# Patient Record
Sex: Female | Born: 1960 | Race: White | Hispanic: Yes | Marital: Married | Smoking: Never smoker
Health system: Southern US, Community
[De-identification: ages and names within clinical notes are randomized; demographics above are authoritative.]

## PROBLEM LIST (undated history)

## (undated) DIAGNOSIS — M51379 Other intervertebral disc degeneration, lumbosacral region without mention of lumbar back pain or lower extremity pain: Secondary | ICD-10-CM

## (undated) DIAGNOSIS — I10 Essential (primary) hypertension: Secondary | ICD-10-CM

## (undated) DIAGNOSIS — G43909 Migraine, unspecified, not intractable, without status migrainosus: Secondary | ICD-10-CM

## (undated) DIAGNOSIS — L570 Actinic keratosis: Secondary | ICD-10-CM

## (undated) DIAGNOSIS — N200 Calculus of kidney: Secondary | ICD-10-CM

## (undated) DIAGNOSIS — M810 Age-related osteoporosis without current pathological fracture: Secondary | ICD-10-CM

## (undated) DIAGNOSIS — R112 Nausea with vomiting, unspecified: Secondary | ICD-10-CM

## (undated) DIAGNOSIS — E559 Vitamin D deficiency, unspecified: Secondary | ICD-10-CM

## (undated) DIAGNOSIS — F419 Anxiety disorder, unspecified: Secondary | ICD-10-CM

## (undated) DIAGNOSIS — R519 Headache, unspecified: Secondary | ICD-10-CM

## (undated) DIAGNOSIS — E785 Hyperlipidemia, unspecified: Secondary | ICD-10-CM

## (undated) DIAGNOSIS — I499 Cardiac arrhythmia, unspecified: Secondary | ICD-10-CM

## (undated) DIAGNOSIS — N201 Calculus of ureter: Secondary | ICD-10-CM

## (undated) DIAGNOSIS — Z9889 Other specified postprocedural states: Secondary | ICD-10-CM

## (undated) DIAGNOSIS — I890 Lymphedema, not elsewhere classified: Secondary | ICD-10-CM

## (undated) DIAGNOSIS — C801 Malignant (primary) neoplasm, unspecified: Secondary | ICD-10-CM

## (undated) DIAGNOSIS — I872 Venous insufficiency (chronic) (peripheral): Secondary | ICD-10-CM

## (undated) DIAGNOSIS — T8859XA Other complications of anesthesia, initial encounter: Secondary | ICD-10-CM

## (undated) DIAGNOSIS — E538 Deficiency of other specified B group vitamins: Secondary | ICD-10-CM

## (undated) DIAGNOSIS — Z87442 Personal history of urinary calculi: Secondary | ICD-10-CM

## (undated) HISTORY — PX: AUGMENTATION MAMMAPLASTY: SUR837

## (undated) HISTORY — PX: EXTRACORPOREAL SHOCK WAVE LITHOTRIPSY: SHX1557

## (undated) HISTORY — PX: URETEROSCOPY WITH HOLMIUM LASER LITHOTRIPSY: SHX6645

## (undated) HISTORY — PX: FACIAL COSMETIC SURGERY: SHX629

## (undated) HISTORY — PX: LIPOSUCTION: SHX10

## (undated) HISTORY — PX: TONSILLECTOMY: SUR1361

## (undated) HISTORY — PX: PLACEMENT OF BREAST IMPLANTS: SHX6334

## (undated) HISTORY — DX: Calculus of kidney: N20.0

## (undated) HISTORY — PX: OTHER SURGICAL HISTORY: SHX169

## (undated) HISTORY — PX: CYSTOSCOPY/URETEROSCOPY/HOLMIUM LASER/STENT PLACEMENT: SHX6546

## (undated) HISTORY — DX: Actinic keratosis: L57.0

---

## 2005-02-08 DIAGNOSIS — M5137 Other intervertebral disc degeneration, lumbosacral region: Secondary | ICD-10-CM | POA: Insufficient documentation

## 2013-04-09 DIAGNOSIS — M810 Age-related osteoporosis without current pathological fracture: Secondary | ICD-10-CM | POA: Insufficient documentation

## 2013-04-09 DIAGNOSIS — E559 Vitamin D deficiency, unspecified: Secondary | ICD-10-CM | POA: Insufficient documentation

## 2019-02-25 DIAGNOSIS — G43901 Migraine, unspecified, not intractable, with status migrainosus: Secondary | ICD-10-CM | POA: Insufficient documentation

## 2020-07-09 ENCOUNTER — Other Ambulatory Visit: Payer: Self-pay

## 2020-07-09 ENCOUNTER — Ambulatory Visit
Admission: RE | Admit: 2020-07-09 | Discharge: 2020-07-09 | Disposition: A | Discharge status: Home / Self Care | Payer: 59 | Source: Ambulatory Visit | Attending: Urology | Admitting: Urology

## 2020-07-09 ENCOUNTER — Ambulatory Visit (INDEPENDENT_AMBULATORY_CARE_PROVIDER_SITE_OTHER): Payer: Managed Care, Other (non HMO) | Admitting: Urology

## 2020-07-09 ENCOUNTER — Encounter: Payer: Self-pay | Admitting: Urology

## 2020-07-09 VITALS — BP 170/90 | HR 89 | Ht 62.0 in | Wt 123.0 lb

## 2020-07-09 DIAGNOSIS — R1031 Right lower quadrant pain: Secondary | ICD-10-CM | POA: Diagnosis not present

## 2020-07-09 DIAGNOSIS — Z87442 Personal history of urinary calculi: Secondary | ICD-10-CM

## 2020-07-09 DIAGNOSIS — N132 Hydronephrosis with renal and ureteral calculous obstruction: Secondary | ICD-10-CM | POA: Diagnosis not present

## 2020-07-09 DIAGNOSIS — N200 Calculus of kidney: Secondary | ICD-10-CM | POA: Diagnosis not present

## 2020-07-09 NOTE — Progress Notes (Signed)
07/09/20 4:34 PM   Ashley Collins 10-08-1960 [id number]  CC: Nephrolithiasis  HPI: I saw Ashley Collins and her husband in urology clinic today for kidney stones.  She is a 60 year old female that has been followed extensively in [place] for recurrent stone disease.  She reportedly has collected numerous 24-hour urines that did not show any specific abnormalities.  She thinks her stone type has been calcium.  She has undergone both ureteroscopy and shockwave lithotripsy in the past, most recently right shockwave lithotripsy for distal ureteral stones in July 2021 in [place].  She has a CT with her today from August 2021 that shows persistent collection of right distal ureteral stones with upstream hydronephrosis.  She denies that she has had any treatment since that CT was performed, and has not sought care as she was between insurance coverage and recently moved to the area.  There are no labs available.  She reports moderate to severe pelvic pressure that has worsened over the last few weeks, and has become intolerable.  She denies any dysuria, flank pain, gross hematuria, or UTIs.  She denies any fevers or chills.  She continues to void.  Urinalysis today 6-10 WBCs, 11-30 RBCs, moderate bacteria, no yeast, nitrate negative, no leukocytes.Marland Kitchen   PMH: Past Medical History:  Diagnosis Date  . Kidney stones     Surgical History: Past Surgical History:  Procedure Laterality Date  . CESAREAN SECTION     x 4  . CYSTOSCOPY/URETEROSCOPY/HOLMIUM LASER/STENT PLACEMENT    . EXTRACORPOREAL SHOCK WAVE LITHOTRIPSY     x 10 plus  . Eye Lift    . FACIAL COSMETIC SURGERY    . LIPOSUCTION    . PLACEMENT OF BREAST IMPLANTS    . TONSILLECTOMY    . URETEROSCOPY WITH HOLMIUM LASER LITHOTRIPSY      Family History: Family History  Problem Relation Age of Onset  . Bladder Cancer Neg Hx   . Kidney cancer Neg Hx   . Prostate cancer Neg Hx     Social History:  reports that she has never smoked. She has  never used smokeless tobacco. No history on file for alcohol use and drug use.  Physical Exam: BP (!) 170/90   Pulse 89   Ht 5\' 2"  (1.575 m)   Wt 123 lb (55.8 kg)   BMI 22.50 kg/m    Constitutional:  Alert and oriented, No acute distress. Cardiovascular: No clubbing, cyanosis, or edema. Respiratory: Normal respiratory effort, no increased work of breathing. GI: Abdomen is soft, nontender, nondistended, no abdominal masses  Laboratory Data: See HPI  Pertinent Imaging: I have personally viewed and interpreted the CT from August 2021 from the outside hospital that shows right hydronephrosis and a collection of two 75mm stones in the right mid to distal ureter, no right renal stones, and punctate left renal stones with no hydronephrosis.  Stat CT today shows collection of right distal ureteral stones with upstream hydronephrosis, as well as a 5 mm left distal ureteral stone with no hydronephrosis, and a 6 mm left lower pole stone.  Assessment & Plan:   60 year old female with extensive history of nephrolithiasis, with most recent imaging in August 2021 showing two 24mm right mid to distal ureteral stones with upstream hydronephrosis.  She continues to be symptomatic with moderate to severe pelvic pressure.  I recommended repeating the CT today for evaluation of any change over the last 4 months, and she was in agreement.  CT today shows collection of right distal ureteral  stones with upstream hydronephrosis, as well as a 5 mm left distal ureteral stone with no hydronephrosis, and a 6 mm left lower pole stone.  I called her today after CT had resulted and discussed the findings of bilateral ureteral stones with right-sided hydronephrosis.  I recommended intervention tomorrow with bilateral ureteroscopy, laser lithotripsy, and stent placement.  We discussed possible need for temporary stent placement and delayed definitive treatment if there is any evidence of purulence or infection.  We  specifically discussed the risks ureteroscopy including bleeding, infection/sepsis, stent related symptoms including flank pain/urgency/frequency/incontinence/dysuria, ureteral injury, inability to access stone, or need for staged or additional procedures.  Add onto OR tomorrow for bilateral ureteroscopy, laser lithotripsy, stent placement  I spent 65 total minutes on the day of the encounter including pre-visit review of the medical record, face-to-face time with the patient, and post visit ordering of labs/imaging/tests.   Nickolas Madrid, MD 07/09/2020  [facility] [address] [place], [place] [address] [phone]

## 2020-07-09 NOTE — H&P (View-Only) (Signed)
 07/09/20 4:34 PM   Ashley Collins 03/24/1961 [id number]  CC: Nephrolithiasis  HPI: I saw Ashley Collins and her husband in urology clinic today for kidney stones.  She is a 59-year-old female that has been followed extensively in [place] for recurrent stone disease.  She reportedly has collected numerous 24-hour urines that did not show any specific abnormalities.  She thinks her stone type has been calcium.  She has undergone both ureteroscopy and shockwave lithotripsy in the past, most recently right shockwave lithotripsy for distal ureteral stones in July 2021 in [place].  She has a CT with her today from August 2021 that shows persistent collection of right distal ureteral stones with upstream hydronephrosis.  She denies that she has had any treatment since that CT was performed, and has not sought care as she was between insurance coverage and recently moved to the area.  There are no labs available.  She reports moderate to severe pelvic pressure that has worsened over the last few weeks, and has become intolerable.  She denies any dysuria, flank pain, gross hematuria, or UTIs.  She denies any fevers or chills.  She continues to void.  Urinalysis today 6-10 WBCs, 11-30 RBCs, moderate bacteria, no yeast, nitrate negative, no leukocytes..   PMH: Past Medical History:  Diagnosis Date  . Kidney stones     Surgical History: Past Surgical History:  Procedure Laterality Date  . CESAREAN SECTION     x 4  . CYSTOSCOPY/URETEROSCOPY/HOLMIUM LASER/STENT PLACEMENT    . EXTRACORPOREAL SHOCK WAVE LITHOTRIPSY     x 10 plus  . Eye Lift    . FACIAL COSMETIC SURGERY    . LIPOSUCTION    . PLACEMENT OF BREAST IMPLANTS    . TONSILLECTOMY    . URETEROSCOPY WITH HOLMIUM LASER LITHOTRIPSY      Family History: Family History  Problem Relation Age of Onset  . Bladder Cancer Neg Hx   . Kidney cancer Neg Hx   . Prostate cancer Neg Hx     Social History:  reports that she has never smoked. She has  never used smokeless tobacco. No history on file for alcohol use and drug use.  Physical Exam: BP (!) 170/90   Pulse 89   Ht 5' 2" (1.575 m)   Wt 123 lb (55.8 kg)   BMI 22.50 kg/m    Constitutional:  Alert and oriented, No acute distress. Cardiovascular: No clubbing, cyanosis, or edema. Respiratory: Normal respiratory effort, no increased work of breathing. GI: Abdomen is soft, nontender, nondistended, no abdominal masses  Laboratory Data: See HPI  Pertinent Imaging: I have personally viewed and interpreted the CT from August 2021 from the outside hospital that shows right hydronephrosis and a collection of two 6mm stones in the right mid to distal ureter, no right renal stones, and punctate left renal stones with no hydronephrosis.  Stat CT today shows collection of right distal ureteral stones with upstream hydronephrosis, as well as a 5 mm left distal ureteral stone with no hydronephrosis, and a 6 mm left lower pole stone.  Assessment & Plan:   59-year-old female with extensive history of nephrolithiasis, with most recent imaging in August 2021 showing two 6mm right mid to distal ureteral stones with upstream hydronephrosis.  She continues to be symptomatic with moderate to severe pelvic pressure.  I recommended repeating the CT today for evaluation of any change over the last 4 months, and she was in agreement.  CT today shows collection of right distal ureteral   stones with upstream hydronephrosis, as well as a 5 mm left distal ureteral stone with no hydronephrosis, and a 6 mm left lower pole stone.  I called her today after CT had resulted and discussed the findings of bilateral ureteral stones with right-sided hydronephrosis.  I recommended intervention tomorrow with bilateral ureteroscopy, laser lithotripsy, and stent placement.  We discussed possible need for temporary stent placement and delayed definitive treatment if there is any evidence of purulence or infection.  We  specifically discussed the risks ureteroscopy including bleeding, infection/sepsis, stent related symptoms including flank pain/urgency/frequency/incontinence/dysuria, ureteral injury, inability to access stone, or need for staged or additional procedures.  Add onto OR tomorrow for bilateral ureteroscopy, laser lithotripsy, stent placement  I spent 65 total minutes on the day of the encounter including pre-visit review of the medical record, face-to-face time with the patient, and post visit ordering of labs/imaging/tests.   Nickolas Madrid, MD 07/09/2020  [facility] [address] [place], [place] [address] [phone]

## 2020-07-10 ENCOUNTER — Ambulatory Visit: Payer: 59

## 2020-07-10 ENCOUNTER — Encounter
Admission: RE | Disposition: A | Discharge status: Home / Self Care | Payer: Self-pay | Source: Home / Self Care | Attending: Urology

## 2020-07-10 ENCOUNTER — Other Ambulatory Visit: Payer: Self-pay

## 2020-07-10 ENCOUNTER — Telehealth: Payer: Self-pay | Admitting: Urology

## 2020-07-10 ENCOUNTER — Ambulatory Visit
Admission: RE | Admit: 2020-07-10 | Discharge: 2020-07-10 | Disposition: A | Discharge status: Home / Self Care | Payer: 59 | Attending: Urology | Admitting: Urology

## 2020-07-10 ENCOUNTER — Ambulatory Visit: Payer: 59 | Admitting: Anesthesiology

## 2020-07-10 ENCOUNTER — Encounter: Payer: Self-pay | Admitting: Urology

## 2020-07-10 ENCOUNTER — Other Ambulatory Visit: Payer: Self-pay | Admitting: Urology

## 2020-07-10 ENCOUNTER — Other Ambulatory Visit
Admission: RE | Admit: 2020-07-10 | Discharge: 2020-07-10 | Disposition: A | Discharge status: Home / Self Care | Payer: 59 | Source: Ambulatory Visit | Attending: Urology | Admitting: Urology

## 2020-07-10 DIAGNOSIS — N132 Hydronephrosis with renal and ureteral calculous obstruction: Secondary | ICD-10-CM | POA: Diagnosis present

## 2020-07-10 DIAGNOSIS — Z20822 Contact with and (suspected) exposure to covid-19: Secondary | ICD-10-CM | POA: Insufficient documentation

## 2020-07-10 DIAGNOSIS — N200 Calculus of kidney: Secondary | ICD-10-CM | POA: Diagnosis not present

## 2020-07-10 DIAGNOSIS — Z87442 Personal history of urinary calculi: Secondary | ICD-10-CM | POA: Insufficient documentation

## 2020-07-10 DIAGNOSIS — Z01812 Encounter for preprocedural laboratory examination: Secondary | ICD-10-CM | POA: Insufficient documentation

## 2020-07-10 DIAGNOSIS — N201 Calculus of ureter: Secondary | ICD-10-CM | POA: Diagnosis not present

## 2020-07-10 HISTORY — PX: CYSTOSCOPY W/ RETROGRADES: SHX1426

## 2020-07-10 HISTORY — DX: Other complications of anesthesia, initial encounter: T88.59XA

## 2020-07-10 HISTORY — PX: CYSTOSCOPY/URETEROSCOPY/HOLMIUM LASER/STENT PLACEMENT: SHX6546

## 2020-07-10 LAB — URINALYSIS, COMPLETE
Bilirubin, UA: NEGATIVE
Glucose, UA: NEGATIVE
Ketones, UA: NEGATIVE
Leukocytes,UA: NEGATIVE
Nitrite, UA: NEGATIVE
Specific Gravity, UA: 1.025 (ref 1.005–1.030)
Urobilinogen, Ur: 0.2 mg/dL (ref 0.2–1.0)
pH, UA: 6.5 (ref 5.0–7.5)

## 2020-07-10 LAB — SARS CORONAVIRUS 2 BY RT PCR (HOSPITAL ORDER, PERFORMED IN ~~LOC~~ HOSPITAL LAB): SARS Coronavirus 2: NEGATIVE

## 2020-07-10 LAB — BASIC METABOLIC PANEL
Anion gap: 11 (ref 5–15)
BUN: 12 mg/dL (ref 8–23)
CO2: 27 mmol/L (ref 22–32)
Calcium: 9 mg/dL (ref 8.9–10.3)
Chloride: 103 mmol/L (ref 98–111)
Creatinine, Ser: 0.71 mg/dL (ref 0.44–1.00)
GFR, Estimated: >60 mL/min (ref >60)
Glucose, Bld: 111 mg/dL — ABNORMAL HIGH (ref 70–99)
Potassium: 3.6 mmol/L (ref 3.5–5.1)
Sodium: 141 mmol/L (ref 135–145)

## 2020-07-10 LAB — MICROSCOPIC EXAMINATION

## 2020-07-10 LAB — PARATHYROID HORMONE, INTRAOP (ARMC ONLY): Parathyroid Hormone: 83 pg/mL (ref 12–88)

## 2020-07-10 SURGERY — CYSTOSCOPY/URETEROSCOPY/HOLMIUM LASER/STENT PLACEMENT
Anesthesia: General | Site: Ureter | Laterality: Bilateral

## 2020-07-10 MED ORDER — MIDAZOLAM HCL 2 MG/2ML IJ SOLN
INTRAMUSCULAR | Status: AC
  Filled 2020-07-10: qty 2

## 2020-07-10 MED ORDER — CHLORHEXIDINE GLUCONATE 0.12 % MT SOLN
OROMUCOSAL | Status: AC
  Administered 2020-07-10: 15 mL via OROMUCOSAL
  Filled 2020-07-10: qty 15

## 2020-07-10 MED ORDER — PROPOFOL 500 MG/50ML IV EMUL
INTRAVENOUS | Status: DC | PRN
  Administered 2020-07-10: 175 ug/kg/min via INTRAVENOUS

## 2020-07-10 MED ORDER — PROPOFOL 500 MG/50ML IV EMUL
INTRAVENOUS | Status: AC
  Filled 2020-07-10: qty 50

## 2020-07-10 MED ORDER — DROPERIDOL 2.5 MG/ML IJ SOLN
0.6250 mg | Freq: Once | INTRAMUSCULAR | Status: DC | PRN
  Filled 2020-07-10: qty 2

## 2020-07-10 MED ORDER — FENTANYL CITRATE (PF) 100 MCG/2ML IJ SOLN
INTRAMUSCULAR | Status: DC | PRN
  Administered 2020-07-10: 50 ug via INTRAVENOUS

## 2020-07-10 MED ORDER — IOHEXOL 180 MG/ML  SOLN
INTRAMUSCULAR | Status: DC | PRN
  Administered 2020-07-10: 20 mL

## 2020-07-10 MED ORDER — LORAZEPAM 2 MG/ML IJ SOLN: 1.0000 mg | Freq: Once | INTRAMUSCULAR | Status: DC | PRN

## 2020-07-10 MED ORDER — MEPERIDINE HCL 50 MG/ML IJ SOLN: 6.2500 mg | INTRAMUSCULAR | Status: DC | PRN

## 2020-07-10 MED ORDER — PROPOFOL 10 MG/ML IV BOLUS
INTRAVENOUS | Status: DC | PRN
  Administered 2020-07-10: 160 mg via INTRAVENOUS
  Administered 2020-07-10: 20 mg via INTRAVENOUS

## 2020-07-10 MED ORDER — PROMETHAZINE HCL 25 MG/ML IJ SOLN: 6.2500 mg | INTRAMUSCULAR | Status: DC | PRN

## 2020-07-10 MED ORDER — HYDROMORPHONE HCL 1 MG/ML IJ SOLN: 0.2500 mg | INTRAMUSCULAR | Status: DC | PRN

## 2020-07-10 MED ORDER — LIDOCAINE HCL (CARDIAC) PF 100 MG/5ML IV SOSY
PREFILLED_SYRINGE | INTRAVENOUS | Status: DC | PRN
  Administered 2020-07-10: 100 mg via INTRAVENOUS

## 2020-07-10 MED ORDER — BELLADONNA ALKALOIDS-OPIUM 16.2-60 MG RE SUPP
RECTAL | Status: AC
  Filled 2020-07-10: qty 1

## 2020-07-10 MED ORDER — OXYBUTYNIN CHLORIDE ER 10 MG PO TB24: 10.0000 mg | ORAL_TABLET | Freq: Every day | ORAL | 0 refills | Status: AC | PRN

## 2020-07-10 MED ORDER — EPHEDRINE 5 MG/ML INJ
INTRAVENOUS | Status: AC
  Filled 2020-07-10: qty 10

## 2020-07-10 MED ORDER — CEFAZOLIN SODIUM-DEXTROSE 2-4 GM/100ML-% IV SOLN
INTRAVENOUS | Status: AC
  Filled 2020-07-10: qty 100

## 2020-07-10 MED ORDER — CHLORHEXIDINE GLUCONATE 0.12 % MT SOLN: 15.0000 mL | Freq: Once | OROMUCOSAL | Status: AC

## 2020-07-10 MED ORDER — DEXAMETHASONE SODIUM PHOSPHATE 10 MG/ML IJ SOLN
INTRAMUSCULAR | Status: DC | PRN
  Administered 2020-07-10: 10 mg via INTRAVENOUS

## 2020-07-10 MED ORDER — ONDANSETRON HCL 4 MG/2ML IJ SOLN
INTRAMUSCULAR | Status: DC | PRN
Start: 2020-07-10 — End: 2020-07-10
  Administered 2020-07-10: 4 mg via INTRAVENOUS

## 2020-07-10 MED ORDER — PHENYLEPHRINE HCL (PRESSORS) 10 MG/ML IV SOLN
INTRAVENOUS | Status: DC | PRN
  Administered 2020-07-10: 50 ug via INTRAVENOUS

## 2020-07-10 MED ORDER — ORAL CARE MOUTH RINSE: 15.0000 mL | Freq: Once | OROMUCOSAL | Status: AC

## 2020-07-10 MED ORDER — LIDOCAINE HCL URETHRAL/MUCOSAL 2 % EX GEL
CUTANEOUS | Status: AC
  Filled 2020-07-10: qty 10

## 2020-07-10 MED ORDER — ROCURONIUM BROMIDE 100 MG/10ML IV SOLN
INTRAVENOUS | Status: DC | PRN
  Administered 2020-07-10: 40 mg via INTRAVENOUS
  Administered 2020-07-10: 10 mg via INTRAVENOUS

## 2020-07-10 MED ORDER — HYDROCODONE-ACETAMINOPHEN 5-325 MG PO TABS: 1.0000 | ORAL_TABLET | ORAL | 0 refills | Status: AC | PRN

## 2020-07-10 MED ORDER — CEFAZOLIN SODIUM-DEXTROSE 2-4 GM/100ML-% IV SOLN
2.0000 g | INTRAVENOUS | Status: AC
  Administered 2020-07-10: 2 g via INTRAVENOUS

## 2020-07-10 MED ORDER — LACTATED RINGERS IV SOLN: INTRAVENOUS | Status: DC

## 2020-07-10 MED ORDER — SUCCINYLCHOLINE CHLORIDE 20 MG/ML IJ SOLN
INTRAMUSCULAR | Status: DC | PRN
  Administered 2020-07-10: 100 mg via INTRAVENOUS

## 2020-07-10 MED ORDER — FENTANYL CITRATE (PF) 100 MCG/2ML IJ SOLN
INTRAMUSCULAR | Status: AC
  Filled 2020-07-10: qty 2

## 2020-07-10 MED ORDER — SULFAMETHOXAZOLE-TRIMETHOPRIM 800-160 MG PO TABS: 1.0000 | ORAL_TABLET | Freq: Every day | ORAL | 0 refills | Status: DC

## 2020-07-10 MED ORDER — EPHEDRINE SULFATE 50 MG/ML IJ SOLN
INTRAMUSCULAR | Status: DC | PRN
  Administered 2020-07-10 (×2): 10 mg via INTRAVENOUS

## 2020-07-10 SURGICAL SUPPLY — 38 items
BAG DRAIN CYSTO-URO LG1000N (MISCELLANEOUS) ×2 IMPLANT
BASKET ZERO TIP 1.9FR (BASKET) ×2 IMPLANT
BRUSH SCRUB EZ 1% IODOPHOR (MISCELLANEOUS) ×2 IMPLANT
BSKT STON RTRVL ZERO TP 1.9FR (BASKET) ×1
CATH URETL 5X70 OPEN END (CATHETERS) IMPLANT
CNTNR SPEC 2.5X3XGRAD LEK (MISCELLANEOUS)
CONT SPEC 4OZ STER OR WHT (MISCELLANEOUS)
CONT SPEC 4OZ STRL OR WHT (MISCELLANEOUS)
CONTAINER SPEC 2.5X3XGRAD LEK (MISCELLANEOUS) IMPLANT
DRAPE UTILITY 15X26 TOWEL STRL (DRAPES) ×2 IMPLANT
DRSG TEGADERM 2-3/8X2-3/4 SM (GAUZE/BANDAGES/DRESSINGS) ×2 IMPLANT
GLIDEWIRE STR 0.035 150CM 3CM (WIRE) ×2 IMPLANT
GLOVE BIOGEL PI IND STRL 7.5 (GLOVE) ×1 IMPLANT
GLOVE BIOGEL PI INDICATOR 7.5 (GLOVE) ×1
GOWN STRL REUS W/ TWL LRG LVL3 (GOWN DISPOSABLE) ×1 IMPLANT
GOWN STRL REUS W/ TWL XL LVL3 (GOWN DISPOSABLE) ×1 IMPLANT
GOWN STRL REUS W/TWL LRG LVL3 (GOWN DISPOSABLE) ×2
GOWN STRL REUS W/TWL XL LVL3 (GOWN DISPOSABLE) ×2
GUIDEWIRE STR DUAL SENSOR (WIRE) ×2 IMPLANT
INFUSOR MANOMETER BAG 3000ML (MISCELLANEOUS) ×2 IMPLANT
INTRODUCER DILATOR DOUBLE (INTRODUCER) IMPLANT
KIT TURNOVER CYSTO (KITS) ×2 IMPLANT
PACK CYSTO AR (MISCELLANEOUS) ×2 IMPLANT
SCOPE LITHOVU DISP 9.5FR 7.7FR (UROLOGICAL SUPPLIES) ×1 IMPLANT
SCOPE LITHOVUE DISPOSABLE (UROLOGICAL SUPPLIES) ×2
SET CYSTO W/LG BORE CLAMP LF (SET/KITS/TRAYS/PACK) ×2 IMPLANT
SHEATH URETERAL 12FRX35CM (MISCELLANEOUS) IMPLANT
SOL .9 NS 3000ML IRR  AL (IV SOLUTION) ×1
SOL .9 NS 3000ML IRR AL (IV SOLUTION) ×1
SOL .9 NS 3000ML IRR UROMATIC (IV SOLUTION) ×1 IMPLANT
STENT URET 6FRX22 CONTOUR (STENTS) ×2 IMPLANT
STENT URET 6FRX24 CONTOUR (STENTS) ×2 IMPLANT
STENT URET 6FRX26 CONTOUR (STENTS) IMPLANT
SURGILUBE 2OZ TUBE FLIPTOP (MISCELLANEOUS) ×2 IMPLANT
SYR 10ML LL (SYRINGE) ×2 IMPLANT
TRACTIP FLEXIVA PULSE ID 200 (Laser) IMPLANT
VALVE UROSEAL ADJ ENDO (VALVE) IMPLANT
WATER STERILE IRR 1000ML POUR (IV SOLUTION) ×2 IMPLANT

## 2020-07-10 NOTE — Interval H&P Note (Signed)
UROLOGY H&P UPDATE  Agree with prior H&P dated 07/09/20.  Cardiac: RRR Lungs: CTA bilaterally  Laterality: Bilateral Procedure: Bilateral ureteroscopy, laser lithotripsy, stent placement  We specifically discussed the risks ureteroscopy including bleeding, infection/sepsis, stent related symptoms including flank pain/urgency/frequency/incontinence/dysuria, ureteral injury, inability to access stone, or need for staged or additional procedures.   Billey Co, MD 07/10/2020

## 2020-07-10 NOTE — Op Note (Signed)
Date of procedure: 07/10/20  Preoperative diagnosis:  1. Right distal ureteral stones 2. Right renal stones 3. Left distal ureteral stone  Postoperative diagnosis:  1. Same  Procedure: 1. Cystoscopy 2. Right ureteroscopy, laser lithotripsy and basket extraction of right distal ureteral stones 3. Right retrograde pyelogram with intraoperative interpretation, right ureteral stent placement 4. Left ureteroscopy, laser lithotripsy and basket extraction of left distal ureteral stones 5. Left ureteroscopy, laser lithotripsy of left renal stones 6. Left retrograde pyelogram with intraoperative interpretation, left ureteral stent placement with Dangler  Surgeon: Nickolas Madrid, MD  Anesthesia: General  Complications: None  Intraoperative findings:  1.  Multiple impacted right distal ureteral stones with significant ureteral edema, all fragmented and basket extracted, uncomplicated right ureteral stent placement 2.  Left distal ureteral stones fragmented and extracted, left renal stones dusted, left ureteral stent placed on Dangler 3.  Normal cystoscopy  EBL: Minimal  Specimens: Stone for analysis  Drains: Right 6 French by 22 cm ureteral stent, left 6 [place] by 24 cm ureteral stent on Dangler  Indication: Ashley Collins is a 60 y.o. patient with pelvic pressure who has had right distal ureteral stones on CT since at least August 2021, and CT last night confirmed right distal ureteral stones with hydronephrosis as well as a 5 mm left distal ureteral stone and left renal stone.  After reviewing the management options for treatment, they elected to proceed with the above surgical procedure(s). We have discussed the potential benefits and risks of the procedure, side effects of the proposed treatment, the likelihood of the patient achieving the goals of the procedure, and any potential problems that might occur during the procedure or recuperation. Informed consent has been  obtained.  Description of procedure:  The patient was taken to the operating room and general anesthesia was induced. SCDs were placed for DVT prophylaxis. The patient was placed in the dorsal lithotomy position, prepped and draped in the usual sterile fashion, and preoperative antibiotics(Ancef) were administered. A preoperative time-out was performed.   A 21 French rigid cystoscope was used to intubate the urethra and thorough cystoscopy was performed.  The bladder was grossly normal and the ureteral orifices were orthotopic bilaterally.  With the aid of an access catheter, I was ultimately able to navigate a sensor wire into the right ureteral orifice and alongside the multiple impacted right distal ureteral stones up into the kidney.  A semirigid ureteroscope was advanced alongside the wire and there was significant distal ureteral edema with three impacted yellow crystalline stones.  A 242 m laser fiber on settings of 0.8 J and 8 Hz was used to carefully fragment the stones, and all fragments were basket extracted.  This was sent for analysis.  Thorough inspection of the right ureter revealed no other fragments.  There was significant distal ureteral edema where the stone had been impacted.  A retrograde pyelogram showed no extravasation or filling defects.  The rigid cystoscope was backloaded over the wire and a 6 [place] by 22 cm ureteral stent was uneventfully placed with an excellent curl in the renal pelvis, as well as in the bladder.  I turned my attention to the left side and a sensor wire was able to be advanced alongside the left distal ureteral stones up into the left kidney.  A semirigid ureteroscope was advanced alongside the wire and identified 2 yellow stones in the left distal ureter.  These were fragmented into smaller pieces on previously mentioned the laser settings, and also basket extracted.  Thorough inspection of the left ureter showed no other stone fragments.  A second safety  wire was added through the semirigid scope.    A disposable ureteroscope was advanced over the wire up to the left kidney under fluoroscopic vision.  Thorough inspection of the kidney revealed a 79mm stone in the lower pole, and a 2 mm stone in the upper pole.  These were both fragmented to dust on settings of 0.5 J and 40 Hz.  Thorough pyeloscopy revealed no other fragments.  Contrast was injected from the proximal ureter and showed no extravasation or filling defects.  Careful pullback ureteroscopy demonstrated no residual fragments or ureteral injury.  The rigid cystoscope was again backloaded over the wire, and a 6 [place] by 24 cm ureteral stent was uneventfully placed with a Curl in the renal pelvis, as well as under direct vision the bladder.  The bladder was drained.  The left ureteral stent dangler was secured to the groin using Tegaderm.  Lido-jet was injected into the bladder, and a belladonna suppository was placed.  Disposition: Stable to PACU  Plan: Follow-up BMP and PTH, stone analysis Remove left ureteral stent on Dangler at home on 1/19 Right ureteral stent will be removed in clinic in 10 to 14 days secondary to significant ureteral edema from impacted stones  Nickolas Madrid, MD

## 2020-07-10 NOTE — Telephone Encounter (Signed)
-----   Message from Billey Co, MD sent at 07/10/2020  1:21 PM EST ----- Regarding: Stent removal Please schedule stent removal on 1/26 or 1/27, thanks  Nickolas Madrid, MD 07/10/2020

## 2020-07-10 NOTE — Anesthesia Preprocedure Evaluation (Signed)
Anesthesia Evaluation  Patient identified by MRN, date of birth, ID band Patient awake    Reviewed: Allergy & Precautions, H&P , NPO status , Patient's Chart, lab work & pertinent test results  Airway Mallampati: III       Dental no notable dental hx. (+) Teeth Intact   Pulmonary neg pulmonary ROS,    Pulmonary exam normal        Cardiovascular negative cardio ROS Normal cardiovascular exam     Neuro/Psych negative neurological ROS  negative psych ROS   GI/Hepatic negative GI ROS, Neg liver ROS,   Endo/Other  negative endocrine ROS  Renal/GU negative Renal ROS  negative genitourinary   Musculoskeletal negative musculoskeletal ROS (+)   Abdominal   Peds negative pediatric ROS (+)  Hematology negative hematology ROS (+)   Anesthesia Other Findings Past Medical History: No date: Kidney stones   Reproductive/Obstetrics negative OB ROS                             Anesthesia Physical Anesthesia Plan  ASA: II  Anesthesia Plan: General   Post-op Pain Management:    Induction: Intravenous  PONV Risk Score and Plan: 3 and Ondansetron and Dexamethasone  Airway Management Planned: Oral ETT  Additional Equipment:   Intra-op Plan:   Post-operative Plan:   Informed Consent: I have reviewed the patients History and Physical, chart, labs and discussed the procedure including the risks, benefits and alternatives for the proposed anesthesia with the patient or authorized representative who has indicated his/her understanding and acceptance.     Dental advisory given  Plan Discussed with: CRNA, Anesthesiologist and Surgeon  Anesthesia Plan Comments:         Anesthesia Quick Evaluation

## 2020-07-10 NOTE — Telephone Encounter (Signed)
App made Gave to [facility] in [place]

## 2020-07-10 NOTE — Anesthesia Postprocedure Evaluation (Signed)
Anesthesia Post Note  Patient: Ashley Collins  Procedure(s) Performed: CYSTOSCOPY/URETEROSCOPY/HOLMIUM LASER/STENT PLACEMENT (Bilateral Ureter) CYSTOSCOPY WITH RETROGRADE PYELOGRAM (Bilateral Ureter)  Patient location during evaluation: PACU Anesthesia Type: General Level of consciousness: awake Pain management: pain level controlled Vital Signs Assessment: post-procedure vital signs reviewed and stable Respiratory status: spontaneous breathing Cardiovascular status: stable Postop Assessment: no apparent nausea or vomiting Anesthetic complications: no   No complications documented.   Last Vitals:  Vitals:   07/10/20 1350 07/10/20 1405  BP: (!) 151/69 (!) 148/75  Pulse: 87 82  Resp: 11 12  Temp:    SpO2: 96% 98%    Last Pain:  Vitals:   07/10/20 1405  TempSrc:   PainSc: 0-No pain                 Neva Seat

## 2020-07-10 NOTE — Transfer of Care (Signed)
Immediate Anesthesia Transfer of Care Note  Patient: Ashley Collins  Procedure(s) Performed: CYSTOSCOPY/URETEROSCOPY/HOLMIUM LASER/STENT PLACEMENT (Bilateral Ureter) CYSTOSCOPY WITH RETROGRADE PYELOGRAM (Bilateral Ureter)  Patient Location: PACU  Anesthesia Type:General  Level of Consciousness: sedated  Airway & Oxygen Therapy: Patient Spontanous Breathing and Patient connected to face mask oxygen  Post-op Assessment: Report given to RN and Post -op Vital signs reviewed and stable  Post vital signs: Reviewed and stable  Last Vitals:  Vitals Value Taken Time  BP 165/82 07/10/20 1320  Temp 36.1 C 07/10/20 1320  Pulse 92 07/10/20 1327  Resp 14 07/10/20 1327  SpO2 99 % 07/10/20 1327  Vitals shown include unvalidated device data.  Last Pain:  Vitals:   07/10/20 1320  TempSrc:   PainSc: Asleep         Complications: No complications documented.

## 2020-07-10 NOTE — Anesthesia Procedure Notes (Signed)
Procedure Name: Intubation Date/Time: 07/10/2020 12:36 PM Performed by: Nelda Marseille, CRNA Pre-anesthesia Checklist: Patient identified, Patient being monitored, Timeout performed, Emergency Drugs available and Suction available Patient Re-evaluated:Patient Re-evaluated prior to induction Oxygen Delivery Method: Circle system utilized Preoxygenation: Pre-oxygenation with 100% oxygen Induction Type: IV induction Ventilation: Mask ventilation without difficulty Laryngoscope Size: Mac, 3 and McGraph Grade View: Grade I Tube type: Oral Tube size: 7.0 mm Number of attempts: 1 Airway Equipment and Method: Stylet and Video-laryngoscopy Placement Confirmation: ETT inserted through vocal cords under direct vision,  positive ETCO2 and breath sounds checked- equal and bilateral Secured at: 21 cm Tube secured with: Tape Dental Injury: Teeth and Oropharynx as per pre-operative assessment

## 2020-07-10 NOTE — Discharge Instructions (Signed)

## 2020-07-11 ENCOUNTER — Other Ambulatory Visit (INDEPENDENT_AMBULATORY_CARE_PROVIDER_SITE_OTHER): Payer: Self-pay

## 2020-07-14 ENCOUNTER — Other Ambulatory Visit: Payer: Self-pay | Admitting: Urology

## 2020-07-14 LAB — CULTURE, URINE COMPREHENSIVE

## 2020-07-14 MED ORDER — HYDROCODONE-ACETAMINOPHEN 5-325 MG PO TABS: 1.0000 | ORAL_TABLET | Freq: Four times a day (QID) | ORAL | 0 refills | Status: AC | PRN

## 2020-07-14 NOTE — Telephone Encounter (Signed)
Done. Please encourage her to use NSAIDs and tylenol and minimize narcotics for stent pain. She should continue to daily flomax, and use the oxybutynin as well  Nickolas Madrid, MD 07/14/2020

## 2020-07-15 LAB — CALCULI, WITH PHOTOGRAPH (CLINICAL LAB)
Calcium Oxalate Dihydrate: 80 %
Calcium Oxalate Monohydrate: 20 %
Weight Calculi: 4 mg

## 2020-07-22 ENCOUNTER — Encounter: Payer: Managed Care, Other (non HMO) | Admitting: Urology

## 2020-07-23 ENCOUNTER — Other Ambulatory Visit: Payer: Self-pay

## 2020-07-23 ENCOUNTER — Ambulatory Visit (INDEPENDENT_AMBULATORY_CARE_PROVIDER_SITE_OTHER): Payer: Managed Care, Other (non HMO) | Admitting: Urology

## 2020-07-23 ENCOUNTER — Encounter: Payer: Self-pay | Admitting: Urology

## 2020-07-23 VITALS — BP 131/78 | HR 75 | Ht 62.0 in | Wt 120.0 lb

## 2020-07-23 DIAGNOSIS — N200 Calculus of kidney: Secondary | ICD-10-CM

## 2020-07-23 DIAGNOSIS — Z87442 Personal history of urinary calculi: Secondary | ICD-10-CM

## 2020-07-23 DIAGNOSIS — Z466 Encounter for fitting and adjustment of urinary device: Secondary | ICD-10-CM | POA: Diagnosis not present

## 2020-07-23 MED ORDER — FLUCONAZOLE 100 MG PO TABS: 100.0000 mg | ORAL_TABLET | Freq: Every day | ORAL | 0 refills | Status: DC

## 2020-07-23 MED ORDER — CIPROFLOXACIN HCL 500 MG PO TABS: 500.0000 mg | ORAL_TABLET | Freq: Two times a day (BID) | ORAL | 0 refills | Status: DC

## 2020-07-23 MED ORDER — CIPROFLOXACIN HCL 500 MG PO TABS
500.0000 mg | ORAL_TABLET | Freq: Once | ORAL | Status: AC
  Administered 2020-07-23: 500 mg via ORAL

## 2020-07-23 NOTE — Progress Notes (Signed)
Cystoscopy Procedure Note:  Indication: Stent removal s/p 07/10/2020 bilateral ureteroscopy for bilateral distal ureteral and renal stones.  Right-sided distal ureteral stones had been present for at least 5 months.  She removed her left-sided stent on a Dangler last week.  She denies any dysuria, fevers, or UTI symptoms.  Has been on prophylactic Bactrim.  After informed consent and discussion of the procedure and its risks, Ashley Collins was positioned and prepped in the standard fashion. Cystoscopy was performed with a flexible cystoscope. The stent was grasped with flexible graspers and removed in its entirety. The patient tolerated the procedure well.  Findings: Uncomplicated stent removal  Assessment and Plan: Follow up in 4 weeks with renal ultrasound to evaluate for silent hydronephrosis Cipro 500 mg twice daily x3 days for equivocal urinalysis today, sent for culture  Prior 24 hr urine testing reportedly normal, consider K+ citrate for prevention at follow up   Billey Co, MD 07/23/2020

## 2020-07-23 NOTE — Patient Instructions (Signed)

## 2020-07-24 LAB — URINALYSIS, COMPLETE
Bilirubin, UA: NEGATIVE
Glucose, UA: NEGATIVE
Ketones, UA: NEGATIVE
Nitrite, UA: POSITIVE — AB
Specific Gravity, UA: 1.025 (ref 1.005–1.030)
Urobilinogen, Ur: 1 mg/dL (ref 0.2–1.0)
pH, UA: 6 (ref 5.0–7.5)

## 2020-07-24 LAB — MICROSCOPIC EXAMINATION: RBC, Urine: >30 /hpf — AB (ref 0–2)

## 2020-07-30 LAB — CULTURE, URINE COMPREHENSIVE

## 2020-08-03 DIAGNOSIS — M542 Cervicalgia: Secondary | ICD-10-CM | POA: Diagnosis not present

## 2020-08-03 DIAGNOSIS — M25512 Pain in left shoulder: Secondary | ICD-10-CM | POA: Diagnosis not present

## 2020-08-03 DIAGNOSIS — M7552 Bursitis of left shoulder: Secondary | ICD-10-CM | POA: Diagnosis not present

## 2020-08-03 DIAGNOSIS — M7542 Impingement syndrome of left shoulder: Secondary | ICD-10-CM | POA: Diagnosis not present

## 2020-08-04 ENCOUNTER — Telehealth: Payer: Self-pay

## 2020-08-04 NOTE — Telephone Encounter (Signed)
Called pt, no answer. LM for pt informing her of the information below. Advised pt to call back for questions or concerns.

## 2020-08-04 NOTE — Telephone Encounter (Signed)
-----   Message from Billey Co, MD sent at 08/03/2020 11:21 AM EST ----- No UTI on recent urine culture, keep follow up as scheduled  Nickolas Madrid, MD 08/03/2020

## 2020-08-06 DIAGNOSIS — R0600 Dyspnea, unspecified: Secondary | ICD-10-CM | POA: Diagnosis not present

## 2020-08-06 DIAGNOSIS — R030 Elevated blood-pressure reading, without diagnosis of hypertension: Secondary | ICD-10-CM | POA: Diagnosis not present

## 2020-08-06 DIAGNOSIS — I499 Cardiac arrhythmia, unspecified: Secondary | ICD-10-CM | POA: Diagnosis not present

## 2020-08-06 DIAGNOSIS — R9431 Abnormal electrocardiogram [ECG] [EKG]: Secondary | ICD-10-CM | POA: Diagnosis not present

## 2020-08-06 DIAGNOSIS — Z23 Encounter for immunization: Secondary | ICD-10-CM | POA: Diagnosis not present

## 2020-08-06 DIAGNOSIS — Z8679 Personal history of other diseases of the circulatory system: Secondary | ICD-10-CM | POA: Diagnosis not present

## 2020-08-13 DIAGNOSIS — M542 Cervicalgia: Secondary | ICD-10-CM | POA: Diagnosis not present

## 2020-08-20 DIAGNOSIS — M542 Cervicalgia: Secondary | ICD-10-CM | POA: Diagnosis not present

## 2020-08-24 ENCOUNTER — Other Ambulatory Visit: Payer: Self-pay

## 2020-08-24 ENCOUNTER — Ambulatory Visit
Admission: RE | Admit: 2020-08-24 | Discharge: 2020-08-24 | Disposition: A | Discharge status: Home / Self Care | Payer: 59 | Source: Ambulatory Visit | Attending: Urology | Admitting: Urology

## 2020-08-24 DIAGNOSIS — N1330 Unspecified hydronephrosis: Secondary | ICD-10-CM | POA: Diagnosis not present

## 2020-08-24 DIAGNOSIS — N200 Calculus of kidney: Secondary | ICD-10-CM | POA: Diagnosis not present

## 2020-08-24 DIAGNOSIS — Z87442 Personal history of urinary calculi: Secondary | ICD-10-CM | POA: Insufficient documentation

## 2020-08-25 ENCOUNTER — Other Ambulatory Visit: Payer: Self-pay | Admitting: Obstetrics and Gynecology

## 2020-08-25 DIAGNOSIS — Z01419 Encounter for gynecological examination (general) (routine) without abnormal findings: Secondary | ICD-10-CM | POA: Diagnosis not present

## 2020-08-25 DIAGNOSIS — Z1231 Encounter for screening mammogram for malignant neoplasm of breast: Secondary | ICD-10-CM | POA: Diagnosis not present

## 2020-08-25 DIAGNOSIS — M542 Cervicalgia: Secondary | ICD-10-CM | POA: Diagnosis not present

## 2020-08-27 ENCOUNTER — Ambulatory Visit: Payer: Managed Care, Other (non HMO) | Admitting: Urology

## 2020-08-27 ENCOUNTER — Other Ambulatory Visit: Payer: Self-pay

## 2020-08-27 ENCOUNTER — Encounter: Payer: Self-pay | Admitting: Urology

## 2020-08-27 VITALS — BP 158/105 | HR 87 | Ht 62.0 in | Wt 125.0 lb

## 2020-08-27 DIAGNOSIS — Z87442 Personal history of urinary calculi: Secondary | ICD-10-CM | POA: Diagnosis not present

## 2020-08-27 DIAGNOSIS — M542 Cervicalgia: Secondary | ICD-10-CM | POA: Diagnosis not present

## 2020-08-27 DIAGNOSIS — N200 Calculus of kidney: Secondary | ICD-10-CM | POA: Diagnosis not present

## 2020-08-27 NOTE — Progress Notes (Signed)
   08/27/2020 4:05 PM   [address] 1960/11/15 [id number]  Reason for visit: Follow up recurrent nephrolithiasis  HPI: I saw Ms. Sebring back in clinic for follow-up of recurrent calcium oxalate nephrolithiasis.  She is a 60 year old female who recently moved to the area and has a very long history of recurrent stone disease.  She underwent bilateral ureteroscopy on 07/10/2020 for multiple impacted right ureteral stones that have been present at least 4 to 5 months, as well as small left distal ureteral stones and left renal stones.  Her stents have since been removed and she is doing well with no flank pain or urinary complaints.  A follow-up renal ultrasound showed mild right hydronephrosis, but no other significant abnormalities, which is not surprising with her history of obstruction for 4 to 5 months prior to definitive treatment.  I recommended repeating a renal ultrasound to confirm resolution or at least stability of the mild right-sided hydronephrosis. We discussed general stone prevention strategies including adequate hydration with goal of producing 2.5 L of urine daily, increasing citric acid intake, increasing calcium intake during high oxalate meals, minimizing animal protein, and decreasing salt intake. Information about dietary recommendations given today.  I also recommended a 24-hour urine test, and she is amenable to completing this.  -Repeat renal ultrasound in 6 weeks, and 24-hour urine test, will call with those results -Consider potassium citrate pending above findings for stone prevention, likely KUB at least every 6 months for surveillance  Billey Co, MD  [place] [address], [place], [place] [address] [phone]

## 2020-08-27 NOTE — Patient Instructions (Signed)
[facility] Instructions [facility]   You will receive a box/kit in the mail that will have a urine jug and instructions in the kit.  When the box arrives you will need to call our office [phone] to schedule a LAB appointment.   You will need to do a 24hour urine and this should be done during the days that our office will be open.  For example any day from Sunday through Thursday.   If you take Vitamin C 100mg or greater please stop this 5 days prior to collection.   How to collect the urine sample: On the day you start the urine sample this 1st morning urine should NOT be collected.  For the rest of the day including all night urines should be collected.  On the next morning the 1st urine should be collected and then you will be finished with the urine collections.   You will need to bring the box with you on your LAB appointment day after urine has been collected and all instructions are complete in the box.  Your blood will be drawn and the box will be collected by our Lab employee to be sent off for analysis.   When urine and blood is complete you will need to schedule a follow up appointment for lab results.  Dietary Guidelines to Help Prevent Kidney Stones Kidney stones are deposits of minerals and salts that form inside your kidneys. Your risk of developing kidney stones may be greater depending on your diet, your lifestyle, the medicines you take, and whether you have certain medical conditions. Most people can lower their chances of developing kidney stones by following the instructions below. Your dietitian may give you more specific instructions depending on your overall health and the type of kidney stones you tend to develop. What are tips for following this plan? Reading food labels  Choose foods with "no salt added" or "low-salt" labels. Limit your salt (sodium) intake to less than 1,500 mg a day.  Choose foods with calcium for each meal and snack. Try  to eat about 300 mg of calcium at each meal. Foods that contain 200-500 mg of calcium a serving include: ? 8 oz (237 mL) of milk, calcium-fortifiednon-dairy milk, and calcium-fortifiedfruit juice. Calcium-fortified means that calcium has been added to these drinks. ? 8 oz (237 mL) of kefir, yogurt, and soy yogurt. ? 4 oz (114 g) of tofu. ? 1 oz (28 g) of cheese. ? 1 cup (150 g) of dried figs. ? 1 cup (91 g) of cooked broccoli. ? One 3 oz (85 g) can of sardines or mackerel. Most people need 1,000-1,500 mg of calcium a day. Talk to your dietitian about how much calcium is recommended for you.   Shopping  Buy plenty of fresh fruits and vegetables. Most people do not need to avoid fruits and vegetables, even if these foods contain nutrients that may contribute to kidney stones.  When shopping for convenience foods, choose: ? Whole pieces of fruit. ? Pre-made salads with dressing on the side. ? Low-fat fruit and yogurt smoothies.  Avoid buying frozen meals or prepared deli foods. These can be high in sodium.  Look for foods with live cultures, such as yogurt and kefir.  Choose high-fiber grains, such as whole-wheat breads, oat bran, and wheat cereals. Cooking  Do not add salt to food when cooking. Place a salt shaker on the table and allow each person to add his or her own salt to taste.    Use vegetable protein, such as beans, textured vegetable protein (TVP), or tofu, instead of meat in pasta, casseroles, and soups. Meal planning  Eat less salt, if told by your dietitian. To do this: ? Avoid eating processed or pre-made food. ? Avoid eating fast food.  Eat less animal protein, including cheese, meat, poultry, or fish, if told by your dietitian. To do this: ? Limit the number of times you have meat, poultry, fish, or cheese each week. Eat a diet free of meat at least 2 days a week. ? Eat only one serving each day of meat, poultry, fish, or seafood. ? When you prepare animal protein,  cut pieces into small portion sizes. For most meat and fish, one serving is about the size of the palm of your hand.  Eat at least five servings of fresh fruits and vegetables each day. To do this: ? Keep fruits and vegetables on hand for snacks. ? Eat one piece of fruit or a handful of berries with breakfast. ? Have a salad and fruit at lunch. ? Have two kinds of vegetables at dinner.  Limit foods that are high in a substance called oxalate. These include: ? Spinach (cooked), rhubarb, beets, sweet potatoes, and Swiss chard. ? Peanuts. ? Potato chips, french fries, and baked potatoes with skin on. ? Nuts and nut products. ? Chocolate.  If you regularly take a diuretic medicine, make sure to eat at least 1 or 2 servings of fruits or vegetables that are high in potassium each day. These include: ? Avocado. ? Banana. ? Orange, prune, carrot, or tomato juice. ? Baked potato. ? Cabbage. ? Beans and split peas. Lifestyle  Drink enough fluid to keep your urine pale yellow. This is the most important thing you can do. Spread your fluid intake throughout the day.  If you drink alcohol: ? Limit how much you use to:  0-1 drink a day for women who are not pregnant.  0-2 drinks a day for men. ? Be aware of how much alcohol is in your drink. In the [place] one drink equals one 12 oz bottle of beer (355 mL), one 5 oz glass of wine (148 mL), or one 1 oz glass of hard liquor (44 mL).  Lose weight if told by your health care provider. Work with your dietitian to find an eating plan and weight loss strategies that work best for you.   General information  Talk to your health care provider and dietitian about taking daily supplements. You may be told the following depending on your health and the cause of your kidney stones: ? Not to take supplements with vitamin C. ? To take a calcium supplement. ? To take a daily probiotic supplement. ? To take other supplements such as magnesium, fish oil, or  vitamin B6.  Take over-the-counter and prescription medicines only as told by your health care provider. These include supplements. What foods should I limit? Limit your intake of the following foods, or eat them as told by your dietitian. Vegetables Spinach. Rhubarb. Beets. Canned vegetables. Pickles. Olives. Baked potatoes with skin. Grains Wheat bran. Baked goods. Salted crackers. Cereals high in sugar. Meats and other proteins Nuts. Nut butters. Large portions of meat, poultry, or fish. Salted, precooked, or cured meats, such as sausages, meat loaves, and hot dogs. Dairy Cheese. Beverages Regular soft drinks. Regular vegetable juice. Seasonings and condiments Seasoning blends with salt. Salad dressings. Soy sauce. Ketchup. Barbecue sauce. Other foods Canned soups. Canned pasta sauce. Casseroles. Pizza.   Lasagna. Frozen meals. Potato chips. French fries. The items listed above may not be a complete list of foods and beverages you should limit. Contact a dietitian for more information. What foods should I avoid? Talk to your dietitian about specific foods you should avoid based on the type of kidney stones you have and your overall health. Fruits Grapefruit. The item listed above may not be a complete list of foods and beverages you should avoid. Contact a dietitian for more information. Summary  Kidney stones are deposits of minerals and salts that form inside your kidneys.  You can lower your risk of kidney stones by making changes to your diet.  The most important thing you can do is drink enough fluid. Drink enough fluid to keep your urine pale yellow.  Talk to your dietitian about how much calcium you should have each day, and eat less salt and animal protein as told by your dietitian. This information is not intended to replace advice given to you by your health care provider. Make sure you discuss any questions you have with your health care provider. Document Revised:  06/06/2019 Document Reviewed: 06/06/2019 Elsevier Patient Education  2021 [employer]vier Inc.  

## 2020-08-31 DIAGNOSIS — M542 Cervicalgia: Secondary | ICD-10-CM | POA: Diagnosis not present

## 2020-09-03 DIAGNOSIS — M542 Cervicalgia: Secondary | ICD-10-CM | POA: Diagnosis not present

## 2020-09-04 DIAGNOSIS — G8929 Other chronic pain: Secondary | ICD-10-CM | POA: Diagnosis not present

## 2020-09-04 DIAGNOSIS — M62838 Other muscle spasm: Secondary | ICD-10-CM | POA: Diagnosis not present

## 2020-09-04 DIAGNOSIS — M542 Cervicalgia: Secondary | ICD-10-CM | POA: Diagnosis not present

## 2020-09-04 DIAGNOSIS — M25512 Pain in left shoulder: Secondary | ICD-10-CM | POA: Diagnosis not present

## 2020-09-15 DIAGNOSIS — R0600 Dyspnea, unspecified: Secondary | ICD-10-CM | POA: Diagnosis not present

## 2020-09-15 DIAGNOSIS — Z8679 Personal history of other diseases of the circulatory system: Secondary | ICD-10-CM | POA: Diagnosis not present

## 2020-09-15 DIAGNOSIS — R0789 Other chest pain: Secondary | ICD-10-CM | POA: Diagnosis not present

## 2020-09-15 DIAGNOSIS — R030 Elevated blood-pressure reading, without diagnosis of hypertension: Secondary | ICD-10-CM | POA: Diagnosis not present

## 2020-09-16 DIAGNOSIS — R9431 Abnormal electrocardiogram [ECG] [EKG]: Secondary | ICD-10-CM | POA: Diagnosis not present

## 2020-09-17 DIAGNOSIS — Z8249 Family history of ischemic heart disease and other diseases of the circulatory system: Secondary | ICD-10-CM | POA: Diagnosis not present

## 2020-09-17 DIAGNOSIS — I10 Essential (primary) hypertension: Secondary | ICD-10-CM | POA: Diagnosis not present

## 2020-09-17 DIAGNOSIS — R0600 Dyspnea, unspecified: Secondary | ICD-10-CM | POA: Diagnosis not present

## 2020-09-17 DIAGNOSIS — Z23 Encounter for immunization: Secondary | ICD-10-CM | POA: Diagnosis not present

## 2020-09-23 ENCOUNTER — Other Ambulatory Visit (HOSPITAL_COMMUNITY): Payer: Self-pay | Admitting: Sports Medicine

## 2020-09-23 ENCOUNTER — Other Ambulatory Visit: Payer: Self-pay | Admitting: Sports Medicine

## 2020-09-23 DIAGNOSIS — G8929 Other chronic pain: Secondary | ICD-10-CM

## 2020-09-23 DIAGNOSIS — M542 Cervicalgia: Secondary | ICD-10-CM

## 2020-10-26 ENCOUNTER — Other Ambulatory Visit: Payer: Self-pay

## 2020-10-26 ENCOUNTER — Ambulatory Visit
Admission: RE | Admit: 2020-10-26 | Discharge: 2020-10-26 | Disposition: A | Discharge status: Home / Self Care | Payer: 59 | Source: Ambulatory Visit | Attending: Urology | Admitting: Urology

## 2020-10-26 DIAGNOSIS — R9431 Abnormal electrocardiogram [ECG] [EKG]: Secondary | ICD-10-CM | POA: Diagnosis not present

## 2020-10-26 DIAGNOSIS — I10 Essential (primary) hypertension: Secondary | ICD-10-CM | POA: Diagnosis not present

## 2020-10-26 DIAGNOSIS — Z87442 Personal history of urinary calculi: Secondary | ICD-10-CM | POA: Diagnosis not present

## 2020-10-26 DIAGNOSIS — N1330 Unspecified hydronephrosis: Secondary | ICD-10-CM | POA: Diagnosis not present

## 2020-10-26 DIAGNOSIS — G43109 Migraine with aura, not intractable, without status migrainosus: Secondary | ICD-10-CM | POA: Diagnosis not present

## 2020-10-26 DIAGNOSIS — Z780 Asymptomatic menopausal state: Secondary | ICD-10-CM | POA: Diagnosis not present

## 2020-10-26 DIAGNOSIS — N200 Calculus of kidney: Secondary | ICD-10-CM | POA: Diagnosis not present

## 2020-10-29 ENCOUNTER — Ambulatory Visit
Admission: RE | Admit: 2020-10-29 | Discharge: 2020-10-29 | Disposition: A | Discharge status: Home / Self Care | Payer: 59 | Source: Ambulatory Visit | Attending: Obstetrics and Gynecology | Admitting: Obstetrics and Gynecology

## 2020-10-29 ENCOUNTER — Other Ambulatory Visit: Payer: Self-pay

## 2020-10-29 DIAGNOSIS — Z1231 Encounter for screening mammogram for malignant neoplasm of breast: Secondary | ICD-10-CM | POA: Diagnosis not present

## 2020-11-03 ENCOUNTER — Ambulatory Visit
Admission: RE | Admit: 2020-11-03 | Discharge: 2020-11-03 | Disposition: A | Discharge status: Home / Self Care | Payer: 59 | Source: Ambulatory Visit | Attending: Sports Medicine | Admitting: Sports Medicine

## 2020-11-03 ENCOUNTER — Other Ambulatory Visit: Payer: Self-pay

## 2020-11-03 DIAGNOSIS — G43109 Migraine with aura, not intractable, without status migrainosus: Secondary | ICD-10-CM | POA: Insufficient documentation

## 2020-11-03 DIAGNOSIS — G8929 Other chronic pain: Secondary | ICD-10-CM | POA: Insufficient documentation

## 2020-11-03 DIAGNOSIS — M542 Cervicalgia: Secondary | ICD-10-CM | POA: Insufficient documentation

## 2020-11-03 DIAGNOSIS — M25512 Pain in left shoulder: Secondary | ICD-10-CM | POA: Insufficient documentation

## 2020-11-04 ENCOUNTER — Other Ambulatory Visit: Payer: Self-pay

## 2020-11-04 DIAGNOSIS — Z87442 Personal history of urinary calculi: Secondary | ICD-10-CM

## 2020-11-04 NOTE — Telephone Encounter (Signed)
Ultrasound overall looks good-did she complete the 24-hour urine test?  Would still recommend completing the 24-hour urine.  Ultrasound tends to overestimate stone size so we will continue to watch that smaller stone.    RTC 6 months with renal ultrasound and KUB prior  Nickolas Madrid, MD 11/04/2020

## 2020-11-09 ENCOUNTER — Other Ambulatory Visit: Payer: Self-pay

## 2020-11-09 ENCOUNTER — Other Ambulatory Visit: Payer: 59

## 2020-11-09 DIAGNOSIS — N200 Calculus of kidney: Secondary | ICD-10-CM | POA: Diagnosis not present

## 2020-11-16 ENCOUNTER — Other Ambulatory Visit: Payer: Self-pay | Admitting: Urology

## 2020-11-16 DIAGNOSIS — M5020 Other cervical disc displacement, unspecified cervical region: Secondary | ICD-10-CM | POA: Diagnosis not present

## 2020-11-16 DIAGNOSIS — M5412 Radiculopathy, cervical region: Secondary | ICD-10-CM | POA: Diagnosis not present

## 2020-12-14 DIAGNOSIS — M5412 Radiculopathy, cervical region: Secondary | ICD-10-CM | POA: Diagnosis not present

## 2020-12-14 DIAGNOSIS — M5020 Other cervical disc displacement, unspecified cervical region: Secondary | ICD-10-CM | POA: Diagnosis not present

## 2020-12-22 DIAGNOSIS — M5020 Other cervical disc displacement, unspecified cervical region: Secondary | ICD-10-CM | POA: Diagnosis not present

## 2020-12-22 DIAGNOSIS — M5412 Radiculopathy, cervical region: Secondary | ICD-10-CM | POA: Diagnosis not present

## 2021-01-06 DIAGNOSIS — R29818 Other symptoms and signs involving the nervous system: Secondary | ICD-10-CM | POA: Diagnosis not present

## 2021-01-21 DIAGNOSIS — M62830 Muscle spasm of back: Secondary | ICD-10-CM | POA: Diagnosis not present

## 2021-01-21 DIAGNOSIS — M5020 Other cervical disc displacement, unspecified cervical region: Secondary | ICD-10-CM | POA: Diagnosis not present

## 2021-01-21 DIAGNOSIS — M5412 Radiculopathy, cervical region: Secondary | ICD-10-CM | POA: Diagnosis not present

## 2021-03-17 ENCOUNTER — Ambulatory Visit: Payer: Self-pay | Admitting: Urology

## 2021-03-24 ENCOUNTER — Ambulatory Visit
Admission: RE | Admit: 2021-03-24 | Discharge: 2021-03-24 | Disposition: A | Discharge status: Home / Self Care | Payer: 59 | Source: Ambulatory Visit | Attending: Urology | Admitting: Urology

## 2021-03-24 ENCOUNTER — Other Ambulatory Visit: Payer: Self-pay

## 2021-03-24 DIAGNOSIS — Z87442 Personal history of urinary calculi: Secondary | ICD-10-CM

## 2021-03-24 DIAGNOSIS — N200 Calculus of kidney: Secondary | ICD-10-CM | POA: Diagnosis not present

## 2021-03-24 DIAGNOSIS — R109 Unspecified abdominal pain: Secondary | ICD-10-CM | POA: Diagnosis not present

## 2021-03-24 DIAGNOSIS — N1330 Unspecified hydronephrosis: Secondary | ICD-10-CM | POA: Diagnosis not present

## 2021-03-25 ENCOUNTER — Ambulatory Visit: Payer: Self-pay | Admitting: Urology

## 2021-03-25 ENCOUNTER — Ambulatory Visit (INDEPENDENT_AMBULATORY_CARE_PROVIDER_SITE_OTHER): Payer: 59 | Admitting: Urology

## 2021-03-25 ENCOUNTER — Encounter: Payer: Self-pay | Admitting: Urology

## 2021-03-25 VITALS — BP 107/76 | HR 76 | Ht 62.0 in | Wt 120.0 lb

## 2021-03-25 DIAGNOSIS — N200 Calculus of kidney: Secondary | ICD-10-CM

## 2021-03-25 MED ORDER — INDAPAMIDE 2.5 MG PO TABS: 2.5000 mg | ORAL_TABLET | Freq: Every day | ORAL | 11 refills | Status: DC

## 2021-03-25 NOTE — Progress Notes (Signed)
   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  She underwent bilateral ureteroscopy on 07/10/2020 for multiple impacted right ureteral stones that had been present at least 4 to 5 months, as well as small left distal ureteral stones and left renal stones.  A follow-up renal ultrasound in May 2022 showed mild right hydronephrosis, but no other significant abnormalities, which is not surprising with her history of obstruction for 4 to 5 months prior to definitive treatment.  Overall she has been doing well.  She denies any stone episodes since her last visit.  She has had a few twinges of left-sided discomfort, but she recently started a new job that requires some lifting and she is unsure if that is related.  She denies any gross hematuria.  She occasionally has some split urinary stream.  I personally reviewed and interpreted her KUB today that shows no obvious evidence of stone disease over the ureters, possible left renal stone.  Ultrasound with possible left-sided dilation.  Formal radiology read pending.  Stone type was calcium oxalate.  She completed a 24-hour urine that was notable for very low urine volume of 0.95 L, elevated urine calcium of 224, normal urine sodium, excellent urine citrate of 775, normal urine oxalate, pH 6.6.  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  Risks and benefits discussed.  Trial of indapamide 2.5 mg daily for hypercalciuria, consider repeat 24-hour urine in the next 6 to 12 months Will call with final renal ultrasound report, if normal RTC 1 year with [place], [place] [address] [place],  [address] [phone]

## 2021-03-25 NOTE — Patient Instructions (Signed)
Dietary Guidelines to Help Prevent Kidney Stones Kidney stones are deposits of minerals and salts that form inside your kidneys. Your risk of developing kidney stones may be greater depending on your diet, your lifestyle, the medicines you take, and whether you have certain medical conditions. Most people can lower their chances of developing kidney stones by following the instructions below. Your dietitian may give you more specific instructions depending on your overall health and the type of kidney stones you tend to develop. What are tips for following this plan? Reading food labels  Choose foods with "no salt added" or "low-salt" labels. Limit your salt (sodium) intake to less than 1,500 mg a day. Choose foods with calcium for each meal and snack. Try to eat about 300 mg of calcium at each meal. Foods that contain 200-500 mg of calcium a serving include: 8 oz (237 mL) of milk, calcium-fortifiednon-dairy milk, and calcium-fortifiedfruit juice. Calcium-fortified means that calcium has been added to these drinks. 8 oz (237 mL) of kefir, yogurt, and soy yogurt. 4 oz (114 g) of tofu. 1 oz (28 g) of cheese. 1 cup (150 g) of dried figs. 1 cup (91 g) of cooked broccoli. One 3 oz (85 g) can of sardines or mackerel. Most people need 1,000-1,500 mg of calcium a day. Talk to your dietitian about how much calcium is recommended for you. Shopping Buy plenty of fresh fruits and vegetables. Most people do not need to avoid fruits and vegetables, even if these foods contain nutrients that may contribute to kidney stones. When shopping for convenience foods, choose: Whole pieces of fruit. Pre-made salads with dressing on the side. Low-fat fruit and yogurt smoothies. Avoid buying frozen meals or prepared deli foods. These can be high in sodium. Look for foods with live cultures, such as yogurt and kefir. Choose high-fiber grains, such as whole-wheat breads, oat bran, and wheat cereals. Cooking Do not add  salt to food when cooking. Place a salt shaker on the table and allow each person to add his or her own salt to taste. Use vegetable protein, such as beans, textured vegetable protein (TVP), or tofu, instead of meat in pasta, casseroles, and soups. Meal planning Eat less salt, if told by your dietitian. To do this: Avoid eating processed or pre-made food. Avoid eating fast food. Eat less animal protein, including cheese, meat, poultry, or fish, if told by your dietitian. To do this: Limit the number of times you have meat, poultry, fish, or cheese each week. Eat a diet free of meat at least 2 days a week. Eat only one serving each day of meat, poultry, fish, or seafood. When you prepare animal protein, cut pieces into small portion sizes. For most meat and fish, one serving is about the size of the palm of your hand. Eat at least five servings of fresh fruits and vegetables each day. To do this: Keep fruits and vegetables on hand for snacks. Eat one piece of fruit or a handful of berries with breakfast. Have a salad and fruit at lunch. Have two kinds of vegetables at dinner. Limit foods that are high in a substance called oxalate. These include: Spinach (cooked), rhubarb, beets, sweet potatoes, and Swiss chard. Peanuts. Potato chips, french fries, and baked potatoes with skin on. Nuts and nut products. Chocolate. If you regularly take a diuretic medicine, make sure to eat at least 1 or 2 servings of fruits or vegetables that are high in potassium each day. These include: Avocado. Banana. Orange, prune,   carrot, or tomato juice. Baked potato. Cabbage. Beans and split peas. Lifestyle  Drink enough fluid to keep your urine pale yellow. This is the most important thing you can do. Spread your fluid intake throughout the day. If you drink alcohol: Limit how much you use to: 0-1 drink a day for women who are not pregnant. 0-2 drinks a day for men. Be aware of how much alcohol is in your  drink. In the [place] one drink equals one 12 oz bottle of beer (355 mL), one 5 oz glass of wine (148 mL), or one 1 oz glass of hard liquor (44 mL). Lose weight if told by your health care provider. Work with your dietitian to find an eating plan and weight loss strategies that work best for you. General information Talk to your health care provider and dietitian about taking daily supplements. You may be told the following depending on your health and the cause of your kidney stones: Not to take supplements with vitamin C. To take a calcium supplement. To take a daily probiotic supplement. To take other supplements such as magnesium, fish oil, or vitamin B6. Take over-the-counter and prescription medicines only as told by your health care provider. These include supplements. What foods should I limit? Limit your intake of the following foods, or eat them as told by your dietitian. Vegetables Spinach. Rhubarb. Beets. Canned vegetables. Pickles. Olives. Baked potatoes with skin. Grains Wheat bran. Baked goods. Salted crackers. Cereals high in sugar. Meats and other proteins Nuts. Nut butters. Large portions of meat, poultry, or fish. Salted, precooked, or cured meats, such as sausages, meat loaves, and hot dogs. Dairy Cheese. Beverages Regular soft drinks. Regular vegetable juice. Seasonings and condiments Seasoning blends with salt. Salad dressings. Soy sauce. Ketchup. Barbecue sauce. Other foods Canned soups. Canned pasta sauce. Casseroles. Pizza. Lasagna. Frozen meals. Potato chips. French fries. The items listed above may not be a complete list of foods and beverages you should limit. Contact a dietitian for more information. What foods should I avoid? Talk to your dietitian about specific foods you should avoid based on the type of kidney stones you have and your overall health. Fruits Grapefruit. The item listed above may not be a complete list of foods and beverages you should  avoid. Contact a dietitian for more information. Summary Kidney stones are deposits of minerals and salts that form inside your kidneys. You can lower your risk of kidney stones by making changes to your diet. The most important thing you can do is drink enough fluid. Drink enough fluid to keep your urine pale yellow. Talk to your dietitian about how much calcium you should have each day, and eat less salt and animal protein as told by your dietitian. This information is not intended to replace advice given to you by your health care provider. Make sure you discuss any questions you have with your health care provider. Document Revised: 06/06/2019 Document Reviewed: 06/06/2019 Elsevier Patient Education  2022 [employer] Inc.  

## 2021-03-30 ENCOUNTER — Telehealth: Payer: Self-pay

## 2021-03-30 DIAGNOSIS — N200 Calculus of kidney: Secondary | ICD-10-CM

## 2021-03-30 NOTE — Telephone Encounter (Signed)
Called pt no answer. Left detailed message per DPR. Also sent information via mychart. Follow up scheduled, [facility] ordered. KUB ordered.

## 2021-03-30 NOTE — Telephone Encounter (Signed)
-----   Message from Billey Co, MD sent at 03/30/2021  8:28 AM EDT ----- KUB and ultrasound look great.  No ureteral stones or blockage.  There is a very small 35mm non-blocking stone in the left kidney that we can continue to watch  RTC 6 months with KUB, and would recommend repeat 24-hour urine prior to that visit to monitor changes on the new medication(indapamide)  Nickolas Madrid, MD 03/30/2021

## 2021-03-31 DIAGNOSIS — J209 Acute bronchitis, unspecified: Secondary | ICD-10-CM | POA: Diagnosis not present

## 2021-03-31 DIAGNOSIS — Z03818 Encounter for observation for suspected exposure to other biological agents ruled out: Secondary | ICD-10-CM | POA: Diagnosis not present

## 2021-03-31 DIAGNOSIS — R350 Frequency of micturition: Secondary | ICD-10-CM | POA: Diagnosis not present

## 2021-03-31 DIAGNOSIS — J019 Acute sinusitis, unspecified: Secondary | ICD-10-CM | POA: Diagnosis not present

## 2021-03-31 DIAGNOSIS — R059 Cough, unspecified: Secondary | ICD-10-CM | POA: Diagnosis not present

## 2021-04-19 DIAGNOSIS — E785 Hyperlipidemia, unspecified: Secondary | ICD-10-CM | POA: Diagnosis not present

## 2021-04-19 DIAGNOSIS — E559 Vitamin D deficiency, unspecified: Secondary | ICD-10-CM | POA: Diagnosis not present

## 2021-04-19 DIAGNOSIS — Z0000 Encounter for general adult medical examination without abnormal findings: Secondary | ICD-10-CM | POA: Diagnosis not present

## 2021-04-30 DIAGNOSIS — J019 Acute sinusitis, unspecified: Secondary | ICD-10-CM | POA: Diagnosis not present

## 2021-04-30 DIAGNOSIS — Z03818 Encounter for observation for suspected exposure to other biological agents ruled out: Secondary | ICD-10-CM | POA: Diagnosis not present

## 2021-04-30 DIAGNOSIS — J069 Acute upper respiratory infection, unspecified: Secondary | ICD-10-CM | POA: Diagnosis not present

## 2021-04-30 DIAGNOSIS — R051 Acute cough: Secondary | ICD-10-CM | POA: Diagnosis not present

## 2021-05-24 DIAGNOSIS — M5432 Sciatica, left side: Secondary | ICD-10-CM | POA: Diagnosis not present

## 2021-05-24 DIAGNOSIS — M8588 Other specified disorders of bone density and structure, other site: Secondary | ICD-10-CM | POA: Diagnosis not present

## 2021-05-24 DIAGNOSIS — M5412 Radiculopathy, cervical region: Secondary | ICD-10-CM | POA: Diagnosis not present

## 2021-05-24 DIAGNOSIS — M25531 Pain in right wrist: Secondary | ICD-10-CM | POA: Diagnosis not present

## 2021-05-24 DIAGNOSIS — M62830 Muscle spasm of back: Secondary | ICD-10-CM | POA: Diagnosis not present

## 2021-05-24 DIAGNOSIS — M5442 Lumbago with sciatica, left side: Secondary | ICD-10-CM | POA: Diagnosis not present

## 2021-05-24 DIAGNOSIS — M5020 Other cervical disc displacement, unspecified cervical region: Secondary | ICD-10-CM | POA: Diagnosis not present

## 2021-06-07 ENCOUNTER — Emergency Department: Payer: 59

## 2021-06-07 ENCOUNTER — Other Ambulatory Visit: Payer: Self-pay

## 2021-06-07 ENCOUNTER — Emergency Department
Admission: EM | Admit: 2021-06-07 | Discharge: 2021-06-07 | Disposition: A | Discharge status: Home / Self Care | Payer: 59 | Attending: Emergency Medicine | Admitting: Emergency Medicine

## 2021-06-07 DIAGNOSIS — Z79899 Other long term (current) drug therapy: Secondary | ICD-10-CM | POA: Insufficient documentation

## 2021-06-07 DIAGNOSIS — Z20822 Contact with and (suspected) exposure to covid-19: Secondary | ICD-10-CM | POA: Insufficient documentation

## 2021-06-07 DIAGNOSIS — S40022A Contusion of left upper arm, initial encounter: Secondary | ICD-10-CM | POA: Diagnosis not present

## 2021-06-07 DIAGNOSIS — R9431 Abnormal electrocardiogram [ECG] [EKG]: Secondary | ICD-10-CM | POA: Diagnosis not present

## 2021-06-07 DIAGNOSIS — S0990XA Unspecified injury of head, initial encounter: Secondary | ICD-10-CM | POA: Diagnosis not present

## 2021-06-07 DIAGNOSIS — W01198A Fall on same level from slipping, tripping and stumbling with subsequent striking against other object, initial encounter: Secondary | ICD-10-CM | POA: Diagnosis not present

## 2021-06-07 DIAGNOSIS — S4992XA Unspecified injury of left shoulder and upper arm, initial encounter: Secondary | ICD-10-CM | POA: Diagnosis present

## 2021-06-07 LAB — CBC
HCT: 36.8 % (ref 36.0–46.0)
Hemoglobin: 11.4 g/dL — ABNORMAL LOW (ref 12.0–15.0)
MCH: 29.2 pg (ref 26.0–34.0)
MCHC: 31 g/dL (ref 30.0–36.0)
MCV: 94.4 fL (ref 80.0–100.0)
Platelets: 240 10*3/uL (ref 150–400)
RBC: 3.9 MIL/uL (ref 3.87–5.11)
RDW: 14.1 % (ref 11.5–15.5)
WBC: 14.4 10*3/uL — ABNORMAL HIGH (ref 4.0–10.5)
nRBC: 0 % (ref 0.0–0.2)

## 2021-06-07 LAB — COMPREHENSIVE METABOLIC PANEL
ALT: 22 U/L (ref 0–44)
AST: 19 U/L (ref 15–41)
Albumin: 3.5 g/dL (ref 3.5–5.0)
Alkaline Phosphatase: 77 U/L (ref 38–126)
Anion gap: 6 (ref 5–15)
BUN: 16 mg/dL (ref 8–23)
CO2: 29 mmol/L (ref 22–32)
Calcium: 8.9 mg/dL (ref 8.9–10.3)
Chloride: 105 mmol/L (ref 98–111)
Creatinine, Ser: 0.66 mg/dL (ref 0.44–1.00)
GFR, Estimated: >60 mL/min (ref >60)
Glucose, Bld: 100 mg/dL — ABNORMAL HIGH (ref 70–99)
Potassium: 4 mmol/L (ref 3.5–5.1)
Sodium: 140 mmol/L (ref 135–145)
Total Bilirubin: 1.1 mg/dL (ref 0.3–1.2)
Total Protein: 6.7 g/dL (ref 6.5–8.1)

## 2021-06-07 LAB — URINALYSIS, ROUTINE W REFLEX MICROSCOPIC
Bilirubin Urine: NEGATIVE
Glucose, UA: 50 mg/dL — AB
Hgb urine dipstick: NEGATIVE
Ketones, ur: NEGATIVE mg/dL
Leukocytes,Ua: NEGATIVE
Nitrite: NEGATIVE
Protein, ur: NEGATIVE mg/dL
Specific Gravity, Urine: 1.015 (ref 1.005–1.030)
pH: 7 (ref 5.0–8.0)

## 2021-06-07 LAB — URINALYSIS, COMPLETE (UACMP) WITH MICROSCOPIC
Bilirubin Urine: NEGATIVE
Glucose, UA: 50 mg/dL — AB
Hgb urine dipstick: NEGATIVE
Ketones, ur: NEGATIVE mg/dL
Leukocytes,Ua: NEGATIVE
Nitrite: NEGATIVE
Protein, ur: NEGATIVE mg/dL
Specific Gravity, Urine: 1.016 (ref 1.005–1.030)
pH: 7 (ref 5.0–8.0)

## 2021-06-07 LAB — URINE DRUG SCREEN, QUALITATIVE (ARMC ONLY)
Amphetamines, Ur Screen: NOT DETECTED
Barbiturates, Ur Screen: NOT DETECTED
Benzodiazepine, Ur Scrn: POSITIVE — AB
Cannabinoid 50 Ng, Ur ~~LOC~~: NOT DETECTED
Cocaine Metabolite,Ur ~~LOC~~: NOT DETECTED
MDMA (Ecstasy)Ur Screen: NOT DETECTED
Methadone Scn, Ur: NOT DETECTED
Opiate, Ur Screen: NOT DETECTED
Phencyclidine (PCP) Ur S: NOT DETECTED
Tricyclic, Ur Screen: POSITIVE — AB

## 2021-06-07 LAB — RESP PANEL BY RT-PCR (FLU A&B, COVID) ARPGX2
Influenza A by PCR: NEGATIVE
Influenza B by PCR: NEGATIVE
SARS Coronavirus 2 by RT PCR: NEGATIVE

## 2021-06-07 MED ORDER — MECLIZINE HCL 25 MG PO TABS
25.0000 mg | ORAL_TABLET | Freq: Once | ORAL | Status: AC
  Administered 2021-06-07: 25 mg via ORAL
  Filled 2021-06-07: qty 1

## 2021-06-07 MED ORDER — MECLIZINE HCL 25 MG PO TABS: 25.0000 mg | ORAL_TABLET | Freq: Three times a day (TID) | ORAL | 0 refills | Status: DC | PRN

## 2021-06-07 NOTE — ED Provider Notes (Signed)
[facility] Emergency Department Provider Note   ____________________________________________   I have reviewed the triage vital signs and the nursing notes.   HISTORY  Chief Complaint Off balance   History limited by: Not Limited   HPI Ashley Collins is a 60 y.o. female who presents to the emergency department today with concerns for feeling off balance and feeling foggy in her brain.  Husband also has concerns that the patient might be fine.  The patient states that this all seem to start 4 days ago when she had a fall.  She was getting out of her car and fell down embankment.  She did hit her head at that time and the left side of her body.  Additionally the husband states that the patient had a fall out of her bed although she does not remember this.  Husband states she did have similar behavior once before when she was time.  Patient denies any illicit drug use.  She states she has been taking her prescribed medications.    Records reviewed. Per medical record review patient has a history of MRI in 2017 with concern for hygromas.   Past Medical History:  Diagnosis Date   Complication of anesthesia    nausea and vomiting   Kidney stones     There are no problems to display for this patient.   Past Surgical History:  Procedure Laterality Date   AUGMENTATION MAMMAPLASTY     CESAREAN SECTION     x 4   CYSTOSCOPY W/ RETROGRADES Bilateral 07/10/2020   Procedure: CYSTOSCOPY WITH RETROGRADE PYELOGRAM;  Surgeon: Billey Co, MD;  Location: [facility] ORS;  Service: Urology;  Laterality: Bilateral;   CYSTOSCOPY/URETEROSCOPY/HOLMIUM LASER/STENT PLACEMENT     CYSTOSCOPY/URETEROSCOPY/HOLMIUM LASER/STENT PLACEMENT Bilateral 07/10/2020   Procedure: CYSTOSCOPY/URETEROSCOPY/HOLMIUM LASER/STENT PLACEMENT;  Surgeon: Billey Co, MD;  Location: [facility] ORS;  Service: Urology;  Laterality: Bilateral;   EXTRACORPOREAL SHOCK WAVE LITHOTRIPSY     x 10 plus   Eye Lift      FACIAL COSMETIC SURGERY     LIPOSUCTION     PLACEMENT OF BREAST IMPLANTS     TONSILLECTOMY     URETEROSCOPY WITH HOLMIUM LASER LITHOTRIPSY      Prior to Admission medications   Medication Sig Start Date End Date Taking? Authorizing Provider  ALPRAZolam Duanne Moron) 1 MG tablet Take 1 mg by mouth 2 (two) times daily as needed for anxiety.    [provider]  amitriptyline (ELAVIL) 10 MG tablet Take 10 mg by mouth at bedtime. 03/17/21   [provider]  amLODipine (NORVASC) 5 MG tablet Take 5 mg by mouth daily. 03/17/21   [provider]  Biotin 5 MG TABS Take 5 mg by mouth daily.    [provider]  cholecalciferol (VITAMIN D3) 25 MCG (1000 UNIT) tablet Take 1,000 Units by mouth daily.    [provider]  citalopram (CELEXA) 40 MG tablet Take 40 mg by mouth daily.    [provider]  gabapentin (NEURONTIN) 300 MG capsule Take by mouth. 03/17/21   [provider]  indapamide (LOZOL) 2.5 MG tablet Take 1 tablet (2.5 mg total) by mouth daily. 03/25/21   Billey Co, MD  Red Yeast Rice 600 MG CAPS Take 600 mg by mouth daily.    [provider]  zolpidem (AMBIEN) 10 MG tablet Take 10 mg by mouth at bedtime as needed for sleep.    [provider]    Allergies Percocet [oxycodone-acetaminophen]  Family History  Problem Relation Age of Onset   Bladder Cancer Neg Hx    Kidney cancer Neg Hx    Prostate cancer Neg Hx     Social History Social History   Tobacco Use   Smoking status: Never   Smokeless tobacco: Never  Substance Use Topics   Alcohol use: Never   Drug use: Never    Review of Systems Constitutional: No fever/chills Eyes: No visual changes. ENT: No sore throat. Cardiovascular: Denies chest pain. Respiratory: Denies shortness of breath. Gastrointestinal: No abdominal pain. Positive for nausea.  Genitourinary: Negative for dysuria. Musculoskeletal: Negative for back pain. Skin: Negative for  rash. Neurological: Positive for foggy feeling. Off balance.   ____________________________________________   PHYSICAL EXAM:  VITAL SIGNS: ED Triage Vitals [06/07/21 1420]  Enc Vitals Group     BP (!) 143/74     Pulse Rate 94     Resp 18     Temp 98.6 F (37 C)     Temp Source Oral     SpO2 95 %     Weight 118 lb (53.5 kg)     Height 5\' 2"  (1.575 m)     Head Circumference      Peak Flow      Pain Score 3   Constitutional: Alert and oriented.  Eyes: Conjunctivae are normal.  ENT      Head: Normocephalic and atraumatic.      Nose: No congestion/rhinnorhea.      Mouth/Throat: Mucous membranes are moist.      Neck: No stridor. Hematological/Lymphatic/Immunilogical: No cervical lymphadenopathy. Cardiovascular: Normal rate, regular rhythm.  No murmurs, rubs, or gallops.  Respiratory: Normal respiratory effort without tachypnea nor retractions. Breath sounds are clear and equal bilaterally. No wheezes/rales/rhonchi. Gastrointestinal: Soft and non tender. No rebound. No guarding.  Genitourinary: Deferred Musculoskeletal: Normal range of motion in all extremities. No lower extremity edema. Neurologic:  Normal speech and language. No gross focal neurologic deficits are appreciated.  Skin:  Bruising noted to left upper extremity.  Psychiatric: Mood and affect are normal. Speech and behavior are normal. Patient exhibits appropriate insight and judgment.  ____________________________________________    LABS (pertinent positives/negatives)  CMP wnl except glu 100 UDS positive tricyclic, benzodiazepine UA cloudy CBC wbc 14.4, hgb 11.4, plt 240 ____________________________________________   EKG  I, Nance Pear, attending physician, personally viewed and interpreted this EKG  EKG Time: 1421 Rate: 84 Rhythm: sinus rhythm with short pr Axis: normal Intervals: qtc 430 QRS: narrow ST changes: no st elevation Impression: short pr otherwise normal  ekg  ____________________________________________    RADIOLOGY  CT head IMPRESSION:  Crescentic low-density bilateral frontal subdural collections  measuring up to 1.0 cm are seen bilaterally, 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 No significant mass  effect. A similar finding was described in MRI brain report in care  everywhere dated 11/26/2015.    ____________________________________________   PROCEDURES  Procedures  ____________________________________________   INITIAL IMPRESSION / ASSESSMENT AND PLAN / ED COURSE  Pertinent labs & imaging results that were available during my care of the patient were reviewed by me and considered in my medical decision making (see chart for details).   Patient presented to the emergency department today because of feeling somewhat off balance and foggy in the head.  Patient states she did have a fall on Thursday.  CT scan does show some chronic either subdural hematomas or hygromas.  No evidence of any acute abnormality.  Patient's blood work without  any concerning abnormalities.  At this time somewhat unclear etiology of the patient's symptoms but I do wonder if she suffered a concussion.  This time I have low suspicion for meningitis or encephalitis.  Patient stated she did not feel and wanted to be discharged home.  At this point I think that is reasonable.  Will have patient follow-up with neurosurgery.  ____________________________________________   FINAL CLINICAL IMPRESSION(S) / ED DIAGNOSES  Final diagnoses:  Injury of head, initial encounter     Note: This dictation was prepared with Dragon dictation. Any transcriptional errors that result from this process are unintentional     Nance Pear, MD 06/07/21 2023

## 2021-06-07 NOTE — ED Provider Notes (Signed)
Emergency Medicine Provider Triage Evaluation Note  Ashley Collins, a 60 y.o. female  was evaluated in triage.  Pt complains of AMS, as presented by her husband. He notes a fall out of bed the other night, but denies syncope. She admits to daily meds for migraine prevention. She also reports a fall out of her daughter's car, when she felt nauseated. He husband voices concern for polypharmacy, notes she take "pills" that are both prescribed and traded.   Review of Systems  Positive: AMS, head injury Negative: syncope  Physical Exam  BP (!) 143/74 (BP Location: Right Arm)   Pulse 94   Temp 98.6 F (37 C) (Oral)   Resp 18   Ht 5\' 2"  (1.575 m)   Wt 53.5 kg   SpO2 95%   BMI 21.58 kg/m  Gen:   Awake, no distress Appears under the influence of medicines Resp:  Normal effort NAD MSK:   Moves extremities without difficulty Multiple bruises to the UEs Other:  CVS: RRR  Medical Decision Making  Medically screening exam initiated at 2:33 PM.  Appropriate orders placed.  Ashley Collins was informed that the remainder of the evaluation will be completed by another provider, this initial triage assessment does not replace that evaluation, and the importance of remaining in the ED until their evaluation is complete.  Patient presented to the ED with husband, for concern for altered mental status.  Patient reports several falls in the last few days.  Husband voices concern for polypharmacy or medication misuse.   Ashley Needles, PA-C 06/07/21 1441    Nena Polio, MD 06/07/21 [phone]

## 2021-06-07 NOTE — ED Notes (Addendum)
Pt states she feels confused.  Recently fell out of bed and has had lots of falls.  Pt repeating herself at times.  Pt has bruising to left arm and states I fall a lot.   Pt also has left ear pain.   Pt in hallway bed.  Family with pt.  Md at bedside.

## 2021-06-07 NOTE — ED Notes (Signed)
Dr. Archie Balboa at the bedside speaking with patient and her spouse

## 2021-06-07 NOTE — ED Provider Notes (Signed)
-----------------------------------------   5:08 PM on 06/07/2021 -----------------------------------------  I received a verbal report from radiology about this patient's CT.  She has bilateral frontal subdural collections which could either be chronic subdural hemorrhage versus hygromas.  Prior CT report lists similar findings but the radiologist is unable to see the images directly for comparison.  I informed charge RN Shawn Stall about this finding and requested that the patient be expedited for evaluation in a room.   Arta Silence, MD 06/07/21 [phone]

## 2021-06-07 NOTE — Discharge Instructions (Signed)
Please seek medical attention for any high fevers, chest pain, shortness of breath, change in behavior, persistent vomiting, bloody stool or any other new or concerning symptoms.  

## 2021-06-07 NOTE — ED Notes (Signed)
Report received from Amy, RN.

## 2021-06-07 NOTE — ED Triage Notes (Addendum)
Pt states she rolled out of bed on Thursday and hit her head and has not felt well since, states having N/V/D for the past 4-5 days, fell again on saturday when getting out of the car when she was getting sick, pt has multiple bruising to the arms. Pt states she feels fog brain Significant other is concerned the pt is mixing medications

## 2021-07-05 ENCOUNTER — Other Ambulatory Visit: Payer: Self-pay | Admitting: Diagnostic Radiology

## 2021-07-05 ENCOUNTER — Ambulatory Visit
Admission: RE | Admit: 2021-07-05 | Discharge: 2021-07-05 | Disposition: A | Discharge status: Home / Self Care | Payer: Self-pay | Source: Ambulatory Visit | Attending: Diagnostic Radiology | Admitting: Diagnostic Radiology

## 2021-07-05 DIAGNOSIS — S0990XS Unspecified injury of head, sequela: Secondary | ICD-10-CM

## 2021-08-04 DIAGNOSIS — E559 Vitamin D deficiency, unspecified: Secondary | ICD-10-CM | POA: Diagnosis not present

## 2021-08-04 DIAGNOSIS — I10 Essential (primary) hypertension: Secondary | ICD-10-CM | POA: Diagnosis not present

## 2021-08-04 DIAGNOSIS — R69 Illness, unspecified: Secondary | ICD-10-CM | POA: Diagnosis not present

## 2021-08-05 DIAGNOSIS — E559 Vitamin D deficiency, unspecified: Secondary | ICD-10-CM | POA: Diagnosis not present

## 2021-08-05 DIAGNOSIS — E785 Hyperlipidemia, unspecified: Secondary | ICD-10-CM | POA: Diagnosis not present

## 2021-08-05 DIAGNOSIS — Z131 Encounter for screening for diabetes mellitus: Secondary | ICD-10-CM | POA: Diagnosis not present

## 2021-08-05 DIAGNOSIS — I10 Essential (primary) hypertension: Secondary | ICD-10-CM | POA: Diagnosis not present

## 2021-08-24 DIAGNOSIS — J019 Acute sinusitis, unspecified: Secondary | ICD-10-CM | POA: Diagnosis not present

## 2021-08-24 DIAGNOSIS — Z03818 Encounter for observation for suspected exposure to other biological agents ruled out: Secondary | ICD-10-CM | POA: Diagnosis not present

## 2021-08-27 DIAGNOSIS — N898 Other specified noninflammatory disorders of vagina: Secondary | ICD-10-CM | POA: Diagnosis not present

## 2021-08-27 DIAGNOSIS — Z01419 Encounter for gynecological examination (general) (routine) without abnormal findings: Secondary | ICD-10-CM | POA: Diagnosis not present

## 2021-08-27 DIAGNOSIS — Z13820 Encounter for screening for osteoporosis: Secondary | ICD-10-CM | POA: Diagnosis not present

## 2021-08-27 DIAGNOSIS — Z1231 Encounter for screening mammogram for malignant neoplasm of breast: Secondary | ICD-10-CM | POA: Diagnosis not present

## 2021-08-30 DIAGNOSIS — M5136 Other intervertebral disc degeneration, lumbar region: Secondary | ICD-10-CM | POA: Diagnosis not present

## 2021-08-30 DIAGNOSIS — M5020 Other cervical disc displacement, unspecified cervical region: Secondary | ICD-10-CM | POA: Diagnosis not present

## 2021-08-30 DIAGNOSIS — M5412 Radiculopathy, cervical region: Secondary | ICD-10-CM | POA: Diagnosis not present

## 2021-08-30 DIAGNOSIS — M5442 Lumbago with sciatica, left side: Secondary | ICD-10-CM | POA: Diagnosis not present

## 2021-09-02 ENCOUNTER — Other Ambulatory Visit: Payer: Self-pay | Admitting: Family Medicine

## 2021-09-02 DIAGNOSIS — M5416 Radiculopathy, lumbar region: Secondary | ICD-10-CM

## 2021-09-03 ENCOUNTER — Ambulatory Visit
Admission: RE | Admit: 2021-09-03 | Discharge: 2021-09-03 | Disposition: A | Discharge status: Home / Self Care | Payer: 59 | Source: Ambulatory Visit | Attending: Family Medicine | Admitting: Family Medicine

## 2021-09-03 DIAGNOSIS — M5416 Radiculopathy, lumbar region: Secondary | ICD-10-CM | POA: Diagnosis not present

## 2021-09-07 DIAGNOSIS — M5126 Other intervertebral disc displacement, lumbar region: Secondary | ICD-10-CM | POA: Diagnosis not present

## 2021-09-07 DIAGNOSIS — M5136 Other intervertebral disc degeneration, lumbar region: Secondary | ICD-10-CM | POA: Diagnosis not present

## 2021-09-07 DIAGNOSIS — M5416 Radiculopathy, lumbar region: Secondary | ICD-10-CM | POA: Diagnosis not present

## 2021-09-15 DIAGNOSIS — M810 Age-related osteoporosis without current pathological fracture: Secondary | ICD-10-CM | POA: Diagnosis not present

## 2021-09-16 DIAGNOSIS — M47816 Spondylosis without myelopathy or radiculopathy, lumbar region: Secondary | ICD-10-CM | POA: Diagnosis not present

## 2021-09-17 DIAGNOSIS — N200 Calculus of kidney: Secondary | ICD-10-CM | POA: Diagnosis not present

## 2021-09-22 ENCOUNTER — Other Ambulatory Visit: Payer: Self-pay | Admitting: Urology

## 2021-09-30 ENCOUNTER — Ambulatory Visit: Payer: 59 | Admitting: Urology

## 2021-09-30 ENCOUNTER — Encounter: Payer: Self-pay | Admitting: Urology

## 2021-09-30 VITALS — BP 121/76 | HR 87 | Ht 62.0 in

## 2021-09-30 DIAGNOSIS — N200 Calculus of kidney: Secondary | ICD-10-CM | POA: Diagnosis not present

## 2021-09-30 MED ORDER — INDAPAMIDE 2.5 MG PO TABS: 2.5000 mg | ORAL_TABLET | Freq: Every day | ORAL | 11 refills | Status: DC

## 2021-09-30 NOTE — Patient Instructions (Signed)
Dietary Guidelines to Help Prevent Kidney Stones Kidney stones are deposits of minerals and salts that form inside your kidneys. Your risk of developing kidney stones may be greater depending on your diet, your lifestyle, the medicines you take, and whether you have certain medical conditions. Most people can lower their chances of developing kidney stones by following the instructions below. Your dietitian may give you more specific instructions depending on your overall health and the type of kidney stones you tend to develop. What are tips for following this plan? Reading food labels  Choose foods with "no salt added" or "low-salt" labels. Limit your salt (sodium) intake to less than 1,500 mg a day. Choose foods with calcium for each meal and snack. Try to eat about 300 mg of calcium at each meal. Foods that contain 200-500 mg of calcium a serving include: 8 oz (237 mL) of milk, calcium-fortifiednon-dairy milk, and calcium-fortifiedfruit juice. Calcium-fortified means that calcium has been added to these drinks. 8 oz (237 mL) of kefir, yogurt, and soy yogurt. 4 oz (114 g) of tofu. 1 oz (28 g) of cheese. 1 cup (150 g) of dried figs. 1 cup (91 g) of cooked broccoli. One 3 oz (85 g) can of sardines or mackerel. Most people need 1,000-1,500 mg of calcium a day. Talk to your dietitian about how much calcium is recommended for you. Shopping Buy plenty of fresh fruits and vegetables. Most people do not need to avoid fruits and vegetables, even if these foods contain nutrients that may contribute to kidney stones. When shopping for convenience foods, choose: Whole pieces of fruit. Pre-made salads with dressing on the side. Low-fat fruit and yogurt smoothies. Avoid buying frozen meals or prepared deli foods. These can be high in sodium. Look for foods with live cultures, such as yogurt and kefir. Choose high-fiber grains, such as whole-wheat breads, oat bran, and wheat cereals. Cooking Do not add  salt to food when cooking. Place a salt shaker on the table and allow each person to add his or her own salt to taste. Use vegetable protein, such as beans, textured vegetable protein (TVP), or tofu, instead of meat in pasta, casseroles, and soups. Meal planning Eat less salt, if told by your dietitian. To do this: Avoid eating processed or pre-made food. Avoid eating fast food. Eat less animal protein, including cheese, meat, poultry, or fish, if told by your dietitian. To do this: Limit the number of times you have meat, poultry, fish, or cheese each week. Eat a diet free of meat at least 2 days a week. Eat only one serving each day of meat, poultry, fish, or seafood. When you prepare animal protein, cut pieces into small portion sizes. For most meat and fish, one serving is about the size of the palm of your hand. Eat at least five servings of fresh fruits and vegetables each day. To do this: Keep fruits and vegetables on hand for snacks. Eat one piece of fruit or a handful of berries with breakfast. Have a salad and fruit at lunch. Have two kinds of vegetables at dinner. Limit foods that are high in a substance called oxalate. These include: Spinach (cooked), rhubarb, beets, sweet potatoes, and Swiss chard. Peanuts. Potato chips, french fries, and baked potatoes with skin on. Nuts and nut products. Chocolate. If you regularly take a diuretic medicine, make sure to eat at least 1 or 2 servings of fruits or vegetables that are high in potassium each day. These include: Avocado. Banana. Orange, prune,   carrot, or tomato juice. Baked potato. Cabbage. Beans and split peas. Lifestyle  Drink enough fluid to keep your urine pale yellow. This is the most important thing you can do. Spread your fluid intake throughout the day. If you drink alcohol: Limit how much you use to: 0-1 drink a day for women who are not pregnant. 0-2 drinks a day for men. Be aware of how much alcohol is in your  drink. In the [place] one drink equals one 12 oz bottle of beer (355 mL), one 5 oz glass of wine (148 mL), or one 1 oz glass of hard liquor (44 mL). Lose weight if told by your health care provider. Work with your dietitian to find an eating plan and weight loss strategies that work best for you. General information Talk to your health care provider and dietitian about taking daily supplements. You may be told the following depending on your health and the cause of your kidney stones: Not to take supplements with vitamin C. To take a calcium supplement. To take a daily probiotic supplement. To take other supplements such as magnesium, fish oil, or vitamin B6. Take over-the-counter and prescription medicines only as told by your health care provider. These include supplements. What foods should I limit? Limit your intake of the following foods, or eat them as told by your dietitian. Vegetables Spinach. Rhubarb. Beets. Canned vegetables. Pickles. Olives. Baked potatoes with skin. Grains Wheat bran. Baked goods. Salted crackers. Cereals high in sugar. Meats and other proteins Nuts. Nut butters. Large portions of meat, poultry, or fish. Salted, precooked, or cured meats, such as sausages, meat loaves, and hot dogs. Dairy Cheese. Beverages Regular soft drinks. Regular vegetable juice. Seasonings and condiments Seasoning blends with salt. Salad dressings. Soy sauce. Ketchup. Barbecue sauce. Other foods Canned soups. Canned pasta sauce. Casseroles. Pizza. Lasagna. Frozen meals. Potato chips. French fries. The items listed above may not be a complete list of foods and beverages you should limit. Contact a dietitian for more information. What foods should I avoid? Talk to your dietitian about specific foods you should avoid based on the type of kidney stones you have and your overall health. Fruits Grapefruit. The item listed above may not be a complete list of foods and beverages you should  avoid. Contact a dietitian for more information. Summary Kidney stones are deposits of minerals and salts that form inside your kidneys. You can lower your risk of kidney stones by making changes to your diet. The most important thing you can do is drink enough fluid. Drink enough fluid to keep your urine pale yellow. Talk to your dietitian about how much calcium you should have each day, and eat less salt and animal protein as told by your dietitian. This information is not intended to replace advice given to you by your health care provider. Make sure you discuss any questions you have with your health care provider. Document Revised: 06/06/2019 Document Reviewed: 06/06/2019 Elsevier Patient Education  2022 [employer] Inc.  

## 2021-09-30 NOTE — Progress Notes (Signed)
? ?  09/30/2021 ?3:37 PM  ? ?Ashley Collins ?February 12, 1961 ?[id number] ? ?Reason for visit: Follow up nephrolithiasis, review 24-hour urine results ? 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  She underwent bilateral ureteroscopy on 07/10/2020 for multiple impacted right ureteral stones that had been present at least 4 to 5 months, as well as small left distal ureteral stones and left renal stones.  A follow-up renal ultrasound in September 2022 showed borderline to minimal hydronephrosis on the right, but no other significant abnormalities, which is not surprising with her history of obstruction for 4 to 5 months prior to definitive treatment. ? ?Overall she has been doing well.  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  She denies any stone episodes since our last visit. ? ?Stone type was calcium oxalate.  Most recent 24-hour urine from 09/17/2021 showed persistent low urine volume of 1.12, significant improvement in urine calcium to 66, normal urine oxalate of 24, low urine citrate of 229, normal urine sodium of 63. ? ?We discussed prevention strategies including increasing fluid intake, continuing high citrate foods, and continuing the indapamide which has had a positive effect on her urine calcium.  Risks and benefits discussed. ? ?Continue indapamide 2.5 mg daily ?RTC 1 year KUB prior ? ? ?Billey Co, MD ? ?[place] ?[address], [address] ?[place], [place] [address] ?[phone] ? ?

## 2021-10-12 DIAGNOSIS — M47816 Spondylosis without myelopathy or radiculopathy, lumbar region: Secondary | ICD-10-CM | POA: Diagnosis not present

## 2021-10-22 DIAGNOSIS — M65331 Trigger finger, right middle finger: Secondary | ICD-10-CM | POA: Diagnosis not present

## 2021-10-22 DIAGNOSIS — M25531 Pain in right wrist: Secondary | ICD-10-CM | POA: Diagnosis not present

## 2021-10-22 DIAGNOSIS — M67431 Ganglion, right wrist: Secondary | ICD-10-CM | POA: Diagnosis not present

## 2021-10-22 DIAGNOSIS — M79644 Pain in right finger(s): Secondary | ICD-10-CM | POA: Diagnosis not present

## 2021-10-31 IMAGING — US US RENAL
1 series · 14 of 25 positions shown · non-contrast
Comparison: Prior CT from 07/09/2020.

CLINICAL DATA: Follow-up examination for nephrolithiasis.

EXAM:
RENAL / URINARY TRACT ULTRASOUND COMPLETE

[Series 1: us renal · 0.19mm/px · 14 of 66 slices shown]
[im 1/66]
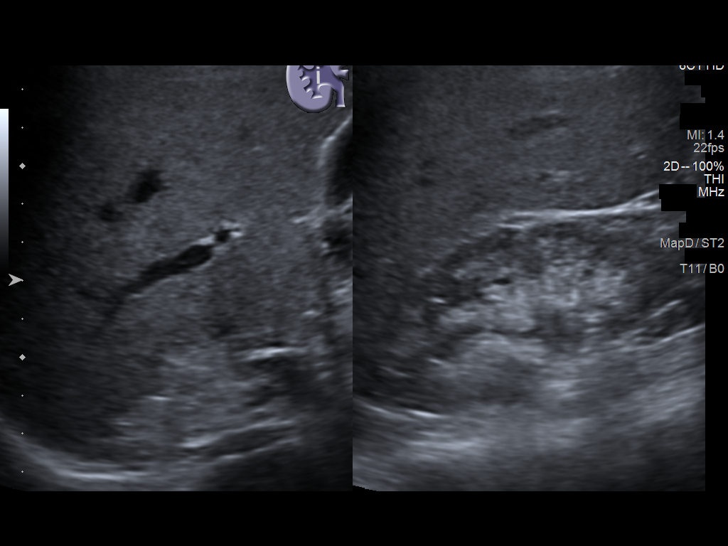
[im 6/66]
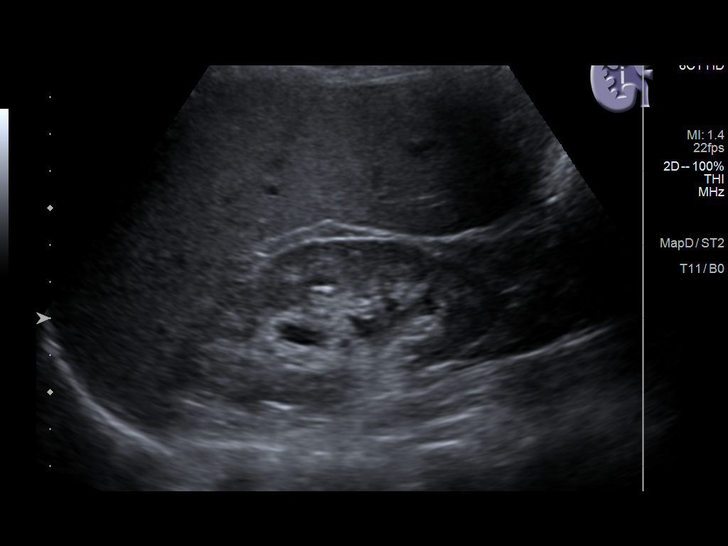
[im 11/66]
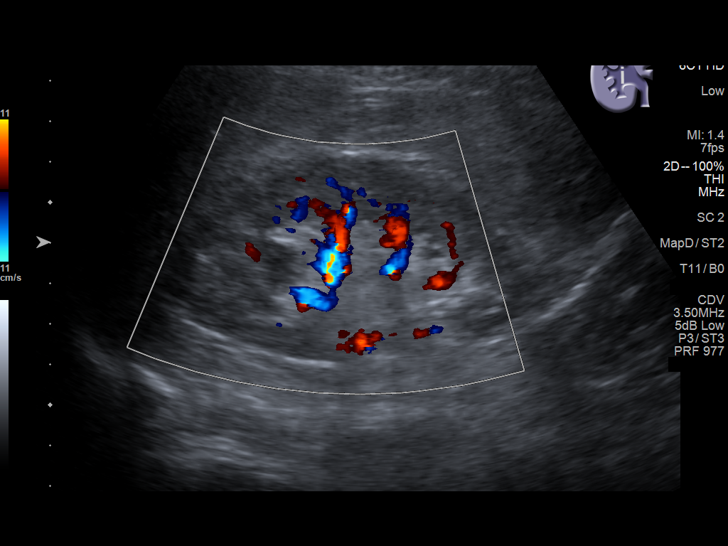
[im 17/66]
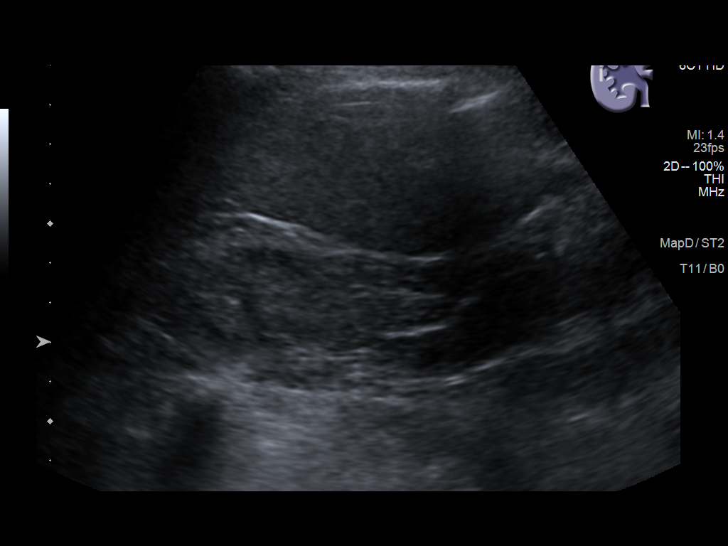
[im 22/66]
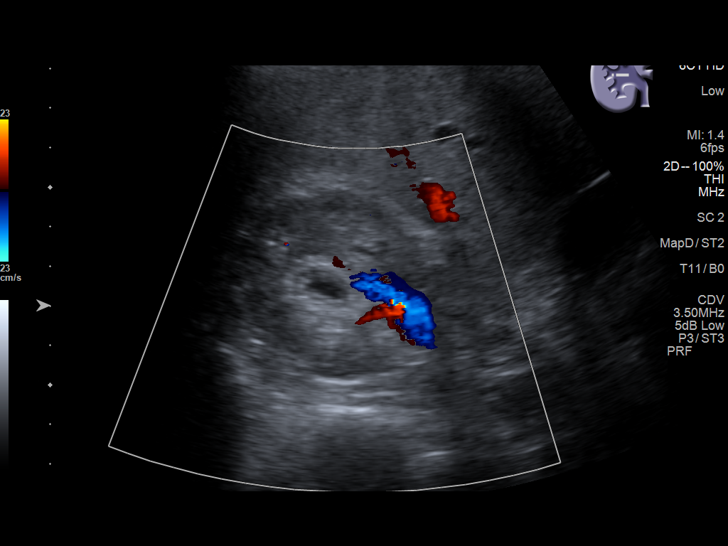
[im 25/66]
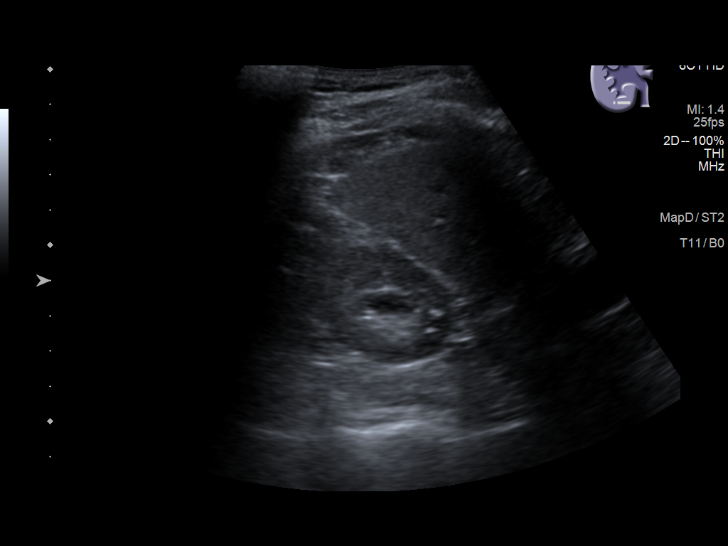
[im 30/66]
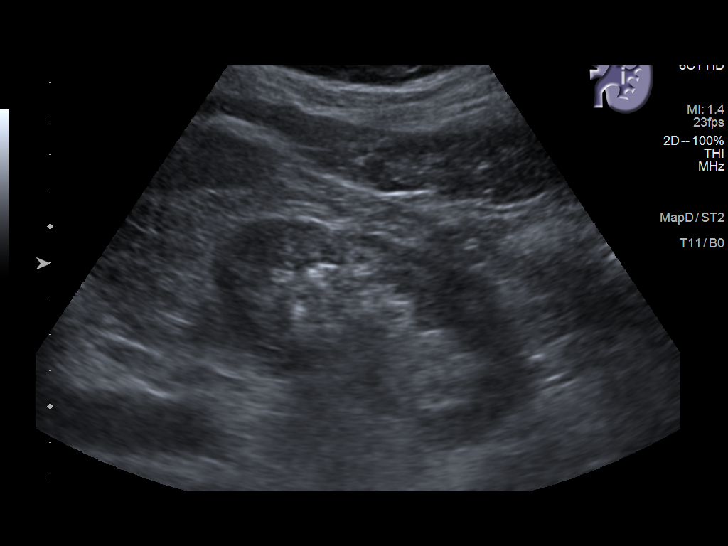
[im 36/66]
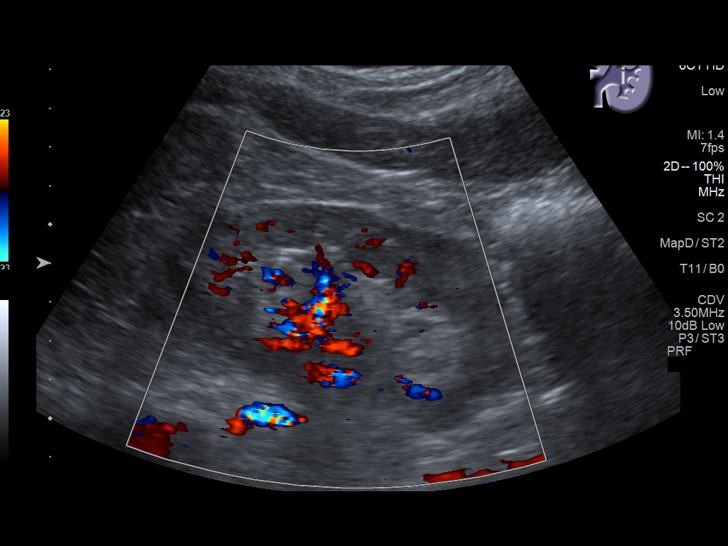
[im 41/66]
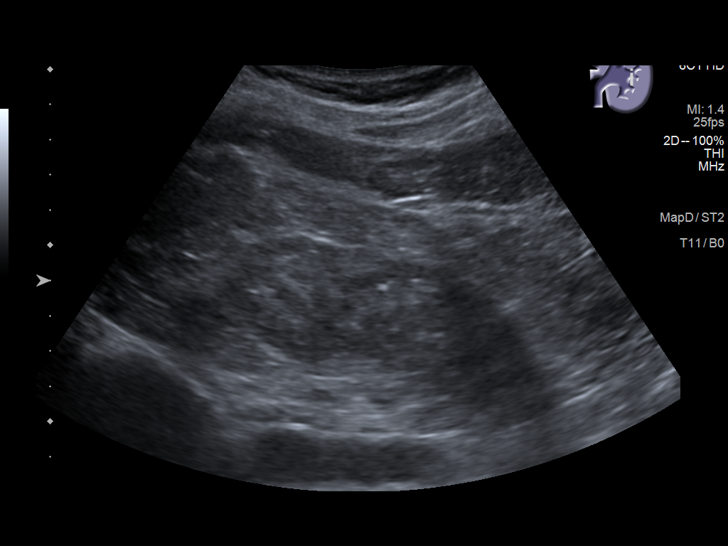
[im 44/66]
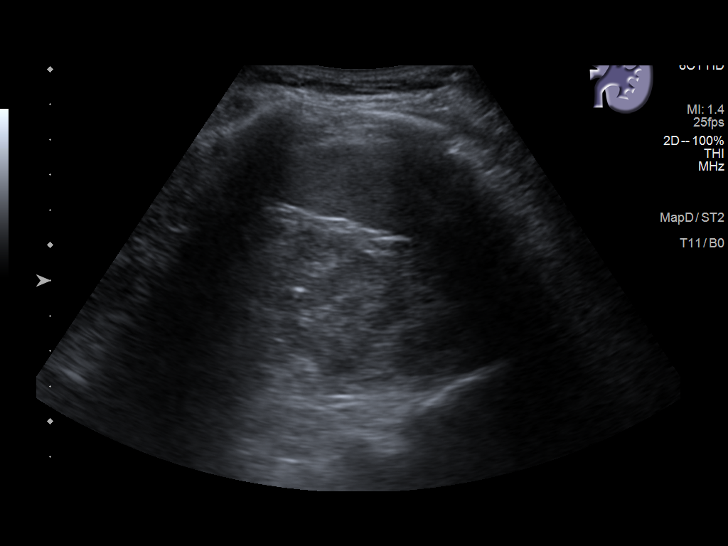
[im 49/66]
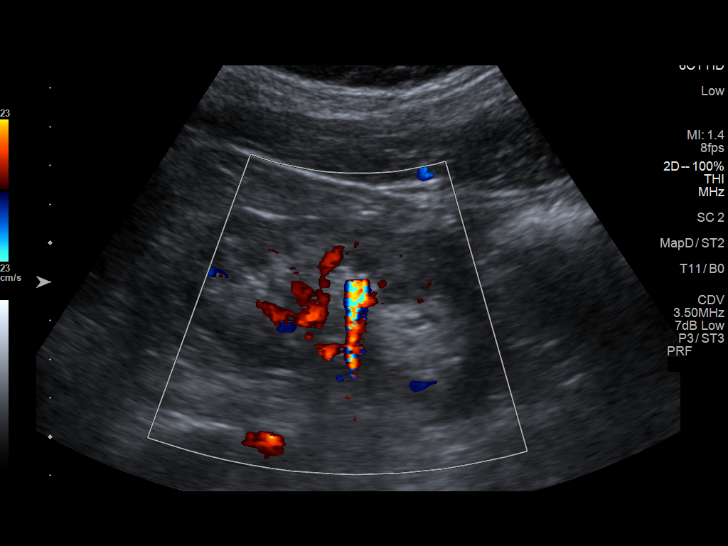
[im 55/66]
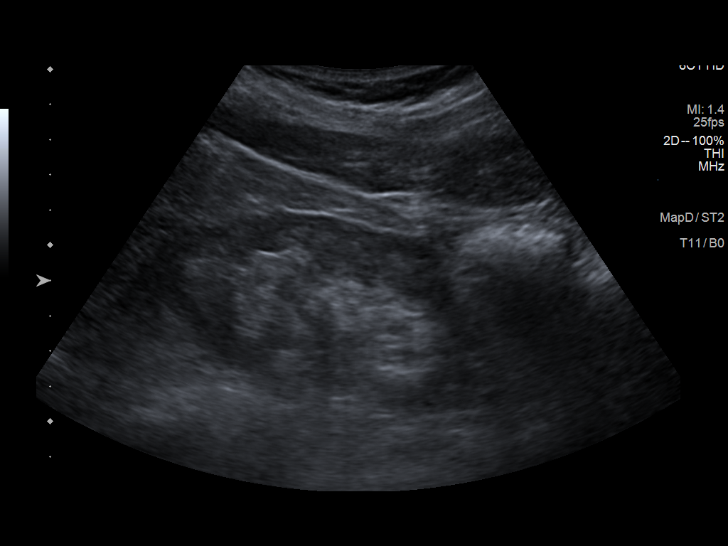
[im 60/66]
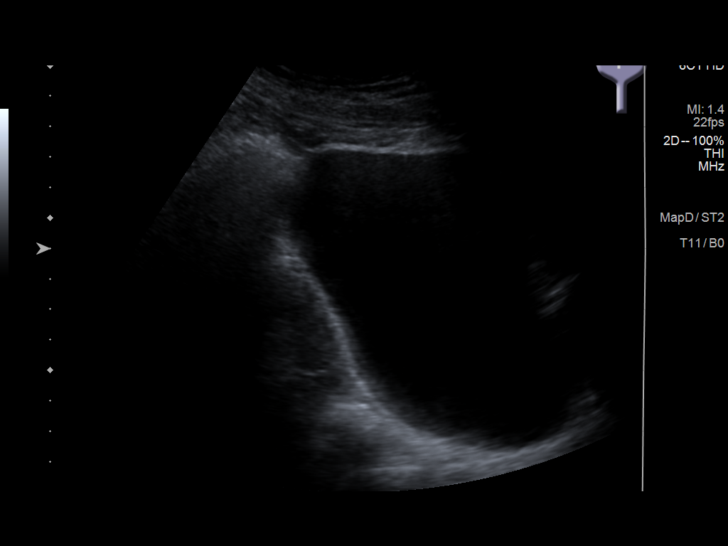
[im 66/66]
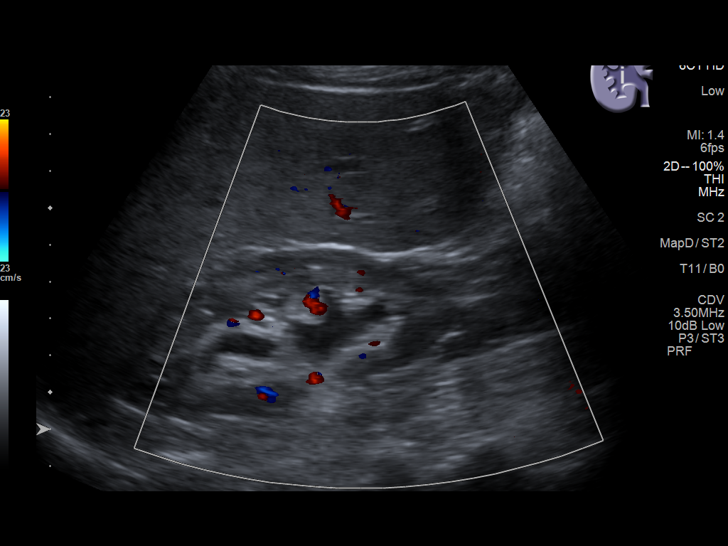

[14 of 25 positions shown; findings below may reference images not displayed]

FINDINGS: Right Kidney:

Renal measurements: 9.4 x 5.0 x 4.3 cm = volume: 105.4 mL. Renal
echogenicity within normal limits. No visible nephrolithiasis. Mild
right-sided hydronephrosis, which persists status post voiding. No
focal renal mass.

Left Kidney:

Renal measurements: 9.7 x 4.9 x 4.1 cm = volume: 101.4 mL. Renal
echogenicity within normal limits. 6 mm nonobstructive calculus
present at the interpolar region. No hydronephrosis. No focal renal
mass.

Bladder:

Appears normal for degree of bladder distention. Both ureteral jets
are visualized, although the right jet is attenuated.

Other:

None.
IMPRESSION: 1. Persistent mild right-sided hydronephrosis. An attenuated right
jet is seen at the bladder.
2. 6 mm nonobstructive left renal nephrolithiasis.

## 2021-11-01 DIAGNOSIS — Z1211 Encounter for screening for malignant neoplasm of colon: Secondary | ICD-10-CM | POA: Diagnosis not present

## 2021-11-01 DIAGNOSIS — Z0000 Encounter for general adult medical examination without abnormal findings: Secondary | ICD-10-CM | POA: Diagnosis not present

## 2021-11-01 DIAGNOSIS — R69 Illness, unspecified: Secondary | ICD-10-CM | POA: Diagnosis not present

## 2021-11-01 DIAGNOSIS — I10 Essential (primary) hypertension: Secondary | ICD-10-CM | POA: Diagnosis not present

## 2021-11-01 DIAGNOSIS — E785 Hyperlipidemia, unspecified: Secondary | ICD-10-CM | POA: Diagnosis not present

## 2021-11-04 DIAGNOSIS — Z1211 Encounter for screening for malignant neoplasm of colon: Secondary | ICD-10-CM | POA: Diagnosis not present

## 2021-11-04 DIAGNOSIS — M25531 Pain in right wrist: Secondary | ICD-10-CM | POA: Diagnosis not present

## 2021-11-04 DIAGNOSIS — M79644 Pain in right finger(s): Secondary | ICD-10-CM | POA: Diagnosis not present

## 2021-11-04 DIAGNOSIS — M25431 Effusion, right wrist: Secondary | ICD-10-CM | POA: Diagnosis not present

## 2021-11-04 DIAGNOSIS — M65331 Trigger finger, right middle finger: Secondary | ICD-10-CM | POA: Diagnosis not present

## 2021-11-04 DIAGNOSIS — M47816 Spondylosis without myelopathy or radiculopathy, lumbar region: Secondary | ICD-10-CM | POA: Diagnosis not present

## 2021-11-04 DIAGNOSIS — M67431 Ganglion, right wrist: Secondary | ICD-10-CM | POA: Diagnosis not present

## 2021-11-21 ENCOUNTER — Encounter: Payer: Self-pay | Admitting: Emergency Medicine

## 2021-11-21 ENCOUNTER — Other Ambulatory Visit: Payer: Self-pay

## 2021-11-21 ENCOUNTER — Emergency Department: Payer: 59

## 2021-11-21 ENCOUNTER — Emergency Department
Admission: EM | Admit: 2021-11-21 | Discharge: 2021-11-21 | Disposition: A | Discharge status: Home / Self Care | Payer: 59 | Attending: Emergency Medicine | Admitting: Emergency Medicine

## 2021-11-21 DIAGNOSIS — R0781 Pleurodynia: Secondary | ICD-10-CM | POA: Diagnosis not present

## 2021-11-21 DIAGNOSIS — W01198A Fall on same level from slipping, tripping and stumbling with subsequent striking against other object, initial encounter: Secondary | ICD-10-CM | POA: Insufficient documentation

## 2021-11-21 DIAGNOSIS — S8001XA Contusion of right knee, initial encounter: Secondary | ICD-10-CM | POA: Insufficient documentation

## 2021-11-21 DIAGNOSIS — S8002XA Contusion of left knee, initial encounter: Secondary | ICD-10-CM | POA: Insufficient documentation

## 2021-11-21 DIAGNOSIS — Y990 Civilian activity done for income or pay: Secondary | ICD-10-CM | POA: Diagnosis not present

## 2021-11-21 DIAGNOSIS — S8992XA Unspecified injury of left lower leg, initial encounter: Secondary | ICD-10-CM | POA: Diagnosis not present

## 2021-11-21 NOTE — ED Provider Notes (Signed)
[facility] Provider Note    Event Date/Time   First MD Initiated Contact with Patient 11/21/21 1018     (approximate)   History   Fall and Rib Injury   HPI  Ashley Collins is a 61 y.o. female with past medical history of kidney stones who presents with right rib pain after fall.  About 9 days ago patient had a mechanical fall while at work at [employer].  She tripped on a cord falling forward broke her fall with her bilateral hands not sure if she hit her chest on the floor did not hit her head.  Several days later she started having some pain under the right breast also has some mild right neck pain that radiates to the arm and this is made worse when she is using her right arm to slice deli meat.  Denies headache nausea vomiting no numbness tingling weakness.  Also with some right knee clicking.  But is ambulating fine and does not have any pain.  Is not taking anything for pain currently.    Past Medical History:  Diagnosis Date   Complication of anesthesia    nausea and vomiting   Kidney stones     There are no problems to display for this patient.    Physical Exam  Triage Vital Signs: ED Triage Vitals  Enc Vitals Group     BP 11/21/21 0923 127/77     Pulse Rate 11/21/21 0923 90     Resp 11/21/21 0923 16     Temp 11/21/21 0923 98.4 F (36.9 C)     Temp Source 11/21/21 0923 Oral     SpO2 11/21/21 0923 96 %     Weight 11/21/21 0923 119 lb 0.8 oz (54 kg)     Height 11/21/21 0923 '5\' 2"'$  (1.575 m)     Head Circumference --      Peak Flow --      Pain Score 11/21/21 0922 4     Pain Loc --      Pain Edu? --      Excl. in [place]? --     Most recent vital signs: Vitals:   11/21/21 0923 11/21/21 0928  BP: 127/77 127/77  Pulse: 90 90  Resp: 16 20  Temp: 98.4 F (36.9 C) 98.4 F (36.9 C)  SpO2: 96% 96%     General: Awake, no distress.  CV:  Good peripheral perfusion.  Resp:  Normal effort.  Abd:  No distention.  Neuro:             Awake,  Alert, Oriented x 3  Other:  Tenderness to palpation over the right anterior ribs with no palpable crepitus lung sounds are equal bilaterally  No midline C-spine tenderness mild right cervical paraspinal tenderness 5 out of 5 strength with grip elbow flexion and elbow extension  Ecchymosis in the infrapatellar region of the left knee without swelling no tenderness to palpation with normal range of motion Right knee with mild ecchymosis on the lateral part but no tenderness to palpation deformity swelling or effusion and has normal range of motion no laxity  ED Results / Procedures / Treatments  Labs (all labs ordered are listed, but only abnormal results are displayed) Labs Reviewed - No data to display   EKG     RADIOLOGY Rib series reviewed and interpreted by myself shows no pneumothorax or obvious rib fracture   PROCEDURES:  Critical Care performed: No  Procedures  MEDICATIONS ORDERED IN ED: Medications - No data to display   IMPRESSION / MDM / [place] / ED COURSE  I reviewed the triage vital signs and the nursing notes.                              Differential diagnosis includes, but is not limited to, rib contusion, rib fracture, muscle strain  The patient is a 61 year old female presents with right rib pain after a fall 9 days ago.  She fell forward after tripping and broke her fall with her hands did not hit her head several days later started having right anterior rib pain that is worse with inspiration but she is not short of breath.  On exam she has some mild tenderness to palpation of the right anterior ribs but no bruising no crepitus lung sounds are equal she also has some right paraspinal cervical tenderness but no midline C-spine tenderness and she is neurologically intact in her extremities.  The knees have some scattered ecchymosis but there is no deformity no swelling no tenderness normal range of motion she is ambulating fine and really  does not have much pain.  Rib series on the right was obtained which does not show any obvious rib fracture or pneumothorax.  Suspect muscle strain from the fall versus rib contusion.  Advised Tylenol and Motrin patient did not want anything for pain currently.  Does want a work note which was provided for her.      FINAL CLINICAL IMPRESSION(S) / ED DIAGNOSES   Final diagnoses:  Rib pain     Rx / DC Orders   ED Discharge Orders     None        Note:  This document was prepared using Dragon voice recognition software and may include unintentional dictation errors.   Rada Hay, MD 11/21/21 1043

## 2021-11-21 NOTE — Discharge Instructions (Signed)
You likely bruised her rib or strained her muscles during the fall.  There is no rib fracture seen on x-ray today.  You can take Tylenol and Motrin for pain.

## 2021-11-21 NOTE — ED Triage Notes (Signed)
Pt reports on May 19th she tripped over a cord at work and fell hurting her right ribs. Pt reports continued pain since then. Denies LOC. Per employer profile, no WC testing is required

## 2021-11-25 LAB — COLOGUARD: COLOGUARD: POSITIVE — AB

## 2021-11-29 ENCOUNTER — Other Ambulatory Visit: Payer: Self-pay | Admitting: Obstetrics and Gynecology

## 2021-11-29 DIAGNOSIS — Z1231 Encounter for screening mammogram for malignant neoplasm of breast: Secondary | ICD-10-CM

## 2021-12-03 DIAGNOSIS — M65331 Trigger finger, right middle finger: Secondary | ICD-10-CM | POA: Diagnosis not present

## 2021-12-03 DIAGNOSIS — M19041 Primary osteoarthritis, right hand: Secondary | ICD-10-CM | POA: Diagnosis not present

## 2021-12-03 DIAGNOSIS — M67431 Ganglion, right wrist: Secondary | ICD-10-CM | POA: Diagnosis not present

## 2021-12-13 ENCOUNTER — Other Ambulatory Visit: Payer: Self-pay | Admitting: Orthopedic Surgery

## 2021-12-14 ENCOUNTER — Other Ambulatory Visit: Payer: Self-pay

## 2021-12-14 ENCOUNTER — Encounter
Admission: RE | Admit: 2021-12-14 | Discharge: 2021-12-14 | Disposition: A | Discharge status: Home / Self Care | Payer: 59 | Source: Ambulatory Visit | Attending: Orthopedic Surgery | Admitting: Orthopedic Surgery

## 2021-12-14 DIAGNOSIS — Z01812 Encounter for preprocedural laboratory examination: Secondary | ICD-10-CM

## 2021-12-14 HISTORY — DX: Personal history of urinary calculi: Z87.442

## 2021-12-14 HISTORY — DX: Other specified postprocedural states: R11.2

## 2021-12-14 HISTORY — DX: Headache, unspecified: R51.9

## 2021-12-14 HISTORY — DX: Malignant (primary) neoplasm, unspecified: C80.1

## 2021-12-14 HISTORY — DX: Other specified postprocedural states: Z98.890

## 2021-12-14 HISTORY — DX: Essential (primary) hypertension: I10

## 2021-12-14 NOTE — Patient Instructions (Addendum)
Your procedure is scheduled on: 12/21/21 - Tuesday Report to the Registration Desk on the 1st floor of the [facility]. To find out your arrival time, please call [phone] between 1PM - 3PM on: 12/20/21 - Monday  If your arrival time is 6:00 am, do not arrive prior to that time as the [place] entrance doors do not open until 6:00 am.  REMEMBER: Instructions that are not followed completely may result in serious medical risk, up to and including death; or upon the discretion of your surgeon and anesthesiologist your surgery may need to be rescheduled.  Do not eat food after midnight the night before surgery.  No gum chewing, lozengers or hard candies.  You may however, drink CLEAR liquids up to 2 hours before you are scheduled to arrive for your surgery. Do not drink anything within 2 hours of your scheduled arrival time.  Clear liquids include: - water  - apple juice without pulp - gatorade (not RED colors) - black coffee or tea (Do NOT add milk or creamers to the coffee or tea) Do NOT drink anything that is not on this list.  In addition, your doctor has ordered for you to drink the provided  Ensure Pre-Surgery Clear Carbohydrate Drink  Drinking this carbohydrate drink up to two hours before surgery helps to reduce insulin resistance and improve patient outcomes. Please complete drinking 2 hours prior to scheduled arrival time.  TAKE THESE MEDICATIONS THE MORNING OF SURGERY WITH A SIP OF WATER:  - amLODipine (NORVASC) 5 MG tablet - citalopram (CELEXA) 40 MG tablet  One week prior to surgery: Stop Anti-inflammatories (NSAIDS) such as Advil, Aleve, Ibuprofen, Motrin, Naproxen, Naprosyn and Aspirin based products such as Excedrin, Goodys Powder, BC Powder.  Stop ANY OVER THE COUNTER supplements until after surgery.  You may take Tylenol if needed for pain up until the day of surgery.  No Alcohol for 24 hours before or after surgery.  No Smoking including e-cigarettes  for 24 hours prior to surgery.  No chewable tobacco products for at least 6 hours prior to surgery.  No nicotine patches on the day of surgery.  Do not use any "recreational" drugs for at least a week prior to your surgery.  Please be advised that the combination of cocaine and anesthesia may have negative outcomes, up to and including death. If you test positive for cocaine, your surgery will be cancelled.  On the morning of surgery brush your teeth with toothpaste and water, you may rinse your mouth with mouthwash if you wish. Do not swallow any toothpaste or mouthwash.  Use CHG Soap or wipes as directed on instruction sheet.  Do not wear jewelry, make-up, hairpins, clips or nail polish.  Do not wear lotions, powders, or perfumes.   Do not shave body from the neck down 48 hours prior to surgery just in case you cut yourself which could leave a site for infection.  Also, freshly shaved skin may become irritated if using the CHG soap.  Contact lenses, hearing aids and dentures may not be worn into surgery.  Do not bring valuables to the hospital. [facility] is not responsible for any missing/lost belongings or valuables.   Notify your doctor if there is any change in your medical condition (cold, fever, infection).  Wear comfortable clothing (specific to your surgery type) to the hospital.  After surgery, you can help prevent lung complications by doing breathing exercises.  Take deep breaths and cough every 1-2 hours. Your doctor  may order a device called an Incentive Spirometer to help you take deep breaths. When coughing or sneezing, hold a pillow firmly against your incision with both hands. This is called "splinting." Doing this helps protect your incision. It also decreases belly discomfort.  If you are being admitted to the hospital overnight, leave your suitcase in the car. After surgery it may be brought to your room.  If you are being discharged the day of surgery, you  will not be allowed to drive home. You will need a responsible adult (18 years or older) to drive you home and stay with you that night.   If you are taking public transportation, you will need to have a responsible adult (18 years or older) with you. Please confirm with your physician that it is acceptable to use public transportation.   Please call the [place] Dept. at [phone] if you have any questions about these instructions.  Surgery Visitation Policy:  Patients undergoing a surgery or procedure may have two family members or support persons with them as long as the person is not COVID-19 positive or experiencing its symptoms.   Inpatient Visitation:    Visiting hours are 7 a.m. to 8 p.m. Up to four visitors are allowed at one time in a patient room, including children. The visitors may rotate out with other people during the day. One designated support person (adult) may remain overnight.

## 2021-12-15 ENCOUNTER — Telehealth: Payer: Self-pay | Admitting: Urgent Care

## 2021-12-15 ENCOUNTER — Encounter: Payer: Self-pay | Admitting: Urgent Care

## 2021-12-15 ENCOUNTER — Encounter
Admission: RE | Admit: 2021-12-15 | Discharge: 2021-12-15 | Disposition: A | Discharge status: Home / Self Care | Payer: 59 | Source: Ambulatory Visit | Attending: Orthopedic Surgery | Admitting: Orthopedic Surgery

## 2021-12-15 DIAGNOSIS — E876 Hypokalemia: Secondary | ICD-10-CM | POA: Insufficient documentation

## 2021-12-15 DIAGNOSIS — Z01812 Encounter for preprocedural laboratory examination: Secondary | ICD-10-CM

## 2021-12-15 DIAGNOSIS — T502X5A Adverse effect of carbonic-anhydrase inhibitors, benzothiadiazides and other diuretics, initial encounter: Secondary | ICD-10-CM | POA: Diagnosis not present

## 2021-12-15 DIAGNOSIS — I10 Essential (primary) hypertension: Secondary | ICD-10-CM | POA: Diagnosis not present

## 2021-12-15 DIAGNOSIS — Z01818 Encounter for other preprocedural examination: Secondary | ICD-10-CM | POA: Diagnosis not present

## 2021-12-15 LAB — CBC
HCT: 43.7 % (ref 36.0–46.0)
Hemoglobin: 14.4 g/dL (ref 12.0–15.0)
MCH: 29 pg (ref 26.0–34.0)
MCHC: 33 g/dL (ref 30.0–36.0)
MCV: 88.1 fL (ref 80.0–100.0)
Platelets: 342 10*3/uL (ref 150–400)
RBC: 4.96 MIL/uL (ref 3.87–5.11)
RDW: 13.3 % (ref 11.5–15.5)
WBC: 10.5 10*3/uL (ref 4.0–10.5)
nRBC: 0 % (ref 0.0–0.2)

## 2021-12-15 LAB — BASIC METABOLIC PANEL
Anion gap: 8 (ref 5–15)
BUN: 19 mg/dL (ref 8–23)
CO2: 35 mmol/L — ABNORMAL HIGH (ref 22–32)
Calcium: 10.2 mg/dL (ref 8.9–10.3)
Chloride: 97 mmol/L — ABNORMAL LOW (ref 98–111)
Creatinine, Ser: 0.7 mg/dL (ref 0.44–1.00)
GFR, Estimated: >60 mL/min (ref >60)
Glucose, Bld: 120 mg/dL — ABNORMAL HIGH (ref 70–99)
Potassium: 2.5 mmol/L — CL (ref 3.5–5.1)
Sodium: 140 mmol/L (ref 135–145)

## 2021-12-15 MED ORDER — POTASSIUM CHLORIDE CRYS ER 20 MEQ PO TBCR: EXTENDED_RELEASE_TABLET | ORAL | 0 refills | Status: DC

## 2021-12-15 NOTE — Progress Notes (Addendum)
  [facility] Perioperative Services: Pre-Admission/Anesthesia Testing  Abnormal Lab Notification and Treatment Plan of Care   Date: 12/15/21  Name: Karysa Heft MRN:   [id number]  Re: Abnormal labs noted during PAT appointment   Notified:  Provider Name Provider Role Notification Mode  Hessie Knows, MD Orthopedics (Surgeon) Routed and/or faxed via Dudley Major, Prentiss Bells, PA-C Primary Care Provider Routed and/or faxed via [place] and Notes:  ABNORMAL LAB VALUE(S): Lab Results  Component Value Date   K 2.5 (LL) 12/15/2021   Edman Circle is scheduled for an elective RIGHT REMOVAL GANGLION OF WRIST; RELEASE TRIGGER FINGER/A-1 PULLEY on 12/21/2021. In review of her medication reconciliation, it is noted that the patient is taking prescribed diuretic medications (indapamide). Patient reported that she is taking the medication differently than what we have on her medication list; taking 2.5 mg in the am and 5 mg at bedtime (total of 7.5 mg daily). I have updated her medication list to reflect what the patient is actually taking.   Please note, in efforts to promote a safe and effective anesthetic course, per current guidelines/standards set by the [facility] anesthesia team, the minimal acceptable K+ level for the patient to proceed with general anesthesia is 3.0 mmol/L. With that being said, in efforts to avoid case being postponed, hypokalemia will be treated by PAT APP as follows:  Meds ordered this encounter  Medications   potassium chloride SA (KLOR-CON M) 20 MEQ tablet    Sig: Take 3 tablets (60 mEq) today, then 1 tablet (20 mEq) daily until supply exhausted. Be sure to take a dose on the morning of surgery.    Dispense:  9 tablet    Refill:  0   Order placed to have potassium rechecked on the day of surgery to ensure optimization of noted electrolyte derangement patient encouraged to follow-up with PCP postoperatively for repeat labs and  consideration of either a change and diuretic therapy or the addition of daily oral potassium supplementation. Abnormal result is being forwarded to primary attending surgeon and PCP to make him aware of the result and plan of care in place for this patient.  Encounter Diagnoses  Name Primary?   Pre-operative laboratory examination Yes   Diuretic-induced hypokalemia    ADDENDUM: 12/15/2021 at 1723 - received call from [place] to advise that after receiving the prescription that I had sent in (see above), they subsequently received a second prescription from Dr. Rudene Christians, MD with different administration instructions. I advised pharmacist was advised to fill with the prescription that was phoned in by MD, which was K-Dur 20 mEq BID x 6 days. Will send updated copy of note to surgeon and PCP to ensure that everyone is on the same page regarding what was prescribed to this patient.   Honor Loh, MSN, APRN, FNP-C, CEN [facility]  Peri-operative Services Nurse Practitioner Phone: [phone] Fax: [phone] 12/15/21 5:10 PM

## 2021-12-21 ENCOUNTER — Ambulatory Visit: Payer: 59 | Admitting: Anesthesiology

## 2021-12-21 ENCOUNTER — Ambulatory Visit
Admission: RE | Admit: 2021-12-21 | Discharge: 2021-12-21 | Disposition: A | Discharge status: Home / Self Care | Payer: 59 | Source: Ambulatory Visit | Attending: Orthopedic Surgery | Admitting: Orthopedic Surgery

## 2021-12-21 ENCOUNTER — Encounter
Admission: RE | Disposition: A | Discharge status: Home / Self Care | Payer: Self-pay | Source: Ambulatory Visit | Attending: Orthopedic Surgery

## 2021-12-21 ENCOUNTER — Other Ambulatory Visit: Payer: Self-pay

## 2021-12-21 ENCOUNTER — Encounter: Payer: Self-pay | Admitting: Orthopedic Surgery

## 2021-12-21 DIAGNOSIS — M65331 Trigger finger, right middle finger: Secondary | ICD-10-CM | POA: Insufficient documentation

## 2021-12-21 DIAGNOSIS — M67431 Ganglion, right wrist: Secondary | ICD-10-CM | POA: Diagnosis not present

## 2021-12-21 DIAGNOSIS — E876 Hypokalemia: Secondary | ICD-10-CM

## 2021-12-21 DIAGNOSIS — Z01812 Encounter for preprocedural laboratory examination: Secondary | ICD-10-CM

## 2021-12-21 DIAGNOSIS — M86641 Other chronic osteomyelitis, right hand: Secondary | ICD-10-CM | POA: Diagnosis not present

## 2021-12-21 DIAGNOSIS — M19041 Primary osteoarthritis, right hand: Secondary | ICD-10-CM | POA: Diagnosis not present

## 2021-12-21 DIAGNOSIS — I10 Essential (primary) hypertension: Secondary | ICD-10-CM

## 2021-12-21 HISTORY — PX: GANGLION CYST EXCISION: SHX1691

## 2021-12-21 HISTORY — PX: TRIGGER FINGER RELEASE: SHX641

## 2021-12-21 LAB — POCT I-STAT, CHEM 8
BUN: 21 mg/dL (ref 8–23)
Calcium, Ion: 1.18 mmol/L (ref 1.15–1.40)
Chloride: 101 mmol/L (ref 98–111)
Creatinine, Ser: 0.8 mg/dL (ref 0.44–1.00)
Glucose, Bld: 95 mg/dL (ref 70–99)
HCT: 39 % (ref 36.0–46.0)
Hemoglobin: 13.3 g/dL (ref 12.0–15.0)
Potassium: 3.3 mmol/L — ABNORMAL LOW (ref 3.5–5.1)
Sodium: 139 mmol/L (ref 135–145)
TCO2: 26 mmol/L (ref 22–32)

## 2021-12-21 SURGERY — EXCISION, GANGLION CYST, WRIST
Anesthesia: General | Site: Wrist | Laterality: Right

## 2021-12-21 MED ORDER — ONDANSETRON HCL 4 MG/2ML IJ SOLN: 4.0000 mg | Freq: Four times a day (QID) | INTRAMUSCULAR | Status: DC | PRN

## 2021-12-21 MED ORDER — MORPHINE SULFATE (PF) 2 MG/ML IV SOLN: 0.5000 mg | INTRAVENOUS | Status: DC | PRN

## 2021-12-21 MED ORDER — EPHEDRINE 5 MG/ML INJ
INTRAVENOUS | Status: AC
Start: 2021-12-21 — End: ?
  Filled 2021-12-21: qty 5

## 2021-12-21 MED ORDER — PROPOFOL 10 MG/ML IV BOLUS
INTRAVENOUS | Status: DC | PRN
  Administered 2021-12-21: 120 mg via INTRAVENOUS

## 2021-12-21 MED ORDER — FAMOTIDINE 20 MG PO TABS: 20.0000 mg | ORAL_TABLET | Freq: Once | ORAL | Status: AC

## 2021-12-21 MED ORDER — FENTANYL CITRATE (PF) 100 MCG/2ML IJ SOLN
INTRAMUSCULAR | Status: AC
  Filled 2021-12-21: qty 2

## 2021-12-21 MED ORDER — PROPOFOL 10 MG/ML IV BOLUS
INTRAVENOUS | Status: AC
Start: 2021-12-21 — End: ?
  Filled 2021-12-21: qty 20

## 2021-12-21 MED ORDER — ONDANSETRON HCL 4 MG PO TABS: 4.0000 mg | ORAL_TABLET | Freq: Four times a day (QID) | ORAL | Status: DC | PRN

## 2021-12-21 MED ORDER — SODIUM CHLORIDE 0.9 % IV SOLN: INTRAVENOUS | Status: DC

## 2021-12-21 MED ORDER — CHLORHEXIDINE GLUCONATE 0.12 % MT SOLN: 15.0000 mL | Freq: Once | OROMUCOSAL | Status: AC

## 2021-12-21 MED ORDER — FENTANYL CITRATE (PF) 100 MCG/2ML IJ SOLN
INTRAMUSCULAR | Status: DC | PRN
  Administered 2021-12-21: 50 ug via INTRAVENOUS

## 2021-12-21 MED ORDER — HYDROCODONE-ACETAMINOPHEN 5-325 MG PO TABS: 1.0000 | ORAL_TABLET | ORAL | Status: DC | PRN

## 2021-12-21 MED ORDER — LIDOCAINE HCL (PF) 1 % IJ SOLN
INTRAMUSCULAR | Status: AC
  Filled 2021-12-21: qty 30

## 2021-12-21 MED ORDER — MIDAZOLAM HCL 2 MG/2ML IJ SOLN
INTRAMUSCULAR | Status: AC
  Filled 2021-12-21: qty 2

## 2021-12-21 MED ORDER — PHENYLEPHRINE HCL (PRESSORS) 10 MG/ML IV SOLN
INTRAVENOUS | Status: DC | PRN
  Administered 2021-12-21: 80 ug via INTRAVENOUS

## 2021-12-21 MED ORDER — ONDANSETRON HCL 4 MG/2ML IJ SOLN
INTRAMUSCULAR | Status: AC
Start: 2021-12-21 — End: ?
  Filled 2021-12-21: qty 2

## 2021-12-21 MED ORDER — DEXAMETHASONE SODIUM PHOSPHATE 10 MG/ML IJ SOLN
INTRAMUSCULAR | Status: DC | PRN
  Administered 2021-12-21: 10 mg via INTRAVENOUS

## 2021-12-21 MED ORDER — HYDROCODONE-ACETAMINOPHEN 7.5-325 MG PO TABS: 1.0000 | ORAL_TABLET | ORAL | Status: DC | PRN

## 2021-12-21 MED ORDER — LIDOCAINE HCL (CARDIAC) PF 100 MG/5ML IV SOSY
PREFILLED_SYRINGE | INTRAVENOUS | Status: DC | PRN
  Administered 2021-12-21: 100 mg via INTRAVENOUS

## 2021-12-21 MED ORDER — MIDAZOLAM HCL 2 MG/2ML IJ SOLN
INTRAMUSCULAR | Status: DC | PRN
  Administered 2021-12-21: 2 mg via INTRAVENOUS

## 2021-12-21 MED ORDER — FENTANYL CITRATE (PF) 100 MCG/2ML IJ SOLN
25.0000 ug | INTRAMUSCULAR | Status: DC | PRN
  Administered 2021-12-21 (×3): 25 ug via INTRAVENOUS

## 2021-12-21 MED ORDER — LACTATED RINGERS IV SOLN: INTRAVENOUS | Status: DC

## 2021-12-21 MED ORDER — CEFAZOLIN SODIUM-DEXTROSE 2-4 GM/100ML-% IV SOLN
INTRAVENOUS | Status: AC
  Filled 2021-12-21: qty 100

## 2021-12-21 MED ORDER — CEFAZOLIN SODIUM-DEXTROSE 2-4 GM/100ML-% IV SOLN
2.0000 g | INTRAVENOUS | Status: AC
  Administered 2021-12-21: 2 g via INTRAVENOUS

## 2021-12-21 MED ORDER — HYDROCODONE-ACETAMINOPHEN 5-325 MG PO TABS
ORAL_TABLET | ORAL | Status: AC
  Administered 2021-12-21: 2 via ORAL
  Filled 2021-12-21: qty 2

## 2021-12-21 MED ORDER — ACETAMINOPHEN 325 MG PO TABS: 325.0000 mg | ORAL_TABLET | Freq: Four times a day (QID) | ORAL | Status: DC | PRN

## 2021-12-21 MED ORDER — ORAL CARE MOUTH RINSE
15.0000 mL | Freq: Once | OROMUCOSAL | Status: AC
Start: 2021-12-21 — End: 2021-12-21

## 2021-12-21 MED ORDER — 0.9 % SODIUM CHLORIDE (POUR BTL) OPTIME
TOPICAL | Status: DC | PRN
  Administered 2021-12-21: 500 mL

## 2021-12-21 MED ORDER — METOCLOPRAMIDE HCL 10 MG PO TABS: 5.0000 mg | ORAL_TABLET | Freq: Three times a day (TID) | ORAL | Status: DC | PRN

## 2021-12-21 MED ORDER — DEXAMETHASONE SODIUM PHOSPHATE 10 MG/ML IJ SOLN
INTRAMUSCULAR | Status: AC
Start: 2021-12-21 — End: ?
  Filled 2021-12-21: qty 1

## 2021-12-21 MED ORDER — ONDANSETRON HCL 4 MG/2ML IJ SOLN
INTRAMUSCULAR | Status: DC | PRN
  Administered 2021-12-21: 4 mg via INTRAVENOUS

## 2021-12-21 MED ORDER — FENTANYL CITRATE (PF) 100 MCG/2ML IJ SOLN
INTRAMUSCULAR | Status: AC
  Administered 2021-12-21: 25 ug via INTRAVENOUS
  Filled 2021-12-21: qty 2

## 2021-12-21 MED ORDER — BUPIVACAINE HCL (PF) 0.5 % IJ SOLN
INTRAMUSCULAR | Status: DC | PRN
  Administered 2021-12-21: 4 mL
  Administered 2021-12-21: 5 mL
  Administered 2021-12-21: 6 mL

## 2021-12-21 MED ORDER — METOCLOPRAMIDE HCL 5 MG/ML IJ SOLN: 5.0000 mg | Freq: Three times a day (TID) | INTRAMUSCULAR | Status: DC | PRN

## 2021-12-21 MED ORDER — FAMOTIDINE 20 MG PO TABS
ORAL_TABLET | ORAL | Status: AC
  Administered 2021-12-21: 20 mg via ORAL
  Filled 2021-12-21: qty 1

## 2021-12-21 MED ORDER — CHLORHEXIDINE GLUCONATE 0.12 % MT SOLN
OROMUCOSAL | Status: AC
  Administered 2021-12-21: 15 mL via OROMUCOSAL
  Filled 2021-12-21: qty 15

## 2021-12-21 MED ORDER — HYDROCODONE-ACETAMINOPHEN 5-325 MG PO TABS: 1.0000 | ORAL_TABLET | Freq: Four times a day (QID) | ORAL | 0 refills | Status: DC | PRN

## 2021-12-21 MED ORDER — BUPIVACAINE HCL (PF) 0.5 % IJ SOLN
INTRAMUSCULAR | Status: AC
Start: 2021-12-21 — End: ?
  Filled 2021-12-21: qty 30

## 2021-12-21 MED ORDER — EPHEDRINE SULFATE (PRESSORS) 50 MG/ML IJ SOLN
INTRAMUSCULAR | Status: DC | PRN
  Administered 2021-12-21: 10 mg via INTRAVENOUS

## 2021-12-21 SURGICAL SUPPLY — 41 items
APL PRP STRL LF DISP 70% ISPRP (MISCELLANEOUS) ×1
BNDG CMPR STD VLCR NS LF 5.8X2 (GAUZE/BANDAGES/DRESSINGS) ×1
BNDG CMPR STD VLCR NS LF 5.8X3 (GAUZE/BANDAGES/DRESSINGS) ×1
BNDG ELASTIC 2X5.8 VLCR NS LF (GAUZE/BANDAGES/DRESSINGS) ×2 IMPLANT
BNDG ELASTIC 2X5.8 VLCR STR LF (GAUZE/BANDAGES/DRESSINGS) ×2 IMPLANT
BNDG ELASTIC 3X5.8 VLCR NS LF (GAUZE/BANDAGES/DRESSINGS) ×2 IMPLANT
CAST PADDING 2X4YD ST 30245 (MISCELLANEOUS) ×1
CAST PADDING 3X4FT ST 30246 (SOFTGOODS) ×1
CHLORAPREP W/TINT 26 (MISCELLANEOUS) ×2 IMPLANT
CUFF TOURN SGL QUICK 18X4 (TOURNIQUET CUFF) IMPLANT
ELECT CAUTERY NDL 2.0 MIC (NEEDLE) ×1 IMPLANT
ELECT CAUTERY NEEDLE 2.0 MIC (NEEDLE) ×2 IMPLANT
GAUZE SPONGE 4X4 12PLY STRL (GAUZE/BANDAGES/DRESSINGS) ×2 IMPLANT
GAUZE XEROFORM 1X8 LF (GAUZE/BANDAGES/DRESSINGS) ×2 IMPLANT
GLOVE SURG SYN 9.0  PF PI (GLOVE) ×1
GLOVE SURG SYN 9.0 PF PI (GLOVE) ×1 IMPLANT
GOWN SRG 2XL LVL 4 RGLN SLV (GOWNS) ×1 IMPLANT
GOWN STRL NON-REIN 2XL LVL4 (GOWNS) ×2
GOWN STRL REUS W/ TWL LRG LVL3 (GOWN DISPOSABLE) ×1 IMPLANT
GOWN STRL REUS W/TWL LRG LVL3 (GOWN DISPOSABLE) ×2
KIT TURNOVER KIT A (KITS) ×2 IMPLANT
MANIFOLD NEPTUNE II (INSTRUMENTS) ×2 IMPLANT
NDL HYPO 25X1 1.5 SAFETY (NEEDLE) ×1 IMPLANT
NEEDLE HYPO 25X1 1.5 SAFETY (NEEDLE) ×2 IMPLANT
NS IRRIG 500ML POUR BTL (IV SOLUTION) ×2 IMPLANT
PACK EXTREMITY ARMC (MISCELLANEOUS) ×2 IMPLANT
PAD CAST CTTN 3X4 STRL (SOFTGOODS) ×1 IMPLANT
PADDING CAST COTTON 2X4 ST (MISCELLANEOUS) ×1 IMPLANT
PADDING CAST COTTON 3X4 STRL (SOFTGOODS) ×1
SCALPEL PROTECTED #15 DISP (BLADE) ×4 IMPLANT
SPLINT CAST 1 STEP 3X12 (MISCELLANEOUS) ×2 IMPLANT
SPONGE GAUZE 2X2 8PLY STRL LF (GAUZE/BANDAGES/DRESSINGS) ×2 IMPLANT
SUT ETHILON 4 0 P 3 18 (SUTURE) ×2 IMPLANT
SUT ETHILON 4-0 (SUTURE)
SUT ETHILON 4-0 FS2 18XMFL BLK (SUTURE)
SUT ETHILON 5-0 FS-2 18 BLK (SUTURE) ×2 IMPLANT
SUT MNCRL 4-0 (SUTURE)
SUT MNCRL 4-018XMFL (SUTURE)
SUTURE ETHLN 4-0 FS2 18XMF BLK (SUTURE) ×1 IMPLANT
SUTURE MNCRL 4-018XMF (SUTURE) ×1 IMPLANT
WATER STERILE IRR 500ML POUR (IV SOLUTION) ×2 IMPLANT

## 2021-12-21 NOTE — Transfer of Care (Signed)
Immediate Anesthesia Transfer of Care Note  Patient: Ashley Collins  Procedure(s) Performed: REMOVAL GANGLION OF WRIST (Right: Wrist) RELEASE TRIGGER FINGER/A-1 PULLEY (Right: Wrist)  Patient Location: PACU  Anesthesia Type:General  Level of Consciousness: drowsy  Airway & Oxygen Therapy: Patient Spontanous Breathing and Patient connected to face mask oxygen  Post-op Assessment: Report given to RN and Post -op Vital signs reviewed and stable  Post vital signs: Reviewed and stable  Last Vitals:  Vitals Value Taken Time  BP 117/64 12/21/21 1630  Temp 36.1 C 12/21/21 1616  Pulse 72 12/21/21 1635  Resp 17 12/21/21 1635  SpO2 95 % 12/21/21 1635  Vitals shown include unvalidated device data.  Last Pain:  Vitals:   12/21/21 1629  TempSrc:   PainSc: 7       Patients Stated Pain Goal: 2 (12/21/21 1629)  Complications: No notable events documented.

## 2021-12-22 ENCOUNTER — Encounter: Payer: Self-pay | Admitting: Orthopedic Surgery

## 2021-12-22 NOTE — Anesthesia Postprocedure Evaluation (Signed)
Anesthesia Post Note  Patient: Ashley Collins  Procedure(s) Performed: REMOVAL GANGLION OF WRIST (Right: Wrist) RELEASE TRIGGER FINGER/A-1 PULLEY (Right: Wrist)  Patient location during evaluation: PACU Anesthesia Type: General Level of consciousness: awake and alert Pain management: pain level controlled Vital Signs Assessment: post-procedure vital signs reviewed and stable Respiratory status: spontaneous breathing, nonlabored ventilation and respiratory function stable Cardiovascular status: blood pressure returned to baseline and stable Postop Assessment: no apparent nausea or vomiting Anesthetic complications: no   No notable events documented.   Last Vitals:  Vitals:   12/21/21 1711 12/21/21 1721  BP: 123/67 (!) 149/78  Pulse: 75 91  Resp: 10 20  Temp: (!) 36.1 C (!) 36.2 C  SpO2: 94% 94%    Last Pain:  Vitals:   12/21/21 1721  TempSrc: Temporal  PainSc: [place]

## 2021-12-24 ENCOUNTER — Ambulatory Visit
Admission: RE | Admit: 2021-12-24 | Discharge: 2021-12-24 | Disposition: A | Discharge status: Home / Self Care | Payer: 59 | Source: Ambulatory Visit | Attending: Obstetrics and Gynecology | Admitting: Obstetrics and Gynecology

## 2021-12-24 DIAGNOSIS — Z1231 Encounter for screening mammogram for malignant neoplasm of breast: Secondary | ICD-10-CM | POA: Insufficient documentation

## 2021-12-24 DIAGNOSIS — M67431 Ganglion, right wrist: Secondary | ICD-10-CM | POA: Diagnosis not present

## 2022-02-02 ENCOUNTER — Encounter: Payer: Self-pay | Admitting: Psychiatry

## 2022-02-02 ENCOUNTER — Ambulatory Visit: Payer: 59 | Admitting: Psychiatry

## 2022-02-02 VITALS — BP 110/72 | HR 103 | Temp 98.1°F | Wt 127.4 lb

## 2022-02-02 DIAGNOSIS — F411 Generalized anxiety disorder: Secondary | ICD-10-CM | POA: Diagnosis not present

## 2022-02-02 DIAGNOSIS — F32A Depression, unspecified: Secondary | ICD-10-CM | POA: Diagnosis not present

## 2022-02-02 DIAGNOSIS — Z79899 Other long term (current) drug therapy: Secondary | ICD-10-CM | POA: Insufficient documentation

## 2022-02-02 DIAGNOSIS — F3289 Other specified depressive episodes: Secondary | ICD-10-CM | POA: Insufficient documentation

## 2022-02-02 MED ORDER — CITALOPRAM HYDROBROMIDE 40 MG PO TABS: 20.0000 mg | ORAL_TABLET | Freq: Every day | ORAL | 0 refills | Status: DC

## 2022-02-02 MED ORDER — HYDROXYZINE HCL 10 MG PO TABS: 10.0000 mg | ORAL_TABLET | Freq: Two times a day (BID) | ORAL | 1 refills | Status: DC | PRN

## 2022-02-02 MED ORDER — SERTRALINE HCL 25 MG PO TABS: 25.0000 mg | ORAL_TABLET | Freq: Every day | ORAL | 1 refills | Status: DC

## 2022-02-02 NOTE — Progress Notes (Unsigned)
Psychiatric Initial Adult Assessment   Patient Identification: Ashley Collins MRN:  [id number] Date of Evaluation:  02/02/2022 Referral Source: Paulita Cradle MD Chief Complaint:   Chief Complaint  Patient presents with   Establish Care: 61 year old Caucasian female with history of anxiety, depression, presented to establish care.   Visit Diagnosis:    ICD-10-CM   1. GAD (generalized anxiety disorder)  F41.1 citalopram (CELEXA) 40 MG tablet    sertraline (ZOLOFT) 25 MG tablet    hydrOXYzine (ATARAX) 10 MG tablet    Urine drugs of abuse scrn w alc, routine (Ref Lab)    2. Depression, unspecified depression type  F32.A citalopram (CELEXA) 40 MG tablet    sertraline (ZOLOFT) 25 MG tablet    Urine drugs of abuse scrn w alc, routine (Ref Lab)    3. High risk medication use  Z79.899 Urine drugs of abuse scrn w alc, routine (Ref Lab)    4. Long-term current use of benzodiazepine  Z79.899 Urine drugs of abuse scrn w alc, routine (Ref Lab)      History of Present Illness:  Ashley Collins is a 61 year old Caucasian female, currently on SSI, married, lives in [place], has a history of anxiety, depression, migraine headaches, kidney stones, presented to establish care.  Patient reports she moved to [place] from [place] 1-1/2 years ago.  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  Patient reports with the pandemic she had to shut down her business and she lost everything including her home.  Patient reports she hence moved here to [place] since her daughter was working here.  Patient reports that has been very stressful for her since she had to work different jobs however was able to finally start receiving SSI.  She also reports she went through the loss of her mother 7 years ago from cancer, her mother was her" rock".  Patient reports her husband who is 22 years older was diagnosed with early Alzheimer's dementia.  His  sister who passed away was diagnosed with Alzheimer's dementia at the age of 63.  She reports this is a big adjustment for her.  Patient reports she is also worried about her younger children who are 11 and 68 years old, a son and a daughter.  They are not doing well, daughter is struggling with mental health and substance abuse and her son likely is depressed and lives with her and her husband.  That has been stressful for her since she wants everything to be perfect and she tries to help everyone and this has been affecting her emotionally.  Patient reports she worries about things in general, especially her children and her husband, financial situation is often nervous, anxious, has trouble relaxing, and is often worried about something bad happening.  This has been going on since the past several months.  Currently on Celexa, unknown if it is helpful at this time.  Has been on it since the past 15 years or so.  Reports anxiety attacks when she has stressful situational stressors when she has chest pain and feels extremely anxious for several minutes to hours.  Patient also reports she takes Xanax prescribed as 1 mg twice a day as needed however is currently weaning herself off of it and takes 1 mg once a day and skips it several times a week when she can.  Patient also reports depressive symptoms like sadness, low motivation, concentration problems, sleep problems.  Getting worse since the  past several months.  Reports she goes to bed at around 10 PM and wakes at 3 AM if she does not have her Ambien.  She is currently taking Ambien 10 mg although it was prescribed as 5 mg by her provider recently.  Since being on the 10 mg she has been sleeping 7 to 8 hours and she would like to be on this dosage.  Patient denies any history of emotional or physical or sexual trauma.  Patient denies any hallucinations, suicidality or homicidality.  Patient denies any other concerns today.   Associated  Signs/Symptoms: Depression Symptoms:  depressed mood, anhedonia, insomnia, difficulty concentrating, anxiety, (Hypo) Manic Symptoms:   Denies Anxiety Symptoms:  Excessive Worry, Psychotic Symptoms:   Denies PTSD Symptoms: Negative  Past Psychiatric History: Patient denies inpatient behavioral health admissions.  Denies suicide attempts.  Was under the care of psychiatrist in [place] previously.  Most recently medications were prescribed by her primary care provider.  Reports a previous diagnosis of anxiety and depression.  Previous Psychotropic Medications: Yes Celexa, Xanax, Ambien  Substance Abuse History in the last 12 months:  No.  However per review of notes per cardiology-dated 01/26/2022-Dr.Paraschos -history of previous cocaine abuse-this needs to be explored in future sessions.  Patient denied any history of substance abuse today.  Consequences of Substance Abuse: Negative  Past Medical History:  Past Medical History:  Diagnosis Date   Cancer ([place])    basal cell   Complication of anesthesia    nausea and vomiting   Headache    History of kidney stones    Hypertension    Kidney stones    PONV (postoperative nausea and vomiting)     Past Surgical History:  Procedure Laterality Date   AUGMENTATION MAMMAPLASTY     CESAREAN SECTION     x 4   CYSTOSCOPY W/ RETROGRADES Bilateral 07/10/2020   Procedure: CYSTOSCOPY WITH RETROGRADE PYELOGRAM;  Surgeon: Billey Co, MD;  Location: [facility] ORS;  Service: Urology;  Laterality: Bilateral;   CYSTOSCOPY/URETEROSCOPY/HOLMIUM LASER/STENT PLACEMENT     CYSTOSCOPY/URETEROSCOPY/HOLMIUM LASER/STENT PLACEMENT Bilateral 07/10/2020   Procedure: CYSTOSCOPY/URETEROSCOPY/HOLMIUM LASER/STENT PLACEMENT;  Surgeon: Billey Co, MD;  Location: [facility] OR[facility];  Service: Urology;  Laterality: Bilateral;   EXTRACORPOREAL SHOCK WAVE LITHOTRIPSY     x 10 plus   Eye Lift     FACIAL COSMETIC SURGERY     GANGLION CYST EXCISION Right 12/21/2021    Procedure: REMOVAL GANGLION OF WRIST;  Surgeon: Hessie Knows, MD;  Location: [facility] ORS;  Service: Orthopedics;  Laterality: Right;   LIPOSUCTION     PLACEMENT OF BREAST IMPLANTS     TONSILLECTOMY     TRIGGER FINGER RELEASE Right 12/21/2021   Procedure: RELEASE TRIGGER FINGER/A-1 PULLEY;  Surgeon: Hessie Knows, MD;  Location: [facility] ORS;  Service: Orthopedics;  Laterality: Right;   URETEROSCOPY WITH HOLMIUM LASER LITHOTRIPSY      Family Psychiatric History: As noted below.  Family History:  Family History  Problem Relation Age of Onset   Bladder Cancer Neg Hx    Kidney cancer Neg Hx    Prostate cancer Neg Hx     Social History:   Social History   Socioeconomic History   Marital status: Married    Spouse name: james   Number of children: 4   Years of education: Not on file   Highest education level: High school graduate  Occupational History   Not on file  Tobacco Use   Smoking status: Never    Passive exposure:  Never   Smokeless tobacco: Never  Vaping Use   Vaping Use: Never used  Substance and Sexual Activity   Alcohol use: Never   Drug use: Never   Sexual activity: Yes  Other Topics Concern   Not on file  Social History Narrative   Not on file   Social Determinants of Health   Financial Resource Strain: Not on file  Food Insecurity: Not on file  Transportation Needs: Not on file  Physical Activity: Not on file  Stress: Not on file  Social Connections: Not on file    Additional Social History: Patient was born and raised in [place].  Patient reports she graduated high school.  She got married early, started living with her husband, lived together for 8 years and later on got married and they had been married for the past 62 years.  Patient has 4 children, 54 year old daughter, 23 year old daughter, 68 year old daughter and 7 year old son.  Patient has 5 step siblings and has a good relationship with them.  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  Currently on SSI, lives in [place] with her husband.  Denies any legal problems.  Allergies:   Allergies  Allergen Reactions   Percocet [Oxycodone-Acetaminophen] Itching    Metabolic Disorder Labs: No results found for: "HGBA1C", "MPG" No results found for: "PROLACTIN" No results found for: "CHOL", "TRIG", "HDL", "CHOLHDL", "VLDL", "LDLCALC" No results found for: "TSH"  Therapeutic Level Labs: No results found for: "LITHIUM" No results found for: "CBMZ" No results found for: "VALPROATE"  Current Medications: Current Outpatient Medications  Medication Sig Dispense Refill   ALPRAZolam (XANAX) 1 MG tablet Take 1 mg by mouth 2 (two) times daily as needed for anxiety.     amitriptyline (ELAVIL) 10 MG tablet Take 10 mg by mouth at bedtime.     amLODipine (NORVASC) 5 MG tablet Take 2.5-5 mg by mouth as directed. Takes 7.5 mg daily     hydrOXYzine (ATARAX) 10 MG tablet Take 1-2 tablets (10-20 mg total) by mouth 2 (two) times daily as needed. For severe anxiety attacks only 60 tablet 1   indapamide (LOZOL) 2.5 MG tablet Take 2.5 mg by mouth daily.     sertraline (ZOLOFT) 25 MG tablet Take 1 tablet (25 mg total) by mouth daily with breakfast. 30 tablet 1   zolpidem (AMBIEN) 10 MG tablet Take 5 mg by mouth at bedtime as needed for sleep.     citalopram (CELEXA) 40 MG tablet Take 0.5 tablets (20 mg total) by mouth daily. 90 tablet 0   No current facility-administered medications for this visit.    Musculoskeletal: Strength & Muscle Tone: within normal limits Gait & Station: normal Patient leans: N/A  Psychiatric Specialty Exam: Review of Systems  Psychiatric/Behavioral:  Positive for decreased concentration and dysphoric mood. The patient is nervous/anxious.   All other systems reviewed and are negative.   Blood pressure 110/72, pulse (!) 103, temperature 98.1 F (36.7 C), temperature source Temporal, weight 127 lb 6.4 oz (57.8 kg).Body mass index is 23.3 kg/m.   General Appearance: Casual  Eye Contact:  Good  Speech:  Clear and Coherent  Volume:  Normal  Mood:  Anxious and Depressed  Affect:  Congruent  Thought Process:  Goal Directed and Descriptions of Associations: Intact  Orientation:  Full (Time, Place, and Person)  Thought Content:  Logical  Suicidal Thoughts:  No  Homicidal Thoughts:  No  Memory:  Immediate;   Fair Recent;   Fair Remote;  Fair  Judgement:  Fair  Insight:  Fair  Psychomotor Activity:  Normal  Concentration:  Concentration: Fair and Attention Span: Fair  Recall:  [employer] of Knowledge:Fair  Language: Fair  Akathisia:  No  Handed:  Right  AIMS (if indicated):  done  Assets:  Communication Skills Desire for Improvement Housing Intimacy Social Support  ADL's:  Intact  Cognition: WNL  Sleep:   fair on ambien   Screenings: Flowsheet Row Admission (Discharged) from 12/21/2021 in [facility] Testing 45 from 12/14/2021 in [facility] from 11/21/2021 in [facility] No Risk No Risk No Risk       Assessment and Plan: Shanquita Ronning is a 61 year old Caucasian female, on SSI, married, lives in [place], has a history of anxiety, depression, migraine headaches, was evaluated in office today.  Patient is currently struggling with multiple psychosocial stressors including her husband being diagnosed with early dementia, son and daughter going through mental health problems substance abuse.  Patient currently with worsening mood symptoms.  Patient will benefit from medication management and psychotherapy sessions. The patient demonstrates the following risk factors for suicide: Chronic risk factors for suicide include: psychiatric disorder of anxiety, depression . Acute risk factors for suicide include: loss (financial, interpersonal, professional). Protective  factors for this patient include: positive social support, positive therapeutic relationship, and hope for the future. Considering these factors, the overall suicide risk at this point appears to be low. Patient is appropriate for outpatient follow up.  Plan GAD-unstable Taper of Celexa, reduce Celexa to 20 mg p.o. daily. Start sertraline 25 mg p.o. daily with breakfast.  Will increase the dosage gradually. Provided medication education, discussed serotonin syndrome. Continue Xanax currently prescribed as 1 mg twice a day as needed although she takes 1 mg daily as needed and skips several times a week.  Reviewed [place] PMP aware Start hydroxyzine 10 to 20 mg p.o. twice daily as needed for severe anxiety symptoms Provided medication education.  Depression unspecified-rule out MDD-unstable Start sertraline 25 mg p.o. daily with breakfast Continue zolpidem 10 mg p.o. nightly as needed-however will get urine drug screen prior to giving this prescription. Reviewed [place] PMP aware  High risk medication use-will order urine drug screen.  Patient with history of long-term use of benzodiazepine, will benefit from urine drug screen and education was provided.  Patient is also on amitriptyline 10 mg at bedtime which she reports she has been taking for the past several years and has been helpful with her migraine headaches.  Could continue the same.  Reviewed notes per Dr. Peyton Najjar 01/26/2022-cardiology-patient was continued on amlodipine.  Follow-up in clinic in 3 to 4 weeks or sooner if needed.  Reviewed notes per Dr. Avon Gully 01/06/2022-patient was continued on Celexa 40 mg.  This note was generated in part or whole with voice recognition software. Voice recognition is usually quite accurate but there are transcription errors that can and very often do occur. I apologize for any typographical errors that were not detected and corrected.         Ursula Alert, MD 8/9/20234:28 PM

## 2022-02-02 NOTE — Patient Instructions (Signed)
[email or web address]  [email or web address]   [facility] [place] [phone]  [facility] [phone]    Dr. Kerin Salen [phone]   Hydroxyzine Capsules or Tablets What is this medication? HYDROXYZINE (hye Hartman i zeen) treats the symptoms of allergies and allergic reactions. It may also be used to treat anxiety or cause drowsiness before a procedure. It works by blocking histamine, a substance released by the body during an allergic reaction. It belongs to a group of medications called antihistamines. This medicine may be used for other purposes; ask your health care provider or pharmacist if you have questions. COMMON BRAND NAME(S): ANX, Atarax, Rezine, Vistaril What should I tell my care team before I take this medication? They need to know if you have any of these conditions: Glaucoma Heart disease History of irregular heartbeat Kidney disease Liver disease Lung or breathing disease, like asthma Stomach or intestine problems Thyroid disease Trouble passing urine An unusual or allergic reaction to hydroxyzine, cetirizine, other medications, foods, dyes or preservatives Pregnant or trying to get pregnant Breast-feeding How should I use this medication? Take this medication by mouth with a full glass of water. Follow the directions on the prescription label. You may take this medication with food or on an empty stomach. Take your medication at regular intervals. Do not take your medication more often than directed. Talk to your care team regarding the use of this medication in children. Special care may be needed. While this medication may be prescribed for children as young as 66 years of age for selected conditions, precautions do apply. Patients over 87 years old may have a stronger reaction and need a smaller dose. Overdosage: If you think you have taken too much of this medicine contact a poison control  center or emergency room at once. NOTE: This medicine is only for you. Do not share this medicine with others. What if I miss a dose? If you miss a dose, take it as soon as you can. If it is almost time for your next dose, take only that dose. Do not take double or extra doses. What may interact with this medication? Do not take this medication with any of the following: Cisapride Dronedarone Pimozide Thioridazine This medication may also interact with the following: Alcohol Antihistamines for allergy, cough, and cold Atropine Barbiturate medications for sleep or seizures, like phenobarbital Certain antibiotics like erythromycin or clarithromycin Certain medications for anxiety or sleep Certain medications for bladder problems like oxybutynin, tolterodine Certain medications for depression or psychotic disturbances Certain medications for irregular heart beat Certain medications for Parkinson's disease like benztropine, trihexyphenidyl Certain medications for seizures like phenobarbital, primidone Certain medications for stomach problems like dicyclomine, hyoscyamine Certain medications for travel sickness like scopolamine Ipratropium Narcotic medications for pain Other medications that prolong the QT interval (which can cause an abnormal heart rhythm) like dofetilide This list may not describe all possible interactions. Give your health care provider a list of all the medicines, herbs, non-prescription drugs, or dietary supplements you use. Also tell them if you smoke, drink alcohol, or use illegal drugs. Some items may interact with your medicine. What should I watch for while using this medication? Tell your care team if your symptoms do not improve. You may get drowsy or dizzy. Do not drive, use machinery, or do anything that needs mental alertness until you know how this medication affects you. Do not stand or sit  up quickly, especially if you are an older patient. This reduces the  risk of dizzy or fainting spells. Alcohol may interfere with the effect of this medication. Avoid alcoholic drinks. Your mouth may get dry. Chewing sugarless gum or sucking hard candy, and drinking plenty of water may help. Contact your care team if the problem does not go away or is severe. This medication may cause dry eyes and blurred vision. If you wear contact lenses you may feel some discomfort. Lubricating drops may help. See your eye care specialist if the problem does not go away or is severe. If you are receiving skin tests for allergies, tell your care team you are using this medication. What side effects may I notice from receiving this medication? Side effects that you should report to your care team as soon as possible: Allergic reactions--skin rash, itching, hives, swelling of the face, lips, tongue, or throat Heart rhythm changes--fast or irregular heartbeat, dizziness, feeling faint or lightheaded, chest pain, trouble breathing Side effects that usually do not require medical attention (report to your care team if they continue or are bothersome): Confusion Drowsiness Dry mouth Hallucinations Headache This list may not describe all possible side effects. Call your doctor for medical advice about side effects. You may report side effects to FDA at 1-800-FDA-1088. Where should I keep my medication? Keep out of the reach of children and pets. Store at room temperature between 15 and 30 degrees C (59 and 86 degrees F). Keep container tightly closed. Throw away any unused medication after the expiration date. NOTE: This sheet is a summary. It may not cover all possible information. If you have questions about this medicine, talk to your doctor, pharmacist, or health care provider.  2023 Elsevier/Gold Standard (2020-07-16 00:00:00) Sertraline Tablets What is this medication? SERTRALINE (SER tra leen) treats depression, anxiety, obsessive-compulsive disorder (OCD), post-traumatic  stress disorder (PTSD), and premenstrual dysphoric disorder (PMDD). It increases the amount of serotonin in the brain, a hormone that helps regulate mood. It belongs to a group of medications called SSRIs. This medicine may be used for other purposes; ask your health care provider or pharmacist if you have questions. COMMON BRAND NAME(S): Zoloft What should I tell my care team before I take this medication? They need to know if you have any of these conditions: Bleeding disorders Bipolar disorder or a family history of bipolar disorder Frequently drink alcohol Glaucoma Heart disease High blood pressure History of irregular heartbeat History of low levels of calcium, magnesium, or potassium in the blood Liver disease Receiving electroconvulsive therapy Seizures Suicidal thoughts, plans, or attempt; a previous suicide attempt by you or a family member Take medications that prevent or treat blood clots Thyroid disease An unusual or allergic reaction to sertraline, other medications, foods, dyes, or preservatives Pregnant or trying to get pregnant Breast-feeding How should I use this medication? Take this medication by mouth with a glass of water. Follow the directions on the prescription label. You can take it with or without food. Take your medication at regular intervals. Do not take your medication more often than directed. Do not stop taking this medication suddenly except upon the advice of your care team. Stopping this medication too quickly may cause serious side effects or your condition may worsen. A special MedGuide will be given to you by the pharmacist with each prescription and refill. Be sure to read this information carefully each time. Talk to your care team about the use of this medication in children. While  this medication may be prescribed for children as young as 7 years for selected conditions, precautions do apply. Overdosage: If you think you have taken too much of this  medicine contact a poison control center or emergency room at once. NOTE: This medicine is only for you. Do not share this medicine with others. What if I miss a dose? If you miss a dose, take it as soon as you can. If it is almost time for your next dose, take only that dose. Do not take double or extra doses. What may interact with this medication? Do not take this medication with any of the following: Cisapride Dronedarone Linezolid MAOIs like Carbex, Eldepryl, Marplan, Nardil, and Parnate Methylene blue (injected into a vein) Pimozide Thioridazine This medication may also interact with the following: Alcohol Amphetamines Aspirin and aspirin-like medications Certain medications for depression, anxiety, or other mental health conditions Certain medications for fungal infections like ketoconazole, fluconazole, posaconazole, and itraconazole Certain medications for irregular heart beat like flecainide, quinidine, propafenone Certain medications for migraine headaches like almotriptan, eletriptan, frovatriptan, naratriptan, rizatriptan, sumatriptan, zolmitriptan Certain medications for sleep Certain medications for seizures like carbamazepine, valproic acid, phenytoin Certain medications that treat or prevent blood clots like warfarin, enoxaparin, dalteparin Cimetidine Digoxin Diuretics Fentanyl Isoniazid Lithium NSAIDs, medications for pain and inflammation, like ibuprofen or naproxen Other medications that prolong the QT interval (cause an abnormal heart rhythm) like dofetilide Rasagiline Safinamide Supplements like St. John's wort, kava kava, valerian Tolbutamide Tramadol Tryptophan This list may not describe all possible interactions. Give your health care provider a list of all the medicines, herbs, non-prescription drugs, or dietary supplements you use. Also tell them if you smoke, drink alcohol, or use illegal drugs. Some items may interact with your medicine. What should  I watch for while using this medication? Tell your care team if your symptoms do not get better or if they get worse. Visit your care team for regular checks on your progress. Because it may take several weeks to see the full effects of this medication, it is important to continue your treatment as prescribed by your care team. Patients and their families should watch out for new or worsening thoughts of suicide or depression. Also watch out for sudden changes in feelings such as feeling anxious, agitated, panicky, irritable, hostile, aggressive, impulsive, severely restless, overly excited and hyperactive, or not being able to sleep. If this happens, especially at the beginning of treatment or after a change in dose, call your care team. This medication may affect your coordination, reaction time, or judgment. Do not drive or operate machinery until you know how this medication affects you. Sit or stand up slowly to reduce the risk of dizzy or fainting spells. Drinking alcohol with this medication can increase the risk of these side effects. Your mouth may get dry. Chewing sugarless gum or sucking hard candy, and drinking plenty of water may help. Contact your care team if the problem does not go away or is severe. What side effects may I notice from receiving this medication? Side effects that you should report to your care team as soon as possible: Allergic reactions--skin rash, itching, hives, swelling of the face, lips, tongue, or throat Bleeding--bloody or black, tar-like stools, red or dark brown urine, vomiting blood or brown material that looks like coffee grounds, small red or purple spots on skin, unusual bleeding or bruising Heart rhythm changes--fast or irregular heartbeat, dizziness, feeling faint or lightheaded, chest pain, trouble breathing Low sodium level--muscle weakness, fatigue,  dizziness, headache, confusion Serotonin syndrome--irritability, confusion, fast or irregular heartbeat,  muscle stiffness, twitching muscles, sweating, high fever, seizure, chills, vomiting, diarrhea Sudden eye pain or change in vision such as blurred vision, seeing halos around lights, vision loss Thoughts of suicide or self-harm, worsening mood Side effects that usually do not require medical attention (report these to your care team if they continue or are bothersome): Change in sex drive or performance Diarrhea Excessive sweating Nausea Tremors or shaking Upset stomach This list may not describe all possible side effects. Call your doctor for medical advice about side effects. You may report side effects to FDA at 1-800-FDA-1088. Where should I keep my medication? Keep out of the reach of children and pets. Store at room temperature between 15 and 30 degrees C (59 and 86 degrees F). Get rid of any unused medication after the expiration date. To get rid of medications that are no longer needed or expired: Take the medication to a medication take-back program. Check with your pharmacy or law enforcement to find a location. If you cannot return the medication, check the label or package insert to see if the medication should be thrown out in the garbage or flushed down the toilet. If you are not sure, ask your care team. If it is safe to put in the trash, empty the medication out of the container. Mix the medication with cat litter, dirt, coffee grounds, or other unwanted substance. Seal the mixture in a bag or container. Put it in the trash. NOTE: This sheet is a summary. It may not cover all possible information. If you have questions about this medicine, talk to your doctor, pharmacist, or health care provider.  2023 Elsevier/Gold Standard (2007-08-04 00:00:00)

## 2022-02-03 ENCOUNTER — Encounter: Payer: Self-pay | Admitting: Psychiatry

## 2022-02-08 ENCOUNTER — Other Ambulatory Visit
Admission: RE | Admit: 2022-02-08 | Discharge: 2022-02-08 | Disposition: A | Discharge status: Home / Self Care | Payer: 59 | Attending: Psychiatry | Admitting: Psychiatry

## 2022-02-08 DIAGNOSIS — Z79899 Other long term (current) drug therapy: Secondary | ICD-10-CM | POA: Diagnosis not present

## 2022-02-08 DIAGNOSIS — F411 Generalized anxiety disorder: Secondary | ICD-10-CM | POA: Insufficient documentation

## 2022-02-08 DIAGNOSIS — F32A Depression, unspecified: Secondary | ICD-10-CM | POA: Diagnosis not present

## 2022-02-11 ENCOUNTER — Telehealth: Payer: Self-pay

## 2022-02-11 DIAGNOSIS — F411 Generalized anxiety disorder: Secondary | ICD-10-CM

## 2022-02-11 LAB — DRUG PROFILE 799031: BENZODIAZEPINES: NEGATIVE

## 2022-02-11 LAB — URINE DRUGS OF ABUSE SCREEN W ALC, ROUTINE (REF LAB)
Amphetamines, Urine: NEGATIVE ng/mL
Barbiturate, Ur: NEGATIVE ng/mL
Cannabinoid Quant, Ur: NEGATIVE ng/mL
Cocaine (Metab.): NEGATIVE ng/mL
Ethanol U, Quan: NEGATIVE %
Methadone Screen, Urine: NEGATIVE ng/mL
Opiate Quant, Ur: NEGATIVE ng/mL
Phencyclidine, Ur: NEGATIVE ng/mL
Propoxyphene, Urine: NEGATIVE ng/mL

## 2022-02-11 NOTE — Telephone Encounter (Signed)
pt states she needs the ambien '10mg'$  sent into the pharmacy she states that she had her labwork done and she is out of medication.

## 2022-02-12 ENCOUNTER — Other Ambulatory Visit: Payer: Self-pay | Admitting: Psychiatry

## 2022-02-12 NOTE — Telephone Encounter (Signed)
According to the database, she filled Ambien 5 mg on 8/7 for 30 tabs. It should last until 8/21 even if she were to take 10 mg at night. Will defer the refill to Dr. Shea Evans.

## 2022-02-14 ENCOUNTER — Telehealth: Payer: Self-pay | Admitting: Psychiatry

## 2022-02-14 DIAGNOSIS — G4700 Insomnia, unspecified: Secondary | ICD-10-CM

## 2022-02-14 MED ORDER — ZOLPIDEM TARTRATE 10 MG PO TABS: 10.0000 mg | ORAL_TABLET | Freq: Every evening | ORAL | 2 refills | Status: DC | PRN

## 2022-02-14 MED ORDER — ALPRAZOLAM 1 MG PO TABS: 1.0000 mg | ORAL_TABLET | ORAL | 1 refills | Status: DC

## 2022-02-14 NOTE — Telephone Encounter (Signed)
done

## 2022-02-14 NOTE — Telephone Encounter (Signed)
I have sent Xanax to pharmacy with date specified, she will only be due next month.  Patient to limit use.

## 2022-02-14 NOTE — Telephone Encounter (Signed)
I have reviewed urine drug screen.  I have sent zolpidem 10 mg to pharmacy-30-day supply with 2 refills to [facility].

## 2022-02-14 NOTE — Telephone Encounter (Signed)
Pt was called and given information.  

## 2022-02-14 NOTE — Telephone Encounter (Signed)
pt called left message that she had her urine test done and wanted to know if medication was going to be sent in.

## 2022-02-14 NOTE — Telephone Encounter (Signed)
spoke with patient gave the information that rx was sent into pharmacy

## 2022-02-15 ENCOUNTER — Ambulatory Visit: Payer: 59 | Admitting: Psychiatry

## 2022-02-24 ENCOUNTER — Ambulatory Visit: Payer: 59 | Admitting: Family

## 2022-03-08 ENCOUNTER — Emergency Department: Payer: 59

## 2022-03-08 ENCOUNTER — Emergency Department
Admission: EM | Admit: 2022-03-08 | Discharge: 2022-03-08 | Disposition: A | Discharge status: Home / Self Care | Payer: 59 | Attending: Emergency Medicine | Admitting: Emergency Medicine

## 2022-03-08 DIAGNOSIS — R519 Headache, unspecified: Secondary | ICD-10-CM | POA: Diagnosis present

## 2022-03-08 DIAGNOSIS — Z20822 Contact with and (suspected) exposure to covid-19: Secondary | ICD-10-CM | POA: Insufficient documentation

## 2022-03-08 DIAGNOSIS — Z85828 Personal history of other malignant neoplasm of skin: Secondary | ICD-10-CM | POA: Diagnosis not present

## 2022-03-08 DIAGNOSIS — G43811 Other migraine, intractable, with status migrainosus: Secondary | ICD-10-CM | POA: Diagnosis not present

## 2022-03-08 DIAGNOSIS — I10 Essential (primary) hypertension: Secondary | ICD-10-CM | POA: Diagnosis not present

## 2022-03-08 DIAGNOSIS — R531 Weakness: Secondary | ICD-10-CM | POA: Diagnosis not present

## 2022-03-08 LAB — CBC
HCT: 41.5 % (ref 36.0–46.0)
Hemoglobin: 13.6 g/dL (ref 12.0–15.0)
MCH: 29 pg (ref 26.0–34.0)
MCHC: 32.8 g/dL (ref 30.0–36.0)
MCV: 88.5 fL (ref 80.0–100.0)
Platelets: 339 10*3/uL (ref 150–400)
RBC: 4.69 MIL/uL (ref 3.87–5.11)
RDW: 13.2 % (ref 11.5–15.5)
WBC: 13.7 10*3/uL — ABNORMAL HIGH (ref 4.0–10.5)
nRBC: 0 % (ref 0.0–0.2)

## 2022-03-08 LAB — BASIC METABOLIC PANEL
Anion gap: 12 (ref 5–15)
BUN: 19 mg/dL (ref 8–23)
CO2: 26 mmol/L (ref 22–32)
Calcium: 10.4 mg/dL — ABNORMAL HIGH (ref 8.9–10.3)
Chloride: 104 mmol/L (ref 98–111)
Creatinine, Ser: 0.89 mg/dL (ref 0.44–1.00)
GFR, Estimated: >60 mL/min (ref >60)
Glucose, Bld: 116 mg/dL — ABNORMAL HIGH (ref 70–99)
Potassium: 3.7 mmol/L (ref 3.5–5.1)
Sodium: 142 mmol/L (ref 135–145)

## 2022-03-08 LAB — HEPATIC FUNCTION PANEL
ALT: 15 U/L (ref 0–44)
AST: 23 U/L (ref 15–41)
Albumin: 4.8 g/dL (ref 3.5–5.0)
Alkaline Phosphatase: 71 U/L (ref 38–126)
Bilirubin, Direct: 0.1 mg/dL (ref 0.0–0.2)
Indirect Bilirubin: 1.3 mg/dL — ABNORMAL HIGH (ref 0.3–0.9)
Total Bilirubin: 1.4 mg/dL — ABNORMAL HIGH (ref 0.3–1.2)
Total Protein: 7.3 g/dL (ref 6.5–8.1)

## 2022-03-08 LAB — TSH: TSH: 2.743 u[IU]/mL (ref 0.350–4.500)

## 2022-03-08 LAB — SARS CORONAVIRUS 2 BY RT PCR: SARS Coronavirus 2 by RT PCR: NEGATIVE

## 2022-03-08 MED ORDER — LABETALOL HCL 5 MG/ML IV SOLN
10.0000 mg | Freq: Once | INTRAVENOUS | Status: AC
  Administered 2022-03-08: 10 mg via INTRAVENOUS
  Filled 2022-03-08: qty 4

## 2022-03-08 MED ORDER — SUMATRIPTAN SUCCINATE 6 MG/0.5ML ~~LOC~~ SOLN
6.0000 mg | Freq: Once | SUBCUTANEOUS | Status: AC
  Administered 2022-03-08: 6 mg via SUBCUTANEOUS
  Filled 2022-03-08: qty 0.5

## 2022-03-08 MED ORDER — MAGNESIUM SULFATE IN D5W 1-5 GM/100ML-% IV SOLN
1.0000 g | Freq: Once | INTRAVENOUS | Status: AC
  Administered 2022-03-08: 1 g via INTRAVENOUS
  Filled 2022-03-08: qty 100

## 2022-03-08 MED ORDER — KETOROLAC TROMETHAMINE 15 MG/ML IJ SOLN
15.0000 mg | Freq: Once | INTRAMUSCULAR | Status: AC
Start: 2022-03-08 — End: 2022-03-08
  Administered 2022-03-08: 15 mg via INTRAVENOUS
  Filled 2022-03-08: qty 1

## 2022-03-08 MED ORDER — HYDROMORPHONE HCL 1 MG/ML IJ SOLN
1.0000 mg | Freq: Once | INTRAMUSCULAR | Status: AC
  Administered 2022-03-08: 1 mg via INTRAVENOUS
  Filled 2022-03-08: qty 1

## 2022-03-08 MED ORDER — LACTATED RINGERS IV BOLUS
1000.0000 mL | Freq: Once | INTRAVENOUS | Status: AC
  Administered 2022-03-08: 1000 mL via INTRAVENOUS

## 2022-03-08 MED ORDER — METHYLPREDNISOLONE SODIUM SUCC 125 MG IJ SOLR
60.0000 mg | Freq: Once | INTRAMUSCULAR | Status: AC
  Administered 2022-03-08: 60 mg via INTRAVENOUS
  Filled 2022-03-08: qty 2

## 2022-03-08 MED ORDER — METOCLOPRAMIDE HCL 5 MG/ML IJ SOLN
10.0000 mg | Freq: Once | INTRAMUSCULAR | Status: AC
  Administered 2022-03-08: 10 mg via INTRAVENOUS
  Filled 2022-03-08: qty 2

## 2022-03-08 MED ORDER — ALPRAZOLAM 0.5 MG PO TABS
1.0000 mg | ORAL_TABLET | Freq: Once | ORAL | Status: AC
  Administered 2022-03-08: 1 mg via ORAL
  Filled 2022-03-08: qty 2

## 2022-03-08 NOTE — ED Provider Notes (Signed)
[facility] Provider Note    Event Date/Time   First MD Initiated Contact with Patient 03/08/22 1624     (approximate)   History   Migraine   HPI  Cacey Willow is a 61 y.o. female with pmh HTN, migraine HA who presents with HA.  Patient says headache started several days ago.  Was gradual in onset not maximal in onset.  Bitemporal and throbbing in quality.  She endorses photophobia and phonophobia.  Denies diplopia numbness tingling weakness.  Does have spots in her vision.  Patient takes Ambien and amitriptyline.  Has not had a migraine headache this severe in over 2 years.  Denies fevers chills recent illnesses.  Patient has had increasing weakness over the last month with 7 pound unintentional weight loss.  She had a positive Cologuard test today and is going to have a colonoscopy soon.  Has primary care appointment this week.     Past Medical History:  Diagnosis Date   Cancer ([place])    basal cell   Complication of anesthesia    nausea and vomiting   Headache    History of kidney stones    Hypertension    Kidney stones    PONV (postoperative nausea and vomiting)     Patient Active Problem List   Diagnosis Date Noted   GAD (generalized anxiety disorder) 02/02/2022   Depression 02/02/2022   Long-term current use of benzodiazepine 02/02/2022   Migraine with aura and without status migrainosus, not intractable 11/03/2020   Arrhythmia 08/06/2020   Status migrainosus 02/25/2019   Osteoporosis 04/09/2013   Vitamin D deficiency 04/09/2013   DDD (degenerative disc disease), lumbosacral 02/08/2005     Physical Exam  Triage Vital Signs: ED Triage Vitals [03/08/22 1611]  Enc Vitals Group     BP (!) 183/100     Pulse Rate 96     Resp 20     Temp 98.1 F (36.7 C)     Temp Source Oral     SpO2 98 %     Weight      Height      Head Circumference      Peak Flow      Pain Score      Pain Loc      Pain Edu?      Excl. in [place]?     Most recent  vital signs: Vitals:   03/08/22 2230 03/08/22 2231  BP: (!) 150/79 (!) 150/79  Pulse:  79  Resp:  18  Temp:    SpO2:  100%     General: Awake, no distress.  CV:  Good peripheral perfusion.  Resp:  Normal effort Abd:  No distention.  Neuro:             Awake, Alert, Oriented x 3  Other:  Patient sitting in dark room with glasses on Aox3, nml speech  PERRL, EOMI, face symmetric, nml tongue movement  5/5 strength in the BL upper and lower extremities  Sensation grossly intact in the BL upper and lower extremities  Finger-nose-finger intact BL    ED Results / Procedures / Treatments  Labs (all labs ordered are listed, but only abnormal results are displayed) Labs Reviewed  BASIC METABOLIC PANEL - Abnormal; Notable for the following components:      Result Value   Glucose, Bld 116 (*)    Calcium 10.4 (*)    All other components within normal limits  CBC - Abnormal; Notable for the  following components:   WBC 13.7 (*)    All other components within normal limits  HEPATIC FUNCTION PANEL - Abnormal; Notable for the following components:   Total Bilirubin 1.4 (*)    Indirect Bilirubin 1.3 (*)    All other components within normal limits  SARS CORONAVIRUS 2 BY RT PCR  TSH  URINALYSIS, ROUTINE W REFLEX MICROSCOPIC     EKG   RADIOLOGY I reviewed and interpreted the CT scan of the brain which does not show any acute intracranial process    PROCEDURES:  Critical Care performed: No  Procedures  The patient is on the cardiac monitor to evaluate for evidence of arrhythmia and/or significant heart rate changes.   MEDICATIONS ORDERED IN ED: Medications  ketorolac (TORADOL) 15 MG/ML injection 15 mg (15 mg Intravenous Given 03/08/22 1726)  lactated ringers bolus 1,000 mL (0 mLs Intravenous Stopped 03/08/22 2049)  metoCLOPramide (REGLAN) injection 10 mg (10 mg Intravenous Given 03/08/22 1726)  SUMAtriptan (IMITREX) injection 6 mg (6 mg Subcutaneous Given 03/08/22 1920)   magnesium sulfate IVPB 1 g 100 mL (0 g Intravenous Stopped 03/08/22 2021)  methylPREDNISolone sodium succinate (SOLU-MEDROL) 125 mg/2 mL injection 60 mg (60 mg Intravenous Given 03/08/22 1848)  HYDROmorphone (DILAUDID) injection 1 mg (1 mg Intravenous Given 03/08/22 2106)  labetalol (NORMODYNE) injection 10 mg (10 mg Intravenous Given 03/08/22 2106)  ALPRAZolam (XANAX) tablet 1 mg (1 mg Oral Given 03/08/22 2231)     IMPRESSION / MDM / ASSESSMENT AND PLAN / ED COURSE  I reviewed the triage vital signs and the nursing notes.                              Patient's presentation is most consistent with acute presentation with potential threat to life or bodily function.  Differential diagnosis includes, but is not limited to, migraine headache, tension headache, subarachnoid, cerebral venous sinus thrombosis  Patient is a 61 year old female with history of migraine headaches presents with several days of intractable migraine.  Headache is typical for prior migraine headaches was gradual in onset not maximal in onset.  It is associated with spots in the visual field without any neurologic symptoms otherwise.  Patient is hypertensive she says is typical when she is in pain.  Her neurologic exam is nonfocal.  She is sitting in a dark room with sunglasses on.  Does endorse a month of weakness and weight loss which she is following with her primary care provider about.  CBC and BMP are reassuring here.  No anemia, creatinine is normal.  Plan to treat as migraine headache with Toradol Reglan and fluid bolus.  CT head was obtained for triage is negative for acute abnormality.  Low suspicion for other life-threatening etiology such as subarachnoid or cervical venous sinus thrombosis given  After Toradol Reglan patient headache is improved but still uncomfortable.  We will give Imitrex Solu-Medrol magnesium.  On reassessment is sitting up in bed rocking back and forth.  Appears anxious.  Tells me that she is not  feeling any better.  Says she is just hit rock bottom.  When asked about given the nature of the headache she says it is actually been going on for several weeks and slowly increasing but today just hits peak.  She feels increasingly weak and shaky.  Has been having difficulty doing her normal activities because of how weak she feels.  At this point we will give a dose of IV  Dilaudid and obtain MRI of the brain.  Anticipate patient will likely need admission for intractable migraine.  Would add on some additional labs given her generalized weakness including TSH hepatic function panel urinalysis.  Patient's MRI brain is normal.  After Dilaudid pain is improved.  She is complaining of anxiety thinks this could be contributing.  Given 1 mg p.o. Xanax which is her home dose and she feels much improved and would like to go home.  Advised that she follow-up with primary care for her ongoing issues.  Clinical Course as of 03/08/22 2253  Tue Mar 08, 2022  1705 Hemoglobin: 13.6 [KM]  2047 GFR, Estimated: >60 [KM]    Clinical Course User Index [KM] Rada Hay, MD     FINAL CLINICAL IMPRESSION(S) / ED DIAGNOSES   Final diagnoses:  Other migraine with status migrainosus, intractable     Rx / DC Orders   ED Discharge Orders     None        Note:  This document was prepared using Dragon voice recognition software and may include unintentional dictation errors.   Rada Hay, MD 03/08/22 2253

## 2022-03-08 NOTE — ED Notes (Signed)
Assumed care, pt in NAD breathing e/u. Still c/o 10/10 headache, pressure and some blurred vision. States hx of migraines since childhood.

## 2022-03-08 NOTE — ED Triage Notes (Signed)
Pt arrives with c/o migraine that has been going on for a few days. Pt endorses nausea and blurry vision.

## 2022-03-10 ENCOUNTER — Ambulatory Visit: Payer: 59 | Admitting: Family

## 2022-03-14 ENCOUNTER — Ambulatory Visit: Payer: 59 | Admitting: Psychiatry

## 2022-03-14 ENCOUNTER — Encounter: Payer: Self-pay | Admitting: Psychiatry

## 2022-03-14 VITALS — BP 136/83 | HR 102 | Temp 98.5°F | Wt 123.6 lb

## 2022-03-14 DIAGNOSIS — F411 Generalized anxiety disorder: Secondary | ICD-10-CM

## 2022-03-14 DIAGNOSIS — Z79899 Other long term (current) drug therapy: Secondary | ICD-10-CM | POA: Diagnosis not present

## 2022-03-14 DIAGNOSIS — F32A Depression, unspecified: Secondary | ICD-10-CM | POA: Diagnosis not present

## 2022-03-14 MED ORDER — CLONAZEPAM 0.5 MG PO TABS: 0.5000 mg | ORAL_TABLET | Freq: Every day | ORAL | 1 refills | Status: DC | PRN

## 2022-03-14 MED ORDER — AMITRIPTYLINE HCL 50 MG PO TABS: 50.0000 mg | ORAL_TABLET | Freq: Every day | ORAL | 1 refills | Status: DC

## 2022-03-14 NOTE — Progress Notes (Unsigned)
[facility] OP Progress Note  03/14/2022 2:21 PM Ashley Collins  MRN:  [id number]  Chief Complaint:  Chief Complaint  Patient presents with   Follow-up: 61 year old Caucasian female with history of GAD, depression, presented for medication management with worsening headaches and worsening anxiety and sleep problems.   HPI: Ashley Collins is a 61 year old Caucasian female, currently on SSI, married, lives in [place], has a history of GAD, depression, migraine headaches, kidney stones, presented for medication management.  Patient with recent worsening of migraine headaches, was evaluated in the emergency department-03/08/2022-reviewed notes per emergency department physician-Dr.McHugh -patient was treated with Toradol, Reglan, Imitrex Solu-Medrol magnesium, IV Dilaudid.  MRI of brain was completed which was normal.  She was given 1 mg Xanax-home dose.'  Patient today reports she continues to struggle with her migraine headaches and continues to have symptoms of blurry vision and other concerns.  Patient reports her primary care provider recently readjusted her blood pressure medication.  Blood pressure was elevated initially once 68/98 in session.  On repeat it came down to 136/83.  Patient also had neurology evaluation recently-Dr. Karlyn Agee started on amitriptyline 25 mg for her headaches.  I have reviewed notes per Dr. Manuella Ghazi 03/11/2022-patient was given Toradol injection at her visit, amitriptyline was increased to 25 mg, started on sumatriptan 100 mg as needed, recommended magnesium, and discussed Botox injection.'  Patient reports she was able to taper herself off of the Celexa.  She did not tolerate the sertraline.  Reports she became very sick especially with worsening migraine headaches and hence had to stop taking the sertraline.  Patient reports she tried taking the hydroxyzine as needed for anxiety which does not seem to help.  Would like to go back on a benzodiazepine.  Patient currently denies  any suicidality, homicidality or perceptual disturbances.  Patient is not happy with her therapist, is planning to find a new therapist.  Motivated to stay in therapy.  Reports she is currently trying to get bioidentical hormone replacement-currently under a provider Ms.Coralee North.  She tried this previously and that helped her tremendously.  Hence interested in staying in this therapy.  Reports she would like to clarify something that she discussed with cardiologist previously about her history of cocaine use.  Reports she may have used cocaine just once maybe at the beginning of COVID-19 pandemic.  Patient reports it was just a one-time use, she passed out and does not remember much of what happened after that although she remembers that her family contacted 60.  Patient reports she currently does not use any illicit set substances and continues to stay clean.  Patient denies any other concerns today.  Visit Diagnosis:    ICD-10-CM   1. GAD (generalized anxiety disorder)  F41.1 amitriptyline (ELAVIL) 50 MG tablet    clonazePAM (KLONOPIN) 0.5 MG tablet    2. Depression, unspecified depression type  F32.A amitriptyline (ELAVIL) 50 MG tablet    3. High risk medication use  Z79.899       Past Psychiatric History: I have reviewed past psychiatric history from progress note on 02/02/2022.  Past trials of medications like Celexa, Xanax, Ambien  Past Medical History:  Past Medical History:  Diagnosis Date   Cancer ([place])    basal cell   Complication of anesthesia    nausea and vomiting   Headache    History of kidney stones    Hypertension    Kidney stones    PONV (postoperative nausea and vomiting)     Past  Surgical History:  Procedure Laterality Date   AUGMENTATION MAMMAPLASTY     CESAREAN SECTION     x 4   CYSTOSCOPY W/ RETROGRADES Bilateral 07/10/2020   Procedure: CYSTOSCOPY WITH RETROGRADE PYELOGRAM;  Surgeon: Billey Co, Collins;  Location: [facility];  Service: Urology;   Laterality: Bilateral;   CYSTOSCOPY/URETEROSCOPY/HOLMIUM LASER/STENT PLACEMENT     CYSTOSCOPY/URETEROSCOPY/HOLMIUM LASER/STENT PLACEMENT Bilateral 07/10/2020   Procedure: CYSTOSCOPY/URETEROSCOPY/HOLMIUM LASER/STENT PLACEMENT;  Surgeon: Billey Co, Collins;  Location: [facility];  Service: Urology;  Laterality: Bilateral;   EXTRACORPOREAL SHOCK WAVE LITHOTRIPSY     x 10 plus   Eye Lift     FACIAL COSMETIC SURGERY     GANGLION CYST EXCISION Right 12/21/2021   Procedure: REMOVAL GANGLION OF WRIST;  Surgeon: Hessie Knows, Collins;  Location: [facility];  Service: Orthopedics;  Laterality: Right;   LIPOSUCTION     PLACEMENT OF BREAST IMPLANTS     TONSILLECTOMY     TRIGGER FINGER RELEASE Right 12/21/2021   Procedure: RELEASE TRIGGER FINGER/A-1 PULLEY;  Surgeon: Hessie Knows, Collins;  Location: [facility];  Service: Orthopedics;  Laterality: Right;   URETEROSCOPY WITH HOLMIUM LASER LITHOTRIPSY      Family Psychiatric History: I have reviewed family psychiatric history from progress note on 02/02/2022.  Family History:  Family History  Problem Relation Age of Onset   Drug abuse Daughter    Depression Daughter    Depression Son    Bladder Cancer Neg Hx    Kidney cancer Neg Hx    Prostate cancer Neg Hx     Social History: I have reviewed social history from progress note on 02/02/2022. Social History   Socioeconomic History   Marital status: Married    Spouse name: james   Number of children: 4   Years of education: Not on file   Highest education level: High school graduate  Occupational History   Not on file  Tobacco Use   Smoking status: Never    Passive exposure: Never   Smokeless tobacco: Never  Vaping Use   Vaping Use: Never used  Substance and Sexual Activity   Alcohol use: Never   Drug use: Never   Sexual activity: Yes  Other Topics Concern   Not on file  Social History Narrative   Not on file   Social Determinants of Health   Financial Resource Strain: Not on file  Food  Insecurity: Not on file  Transportation Needs: Not on file  Physical Activity: Not on file  Stress: Not on file  Social Connections: Not on file    Allergies:  Allergies  Allergen Reactions   Percocet [Oxycodone-Acetaminophen] Itching    Metabolic Disorder Labs: No results found for: "HGBA1C", "MPG" No results found for: "PROLACTIN" No results found for: "CHOL", "TRIG", "HDL", "CHOLHDL", "VLDL", "LDLCALC" Lab Results  Component Value Date   TSH 2.743 03/08/2022    Therapeutic Level Labs: No results found for: "LITHIUM" No results found for: "VALPROATE" No results found for: "CBMZ"  Current Medications: Current Outpatient Medications  Medication Sig Dispense Refill   amitriptyline (ELAVIL) 50 MG tablet Take 1 tablet (50 mg total) by mouth at bedtime. 30 tablet 1   amLODipine (NORVASC) 5 MG tablet Take 2.5-5 mg by mouth as directed. Takes 7.5 mg daily     clonazePAM (KLONOPIN) 0.5 MG tablet Take 1 tablet (0.5 mg total) by mouth daily as needed for anxiety. 28 tablet 1   hydrOXYzine (ATARAX) 10 MG tablet Take 1-2 tablets (10-20 mg total) by mouth  2 (two) times daily as needed. For severe anxiety attacks only 60 tablet 1   indapamide (LOZOL) 2.5 MG tablet Take 2.5 mg by mouth daily.     losartan (COZAAR) 25 MG tablet Take 25 mg by mouth daily.     progesterone (PROMETRIUM) 200 MG capsule Take 200 mg by mouth at bedtime.     SUMAtriptan (IMITREX) 100 MG tablet Take by mouth.     zolpidem (AMBIEN) 10 MG tablet Take 1 tablet (10 mg total) by mouth at bedtime as needed for sleep. 30 tablet 2   No current facility-administered medications for this visit.     Musculoskeletal: Strength & Muscle Tone: within normal limits Gait & Station: normal Patient leans: N/A  Psychiatric Specialty Exam: Review of Systems  Neurological:  Positive for headaches.  Psychiatric/Behavioral:  Positive for decreased concentration, dysphoric mood and sleep disturbance. The patient is  nervous/anxious.   All other systems reviewed and are negative.   Blood pressure 136/83, pulse (!) 102, temperature 98.5 F (36.9 C), temperature source Temporal, weight 123 lb 9.6 oz (56.1 kg).Body mass index is 22.61 kg/m.  General Appearance: Casual  Eye Contact:  Fair  Speech:  Normal Rate  Volume:  Normal  Mood:  Anxious and Depressed  Affect:  Congruent  Thought Process:  Goal Directed and Descriptions of Associations: Intact  Orientation:  Full (Time, Place, and Person)  Thought Content: Logical   Suicidal Thoughts:  No  Homicidal Thoughts:  No  Memory:  Immediate;   Fair Recent;   Fair Remote;   Fair  Judgement:  Fair  Insight:  Fair  Psychomotor Activity:  Normal  Concentration:  Concentration: Fair and Attention Span: Fair  Recall:  [employer] of Knowledge: Fair  Language: Fair  Akathisia:  No  Handed:  Right  AIMS (if indicated): not done  Assets:  Communication Skills Desire for Improvement Housing Resilience Talents/Skills  ADL's:  Intact  Cognition: WNL  Sleep:  Poor due to migraine headaches   Screenings: GAD-7    Flowsheet Row Office Visit from 03/14/2022 in [place] Visit from 02/02/2022 in [place]  Total GAD-7 Score 20 18      PHQ2-9    [place] Visit from 02/02/2022 in [place]  PHQ-2 Total Score 3  PHQ-9 Total Score 13      [place] ED from 03/08/2022 in [place] Office Visit from 02/02/2022 in [place] Admission (Discharged) from 12/21/2021 in [place] No Risk No Risk No Risk        Assessment and Plan: Astria Jordahl is a 61 year old Caucasian female on SSI, married, lives in [place], has a history of anxiety, depression, migraine headache was evaluated in office today.  Patient  with worsening migraine headaches with recent emergency department visit, did not tolerate sertraline, continues to struggle with anxiety, depression, sleep problems complicated by her headaches, will benefit from the following plan.  Plan GAD-unstable Discontinue sertraline for side effects. Will not restart Celexa at this time. Increase amitriptyline to 50 mg p.o. nightly.  This was recently readjusted by neurologist. Discussed with patient amitriptyline also helps with her mood as well as sleep although it is also helpful with her headaches. Will start clonazepam 0.5 mg as needed for severe anxiety attacks only.  Patient advised to limit use.  Reviewed [place] PMP aware. Continue hydroxyzine 10 to 20  mg p.o. twice daily as needed for severe anxiety attacks Patient to continue CBT  Depression unspecified-rule out MDD-unstable Increase amitriptyline to 50 mg p.o. nightly Continue CBT  High risk medication use-reviewed urine drug screen with patient-02/08/2022-negative.   Long-term use of benzodiazepine-patient provided education about limiting the use of benzodiazepine as well as the risk of being on long-term benzodiazepine.  I have reviewed notes per emergency department visit-03/08/2022 as noted above, reviewed notes per Dr. Manuella Ghazi dated 03/11/2022 as noted above, will coordinate care, patient advised to sign an ROI.  Follow-up in clinic in 6 weeks or sooner if needed.   This note was generated in part or whole with voice recognition software. Voice recognition is usually quite accurate but there are transcription errors that can and very often do occur. I apologize for any typographical errors that were not detected and corrected.      Ursula Alert, Collins 03/14/2022, 2:21 PM

## 2022-03-14 NOTE — Patient Instructions (Signed)
Therapists [facility] - [phone]  Clonazepam Tablets What is this medication? CLONAZEPAM (kloe NA ze pam) treats seizures. It may also be used to treat panic disorder. It works by Child psychotherapist system calm down. It belongs to a group of medications called benzodiazepines. This medicine may be used for other purposes; ask your health care provider or pharmacist if you have questions. COMMON BRAND NAME(S): Ceberclon, Klonopin What should I tell my care team before I take this medication? They need to know if you have any of these conditions: An alcohol or drug abuse problem Bipolar disorder, depression, psychosis or other mental health condition Glaucoma Kidney or liver disease Lung or breathing disease Myasthenia gravis Parkinson disease Porphyria Seizures or a history of seizures Suicidal thoughts An unusual or allergic reaction to clonazepam, other benzodiazepines, foods, dyes, or preservatives Pregnant or trying to get pregnant Breast-feeding How should I use this medication? Take this medication by mouth with a glass of water. Follow the directions on the prescription label. If it upsets your stomach, take it with food or milk. Take your medication at regular intervals. Do not take it more often than directed. Do not stop taking or change the dose except on the advice of your care team. A special MedGuide will be given to you by the pharmacist with each prescription and refill. Be sure to read this information carefully each time. Talk to your care team regarding the use of this medication in children. Special care may be needed. Overdosage: If you think you have taken too much of this medicine contact a poison control center or emergency room at once. NOTE: This medicine is only for you. Do not share this medicine with others. What if I miss a dose? If you miss a dose, take it as soon as you can. If it is almost time for your next dose, take only that dose. Do not  take double or extra doses. What may interact with this medication? Do not take this medication with any of the following: Narcotic medications for cough Sodium oxybate This medication may also interact with the following: Alcohol Antihistamines for allergy, cough and cold Antiviral medications for HIV or AIDS Certain medications for anxiety or sleep Certain medications for depression, like amitriptyline, fluoxetine, sertraline Certain medications for fungal infections like ketoconazole and itraconazole Certain medications for seizures like carbamazepine, phenobarbital, phenytoin, primidone General anesthetics like halothane, isoflurane, methoxyflurane, propofol Local anesthetics like lidocaine, pramoxine, tetracaine Medications that relax muscles for surgery Narcotic medications for pain Phenothiazines like chlorpromazine, mesoridazine, prochlorperazine, thioridazine This list may not describe all possible interactions. Give your health care provider a list of all the medicines, herbs, non-prescription drugs, or dietary supplements you use. Also tell them if you smoke, drink alcohol, or use illegal drugs. Some items may interact with your medicine. What should I watch for while using this medication? Tell your care team if your symptoms do not start to get better or if they get worse. Do not stop taking except on your care team's advice. You may develop a severe reaction. Your care team will tell you how much medication to take. You may get drowsy or dizzy. Do not drive, use machinery, or do anything that needs mental alertness until you know how this medication affects you. To reduce the risk of dizzy and fainting spells, do not stand or sit up quickly, especially if you are an older patient. Alcohol may increase dizziness and drowsiness. Avoid alcoholic drinks. If you are taking another medication  that also causes drowsiness, you may have more side effects. Give your care team a list of all  medications you use. Your care team will tell you how much medication to take. Do not take more medication than directed. Call emergency services if you have problems breathing or unusual sleepiness. The use of this medication may increase the chance of suicidal thoughts or actions. Pay special attention to how you are responding while on this medication. Any worsening of mood, or thoughts of suicide or dying should be reported to your care team right away. What side effects may I notice from receiving this medication? Side effects that you should report to your care team as soon as possible: Allergic reactions--skin rash, itching, hives, swelling of the face, lips, tongue, or throat CNS depression--slow or shallow breathing, shortness of breath, feeling faint, dizziness, confusion, trouble staying awake Thoughts of suicide or self-harm, worsening mood, feelings of depression Side effects that usually do not require medical attention (report to your care team if they continue or are bothersome): Dizziness Drowsiness Headache This list may not describe all possible side effects. Call your doctor for medical advice about side effects. You may report side effects to FDA at 1-800-FDA-1088. Where should I keep my medication? Keep out of the reach of children and pets. This medication can be abused. Keep your medication in a safe place to protect it from theft. Do not share this medication with anyone. Selling or giving away this medication is dangerous and against the law. Store at room temperature between 15 and 30 degrees C (59 and 86 degrees F). Protect from light. Keep container tightly closed. This medication may cause accidental overdose and death if taken by other adults, children, or pets. Mix any unused medication with a substance like cat litter or coffee grounds. Then throw the medication away in a sealed container like a sealed bag or a coffee can with a lid. Do not use the medication after the  expiration date. NOTE: This sheet is a summary. It may not cover all possible information. If you have questions about this medicine, talk to your doctor, pharmacist, or health care provider.  2023 Elsevier/Gold Standard (2007-08-04 00:00:00)

## 2022-03-28 ENCOUNTER — Ambulatory Visit
Admission: RE | Admit: 2022-03-28 | Discharge: 2022-03-28 | Disposition: A | Discharge status: Home / Self Care | Payer: 59 | Source: Ambulatory Visit | Attending: Urology | Admitting: Urology

## 2022-03-28 DIAGNOSIS — N200 Calculus of kidney: Secondary | ICD-10-CM | POA: Diagnosis not present

## 2022-03-29 ENCOUNTER — Ambulatory Visit: Payer: 59 | Admitting: Urology

## 2022-03-29 ENCOUNTER — Other Ambulatory Visit: Payer: Self-pay

## 2022-03-29 ENCOUNTER — Encounter: Payer: Self-pay | Admitting: Urology

## 2022-03-29 ENCOUNTER — Other Ambulatory Visit: Payer: Self-pay | Admitting: *Deleted

## 2022-03-29 ENCOUNTER — Other Ambulatory Visit
Admission: RE | Admit: 2022-03-29 | Discharge: 2022-03-29 | Disposition: A | Discharge status: Home / Self Care | Payer: 59 | Attending: Urology | Admitting: Urology

## 2022-03-29 VITALS — BP 114/66 | HR 83 | Ht 62.0 in | Wt 124.0 lb

## 2022-03-29 DIAGNOSIS — N200 Calculus of kidney: Secondary | ICD-10-CM | POA: Diagnosis present

## 2022-03-29 DIAGNOSIS — Z87442 Personal history of urinary calculi: Secondary | ICD-10-CM

## 2022-03-29 DIAGNOSIS — R1030 Lower abdominal pain, unspecified: Secondary | ICD-10-CM

## 2022-03-29 DIAGNOSIS — R8289 Other abnormal findings on cytological and histological examination of urine: Secondary | ICD-10-CM

## 2022-03-29 LAB — URINALYSIS, COMPLETE (UACMP) WITH MICROSCOPIC
Glucose, UA: NEGATIVE mg/dL
Hgb urine dipstick: NEGATIVE
Ketones, ur: NEGATIVE mg/dL
Leukocytes,Ua: NEGATIVE
Nitrite: NEGATIVE
Specific Gravity, Urine: 1.015 (ref 1.005–1.030)
pH: 8.5 — ABNORMAL HIGH (ref 5.0–8.0)

## 2022-03-29 MED ORDER — TAMSULOSIN HCL 0.4 MG PO CAPS: 0.4000 mg | ORAL_CAPSULE | Freq: Every day | ORAL | 0 refills | Status: AC

## 2022-03-29 NOTE — Progress Notes (Signed)
   03/29/2022 12:51 PM   [address]man Circle 03/11/1961 [id number]  Reason for visit: Possible kidney stone  HPI: 61 year old female with recurrent calcium oxalate nephrolithiasis who has a long history of recurrent stone disease. She underwent bilateral ureteroscopy on 07/10/2020 for multiple impacted right ureteral stones that had been present at least 4 to 5 months, as well as small left distal ureteral stones and left renal stones.  A follow-up renal ultrasound in September 2022 showed borderline to minimal hydronephrosis on the right, but no other significant abnormalities, which is not surprising with her history of obstruction for 4 to 5 months prior to definitive treatment.  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  She presents today for 1 week of intermittent right-sided groin pain.  She is unsure if this could be related to a stone and wanted to get checked out.  She denies any fevers, chills, or UTI symptoms.  Urinalysis today contaminated with 11-20 squamous cells, 6-10 WBC, 11-20 RBC, many bacteria, nitrite negative, no leukocytes.  I personally viewed and interpreted the KUB today that shows no obvious evidence of stone disease.  We reviewed the images today.  I do not see any definitive kidney stones on the right side.  Recommend a trial of Flomax 0.4 mg daily with her microscopic hematuria for a possible small stone, and return precautions were discussed extensively.  With her history of chronic right-sided stones and mild hydronephrosis, would consider a CT in the future if persistent or worsening symptoms.  Flomax 0.4 mg nightly x2 weeks for possible small kidney stone RTC 4 weeks symptom check-consider CT if worsening symptoms     Billey Co, [facility] [address], [place], [place] [address] [phone]

## 2022-03-30 NOTE — H&P (Signed)
Pre-Procedure H&P   Patient ID: Ashley Collins is a 61 y.o. female.  Gastroenterology Provider: Annamaria Helling, DO  Referring Provider: Dawson Bills, NP PCP: Peggye Form, NP  Date: 03/31/2022  HPI Ms. Ashley Collins is a 61 y.o. female who presents today for Colonoscopy for positive Cologuard.  Patient had positive Cologuard testing in May 2023.  Has noted some change in form- more "pasty."  Bowel movements are moving regularly without melena hematochezia diarrhea or constipation.  She has no family history of colon cancer or colon polyps.  Weight loss of approximately 15lbs in the last month. Had night sweats with hormone treatment.  B12 273 hemoglobin 13.6 MCV 88.5 platelets 339,000 total bili 1.4 indirect predominant at 1.3.  AST 23 ALT 15 creatinine 0.9 alk phos 71  Last colonoscopy at age 6 in [place] which was normal per the patient History of cocaine use   Past Medical History:  Diagnosis Date   Cancer ([place])    basal cell   Complication of anesthesia    nausea and vomiting   Headache    History of kidney stones    Hypertension    Kidney stones    PONV (postoperative nausea and vomiting)     Past Surgical History:  Procedure Laterality Date   AUGMENTATION MAMMAPLASTY     CESAREAN SECTION     x 4   CYSTOSCOPY W/ RETROGRADES Bilateral 07/10/2020   Procedure: CYSTOSCOPY WITH RETROGRADE PYELOGRAM;  Surgeon: Billey Co, MD;  Location: [facility] ORS;  Service: Urology;  Laterality: Bilateral;   CYSTOSCOPY/URETEROSCOPY/HOLMIUM LASER/STENT PLACEMENT     CYSTOSCOPY/URETEROSCOPY/HOLMIUM LASER/STENT PLACEMENT Bilateral 07/10/2020   Procedure: CYSTOSCOPY/URETEROSCOPY/HOLMIUM LASER/STENT PLACEMENT;  Surgeon: Billey Co, MD;  Location: [facility] OR[facility];  Service: Urology;  Laterality: Bilateral;   EXTRACORPOREAL SHOCK WAVE LITHOTRIPSY     x 10 plus   Eye Lift     FACIAL COSMETIC SURGERY     GANGLION CYST EXCISION Right 12/21/2021   Procedure: REMOVAL GANGLION OF WRIST;   Surgeon: Hessie Knows, MD;  Location: [facility] ORS;  Service: Orthopedics;  Laterality: Right;   LIPOSUCTION     PLACEMENT OF BREAST IMPLANTS     TONSILLECTOMY     TRIGGER FINGER RELEASE Right 12/21/2021   Procedure: RELEASE TRIGGER FINGER/A-1 PULLEY;  Surgeon: Hessie Knows, MD;  Location: [facility] OR[facility];  Service: Orthopedics;  Laterality: Right;   URETEROSCOPY WITH HOLMIUM LASER LITHOTRIPSY      Family History No h/o GI disease or malignancy  Review of Systems  Constitutional:  Negative for activity change, appetite change, chills, diaphoresis, fatigue, fever and unexpected weight change.  HENT:  Negative for trouble swallowing and voice change.   Respiratory:  Negative for shortness of breath and wheezing.   Cardiovascular:  Negative for chest pain, palpitations and leg swelling.  Gastrointestinal:  Positive for abdominal pain. Negative for abdominal distention, anal bleeding, blood in stool, constipation, diarrhea, nausea, rectal pain and vomiting.  Musculoskeletal:  Negative for arthralgias and myalgias.  Skin:  Negative for color change and pallor.  Neurological:  Negative for dizziness, syncope and weakness.  Psychiatric/Behavioral:  Negative for confusion.   All other systems reviewed and are negative.    Medications No current facility-administered medications on file prior to encounter.   Current Outpatient Medications on File Prior to Encounter  Medication Sig Dispense Refill   amLODipine (NORVASC) 5 MG tablet Take 2.5-5 mg by mouth as directed. Takes 7.5 mg daily     hydrOXYzine (ATARAX) 10 MG tablet Take  1-2 tablets (10-20 mg total) by mouth 2 (two) times daily as needed. For severe anxiety attacks only 60 tablet 1   zolpidem (AMBIEN) 10 MG tablet Take 1 tablet (10 mg total) by mouth at bedtime as needed for sleep. 30 tablet 2   indapamide (LOZOL) 2.5 MG tablet Take 2.5 mg by mouth daily.      Pertinent medications related to GI and procedure were reviewed by me with the  patient prior to the procedure   Current Facility-Administered Medications:    0.9 %  sodium chloride infusion, , Intravenous, Continuous, Annamaria Helling, DO, Last Rate: 20 mL/hr at 03/31/22 1003, Continued from Pre-op at 03/31/22 1003      Allergies  Allergen Reactions   Percocet [Oxycodone-Acetaminophen] Itching   Allergies were reviewed by me prior to the procedure  Objective   Body mass index is 21.95 kg/m. Vitals:   03/31/22 0931  BP: 139/86  Pulse: 89  Resp: 16  Temp: (!) 96 F (35.6 C)  TempSrc: Temporal  Weight: 54.4 kg  Height: $Remove'5\' 2"'[id number]$  (1.575 m)     Physical Exam Vitals and nursing note reviewed.  Constitutional:      General: She is not in acute distress.    Appearance: Normal appearance. She is not ill-appearing, toxic-appearing or diaphoretic.  HENT:     Head: Normocephalic and atraumatic.     Nose: Nose normal.     Mouth/Throat:     Mouth: Mucous membranes are moist.     Pharynx: Oropharynx is clear.  Eyes:     General: No scleral icterus.    Extraocular Movements: Extraocular movements intact.  Cardiovascular:     Rate and Rhythm: Normal rate and regular rhythm.     Heart sounds: Normal heart sounds. No murmur heard.    No friction rub. No gallop.  Pulmonary:     Effort: Pulmonary effort is normal. No respiratory distress.     Breath sounds: Normal breath sounds. No wheezing, rhonchi or rales.  Abdominal:     General: Bowel sounds are normal. There is no distension.     Palpations: Abdomen is soft.     Tenderness: There is no abdominal tenderness. There is no guarding or rebound.  Musculoskeletal:     Cervical back: Neck supple.     Right lower leg: No edema.     Left lower leg: No edema.  Skin:    General: Skin is warm and dry.     Coloration: Skin is not jaundiced or pale.  Neurological:     General: No focal deficit present.     Mental Status: She is alert and oriented to person, place, and time. Mental status is at baseline.   Psychiatric:        Mood and Affect: Mood normal.        Behavior: Behavior normal.        Thought Content: Thought content normal.        Judgment: Judgment normal.      Assessment:  Ms. Ashley Collins is a 61 y.o. female  who presents today for Colonoscopy for Positive Cologuard.  Plan:  Colonoscopy with possible intervention today  Colonoscopy with possible biopsy, control of bleeding, polypectomy, and interventions as necessary has been discussed with the patient/patient representative. Informed consent was obtained from the patient/patient representative after explaining the indication, nature, and risks of the procedure including but not limited to death, bleeding, perforation, missed neoplasm/lesions, cardiorespiratory compromise, and reaction to medications. Opportunity for questions was  given and appropriate answers were provided. Patient/patient representative has verbalized understanding is amenable to undergoing the procedure.   Annamaria Helling, DO  [facility]  Portions of the record may have been created with voice recognition software. Occasional wrong-word or 'sound-a-like' substitutions may have occurred due to the inherent limitations of voice recognition software.  Read the chart carefully and recognize, using context, where substitutions may have occurred.

## 2022-03-31 ENCOUNTER — Ambulatory Visit: Payer: 59 | Admitting: Certified Registered Nurse Anesthetist

## 2022-03-31 ENCOUNTER — Other Ambulatory Visit: Payer: Self-pay | Admitting: Psychiatry

## 2022-03-31 ENCOUNTER — Encounter
Admission: RE | Disposition: A | Discharge status: Home / Self Care | Payer: Self-pay | Source: Home / Self Care | Attending: Gastroenterology

## 2022-03-31 ENCOUNTER — Ambulatory Visit
Admission: RE | Admit: 2022-03-31 | Discharge: 2022-03-31 | Disposition: A | Discharge status: Home / Self Care | Payer: 59 | Attending: Gastroenterology | Admitting: Gastroenterology

## 2022-03-31 ENCOUNTER — Encounter: Payer: Self-pay | Admitting: Gastroenterology

## 2022-03-31 DIAGNOSIS — K5730 Diverticulosis of large intestine without perforation or abscess without bleeding: Secondary | ICD-10-CM | POA: Diagnosis not present

## 2022-03-31 DIAGNOSIS — K640 First degree hemorrhoids: Secondary | ICD-10-CM | POA: Diagnosis not present

## 2022-03-31 DIAGNOSIS — Z1211 Encounter for screening for malignant neoplasm of colon: Secondary | ICD-10-CM | POA: Insufficient documentation

## 2022-03-31 DIAGNOSIS — D120 Benign neoplasm of cecum: Secondary | ICD-10-CM | POA: Diagnosis not present

## 2022-03-31 DIAGNOSIS — K621 Rectal polyp: Secondary | ICD-10-CM | POA: Diagnosis not present

## 2022-03-31 DIAGNOSIS — R195 Other fecal abnormalities: Secondary | ICD-10-CM | POA: Diagnosis not present

## 2022-03-31 DIAGNOSIS — F411 Generalized anxiety disorder: Secondary | ICD-10-CM

## 2022-03-31 DIAGNOSIS — K635 Polyp of colon: Secondary | ICD-10-CM | POA: Diagnosis not present

## 2022-03-31 DIAGNOSIS — F418 Other specified anxiety disorders: Secondary | ICD-10-CM | POA: Insufficient documentation

## 2022-03-31 DIAGNOSIS — F32A Depression, unspecified: Secondary | ICD-10-CM

## 2022-03-31 HISTORY — PX: COLONOSCOPY WITH PROPOFOL: SHX5780

## 2022-03-31 SURGERY — COLONOSCOPY WITH PROPOFOL
Anesthesia: General

## 2022-03-31 MED ORDER — LIDOCAINE HCL (CARDIAC) PF 100 MG/5ML IV SOSY
PREFILLED_SYRINGE | INTRAVENOUS | Status: DC | PRN
  Administered 2022-03-31: 50 mg via INTRAVENOUS

## 2022-03-31 MED ORDER — SODIUM CHLORIDE 0.9 % IV SOLN: INTRAVENOUS | Status: DC

## 2022-03-31 MED ORDER — PROPOFOL 500 MG/50ML IV EMUL
INTRAVENOUS | Status: DC | PRN
  Administered 2022-03-31: 140 ug/kg/min via INTRAVENOUS

## 2022-03-31 MED ORDER — PROPOFOL 10 MG/ML IV BOLUS
INTRAVENOUS | Status: DC | PRN
  Administered 2022-03-31: 60 mg via INTRAVENOUS
  Administered 2022-03-31 (×2): 20 mg via INTRAVENOUS

## 2022-03-31 MED ORDER — PROPOFOL 10 MG/ML IV BOLUS
INTRAVENOUS | Status: AC
  Filled 2022-03-31: qty 20

## 2022-03-31 NOTE — Interval H&P Note (Signed)
History and Physical Interval Note: Preprocedure H&P from 03/31/22  was reviewed and there was no interval change after seeing and examining the patient.  Written consent was obtained from the patient after discussion of risks, benefits, and alternatives. Patient has consented to proceed with Colonoscopy with possible intervention   03/31/2022 10:15 AM  Ashley Collins  has presented today for surgery, with the diagnosis of R19.5  - Positive colorectal cancer screening using Cologuard test.  The various methods of treatment have been discussed with the patient and family. After consideration of risks, benefits and other options for treatment, the patient has consented to  Procedure(s): COLONOSCOPY WITH PROPOFOL (N/A) as a surgical intervention.  The patient's history has been reviewed, patient examined, no change in status, stable for surgery.  I have reviewed the patient's chart and labs.  Questions were answered to the patient's satisfaction.     Annamaria Helling

## 2022-03-31 NOTE — Anesthesia Postprocedure Evaluation (Signed)
Anesthesia Post Note  Patient: Ashley Collins  Procedure(s) Performed: COLONOSCOPY WITH PROPOFOL  Patient location during evaluation: PACU Anesthesia Type: General Level of consciousness: awake and oriented Pain management: satisfactory to patient Vital Signs Assessment: post-procedure vital signs reviewed and stable Respiratory status: nonlabored ventilation and respiratory function stable Cardiovascular status: stable Anesthetic complications: no   No notable events documented.   Last Vitals:  Vitals:   03/31/22 1108 03/31/22 1114  BP: 112/66 135/82  Pulse: 84 87  Resp: 14 13  Temp:    SpO2: 94% 97%    Last Pain:  Vitals:   03/31/22 1114  TempSrc:   PainSc: 0-No pain                 VAN STAVEREN,[name]

## 2022-03-31 NOTE — Transfer of Care (Signed)
Immediate Anesthesia Transfer of Care Note  Patient: Pearla Mckinny  Procedure(s) Performed: COLONOSCOPY WITH PROPOFOL  Patient Location: Endoscopy Unit  Anesthesia Type:General  Level of Consciousness: drowsy  Airway & Oxygen Therapy: Patient Spontanous Breathing  Post-op Assessment: Report given to RN and Post -op Vital signs reviewed and stable  Post vital signs: Reviewed and stable  Last Vitals:  Vitals Value Taken Time  BP 93/48 03/31/22 1055  Temp    Pulse 76 03/31/22 1055  Resp 13 03/31/22 1055  SpO2 100 % 03/31/22 1055  Vitals shown include unvalidated device data.  Last Pain:  Vitals:   03/31/22 0931  TempSrc: Temporal         Complications: No notable events documented.

## 2022-03-31 NOTE — Op Note (Addendum)
[facility]astroenterology Patient Name: Ashley Collins Procedure Date: 03/31/2022 9:33 AM MRN: [id number] Account #: [id number] Date of Birth: 06/24/1961 Admit Type: Outpatient Age: 61 Room: [facility] ENDO ROOM 1 Gender: Female Note Status: Finalized Instrument Name: Colonoscope [id number] Procedure:             Colonoscopy Indications:           Positive Cologuard test Providers:             Annamaria Helling DO, DO Referring MD:          Colan Neptune. Fields (Referring MD) Medicines:             Monitored Anesthesia Care Complications:         No immediate complications. Estimated blood loss:                         Minimal. Procedure:             Pre-Anesthesia Assessment:                        - Prior to the procedure, a History and Physical was                         performed, and patient medications and allergies were                         reviewed. The patient is competent. The risks and                         benefits of the procedure and the sedation options and                         risks were discussed with the patient. All questions                         were answered and informed consent was obtained.                         Patient identification and proposed procedure were                         verified by the physician, the nurse, the anesthetist                         and the technician in the endoscopy suite. Mental                         Status Examination: alert and oriented. Airway                         Examination: normal oropharyngeal airway and neck                         mobility. Respiratory Examination: clear to                         auscultation. CV Examination: RRR, no murmurs, no S3  or S4. Prophylactic Antibiotics: The patient does not                         require prophylactic antibiotics. Prior                         Anticoagulants: The patient has taken no previous                          anticoagulant or antiplatelet agents. ASA Grade                         Assessment: II - A patient with mild systemic disease.                         After reviewing the risks and benefits, the patient                         was deemed in satisfactory condition to undergo the                         procedure. The anesthesia plan was to use monitored                         anesthesia care (MAC). Immediately prior to                         administration of medications, the patient was                         re-assessed for adequacy to receive sedatives. The                         heart rate, respiratory rate, oxygen saturations,                         blood pressure, adequacy of pulmonary ventilation, and                         response to care were monitored throughout the                         procedure. The physical status of the patient was                         re-assessed after the procedure.                        After obtaining informed consent, the colonoscope was                         passed under direct vision. Throughout the procedure,                         the patient's blood pressure, pulse, and oxygen                         saturations were monitored continuously. The  Colonoscope was introduced through the anus and                         advanced to the the terminal ileum, with                         identification of the appendiceal orifice and IC                         valve. The colonoscopy was performed without                         difficulty. The patient tolerated the procedure well.                         The quality of the bowel preparation was evaluated                         using the BBPS [facility] Bowel Preparation Scale) with                         scores of: Right Colon = 3, Transverse Colon = 3 and                         Left Colon = 3 (entire mucosa seen well with no                         residual staining,  small fragments of stool or opaque                         liquid). The total BBPS score equals 9. The ileocecal                         valve, appendiceal orifice, and rectum were                         photographed. Findings:      The perianal and digital rectal examinations were normal. Pertinent       negatives include normal sphincter tone.      The terminal ileum appeared normal.      A few small-mouthed diverticula were found in the sigmoid colon.       Estimated blood loss: none.      Non-bleeding internal hemorrhoids were found during retroflexion. The       hemorrhoids were Grade I (internal hemorrhoids that do not prolapse).       Estimated blood loss: none.      Two sessile polyps were found in the rectum and cecum. The polyps were 3       to 4 mm in size. These polyps were removed with a cold snare. Resection       and retrieval were complete. Estimated blood loss was minimal.      Three sessile polyps were found in the rectum, descending colon and       ascending colon. The polyps were 1 to 2 mm in size. These polyps were       removed with a jumbo cold forceps. Resection and retrieval were       complete. Estimated blood loss  was minimal.      Retroflexion in the right colon was performed.      The exam was otherwise without abnormality on direct and retroflexion       views. Impression:            - The examined portion of the ileum was normal.                        - Diverticulosis in the sigmoid colon.                        - Non-bleeding internal hemorrhoids.                        - Two 3 to 4 mm polyps in the rectum and in the cecum,                         removed with a cold snare. Resected and retrieved.                        - Three 1 to 2 mm polyps in the rectum, in the                         descending colon and in the ascending colon, removed                         with a jumbo cold forceps. Resected and retrieved.                        - The  examination was otherwise normal on direct and                         retroflexion views. Recommendation:        - Patient has a contact number available for                         emergencies. The signs and symptoms of potential                         delayed complications were discussed with the patient.                         Return to normal activities tomorrow. Written                         discharge instructions were provided to the patient.                        - Discharge patient to home.                        - Resume previous diet.                        - Continue present medications.                        - No aspirin, ibuprofen, naproxen, or other  non-steroidal anti-inflammatory drugs for 5 days after                         polyp removal.                        - Await pathology results.                        - Repeat colonoscopy for surveillance based on                         pathology results.                        - Return to referring physician as previously                         scheduled.                        - The findings and recommendations were discussed with                         the patient. Procedure Code(s):     --- Professional ---                        [phone], Colonoscopy, flexible; with removal of                         tumor(s), polyp(s), or other lesion(s) by snare                         technique                        45380, 68, Colonoscopy, flexible; with biopsy, single                         or multiple Diagnosis Code(s):     --- Professional ---                        K64.0, First degree hemorrhoids                        K62.1, Rectal polyp                        K63.5, Polyp of colon                        R19.5, Other fecal abnormalities                        K57.30, Diverticulosis of large intestine without                         perforation or abscess without bleeding CPT copyright 2019  American Medical Association. All rights reserved. The codes documented in this report are preliminary and upon coder review may  be revised to meet current compliance requirements. Attending Participation:      I personally performed the entire procedure. Volney American, DO Remo Lipps  Bernita Raisin DO, DO 03/31/2022 11:00:57 AM This report has been signed electronically. Number of Addenda: 0 Note Initiated On: 03/31/2022 9:33 AM Estimated Blood Loss:  Estimated blood loss was minimal.      [facility]

## 2022-03-31 NOTE — Anesthesia Preprocedure Evaluation (Signed)
Anesthesia Evaluation  Patient identified by MRN, date of birth, ID band Patient awake    Reviewed: Allergy & Precautions, NPO status , Patient's Chart, lab work & pertinent test results  Airway Mallampati: II  TM Distance: >3 FB Neck ROM: Full    Dental  (+) Teeth Intact   Pulmonary neg pulmonary ROS,    Pulmonary exam normal breath sounds clear to auscultation       Cardiovascular Exercise Tolerance: Good hypertension, Pt. on medications negative cardio ROS Normal cardiovascular exam Rhythm:Regular Rate:Normal     Neuro/Psych Anxiety Depression negative neurological ROS  negative psych ROS   GI/Hepatic negative GI ROS, Neg liver ROS,   Endo/Other  negative endocrine ROS  Renal/GU negative Renal ROS  negative genitourinary   Musculoskeletal  (+) Arthritis ,   Abdominal Normal abdominal exam  (+)   Peds negative pediatric ROS (+)  Hematology negative hematology ROS (+)   Anesthesia Other Findings Past Medical History: No date: Cancer ([place])     Comment:  basal cell No date: Complication of anesthesia     Comment:  nausea and vomiting No date: Headache No date: History of kidney stones No date: Hypertension No date: Kidney stones No date: PONV (postoperative nausea and vomiting)  Past Surgical History: No date: AUGMENTATION MAMMAPLASTY No date: CESAREAN SECTION     Comment:  x 4 07/10/2020: CYSTOSCOPY W/ RETROGRADES; Bilateral     Comment:  Procedure: CYSTOSCOPY WITH RETROGRADE PYELOGRAM;                Surgeon: Billey Co, MD;  Location: [facility] ORS;                Service: Urology;  Laterality: Bilateral; No date: CYSTOSCOPY/URETEROSCOPY/HOLMIUM LASER/STENT PLACEMENT 07/10/2020: CYSTOSCOPY/URETEROSCOPY/HOLMIUM LASER/STENT PLACEMENT;  Bilateral     Comment:  Procedure: CYSTOSCOPY/URETEROSCOPY/HOLMIUM LASER/STENT               PLACEMENT;  Surgeon: Billey Co, MD;  Location:               [facility]  ORS;  Service: Urology;  Laterality: Bilateral; No date: EXTRACORPOREAL SHOCK WAVE LITHOTRIPSY     Comment:  x 10 plus No date: Eye Lift No date: FACIAL COSMETIC SURGERY 12/21/2021: GANGLION CYST EXCISION; Right     Comment:  Procedure: REMOVAL GANGLION OF WRIST;  Surgeon: Hessie Knows, MD;  Location: [facility] ORS;  Service: Orthopedics;               Laterality: Right; No date: LIPOSUCTION No date: PLACEMENT OF BREAST IMPLANTS No date: TONSILLECTOMY 12/21/2021: TRIGGER FINGER RELEASE; Right     Comment:  Procedure: RELEASE TRIGGER FINGER/A-1 PULLEY;  Surgeon:               Hessie Knows, MD;  Location: [facility] ORS;  Service:               Orthopedics;  Laterality: Right; No date: URETEROSCOPY WITH HOLMIUM LASER LITHOTRIPSY  BMI    Body Mass Index: 21.95 kg/m      Reproductive/Obstetrics negative OB ROS                             Anesthesia Physical Anesthesia Plan  ASA: 2  Anesthesia Plan: General   Post-op Pain Management:    Induction: Intravenous  PONV Risk Score and Plan: Propofol infusion and TIVA  Airway  Management Planned: Natural Airway  Additional Equipment:   Intra-op Plan:   Post-operative Plan: Extubation in OR  Informed Consent: I have reviewed the patients History and Physical, chart, labs and discussed the procedure including the risks, benefits and alternatives for the proposed anesthesia with the patient or authorized representative who has indicated his/her understanding and acceptance.     Dental Advisory Given  Plan Discussed with: CRNA and Surgeon  Anesthesia Plan Comments:         Anesthesia Quick Evaluation

## 2022-03-31 NOTE — Anesthesia Procedure Notes (Signed)
Date/Time: 03/31/2022 10:19 AM  Performed by: Lily Peer, Aaminah Forrester, CRNAPre-anesthesia Checklist: Patient identified, Emergency Drugs available, Suction available, Patient being monitored and Timeout performed Patient Re-evaluated:Patient Re-evaluated prior to induction Oxygen Delivery Method: Nasal cannula Induction Type: IV induction

## 2022-04-01 ENCOUNTER — Encounter: Payer: Self-pay | Admitting: Gastroenterology

## 2022-04-01 LAB — SURGICAL PATHOLOGY

## 2022-04-09 ENCOUNTER — Other Ambulatory Visit: Payer: Self-pay | Admitting: Urology

## 2022-04-09 DIAGNOSIS — N200 Calculus of kidney: Secondary | ICD-10-CM

## 2022-04-11 ENCOUNTER — Telehealth: Payer: Self-pay | Admitting: *Deleted

## 2022-04-11 NOTE — Telephone Encounter (Signed)
Called pt to schedule 4 week symptom check and pt declined, pt states she is feeling better.

## 2022-04-25 ENCOUNTER — Encounter: Payer: Self-pay | Admitting: Psychiatry

## 2022-04-25 ENCOUNTER — Ambulatory Visit: Payer: 59 | Admitting: Psychiatry

## 2022-04-25 VITALS — BP 129/79 | HR 90 | Temp 99.1°F | Ht 62.0 in | Wt 127.8 lb

## 2022-04-25 DIAGNOSIS — G4701 Insomnia due to medical condition: Secondary | ICD-10-CM | POA: Diagnosis not present

## 2022-04-25 DIAGNOSIS — F3289 Other specified depressive episodes: Secondary | ICD-10-CM

## 2022-04-25 DIAGNOSIS — Z79899 Other long term (current) drug therapy: Secondary | ICD-10-CM

## 2022-04-25 DIAGNOSIS — F411 Generalized anxiety disorder: Secondary | ICD-10-CM

## 2022-04-25 MED ORDER — AMITRIPTYLINE HCL 50 MG PO TABS: 50.0000 mg | ORAL_TABLET | Freq: Every day | ORAL | 2 refills | Status: DC

## 2022-04-25 MED ORDER — ZOLPIDEM TARTRATE 10 MG PO TABS: 10.0000 mg | ORAL_TABLET | Freq: Every evening | ORAL | 2 refills | Status: DC | PRN

## 2022-04-25 MED ORDER — CLONAZEPAM 0.5 MG PO TABS: 0.5000 mg | ORAL_TABLET | Freq: Every day | ORAL | 2 refills | Status: DC | PRN

## 2022-04-25 NOTE — Progress Notes (Unsigned)
[facility] MD OP Progress Note  04/25/2022 3:00 PM Ashley Collins  MRN:  [id number]  Chief Complaint:  Chief Complaint  Patient presents with   Follow-up   Depression   Anxiety   HPI: Ashley Collins is a 61 year old Caucasian female, currently on SSI, married, lives in [place], has a history of GAD, depression, migraine headaches, kidney stones, presented for medication management.  Patient today reports she is currently planning to start a job.  She reports she interviewed several places and was offered 2 jobs, one at [employer] and the other one at [place].  Patient reports this will be a good change for her since she will be able to get out of the house and also she will be able to help herself financially.  Patient reports overall since being on the higher dosage of amitriptyline she has been doing well with regards to her mood.  Anxiety symptoms have improved although she continues to have anxiety about being the caregiver for her spouse who has dementia.  She reports she however is in a dementia support group and that has been beneficial.  Continues to make sure he has follow-ups with his neurologist.  Patient also reports she has been able to follow-up with her neurologist and her migraine headaches are currently better.  She is looking forward to getting Botox injections soon.  Patient denies any suicidality, homicidality or perceptual disturbances.  Patient reports she has been trying to limit the amount of clonazepam, currently uses it a couple of times a week only.  Patient denies any other concerns today.  Visit Diagnosis:    ICD-10-CM   1. GAD (generalized anxiety disorder)  F41.1 amitriptyline (ELAVIL) 50 MG tablet    clonazePAM (KLONOPIN) 0.5 MG tablet    2. Other specified depressive episodes  F32.89 amitriptyline (ELAVIL) 50 MG tablet   Depressive episodes with insufficinet symptoms    3. Insomnia due to medical condition  G47.01 zolpidem (AMBIEN) 10 MG tablet   mood,  headaches    4. Long-term current use of benzodiazepine  Z79.899       Past Psychiatric History: I have reviewed past psychiatric history from progress note on 02/02/2022.  Past trials of medications like Celexa, Xanax, Ambien.  Past Medical History:  Past Medical History:  Diagnosis Date   Cancer ([place])    basal cell   Complication of anesthesia    nausea and vomiting   Headache    History of kidney stones    Hypertension    Kidney stones    PONV (postoperative nausea and vomiting)     Past Surgical History:  Procedure Laterality Date   AUGMENTATION MAMMAPLASTY     CESAREAN SECTION     x 4   COLONOSCOPY WITH PROPOFOL N/A 03/31/2022   Procedure: COLONOSCOPY WITH PROPOFOL;  Surgeon: Annamaria Helling, DO;  Location: [facility] ENDOSCOPY;  Service: Gastroenterology;  Laterality: N/A;   CYSTOSCOPY W/ RETROGRADES Bilateral 07/10/2020   Procedure: CYSTOSCOPY WITH RETROGRADE PYELOGRAM;  Surgeon: Billey Co, MD;  Location: [facility] ORS;  Service: Urology;  Laterality: Bilateral;   CYSTOSCOPY/URETEROSCOPY/HOLMIUM LASER/STENT PLACEMENT     CYSTOSCOPY/URETEROSCOPY/HOLMIUM LASER/STENT PLACEMENT Bilateral 07/10/2020   Procedure: CYSTOSCOPY/URETEROSCOPY/HOLMIUM LASER/STENT PLACEMENT;  Surgeon: Billey Co, MD;  Location: [facility] ORS;  Service: Urology;  Laterality: Bilateral;   EXTRACORPOREAL SHOCK WAVE LITHOTRIPSY     x 10 plus   Eye Lift     FACIAL COSMETIC SURGERY     GANGLION CYST EXCISION Right 12/21/2021   Procedure: REMOVAL GANGLION  OF WRIST;  Surgeon: Hessie Knows, MD;  Location: [facility] ORS;  Service: Orthopedics;  Laterality: Right;   LIPOSUCTION     PLACEMENT OF BREAST IMPLANTS     TONSILLECTOMY     TRIGGER FINGER RELEASE Right 12/21/2021   Procedure: RELEASE TRIGGER FINGER/A-1 PULLEY;  Surgeon: Hessie Knows, MD;  Location: [facility] ORS;  Service: Orthopedics;  Laterality: Right;   URETEROSCOPY WITH HOLMIUM LASER LITHOTRIPSY      Family Psychiatric History: Reviewed family  psychiatric history from progress note on 02/02/2022.  Family History:  Family History  Problem Relation Age of Onset   Drug abuse Daughter    Depression Daughter    Depression Son    Bladder Cancer Neg Hx    Kidney cancer Neg Hx    Prostate cancer Neg Hx     Social History: Reviewed social history from progress note on 02/02/2022. Social History   Socioeconomic History   Marital status: Married    Spouse name: james   Number of children: 4   Years of education: Not on file   Highest education level: High school graduate  Occupational History   Not on file  Tobacco Use   Smoking status: Never    Passive exposure: Never   Smokeless tobacco: Never  Vaping Use   Vaping Use: Never used  Substance and Sexual Activity   Alcohol use: Never   Drug use: Never   Sexual activity: Yes  Other Topics Concern   Not on file  Social History Narrative   Not on file   Social Determinants of Health   Financial Resource Strain: Not on file  Food Insecurity: Not on file  Transportation Needs: Not on file  Physical Activity: Not on file  Stress: Not on file  Social Connections: Not on file    Allergies:  Allergies  Allergen Reactions   Percocet [Oxycodone-Acetaminophen] Itching    Metabolic Disorder Labs: No results found for: "HGBA1C", "MPG" No results found for: "PROLACTIN" No results found for: "CHOL", "TRIG", "HDL", "CHOLHDL", "VLDL", "LDLCALC" Lab Results  Component Value Date   TSH 2.743 03/08/2022    Therapeutic Level Labs: No results found for: "LITHIUM" No results found for: "VALPROATE" No results found for: "CBMZ"  Current Medications: Current Outpatient Medications  Medication Sig Dispense Refill   amLODipine (NORVASC) 5 MG tablet Take 2.5-5 mg by mouth as directed. Takes 7.5 mg daily     cyanocobalamin (VITAMIN B12) 1000 MCG/ML injection Inject into the muscle.     amitriptyline (ELAVIL) 50 MG tablet Take 1 tablet (50 mg total) by mouth at bedtime. 30 tablet  2   [START ON 05/09/2022] clonazePAM (KLONOPIN) 0.5 MG tablet Take 1 tablet (0.5 mg total) by mouth daily as needed for anxiety. 26 tablet 2   hydrOXYzine (ATARAX) 10 MG tablet Take 1-2 tablets (10-20 mg total) by mouth 2 (two) times daily as needed. For severe anxiety attacks only (Patient not taking: Reported on 04/25/2022) 60 tablet 1   indapamide (LOZOL) 2.5 MG tablet Take 2.5 mg by mouth daily. (Patient not taking: Reported on 04/25/2022)     losartan (COZAAR) 25 MG tablet Take 25 mg by mouth daily. (Patient not taking: Reported on 04/25/2022)     Na Sulfate-K Sulfate-Mg Sulf 17.5-3.13-1.6 GM/177ML SOLN Take by mouth. (Patient not taking: Reported on 04/25/2022)     progesterone (PROMETRIUM) 200 MG capsule Take 200 mg by mouth at bedtime. (Patient not taking: Reported on 04/25/2022)     SUMAtriptan (IMITREX) 100 MG tablet Take by  mouth. (Patient not taking: Reported on 04/25/2022)     zolpidem (AMBIEN) 10 MG tablet Take 1 tablet (10 mg total) by mouth at bedtime as needed for sleep. 30 tablet 2   No current facility-administered medications for this visit.     Musculoskeletal: Strength & Muscle Tone: within normal limits Gait & Station: normal Patient leans: N/A  Psychiatric Specialty Exam: Review of Systems  Neurological:  Positive for headaches (improved).  Psychiatric/Behavioral:  The patient is nervous/anxious.   All other systems reviewed and are negative.   Blood pressure 129/79, pulse 90, temperature 99.1 F (37.3 C), temperature source Oral, height '5\' 2"'$  (1.575 m), weight 127 lb 12.8 oz (58 kg).Body mass index is 23.37 kg/m.  General Appearance: Casual  Eye Contact:  Fair  Speech:  Clear and Coherent  Volume:  Normal  Mood:  Anxious coping better  Affect:  Congruent  Thought Process:  Goal Directed and Descriptions of Associations: Intact  Orientation:  Full (Time, Place, and Person)  Thought Content: Logical   Suicidal Thoughts:  No  Homicidal Thoughts:  No   Memory:  Immediate;   Fair Recent;   Fair Remote;   Fair  Judgement:  Fair  Insight:  Fair  Psychomotor Activity:  Normal  Concentration:  Concentration: Fair and Attention Span: Fair  Recall:  [employer] of Knowledge: Fair  Language: Fair  Akathisia:  No  Handed:  Right  AIMS (if indicated): not done  Assets:  Communication Skills Desire for Improvement Housing Intimacy Social Support  ADL's:  Intact  Cognition: WNL  Sleep:  Fair   Screenings: GAD-7    Flowsheet Row Office Visit from 03/14/2022 in [facility] Office Visit from 02/02/2022 in [facility]  Total GAD-7 Score 20 18      PHQ2-9    [place] Visit from 02/02/2022 in [place]  PHQ-2 Total Score 3  PHQ-9 Total Score 13      [facility] ED from 03/08/2022 in [place] Office Visit from 02/02/2022 in [place] Admission (Discharged) from 12/21/2021 in [facility] No Risk No Risk No Risk        Assessment and Plan: Ashley Collins is a 61 year old Caucasian female on SSI, married, lives in [place], has a history of anxiety, depression, migraine headache was evaluated in office today.  Patient is currently improving.  Plan as noted below.  Plan GI manage-improving Amitriptyline 50 mg p.o. nightly Clonazepam 0.5 mg as needed for severe anxiety attacks only Hydroxyzine 10 to 20 mg p.o. twice daily as needed for severe anxiety attacks Continue CBT Reviewed [place] PMP AWARxE  Other specified depression-improving Amitriptyline 50 mg p.o. nightly Continue CBT  Insomnia-improving Will need sufficient headache management. Amitriptyline 50 mg p.o. nightly  Long-term current use of benzodiazepine-Will monitor closely  Follow-up in clinic in 2 to 3 months or sooner if  needed.   This note was generated in part or whole with voice recognition software. Voice recognition is usually quite accurate but there are transcription errors that can and very often do occur. I apologize for any typographical errors that were not detected and corrected.    This note was generated in part or whole with voice recognition software. Voice recognition is usually quite accurate but there are transcription errors that can and very often do occur. I apologize for any typographical errors that were not detected  and corrected.       Ursula Alert, MD 04/25/2022, 3:00 PM

## 2022-06-28 ENCOUNTER — Ambulatory Visit: Admit: 2022-06-28 | Payer: 59

## 2022-07-25 ENCOUNTER — Ambulatory Visit: Payer: 59 | Admitting: Psychiatry

## 2022-07-25 ENCOUNTER — Encounter: Payer: Self-pay | Admitting: Psychiatry

## 2022-07-25 VITALS — BP 101/68 | HR 87 | Temp 97.9°F | Ht 62.0 in | Wt 120.8 lb

## 2022-07-25 DIAGNOSIS — F411 Generalized anxiety disorder: Secondary | ICD-10-CM

## 2022-07-25 DIAGNOSIS — G4701 Insomnia due to medical condition: Secondary | ICD-10-CM | POA: Diagnosis not present

## 2022-07-25 DIAGNOSIS — Z79899 Other long term (current) drug therapy: Secondary | ICD-10-CM

## 2022-07-25 DIAGNOSIS — F3289 Other specified depressive episodes: Secondary | ICD-10-CM

## 2022-07-25 MED ORDER — ZOLPIDEM TARTRATE 10 MG PO TABS: 10.0000 mg | ORAL_TABLET | Freq: Every evening | ORAL | 2 refills | Status: DC | PRN

## 2022-07-25 MED ORDER — CLONAZEPAM 0.5 MG PO TABS: 0.5000 mg | ORAL_TABLET | Freq: Every day | ORAL | 2 refills | Status: DC | PRN

## 2022-07-25 MED ORDER — HYDROXYZINE HCL 10 MG PO TABS: 10.0000 mg | ORAL_TABLET | Freq: Two times a day (BID) | ORAL | 1 refills | Status: DC | PRN

## 2022-07-25 MED ORDER — AMITRIPTYLINE HCL 50 MG PO TABS: 50.0000 mg | ORAL_TABLET | Freq: Every day | ORAL | 2 refills | Status: DC

## 2022-07-25 NOTE — Progress Notes (Signed)
[facility] MD OP Progress Note  07/25/2022 1:08 PM Ashley Collins  MRN:  [id number]  Chief Complaint:  Chief Complaint  Patient presents with   Follow-up   Anxiety   Depression   Medication Refill   HPI: Ashley Collins is a 62 year old Caucasian female, currently on SSI, married, lives in [place], has a history of GAD, depression, migraine headaches, kidney stones, presented for medication management.  Patient today reports she continues to work at [employer], enjoying her work.  It keeps her distracted and occupied.  It is a good outlet for her.  She reports she enjoys the team that she works with as well.  She reports holidays were stressful.  She however made it through.  Continues to struggle with the fact that her husband has dementia and is currently going through multiple changes, behavioral problems.  That does worry her.  It is also a big adjustment for her.  She however reports she has good support system from her daughters.  Patient reports overall anxiety symptoms are manageable.  Does not have any significant depression symptoms.  Reports sleep is overall fair.  Reports appetite is fair.  Patient denies any side effects to her medications.  She does have headaches and is planning to talk to neurology about Botox injection.  If she is unable to get the Botox she is interested in increasing the dosage of amitriptyline.  She however will have a discussion with neurology first.  Patient denies any suicidality, homicidality or perceptual disturbances.  Patient appeared to be alert, oriented to person place time and situation.  Patient denies any other concerns today.  Visit Diagnosis:    ICD-10-CM   1. GAD (generalized anxiety disorder)  F41.1 clonazePAM (KLONOPIN) 0.5 MG tablet    hydrOXYzine (ATARAX) 10 MG tablet    amitriptyline (ELAVIL) 50 MG tablet    2. Other specified depressive episodes  F32.89 amitriptyline (ELAVIL) 50 MG tablet   Depressive episodes with insufficinet  symptoms    3. Insomnia due to medical condition  G47.01 zolpidem (AMBIEN) 10 MG tablet   mood, headaches    4. Long-term current use of benzodiazepine  Z79.899       Past Psychiatric History: I have  reviewed past psychiatric history from progress note on 02/02/2022.  Past trials of medications like Celexa, Xanax, Ambien.  Past Medical History:  Past Medical History:  Diagnosis Date   Cancer ([place])    basal cell   Complication of anesthesia    nausea and vomiting   Headache    History of kidney stones    Hypertension    Kidney stones    PONV (postoperative nausea and vomiting)     Past Surgical History:  Procedure Laterality Date   AUGMENTATION MAMMAPLASTY     CESAREAN SECTION     x 4   COLONOSCOPY WITH PROPOFOL N/A 03/31/2022   Procedure: COLONOSCOPY WITH PROPOFOL;  Surgeon: Annamaria Helling, DO;  Location: [facility] ENDOSCOPY;  Service: Gastroenterology;  Laterality: N/A;   CYSTOSCOPY W/ RETROGRADES Bilateral 07/10/2020   Procedure: CYSTOSCOPY WITH RETROGRADE PYELOGRAM;  Surgeon: Billey Co, MD;  Location: [facility] ORS;  Service: Urology;  Laterality: Bilateral;   CYSTOSCOPY/URETEROSCOPY/HOLMIUM LASER/STENT PLACEMENT     CYSTOSCOPY/URETEROSCOPY/HOLMIUM LASER/STENT PLACEMENT Bilateral 07/10/2020   Procedure: CYSTOSCOPY/URETEROSCOPY/HOLMIUM LASER/STENT PLACEMENT;  Surgeon: Billey Co, MD;  Location: [facility] ORS;  Service: Urology;  Laterality: Bilateral;   EXTRACORPOREAL SHOCK WAVE LITHOTRIPSY     x 10 plus   Eye Lift  FACIAL COSMETIC SURGERY     GANGLION CYST EXCISION Right 12/21/2021   Procedure: REMOVAL GANGLION OF WRIST;  Surgeon: Hessie Knows, MD;  Location: [facility] ORS;  Service: Orthopedics;  Laterality: Right;   LIPOSUCTION     PLACEMENT OF BREAST IMPLANTS     TONSILLECTOMY     TRIGGER FINGER RELEASE Right 12/21/2021   Procedure: RELEASE TRIGGER FINGER/A-1 PULLEY;  Surgeon: Hessie Knows, MD;  Location: [facility] ORS;  Service: Orthopedics;  Laterality: Right;    URETEROSCOPY WITH HOLMIUM LASER LITHOTRIPSY      Family Psychiatric History: Reviewed family psychiatric history from progress note on 02/02/2022.  Family History:  Family History  Problem Relation Age of Onset   Drug abuse Daughter    Depression Daughter    Depression Son    Bladder Cancer Neg Hx    Kidney cancer Neg Hx    Prostate cancer Neg Hx     Social History: Reviewed social history from progress note on 02/02/2022. Social History   Socioeconomic History   Marital status: Married    Spouse name: Ashley Collins   Number of children: 4   Years of education: Not on file   Highest education level: High school graduate  Occupational History   Not on file  Tobacco Use   Smoking status: Never    Passive exposure: Never   Smokeless tobacco: Never  Vaping Use   Vaping Use: Never used  Substance and Sexual Activity   Alcohol use: Never   Drug use: Never   Sexual activity: Yes  Other Topics Concern   Not on file  Social History Narrative   Not on file   Social Determinants of Health   Financial Resource Strain: Not on file  Food Insecurity: Not on file  Transportation Needs: Not on file  Physical Activity: Not on file  Stress: Not on file  Social Connections: Not on file    Allergies:  Allergies  Allergen Reactions   Percocet [Oxycodone-Acetaminophen] Itching    Metabolic Disorder Labs: No results found for: "HGBA1C", "MPG" No results found for: "PROLACTIN" No results found for: "CHOL", "TRIG", "HDL", "CHOLHDL", "VLDL", "LDLCALC" Lab Results  Component Value Date   TSH 2.743 03/08/2022    Therapeutic Level Labs: No results found for: "LITHIUM" No results found for: "VALPROATE" No results found for: "CBMZ"  Current Medications: Current Outpatient Medications  Medication Sig Dispense Refill   amLODipine (NORVASC) 5 MG tablet Take 2.5-5 mg by mouth as directed. Takes 7.5 mg daily     cyanocobalamin (VITAMIN B12) 1000 MCG/ML injection Inject into the muscle.      Na Sulfate-K Sulfate-Mg Sulf 17.5-3.13-1.6 GM/177ML SOLN Take by mouth.     SUMAtriptan (IMITREX) 100 MG tablet Take by mouth.     amitriptyline (ELAVIL) 50 MG tablet Take 1 tablet (50 mg total) by mouth at bedtime. 30 tablet 2   [START ON 08/17/2022] clonazePAM (KLONOPIN) 0.5 MG tablet Take 1 tablet (0.5 mg total) by mouth daily as needed for anxiety. 26 tablet 2   hydrOXYzine (ATARAX) 10 MG tablet Take 1-2 tablets (10-20 mg total) by mouth 2 (two) times daily as needed. For severe anxiety attacks only 60 tablet 1   [START ON 08/15/2022] zolpidem (AMBIEN) 10 MG tablet Take 1 tablet (10 mg total) by mouth at bedtime as needed for sleep. 30 tablet 2   No current facility-administered medications for this visit.     Musculoskeletal: Strength & Muscle Tone: within normal limits Gait & Station: normal Patient leans: N/A  Psychiatric Specialty Exam: Review of Systems  Musculoskeletal:  Positive for back pain (Chronic, manageable.).  Psychiatric/Behavioral: Negative.    All other systems reviewed and are negative.   Blood pressure 101/68, pulse 87, temperature 97.9 F (36.6 C), temperature source Temporal, height '5\' 2"'$  (1.575 m), weight 120 lb 12.8 oz (54.8 kg), SpO2 96 %.Body mass index is 22.09 kg/m.  General Appearance: Casual  Eye Contact:  Fair  Speech:  Clear and Coherent  Volume:  Normal  Mood:  Euthymic  Affect:  Congruent  Thought Process:  Goal Directed and Descriptions of Associations: Intact  Orientation:  Full (Time, Place, and Person)  Thought Content: Logical   Suicidal Thoughts:  No  Homicidal Thoughts:  No  Memory:  Immediate;   Fair Recent;   Fair Remote;   Fair  Judgement:  Fair  Insight:  Fair  Psychomotor Activity:  Normal  Concentration:  Concentration: Fair and Attention Span: Fair  Recall:  [employer] of Knowledge: Fair  Language: Fair  Akathisia:  No  Handed:  Right  AIMS (if indicated): not done  Assets:  Communication Skills Desire for  Improvement Housing Social Support  ADL's:  Intact  Cognition: WNL  Sleep:  Fair   Screenings: GAD-7    Personnel officer Visit 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/25/2022 in [facility] Office Visit from 04/25/2022 in [place] [facility] Office Visit from 03/14/2022 in [facility] Office Visit from 02/02/2022 in [place]  Total GAD-7 Score 0 '5 20 18      '$ PHQ2-9    [place] Office Visit from 07/25/2022 in [place] Office Visit from 04/25/2022 in [facility] Office Visit from 02/02/2022 in [facility]  PHQ-2 Total Score 0 0 3  PHQ-9 Total Score '1 2 13      '$ [facility] Office Visit from 07/25/2022 in [facility] Office Visit from 04/25/2022 in [facility] ED from 03/08/2022 in [facility] Emergency Department at [place] No Risk No Risk No Risk        Assessment and Plan: Caleen Taaffe is a 62 year old Caucasian female on SSI, married, lives in [place], has a history of GAD, depression, migraine headaches was evaluated in office today.  Patient is currently stable.  Plan GAD-stable Amitriptyline 50 mg p.o. nightly Clonazepam 0.5 mg as needed for severe anxiety attacks only Hydroxyzine 10 to 20 mg p.o. twice daily as needed for severe anxiety attacks Reviewed [place] PMP AWARxE  Other specified depression-stable Amitriptyline 50 mg p.o. nightly Continue CBT  Insomnia-stable Amitriptyline 50 mg p.o. nightly Patient will continue to need sufficient headache management.  Has upcoming appointment with neurology.  Long-term current use of benzodiazepines-patient provided education.  Aware of limiting  use.  Follow-up in clinic in 3 to 4 months or sooner if needed.  This note was generated in part or whole with voice recognition software. Voice recognition is usually quite accurate but there are transcription errors that can and very often do occur. I apologize for any typographical errors that were not detected and corrected.       Ursula Alert, MD 07/25/2022, 1:08 PM

## 2022-10-04 ENCOUNTER — Ambulatory Visit: Payer: 59 | Admitting: Urology

## 2022-10-20 ENCOUNTER — Ambulatory Visit: Payer: 59 | Admitting: Urology

## 2022-10-26 ENCOUNTER — Ambulatory Visit
Admission: RE | Admit: 2022-10-26 | Discharge: 2022-10-26 | Disposition: A | Discharge status: Home / Self Care | Payer: 59 | Source: Ambulatory Visit | Attending: Urology | Admitting: Urology

## 2022-10-26 ENCOUNTER — Ambulatory Visit: Payer: 59 | Admitting: Urology

## 2022-10-26 ENCOUNTER — Ambulatory Visit
Admission: RE | Admit: 2022-10-26 | Discharge: 2022-10-26 | Disposition: A | Discharge status: Home / Self Care | Payer: 59 | Attending: Urology | Admitting: Urology

## 2022-10-26 ENCOUNTER — Other Ambulatory Visit: Payer: Self-pay

## 2022-10-26 ENCOUNTER — Encounter: Payer: Self-pay | Admitting: Urology

## 2022-10-26 VITALS — BP 123/74 | HR 94 | Ht 62.0 in | Wt 124.0 lb

## 2022-10-26 DIAGNOSIS — N200 Calculus of kidney: Secondary | ICD-10-CM | POA: Insufficient documentation

## 2022-10-26 NOTE — Addendum Note (Signed)
Addended by: Sueanne Margarita on: 10/26/2022 02:53 PM   Modules accepted: Orders

## 2022-10-26 NOTE — Progress Notes (Signed)
   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  She underwent bilateral ureteroscopy on 07/10/2020 for multiple impacted right ureteral stones that had been present at least 4 to 5 months, as well as small left distal ureteral stones and left renal stones.  A follow-up renal ultrasound in September 2022 showed borderline to minimal hydronephrosis on the right, but no other significant abnormalities, which is not surprising with her history of obstruction for 4 to 5 months prior to definitive treatment.  Overall she has been doing well.  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    Stone type was calcium oxalate.  Most recent 24-hour urine from 09/17/2021 showed persistent low urine volume of 1.12, significant improvement in urine calcium to 66, normal urine oxalate of 24, low urine citrate of 229, normal urine sodium of 63.  I last saw her in October 2023 when she reported some intermittent right-sided groin pain, urinalysis was contaminated and showed some microscopic hematuria, but KUB showed no evidence of stones.  I recommended 4-week follow-up to consider CT but her symptoms resolved and she canceled follow-up.  She denies any problems since our last visit.  No gross hematuria or urinary symptoms, no flank pain.  I personally viewed and interpreted the KUB today that shows a 2 mm right upper pole renal stone but no other definitive evidence of stone disease.  She sees PMNR for chronic back pain and gets injections that are helpful.  Previously was on indapamide for stone prevention, she opted to discontinue that medication and is not interested in resuming.  We discussed general stone prevention strategies including adequate hydration with goal of producing 2.5 L of urine daily,  increasing citric acid intake, increasing calcium intake during high oxalate meals, minimizing animal protein, and decreasing salt intake. Information about dietary recommendations given today.   RTC 1 year KUB prior   Sondra Come, MD  [facility] [address], [address] [place], [place] [address] [phone]

## 2022-10-29 ENCOUNTER — Other Ambulatory Visit: Payer: Self-pay | Admitting: Urology

## 2022-11-10 ENCOUNTER — Other Ambulatory Visit: Payer: Self-pay | Admitting: Psychiatry

## 2022-11-10 DIAGNOSIS — F411 Generalized anxiety disorder: Secondary | ICD-10-CM

## 2022-11-10 DIAGNOSIS — G4701 Insomnia due to medical condition: Secondary | ICD-10-CM

## 2022-11-16 ENCOUNTER — Ambulatory Visit: Payer: 59 | Admitting: Dermatology

## 2022-11-16 ENCOUNTER — Encounter: Payer: Self-pay | Admitting: Dermatology

## 2022-11-16 VITALS — BP 100/64

## 2022-11-16 DIAGNOSIS — L988 Other specified disorders of the skin and subcutaneous tissue: Secondary | ICD-10-CM

## 2022-11-16 DIAGNOSIS — X32XXXA Exposure to sunlight, initial encounter: Secondary | ICD-10-CM

## 2022-11-16 DIAGNOSIS — Z79899 Other long term (current) drug therapy: Secondary | ICD-10-CM

## 2022-11-16 DIAGNOSIS — L700 Acne vulgaris: Secondary | ICD-10-CM

## 2022-11-16 DIAGNOSIS — L738 Other specified follicular disorders: Secondary | ICD-10-CM | POA: Diagnosis not present

## 2022-11-16 DIAGNOSIS — L820 Inflamed seborrheic keratosis: Secondary | ICD-10-CM

## 2022-11-16 DIAGNOSIS — L578 Other skin changes due to chronic exposure to nonionizing radiation: Secondary | ICD-10-CM | POA: Diagnosis not present

## 2022-11-16 DIAGNOSIS — W908XXA Exposure to other nonionizing radiation, initial encounter: Secondary | ICD-10-CM

## 2022-11-16 DIAGNOSIS — L814 Other melanin hyperpigmentation: Secondary | ICD-10-CM

## 2022-11-16 DIAGNOSIS — Z872 Personal history of diseases of the skin and subcutaneous tissue: Secondary | ICD-10-CM

## 2022-11-16 MED ORDER — TRETINOIN 0.025 % EX CREA: TOPICAL_CREAM | Freq: Every day | CUTANEOUS | 6 refills | Status: DC

## 2022-11-16 NOTE — Patient Instructions (Addendum)
Cryotherapy Aftercare  Wash gently with soap and water everyday.   Apply Vaseline and Band-Aid daily until healed.     Due to recent changes in healthcare laws, you may see results of your pathology and/or laboratory studies on MyChart before the doctors have had a chance to review them. We understand that in some cases there may be results that are confusing or concerning to you. Please understand that not all results are received at the same time and often the doctors may need to interpret multiple results in order to provide you with the best plan of care or course of treatment. Therefore, we ask that you please give us 2 business days to thoroughly review all your results before contacting the office for clarification. Should we see a critical lab result, you will be contacted sooner.   If You Need Anything After Your Visit  If you have any questions or concerns for your doctor, please call our main line at [phone] and press option 4 to reach your doctor's medical assistant. If no one answers, please leave a voicemail as directed and we will return your call as soon as possible. Messages left after 4 pm will be answered the following business day.   You may also send us a message via MyChart. We typically respond to MyChart messages within 1-2 business days.  For prescription refills, please ask your pharmacy to contact our office. Our fax number is [phone].  If you have an urgent issue when the clinic is closed that cannot wait until the next business day, you can page your doctor at the number below.    Please note that while we do our best to be available for urgent issues outside of office hours, we are not available 24/7.   If you have an urgent issue and are unable to reach us, you may choose to seek medical care at your doctor's office, retail clinic, urgent care center, or emergency room.  If you have a medical emergency, please immediately call 911 or go to the  emergency department.  Pager Numbers  - Dr. Kowalski: [phone]  - Dr. Moye: [phone]  - Dr. Stewart: [phone]  In the event of inclement weather, please call our main line at [phone] for an update on the status of any delays or closures.  Dermatology Medication Tips: Please keep the boxes that topical medications come in in order to help keep track of the instructions about where and how to use these. Pharmacies typically print the medication instructions only on the boxes and not directly on the medication tubes.   If your medication is too expensive, please contact our office at [phone] option 4 or send us a message through MyChart.   We are unable to tell what your co-pay for medications will be in advance as this is different depending on your insurance coverage. However, we may be able to find a substitute medication at lower cost or fill out paperwork to get insurance to cover a needed medication.   If a prior authorization is required to get your medication covered by your insurance company, please allow us 1-2 business days to complete this process.  Drug prices often vary depending on where the prescription is filled and some pharmacies may offer cheaper prices.  The website www.goodrx.com contains coupons for medications through different pharmacies. The prices here do not account for what the cost may be with help from insurance (it may be cheaper with your insurance), but the website can   give you the price if you did not use any insurance.  - You can print the associated coupon and take it with your prescription to the pharmacy.  - You may also stop by our office during regular business hours and pick up a GoodRx coupon card.  - If you need your prescription sent electronically to a different pharmacy, notify our office through [place] MyChart or by phone at [phone] option 4.     Si Usted Necesita Algo Despus de Su Visita  Tambin puede  enviarnos un mensaje a travs de MyChart. Por lo general respondemos a los mensajes de MyChart en el transcurso de 1 a 2 das hbiles.  Para renovar recetas, por favor pida a su farmacia que se ponga en contacto con nuestra oficina. Nuestro nmero de fax es el [phone].  Si tiene un asunto urgente cuando la clnica est cerrada y que no puede esperar hasta el siguiente da hbil, puede llamar/localizar a su doctor(a) al nmero que aparece a continuacin.   Por favor, tenga en cuenta que aunque hacemos todo lo posible para estar disponibles para asuntos urgentes fuera del horario de oficina, no estamos disponibles las 24 horas del da, los 7 das de la semana.   Si tiene un problema urgente y no puede comunicarse con nosotros, puede optar por buscar atencin mdica  en el consultorio de su doctor(a), en una clnica privada, en un centro de atencin urgente o en una sala de emergencias.  Si tiene una emergencia mdica, por favor llame inmediatamente al 911 o vaya a la sala de emergencias.  Nmeros de bper  - Dr. Kowalski: [phone]  - Dra. Moye: [phone]  - Dra. Stewart: [phone]  En caso de inclemencias del tiempo, por favor llame a nuestra lnea principal al [phone] para una actualizacin sobre el estado de cualquier retraso o cierre.  Consejos para la medicacin en dermatologa: Por favor, guarde las cajas en las que vienen los medicamentos de uso tpico para ayudarle a seguir las instrucciones sobre dnde y cmo usarlos. Las farmacias generalmente imprimen las instrucciones del medicamento slo en las cajas y no directamente en los tubos del medicamento.   Si su medicamento es muy caro, por favor, pngase en contacto con nuestra oficina llamando al [phone] y presione la opcin 4 o envenos un mensaje a travs de MyChart.   No podemos decirle cul ser su copago por los medicamentos por adelantado ya que esto es diferente dependiendo de la cobertura de su seguro.  Sin embargo, es posible que podamos encontrar un medicamento sustituto a menor costo o llenar un formulario para que el seguro cubra el medicamento que se considera necesario.   Si se requiere una autorizacin previa para que su compaa de seguros cubra su medicamento, por favor permtanos de 1 a 2 das hbiles para completar este proceso.  Los precios de los medicamentos varan con frecuencia dependiendo del lugar de dnde se surte la receta y alguna farmacias pueden ofrecer precios ms baratos.  El sitio web www.goodrx.com tiene cupones para medicamentos de diferentes farmacias. Los precios aqu no tienen en cuenta lo que podra costar con la ayuda del seguro (puede ser ms barato con su seguro), pero el sitio web puede darle el precio si no utiliz ningn seguro.  - Puede imprimir el cupn correspondiente y llevarlo con su receta a la farmacia.  - Tambin puede pasar por nuestra oficina durante el horario de atencin regular y recoger una tarjeta de cupones de GoodRx.  -   Si necesita que su receta se enve electrnicamente a una farmacia diferente, informe a nuestra oficina a travs de MyChart de [place] o por telfono llamando al [phone] y presione la opcin 4.  

## 2022-11-16 NOTE — Progress Notes (Signed)
New Patient Visit   Subjective  Ashley Collins is a 62 y.o. female who presents for the following: check dry patch L leg, >77yr, growing, check bumps face, 53m, no treatment, hx of Aks, pt interested in Retin A  The patient has spots, moles and lesions to be evaluated, some may be new or changing and the patient may have concern these could be cancer.  New Patient referral from Manson Allan, NP.  The following portions of the chart were reviewed this encounter and updated as appropriate: medications, allergies, medical history  Review of Systems:  No other skin or systemic complaints except as noted in HPI or Assessment and Plan.  Objective  Well appearing patient in no apparent distress; mood and affect are within normal limits.   A focused examination was performed of the following areas: Left leg, face, arms  Relevant exam findings are noted in the Assessment and Plan.  R hand x 1, L forearm x 1, L proximal lat thigh x 2, R arm x 1 (5) Stuck on waxy paps with erythema   Assessment & Plan   Sebaceous Hyperplasia - Small yellow papules with a central dell - Benign-appearing - Observe. Call for changes.  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 Condition is symptomatic/ bothersome to patient. Not currently at goal.  Treatment Plan: Start Tretinoin 0.025% cr qhs to face  Topical retinoid medications like tretinoin/Retin-A, adapalene/Differin, tazarotene/Fabior, and Epiduo/Epiduo Forte can cause dryness and irritation when first started. Only apply a pea-sized amount to the entire affected area. Avoid applying it around the eyes, edges of mouth and creases at the nose. If you experience irritation, use a good moisturizer first and/or apply the medicine less often. If you are doing well with the medicine, you can increase how often you use  it until you are applying every night. Be careful with sun protection while using this medication as it can make you sensitive to the sun. This medicine should not be used by pregnant women.    Inflamed seborrheic keratosis (5) R hand x 1, L forearm x 1, L proximal lat thigh x 2, R arm x 1  Symptomatic, irritating, patient would like treated.   Destruction of lesion - R hand x 1, L forearm x 1, L proximal lat thigh x 2, R arm x 1 Complexity: simple   Destruction method: cryotherapy   Informed consent: discussed and consent obtained   Timeout:  patient name, date of birth, surgical site, and procedure verified Lesion destroyed using liquid nitrogen: Yes   Region frozen until ice ball extended beyond lesion: Yes   Outcome: patient tolerated procedure well with no complications   Post-procedure details: wound care instructions given     ACTINIC DAMAGE - chronic, secondary to cumulative UV radiation exposure/sun exposure over time - diffuse scaly erythematous macules with underlying dyspigmentation - Recommend daily broad spectrum sunscreen SPF 30+ to sun-exposed areas, reapply every 2 hours as needed.  - Recommend staying in the shade or wearing long sleeves, sun glasses (UVA+UVB protection) and wide brim hats (4-inch brim around the entire circumference of the hat). - Call for new or changing lesions.   LENTIGINES Exam: scattered tan macules Due to sun exposure Treatment Plan: Benign-appearing, observe. Recommend daily broad spectrum sunscreen SPF 30+ to sun-exposed areas, reapply every 2 hours as needed.  Call for any changes  FACIAL ELASTOSIS Exam: Rhytides and volume loss.  Treatment Plan: Discussed Alastin Restorative Eye cream, Eyelight to under eyes, Discussed filler to lips and corners of mouth 0.5cc Volbella or Restylane Refyne Discussed Botox 20 units to frown complex  Recommend daily broad spectrum sunscreen SPF 30+ to sun-exposed areas, reapply every 2 hours as  needed. Call for new or changing lesions.  Staying in the shade or wearing long sleeves, sun glasses (UVA+UVB protection) and wide brim hats (4-inch brim around the entire circumference of the hat) are also recommended for sun protection.    Return for 40m for filler and Botox.  I, Ardis Rowan, RMA, am acting as scribe for Armida Sans, MD .  Documentation: I have reviewed the above documentation for accuracy and completeness, and I agree with the above.  Armida Sans, MD

## 2022-11-22 ENCOUNTER — Telehealth (INDEPENDENT_AMBULATORY_CARE_PROVIDER_SITE_OTHER): Payer: 59 | Admitting: Psychiatry

## 2022-11-22 ENCOUNTER — Encounter: Payer: Self-pay | Admitting: Psychiatry

## 2022-11-22 DIAGNOSIS — F411 Generalized anxiety disorder: Secondary | ICD-10-CM | POA: Diagnosis not present

## 2022-11-22 DIAGNOSIS — Z79899 Other long term (current) drug therapy: Secondary | ICD-10-CM | POA: Diagnosis not present

## 2022-11-22 DIAGNOSIS — G4701 Insomnia due to medical condition: Secondary | ICD-10-CM | POA: Diagnosis not present

## 2022-11-22 DIAGNOSIS — F3289 Other specified depressive episodes: Secondary | ICD-10-CM

## 2022-11-22 MED ORDER — BUSPIRONE HCL 10 MG PO TABS
10.0000 mg | ORAL_TABLET | Freq: Two times a day (BID) | ORAL | 1 refills | Status: DC
Start: 2022-11-22 — End: 2023-01-17

## 2022-11-22 MED ORDER — HYDROXYZINE HCL 10 MG PO TABS
10.0000 mg | ORAL_TABLET | Freq: Two times a day (BID) | ORAL | 1 refills | Status: DC | PRN
Start: 2022-11-22 — End: 2023-01-19

## 2022-11-22 NOTE — Progress Notes (Unsigned)
Virtual Visit via Video Note  I connected with Ashley Collins on 11/22/22 at  3:30 PM EDT by a video enabled telemedicine application and verified that I am speaking with the correct person using two identifiers.  Location Provider Location : Remote office Patient Location : Home  Participants: Patient , Provider    I discussed the limitations of evaluation and management by telemedicine and the availability of in person appointments. The patient expressed understanding and agreed to proceed.    I discussed the assessment and treatment plan with the patient. The patient was provided an opportunity to ask questions and all were answered. The patient agreed with the plan and demonstrated an understanding of the instructions.   The patient was advised to call back or seek an in-person evaluation if the symptoms worsen or if the condition fails to improve as anticipated.    BH MD OP Progress Note  11/23/2022 4:38 PM Ashley Collins  MRN:  [id number]  Chief Complaint:  Chief Complaint  Patient presents with   Follow-up   Anxiety   Depression   Medication Refill   HPI: Ashley Collins is a 62 year old Caucasian female, currently on SSI, married, lives in [place], has a history of GAD, depression, migraine headaches, kidney stones, presented for medication management.  Patient today reports she continues to struggle with situational stressors.  Her husband who has dementia is currently declining cognitively.  That has been extremely stressful.  She reports she has been attending dementia support groups for caretakers.  That has helped her.  Patient also reports support system from her daughter.  She reports work is a good distraction for her and helps her emotionally.  Patient however reports she does have significant anxiety when it comes to dealing with her day-to-day life, taking care of her spouse especially.  She does not believe the current dosage of clonazepam is beneficial and  wonders whether she should go up on the dosage.  She has been using it almost every day.  Patient currently compliant on amitriptyline 75 mg daily.  Prescribed by her primary care provider for headaches.  It is also an antianxiety/antidepressant and has been beneficial for her mood.  Agreeable to reach out to her primary care provider, could increase the dosage to benefit her mood symptoms.  Patient denies any suicidality, homicidality or perceptual disturbances.  Reports sleep is overall good.  Patient denies any other concerns today.  Visit Diagnosis:    ICD-10-CM   1. GAD (generalized anxiety disorder)  F41.1 busPIRone (BUSPAR) 10 MG tablet    hydrOXYzine (ATARAX) 10 MG tablet    2. 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 Insomnia due to medical condition  G47.01    Mood    4. Long-term current use of benzodiazepine  Z79.899       Past Psychiatric History: I have reviewed past psychiatric history from progress note on 02/02/2022.  Past trials of medications like Celexa, Xanax, Ambien  Past Medical History:  Past Medical History:  Diagnosis Date   Actinic keratosis    Cancer (HCC)    basal cell   Complication of anesthesia    nausea and vomiting   Headache    History of kidney stones    Hypertension    Kidney stones    PONV (postoperative nausea and vomiting)     Past Surgical History:  Procedure Laterality Date   AUGMENTATION MAMMAPLASTY  CESAREAN SECTION     x 4   COLONOSCOPY WITH PROPOFOL N/A 03/31/2022   Procedure: COLONOSCOPY WITH PROPOFOL;  Surgeon: Jaynie Collins, DO;  Location: [facility] ENDOSCOPY;  Service: Gastroenterology;  Laterality: N/A;   CYSTOSCOPY W/ RETROGRADES Bilateral 07/10/2020   Procedure: CYSTOSCOPY WITH RETROGRADE PYELOGRAM;  Surgeon: Sondra Come, MD;  Location: [facility] ORS;  Service: Urology;  Laterality: Bilateral;    CYSTOSCOPY/URETEROSCOPY/HOLMIUM LASER/STENT PLACEMENT     CYSTOSCOPY/URETEROSCOPY/HOLMIUM LASER/STENT PLACEMENT Bilateral 07/10/2020   Procedure: CYSTOSCOPY/URETEROSCOPY/HOLMIUM LASER/STENT PLACEMENT;  Surgeon: Sondra Come, MD;  Location: [facility] ORS;  Service: Urology;  Laterality: Bilateral;   EXTRACORPOREAL SHOCK WAVE LITHOTRIPSY     x 10 plus   Eye Lift     FACIAL COSMETIC SURGERY     GANGLION CYST EXCISION Right 12/21/2021   Procedure: REMOVAL GANGLION OF WRIST;  Surgeon: Kennedy Bucker, MD;  Location: [facility] ORS;  Service: Orthopedics;  Laterality: Right;   LIPOSUCTION     PLACEMENT OF BREAST IMPLANTS     TONSILLECTOMY     TRIGGER FINGER RELEASE Right 12/21/2021   Procedure: RELEASE TRIGGER FINGER/A-1 PULLEY;  Surgeon: Kennedy Bucker, MD;  Location: [facility] ORS;  Service: Orthopedics;  Laterality: Right;   URETEROSCOPY WITH HOLMIUM LASER LITHOTRIPSY      Family Psychiatric History: I have reviewed family psychiatric history from progress note on 02/02/2022.  Family History:  Family History  Problem Relation Age of Onset   Drug abuse Daughter    Depression Daughter    Depression Son    Bladder Cancer Neg Hx    Kidney cancer Neg Hx    Prostate cancer Neg Hx     Social History: I have reviewed social history from progress note on 02/02/2022. Social History   Socioeconomic History   Marital status: Married    Spouse name: james   Number of children: 4   Years of education: Not on file   Highest education level: High school graduate  Occupational History   Not on file  Tobacco Use   Smoking status: Never    Passive exposure: Never   Smokeless tobacco: Never  Vaping Use   Vaping Use: Never used  Substance and Sexual Activity   Alcohol use: Never   Drug use: Never   Sexual activity: Yes  Other Topics Concern   Not on file  Social History Narrative   Not on file   Social Determinants of Health   Financial Resource Strain: Not on file  Food Insecurity: Not on file   Transportation Needs: Not on file  Physical Activity: Not on file  Stress: Not on file  Social Connections: Not on file    Allergies:  Allergies  Allergen Reactions   Percocet [Oxycodone-Acetaminophen] Itching    Metabolic Disorder Labs: No results found for: "HGBA1C", "MPG" No results found for: "PROLACTIN" No results found for: "CHOL", "TRIG", "HDL", "CHOLHDL", "VLDL", "LDLCALC" Lab Results  Component Value Date   TSH 2.743 03/08/2022    Therapeutic Level Labs: No results found for: "LITHIUM" No results found for: "VALPROATE" No results found for: "CBMZ"  Current Medications: Current Outpatient Medications  Medication Sig Dispense Refill   busPIRone (BUSPAR) 10 MG tablet Take 1 tablet (10 mg total) by mouth 2 (two) times daily. 60 tablet 1   losartan (COZAAR) 25 MG tablet Take 25 mg by mouth daily.     methocarbamol (ROBAXIN) 500 MG tablet Take 500 mg by mouth daily.     ondansetron (ZOFRAN) 4 MG tablet Take 4 mg by  mouth every 8 (eight) hours as needed.     amitriptyline (ELAVIL) 75 MG tablet Take 75 mg by mouth at bedtime.     amLODipine (NORVASC) 5 MG tablet Take 2.5-5 mg by mouth as directed. Takes 7.5 mg daily     clonazePAM (KLONOPIN) 0.5 MG tablet TAKE 1 TABLET BY MOUTH DAILY AS NEEDED FOR ANXIETY DNFB 08/17/2022 26 tablet 2   cyanocobalamin (VITAMIN B12) 1000 MCG/ML injection Inject into the muscle.     hydrOXYzine (ATARAX) 10 MG tablet Take 1-2 tablets (10-20 mg total) by mouth 2 (two) times daily as needed. For severe anxiety attacks only 60 tablet 1   KLOR-CON M20 20 MEQ tablet Take 20 mEq by mouth daily.     Na Sulfate-K Sulfate-Mg Sulf 17.5-3.13-1.6 GM/177ML SOLN Take by mouth.     SUMAtriptan (IMITREX) 100 MG tablet Take by mouth. (Patient not taking: Reported on 11/16/2022)     tretinoin (RETIN-A) 0.025 % cream Apply topically at bedtime. Pea size amount to face nightly as tolerated 45 g 6   zolpidem (AMBIEN) 10 MG tablet TAKE ONE TABLET BY MOUTH EVERY NIGHT  AT BEDTIME AS NEEDED FOR SLEEP 30 tablet 2   No current facility-administered medications for this visit.     Musculoskeletal: Strength & Muscle Tone:  UTA Gait & Station:  Seated Patient leans: N/A  Psychiatric Specialty Exam: Review of Systems  Psychiatric/Behavioral:  The patient is nervous/anxious.     There were no vitals taken for this visit.There is no height or weight on file to calculate BMI.  General Appearance: Casual  Eye Contact:  Fair  Speech:  Clear and Coherent  Volume:  Normal  Mood:  Anxious  Affect:  Congruent  Thought Process:  Goal Directed and Descriptions of Associations: Intact  Orientation:  Full (Time, Place, and Person)  Thought Content: Logical   Suicidal Thoughts:  No  Homicidal Thoughts:  No  Memory:  Immediate;   Fair Recent;   Fair Remote;   Fair  Judgement:  Fair  Insight:  Fair  Psychomotor Activity:  Normal  Concentration:  Concentration: Fair and Attention Span: Fair  Recall:  [employer] of Knowledge: Fair  Language: Fair  Akathisia:  No  Handed:  Right  AIMS (if indicated): not done  Assets:  Communication Skills Desire for Improvement Housing Talents/Skills Transportation  ADL's:  Intact  Cognition: WNL  Sleep:  Fair   Screenings: GAD-7    Garment/textile technologist Visit from 07/25/2022 in [facility] Office Visit from 04/25/2022 in [facility] Office Visit from 03/14/2022 in [facility] Office Visit from 02/02/2022 in [facility]  Total GAD-7 Score 0 5 20 18       PHQ2-9    Flowsheet Row Office Visit from 07/25/2022 in [facility] Office Visit from 04/25/2022 in [facility] Office Visit from 02/02/2022 in [facility]  PHQ-2 Total Score 0 0 3  PHQ-9  Total Score 1 2 13       Flowsheet Row Video Visit from 11/22/2022 in [facility] Office Visit from 07/25/2022 in [facility] Office Visit from 04/25/2022 in [facility]  C-SSRS RISK CATEGORY No Risk No Risk No Risk        Assessment and Plan: Ashley Collins is a 62 year old Caucasian female  on SSI, married, lives in [place], has a history of GAD, depression, migraine headaches was evaluated by telemedicine today.  Patient with situational stressors, with worsening anxiety, will benefit from the following plan.  Plan GAD-unstable Continue amitriptyline 75 mg p.o. nightly.  However will consider increasing the dosage to 100 mg as needed in the future.  Will coordinate care with primary care provider. Continue clonazepam 0.5 mg as needed for severe anxiety attacks only, patient advised to limit use. Start BuSpar 10 mg p.o. twice daily. Hydroxyzine 10 to 20 mg p.o. twice daily as needed for severe anxiety attacks Reviewed [place] PMP AWARxE  Other specified depression-stable Continue amitriptyline as prescribed.  Insomnia-stable Amitriptyline 75 mg p.o. nightly  Long-term current use of benzodiazepine-patient provided education.  Advised to limit use.  Follow-up in clinic in 2 months or sooner if needed.   Collaboration of Care: Collaboration of Care: Other I have attempted to coordinate care with primary care provider-I have sent communication to consider readjusting the dosage of amitriptyline.  Patient/Guardian was advised Release of Information must be obtained prior to any record release in order to collaborate their care with an outside provider. Patient/Guardian was advised if they have not already done so to contact the registration department to sign all necessary forms in order for [place] to release information regarding their care.   Consent: Patient/Guardian  gives verbal consent for treatment and assignment of benefits for services provided during this visit. Patient/Guardian expressed understanding and agreed to proceed.  This note was generated in part or whole with voice recognition software. Voice recognition is usually quite accurate but there are transcription errors that can and very often do occur. I apologize for any typographical errors that were not detected and corrected.     Jomarie Longs, MD 11/23/2022, 4:38 PM

## 2022-11-22 NOTE — Patient Instructions (Signed)
Buspirone Tablets What is this medication? BUSPIRONE (byoo SPYE rone) treats anxiety. It works by balancing the levels of dopamine and serotonin in your brain, substances that help regulate mood. This medicine may be used for other purposes; ask your health care provider or pharmacist if you have questions. COMMON BRAND NAME(S): BuSpar, Buspar Dividose What should I tell my care team before I take this medication? They need to know if you have any of these conditions: Kidney or liver disease An unusual or allergic reaction to buspirone, other medications, foods, dyes, or preservatives Pregnant or trying to get pregnant Breast-feeding How should I use this medication? Take this medication by mouth with a glass of water. Follow the directions on the prescription label. You may take this medication with or without food. To ensure that this medication always works the same way for you, you should take it either always with or always without food. Take your doses at regular intervals. Do not take your medication more often than directed. Do not stop taking except on the advice of your care team. Talk to your care team about the use of this medication in children. Special care may be needed. Overdosage: If you think you have taken too much of this medicine contact a poison control center or emergency room at once. NOTE: This medicine is only for you. Do not share this medicine with others. What if I miss a dose? If you miss a dose, take it as soon as you can. If it is almost time for your next dose, take only that dose. Do not take double or extra doses. What may interact with this medication? Do not take this medication with any of the following: Linezolid MAOIs like Carbex, Eldepryl, Marplan, Nardil, and Parnate Methylene blue Procarbazine This medication may also interact with the following: Diazepam Digoxin Diltiazem Erythromycin Grapefruit juice Haloperidol Medications for mental  depression or mood problems Medications for seizures like carbamazepine, phenobarbital and phenytoin Nefazodone Other medications for anxiety Rifampin Ritonavir Some antifungal medications like itraconazole, ketoconazole, and voriconazole Verapamil Warfarin This list may not describe all possible interactions. Give your health care provider a list of all the medicines, herbs, non-prescription drugs, or dietary supplements you use. Also tell them if you smoke, drink alcohol, or use illegal drugs. Some items may interact with your medicine. What should I watch for while using this medication? Visit your care team for regular checks on your progress. It may take 1 to 2 weeks before your anxiety gets better. This medication may affect your coordination, reaction time, or judgment. Do not drive or operate machinery until you know how this medication affects you. Sit up or stand slowly to reduce the risk of dizzy or fainting spells. Drinking alcohol with this medication can increase the risk of these side effects. What side effects may I notice from receiving this medication? Side effects that you should report to your care team as soon as possible: Allergic reactions--skin rash, itching, hives, swelling of the face, lips, tongue, or throat Irritability, confusion, fast or irregular heartbeat, muscle stiffness, twitching muscles, sweating, high fever, seizure, chills, vomiting, diarrhea, which may be signs of serotonin syndrome Side effects that usually do not require medical attention (report to your care team if they continue or are bothersome): Anxiety, nervousness Dizziness Drowsiness Headache Nausea Trouble sleeping This list may not describe all possible side effects. Call your doctor for medical advice about side effects. You may report side effects to FDA at 1-800-FDA-1088. Where should I keep  my medication? Keep out of the reach of children. Store at room temperature below 30 degrees C  (86 degrees F). Protect from light. Keep container tightly closed. Throw away any unused medication after the expiration date. NOTE: This sheet is a summary. It may not cover all possible information. If you have questions about this medicine, talk to your doctor, pharmacist, or health care provider.  2024 Elsevier/Gold Standard (2022-01-03 00:00:00)

## 2022-11-23 ENCOUNTER — Ambulatory Visit: Payer: 59 | Admitting: Psychiatry

## 2022-11-29 ENCOUNTER — Encounter: Payer: Self-pay | Admitting: Dermatology

## 2022-12-26 ENCOUNTER — Telehealth: Payer: 59 | Admitting: Psychiatry

## 2023-01-03 ENCOUNTER — Ambulatory Visit: Payer: 59 | Admitting: Dermatology

## 2023-01-11 ENCOUNTER — Telehealth: Payer: 59 | Admitting: Psychiatry

## 2023-01-17 ENCOUNTER — Other Ambulatory Visit: Payer: Self-pay | Admitting: Psychiatry

## 2023-01-17 DIAGNOSIS — F3289 Other specified depressive episodes: Secondary | ICD-10-CM

## 2023-01-17 DIAGNOSIS — F411 Generalized anxiety disorder: Secondary | ICD-10-CM

## 2023-01-19 ENCOUNTER — Telehealth (INDEPENDENT_AMBULATORY_CARE_PROVIDER_SITE_OTHER): Payer: 59 | Admitting: Psychiatry

## 2023-01-19 ENCOUNTER — Encounter: Payer: Self-pay | Admitting: Psychiatry

## 2023-01-19 DIAGNOSIS — G4701 Insomnia due to medical condition: Secondary | ICD-10-CM

## 2023-01-19 DIAGNOSIS — F411 Generalized anxiety disorder: Secondary | ICD-10-CM

## 2023-01-19 DIAGNOSIS — F3289 Other specified depressive episodes: Secondary | ICD-10-CM

## 2023-01-19 DIAGNOSIS — Z79899 Other long term (current) drug therapy: Secondary | ICD-10-CM | POA: Diagnosis not present

## 2023-01-19 MED ORDER — ZOLPIDEM TARTRATE 10 MG PO TABS
10.0000 mg | ORAL_TABLET | Freq: Every evening | ORAL | 2 refills | Status: DC | PRN
Start: 2023-02-07 — End: 2023-05-02

## 2023-01-19 MED ORDER — HYDROXYZINE HCL 10 MG PO TABS
10.0000 mg | ORAL_TABLET | Freq: Two times a day (BID) | ORAL | 2 refills | Status: DC | PRN
Start: 2023-01-19 — End: 2023-04-03

## 2023-01-19 MED ORDER — AMITRIPTYLINE HCL 100 MG PO TABS
100.0000 mg | ORAL_TABLET | Freq: Every day | ORAL | 0 refills | Status: DC
Start: 2023-01-19 — End: 2023-04-03

## 2023-01-19 MED ORDER — CLONAZEPAM 0.5 MG PO TABS
0.5000 mg | ORAL_TABLET | Freq: Two times a day (BID) | ORAL | 2 refills | Status: DC | PRN
Start: 2023-02-07 — End: 2023-04-03

## 2023-01-19 NOTE — Progress Notes (Signed)
Virtual Visit via Video Note  I connected with Ashley Collins on 01/19/23 at 11:00 AM EDT by a video enabled telemedicine application and verified that I am speaking with the correct person using two identifiers.  Location Provider Location : [facility] Patient Location : Home  Participants: Patient , Provider    I discussed the limitations of evaluation and management by telemedicine and the availability of in person appointments. The patient expressed understanding and agreed to proceed.    I discussed the assessment and treatment plan with the patient. The patient was provided an opportunity to ask questions and all were answered. The patient agreed with the plan and demonstrated an understanding of the instructions.   The patient was advised to call back or seek an in-person evaluation if the symptoms worsen or if the condition fails to improve as anticipated.  BH MD OP Progress Note  01/20/2023 12:41 PM Ashley Collins  MRN:  [id number]  Chief Complaint:  Chief Complaint  Patient presents with   Follow-up   Anxiety   Depression   Medication Refill   HPI: Ashley Collins is a 62 year old Caucasian female, currently on SSI, married, lives in [place], has a history of GAD, depression, migraine headaches, insomnia, kidney stones, presented for medication management was evaluated by telemedicine today.  Patient today reports she is currently not depressed.  She does have anxiety usually situational.  Her husband has dementia and that has been stressful.  She however reports she is currently getting help from her son who comes in to help out few times a week and her daughter who helps out by taking her husband to her home on weekends when she has to work.  Patient reports that does help her with her anxiety level.  Patient however reports currently she is worried about her headaches.  She has a history of migraine headaches and none of the medications seems to help.  Recently she had an  appointment with a headache specialist and is currently on new medications which she has started taking in the past few days.  She does report anxiety symptoms like nervousness, worrying about health/headaches in the past several weeks.  Patient wonders if going up on the amitriptyline could help with her anxiety as well as headaches.  Patient reports she had to stop using the BuSpar since the BuSpar made her dizzy.  Patient reports sleep as good as long as she takes the Ambien.  Patient denies any suicidality, homicidality or perceptual disturbances.  Patient denies any other concerns today.  Visit Diagnosis:    ICD-10-CM   1. GAD (generalized anxiety disorder)  F41.1 amitriptyline (ELAVIL) 100 MG tablet    clonazePAM (KLONOPIN) 0.5 MG tablet    hydrOXYzine (ATARAX) 10 MG tablet    2. Other specified depressive episodes  F32.89 amitriptyline (ELAVIL) 100 MG tablet   Depressive episodes with insufficient symptoms.    3. Insomnia due to medical condition  G47.01 zolpidem (AMBIEN) 10 MG tablet   mood, headaches    4. Long-term current use of benzodiazepine  Z79.899       Past Psychiatric History: I have reviewed past psychiatric history from progress note on 02/02/2022.  Past trials of medications like Celexa, Xanax, Ambien.  Past Medical History:  Past Medical History:  Diagnosis Date   Actinic keratosis    Cancer (HCC)    basal cell   Complication of anesthesia    nausea and vomiting   Headache    History of kidney stones  Hypertension    Kidney stones    PONV (postoperative nausea and vomiting)     Past Surgical History:  Procedure Laterality Date   AUGMENTATION MAMMAPLASTY     CESAREAN SECTION     x 4   COLONOSCOPY WITH PROPOFOL N/A 03/31/2022   Procedure: COLONOSCOPY WITH PROPOFOL;  Surgeon: Jaynie Collins, DO;  Location: [facility];  Service: Gastroenterology;  Laterality: N/A;   CYSTOSCOPY W/ RETROGRADES Bilateral 07/10/2020   Procedure: CYSTOSCOPY WITH  RETROGRADE PYELOGRAM;  Surgeon: Sondra Come, MD;  Location: [facility];  Service: Urology;  Laterality: Bilateral;   CYSTOSCOPY/URETEROSCOPY/HOLMIUM LASER/STENT PLACEMENT     CYSTOSCOPY/URETEROSCOPY/HOLMIUM LASER/STENT PLACEMENT Bilateral 07/10/2020   Procedure: CYSTOSCOPY/URETEROSCOPY/HOLMIUM LASER/STENT PLACEMENT;  Surgeon: Sondra Come, MD;  Location: [facility];  Service: Urology;  Laterality: Bilateral;   EXTRACORPOREAL SHOCK WAVE LITHOTRIPSY     x 10 plus   Eye Lift     FACIAL COSMETIC SURGERY     GANGLION CYST EXCISION Right 12/21/2021   Procedure: REMOVAL GANGLION OF WRIST;  Surgeon: Kennedy Bucker, MD;  Location: [facility] OR[facility];  Service: Orthopedics;  Laterality: Right;   LIPOSUCTION     PLACEMENT OF BREAST IMPLANTS     TONSILLECTOMY     TRIGGER FINGER RELEASE Right 12/21/2021   Procedure: RELEASE TRIGGER FINGER/A-1 PULLEY;  Surgeon: Kennedy Bucker, MD;  Location: [facility] OR[facility];  Service: Orthopedics;  Laterality: Right;   URETEROSCOPY WITH HOLMIUM LASER LITHOTRIPSY      Family Psychiatric History: I have reviewed family psychiatric history from progress note on 02/02/2022.  Family History:  Family History  Problem Relation Age of Onset   Drug abuse Daughter    Depression Daughter    Depression Son    Bladder Cancer Neg Hx    Kidney cancer Neg Hx    Prostate cancer Neg Hx     Social History: I have reviewed social history from progress note on 02/02/2022. Social History   Socioeconomic History   Marital status: Married    Spouse name: Ashley Collins   Number of children: 4   Years of education: Not on file   Highest education level: High school graduate  Occupational History   Not on file  Tobacco Use   Smoking status: Never    Passive exposure: Never   Smokeless tobacco: Never  Vaping Use   Vaping status: Never Used  Substance and Sexual Activity   Alcohol use: Never   Drug use: Never   Sexual activity: Yes  Other Topics Concern   Not on file  Social History Narrative   Not  on file   Social Determinants of Health   Financial Resource Strain: Not on file  Food Insecurity: Not on file  Transportation Needs: Not on file  Physical Activity: Not on file  Stress: Not on file  Social Connections: Not on file    Allergies:  Allergies  Allergen Reactions   Percocet [Oxycodone-Acetaminophen] Itching    Metabolic Disorder Labs: No results found for: "HGBA1C", "MPG" No results found for: "PROLACTIN" No results found for: "CHOL", "TRIG", "HDL", "CHOLHDL", "VLDL", "LDLCALC" Lab Results  Component Value Date   TSH 2.743 03/08/2022    Therapeutic Level Labs: No results found for: "LITHIUM" No results found for: "VALPROATE" No results found for: "CBMZ"  Current Medications: Current Outpatient Medications  Medication Sig Dispense Refill   amitriptyline (ELAVIL) 100 MG tablet Take 1 tablet (100 mg total) by mouth at bedtime. 90 tablet 0   amLODipine (NORVASC) 5 MG tablet Take 2.5-5 mg  by mouth as directed. Takes 7.5 mg daily     cyanocobalamin (VITAMIN B12) 1000 MCG/ML injection Inject into the muscle.     ketorolac (TORADOL) 10 MG tablet Take 10 mg by mouth.     KLOR-CON M20 20 MEQ tablet Take 20 mEq by mouth daily.     losartan (COZAAR) 25 MG tablet Take 25 mg by mouth daily.     methocarbamol (ROBAXIN) 500 MG tablet Take 500 mg by mouth daily.     naratriptan (AMERGE) 2.5 MG tablet Take by mouth.     promethazine (PHENERGAN) 25 MG tablet Take 25 mg by mouth.     tretinoin (RETIN-A) 0.025 % cream Apply topically at bedtime. Pea size amount to face nightly as tolerated 45 g 6   [START ON 02/07/2023] clonazePAM (KLONOPIN) 0.5 MG tablet Take 1 tablet (0.5 mg total) by mouth 2 (two) times daily as needed for anxiety. 26 tablet 2   hydrOXYzine (ATARAX) 10 MG tablet Take 1-2 tablets (10-20 mg total) by mouth 2 (two) times daily as needed. For severe anxiety attacks only 60 tablet 2   Na Sulfate-K Sulfate-Mg Sulf 17.5-3.13-1.6 GM/177ML SOLN Take by mouth. (Patient  not taking: Reported on 01/19/2023)     ondansetron (ZOFRAN) 4 MG tablet Take 4 mg by mouth every 8 (eight) hours as needed. (Patient not taking: Reported on 01/19/2023)     [START ON 02/07/2023] zolpidem (AMBIEN) 10 MG tablet Take 1 tablet (10 mg total) by mouth at bedtime as needed for sleep. 30 tablet 2   No current facility-administered medications for this visit.     Musculoskeletal: Strength & Muscle Tone:  UTA Gait & Station:  Seated Patient leans: N/A  Psychiatric Specialty Exam: Review of Systems  Neurological:  Positive for headaches (migraine).  Psychiatric/Behavioral:  The patient is nervous/anxious.     There were no vitals taken for this visit.There is no height or weight on file to calculate BMI.  General Appearance: Fairly Groomed  Eye Contact:  Fair  Speech:  Clear and Coherent  Volume:  Normal  Mood:  Anxious  Affect:  Congruent  Thought Process:  Goal Directed and Descriptions of Associations: Intact  Orientation:  Full (Time, Place, and Person)  Thought Content: Logical   Suicidal Thoughts:  No  Homicidal Thoughts:  No  Memory:  Immediate;   Fair Recent;   Fair Remote;   Fair  Judgement:  Fair  Insight:  Fair  Psychomotor Activity:  Normal  Concentration:  Concentration: Fair and Attention Span: Fair  Recall:  [employer] of Knowledge: Fair  Language: Fair  Akathisia:  No  Handed:  Right  AIMS (if indicated): not done  Assets:  Communication Skills Desire for Improvement Housing Social Support  ADL's:  Intact  Cognition: WNL  Sleep:   fair on medications   Screenings: GAD-7    Flowsheet Row Office Visit from 07/25/2022 in [facility] Office Visit from 04/25/2022 in [facility] Office Visit from 03/14/2022 in [facility] Office Visit from 02/02/2022 in [facility]  Total GAD-7 Score 0 5 20  18       PHQ2-9    Flowsheet Row Office Visit from 07/25/2022 in [facility] Office Visit from 04/25/2022 in [facility] Office Visit from 02/02/2022 in [facility]  PHQ-2 Total Score 0 0 3  PHQ-9 Total  Score 1 2 13       Flowsheet Row Video Visit from 01/19/2023 in [facility] Video Visit from 11/22/2022 in [facility] Office Visit from 07/25/2022 in [facility]  C-SSRS RISK CATEGORY No Risk No Risk No Risk        Assessment and Plan: Illene Sweeting is a 62 year old Caucasian female on SSI, married, lives in [place], has a history of GAD, depression, migraine headaches was evaluated by telemedicine today.  Patient is currently having significant headaches, constant, currently under the care of headache specialist although continues to have anxiety regarding the same, will benefit from following medication changes.  Plan as noted below.  Plan GAD-some improvement Increase amitriptyline to 100 mg p.o. nightly Continue clonazepam 0.5 mg daily as needed for severe anxiety attacks-patient to limit use Discontinue BuSpar due to side effects Hydroxyzine 10 to 20 mg p.o. twice daily as needed for severe anxiety attacks Reviewed [place] PMP AWARxE  Other specified depression-stable Continue amitriptyline as prescribed  Insomnia-stable Continue Ambien 10 mg p.o. nightly as needed.  Long-term current use of benzodiazepine-patient to continue to limit use.  Follow-up in clinic in 3 months or sooner in person.   Collaboration of Care: Collaboration of Care: Other patient encouraged to continue follow-up with headache specialist.  Patient/Guardian was advised Release of Information must be obtained prior to any record release in order to collaborate their  care with an outside provider. Patient/Guardian was advised if they have not already done so to contact the registration department to sign all necessary forms in order for [place] to release information regarding their care.   Consent: Patient/Guardian gives verbal consent for treatment and assignment of benefits for services provided during this visit. Patient/Guardian expressed understanding and agreed to proceed.   This note was generated in part or whole with voice recognition software. Voice recognition is usually quite accurate but there are transcription errors that can and very often do occur. I apologize for any typographical errors that were not detected and corrected.    Jomarie Longs, MD 01/20/2023, 12:41 PM

## 2023-03-15 ENCOUNTER — Other Ambulatory Visit: Payer: Self-pay | Admitting: Psychiatry

## 2023-03-15 DIAGNOSIS — F411 Generalized anxiety disorder: Secondary | ICD-10-CM

## 2023-03-15 DIAGNOSIS — F3289 Other specified depressive episodes: Secondary | ICD-10-CM

## 2023-03-16 ENCOUNTER — Other Ambulatory Visit: Payer: Self-pay | Admitting: Psychiatry

## 2023-03-16 DIAGNOSIS — F411 Generalized anxiety disorder: Secondary | ICD-10-CM

## 2023-03-16 DIAGNOSIS — F3289 Other specified depressive episodes: Secondary | ICD-10-CM

## 2023-04-03 ENCOUNTER — Encounter: Payer: Self-pay | Admitting: Psychiatry

## 2023-04-03 ENCOUNTER — Ambulatory Visit (INDEPENDENT_AMBULATORY_CARE_PROVIDER_SITE_OTHER): Payer: 59 | Admitting: Psychiatry

## 2023-04-03 VITALS — BP 125/73 | HR 84 | Temp 97.8°F | Ht 62.0 in | Wt 118.4 lb

## 2023-04-03 DIAGNOSIS — F3289 Other specified depressive episodes: Secondary | ICD-10-CM

## 2023-04-03 DIAGNOSIS — Z79899 Other long term (current) drug therapy: Secondary | ICD-10-CM

## 2023-04-03 DIAGNOSIS — G4701 Insomnia due to medical condition: Secondary | ICD-10-CM | POA: Diagnosis not present

## 2023-04-03 DIAGNOSIS — F411 Generalized anxiety disorder: Secondary | ICD-10-CM | POA: Diagnosis not present

## 2023-04-03 MED ORDER — LORAZEPAM 0.5 MG PO TABS: 0.5000 mg | ORAL_TABLET | ORAL | 0 refills | Status: DC

## 2023-04-03 MED ORDER — AMITRIPTYLINE HCL 75 MG PO TABS: 75.0000 mg | ORAL_TABLET | Freq: Every day | ORAL | Status: DC

## 2023-04-03 NOTE — Patient Instructions (Signed)
Lorazepam Tablets What is this medication? LORAZEPAM (lor A ze pam) treats anxiety. It works by Education administrator system slow down. It belongs to a group of medications called benzodiazepines. This medicine may be used for other purposes; ask your health care provider or pharmacist if you have questions. COMMON BRAND NAME(S): Ativan What should I tell my care team before I take this medication? They need to know if you have any of these conditions: Glaucoma Kidney disease Liver disease Lung or breathing disease, such as asthma or COPD Mental health condition Myasthenia gravis Sleep apnea Substance use disorder Suicidal thoughts, plans, or attempt by you or a family member An unusual or allergic reaction to lorazepam, other medications, foods, dyes, or preservatives Pregnant or trying to get pregnant Breast-feeding How should I use this medication? Take this medication by mouth with water. Take it as directed on the prescription label at the same time every day. Keep taking it unless your care team tells you to stop. A special MedGuide will be given to you by the pharmacist with each prescription and refill. Be sure to read this information carefully each time. Talk to your care team about the use of this medication in children. While this medication may be used in children as young as 12 years for selected conditions, precautions do apply. Overdosage: If you think you have taken too much of this medicine contact a poison control center or emergency room at once. NOTE: This medicine is only for you. Do not share this medicine with others. What if I miss a dose? If you miss a dose, take it as soon as you can. If it is almost time for your next dose, take only that dose. Do not take double or extra doses. What may interact with this medication? Do not take this medication with any of the following: Calcium, Magnesium, Potassium, Sodium oxybates Sodium oxybate This medication may also  interact with the following: Alcohol Certain antihistamines Certain medications for depression, such as amitriptyline or trazodone Certain medications for seizures, such as phenobarbital or primidone Divalproex sodium Medications that cause drowsiness before a procedure, such as propofol Medications that help you fall asleep Medications that relax muscles MAOIs, such as Marplan, Nardil, and Parnate Opioids for pain or cough Phenothiazines, such as chlorpromazine, prochlorperazine, thioridazine Probenecid Valproate Valproic acid This list may not describe all possible interactions. Give your health care provider a list of all the medicines, herbs, non-prescription drugs, or dietary supplements you use. Also tell them if you smoke, drink alcohol, or use illegal drugs. Some items may interact with your medicine. What should I watch for while using this medication? Visit your care team for regular checks on your progress. Tell your care team if your symptoms do not start to get better or if they get worse. This medication may affect your coordination, reaction time, or judgment. Do not drive or operate machinery until you know how this medication affects you. Sit up or stand slowly to reduce the risk of dizzy or fainting spells. Drinking alcohol with this medication can increase the risk of these side effects. If you take other medications that also cause drowsiness such as other opioid pain medications, benzodiazepines, or other medications for sleep, you may have more side effects. Give your care team a list of all medications you use. They will tell you how much medication to take. Do not take more medication than directed. Call emergency services if you have problems breathing or are unusually tired or sleepy.  If you or your family notice any changes in your behavior, such as new or worsening depression, thoughts of harming yourself, anxiety, other unusual or disturbing thoughts, or memory loss,  call your care team right away. This medication has a risk of abuse and dependence. Your care team will check you for this while you take this medication. Long term use of this medication may cause your brain and body to depend on it. This can happen even when used as directed by your care team. You and your care team will work together to determine how long you will need to take this medication. If your care team wants you to stop this medication, the dose will be slowly lowered over time to reduce the risk of side effects. Talk to your care team if you wish to become pregnant or think you might be pregnant. Talk to your care team before breastfeeding. Changes to your treatment plan may be needed. What side effects may I notice from receiving this medication? Side effects that you should report to your care team as soon as possible: Allergic reactions--skin rash, itching, hives, swelling of the face, lips, tongue, or throat CNS depression--slow or shallow breathing, shortness of breath, feeling faint, dizziness, confusion, difficulty staying awake Thoughts of suicide or self-harm, worsening mood, feelings of depression Side effects that usually do not require medical attention (report to your care team if they continue or are bothersome): Dizziness Drowsiness Headache Nausea Vomiting This list may not describe all possible side effects. Call your doctor for medical advice about side effects. You may report side effects to FDA at 1-800-FDA-1088. Where should I keep my medication? Keep out of the reach of children. This medication can be abused. Keep your medication in a safe place to protect it from theft. Do not share this medication with anyone. Selling or giving away this medication is dangerous and against the law. Store at room temperature between 20 and 25 degrees C (68 and 77 degrees F). Protect from light. Keep container tightly closed. This medication may cause accidental overdose and  death if taken by other adults, children, or pets. Mix any unused medication with a substance like cat litter or coffee grounds. Then throw the medication away in a sealed container like a sealed bag or a coffee can with a lid. Do not use the medication after the expiration date. NOTE: This sheet is a summary. It may not cover all possible information. If you have questions about this medicine, talk to your doctor, pharmacist, or health care provider.  2024 Elsevier/Gold Standard (2021-11-18 00:00:00)

## 2023-04-03 NOTE — Progress Notes (Unsigned)
BH MD OP Progress Note  04/03/2023 5:07 PM Ashley Collins  MRN:  [id number]  Chief Complaint:  Chief Complaint  Patient presents with   Follow-up   Anxiety   Depression   Medication Refill   HPI: Ashley Collins is a 62 Caucasian female currently on SSI, married, lives in [place], employed, has a history of GAD, other specified depression, insomnia, long-term current use of benzodiazepine, migraine headaches, kidney stones, presented for medication management and was evaluated in the office.  Patient today reports she continues to have anxiety symptoms mostly situational.  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  Patient reports that is a huge accomplishment for her when she believes work is a good distraction for her since it keeps her busy and out of her home.  Patient however agrees that with the promotion that has been more responsibilities which likely could be making her anxiety worse.  She reports she continues to struggle with her husband who has dementia.  There has been some paranoid accusations against her by her husband as well as some wandering.  She reports she and her children are keeping a close watch on her husband to make sure he does not get lost.  Patient reports her worsening anxiety has triggered her migraine headaches.  She does not believe the clonazepam 0.5 mg as beneficial anymore.  She recently was provided a dose of lorazepam by her daughter and that kind of helped her to feel better.  She wonders whether her clonazepam can be changed to lorazepam.  She did not tolerate the higher dosage of amitriptyline and hence is currently back on the 75 mg.  Patient reports she is currently on a lot of medications and does not want to keep changing her medications or change her amitriptyline or add another antidepressant at this time.  She is planning to follow up with her provider for her headache management to get some control.  Patient reports sleep is overall good.   She takes the Ambien and that helps.  Patient denies any substance use including cannabis or alcohol use.  Patient denies any suicidality, homicidality or perceptual disturbances.  Patient denies any other concerns today.  Visit Diagnosis:    ICD-10-CM   1. GAD (generalized anxiety disorder)  F41.1 LORazepam (ATIVAN) 0.5 MG tablet    Urine drugs of abuse scrn w alc, routine (Ref Lab)    2. Other specified depressive episodes  F32.89 LORazepam (ATIVAN) 0.5 MG tablet   depressive episodes with insufficient symptoms    3. Insomnia due to medical condition  G47.01    mood, headaches    4. Long-term current use of benzodiazepine  Z79.899     5. High risk medication use  Z79.899 Urine drugs of abuse scrn w alc, routine (Ref Lab)      Past Psychiatric History: I have reviewed past psychiatric history from progress note on 02/02/2022.  Past trials of medications Celexa, Xanax, Ambien.  Past Medical History:  Past Medical History:  Diagnosis Date   Actinic keratosis    Cancer (HCC)    basal cell   Complication of anesthesia    nausea and vomiting   Headache    History of kidney stones    Hypertension    Kidney stones    PONV (postoperative nausea and vomiting)     Past Surgical History:  Procedure Laterality Date   AUGMENTATION MAMMAPLASTY     CESAREAN SECTION     x 4  COLONOSCOPY WITH PROPOFOL N/A 03/31/2022   Procedure: COLONOSCOPY WITH PROPOFOL;  Surgeon: Jaynie Collins, DO;  [facility] ENDOSCOPY;  Service: Gastroenterology;  Laterality: N/A;   CYSTOSCOPY W/ RETROGRADES Bilateral 07/10/2020   Procedure: CYSTOSCOPY WITH RETROGRADE PYELOGRAM;  Surgeon: Sondra Come, MD;  Location: [facility] ORS;  Service: Urology;  Laterality: Bilateral;   CYSTOSCOPY/URETEROSCOPY/HOLMIUM LASER/STENT PLACEMENT     CYSTOSCOPY/URETEROSCOPY/HOLMIUM LASER/STENT PLACEMENT Bilateral 07/10/2020   Procedure: CYSTOSCOPY/URETEROSCOPY/HOLMIUM LASER/STENT PLACEMENT;  Surgeon: Sondra Come,  MD;  Location: [facility] ORS;  Service: Urology;  Laterality: Bilateral;   EXTRACORPOREAL SHOCK WAVE LITHOTRIPSY     x 10 plus   Eye Lift     FACIAL COSMETIC SURGERY     GANGLION CYST EXCISION Right 12/21/2021   Procedure: REMOVAL GANGLION OF WRIST;  Surgeon: Kennedy Bucker, MD;  Location: [facility] ORS;  Service: Orthopedics;  Laterality: Right;   LIPOSUCTION     PLACEMENT OF BREAST IMPLANTS     TONSILLECTOMY     TRIGGER FINGER RELEASE Right 12/21/2021   Procedure: RELEASE TRIGGER FINGER/A-1 PULLEY;  Surgeon: Kennedy Bucker, MD;  Location: [facility] ORS;  Service: Orthopedics;  Laterality: Right;   URETEROSCOPY WITH HOLMIUM LASER LITHOTRIPSY      Family Psychiatric History: I have reviewed family psychiatric history from progress note on 02/02/2022.  Family History:  Family History  Problem Relation Age of Onset   Drug abuse Daughter    Depression Daughter    Depression Son    Bladder Cancer Neg Hx    Kidney cancer Neg Hx    Prostate cancer Neg Hx     Social History: I have reviewed social history from progress note on 02/02/2022. Social History   Socioeconomic History   Marital status: Married    Spouse name: Ashley Collins   Number of children: 4   Years of education: Not on file   Highest education level: High school graduate  Occupational History   Not on file  Tobacco Use   Smoking status: Never    Passive exposure: Never   Smokeless tobacco: Never  Vaping Use   Vaping status: Never Used  Substance and Sexual Activity   Alcohol use: Never   Drug use: Never   Sexual activity: Yes  Other Topics Concern   Not on file  Social History Narrative   Not on file   Social Determinants of Health   Financial Resource Strain: Not on file  Food Insecurity: Not on file  Transportation Needs: Not on file  Physical Activity: Not on file  Stress: Not on file  Social Connections: Not on file    Allergies:  Allergies  Allergen Reactions   Percocet [Oxycodone-Acetaminophen] Itching     Metabolic Disorder Labs: No results found for: "HGBA1C", "MPG" No results found for: "PROLACTIN" No results found for: "CHOL", "TRIG", "HDL", "CHOLHDL", "VLDL", "LDLCALC" Lab Results  Component Value Date   TSH 2.743 03/08/2022    Therapeutic Level Labs: No results found for: "LITHIUM" No results found for: "VALPROATE" No results found for: "CBMZ"  Current Medications: Current Outpatient Medications  Medication Sig Dispense Refill   amitriptyline (ELAVIL) 75 MG tablet Take 1 tablet (75 mg total) by mouth at bedtime. Got it from the primary care     amLODipine (NORVASC) 5 MG tablet Take 2.5-5 mg by mouth as directed. Takes 7.5 mg daily     cyanocobalamin (VITAMIN B12) 1000 MCG/ML injection Inject into the muscle.     ketorolac (TORADOL) 10 MG tablet Take 10 mg by mouth.  KLOR-CON M20 20 MEQ tablet Take 20 mEq by mouth daily.     LORazepam (ATIVAN) 0.5 MG tablet Take 1 tablet (0.5 mg total) by mouth as directed. Take 1 tablet up to 3 to 4 times a week only for severe anxiety attacks, please limit use 21 tablet 0   losartan (COZAAR) 25 MG tablet Take 25 mg by mouth daily.     methocarbamol (ROBAXIN) 500 MG tablet Take 500 mg by mouth daily.     Na Sulfate-K Sulfate-Mg Sulf 17.5-3.13-1.6 GM/177ML SOLN Take by mouth.     naratriptan (AMERGE) 2.5 MG tablet Take by mouth.     ondansetron (ZOFRAN) 4 MG tablet Take 4 mg by mouth every 8 (eight) hours as needed.     promethazine (PHENERGAN) 25 MG tablet Take 25 mg by mouth.     tretinoin (RETIN-A) 0.025 % cream Apply topically at bedtime. Pea size amount to face nightly as tolerated 45 g 6   zolpidem (AMBIEN) 10 MG tablet Take 1 tablet (10 mg total) by mouth at bedtime as needed for sleep. 30 tablet 2   No current facility-administered medications for this visit.     Musculoskeletal: Strength & Muscle Tone: within normal limits Gait & Station: normal Patient leans: N/A  Psychiatric Specialty Exam: Review of Systems   Neurological:  Positive for headaches.  Psychiatric/Behavioral:  The patient is nervous/anxious.     Blood pressure 125/73, pulse 84, temperature 97.8 F (36.6 C), temperature source Skin, height 5\' 2"  (1.575 m), weight 118 lb 6.4 oz (53.7 kg).Body mass index is 21.66 kg/m.  General Appearance: Fairly Groomed  Eye Contact:  Fair  Speech:  Clear and Coherent  Volume:  Normal  Mood:  Anxious  Affect:  Appropriate  Thought Process:  Goal Directed and Descriptions of Associations: Intact  Orientation:  Full (Time, Place, and Person)  Thought Content: Logical   Suicidal Thoughts:  No  Homicidal Thoughts:  No  Memory:  Immediate;   Fair Recent;   Fair Remote;   Fair  Judgement:  Fair  Insight:  Good  Psychomotor Activity:  Normal  Concentration:  Concentration: Fair and Attention Span: Fair  Recall:  [employer] of Knowledge: Fair  Language: Fair  Akathisia:  No  Handed:  Right  AIMS (if indicated): not done  Assets:  Communication Skills Desire for Improvement Housing Social Support  ADL's:  Intact  Cognition: WNL  Sleep:  Fair   Screenings: GAD-7    Garment/textile technologist Visit from 07/25/2022 in [facility] Office Visit from 04/25/2022 in [facility] Office Visit from 03/14/2022 in [facility] Office Visit from 02/02/2022 in [facility]  Total GAD-7 Score 0 5 20 18       PHQ2-9    Flowsheet Row Office Visit from 07/25/2022 in [facility] Office Visit from 04/25/2022 in [facility] Office Visit from 02/02/2022 in [facility]  PHQ-2 Total Score 0 0 3  PHQ-9 Total Score 1 2 13       Flowsheet Row Video Visit from 01/19/2023 in [facility] Video Visit from  11/22/2022 in [facility] Office Visit from 07/25/2022 in [facility]  C-SSRS RISK CATEGORY No Risk No Risk No Risk        Assessment and Plan: Ashley Collins  is a 62 year old Caucasian female on SSI, married, lives in [place], has a history of GAD, depression, migraine headaches was evaluated in office today.  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  Plan GAD-unstable Continue amitriptyline 75 mg p.o. daily at night.  Patient reports she reduced the dosage since the 100 mg gave her side effects. Discontinue clonazepam for lack of benefit at 0.5 mg.  Patient would like to try lorazepam.  Provided education about the long-term risk of being on benzodiazepine therapy, patient to limit use. Start lorazepam 0.5 mg as needed 3-4 times a week only for severe anxiety. Reviewed [place] PMP AWARxE  Other specified depression-stable Continue amitriptyline as prescribed  Insomnia-stable Ambien 10 mg p.o. nightly as needed  Long-term current use of benzodiazepine-patient to continue to limit use.  Provided education.     Collaboration of Care: Collaboration of Care: Patient refused AEB patient declined referral for CBT.  Patient encouraged to follow up with her provider for her headaches.  Patient/Guardian was advised Release of Information must be obtained prior to any record release in order to collaborate their care with an outside provider. Patient/Guardian was advised if they have not already done so to contact the registration department to sign all necessary forms in order for [place] to release information regarding their care.   Consent: Patient/Guardian gives verbal consent for  treatment and assignment of benefits for services provided during this visit. Patient/Guardian expressed understanding and agreed to proceed.   Follow-up in clinic in 6 to 8 weeks or sooner if needed.  This note was generated in part or whole with voice recognition software. Voice recognition is usually quite accurate but there are transcription errors that can and very often do occur. I apologize for any typographical errors that were not detected and corrected.    Jomarie Longs, MD 04/03/2023, 5:07 PM

## 2023-04-05 ENCOUNTER — Telehealth: Payer: Self-pay | Admitting: Psychiatry

## 2023-04-05 ENCOUNTER — Other Ambulatory Visit
Admission: RE | Admit: 2023-04-05 | Discharge: 2023-04-05 | Disposition: A | Discharge status: Home / Self Care | Payer: 59 | Source: Ambulatory Visit | Attending: Psychiatry | Admitting: Psychiatry

## 2023-04-05 DIAGNOSIS — F411 Generalized anxiety disorder: Secondary | ICD-10-CM | POA: Insufficient documentation

## 2023-04-05 DIAGNOSIS — Z79899 Other long term (current) drug therapy: Secondary | ICD-10-CM | POA: Insufficient documentation

## 2023-04-05 NOTE — Telephone Encounter (Signed)
Noted  

## 2023-04-05 NOTE — Telephone Encounter (Signed)
Patient called stating she went to hospital to get urine test done. Results should be in chart

## 2023-04-06 ENCOUNTER — Ambulatory Visit: Payer: 59 | Admitting: Dermatology

## 2023-04-06 LAB — URINE DRUGS OF ABUSE SCREEN W ALC, ROUTINE (REF LAB)
Amphetamines, Urine: NEGATIVE ng/mL
Barbiturate, Ur: NEGATIVE ng/mL
Benzodiazepine Quant, Ur: NEGATIVE ng/mL
Cannabinoid Quant, Ur: NEGATIVE ng/mL
Cocaine (Metab.): NEGATIVE ng/mL
Ethanol U, Quan: NEGATIVE %
Methadone Screen, Urine: NEGATIVE ng/mL
Opiate Quant, Ur: NEGATIVE ng/mL
Phencyclidine, Ur: NEGATIVE ng/mL
Propoxyphene, Urine: NEGATIVE ng/mL

## 2023-04-10 DIAGNOSIS — N951 Menopausal and female climacteric states: Secondary | ICD-10-CM | POA: Insufficient documentation

## 2023-04-17 ENCOUNTER — Encounter (INDEPENDENT_AMBULATORY_CARE_PROVIDER_SITE_OTHER): Payer: Self-pay | Admitting: Nurse Practitioner

## 2023-04-17 ENCOUNTER — Ambulatory Visit (INDEPENDENT_AMBULATORY_CARE_PROVIDER_SITE_OTHER): Payer: 59 | Admitting: Nurse Practitioner

## 2023-04-17 VITALS — BP 140/83 | HR 74 | Resp 16 | Ht 62.0 in | Wt 116.4 lb

## 2023-04-17 DIAGNOSIS — M7989 Other specified soft tissue disorders: Secondary | ICD-10-CM

## 2023-04-18 ENCOUNTER — Encounter (INDEPENDENT_AMBULATORY_CARE_PROVIDER_SITE_OTHER): Payer: Self-pay | Admitting: Nurse Practitioner

## 2023-04-18 NOTE — Progress Notes (Signed)
Subjective:    Patient ID: Ashley Collins, female    DOB: 06/22/1961, 62 y.o.   MRN: [id number] Chief Complaint  Patient presents with   New Patient (Initial Visit)    Ref [facility] consult chronic venous insufficiency     The patient is a 62 year old female who presents today as a referral regarding lower extremity pain and swelling.  This has been ongoing for the last few months.  The patient has been wearing compression socks for the last couple of months.  She is not sure of the exact tightness but notes that they are very firm.  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  She notes that when she elevates her rest through the evening the swelling is gone in the morning but by the end of her shift in the evening both legs are very swollen and puffy and approaching towards her knee area.  I have viewed pictures on her phone and there is quite a drastic difference from her current presentation to what she looks like near the end of the day.  She also has notable varicosities bilaterally.  She notes that she does have a family history of some varicosities as well.  She also notes that her legs feel heavy and tired and aching over the end of the day.    Review of Systems  Cardiovascular:  Positive for leg swelling.  All other systems reviewed and are negative.      Objective:   Physical Exam Vitals reviewed.  HENT:     Head: Normocephalic.  Cardiovascular:     Rate and Rhythm: Normal rate.     Pulses: Normal pulses.  Pulmonary:     Effort: Pulmonary effort is normal.  Musculoskeletal:        General: Tenderness present.  Skin:    General: Skin is warm and dry.  Neurological:     Mental Status: She is alert and oriented to person, place, and time.  Psychiatric:        Mood and Affect: Mood normal.        Behavior: Behavior normal.        Thought Content: Thought content normal.        Judgment: Judgment normal.     BP (!) 140/83 (BP Location: Right Arm)   Pulse 74   Resp  16   Ht 5\' 2"  (1.575 m)   Wt 116 lb 6.4 oz (52.8 kg)   BMI 21.29 kg/m   Past Medical History:  Diagnosis Date   Actinic keratosis    Cancer (HCC)    basal cell   Complication of anesthesia    nausea and vomiting   Headache    History of kidney stones    Hypertension    Kidney stones    PONV (postoperative nausea and vomiting)     Social History   Socioeconomic History   Marital status: Married    Spouse name: james   Number of children: 4   Years of education: Not on file   Highest education level: High school graduate  Occupational History   Not on file  Tobacco Use   Smoking status: Never    Passive exposure: Never   Smokeless tobacco: Never  Vaping Use   Vaping status: Never Used  Substance and Sexual Activity   Alcohol use: Never   Drug use: Never   Sexual activity: Yes  Other Topics Concern   Not on file  Social History Narrative   Not  on file   Social Determinants of Health   Financial Resource Strain: Not on file  Food Insecurity: Not on file  Transportation Needs: Not on file  Physical Activity: Not on file  Stress: Not on file  Social Connections: Not on file  Intimate Partner Violence: Not on file    Past Surgical History:  Procedure Laterality Date   AUGMENTATION MAMMAPLASTY     CESAREAN SECTION     x 4   COLONOSCOPY WITH PROPOFOL N/A 03/31/2022   Procedure: COLONOSCOPY WITH PROPOFOL;  Surgeon: Jaynie Collins, DO;  Location: [facility] ENDOSCOPY;  Service: Gastroenterology;  Laterality: N/A;   CYSTOSCOPY W/ RETROGRADES Bilateral 07/10/2020   Procedure: CYSTOSCOPY WITH RETROGRADE PYELOGRAM;  Surgeon: Sondra Come, MD;  Location: [facility] ORS;  Service: Urology;  Laterality: Bilateral;   CYSTOSCOPY/URETEROSCOPY/HOLMIUM LASER/STENT PLACEMENT     CYSTOSCOPY/URETEROSCOPY/HOLMIUM LASER/STENT PLACEMENT Bilateral 07/10/2020   Procedure: CYSTOSCOPY/URETEROSCOPY/HOLMIUM LASER/STENT PLACEMENT;  Surgeon: Sondra Come, MD;  Location: [facility] ORS;   Service: Urology;  Laterality: Bilateral;   EXTRACORPOREAL SHOCK WAVE LITHOTRIPSY     x 10 plus   Eye Lift     FACIAL COSMETIC SURGERY     GANGLION CYST EXCISION Right 12/21/2021   Procedure: REMOVAL GANGLION OF WRIST;  Surgeon: Kennedy Bucker, MD;  Location: [facility] ORS;  Service: Orthopedics;  Laterality: Right;   LIPOSUCTION     PLACEMENT OF BREAST IMPLANTS     TONSILLECTOMY     TRIGGER FINGER RELEASE Right 12/21/2021   Procedure: RELEASE TRIGGER FINGER/A-1 PULLEY;  Surgeon: Kennedy Bucker, MD;  Location: [facility] ORS;  Service: Orthopedics;  Laterality: Right;   URETEROSCOPY WITH HOLMIUM LASER LITHOTRIPSY      Family History  Problem Relation Age of Onset   Drug abuse Daughter    Depression Daughter    Depression Son    Bladder Cancer Neg Hx    Kidney cancer Neg Hx    Prostate cancer Neg Hx     Allergies  Allergen Reactions   Percocet [Oxycodone-Acetaminophen] Itching       Latest Ref Rng & Units 03/08/2022    4:12 PM 12/21/2021    2:00 PM 12/15/2021    3:55 PM  CBC  WBC 4.0 - 10.5 K/uL 13.7   10.5   Hemoglobin 12.0 - 15.0 g/dL 16.1  09.6  04.5   Hematocrit 36.0 - 46.0 % 41.5  39.0  43.7   Platelets 150 - 400 K/uL 339   342       CMP     Component Value Date/Time   NA 142 03/08/2022 1612   K 3.7 03/08/2022 1612   CL 104 03/08/2022 1612   CO2 26 03/08/2022 1612   GLUCOSE 116 (H) 03/08/2022 1612   BUN 19 03/08/2022 1612   CREATININE 0.89 03/08/2022 1612   CALCIUM 10.4 (H) 03/08/2022 1612   PROT 7.3 03/08/2022 1612   ALBUMIN 4.8 03/08/2022 1612   AST 23 03/08/2022 1612   ALT 15 03/08/2022 1612   ALKPHOS 71 03/08/2022 1612   BILITOT 1.4 (H) 03/08/2022 1612   GFRNONAA >60 03/08/2022 1612     No results found.     Assessment & Plan:   1. Leg swelling The patient will also be undergoing evaluation for possible cardiac issues.  In the absence of that I do suspect she has a component of venous insufficiency based on her symptoms and her description.  In order to  confirm the patient will return with a bilateral venous reflux studies.  She is  advised to continue with conservative therapy including use of medical grade compression, elevation and activity.   Current Outpatient Medications on File Prior to Visit  Medication Sig Dispense Refill   amitriptyline (ELAVIL) 75 MG tablet Take 1 tablet (75 mg total) by mouth at bedtime. Got it from the primary care     amLODipine (NORVASC) 5 MG tablet Take 2.5-5 mg by mouth as directed. Takes 7.5 mg daily     cyanocobalamin (VITAMIN B12) 1000 MCG/ML injection Inject into the muscle.     ketorolac (TORADOL) 10 MG tablet Take 10 mg by mouth.     KLOR-CON M20 20 MEQ tablet Take 20 mEq by mouth daily.     LORazepam (ATIVAN) 0.5 MG tablet Take 1 tablet (0.5 mg total) by mouth as directed. Take 1 tablet up to 3 to 4 times a week only for severe anxiety attacks, please limit use 21 tablet 0   losartan (COZAAR) 25 MG tablet Take 25 mg by mouth daily.     methocarbamol (ROBAXIN) 500 MG tablet Take 500 mg by mouth daily.     Na Sulfate-K Sulfate-Mg Sulf 17.5-3.13-1.6 GM/177ML SOLN Take by mouth.     naratriptan (AMERGE) 2.5 MG tablet Take by mouth.     ondansetron (ZOFRAN) 4 MG tablet Take 4 mg by mouth every 8 (eight) hours as needed.     promethazine (PHENERGAN) 25 MG tablet Take 25 mg by mouth.     tretinoin (RETIN-A) 0.025 % cream Apply topically at bedtime. Pea size amount to face nightly as tolerated 45 g 6   zolpidem (AMBIEN) 10 MG tablet Take 1 tablet (10 mg total) by mouth at bedtime as needed for sleep. 30 tablet 2   No current facility-administered medications on file prior to visit.    There are no Patient Instructions on file for this visit. No follow-ups on file.   Georgiana Spinner, NP

## 2023-04-24 ENCOUNTER — Ambulatory Visit: Payer: 59 | Admitting: Psychiatry

## 2023-04-30 ENCOUNTER — Other Ambulatory Visit: Payer: Self-pay | Admitting: Psychiatry

## 2023-04-30 DIAGNOSIS — F3289 Other specified depressive episodes: Secondary | ICD-10-CM

## 2023-04-30 DIAGNOSIS — F411 Generalized anxiety disorder: Secondary | ICD-10-CM

## 2023-04-30 DIAGNOSIS — G4701 Insomnia due to medical condition: Secondary | ICD-10-CM

## 2023-05-02 ENCOUNTER — Telehealth: Payer: Self-pay

## 2023-05-02 NOTE — Telephone Encounter (Signed)
pt called states she needs refills on 2 medications the ambien and the lorazepam. she states that she only got 21 pills last time and she needs 26 pills. pt was last seen on 10-7 next appt 12-6

## 2023-05-02 NOTE — Telephone Encounter (Signed)
I have sent prescription refills for lorazepam to pharmacy along with Ambien.

## 2023-05-03 NOTE — Telephone Encounter (Signed)
Pt.notified

## 2023-05-12 ENCOUNTER — Other Ambulatory Visit (INDEPENDENT_AMBULATORY_CARE_PROVIDER_SITE_OTHER): Payer: Self-pay | Admitting: Nurse Practitioner

## 2023-05-12 DIAGNOSIS — M7989 Other specified soft tissue disorders: Secondary | ICD-10-CM

## 2023-05-22 ENCOUNTER — Ambulatory Visit (INDEPENDENT_AMBULATORY_CARE_PROVIDER_SITE_OTHER): Payer: 59

## 2023-05-22 ENCOUNTER — Ambulatory Visit (INDEPENDENT_AMBULATORY_CARE_PROVIDER_SITE_OTHER): Payer: 59 | Admitting: Vascular Surgery

## 2023-05-22 ENCOUNTER — Encounter (INDEPENDENT_AMBULATORY_CARE_PROVIDER_SITE_OTHER): Payer: Self-pay | Admitting: Vascular Surgery

## 2023-05-22 VITALS — BP 122/85 | HR 79 | Resp 18 | Ht 62.0 in | Wt 115.2 lb

## 2023-05-22 DIAGNOSIS — M7989 Other specified soft tissue disorders: Secondary | ICD-10-CM | POA: Diagnosis not present

## 2023-05-22 DIAGNOSIS — I499 Cardiac arrhythmia, unspecified: Secondary | ICD-10-CM | POA: Diagnosis not present

## 2023-05-22 DIAGNOSIS — I890 Lymphedema, not elsewhere classified: Secondary | ICD-10-CM | POA: Diagnosis not present

## 2023-05-22 DIAGNOSIS — M51379 Other intervertebral disc degeneration, lumbosacral region without mention of lumbar back pain or lower extremity pain: Secondary | ICD-10-CM | POA: Diagnosis not present

## 2023-05-22 DIAGNOSIS — I872 Venous insufficiency (chronic) (peripheral): Secondary | ICD-10-CM | POA: Diagnosis not present

## 2023-05-22 NOTE — Progress Notes (Signed)
MRN : [id number]  Ashley Collins is a 62 y.o. ([birth year]) female who presents with chief complaint of legs swell.  History of Present Illness:   The patient returns to the office for followup evaluation regarding leg swelling.  She is primarily concerned regarding the spider veins and reticular veins of the ankle there have not been any interval development of a ulcerations or wounds.  Since the previous visit the patient has been wearing graduated compression stockings and has noted improvement in the leg symptoms. The patient has been using compression routinely morning until night.  The patient also states elevation during the day and exercise is being done too.  Duplex ultrasound of the venous system demonstrates normal deep venous system.  There is no evidence of superficial reflux of either lower extremity.    Past Medical History:  Diagnosis Date   Actinic keratosis    Cancer (HCC)    basal cell   Complication of anesthesia    nausea and vomiting   Headache    History of kidney stones    Hypertension    Kidney stones    PONV (postoperative nausea and vomiting)     Past Surgical History:  Procedure Laterality Date   AUGMENTATION MAMMAPLASTY     CESAREAN SECTION     x 4   COLONOSCOPY WITH PROPOFOL N/A 03/31/2022   Procedure: COLONOSCOPY WITH PROPOFOL;  Surgeon: Jaynie Collins, DO;  Location: [facility] ENDOSCOPY;  Service: Gastroenterology;  Laterality: N/A;   CYSTOSCOPY W/ RETROGRADES Bilateral 07/10/2020   Procedure: CYSTOSCOPY WITH RETROGRADE PYELOGRAM;  Surgeon: Sondra Come, MD;  Location: [facility] ORS;  Service: Urology;  Laterality: Bilateral;   CYSTOSCOPY/URETEROSCOPY/HOLMIUM LASER/STENT PLACEMENT     CYSTOSCOPY/URETEROSCOPY/HOLMIUM LASER/STENT PLACEMENT Bilateral 07/10/2020   Procedure: CYSTOSCOPY/URETEROSCOPY/HOLMIUM LASER/STENT PLACEMENT;  Surgeon: Sondra Come, MD;  Location: [facility] ORS;  Service: Urology;  Laterality: Bilateral;    EXTRACORPOREAL SHOCK WAVE LITHOTRIPSY     x 10 plus   Eye Lift     FACIAL COSMETIC SURGERY     GANGLION CYST EXCISION Right 12/21/2021   Procedure: REMOVAL GANGLION OF WRIST;  Surgeon: Kennedy Bucker, MD;  Location: [facility] ORS;  Service: Orthopedics;  Laterality: Right;   LIPOSUCTION     PLACEMENT OF BREAST IMPLANTS     TONSILLECTOMY     TRIGGER FINGER RELEASE Right 12/21/2021   Procedure: RELEASE TRIGGER FINGER/A-1 PULLEY;  Surgeon: Kennedy Bucker, MD;  Location: [facility] ORS;  Service: Orthopedics;  Laterality: Right;   URETEROSCOPY WITH HOLMIUM LASER LITHOTRIPSY      Social History Social History   Tobacco Use   Smoking status: Never    Passive exposure: Never   Smokeless tobacco: Never  Vaping Use   Vaping status: Never Used  Substance Use Topics   Alcohol use: Never   Drug use: Never    Family History Family History  Problem Relation Age of Onset   Drug abuse Daughter    Depression Daughter    Depression Son    Bladder Cancer Neg Hx    Kidney cancer Neg Hx    Prostate cancer Neg Hx     Allergies  Allergen Reactions   Percocet [Oxycodone-Acetaminophen] Itching     REVIEW OF SYSTEMS (Negative unless checked)  Constitutional: [] Weight loss  [] Fever  [] Chills Cardiac: [] Chest pain   [] Chest pressure   [] Palpitations   [] Shortness of breath when laying flat   [] Shortness of breath with exertion. Vascular:  [] Pain in legs with walking   [x] Pain in legs  with standing  [] History of DVT   [] Phlebitis   [x] Swelling in legs   [] Varicose veins   [] Non-healing ulcers Pulmonary:   [] Uses home oxygen   [] Productive cough   [] Hemoptysis   [] Wheeze  [] COPD   [] Asthma Neurologic:  [] Dizziness   [] Seizures   [] History of stroke   [] History of TIA  [] Aphasia   [] Vissual changes   [] Weakness or numbness in arm   [] Weakness or numbness in leg Musculoskeletal:   [] Joint swelling   [] Joint pain   [x] Low back pain Hematologic:  [] Easy bruising  [] Easy bleeding   [] Hypercoagulable state    [] Anemic Gastrointestinal:  [] Diarrhea   [] Vomiting  [] Gastroesophageal reflux/heartburn   [] Difficulty swallowing. Genitourinary:  [] Chronic kidney disease   [] Difficult urination  [] Frequent urination   [] Blood in urine Skin:  [] Rashes   [] Ulcers  Psychological:  [x] History of anxiety   []  History of major depression.  Physical Examination  There were no vitals filed for this visit. There is no height or weight on file to calculate BMI. Gen: WD/WN, NAD Head: [place]/AT, No temporalis wasting.  Ear/Nose/Throat: Hearing grossly intact, nares w/o erythema or drainage, pinna without lesions Eyes: PER, EOMI, sclera nonicteric.  Neck: Supple, no gross masses.  No JVD.  Pulmonary:  Good air movement, no audible wheezing, no use of accessory muscles.  Cardiac: RRR, precordium not hyperdynamic. Vascular:  scattered varicosities present bilaterally.  Mild venous stasis changes to the legs bilaterally.  Trace soft pitting edema, CEAP [id number]  Vessel Right Left  Radial Palpable Palpable  Gastrointestinal: soft, non-distended. No guarding/no peritoneal signs.  Musculoskeletal: M/S 5/5 throughout.  No deformity.  Neurologic: CN 2-12 intact. Pain and light touch intact in extremities.  Symmetrical.  Speech is fluent. Motor exam as listed above. Psychiatric: Judgment intact, Mood & affect appropriate for pt's clinical situation. Dermatologic: Venous rashes no ulcers noted.  No changes consistent with cellulitis. Lymph : No lichenification or skin changes of chronic lymphedema.  CBC Lab Results  Component Value Date   WBC 13.7 (H) 03/08/2022   HGB 13.6 03/08/2022   HCT 41.5 03/08/2022   MCV 88.5 03/08/2022   PLT 339 03/08/2022    BMET    Component Value Date/Time   NA 142 03/08/2022 1612   K 3.7 03/08/2022 1612   CL 104 03/08/2022 1612   CO2 26 03/08/2022 1612   GLUCOSE 116 (H) 03/08/2022 1612   BUN 19 03/08/2022 1612   CREATININE 0.89 03/08/2022 1612   CALCIUM 10.4 (H) 03/08/2022 1612    GFRNONAA >60 03/08/2022 1612   CrCl cannot be calculated (Patient's most recent lab result is older than the maximum 21 days allowed.).  COAG No results found for: "INR", "PROTIME"  Radiology No results found.   Assessment/Plan 1. Lymphedema Recommend:  The patient has diffuse reticular and spider veins bilaterally.  Patient should undergo injection sclerotherapy to treat the spider veins.  The risks, benefits and alternative therapies were reviewed in detail with the patient.  All questions were answered.  The patient agrees to proceed with sclerotherapy at their convenience.  The patient will continue wearing the graduated compression stockings and using the over-the-counter pain medications to treat her symptoms.   2. Chronic venous insufficiency Recommend:  The patient has diffuse reticular and spider veins bilaterally.  Patient should undergo injection sclerotherapy to treat the spider veins.  The risks, benefits and alternative therapies were reviewed in detail with the patient.  All questions were answered.  The patient agrees to proceed  with sclerotherapy at their convenience.  The patient will continue wearing the graduated compression stockings and using the over-the-counter pain medications to treat her symptoms.    3. Cardiac arrhythmia, unspecified cardiac arrhythmia type Continue antiarrhythmia medications as already ordered, these medications have been reviewed and there are no changes at this time.  4. Degeneration of intervertebral disc of lumbosacral region, unspecified whether pain present Continue medications to treat the patient's degenerative disease as already ordered, these medications have been reviewed and there are no changes at this time.  Continued activity and therapy was stressed.    Levora Dredge, MD  05/22/2023 8:38 AM

## 2023-06-02 ENCOUNTER — Encounter: Payer: Self-pay | Admitting: Psychiatry

## 2023-06-02 ENCOUNTER — Telehealth (INDEPENDENT_AMBULATORY_CARE_PROVIDER_SITE_OTHER): Payer: 59 | Admitting: Psychiatry

## 2023-06-02 DIAGNOSIS — F411 Generalized anxiety disorder: Secondary | ICD-10-CM | POA: Diagnosis not present

## 2023-06-02 DIAGNOSIS — G4701 Insomnia due to medical condition: Secondary | ICD-10-CM | POA: Diagnosis not present

## 2023-06-02 DIAGNOSIS — F3289 Other specified depressive episodes: Secondary | ICD-10-CM | POA: Diagnosis not present

## 2023-06-02 DIAGNOSIS — Z79899 Other long term (current) drug therapy: Secondary | ICD-10-CM | POA: Diagnosis not present

## 2023-06-02 NOTE — Progress Notes (Signed)
Virtual Visit via Video Note  I connected with Ashley Collins on 06/02/23 at 11:30 AM EST by a video enabled telemedicine application and verified that I am speaking with the correct person using two identifiers.  Location Provider Location : [facility] Patient Location : Home  Participants: Patient , Provider   I discussed the limitations of evaluation and management by telemedicine and the availability of in person appointments. The patient expressed understanding and agreed to proceed.   I discussed the assessment and treatment plan with the patient. The patient was provided an opportunity to ask questions and all were answered. The patient agreed with the plan and demonstrated an understanding of the instructions.   The patient was advised to call back or seek an in-person evaluation if the symptoms worsen or if the condition fails to improve as anticipated.   BH MD OP Progress Note  06/02/2023 12:49 PM Ashley Collins  MRN:  [id number]  Chief Complaint:  Chief Complaint  Patient presents with   Follow-up   Anxiety   Depression   Medication Refill   HPI: Ashley Collins is a 62 year old Caucasian female, currently on SSD, married, lives in [place], employed, has a history of GAD, other specified depression, insomnia, long-term current use of benzodiazepines, migraine headaches, kidney stones, presents for medication management by telemedicine today.  The patient with a history of anxiety, depression ,insomnia, and migraines, reports a stable course on her current regimen of amitriptyline 75mg  at night, lorazepam as needed, and Ambien for insomnia. She has discontinued the use of Buspar. She reports no new changes to her medications or health.  The patient's home environment has recently become more stressful due to an adult child moving back in due to financial difficulties and a broken ankle. This has added two large dogs to a household that already includes several small dogs and cats.  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  The patient's migraines have been well-managed with Botox injections every three months at a migraine clinic in [place], in addition to the amitriptyline.   The patient's husband has dementia, but the patient reports learning to cope with the changes and not arguing with him as much. The patient's adult children are supportive and help out, including a daughter who is a Engineer, civil (consulting). The patient also attends a group for Alzheimer's, which she finds very helpful.  Currently denies any suicidality, homicidality or perceptual disturbances.  Patient denies any other concerns today.  Visit Diagnosis:    ICD-10-CM   1. GAD (generalized anxiety disorder)  F41.1     2. 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 Insomnia due to medical condition  G47.01    mood, headaches    4. Long-term current use of benzodiazepine  Z79.899       Past Psychiatric History: I have reviewed past psychiatric history from progress note on 02/02/2022.  Past trials of medications Celexa, Xanax, Ambien.  Past Medical History:  Past Medical History:  Diagnosis Date   Actinic keratosis    Cancer (HCC)    basal cell   Complication of anesthesia    nausea and vomiting   Headache    History of kidney stones    Hypertension    Kidney stones    PONV (postoperative nausea and vomiting)     Past Surgical History:  Procedure Laterality Date  AUGMENTATION MAMMAPLASTY     CESAREAN SECTION     x 4   COLONOSCOPY WITH PROPOFOL N/A 03/31/2022   Procedure: COLONOSCOPY WITH PROPOFOL;  Surgeon: Jaynie Collins, DO;  Location: [facility] ENDOSCOPY;  Service: Gastroenterology;  Laterality: N/A;   CYSTOSCOPY W/ RETROGRADES Bilateral 07/10/2020   Procedure: CYSTOSCOPY WITH RETROGRADE PYELOGRAM;  Surgeon: Sondra Come, MD;  Location: [facility]  ORS;  Service: Urology;  Laterality: Bilateral;   CYSTOSCOPY/URETEROSCOPY/HOLMIUM LASER/STENT PLACEMENT     CYSTOSCOPY/URETEROSCOPY/HOLMIUM LASER/STENT PLACEMENT Bilateral 07/10/2020   Procedure: CYSTOSCOPY/URETEROSCOPY/HOLMIUM LASER/STENT PLACEMENT;  Surgeon: Sondra Come, MD;  Location: [facility] ORS;  Service: Urology;  Laterality: Bilateral;   EXTRACORPOREAL SHOCK WAVE LITHOTRIPSY     x 10 plus   Eye Lift     FACIAL COSMETIC SURGERY     GANGLION CYST EXCISION Right 12/21/2021   Procedure: REMOVAL GANGLION OF WRIST;  Surgeon: Kennedy Bucker, MD;  Location: [facility] ORS;  Service: Orthopedics;  Laterality: Right;   LIPOSUCTION     PLACEMENT OF BREAST IMPLANTS     TONSILLECTOMY     TRIGGER FINGER RELEASE Right 12/21/2021   Procedure: RELEASE TRIGGER FINGER/A-1 PULLEY;  Surgeon: Kennedy Bucker, MD;  Location: [facility] ORS;  Service: Orthopedics;  Laterality: Right;   URETEROSCOPY WITH HOLMIUM LASER LITHOTRIPSY      Family Psychiatric History: I have reviewed family psychiatric history from progress note on 02/02/2022.  Family History:  Family History  Problem Relation Age of Onset   Drug abuse Daughter    Depression Daughter    Depression Son    Bladder Cancer Neg Hx    Kidney cancer Neg Hx    Prostate cancer Neg Hx     Social History: I have reviewed social history from progress note on 02/02/2022. Social History   Socioeconomic History   Marital status: Married    Spouse name: james   Number of children: 4   Years of education: Not on file   Highest education level: High school graduate  Occupational History   Not on file  Tobacco Use   Smoking status: Never    Passive exposure: Never   Smokeless tobacco: Never  Vaping Use   Vaping status: Never Used  Substance and Sexual Activity   Alcohol use: Never   Drug use: Never   Sexual activity: Yes  Other Topics Concern   Not on file  Social History Narrative   Not on file   Social Determinants of Health   Financial Resource  Strain: Low Risk  (05/11/2023)   Received from [facility]   Overall Financial Resource Strain (CARDIA)    Difficulty of Paying Living Expenses: Not hard at all  Food Insecurity: No Food Insecurity (05/11/2023)   [facility]   Hunger Vital Sign    Worried About Running Out of Food in the Last Year: Never true    Ran Out of Food in the Last Year: Never true  Transportation Needs: No Transportation Needs (05/11/2023)   Received from [facility]    In the past 12 months, has lack of transportation kept you from medical appointments or from getting medications?: No    Lack of Transportation (Non-Medical): No  Physical Activity: Not on file  Stress: Not on file  Social Connections: Not on file    Allergies:  Allergies  Allergen Reactions   Percocet [Oxycodone-Acetaminophen] Itching    Metabolic Disorder Labs: No results found for: "HGBA1C", "MPG" No  results found for: "PROLACTIN" No results found for: "CHOL", "TRIG", "HDL", "CHOLHDL", "VLDL", "LDLCALC" Lab Results  Component Value Date   TSH 2.743 03/08/2022    Therapeutic Level Labs: No results found for: "LITHIUM" No results found for: "VALPROATE" No results found for: "CBMZ"  Current Medications: Current Outpatient Medications  Medication Sig Dispense Refill   amitriptyline (ELAVIL) 75 MG tablet Take 1 tablet (75 mg total) by mouth at bedtime. Got it from the primary care     amLODipine (NORVASC) 5 MG tablet Take 2.5-5 mg by mouth as directed. Takes 7.5 mg daily     cyanocobalamin (VITAMIN B12) 1000 MCG/ML injection Inject into the muscle.     ketorolac (TORADOL) 10 MG tablet Take 10 mg by mouth.     LORazepam (ATIVAN) 0.5 MG tablet TAKE 1 TABLET BY MOUTH UP TO 3-4 TIMES A WEEK ONLY FOR SEVERE ANXIETY ATTACKS. PLEASE LIMIT USE. STOP TAKING CLONAZEPAM 26 tablet 2   losartan (COZAAR) 25 MG tablet Take 25 mg by mouth daily.      methocarbamol (ROBAXIN) 500 MG tablet Take 500 mg by mouth daily.     naratriptan (AMERGE) 2.5 MG tablet Take by mouth.     OnabotulinumtoxinA (BOTOX IM) Inject into the muscle.     ondansetron (ZOFRAN) 4 MG tablet Take 4 mg by mouth every 8 (eight) hours as needed.     promethazine (PHENERGAN) 25 MG tablet Take 25 mg by mouth.     zolpidem (AMBIEN) 10 MG tablet Take 1 tablet (10 mg total) by mouth at bedtime. 30 tablet 2   Na Sulfate-K Sulfate-Mg Sulf 17.5-3.13-1.6 GM/177ML SOLN Take by mouth. (Patient not taking: Reported on 06/02/2023)     No current facility-administered medications for this visit.     Musculoskeletal: Strength & Muscle Tone:  UTA Gait & Station:  Seated Patient leans: N/A  Psychiatric Specialty Exam: Review of Systems  Psychiatric/Behavioral: Negative.      There were no vitals taken for this visit.There is no height or weight on file to calculate BMI.  General Appearance: Fairly Groomed  Eye Contact:  Fair  Speech:  Clear and Coherent  Volume:  Normal  Mood:  Euthymic  Affect:  Full Range  Thought Process:  Goal Directed and Descriptions of Associations: Intact  Orientation:  Full (Time, Place, and Person)  Thought Content: Logical   Suicidal Thoughts:  No  Homicidal Thoughts:  No  Memory:  Immediate;   Fair Recent;   Fair Remote;   Fair  Judgement:  Fair  Insight:  Fair  Psychomotor Activity:  Normal  Concentration:  Concentration: Fair and Attention Span: Fair  Recall:  [employer] of Knowledge: Fair  Language: Fair  Akathisia:  No  Handed:  Right  AIMS (if indicated): not done  Assets:  Communication Skills Desire for Improvement Housing Social Support  ADL's:  Intact  Cognition: WNL  Sleep:  Fair   Screenings: GAD-7    Garment/textile technologist Visit from 04/03/2023 in [facility] Office Visit from 07/25/2022 in [facility] Office Visit from 04/25/2022 in [facility] Office Visit from 03/14/2022 in [facility] Office Visit from 02/02/2022 in [facility]  Total GAD-7 Score 18 0 5 20 18       PHQ2-9    Flowsheet Row Office Visit from 04/03/2023 in [facility] Office Visit from 07/25/2022 in  [facility] Office Visit from 04/25/2022 in [facility] Office Visit from 02/02/2022 in [facility]  PHQ-2 Total Score 0 0 0 3  PHQ-9 Total Score -- 1 2 13       Flowsheet Row Video Visit from 06/02/2023 in [facility] Office Visit from 04/03/2023 in [facility] Video Visit from 01/19/2023 in [facility]  C-SSRS RISK CATEGORY No Risk No Risk No Risk        Assessment and Plan: Jazline Gantt is a 62 year old Caucasian female on SSI, married, lives in [place], has a history of GAD, depression, migraine headaches was evaluated by telemedicine today.  Patient with improvement on the current medication regimen, discussed plan and assessment as noted below.  Generalized Anxiety Disorder/other depression- stable Manages anxiety with lorazepam as needed. No new side effects or concerns. Prefers lorazepam for acute episodes, reports effective management without overuse.   - Continue lorazepam 0.5 mg as needed 3-4 times a week only as needed  (UDS- 04/05/23-Negative) - Continue amitriptyline 75 mg p.o. nightly prescribed for headaches although it helps with anxiety and depression. - Follow-up in 3-4 months    Insomnia-stable Uses Ambien 10 mg at bedtime. No new side effects. Finds Ambien effective for sleep maintenance.   - Refill Ambien 10 mg as needed   - Reviewed [place] PMP  AWARxE   Migraine   Receives Botox injections every three months, effective in reducing frequency and severity. Uses ketorolac, naratriptan, and Zofran as needed for symptoms, reports significant relief.   - Continue Botox injections every three months   - Continue ketorolac, naratriptan, and Zofran as needed    Muscle Spasms   Uses methocarbamol 500 mg as needed. No new concerns. Finds methocarbamol effective for muscle relaxation.   - Continue methocarbamol 500 mg as needed    Hypertension   On amlodipine 5 mg and losartan 25 mg daily. Blood pressure well-controlled.   - Continue amlodipine 5 mg daily   - Continue losartan 25 mg daily    Vitamin B12 Deficiency   Receives Vitamin B12 injections. No new concerns. Adheres to injection schedule.   - Continue Vitamin B12 injections    Follow-up   - Schedule follow-up appointment for April 3rd at 1 PM via video.  Collaboration of Care: Collaboration of Care: Other patient encouraged to continue support groups.  Patient declined referral for CBT.  Patient/Guardian was advised Release of Information must be obtained prior to any record release in order to collaborate their care with an outside provider. Patient/Guardian was advised if they have not already done so to contact the registration department to sign all necessary forms in order for [place] to release information regarding their care.   Consent: Patient/Guardian gives verbal consent for treatment and assignment of benefits for services provided during this visit. Patient/Guardian expressed understanding and agreed to proceed.   This note was generated in part or whole with voice recognition software. Voice recognition is usually quite accurate but there are transcription errors that can and very often do occur. I apologize for any typographical errors that were not detected and corrected.    Jomarie Longs, MD 06/02/2023, 12:49 PM

## 2023-07-29 ENCOUNTER — Other Ambulatory Visit: Payer: Self-pay | Admitting: Psychiatry

## 2023-07-29 DIAGNOSIS — G4701 Insomnia due to medical condition: Secondary | ICD-10-CM

## 2023-07-30 ENCOUNTER — Other Ambulatory Visit: Payer: Self-pay | Admitting: Psychiatry

## 2023-07-30 DIAGNOSIS — F3289 Other specified depressive episodes: Secondary | ICD-10-CM

## 2023-07-30 DIAGNOSIS — F411 Generalized anxiety disorder: Secondary | ICD-10-CM

## 2023-08-02 ENCOUNTER — Other Ambulatory Visit: Payer: Self-pay

## 2023-08-02 ENCOUNTER — Emergency Department: Payer: 59

## 2023-08-02 DIAGNOSIS — I10 Essential (primary) hypertension: Secondary | ICD-10-CM | POA: Diagnosis not present

## 2023-08-02 DIAGNOSIS — Z79899 Other long term (current) drug therapy: Secondary | ICD-10-CM | POA: Insufficient documentation

## 2023-08-02 DIAGNOSIS — D72829 Elevated white blood cell count, unspecified: Secondary | ICD-10-CM | POA: Diagnosis not present

## 2023-08-02 DIAGNOSIS — N132 Hydronephrosis with renal and ureteral calculous obstruction: Secondary | ICD-10-CM | POA: Insufficient documentation

## 2023-08-02 DIAGNOSIS — R1031 Right lower quadrant pain: Secondary | ICD-10-CM | POA: Diagnosis present

## 2023-08-02 DIAGNOSIS — Z85828 Personal history of other malignant neoplasm of skin: Secondary | ICD-10-CM | POA: Insufficient documentation

## 2023-08-02 LAB — COMPREHENSIVE METABOLIC PANEL
ALT: 11 U/L (ref 0–44)
AST: 18 U/L (ref 15–41)
Albumin: 4.4 g/dL (ref 3.5–5.0)
Alkaline Phosphatase: 77 U/L (ref 38–126)
Anion gap: 9 (ref 5–15)
BUN: 19 mg/dL (ref 8–23)
CO2: 24 mmol/L (ref 22–32)
Calcium: 9.1 mg/dL (ref 8.9–10.3)
Chloride: 106 mmol/L (ref 98–111)
Creatinine, Ser: 0.78 mg/dL (ref 0.44–1.00)
GFR, Estimated: >60 mL/min (ref >60)
Glucose, Bld: 133 mg/dL — ABNORMAL HIGH (ref 70–99)
Potassium: 3.5 mmol/L (ref 3.5–5.1)
Sodium: 139 mmol/L (ref 135–145)
Total Bilirubin: 0.5 mg/dL (ref 0.0–1.2)
Total Protein: 7.1 g/dL (ref 6.5–8.1)

## 2023-08-02 LAB — CBC
HCT: 38.9 % (ref 36.0–46.0)
Hemoglobin: 12.8 g/dL (ref 12.0–15.0)
MCH: 30.4 pg (ref 26.0–34.0)
MCHC: 32.9 g/dL (ref 30.0–36.0)
MCV: 92.4 fL (ref 80.0–100.0)
Platelets: 346 10*3/uL (ref 150–400)
RBC: 4.21 MIL/uL (ref 3.87–5.11)
RDW: 12.3 % (ref 11.5–15.5)
WBC: 12.7 10*3/uL — ABNORMAL HIGH (ref 4.0–10.5)
nRBC: 0 % (ref 0.0–0.2)

## 2023-08-02 LAB — URINALYSIS, ROUTINE W REFLEX MICROSCOPIC
Bilirubin Urine: NEGATIVE
Glucose, UA: NEGATIVE mg/dL
Ketones, ur: NEGATIVE mg/dL
Leukocytes,Ua: NEGATIVE
Nitrite: NEGATIVE
Protein, ur: NEGATIVE mg/dL
RBC / HPF: >50 RBC/hpf (ref 0–5)
Specific Gravity, Urine: 1.009 (ref 1.005–1.030)
pH: 7 (ref 5.0–8.0)

## 2023-08-02 LAB — LIPASE, BLOOD: Lipase: 41 U/L (ref 11–51)

## 2023-08-02 MED ORDER — MORPHINE SULFATE (PF) 4 MG/ML IV SOLN
4.0000 mg | Freq: Once | INTRAVENOUS | Status: AC
  Administered 2023-08-02: 4 mg via INTRAVENOUS
  Filled 2023-08-02: qty 1

## 2023-08-02 NOTE — ED Provider Triage Note (Signed)
 Emergency Medicine Provider Triage Evaluation Note  Ashley Collins , a 63 y.o. female  was evaluated in triage.  Pt complains of right lower back pain that radiates to the right lower quadrant described as a band pain, pain is not related with movement,.  Patient has history of kidney stones  Review of Systems  Positive:  Negative:   Physical Exam  BP (!) 162/91   Pulse 100   Temp 98.5 F (36.9 C) (Oral)   Resp 18   SpO2 99%  Gen:   Awake, no distress   Resp:  Normal effort  MSK:   Moves extremities without difficulty  Other:    Medical Decision Making  Medically screening exam initiated at 9:03 PM.  Appropriate orders placed.  Ashley Collins was informed that the remainder of the evaluation will be completed by another provider, this initial triage assessment does not replace that evaluation, and the importance of remaining in the ED until their evaluation is complete.  Patient with right left lower pain that radiates to the back possible cholelithiasis, moderate UA, renal ultrasound.  Percocet   Nimesh Riolo, PA-C 08/02/23 2104

## 2023-08-02 NOTE — ED Triage Notes (Signed)
 Pt to ED via POV c/o right flank pain that radiates to right side of abdomen. Started about 2 hrs ago. Hx of kidney stones. Denies CP, SHOB, dizziness, fevers

## 2023-08-03 ENCOUNTER — Emergency Department
Admission: EM | Admit: 2023-08-03 | Discharge: 2023-08-03 | Disposition: A | Discharge status: Home / Self Care | Payer: 59 | Attending: Emergency Medicine | Admitting: Emergency Medicine

## 2023-08-03 DIAGNOSIS — N23 Unspecified renal colic: Secondary | ICD-10-CM

## 2023-08-03 DIAGNOSIS — R109 Unspecified abdominal pain: Secondary | ICD-10-CM

## 2023-08-03 MED ORDER — ONDANSETRON HCL 4 MG/2ML IJ SOLN
4.0000 mg | Freq: Once | INTRAMUSCULAR | Status: AC
  Administered 2023-08-03: 4 mg via INTRAVENOUS
  Filled 2023-08-03: qty 2

## 2023-08-03 MED ORDER — SODIUM CHLORIDE 0.9 % IV BOLUS
1000.0000 mL | Freq: Once | INTRAVENOUS | Status: AC
  Administered 2023-08-03: 1000 mL via INTRAVENOUS

## 2023-08-03 MED ORDER — KETOROLAC TROMETHAMINE 30 MG/ML IJ SOLN
15.0000 mg | Freq: Once | INTRAMUSCULAR | Status: AC
  Administered 2023-08-03: 15 mg via INTRAVENOUS
  Filled 2023-08-03: qty 1

## 2023-08-03 MED ORDER — TAMSULOSIN HCL 0.4 MG PO CAPS: 0.4000 mg | ORAL_CAPSULE | Freq: Every day | ORAL | 0 refills | Status: DC

## 2023-08-03 MED ORDER — OXYCODONE HCL 5 MG PO TABS
10.0000 mg | ORAL_TABLET | Freq: Once | ORAL | Status: AC
  Administered 2023-08-03: 10 mg via ORAL
  Filled 2023-08-03: qty 2

## 2023-08-03 MED ORDER — TAMSULOSIN HCL 0.4 MG PO CAPS
0.4000 mg | ORAL_CAPSULE | Freq: Once | ORAL | Status: AC
  Administered 2023-08-03: 0.4 mg via ORAL
  Filled 2023-08-03: qty 1

## 2023-08-03 MED ORDER — ONDANSETRON 4 MG PO TBDP: 4.0000 mg | ORAL_TABLET | Freq: Three times a day (TID) | ORAL | 0 refills | Status: DC | PRN

## 2023-08-03 MED ORDER — OXYCODONE HCL 5 MG PO TABS
5.0000 mg | ORAL_TABLET | Freq: Four times a day (QID) | ORAL | 0 refills | Status: DC | PRN
Start: 2023-08-03 — End: 2023-11-14

## 2023-08-03 NOTE — ED Provider Notes (Signed)
 [facility] Provider Note    Event Date/Time   First MD Initiated Contact with Patient 08/03/23 0153     (approximate)   History   Abdominal Pain   HPI  Ashley Collins is a 63 y.o. female who presents to the ED from home with a chief complaint of right flank/RLQ pain which began approximately 2 hours prior to arrival.  Longstanding history of kidney stones, history of stents and lithotripsy.  Denies associated fever/chills, chest pain, shortness of breath, nausea, vomiting or hematuria.     Past Medical History   Past Medical History:  Diagnosis Date   Actinic keratosis    Cancer (HCC)    basal cell   Complication of anesthesia    nausea and vomiting   Headache    History of kidney stones    Hypertension    Kidney stones    PONV (postoperative nausea and vomiting)      Active Problem List   Patient Active Problem List   Diagnosis Date Noted   Lymphedema 05/22/2023   Chronic venous insufficiency 05/22/2023   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 replacement therapy 04/10/2023   Insomnia due to medical condition 04/25/2022   GAD (generalized anxiety disorder) 02/02/2022   Other specified depressive episodes 02/02/2022   High risk medication use 02/02/2022   Migraine with aura and without status migrainosus, not intractable 11/03/2020   Arrhythmia 08/06/2020   Status migrainosus 02/25/2019   Osteoporosis 04/09/2013   Vitamin D deficiency 04/09/2013   DDD (degenerative disc disease), lumbosacral 02/08/2005     Past Surgical History   Past Surgical History:  Procedure Laterality Date   AUGMENTATION MAMMAPLASTY     CESAREAN SECTION     x 4   COLONOSCOPY WITH PROPOFOL  N/A 03/31/2022   Procedure: COLONOSCOPY WITH PROPOFOL ;  Surgeon: Onita Elspeth Sharper, DO;  Location: [facility] ENDOSCOPY;  Service: Gastroenterology;  Laterality: N/A;   CYSTOSCOPY W/ RETROGRADES Bilateral 07/10/2020   Procedure: CYSTOSCOPY WITH RETROGRADE PYELOGRAM;  Surgeon:  Francisca Redell BROCKS, MD;  Location: [facility] ORS;  Service: Urology;  Laterality: Bilateral;   CYSTOSCOPY/URETEROSCOPY/HOLMIUM LASER/STENT PLACEMENT     CYSTOSCOPY/URETEROSCOPY/HOLMIUM LASER/STENT PLACEMENT Bilateral 07/10/2020   Procedure: CYSTOSCOPY/URETEROSCOPY/HOLMIUM LASER/STENT PLACEMENT;  Surgeon: Francisca Redell BROCKS, MD;  Location: [facility] ORS;  Service: Urology;  Laterality: Bilateral;   EXTRACORPOREAL SHOCK WAVE LITHOTRIPSY     x 10 plus   Eye Lift     FACIAL COSMETIC SURGERY     GANGLION CYST EXCISION Right 12/21/2021   Procedure: REMOVAL GANGLION OF WRIST;  Surgeon: Kathlynn Sharper, MD;  Location: [facility] ORS;  Service: Orthopedics;  Laterality: Right;   LIPOSUCTION     PLACEMENT OF BREAST IMPLANTS     TONSILLECTOMY     TRIGGER FINGER RELEASE Right 12/21/2021   Procedure: RELEASE TRIGGER FINGER/A-1 PULLEY;  Surgeon: Kathlynn Sharper, MD;  Location: [facility] ORS;  Service: Orthopedics;  Laterality: Right;   URETEROSCOPY WITH HOLMIUM LASER LITHOTRIPSY       Home Medications   Prior to Admission medications   Medication Sig Start Date End Date Taking? Authorizing Provider  amitriptyline  (ELAVIL ) 75 MG tablet Take 1 tablet (75 mg total) by mouth at bedtime. Got it from the primary care 04/03/23   Eappen, Saramma, MD  amLODipine (NORVASC) 5 MG tablet Take 2.5-5 mg by mouth as directed. Takes 7.5 mg daily 03/17/21   [provider]  cyanocobalamin (VITAMIN B12) 1000 MCG/ML injection Inject into the muscle. 03/24/22   [provider]  ketorolac  (TORADOL ) 10 MG  tablet Take 10 mg by mouth. 01/16/23   [provider]  LORazepam  (ATIVAN ) 0.5 MG tablet Take 1 tablet (0.5 mg total) by mouth as directed. Take 1 tablet up to 3-4 times a week only for severe anxiety attacks,pls limit use 07/31/23 10/29/23  Eappen, Saramma, MD  losartan (COZAAR) 25 MG tablet Take 25 mg by mouth daily. 11/02/22   [provider]  methocarbamol (ROBAXIN) 500 MG tablet Take 500 mg by mouth daily. 10/26/22    [provider]  Na Sulfate-K Sulfate-Mg Sulf 17.5-3.13-1.6 GM/177ML SOLN Take by mouth. Patient not taking: Reported on 06/02/2023 03/28/22   [provider]  naratriptan (AMERGE) 2.5 MG tablet Take by mouth. 01/16/23 01/16/24  [provider]  OnabotulinumtoxinA (BOTOX IM) Inject into the muscle.    [provider]  ondansetron  (ZOFRAN ) 4 MG tablet Take 4 mg by mouth every 8 (eight) hours as needed. 11/09/22   [provider]  promethazine  (PHENERGAN ) 25 MG tablet Take 25 mg by mouth. 01/16/23   [provider]  zolpidem  (AMBIEN ) 10 MG tablet Take 1 tablet (10 mg total) by mouth at bedtime. 07/31/23 11/28/23  Eappen, Saramma, MD     Allergies  Percocet [oxycodone -acetaminophen ]   Family History   Family History  Problem Relation Age of Onset   Drug abuse Daughter    Depression Daughter    Depression Son    Bladder Cancer Neg Hx    Kidney cancer Neg Hx    Prostate cancer Neg Hx      Physical Exam  Triage Vital Signs: ED Triage Vitals  Encounter Vitals Group     BP 08/02/23 2059 (!) 162/91     Systolic BP Percentile --      Diastolic BP Percentile --      Pulse Rate 08/02/23 2059 100     Resp 08/02/23 2059 18     Temp 08/02/23 2059 98.5 F (36.9 C)     Temp Source 08/02/23 2059 Oral     SpO2 08/02/23 2059 99 %     Weight --      Height --      Head Circumference --      Peak Flow --      Pain Score 08/02/23 2100 10     Pain Loc --      Pain Education --      Exclude from Growth Chart --     Updated Vital Signs: BP (!) 157/85 (BP Location: Left Arm)   Pulse 85   Temp 98.4 F (36.9 C) (Oral)   Resp 18   SpO2 99%    General: Awake, mild distress.  CV:  RRR.  Good peripheral perfusion.  Resp:  Normal effort.  CTAB. Abd:  Mild tenderness to palpation right lower quadrant without rebound or guarding.  Mild right CVAT.  No distention.  Other:  No truncal vesicles.   ED Results / Procedures / Treatments   Labs (all labs ordered are listed, but only abnormal results are displayed) Labs Reviewed  COMPREHENSIVE METABOLIC PANEL - Abnormal; Notable for the following components:      Result Value   Glucose, Bld 133 (*)    All other components within normal limits  CBC - Abnormal; Notable for the following components:   WBC 12.7 (*)    All other components within normal limits  URINALYSIS, ROUTINE W REFLEX MICROSCOPIC - Abnormal; Notable for the following components:   Color, Urine STRAW (*)    APPearance HAZY (*)  Hgb urine dipstick LARGE (*)    Bacteria, UA MANY (*)    All other components within normal limits  LIPASE, BLOOD     EKG  None   RADIOLOGY I have independently visualized and interpreted patient's imaging study as well as noted the radiology interpretation:  CT renal stone study: 4 mm stone distal right ureter with moderate right hydronephrosis and hydroureter  Official radiology report(s): CT Renal Stone Study Result Date: 08/02/2023 CLINICAL DATA:  Flank pain EXAM: CT ABDOMEN AND PELVIS WITHOUT CONTRAST TECHNIQUE: Multidetector CT imaging of the abdomen and pelvis was performed following the standard protocol without IV contrast. RADIATION DOSE REDUCTION: This exam was performed according to the departmental dose-optimization program which includes automated exposure control, adjustment of the mA and/or kV according to patient size and/or use of iterative reconstruction technique. COMPARISON:  CT 07/09/2020 FINDINGS: Lower chest: Lung bases demonstrate breast prostheses. Nodular atelectasis or scarring at the right middle lobe. Hepatobiliary: No focal liver abnormality is seen. No gallstones, gallbladder wall thickening, or biliary dilatation. Pancreas: Unremarkable. No pancreatic ductal dilatation or surrounding inflammatory changes. Spleen: Normal in size without focal abnormality. Adrenals/Urinary Tract: Adrenal glands are normal. Small bilateral kidney stones. Moderate  right hydronephrosis and hydroureter, secondary to a 4 mm stone in the distal right ureter about 2 cm proximal to the right UVJ Stomach/Bowel: Stomach is within normal limits. Appendix appears normal. No evidence of bowel wall thickening, distention, or inflammatory changes. Vascular/Lymphatic: Moderate aortic atherosclerosis. No aneurysm. No suspicious lymph nodes Reproductive: Uterus and bilateral adnexa are unremarkable. Other: Negative for pelvic effusion or free air Musculoskeletal: No acute or suspicious osseous abnormality IMPRESSION: 1. Moderate right hydronephrosis and hydroureter, secondary to a 4 mm stone in the distal right ureter about 2 cm proximal to the right UVJ. 2. Small bilateral kidney stones. 3. Aortic atherosclerosis. Aortic Atherosclerosis (ICD10-I70.0). Electronically Signed   By: Luke Bun M.D.   On: 08/02/2023 23:38     PROCEDURES:  Critical Care performed: No  Procedures   MEDICATIONS ORDERED IN ED: Medications  tamsulosin  (FLOMAX ) capsule 0.4 mg (has no administration in time range)  morphine  (PF) 4 MG/ML injection 4 mg (4 mg Intravenous Given 08/02/23 2112)  sodium chloride  0.9 % bolus 1,000 mL (1,000 mLs Intravenous Bolus 08/03/23 0222)  ondansetron  (ZOFRAN ) injection 4 mg (4 mg Intravenous Given 08/03/23 0219)  ketorolac  (TORADOL ) 30 MG/ML injection 15 mg (15 mg Intravenous Given 08/03/23 0219)  oxyCODONE  (Oxy IR/ROXICODONE ) immediate release tablet 10 mg (10 mg Oral Given 08/03/23 0219)     IMPRESSION / MDM / ASSESSMENT AND PLAN / ED COURSE  I reviewed the triage vital signs and the nursing notes.                             63 year old female presenting with right flank/lower abdominal pain. Differential diagnosis includes, but is not limited to, ovarian cyst, ovarian torsion, acute appendicitis, diverticulitis, urinary tract infection/pyelonephritis, endometriosis, bowel obstruction, colitis, renal colic, gastroenteritis, hernia, etc. I personally reviewed  patient's records and note a neurology office visit on 07/31/2023 for chronic migraines.  Patient's presentation is most consistent with acute complicated illness / injury requiring diagnostic workup.  Laboratory results demonstrate mild leukocytosis with WBC 12.7, unremarkable electrolytes/renal function, negative UA for infection.  CT demonstrates ureteral stone.  Will administer IV fluids, ketorolac , Zofran , oral oxycodone  and Flomax .  Will reassess.  Clinical Course as of 08/03/23 0257  Thu Aug 03, 2023  0254 Pain improved.  Will discharge home on 2-week course of Flomax , as needed oxycodone /Zofran  and patient will follow-up with her urologist.  Strict return precautions given.  Patient and spouse verbalized understanding and agree with plan of care. [JS]    Clinical Course User Index [JS] Robinette Vermell PARAS, MD     FINAL CLINICAL IMPRESSION(S) / ED DIAGNOSES   Final diagnoses:  Ureteral colic  Right flank pain     Rx / DC Orders   ED Discharge Orders     None        Note:  This document was prepared using Dragon voice recognition software and may include unintentional dictation errors.   Cathlin Buchan J, MD 08/03/23 [phone]

## 2023-08-03 NOTE — Discharge Instructions (Signed)
 1. Take pain & nausea medicines as needed (Oxycodone /Zofran  #30). Make sure to take a stool softener while taking narcotic pain medicines. 2. Take Flomax  0.4mg  daily x 14 days. 3. Drink plenty of bottled or filtered water  daily. 4. Return to the ER for worsening symptoms, persistent vomiting, fever, difficulty breathing or other concerns.

## 2023-08-07 NOTE — Progress Notes (Signed)
08/08/2023 4:24 PM   Ashley Collins [birth year] [id number]  Referring provider: Alm Bustard, NP [address] [place],  [place] [address]  Urological history: 1.  Nephrolithiasis -Stone composition of calcium oxalate -Bilateral ureteroscopy (2022)  -24-hour urine persistent low urine volume and low urine citrate (2023)  -Had been on indapamide, but she discontinued it  Chief Complaint  Patient presents with   Nephrolithiasis   HPI: Ashley Collins is a 63 y.o. female who presents today for kidney stone.   Previous records reviewed.   She presented to the ED on August 03, 2023 with a complaint of right flank pain radiating to the right lower quadrant which had began 2 hours prior to her arrival.   CT renal stone study noted moderate right hydronephrosis and hydroureter secondary to a 4 mm stone in the distal right ureter about 2 cm proximal to the right UVJ.  She also had small bilateral kidney stones.   UA was hazy, specific gravity 1.009, pH 7.0, large heme, greater than 50 RBCs, 0-5 WBCs, many bacteria, 6-10 squames and mucus present.  CBC with a mild leukocytosis at 12.7.  Her serum creatinine was 0.78.  She was given Flomax, oxycodone and Zofran and instructed to follow-up with [place].  Today, she continues to have right-sided flank pain.  She does not believe she has passed a fragment.  Patient denies any modifying or aggravating factors.  Patient denies any recent UTI's, gross hematuria, dysuria or suprapubic.  Patient denies any fevers, chills, nausea or vomiting.    UA yellow slightly cloudy, specific gravity 1.020, trace heme, pH 6.0, 0-5 WBCs, 3-10 RBCs, greater than 10 epithelial cells and many bacteria  KUB Right ureteral stone not visible.   PMH: Past Medical History:  Diagnosis Date   Actinic keratosis    Cancer (HCC)    basal cell   Complication of anesthesia    nausea and vomiting   Headache    History of kidney stones    Hypertension    Kidney stones     PONV (postoperative nausea and vomiting)     Surgical History: Past Surgical History:  Procedure Laterality Date   AUGMENTATION MAMMAPLASTY     CESAREAN SECTION     x 4   COLONOSCOPY WITH PROPOFOL N/A 03/31/2022   Procedure: COLONOSCOPY WITH PROPOFOL;  Surgeon: Jaynie Collins, DO;  Location: [facility] ENDOSCOPY;  Service: Gastroenterology;  Laterality: N/A;   CYSTOSCOPY W/ RETROGRADES Bilateral 07/10/2020   Procedure: CYSTOSCOPY WITH RETROGRADE PYELOGRAM;  Surgeon: Sondra Come, MD;  Location: [facility] ORS;  Service: Urology;  Laterality: Bilateral;   CYSTOSCOPY/URETEROSCOPY/HOLMIUM LASER/STENT PLACEMENT     CYSTOSCOPY/URETEROSCOPY/HOLMIUM LASER/STENT PLACEMENT Bilateral 07/10/2020   Procedure: CYSTOSCOPY/URETEROSCOPY/HOLMIUM LASER/STENT PLACEMENT;  Surgeon: Sondra Come, MD;  Location: [facility] ORS;  Service: Urology;  Laterality: Bilateral;   EXTRACORPOREAL SHOCK WAVE LITHOTRIPSY     x 10 plus   Eye Lift     FACIAL COSMETIC SURGERY     GANGLION CYST EXCISION Right 12/21/2021   Procedure: REMOVAL GANGLION OF WRIST;  Surgeon: Kennedy Bucker, MD;  Location: [facility] ORS;  Service: Orthopedics;  Laterality: Right;   LIPOSUCTION     PLACEMENT OF BREAST IMPLANTS     TONSILLECTOMY     TRIGGER FINGER RELEASE Right 12/21/2021   Procedure: RELEASE TRIGGER FINGER/A-1 PULLEY;  Surgeon: Kennedy Bucker, MD;  Location: [facility] ORS;  Service: Orthopedics;  Laterality: Right;   URETEROSCOPY WITH HOLMIUM LASER LITHOTRIPSY      Home Medications:  Allergies as of 08/08/2023       Reactions   Percocet [oxycodone-acetaminophen] Itching        Medication List        Accurate as of August 08, 2023  4:24 PM. If you have any questions, ask your nurse or doctor.          amitriptyline 75 MG tablet Commonly known as: ELAVIL Take 1 tablet (75 mg total) by mouth at bedtime. Got it from the primary care   amLODipine 5 MG tablet Commonly known as: NORVASC Take 2.5-5 mg by mouth as directed. Takes  7.5 mg daily   BOTOX IM Inject into the muscle.   cyanocobalamin 1000 MCG/ML injection Commonly known as: VITAMIN B12 Inject into the muscle.   estradiol 1 MG tablet Commonly known as: ESTRACE Take 1 mg by mouth daily.   ketorolac 10 MG tablet Commonly known as: TORADOL Take 1 tablet (10 mg total) by mouth every 6 (six) hours. What changed: when to take this Changed by: Foy Mungia   LORazepam 0.5 MG tablet Commonly known as: ATIVAN Take 1 tablet (0.5 mg total) by mouth as directed. Take 1 tablet up to 3-4 times a week only for severe anxiety attacks,pls limit use   losartan 25 MG tablet Commonly known as: COZAAR Take 25 mg by mouth daily.   methocarbamol 500 MG tablet Commonly known as: ROBAXIN Take 500 mg by mouth daily.   Na Sulfate-K Sulfate-Mg Sulfate concentrate 17.5-3.13-1.6 GM/177ML Soln Take by mouth.   naratriptan 2.5 MG tablet Commonly known as: AMERGE Take by mouth.   ondansetron 4 MG disintegrating tablet Commonly known as: ZOFRAN-ODT Take 1 tablet (4 mg total) by mouth every 8 (eight) hours as needed for nausea or vomiting.   ondansetron 4 MG tablet Commonly known as: ZOFRAN Take 4 mg by mouth every 8 (eight) hours as needed.   oxyCODONE 5 MG immediate release tablet Commonly known as: Roxicodone Take 1 tablet (5 mg total) by mouth every 6 (six) hours as needed for severe pain (pain score 7-10).   progesterone 100 MG capsule Commonly known as: PROMETRIUM Take 100 mg by mouth at bedtime.   promethazine 25 MG tablet Commonly known as: PHENERGAN Take 25 mg by mouth.   tamsulosin 0.4 MG Caps capsule Commonly known as: Flomax Take 1 capsule (0.4 mg total) by mouth daily.   Vitamin D (Ergocalciferol) 1.25 MG (50000 UNIT) Caps capsule Commonly known as: DRISDOL Take 50,000 Units by mouth once a week.   zolpidem 10 MG tablet Commonly known as: AMBIEN Take 1 tablet (10 mg total) by mouth at bedtime.        Allergies:  Allergies   Allergen Reactions   Percocet [Oxycodone-Acetaminophen] Itching    Family History: Family History  Problem Relation Age of Onset   Drug abuse Daughter    Depression Daughter    Depression Son    Bladder Cancer Neg Hx    Kidney cancer Neg Hx    Prostate cancer Neg Hx     Social History:  reports that she has never smoked. She has never been exposed to tobacco smoke. She has never used smokeless tobacco. She reports that she does not drink alcohol and does not use drugs.  ROS: Pertinent ROS in HPI  Physical Exam: BP (!) 153/87   Pulse 90   Ht 5\' 2"  (1.575 m)   Wt 118 lb (53.5 kg)   BMI 21.58 kg/m   Constitutional:  Well nourished. Alert and oriented,  No acute distress. HEENT: [employer] AT, moist mucus membranes.  Trachea midline, no masses. Cardiovascular: No clubbing, cyanosis, or edema. Respiratory: Normal respiratory effort, no increased work of breathing. Neurologic: Grossly intact, no focal deficits, moving all 4 extremities. Psychiatric: Normal mood and affect.    Laboratory Data: Lab Results  Component Value Date   WBC 12.7 (H) 08/02/2023   HGB 12.8 08/02/2023   HCT 38.9 08/02/2023   MCV 92.4 08/02/2023   PLT 346 08/02/2023   Lab Results  Component Value Date   CREATININE 0.78 08/02/2023   Lab Results  Component Value Date   AST 18 08/02/2023   Lab Results  Component Value Date   ALT 11 08/02/2023  Urinalysis See EPIC and HPI  I have reviewed the labs.   Pertinent Imaging: Narrative & Impression  CLINICAL DATA:  Flank pain   EXAM: CT ABDOMEN AND PELVIS WITHOUT CONTRAST   TECHNIQUE: Multidetector CT imaging of the abdomen and pelvis was performed following the standard protocol without IV contrast.   RADIATION DOSE REDUCTION: This exam was performed according to the departmental dose-optimization program which includes automated exposure control, adjustment of the mA and/or kV according to patient size and/or use of iterative reconstruction  technique.   COMPARISON:  CT 07/09/2020   FINDINGS: Lower chest: Lung bases demonstrate breast prostheses. Nodular atelectasis or scarring at the right middle lobe.   Hepatobiliary: No focal liver abnormality is seen. No gallstones, gallbladder wall thickening, or biliary dilatation.   Pancreas: Unremarkable. No pancreatic ductal dilatation or surrounding inflammatory changes.   Spleen: Normal in size without focal abnormality.   Adrenals/Urinary Tract: Adrenal glands are normal. Small bilateral kidney stones. Moderate right hydronephrosis and hydroureter, secondary to a 4 mm stone in the distal right ureter about 2 cm proximal to the right UVJ   Stomach/Bowel: Stomach is within normal limits. Appendix appears normal. No evidence of bowel wall thickening, distention, or inflammatory changes.   Vascular/Lymphatic: Moderate aortic atherosclerosis. No aneurysm. No suspicious lymph nodes   Reproductive: Uterus and bilateral adnexa are unremarkable.   Other: Negative for pelvic effusion or free air   Musculoskeletal: No acute or suspicious osseous abnormality   IMPRESSION: 1. Moderate right hydronephrosis and hydroureter, secondary to a 4 mm stone in the distal right ureter about 2 cm proximal to the right UVJ. 2. Small bilateral kidney stones. 3. Aortic atherosclerosis.   Aortic Atherosclerosis (ICD10-I70.0).     Electronically Signed   By: Jasmine Pang M.D.   On: 08/02/2023 23:38    SD < 1500 HU,  actually closer to 500 HU  Stone not seen on today's KUB  I have independently reviewed the films.  See HPI.   Assessment & Plan:    1. Right ureteral stone -We discussed various treatment options for urolithiasis including observation with or without medical expulsive therapy, shockwave lithotripsy (SWL), ureteroscopy and laser lithotripsy with stent placement.  -We discussed that management is based on stone size, location, density, patient co-morbidities, and  patient preference.   -Stones <62mm in size have a >80% spontaneous passage rate. Data surrounding the use of tamsulosin for medical expulsive therapy is controversial, but meta analyses suggests it is most efficacious for distal stones between 5-41mm in size. Possible side effects include dizziness/lightheadedness -ESWL is not an option because stone is not readily visible on KUB -She has had ureteroscopy in the past and tolerated it well - schedule right ureteroscopy with laser lithotripsy and ureteral stent placement - explained to the patient how  the procedure is performed and the risks involved -informed the patient that they will have an ureteral stent, which will remain in place for approximately 3-10 days and can be associated with flank pain, bladder pain, dysuria, urgency, frequency, urinary leakage, and gross hematuria. - stent may be removed in the office with a cystoscope or patient may be instructed to remove the stent themselves by the string and that is decided on the day of the procedure - residual stones within the kidney or ureter may be present after the procedure and may need to have these addressed at a different encounter - injury to the ureter is the most common intra-operative risk, it may result in an open procedure to correct the defect - infection and bleeding are also risks - explained the risks of general anesthesia, such as: MI, CVA, paralysis, coma and/or death. - advised to contact our office or seek treatment in the ED if becomes febrile or pain/ vomiting are difficult control in order to arrange for emergent/urgent intervention -I refilled her Toradol and her tamsulosin   2.  Microscopic hematuria -UA w/ micro heme  -urine culture pending -Will reassess when she undergoes definitive treatment for her ureteral stone    Return for Right URS/LL/stent placement .  These notes generated with voice recognition software. I apologize for typographical  errors.  Cloretta Ned  [facility] [address]  [address] [place], [place] [address] [phone]

## 2023-08-07 NOTE — H&P (View-Only) (Signed)
 08/08/2023 4:24 PM   Ashley Collins [birth year] [id number]  Referring provider: Alm Bustard, NP [address] [place],  [place] [address]  Urological history: 1.  Nephrolithiasis -Stone composition of calcium oxalate -Bilateral ureteroscopy (2022)  -24-hour urine persistent low urine volume and low urine citrate (2023)  -Had been on indapamide, but she discontinued it  Chief Complaint  Patient presents with   Nephrolithiasis   HPI: Ashley Collins is a 63 y.o. female who presents today for kidney stone.   Previous records reviewed.   She presented to the ED on August 03, 2023 with a complaint of right flank pain radiating to the right lower quadrant which had began 2 hours prior to her arrival.   CT renal stone study noted moderate right hydronephrosis and hydroureter secondary to a 4 mm stone in the distal right ureter about 2 cm proximal to the right UVJ.  She also had small bilateral kidney stones.   UA was hazy, specific gravity 1.009, pH 7.0, large heme, greater than 50 RBCs, 0-5 WBCs, many bacteria, 6-10 squames and mucus present.  CBC with a mild leukocytosis at 12.7.  Her serum creatinine was 0.78.  She was given Flomax, oxycodone and Zofran and instructed to follow-up with [place].  Today, she continues to have right-sided flank pain.  She does not believe she has passed a fragment.  Patient denies any modifying or aggravating factors.  Patient denies any recent UTI's, gross hematuria, dysuria or suprapubic.  Patient denies any fevers, chills, nausea or vomiting.    UA yellow slightly cloudy, specific gravity 1.020, trace heme, pH 6.0, 0-5 WBCs, 3-10 RBCs, greater than 10 epithelial cells and many bacteria  KUB Right ureteral stone not visible.   PMH: Past Medical History:  Diagnosis Date   Actinic keratosis    Cancer (HCC)    basal cell   Complication of anesthesia    nausea and vomiting   Headache    History of kidney stones    Hypertension    Kidney stones     PONV (postoperative nausea and vomiting)     Surgical History: Past Surgical History:  Procedure Laterality Date   AUGMENTATION MAMMAPLASTY     CESAREAN SECTION     x 4   COLONOSCOPY WITH PROPOFOL N/A 03/31/2022   Procedure: COLONOSCOPY WITH PROPOFOL;  Surgeon: Jaynie Collins, DO;  Location: [facility] ENDOSCOPY;  Service: Gastroenterology;  Laterality: N/A;   CYSTOSCOPY W/ RETROGRADES Bilateral 07/10/2020   Procedure: CYSTOSCOPY WITH RETROGRADE PYELOGRAM;  Surgeon: Sondra Come, MD;  Location: [facility] ORS;  Service: Urology;  Laterality: Bilateral;   CYSTOSCOPY/URETEROSCOPY/HOLMIUM LASER/STENT PLACEMENT     CYSTOSCOPY/URETEROSCOPY/HOLMIUM LASER/STENT PLACEMENT Bilateral 07/10/2020   Procedure: CYSTOSCOPY/URETEROSCOPY/HOLMIUM LASER/STENT PLACEMENT;  Surgeon: Sondra Come, MD;  Location: [facility] ORS;  Service: Urology;  Laterality: Bilateral;   EXTRACORPOREAL SHOCK WAVE LITHOTRIPSY     x 10 plus   Eye Lift     FACIAL COSMETIC SURGERY     GANGLION CYST EXCISION Right 12/21/2021   Procedure: REMOVAL GANGLION OF WRIST;  Surgeon: Kennedy Bucker, MD;  Location: [facility] ORS;  Service: Orthopedics;  Laterality: Right;   LIPOSUCTION     PLACEMENT OF BREAST IMPLANTS     TONSILLECTOMY     TRIGGER FINGER RELEASE Right 12/21/2021   Procedure: RELEASE TRIGGER FINGER/A-1 PULLEY;  Surgeon: Kennedy Bucker, MD;  Location: [facility] ORS;  Service: Orthopedics;  Laterality: Right;   URETEROSCOPY WITH HOLMIUM LASER LITHOTRIPSY      Home Medications:  Allergies as of 08/08/2023       Reactions   Percocet [oxycodone-acetaminophen] Itching        Medication List        Accurate as of August 08, 2023  4:24 PM. If you have any questions, ask your nurse or doctor.          amitriptyline 75 MG tablet Commonly known as: ELAVIL Take 1 tablet (75 mg total) by mouth at bedtime. Got it from the primary care   amLODipine 5 MG tablet Commonly known as: NORVASC Take 2.5-5 mg by mouth as directed. Takes  7.5 mg daily   BOTOX IM Inject into the muscle.   cyanocobalamin 1000 MCG/ML injection Commonly known as: VITAMIN B12 Inject into the muscle.   estradiol 1 MG tablet Commonly known as: ESTRACE Take 1 mg by mouth daily.   ketorolac 10 MG tablet Commonly known as: TORADOL Take 1 tablet (10 mg total) by mouth every 6 (six) hours. What changed: when to take this Changed by: Foy Mungia   LORazepam 0.5 MG tablet Commonly known as: ATIVAN Take 1 tablet (0.5 mg total) by mouth as directed. Take 1 tablet up to 3-4 times a week only for severe anxiety attacks,pls limit use   losartan 25 MG tablet Commonly known as: COZAAR Take 25 mg by mouth daily.   methocarbamol 500 MG tablet Commonly known as: ROBAXIN Take 500 mg by mouth daily.   Na Sulfate-K Sulfate-Mg Sulfate concentrate 17.5-3.13-1.6 GM/177ML Soln Take by mouth.   naratriptan 2.5 MG tablet Commonly known as: AMERGE Take by mouth.   ondansetron 4 MG disintegrating tablet Commonly known as: ZOFRAN-ODT Take 1 tablet (4 mg total) by mouth every 8 (eight) hours as needed for nausea or vomiting.   ondansetron 4 MG tablet Commonly known as: ZOFRAN Take 4 mg by mouth every 8 (eight) hours as needed.   oxyCODONE 5 MG immediate release tablet Commonly known as: Roxicodone Take 1 tablet (5 mg total) by mouth every 6 (six) hours as needed for severe pain (pain score 7-10).   progesterone 100 MG capsule Commonly known as: PROMETRIUM Take 100 mg by mouth at bedtime.   promethazine 25 MG tablet Commonly known as: PHENERGAN Take 25 mg by mouth.   tamsulosin 0.4 MG Caps capsule Commonly known as: Flomax Take 1 capsule (0.4 mg total) by mouth daily.   Vitamin D (Ergocalciferol) 1.25 MG (50000 UNIT) Caps capsule Commonly known as: DRISDOL Take 50,000 Units by mouth once a week.   zolpidem 10 MG tablet Commonly known as: AMBIEN Take 1 tablet (10 mg total) by mouth at bedtime.        Allergies:  Allergies   Allergen Reactions   Percocet [Oxycodone-Acetaminophen] Itching    Family History: Family History  Problem Relation Age of Onset   Drug abuse Daughter    Depression Daughter    Depression Son    Bladder Cancer Neg Hx    Kidney cancer Neg Hx    Prostate cancer Neg Hx     Social History:  reports that she has never smoked. She has never been exposed to tobacco smoke. She has never used smokeless tobacco. She reports that she does not drink alcohol and does not use drugs.  ROS: Pertinent ROS in HPI  Physical Exam: BP (!) 153/87   Pulse 90   Ht 5\' 2"  (1.575 m)   Wt 118 lb (53.5 kg)   BMI 21.58 kg/m   Constitutional:  Well nourished. Alert and oriented,  No acute distress. HEENT: [employer] AT, moist mucus membranes.  Trachea midline, no masses. Cardiovascular: No clubbing, cyanosis, or edema. Respiratory: Normal respiratory effort, no increased work of breathing. Neurologic: Grossly intact, no focal deficits, moving all 4 extremities. Psychiatric: Normal mood and affect.    Laboratory Data: Lab Results  Component Value Date   WBC 12.7 (H) 08/02/2023   HGB 12.8 08/02/2023   HCT 38.9 08/02/2023   MCV 92.4 08/02/2023   PLT 346 08/02/2023   Lab Results  Component Value Date   CREATININE 0.78 08/02/2023   Lab Results  Component Value Date   AST 18 08/02/2023   Lab Results  Component Value Date   ALT 11 08/02/2023  Urinalysis See EPIC and HPI  I have reviewed the labs.   Pertinent Imaging: Narrative & Impression  CLINICAL DATA:  Flank pain   EXAM: CT ABDOMEN AND PELVIS WITHOUT CONTRAST   TECHNIQUE: Multidetector CT imaging of the abdomen and pelvis was performed following the standard protocol without IV contrast.   RADIATION DOSE REDUCTION: This exam was performed according to the departmental dose-optimization program which includes automated exposure control, adjustment of the mA and/or kV according to patient size and/or use of iterative reconstruction  technique.   COMPARISON:  CT 07/09/2020   FINDINGS: Lower chest: Lung bases demonstrate breast prostheses. Nodular atelectasis or scarring at the right middle lobe.   Hepatobiliary: No focal liver abnormality is seen. No gallstones, gallbladder wall thickening, or biliary dilatation.   Pancreas: Unremarkable. No pancreatic ductal dilatation or surrounding inflammatory changes.   Spleen: Normal in size without focal abnormality.   Adrenals/Urinary Tract: Adrenal glands are normal. Small bilateral kidney stones. Moderate right hydronephrosis and hydroureter, secondary to a 4 mm stone in the distal right ureter about 2 cm proximal to the right UVJ   Stomach/Bowel: Stomach is within normal limits. Appendix appears normal. No evidence of bowel wall thickening, distention, or inflammatory changes.   Vascular/Lymphatic: Moderate aortic atherosclerosis. No aneurysm. No suspicious lymph nodes   Reproductive: Uterus and bilateral adnexa are unremarkable.   Other: Negative for pelvic effusion or free air   Musculoskeletal: No acute or suspicious osseous abnormality   IMPRESSION: 1. Moderate right hydronephrosis and hydroureter, secondary to a 4 mm stone in the distal right ureter about 2 cm proximal to the right UVJ. 2. Small bilateral kidney stones. 3. Aortic atherosclerosis.   Aortic Atherosclerosis (ICD10-I70.0).     Electronically Signed   By: Jasmine Pang M.D.   On: 08/02/2023 23:38    SD < 1500 HU,  actually closer to 500 HU  Stone not seen on today's KUB  I have independently reviewed the films.  See HPI.   Assessment & Plan:    1. Right ureteral stone -We discussed various treatment options for urolithiasis including observation with or without medical expulsive therapy, shockwave lithotripsy (SWL), ureteroscopy and laser lithotripsy with stent placement.  -We discussed that management is based on stone size, location, density, patient co-morbidities, and  patient preference.   -Stones <62mm in size have a >80% spontaneous passage rate. Data surrounding the use of tamsulosin for medical expulsive therapy is controversial, but meta analyses suggests it is most efficacious for distal stones between 5-41mm in size. Possible side effects include dizziness/lightheadedness -ESWL is not an option because stone is not readily visible on KUB -She has had ureteroscopy in the past and tolerated it well - schedule right ureteroscopy with laser lithotripsy and ureteral stent placement - explained to the patient how  the procedure is performed and the risks involved -informed the patient that they will have an ureteral stent, which will remain in place for approximately 3-10 days and can be associated with flank pain, bladder pain, dysuria, urgency, frequency, urinary leakage, and gross hematuria. - stent may be removed in the office with a cystoscope or patient may be instructed to remove the stent themselves by the string and that is decided on the day of the procedure - residual stones within the kidney or ureter may be present after the procedure and may need to have these addressed at a different encounter - injury to the ureter is the most common intra-operative risk, it may result in an open procedure to correct the defect - infection and bleeding are also risks - explained the risks of general anesthesia, such as: MI, CVA, paralysis, coma and/or death. - advised to contact our office or seek treatment in the ED if becomes febrile or pain/ vomiting are difficult control in order to arrange for emergent/urgent intervention -I refilled her Toradol and her tamsulosin   2.  Microscopic hematuria -UA w/ micro heme  -urine culture pending -Will reassess when she undergoes definitive treatment for her ureteral stone    Return for Right URS/LL/stent placement .  These notes generated with voice recognition software. I apologize for typographical  errors.  Cloretta Ned  [facility] [address]  [address] [place], [place] [address] [phone]

## 2023-08-08 ENCOUNTER — Encounter: Payer: Self-pay | Admitting: Urology

## 2023-08-08 ENCOUNTER — Ambulatory Visit: Payer: 59 | Admitting: Urology

## 2023-08-08 ENCOUNTER — Other Ambulatory Visit: Payer: Self-pay

## 2023-08-08 ENCOUNTER — Ambulatory Visit
Admission: RE | Admit: 2023-08-08 | Discharge: 2023-08-08 | Disposition: A | Discharge status: Home / Self Care | Payer: 59 | Attending: Urology | Admitting: Urology

## 2023-08-08 ENCOUNTER — Ambulatory Visit
Admission: RE | Admit: 2023-08-08 | Discharge: 2023-08-08 | Disposition: A | Discharge status: Home / Self Care | Payer: 59 | Source: Ambulatory Visit | Attending: Urology | Admitting: Urology

## 2023-08-08 ENCOUNTER — Telehealth: Payer: Self-pay | Admitting: Urology

## 2023-08-08 ENCOUNTER — Telehealth: Payer: Self-pay

## 2023-08-08 VITALS — BP 153/87 | HR 90 | Ht 62.0 in | Wt 118.0 lb

## 2023-08-08 DIAGNOSIS — N201 Calculus of ureter: Secondary | ICD-10-CM

## 2023-08-08 DIAGNOSIS — R3129 Other microscopic hematuria: Secondary | ICD-10-CM | POA: Diagnosis not present

## 2023-08-08 DIAGNOSIS — N200 Calculus of kidney: Secondary | ICD-10-CM | POA: Diagnosis present

## 2023-08-08 DIAGNOSIS — F3289 Other specified depressive episodes: Secondary | ICD-10-CM

## 2023-08-08 DIAGNOSIS — F411 Generalized anxiety disorder: Secondary | ICD-10-CM

## 2023-08-08 LAB — URINALYSIS, COMPLETE
Bilirubin, UA: NEGATIVE
Glucose, UA: NEGATIVE
Ketones, UA: NEGATIVE
Leukocytes,UA: NEGATIVE
Nitrite, UA: NEGATIVE
Protein,UA: NEGATIVE
Specific Gravity, UA: 1.02 (ref 1.005–1.030)
Urobilinogen, Ur: 0.2 mg/dL (ref 0.2–1.0)
pH, UA: 6 (ref 5.0–7.5)

## 2023-08-08 LAB — MICROSCOPIC EXAMINATION: Epithelial Cells (non renal): >10 /[HPF] — AB (ref 0–10)

## 2023-08-08 MED ORDER — TAMSULOSIN HCL 0.4 MG PO CAPS: 0.4000 mg | ORAL_CAPSULE | Freq: Every day | ORAL | 1 refills | Status: DC

## 2023-08-08 MED ORDER — LORAZEPAM 0.5 MG PO TABS: 0.5000 mg | ORAL_TABLET | Freq: Every day | ORAL | 2 refills | Status: AC | PRN

## 2023-08-08 MED ORDER — KETOROLAC TROMETHAMINE 10 MG PO TABS: 10.0000 mg | ORAL_TABLET | Freq: Four times a day (QID) | ORAL | 0 refills | Status: DC

## 2023-08-08 NOTE — Telephone Encounter (Signed)
call the pharmacy states pt pick up rx on 2-3 #17

## 2023-08-08 NOTE — Telephone Encounter (Signed)
Spoke to pharmacist Misty Stanley , I have sent a new prescription for lorazepam with new instructions.  However they will fill it only when she is due for it since she picked up a partial prescription recently.  Will have staff advise patient to contact pharmacy.

## 2023-08-08 NOTE — Telephone Encounter (Signed)
Surgical Physician Order Form [facility] [place]  * Scheduling expectation : Next Available with Dr. Richardo Hanks   *Length of Case:   *Clearance needed: no  *Anticoagulation Instructions: N/A  *Aspirin Instructions: N/A  *Post-op visit Date/Instructions:   TBD  *Diagnosis: Right Ureteral Stone  *Procedure: right Ureteroscopy w/laser lithotripsy & stent placement ([address])   Additional orders: N/A  -Admit type: OUTpatient  -Anesthesia: General  -VTE Prophylaxis Standing Order SCD's       Other:   -Standing Lab Orders Per Anesthesia    Lab other: None  -Standing Test orders EKG/Chest x-ray per Anesthesia       Test other:   - Medications:  Ancef 2gm IV  -Other orders:  N/A

## 2023-08-08 NOTE — Telephone Encounter (Signed)
pt left message that she needed a refill on the lorazepam. that the pharmacy did not give he the full dosage and that she needed a whole month supply.

## 2023-08-08 NOTE — Telephone Encounter (Signed)
I had sent in a whole month supply , not sure about why pharmacy only gave 17.Please clarify .

## 2023-08-08 NOTE — Progress Notes (Unsigned)
Surgical Physician Order Form [facility] [place]  * Scheduling expectation : Next Available with Dr. Richardo Hanks   *Length of Case:   *Clearance needed: no  *Anticoagulation Instructions: N/A  *Aspirin Instructions: N/A  *Post-op visit Date/Instructions:   TBD  *Diagnosis: Right Ureteral Stone  *Procedure: right Ureteroscopy w/laser lithotripsy & stent placement ([address])   Additional orders: N/A  -Admit type: OUTpatient  -Anesthesia: General  -VTE Prophylaxis Standing Order SCD's       Other:   -Standing Lab Orders Per Anesthesia    Lab other: None  -Standing Test orders EKG/Chest x-ray per Anesthesia       Test other:   - Medications:  Ancef 2gm IV  -Other orders:  N/A

## 2023-08-08 NOTE — Telephone Encounter (Signed)
confirmed what the pt stated per the pharmacy the insurance states that how he rx was written pt should only get 17 pills. if the rx had take 1 a day then #30 pill would have been given but since the directections were written how they were the insurance will only cover 17 pills

## 2023-08-08 NOTE — Telephone Encounter (Signed)
pt states that she needs more pill states that the pharmacy only gave her 17 pills because of the way the rx was written they would n't give her anymore

## 2023-08-10 ENCOUNTER — Ambulatory Visit: Payer: 59 | Admitting: Urology

## 2023-08-10 ENCOUNTER — Telehealth: Payer: Self-pay

## 2023-08-10 NOTE — Telephone Encounter (Signed)
  Per Dr. Richardo Hanks, Patient is to be scheduled for Right Ureteroscopy with Laser Lithotripsy and Stent Placement   Mrs. Landry was contacted and possible surgical dates were discussed, Friday February 28th, 2025 was agreed upon for surgery.   Patient was directed to call [phone] between 1-3pm the day before surgery to find out surgical arrival time.  Instructions were given not to eat or drink from midnight on the night before surgery and have a driver for the day of surgery. On the surgery day patient was instructed to enter through the Medical Mall entrance of [facility] report the Same Day Surgery desk.   Pre-Admit Testing will be in contact via phone to set up an interview with the anesthesia team to review your history and medications prior to surgery.   Reminder of this information was sent via MyChart to the patient.

## 2023-08-10 NOTE — Progress Notes (Signed)
   [facility] Surgical Posting Form  Surgery Date: Date: 08/25/2023  Surgeon: Dr. Legrand Rams, MD  Inpt ( No  )   Outpt (Yes)   Obs ( No  )   Diagnosis: N20.1 Right Ureteral Stone  -CPT: [phone]  Surgery: Right Ureteroscopy with Laser Lithotripsy and Stent Placement   Stop Anticoagulations: No  Cardiac/Medical/Pulmonary Clearance needed: No  *Orders entered into EPIC  Date: 08/10/23   *Case booked in [place]  Date: 08/10/23  *Notified pt of Surgery: Date: 08/10/23  PRE-OP UA & CX: no  *Placed into Prior Authorization Work [place] Date: 08/10/23  Assistant/laser/rep:No

## 2023-08-11 LAB — CULTURE, URINE COMPREHENSIVE

## 2023-08-17 ENCOUNTER — Other Ambulatory Visit: Payer: Self-pay

## 2023-08-17 ENCOUNTER — Encounter
Admission: RE | Admit: 2023-08-17 | Discharge: 2023-08-17 | Disposition: A | Discharge status: Home / Self Care | Payer: 59 | Source: Ambulatory Visit | Attending: Urology | Admitting: Urology

## 2023-08-17 VITALS — Ht 62.0 in | Wt 118.0 lb

## 2023-08-17 DIAGNOSIS — Z01812 Encounter for preprocedural laboratory examination: Secondary | ICD-10-CM

## 2023-08-17 DIAGNOSIS — I10 Essential (primary) hypertension: Secondary | ICD-10-CM

## 2023-08-17 DIAGNOSIS — Z01810 Encounter for preprocedural cardiovascular examination: Secondary | ICD-10-CM

## 2023-08-17 HISTORY — DX: Hyperlipidemia, unspecified: E78.5

## 2023-08-17 HISTORY — DX: Other intervertebral disc degeneration, lumbosacral region without mention of lumbar back pain or lower extremity pain: M51.379

## 2023-08-17 HISTORY — DX: Migraine, unspecified, not intractable, without status migrainosus: G43.909

## 2023-08-17 HISTORY — DX: Age-related osteoporosis without current pathological fracture: M81.0

## 2023-08-17 HISTORY — DX: Vitamin D deficiency, unspecified: E55.9

## 2023-08-17 HISTORY — DX: Venous insufficiency (chronic) (peripheral): I87.2

## 2023-08-17 HISTORY — DX: Essential (primary) hypertension: I10

## 2023-08-17 HISTORY — DX: Calculus of ureter: N20.1

## 2023-08-17 HISTORY — DX: Cardiac arrhythmia, unspecified: I49.9

## 2023-08-17 NOTE — Patient Instructions (Addendum)
Your procedure is scheduled on: Friday, February 28 Report to the Registration Desk on the 1st floor of the [facility] To find out your arrival time, please call [phone] between 1PM - 3PM on: Thursday, February 27 If your arrival time is 6:00 am, do not arrive before that time as the Medical Mall entrance doors do not open until 6:00 am.  REMEMBER: Instructions that are not followed completely may result in serious medical risk, up to and including death; or upon the discretion of your surgeon and anesthesiologist your surgery may need to be rescheduled.  Do not eat or drink after midnight the night before surgery.  No gum chewing or hard candies.  One week prior to surgery: starting February 21 Stop Anti-inflammatories (NSAIDS) such as Advil, Aleve, Ibuprofen, Motrin, Naproxen, Naprosyn and Aspirin based products such as Excedrin, Goody's Powder, BC Powder. Stop ANY OVER THE COUNTER supplements until after surgery.  You may however, continue to take Tylenol if needed for pain up until the day of surgery.  Continue taking all of your other prescription medications up until the day of surgery.  ON THE DAY OF SURGERY ONLY TAKE THESE MEDICATIONS WITH SIPS OF WATER:  amLODipine (NORVASC)  tamsulosin (FLOMAX)  LORazepam (ATIVAN) - if needed for anxiety oxyCODONE if needed for pain  No Alcohol for 24 hours before or after surgery.  No Smoking including e-cigarettes for 24 hours before surgery.  No chewable tobacco products for at least 6 hours before surgery.  No nicotine patches on the day of surgery.  Do not use any "recreational" drugs for at least a week (preferably 2 weeks) before your surgery.  Please be advised that the combination of cocaine and anesthesia may have negative outcomes, up to and including death. If you test positive for cocaine, your surgery will be cancelled.  On the morning of surgery brush your teeth with toothpaste and water, you may rinse your mouth  with mouthwash if you wish. Do not swallow any toothpaste or mouthwash.  Do not wear jewelry, make-up, hairpins, clips or nail polish.  For welded (permanent) jewelry: bracelets, anklets, waist bands, etc.  Please have this removed prior to surgery.  If it is not removed, there is a chance that hospital personnel will need to cut it off on the day of surgery.  Do not wear lotions, powders, or perfumes.   Do not shave body hair from the neck down 48 hours before surgery.  Contact lenses, hearing aids and dentures may not be worn into surgery.  Do not bring valuables to the hospital. [facility] is not responsible for any missing/lost belongings or valuables.   Notify your doctor if there is any change in your medical condition (cold, fever, infection).  Wear comfortable clothing (specific to your surgery type) to the hospital.  After surgery, you can help prevent lung complications by doing breathing exercises.  Take deep breaths and cough every 1-2 hours.   If you are being discharged the day of surgery, you will not be allowed to drive home. You will need a responsible individual to drive you home and stay with you for 24 hours after surgery.   If you are taking public transportation, you will need to have a responsible individual with you.  Please call the Pre-admissions Testing Dept. at [phone] if you have any questions about these instructions.  Surgery Visitation Policy:  Patients having surgery or a procedure may have two visitors.  Children under the age of 48  must have an adult with them who is not the patient.  Temporary Visitor Restrictions Due to increasing cases of flu, RSV and COVID-19: Children ages 38 and under will not be able to visit patients in [facility] hospitals under most circumstances.

## 2023-08-18 ENCOUNTER — Encounter: Payer: Self-pay | Admitting: Urgent Care

## 2023-08-18 ENCOUNTER — Encounter
Admission: RE | Admit: 2023-08-18 | Discharge: 2023-08-18 | Disposition: A | Discharge status: Home / Self Care | Payer: 59 | Source: Ambulatory Visit | Attending: Urology | Admitting: Urology

## 2023-08-18 DIAGNOSIS — I10 Essential (primary) hypertension: Secondary | ICD-10-CM | POA: Diagnosis not present

## 2023-08-18 DIAGNOSIS — Z01810 Encounter for preprocedural cardiovascular examination: Secondary | ICD-10-CM | POA: Diagnosis not present

## 2023-08-18 DIAGNOSIS — Z01812 Encounter for preprocedural laboratory examination: Secondary | ICD-10-CM

## 2023-08-24 MED ORDER — CEFAZOLIN SODIUM-DEXTROSE 2-4 GM/100ML-% IV SOLN
2.0000 g | INTRAVENOUS | Status: AC
Start: 2023-08-24 — End: 2023-08-25
  Administered 2023-08-25: 2 g via INTRAVENOUS

## 2023-08-24 MED ORDER — LACTATED RINGERS IV SOLN: INTRAVENOUS | Status: DC

## 2023-08-24 MED ORDER — CHLORHEXIDINE GLUCONATE 0.12 % MT SOLN
15.0000 mL | Freq: Once | OROMUCOSAL | Status: AC
  Administered 2023-08-25: 15 mL via OROMUCOSAL

## 2023-08-24 MED ORDER — ORAL CARE MOUTH RINSE: 15.0000 mL | Freq: Once | OROMUCOSAL | Status: AC

## 2023-08-25 ENCOUNTER — Ambulatory Visit: Payer: 59 | Admitting: Urgent Care

## 2023-08-25 ENCOUNTER — Ambulatory Visit: Payer: 59

## 2023-08-25 ENCOUNTER — Ambulatory Visit
Admission: RE | Admit: 2023-08-25 | Discharge: 2023-08-25 | Disposition: A | Discharge status: Home / Self Care | Payer: 59 | Attending: Urology | Admitting: Urology

## 2023-08-25 ENCOUNTER — Other Ambulatory Visit: Payer: Self-pay

## 2023-08-25 ENCOUNTER — Encounter: Payer: Self-pay | Admitting: Urology

## 2023-08-25 ENCOUNTER — Encounter
Admission: RE | Disposition: A | Discharge status: Home / Self Care | Payer: Self-pay | Source: Home / Self Care | Attending: Urology

## 2023-08-25 DIAGNOSIS — N200 Calculus of kidney: Secondary | ICD-10-CM | POA: Diagnosis not present

## 2023-08-25 DIAGNOSIS — I10 Essential (primary) hypertension: Secondary | ICD-10-CM | POA: Insufficient documentation

## 2023-08-25 DIAGNOSIS — R3129 Other microscopic hematuria: Secondary | ICD-10-CM | POA: Diagnosis not present

## 2023-08-25 DIAGNOSIS — N201 Calculus of ureter: Secondary | ICD-10-CM

## 2023-08-25 DIAGNOSIS — F419 Anxiety disorder, unspecified: Secondary | ICD-10-CM | POA: Diagnosis not present

## 2023-08-25 DIAGNOSIS — G43909 Migraine, unspecified, not intractable, without status migrainosus: Secondary | ICD-10-CM | POA: Insufficient documentation

## 2023-08-25 DIAGNOSIS — Z79899 Other long term (current) drug therapy: Secondary | ICD-10-CM | POA: Insufficient documentation

## 2023-08-25 HISTORY — PX: CYSTOSCOPY/URETEROSCOPY/HOLMIUM LASER/STENT PLACEMENT: SHX6546

## 2023-08-25 SURGERY — CYSTOSCOPY/URETEROSCOPY/HOLMIUM LASER/STENT PLACEMENT
Anesthesia: General | Site: Ureter | Laterality: Right

## 2023-08-25 MED ORDER — SODIUM CHLORIDE 0.9 % IR SOLN
Status: DC | PRN
  Administered 2023-08-25: 3000 mL

## 2023-08-25 MED ORDER — IOHEXOL 180 MG/ML  SOLN
INTRAMUSCULAR | Status: DC | PRN
  Administered 2023-08-25: 10 mL

## 2023-08-25 MED ORDER — STERILE WATER FOR IRRIGATION IR SOLN
Status: DC | PRN
  Administered 2023-08-25: 500 mL

## 2023-08-25 MED ORDER — TRAMADOL HCL 50 MG PO TABS: 25.0000 mg | ORAL_TABLET | Freq: Four times a day (QID) | ORAL | 0 refills | Status: AC | PRN

## 2023-08-25 MED ORDER — DIPHENHYDRAMINE HCL 50 MG/ML IJ SOLN
INTRAMUSCULAR | Status: AC
  Filled 2023-08-25: qty 1

## 2023-08-25 MED ORDER — PROPOFOL 10 MG/ML IV BOLUS
INTRAVENOUS | Status: DC | PRN
  Administered 2023-08-25: 150 mg via INTRAVENOUS

## 2023-08-25 MED ORDER — EPHEDRINE SULFATE-NACL 50-0.9 MG/10ML-% IV SOSY
PREFILLED_SYRINGE | INTRAVENOUS | Status: DC | PRN
  Administered 2023-08-25: 5 mg via INTRAVENOUS
  Administered 2023-08-25: 1 mg via INTRAVENOUS

## 2023-08-25 MED ORDER — ONDANSETRON HCL 4 MG/2ML IJ SOLN
INTRAMUSCULAR | Status: AC
  Filled 2023-08-25: qty 2

## 2023-08-25 MED ORDER — OXYCODONE HCL 5 MG PO TABS
ORAL_TABLET | ORAL | Status: AC
  Filled 2023-08-25: qty 1

## 2023-08-25 MED ORDER — LIDOCAINE HCL (CARDIAC) PF 100 MG/5ML IV SOSY
PREFILLED_SYRINGE | INTRAVENOUS | Status: DC | PRN
  Administered 2023-08-25: 100 mg via INTRAVENOUS

## 2023-08-25 MED ORDER — MIDAZOLAM HCL 2 MG/2ML IJ SOLN
INTRAMUSCULAR | Status: DC | PRN
  Administered 2023-08-25: 2 mg via INTRAVENOUS

## 2023-08-25 MED ORDER — LIDOCAINE HCL (PF) 2 % IJ SOLN
INTRAMUSCULAR | Status: AC
  Filled 2023-08-25: qty 5

## 2023-08-25 MED ORDER — KETOROLAC TROMETHAMINE 30 MG/ML IJ SOLN
INTRAMUSCULAR | Status: DC | PRN
  Administered 2023-08-25: 15 mg via INTRAVENOUS

## 2023-08-25 MED ORDER — ONDANSETRON HCL 4 MG/2ML IJ SOLN
4.0000 mg | Freq: Once | INTRAMUSCULAR | Status: AC | PRN
  Administered 2023-08-25: 4 mg via INTRAVENOUS

## 2023-08-25 MED ORDER — PROPOFOL 10 MG/ML IV BOLUS
INTRAVENOUS | Status: AC
  Filled 2023-08-25: qty 20

## 2023-08-25 MED ORDER — CHLORHEXIDINE GLUCONATE 0.12 % MT SOLN
OROMUCOSAL | Status: AC
  Filled 2023-08-25: qty 15

## 2023-08-25 MED ORDER — DEXAMETHASONE SODIUM PHOSPHATE 10 MG/ML IJ SOLN
INTRAMUSCULAR | Status: DC | PRN
  Administered 2023-08-25: 8 mg via INTRAVENOUS

## 2023-08-25 MED ORDER — PHENYLEPHRINE 80 MCG/ML (10ML) SYRINGE FOR IV PUSH (FOR BLOOD PRESSURE SUPPORT)
PREFILLED_SYRINGE | INTRAVENOUS | Status: DC | PRN
  Administered 2023-08-25: 160 ug via INTRAVENOUS
  Administered 2023-08-25: 80 ug via INTRAVENOUS
  Administered 2023-08-25: 160 ug via INTRAVENOUS

## 2023-08-25 MED ORDER — CEFAZOLIN SODIUM-DEXTROSE 2-4 GM/100ML-% IV SOLN
INTRAVENOUS | Status: AC
  Filled 2023-08-25: qty 100

## 2023-08-25 MED ORDER — FENTANYL CITRATE (PF) 100 MCG/2ML IJ SOLN
INTRAMUSCULAR | Status: AC
  Filled 2023-08-25: qty 2

## 2023-08-25 MED ORDER — KETOROLAC TROMETHAMINE 30 MG/ML IJ SOLN
INTRAMUSCULAR | Status: AC
  Filled 2023-08-25: qty 1

## 2023-08-25 MED ORDER — DEXAMETHASONE SODIUM PHOSPHATE 10 MG/ML IJ SOLN
INTRAMUSCULAR | Status: AC
  Filled 2023-08-25: qty 1

## 2023-08-25 MED ORDER — OXYCODONE HCL 5 MG PO TABS
5.0000 mg | ORAL_TABLET | Freq: Once | ORAL | Status: AC | PRN
  Administered 2023-08-25: 5 mg via ORAL

## 2023-08-25 MED ORDER — ONDANSETRON HCL 4 MG/2ML IJ SOLN
INTRAMUSCULAR | Status: DC | PRN
  Administered 2023-08-25: 4 mg via INTRAVENOUS

## 2023-08-25 MED ORDER — OXYCODONE HCL 5 MG/5ML PO SOLN: 5.0000 mg | Freq: Once | ORAL | Status: AC | PRN

## 2023-08-25 MED ORDER — FENTANYL CITRATE (PF) 100 MCG/2ML IJ SOLN
INTRAMUSCULAR | Status: DC | PRN
  Administered 2023-08-25: 50 ug via INTRAVENOUS
  Administered 2023-08-25: 25 ug via INTRAVENOUS

## 2023-08-25 MED ORDER — FENTANYL CITRATE (PF) 100 MCG/2ML IJ SOLN: 25.0000 ug | INTRAMUSCULAR | Status: DC | PRN

## 2023-08-25 MED ORDER — MIDAZOLAM HCL 2 MG/2ML IJ SOLN
INTRAMUSCULAR | Status: AC
  Filled 2023-08-25: qty 2

## 2023-08-25 MED ORDER — DIPHENHYDRAMINE HCL 50 MG/ML IJ SOLN
INTRAMUSCULAR | Status: DC | PRN
  Administered 2023-08-25: 12.5 mg via INTRAVENOUS

## 2023-08-25 SURGICAL SUPPLY — 27 items
ADHESIVE MASTISOL STRL (MISCELLANEOUS) IMPLANT
BAG DRAIN SIEMENS DORNER NS (MISCELLANEOUS) ×1 IMPLANT
BAG PRESSURE INF REUSE 3000 (BAG) ×1 IMPLANT
BRUSH SCRUB EZ 1% IODOPHOR (MISCELLANEOUS) ×1 IMPLANT
BRUSH SCRUB EZ 4% CHG (MISCELLANEOUS) IMPLANT
CATH URET FLEX-TIP 2 LUMEN 10F (CATHETERS) IMPLANT
CATH URETL OPEN 5X70 (CATHETERS) IMPLANT
CNTNR URN SCR LID CUP LEK RST (MISCELLANEOUS) IMPLANT
DRAPE UTILITY 15X26 TOWEL STRL (DRAPES) ×1 IMPLANT
DRSG TEGADERM 2-3/8X2-3/4 SM (GAUZE/BANDAGES/DRESSINGS) IMPLANT
FIBER LASER MOSES 200 DFL (Laser) IMPLANT
FIBER LASER MOSES 365 DFL (Laser) IMPLANT
GLOVE BIOGEL PI IND STRL 7.5 (GLOVE) ×1 IMPLANT
GOWN STRL REUS W/ TWL LRG LVL3 (GOWN DISPOSABLE) ×1 IMPLANT
GOWN STRL REUS W/ TWL XL LVL3 (GOWN DISPOSABLE) ×1 IMPLANT
GUIDEWIRE STR DUAL SENSOR (WIRE) ×1 IMPLANT
IV NS IRRIG 3000ML ARTHROMATIC (IV SOLUTION) ×1 IMPLANT
KIT TURNOVER CYSTO (KITS) ×1 IMPLANT
PACK CYSTO AR (MISCELLANEOUS) ×1 IMPLANT
SET CYSTO W/LG BORE CLAMP LF (SET/KITS/TRAYS/PACK) ×1 IMPLANT
SHEATH NAVIGATOR HD 12/14X36 (SHEATH) IMPLANT
STENT URET 6FRX24 CONTOUR (STENTS) IMPLANT
STENT URET 6FRX26 CONTOUR (STENTS) IMPLANT
SURGILUBE 2OZ TUBE FLIPTOP (MISCELLANEOUS) ×1 IMPLANT
SYR 10ML LL (SYRINGE) ×1 IMPLANT
VALVE UROSEAL ADJ ENDO (VALVE) IMPLANT
WATER STERILE IRR 500ML POUR (IV SOLUTION) ×1 IMPLANT

## 2023-08-25 NOTE — Transfer of Care (Signed)
 Immediate Anesthesia Transfer of Care Note  Patient: Ashley Collins  Procedure(s) Performed: CYSTOSCOPY/URETEROSCOPY/HOLMIUM LASER (Right: Ureter)  Patient Location: PACU  Anesthesia Type:General  Level of Consciousness: drowsy and responds to stimulation  Airway & Oxygen Therapy: Patient Spontanous Breathing and Patient connected to face mask oxygen  Post-op Assessment: Report given to RN and Post -op Vital signs reviewed and stable  Post vital signs: Reviewed and stable  Last Vitals:  Vitals Value Taken Time  BP 128/75 08/25/23 1055  Temp 97   Pulse 93 08/25/23 1059  Resp 7 08/25/23 1059  SpO2 98 % 08/25/23 1059  Vitals shown include unfiled device data.  Last Pain:  Vitals:   08/25/23 0910  TempSrc: Oral         Complications: No notable events documented.

## 2023-08-25 NOTE — Anesthesia Postprocedure Evaluation (Signed)
 Anesthesia Post Note  Patient: Ashley Collins  Procedure(s) Performed: CYSTOSCOPY/URETEROSCOPY/HOLMIUM LASER (Right: Ureter)  Patient location during evaluation: PACU Anesthesia Type: General Level of consciousness: awake Pain management: satisfactory to patient Vital Signs Assessment: post-procedure vital signs reviewed and stable Respiratory status: nonlabored ventilation Cardiovascular status: blood pressure returned to baseline Anesthetic complications: no   No notable events documented.   Last Vitals:  Vitals:   08/25/23 1210 08/25/23 1224  BP: (!) 155/81 (!) 160/78  Pulse: 85 97  Resp: 12 16  Temp: (!) 36.3 C   SpO2: 93% 97%    Last Pain:  Vitals:   08/25/23 1230  TempSrc:   PainSc: 5                  VAN STAVEREN,Keani Gotcher

## 2023-08-25 NOTE — Anesthesia Preprocedure Evaluation (Signed)
 Anesthesia Evaluation  Patient identified by MRN, date of birth, ID band Patient awake    Reviewed: Allergy & Precautions, NPO status , Patient's Chart, lab work & pertinent test results  History of Anesthesia Complications (+) PONV and history of anesthetic complications  Airway Mallampati: II  TM Distance: >3 FB Neck ROM: full    Dental  (+) Teeth Intact   Pulmonary neg pulmonary ROS   Pulmonary exam normal breath sounds clear to auscultation       Cardiovascular Exercise Tolerance: Good hypertension, Pt. 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 No date: Cardiac arrhythmia     Comment:  Nonspecific ST T wave changes on EKG No date: Chronic venous insufficiency of lower extremity No date: Complication of anesthesia     Comment:  nausea and vomiting No date: DDD (degenerative disc disease), lumbosacral No date: Essential hypertension No date: Headache No date: History of kidney stones No date: Hyperlipidemia No date: Kidney stones No date: Migraines No date: Osteoporosis No date: PONV (postoperative nausea and vomiting) No date: Right ureteral stone No date: Vitamin D deficiency  Past Surgical History: No date: AUGMENTATION MAMMAPLASTY No date: CESAREAN SECTION     Comment:  x 4 03/31/2022: COLONOSCOPY WITH PROPOFOL; N/A     Comment:   Procedure: COLONOSCOPY WITH PROPOFOL;  Surgeon: Jaynie Collins, DO;  Location: [facility] ENDOSCOPY;  Service:               Gastroenterology;  Laterality: N/A; 07/10/2020: CYSTOSCOPY W/ RETROGRADES; Bilateral     Comment:  Procedure: CYSTOSCOPY WITH RETROGRADE PYELOGRAM;                Surgeon: Sondra Come, MD;  Location: [facility] ORS;                Service: Urology;  Laterality: Bilateral; No date: CYSTOSCOPY/URETEROSCOPY/HOLMIUM LASER/STENT PLACEMENT 07/10/2020: CYSTOSCOPY/URETEROSCOPY/HOLMIUM LASER/STENT PLACEMENT;  Bilateral     Comment:  Procedure: CYSTOSCOPY/URETEROSCOPY/HOLMIUM LASER/STENT               PLACEMENT;  Surgeon: Sondra Come, MD;  Location:               [facility] ORS;  Service: Urology;  Laterality: Bilateral; No date: EXTRACORPOREAL SHOCK WAVE LITHOTRIPSY     Comment:  x 10 plus No date: Eye Lift No date: FACIAL COSMETIC SURGERY 12/21/2021: GANGLION CYST EXCISION; Right     Comment:  Procedure: REMOVAL GANGLION OF WRIST;  Surgeon: Kennedy Bucker, MD;  Location: [facility] ORS;  Service: Orthopedics;               Laterality: Right; No date: LIPOSUCTION No date: TONSILLECTOMY 12/21/2021: TRIGGER FINGER RELEASE; Right     Comment:  Procedure: RELEASE TRIGGER FINGER/A-1 PULLEY;  Surgeon:               Kennedy Bucker, MD;  Location: [facility] ORS;  Service:               Orthopedics;  Laterality: Right; No date: URETEROSCOPY WITH HOLMIUM LASER LITHOTRIPSY  BMI    Body Mass Index: 21.57 kg/m      Reproductive/Obstetrics negative OB ROS                             Anesthesia Physical Anesthesia Plan  ASA: 2  Anesthesia Plan: General   Post-op Pain Management:    Induction: Intravenous  PONV Risk Score and Plan: 1 and Ondansetron and Dexamethasone  Airway Management Planned: LMA  Additional Equipment:   Intra-op Plan:   Post-operative Plan: Extubation in OR  Informed Consent: I have reviewed the patients  History and Physical, chart, labs and discussed the procedure including the risks, benefits and alternatives for the proposed anesthesia with the patient or authorized representative who has indicated his/her understanding and acceptance.     Dental Advisory Given  Plan Discussed with: CRNA  Anesthesia Plan Comments:        Anesthesia Quick Evaluation

## 2023-08-25 NOTE — Op Note (Signed)
 Date of procedure: 08/25/23  Preoperative diagnosis:  Right ureteral stone Right renal stone  Postoperative diagnosis:  Right renal stone  Procedure: Cystoscopy, right ureteroscopy, laser lithotripsy, right retrograde pyelogram with intraoperative interpretation  Surgeon: Legrand Rams, MD  Anesthesia: General  Complications: None  Intraoperative findings:  Normal bladder, right ureteral stone had passed spontaneously, uncomplicated dusting of a small right lower pole stone Excellent efflux from right ureter, no stent placed  EBL: Minimal  Specimens: None  Drains: None  Indication: Jenisse Vullo is a 63 y.o. patient with history of recurrent calcium oxalate nephrolithiasis who recently presented with a 5 mm right distal ureteral stone and was seen by PA in clinic.  She was scheduled for right ureteroscopy and laser lithotripsy..    After reviewing the management options for treatment, they elected to proceed with the above surgical procedure(s). We have discussed the potential benefits and risks of the procedure, side effects of the proposed treatment, the likelihood of the patient achieving the goals of the procedure, and any potential problems that might occur during the procedure or recuperation. Informed consent has been obtained.  Description of procedure:  The patient was taken to the operating room and general anesthesia was induced. SCDs were placed for DVT prophylaxis. The patient was placed in the dorsal lithotomy position, prepped and draped in the usual sterile fashion, and preoperative antibiotics(Ancef) were administered. A preoperative time-out was performed.   21 French rigid cystoscope was used to intubate the urethra and thorough cystoscopy was performed.  The bladder was grossly normal throughout.  A sensor wire advanced into the right ureteral orifice and passed up to the kidney under fluoroscopic vision.  A semirigid short ureteroscope was advanced alongside  the wire and advanced up to the proximal ureter and no abnormalities or stones were seen.  A digital single-channel flexible ureteroscope was then advanced over the wire and advanced up to the kidney under direct vision.  Thorough pyeloscopy revealed a 3 mm stone in the right lower pole as well as some small Randall's plaques.  A 360 m laser fiber on settings of 0.5 J and 80 Hz were used to methodically fragment all stones.  No stones remained after fragmentation.  A retrograde pyelogram was performed from the right proximal ureter which showed no extravasation or filling defects.  Careful pullback ureteroscopy showed no other abnormalities.  The rigid cystoscope was reinserted and there was excellent efflux from the right ureteral orifice and I opted not to place a stent.  Disposition: Stable to PACU  Plan: Recommend follow-up in clinic in 2 months with repeat 24-hour urine metabolic workup prior  Legrand Rams, MD

## 2023-08-25 NOTE — Interval H&P Note (Signed)
 UROLOGY H&P UPDATE  Agree with prior H&P dated 08/08/2023.  5 mm right distal ureteral stone, has extensive history of prior stone disease, we discussed risk of stricture.  Cardiac: RRR Lungs: CTA bilaterally  Laterality: Right Procedure: Right ureteroscopy, laser lithotripsy, stent placement  Urine: Culture 2/11 <25k mixed flora  We specifically discussed the risks ureteroscopy including bleeding, infection/sepsis, stent related symptoms including flank pain/urgency/frequency/incontinence/dysuria, ureteral injury, ureteral stricture, inability to access stone, or need for staged or additional procedures.   Sondra Come, MD 08/25/2023

## 2023-08-25 NOTE — Anesthesia Procedure Notes (Signed)
 Procedure Name: LMA Insertion Date/Time: 08/25/2023 10:23 AM  Performed by: Rich Brave, CRNAPre-anesthesia Checklist: Patient identified, Emergency Drugs available, Suction available, Patient being monitored and Timeout performed Patient Re-evaluated:Patient Re-evaluated prior to induction Oxygen Delivery Method: Circle system utilized Preoxygenation: Pre-oxygenation with 100% oxygen Induction Type: IV induction Ventilation: Mask ventilation without difficulty LMA: LMA inserted LMA Size: 3.0 Number of attempts: 2 Placement Confirmation: ETT inserted through vocal cords under direct vision, positive ETCO2 and breath sounds checked- equal and bilateral Tube secured with: Tape Dental Injury: Teeth and Oropharynx as per pre-operative assessment

## 2023-08-26 ENCOUNTER — Encounter: Payer: Self-pay | Admitting: Urology

## 2023-09-01 ENCOUNTER — Other Ambulatory Visit: Payer: Self-pay

## 2023-09-01 DIAGNOSIS — N200 Calculus of kidney: Secondary | ICD-10-CM

## 2023-09-13 ENCOUNTER — Encounter: Payer: Self-pay | Admitting: Urology

## 2023-09-23 LAB — LITHOLINK 24HR URINE PANEL
Ammonium, Urine: 14 mmol/(24.h) — ABNORMAL LOW (ref 15–60)
Calcium Oxalate Saturation: 13.26 — ABNORMAL HIGH (ref 6.00–10.00)
Calcium Phosphate Saturation: 2.87 — ABNORMAL HIGH (ref 0.50–2.00)
Calcium, Urine: 144 mg/(24.h) (ref <200)
Calcium/Creatinine Ratio: 156 mg/g{creat} (ref 51–262)
Calcium/Kg Body Weight: 2.8 mg/kg/d (ref <4.0)
Chloride, Urine: 35 mmol/(24.h) — ABNORMAL LOW (ref 70–250)
Citrate, Urine: 467 mg/(24.h) — ABNORMAL LOW (ref >550)
Creatinine, Urine: 920 mg/(24.h)
Creatinine/Kg Body Weight: 17.6 mg/kg/d (ref 8.7–20.3)
Magnesium, Urine: 62 mg/(24.h) (ref 30–120)
Oxalate, Urine: 33 mg/(24.h) (ref 20–40)
Phosphorus, Urine: 477 mg/(24.h) — ABNORMAL LOW (ref 600–1200)
Potassium, Urine: 46 mmol/(24.h) (ref 20–100)
Protein Catabolic Rate: 0.8 g/kg/d (ref 0.8–1.4)
Sodium, Urine: 43 mmol/(24.h) — ABNORMAL LOW (ref 50–150)
Sulfate, Urine: 18 meq/(24.h) — ABNORMAL LOW (ref 20–80)
Urea Nitrogen, Urine: 5.4 g/(24.h) — ABNORMAL LOW (ref 6.00–14.00)
Uric Acid Saturation: 0.29 (ref <1.00)
Uric Acid, Urine: 597 mg/(24.h) (ref <750)
Urine Volume (Preserved): 1030 mL/(24.h) (ref 500–4000)
pH, 24 hr, Urine: 6.679 — ABNORMAL HIGH (ref 5.800–6.200)

## 2023-09-28 ENCOUNTER — Encounter: Payer: Self-pay | Admitting: Psychiatry

## 2023-09-28 ENCOUNTER — Telehealth (INDEPENDENT_AMBULATORY_CARE_PROVIDER_SITE_OTHER): Payer: Self-pay | Admitting: Psychiatry

## 2023-09-28 DIAGNOSIS — G4701 Insomnia due to medical condition: Secondary | ICD-10-CM

## 2023-09-28 DIAGNOSIS — F3289 Other specified depressive episodes: Secondary | ICD-10-CM

## 2023-09-28 DIAGNOSIS — Z79899 Other long term (current) drug therapy: Secondary | ICD-10-CM | POA: Diagnosis not present

## 2023-09-28 DIAGNOSIS — F411 Generalized anxiety disorder: Secondary | ICD-10-CM | POA: Diagnosis not present

## 2023-09-28 MED ORDER — PROPRANOLOL HCL 10 MG PO TABS: 10.0000 mg | ORAL_TABLET | Freq: Two times a day (BID) | ORAL | 1 refills | Status: DC | PRN

## 2023-09-28 NOTE — Progress Notes (Signed)
 Virtual Visit via Video Note  I connected with Ashley Collins on 09/28/23 at  1:00 PM EDT by a video enabled telemedicine application and verified that I am speaking with the correct person using two identifiers.  Location Provider Location : [facility] Patient Location : Home  Participants: Patient , Provider    I discussed the limitations of evaluation and management by telemedicine and the availability of in person appointments. The patient expressed understanding and agreed to proceed.   I discussed the assessment and treatment plan with the patient. The patient was provided an opportunity to ask questions and all were answered. The patient agreed with the plan and demonstrated an understanding of the instructions.   The patient was advised to call back or seek an in-person evaluation if the symptoms worsen or if the condition fails to improve as anticipated.   [facility] MD OP Progress Note  09/28/2023 7:40 PM Ashley Collins  MRN:  [id number]  Chief Complaint:  Chief Complaint  Patient presents with   Follow-up   Anxiety   Depression   Medication Refill   HPI: [name] is a 63 year old Caucasian female, currently on SSD, married, lives in [place], employed, has a history of GAD, other specified depression, insomnia, long-term current use of benzodiazepines, migraine headaches, kidney stones, was evaluated by telemedicine today.  She is experiencing significant anxiety related to her personal and professional life. 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 Her husband's condition has led to increased stress, as he often accuses her of infidelity due to his memory lapses.  She continues to participate in support groups for caregivers of patients with dementia.  That has been beneficial although it continues to be a  struggle.  She is currently taking lorazepam 0.5 mg as needed, with a prescription of 26 pills to last 30 days. She also takes amitriptyline 75 mg for headaches, which she finds effective at this dose. She has previously tried a higher dose of amitriptyline but experienced side effects such as feeling lightheaded and 'high'.  Her sleep is reportedly good, aided by nightly use of zolpidem, and she denies any issues with insomnia. She does not experience her husband waking her at night, as he typically watches TV until she goes to bed.  She has a history of migraines, which are currently well-managed on current medication regimen.  No symptoms of depression, such as sadness or hopelessness, but she describes feeling 'overwhelmed' by her current life circumstances. She maintains a good appetite and denies any significant changes in her eating habits.  Visit Diagnosis:    ICD-10-CM   1. GAD (generalized anxiety disorder)  F41.1 propranolol (INDERAL) 10 MG tablet    2. Other specified depressive episodes  F32.89    Depressive episode with insufficient symptoms    3. Insomnia due to medical condition  G47.01    Mood, headaches    4. Long-term current use of benzodiazepine  Z79.899       Past Psychiatric History: I have reviewed past psychiatric history from progress note on 02/02/2022.  Past trials of medications like Celexa, Xanax, Ambien.  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   Cardiac arrhythmia    Nonspecific ST T wave changes on EKG  Chronic venous insufficiency of lower extremity    Complication of anesthesia    nausea and vomiting   DDD (degenerative disc disease), lumbosacral    Essential hypertension    Headache    History of kidney stones    Hyperlipidemia    Kidney stones    Migraines    Osteoporosis    PONV (postoperative nausea and vomiting)    Right ureteral stone    Vitamin D  deficiency     Past Surgical History:  Procedure Laterality Date   AUGMENTATION MAMMAPLASTY     CESAREAN SECTION     x 4   COLONOSCOPY WITH PROPOFOL N/A 03/31/2022   Procedure: COLONOSCOPY WITH PROPOFOL;  Surgeon: Jaynie Collins, DO;  Location: [facility] ENDOSCOPY;  Service: Gastroenterology;  Laterality: N/A;   CYSTOSCOPY W/ RETROGRADES Bilateral 07/10/2020   Procedure: CYSTOSCOPY WITH RETROGRADE PYELOGRAM;  Surgeon: Sondra Come, MD;  Location: [facility] ORS;  Service: Urology;  Laterality: Bilateral;   CYSTOSCOPY/URETEROSCOPY/HOLMIUM LASER/STENT PLACEMENT     CYSTOSCOPY/URETEROSCOPY/HOLMIUM LASER/STENT PLACEMENT Bilateral 07/10/2020   Procedure: CYSTOSCOPY/URETEROSCOPY/HOLMIUM LASER/STENT PLACEMENT;  Surgeon: Sondra Come, MD;  Location: [facility] OR[facility];  Service: Urology;  Laterality: Bilateral;   CYSTOSCOPY/URETEROSCOPY/HOLMIUM LASER/STENT PLACEMENT Right 08/25/2023   Procedure: CYSTOSCOPY/URETEROSCOPY/HOLMIUM LASER;  Surgeon: Sondra Come, MD;  Location: [facility] OR[facility];  Service: Urology;  Laterality: Right;   EXTRACORPOREAL SHOCK WAVE LITHOTRIPSY     x 10 plus   Eye Lift     FACIAL COSMETIC SURGERY     GANGLION CYST EXCISION Right 12/21/2021   Procedure: REMOVAL GANGLION OF WRIST;  Surgeon: Kennedy Bucker, MD;  Location: [facility] ORS;  Service: Orthopedics;  Laterality: Right;   LIPOSUCTION     TONSILLECTOMY     TRIGGER FINGER RELEASE Right 12/21/2021   Procedure: RELEASE TRIGGER FINGER/A-1 PULLEY;  Surgeon: Kennedy Bucker, MD;  Location: [facility] ORS;  Service: Orthopedics;  Laterality: Right;   URETEROSCOPY WITH HOLMIUM LASER LITHOTRIPSY      Family Psychiatric History: I have reviewed family psychiatric history from progress note on 02/02/2022.  Family History:  Family History  Problem Relation Age of Onset   Drug abuse Daughter    Depression Daughter    Depression Son    Bladder Cancer Neg Hx    Kidney cancer Neg Hx    Prostate cancer Neg Hx     Social History: I have reviewed  social history from progress note on 02/02/2022. Social History   Socioeconomic History   Marital status: Married    Spouse name: Ashley Collins   Number of children: 4   Years of education: Not on file   Highest education level: High school graduate  Occupational History   Not on file  Tobacco Use   Smoking status: Never    Passive exposure: Never   Smokeless tobacco: Never  Vaping Use   Vaping status: Never Used  Substance and Sexual Activity   Alcohol use: Not Currently   Drug use: Not Currently   Sexual activity: Yes  Other Topics Concern   Not on file  Social History Narrative   Not on file   Social Drivers of Health   Financial Resource Strain: Low Risk  (05/11/2023)   Received from [facility]   Overall Financial Resource Strain (CARDIA)    Difficulty of Paying Living Expenses: Not hard at all  Food Insecurity: No Food Insecurity (05/11/2023)   [facility]   Hunger Vital Sign    Worried About Running Out of Food  in the Last Year: Never true    Ran Out of Food in the Last Year: Never true  Transportation Needs: No Transportation Needs (05/11/2023)   [facility] - Transportation    In the past 12 months, has lack of transportation kept you from medical appointments or from getting medications?: No    Lack of Transportation (Non-Medical): No  Physical Activity: Not on file  Stress: Not on file  Social Connections: Not on file    Allergies:  Allergies  Allergen Reactions   Percocet [Oxycodone-Acetaminophen] Itching    Pt tolerates both oxycodone and tylenol individually     Metabolic Disorder Labs: No results found for: "HGBA1C", "MPG" No results found for: "PROLACTIN" No results found for: "CHOL", "TRIG", "HDL", "CHOLHDL", "VLDL", "LDLCALC" Lab Results  Component Value Date   TSH 2.743 03/08/2022    Therapeutic Level Labs: No results found for: "LITHIUM" No results  found for: "VALPROATE" No results found for: "CBMZ"  Current Medications: Current Outpatient Medications  Medication Sig Dispense Refill   propranolol (INDERAL) 10 MG tablet Take 1 tablet (10 mg total) by mouth 2 (two) times daily as needed. 60 tablet 1   AIMOVIG 140 MG/ML SOAJ Inject 140 mg as directed every 28 (twenty-eight) days.     amitriptyline (ELAVIL) 75 MG tablet Take 1 tablet (75 mg total) by mouth at bedtime. Got it from the primary care     amLODipine (NORVASC) 5 MG tablet Take 2.5-5 mg by mouth as directed. Take 2.5 mg in the morning and 5 mg at night     cyanocobalamin (VITAMIN B12) 1000 MCG/ML injection Inject 1,000 mcg into the muscle every 30 (thirty) days.     estradiol (ESTRACE) 1 MG tablet Take 1 mg by mouth daily.     ketorolac (TORADOL) 10 MG tablet Take 1 tablet (10 mg total) by mouth every 6 (six) hours. (Patient taking differently: Take 10 mg by mouth every 6 (six) hours as needed for severe pain (pain score 7-10).) 20 tablet 0   LORazepam (ATIVAN) 0.5 MG tablet Take 1 tablet (0.5 mg total) by mouth daily as needed for anxiety. 26 pills must last 30 days 26 tablet 2   methocarbamol (ROBAXIN) 500 MG tablet Take 500 mg by mouth at bedtime as needed for muscle spasms.     naratriptan (AMERGE) 2.5 MG tablet Take 2.5 mg by mouth as needed for migraine.     OnabotulinumtoxinA (BOTOX IM) Inject 1 Dose into the muscle every 3 (three) months.     ondansetron (ZOFRAN-ODT) 4 MG disintegrating tablet Take 1 tablet (4 mg total) by mouth every 8 (eight) hours as needed for nausea or vomiting. 20 tablet 0   oxyCODONE (ROXICODONE) 5 MG immediate release tablet Take 1 tablet (5 mg total) by mouth every 6 (six) hours as needed for severe pain (pain score 7-10). 30 tablet 0   progesterone (PROMETRIUM) 100 MG capsule Take 100 mg by mouth at bedtime.     promethazine (PHENERGAN) 25 MG tablet Take 25 mg by mouth every 8 (eight) hours as needed for vomiting or nausea.     Vitamin D,  Ergocalciferol, (DRISDOL) 1.25 MG (50000 UNIT) CAPS capsule Take 50,000 Units by mouth every Monday.     zolpidem (AMBIEN) 10 MG tablet Take 1 tablet (10 mg total) by mouth at bedtime. 30 tablet 3   No current facility-administered medications for this visit.     Musculoskeletal: Strength & Muscle Tone:  UTA  Gait & Station:  Seated Patient leans: N/A  Psychiatric Specialty Exam: Review of Systems  Psychiatric/Behavioral:  Positive for decreased concentration. The patient is nervous/anxious.     There were no vitals taken for this visit.There is no height or weight on file to calculate BMI.  General Appearance: Casual  Eye Contact:  Good  Speech:  Clear and Coherent  Volume:  Normal  Mood:  Anxious  Affect:  Congruent  Thought Process:  Goal Directed and Descriptions of Associations: Intact  Orientation:  Full (Time, Place, and Person)  Thought Content: Logical   Suicidal Thoughts:  No  Homicidal Thoughts:  No  Memory:  Immediate;   Fair Recent;   Fair Remote;   Fair  Judgement:  Fair  Insight:  Fair  Psychomotor Activity:  Normal  Concentration:  Concentration: Fair and Attention Span: Fair  Recall:  [employer] of Knowledge: Fair  Language: Fair  Akathisia:  No  Handed:  Right  AIMS (if indicated): not done  Assets:  Desire for Improvement Housing Social Support  ADL's:  Intact  Cognition: WNL  Sleep:  Fair   Screenings: GAD-7    Garment/textile technologist Visit from 04/03/2023 in [facility] Office Visit from 07/25/2022 in [facility] Office Visit from 04/25/2022 in [facility] Office Visit from 03/14/2022 in [facility] Office Visit from 02/02/2022 in [facility]  Total GAD-7 Score 18 0 5 20 18       PHQ2-9    Flowsheet Row Office Visit from 04/03/2023 in [facility] Office Visit from 07/25/2022 in [facility] Office Visit from 04/25/2022 in [facility] Office Visit from 02/02/2022 in [facility]  PHQ-2 Total Score 0 0 0 3  PHQ-9 Total Score -- 1 2 13       Flowsheet Row Video Visit from 09/28/2023 in [facility] Admission (Discharged) from 08/25/2023 in [facility] ED from 08/03/2023 in [facility] Emergency Department at [facility]  C-SSRS RISK CATEGORY No Risk No Risk No Risk        Assessment and Plan: Carmina Walle is a 63 year old Caucasian female on SSI, married, lives in [place], has a history of GAD, depression, migraine headaches was evaluated by telemedicine today.  Discussed assessment and plan as noted below.  Generalized anxiety disorder-unstable Tamsyn experiences significant situational anxiety due to her husband's deteriorating health and increased work responsibilities. She feels overwhelmed and requested an increase in lorazepam dosage, which is not recommended due to tolerance and long-term side effects. Her anxiety is situational, and CBT is recommended as a sustainable treatment. Propranolol is discussed as an as-needed medication for anxiety symptoms, effective for panic attacks, social anxiety, and performance anxiety, without causing drowsiness. Emphasis is placed on developing coping strategies and not relying solely on as-needed medications, which may mask symptoms without addressing the underlying issue. - Prescribe Propranolol 10 mg twice a day as needed for anxiety. - Recommend cognitive behavioral therapy (CBT). - Provide a list of therapists in the after-visit summary. - Discuss the importance of not increasing Lorazepam dosage and using it sparingly. - Continue Lorazepam 0.5 mg as  needed 3-4 times a week only for severe anxiety. - Encourage development of coping strategies and tools. - Reviewed [place] AWARxE - Continue  Amitriptyline 75 mg at bedtime prescribed for headaches however it also helps with anxiety.  Patient is not interested in dosage increase of Amitriptyline at this time.  She reports previous trials caused side effects.  Other specified depressive episode-stable Currently denies any significant depression symptoms. - Continue Amitriptyline 75 mg at bedtime  Insomnia-stable Currently reports sleep is overall stable on the current medication regimen. - Continue Ambien 10 mg at bedtime - Continue sleep hygiene techniques   Follow-up - Follow-up in clinic on May 20 at 11:30 AM by video    Collaboration of Care: Collaboration of Care: Referral or follow-up with counselor/therapist AEB patient encouraged to establish care with a therapist, provided resources in the community for therapy.  Patient/Guardian was advised Release of Information must be obtained prior to any record release in order to collaborate their care with an outside provider. Patient/Guardian was advised if they have not already done so to contact the registration department to sign all necessary forms in order for [place] to release information regarding their care.   Consent: Patient/Guardian gives verbal consent for treatment and assignment of benefits for services provided during this visit. Patient/Guardian expressed understanding and agreed to proceed.  This note was generated in part or whole with voice recognition software. Voice recognition is usually quite accurate but there are transcription errors that can and very often do occur. I apologize for any typographical errors that were not detected and corrected.   Discussed the use of a AI scribe software for clinical note transcription with the patient, who gave verbal consent to proceed.    Jomarie Longs, MD 09/28/2023, 7:40  PM

## 2023-09-28 NOTE — Patient Instructions (Addendum)
 [email or web address]  [email or web address]  [email or web address] [facility] [address], [place], [place] [address]   [phone]  [facility] [email or web address] [address] [place], [place] [address]  [phone]  [facility] [email or web address] [address], [place], [place] [address]  [phone]   [facility] - [phone]  [facility] - [phone]  [facility] - [phone] counseling [phone]  [facility] [phone]   [email or web address] this clinician can offer telehealth and has a sliding scale option  [email or web address] this group also offers sliding scale rates and is based out of    [employer] [facility] has interns who offer sliding scale rates and some of the full time clinicians do, as well. You complete their contact form on their website and the referrals coordinator will help to get connected to someone   [email or web address] [phone]  Medicaid below :  [facility] [address] [place], [place] [address]  [phone]    [employer] [address] [place], [place] [address]  [phone]    [facility] [address] [place], [place] [address]  [phone]    Propranolol Tablets What is this medication? PROPRANOLOL (proe PRAN oh lole) treats many conditions such as high blood pressure, tremors, and a type of arrhythmia known as AFib (atrial fibrillation). It works by lowering your blood pressure and heart rate, making it easier for your heart to pump blood to the rest of your body. It may be used to prevent migraine headaches. It works by relaxing the blood vessels in the  brain that cause migraines. It belongs to a group of medications called beta blockers. This medicine may be used for other purposes; ask your health care provider or pharmacist if you have questions. COMMON BRAND NAME(S): Inderal What should I tell my care team before I take this medication? They need to know if you have any of these conditions: Diabetes Having surgery Heart or blood vessel conditions, such as slow heartbeat, heart failure, heart block Kidney disease Liver disease Lung or breathing disease, such as asthma or COPD Myasthenia gravis Pheochromocytoma Thyroid disease An unusual or allergic reaction to propranolol, other medications, foods, dyes, or preservatives Pregnant or trying to get pregnant Breastfeeding How should I use this medication? Take this medication by mouth. Take it as directed on the prescription label at the same time every day. Keep taking it unless your care team tells you to stop. Talk to your care team about the use of this medication in children. Special care may be needed. Overdosage: If you think you have taken too much of this medicine contact a poison control center or emergency room at once. NOTE: This medicine is only for you. Do not share this medicine with others. What if I miss a dose? If you miss a dose, take it as soon as you can. If it is almost time for your next dose, take only that dose. Do not take double or extra doses. What may interact with this medication? Do not take this medication with any of the following: Thioridazine This medication may also interact with the following: Certain medications for blood pressure, heart disease, irregular heartbeat Epinephrine NSAIDs, medications for pain and inflammation, such as ibuprofen or naproxen Warfarin Other medications may affect the way this medication works. Talk  with your care team about all of the medications you take. They may suggest changes to your treatment plan to lower the  risk of side effects and to make sure your medications work as intended. This list may not describe all possible interactions. Give your health care provider a list of all the medicines, herbs, non-prescription drugs, or dietary supplements you use. Also tell them if you smoke, drink alcohol, or use illegal drugs. Some items may interact with your medicine. What should I watch for while using this medication? Visit your care team for regular checks on your progress. Check your blood pressure as directed. Know what your blood pressure should be and when to contact your care team. This medication may affect your coordination, reaction time, or judgment. Do not drive or operate machinery until you know how this medication affects you. Sit up or stand slowly to reduce the risk of dizzy or fainting spells. Drinking alcohol with this medication can increase the risk of these side effects. Do not suddenly stop taking this medication. This may increase your risk of side effects, such as chest pain and heart attack. If you no longer need to take this medication, your care team will lower the dose slowly over time to decrease the risk of side effects. If you are going to need surgery or a procedure, tell your care team that you are using this medication. This medication may affect blood glucose levels. It can also mask the symptoms of low blood sugar, such as a rapid heartbeat and tremors. If you have diabetes, it is important to check your blood sugar often while you are taking this medication. Do not treat yourself for coughs, colds, or pain while you are using this medication without asking your care team for advice. Some medications may increase your blood pressure. What side effects may I notice from receiving this medication? Side effects that you should report to your care team as soon as possible: Allergic reactions--skin rash, itching, hives, swelling of the face, lips, tongue, or throat Heart  failure--shortness of breath, swelling of the ankles, feet, or hands, sudden weight gain, unusual weakness or fatigue Low blood pressure--dizziness, feeling faint or lightheaded, blurry vision Raynaud's--cool, numb, or painful fingers or toes that may change color from pale, to blue, to red Redness, blistering, peeling, or loosening of the skin, including inside the mouth Slow heartbeat--dizziness, feeling faint or lightheaded, confusion, trouble breathing, unusual weakness or fatigue Worsening mood, feelings of depression Side effects that usually do not require medical attention (report to your care team if they continue or are bothersome): Change in sex drive or performance Diarrhea Dizziness Fatigue Headache This list may not describe all possible side effects. Call your doctor for medical advice about side effects. You may report side effects to FDA at 1-800-FDA-1088. Where should I keep my medication? Keep out of the reach of children and pets. Store at room temperature between 20 and 25 degrees C (68 and 77 degrees F). Protect from light. Throw away any unused medication after the expiration date. NOTE: This sheet is a summary. It may not cover all possible information. If you have questions about this medicine, talk to your doctor, pharmacist, or health care provider.  2024 Elsevier/Gold Standard (2022-06-13 00:00:00)

## 2023-10-04 DIAGNOSIS — N200 Calculus of kidney: Secondary | ICD-10-CM

## 2023-10-09 ENCOUNTER — Other Ambulatory Visit: Payer: Self-pay

## 2023-10-10 MED ORDER — KETOROLAC TROMETHAMINE 10 MG PO TABS
10.0000 mg | ORAL_TABLET | Freq: Four times a day (QID) | ORAL | 0 refills | Status: DC
Start: 2023-10-10 — End: 2023-11-14

## 2023-10-11 ENCOUNTER — Telehealth: Payer: Self-pay

## 2023-10-11 NOTE — Telephone Encounter (Signed)
 pt called states that she has been having migraine headaches since she started the propranolol. she like to stop this and try something else. pt was last seen on 4-3 next appt 5-20

## 2023-10-11 NOTE — Telephone Encounter (Signed)
 She may discontinue propranolol if she experiences side effects. Chart review indicates a prior trial of Buspar, hydroxyizine and she is currently taking amitriptyline. Given that any medication changes may involve agents with a higher side effect profile, I recommend she follow up with Dr. Eappen for further discussion upon her return. Please let me know if she has concern about this.

## 2023-10-12 NOTE — Telephone Encounter (Signed)
 Pt was notified.

## 2023-10-12 NOTE — Telephone Encounter (Signed)
 notified pt but she states that she leaving to go to [place]  because her mom is in hospic care and she needs something to help her nerves. she states her mom is dying and she needs something to help her get through this.

## 2023-10-22 ENCOUNTER — Other Ambulatory Visit: Payer: Self-pay | Admitting: Urology

## 2023-10-30 NOTE — Telephone Encounter (Signed)
 Noted.

## 2023-10-31 ENCOUNTER — Ambulatory Visit: Payer: Self-pay | Admitting: Urology

## 2023-11-07 NOTE — Telephone Encounter (Signed)
 Noted.

## 2023-11-14 ENCOUNTER — Encounter (INDEPENDENT_AMBULATORY_CARE_PROVIDER_SITE_OTHER): Payer: Self-pay

## 2023-11-14 ENCOUNTER — Telehealth (INDEPENDENT_AMBULATORY_CARE_PROVIDER_SITE_OTHER): Admitting: Psychiatry

## 2023-11-14 ENCOUNTER — Encounter: Payer: Self-pay | Admitting: Psychiatry

## 2023-11-14 DIAGNOSIS — F3289 Other specified depressive episodes: Secondary | ICD-10-CM | POA: Diagnosis not present

## 2023-11-14 DIAGNOSIS — F411 Generalized anxiety disorder: Secondary | ICD-10-CM | POA: Diagnosis not present

## 2023-11-14 DIAGNOSIS — Z79899 Other long term (current) drug therapy: Secondary | ICD-10-CM

## 2023-11-14 DIAGNOSIS — G4701 Insomnia due to medical condition: Secondary | ICD-10-CM | POA: Diagnosis not present

## 2023-11-14 MED ORDER — ZOLPIDEM TARTRATE 10 MG PO TABS: 10.0000 mg | ORAL_TABLET | Freq: Every day | ORAL | 3 refills | Status: DC

## 2023-11-14 MED ORDER — LORAZEPAM 0.5 MG PO TABS: 0.5000 mg | ORAL_TABLET | ORAL | 2 refills | Status: DC

## 2023-11-14 MED ORDER — LAMOTRIGINE 25 MG PO TABS
25.0000 mg | ORAL_TABLET | Freq: Every day | ORAL | 1 refills | Status: DC
Start: 2023-11-14 — End: 2023-12-20

## 2023-11-14 MED ORDER — HYDROXYZINE HCL 10 MG PO TABS: 10.0000 mg | ORAL_TABLET | Freq: Three times a day (TID) | ORAL | 2 refills | Status: DC | PRN

## 2023-11-14 NOTE — Progress Notes (Signed)
 Virtual Visit via Video Note  I connected with Ashley Collins on 11/14/23 at 11:30 AM EDT by a video enabled telemedicine application and verified that I am speaking with the correct person using two identifiers.  Location Provider Location : [facility] Patient Location : Home  Participants: Patient , Provider    I discussed the limitations of evaluation and management by telemedicine and the availability of in person appointments. The patient expressed understanding and agreed to proceed.   I discussed the assessment and treatment plan with the patient. The patient was provided an opportunity to ask questions and all were answered. The patient agreed with the plan and demonstrated an understanding of the instructions.   The patient was advised to call back or seek an in-person evaluation if the symptoms worsen or if the condition fails to improve as anticipated.  BH MD OP Progress Note  11/14/2023 12:51 PM Ashley Collins  MRN:  [id number]  Chief Complaint:  Chief Complaint  Patient presents with   Follow-up   Anxiety   Depression   Medication Refill   Discussed the use of AI scribe software for clinical note transcription with the patient, who gave verbal consent to proceed.  History of Present Illness Ashley Collins is a 63 year old Caucasian female currently employed, lives in [place], has a history of GAD, other specified depression, insomnia, long-term current use of benzodiazepines, migraine headaches, kidney stones was evaluated by telemedicine today.  She presents with anxiety and stress related to caregiving responsibilities and work pressures.  She experiences significant anxiety and stress due to multiple personal and family-related issues. She recently returned from [place] , where her mother is in hospice care, and is now dealing with her husband's worsening dementia. Her son has moved to [place]  , and she has not heard from him, adding to her stress.   Her husband, who is  33 years old, is experiencing progressive memory issues. He is described as restless and impulsive, with significant memory loss, unable to remember recent events or interactions. He refuses to take medication or engage in social activities, which limits his support options. Her daughter has moved back home to assist with caregiving duties.  She is currently working close to 50 hours a week and is responsible for managing a large team at work. She had a breakdown recently, characterized by difficulty breathing and vomiting, which she attributes to stress. She is taking lorazepam  0.5 mg and hydroxyzine  10 mg as needed for anxiety, preferring lorazepam  but using it sparingly. She also takes amitriptyline  for migraines, which have been well-managed with Botox injections and Aimovig.  She currently takes Ambien  which helps with sleep although her husband has been erratic sleep pattern.  She reports appetite is fair.  She denies any suicidality, homicidality or perceptual disturbances.  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  She reports that has been helpful.    Visit Diagnosis:    ICD-10-CM   1. GAD (generalized anxiety disorder)  F41.1 lamoTRIgine (LAMICTAL) 25 MG tablet    LORazepam  (ATIVAN ) 0.5 MG tablet    hydrOXYzine  (ATARAX ) 10 MG tablet    2. Other specified depressive episodes  F32.89 lamoTRIgine (LAMICTAL) 25 MG tablet   Depressive episodes with insufficient symptoms.    3. Insomnia due to medical condition  G47.01 zolpidem  (AMBIEN ) 10 MG tablet   mood, headaches    4. Long-term current  use of benzodiazepine  Z79.899       Past Psychiatric History: I have reviewed past psychiatric history from progress note on 02/02/2022.  Past trials of medications like Celexa , Xanax , Ambien .  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   Cardiac arrhythmia    Nonspecific ST T wave changes on EKG   Chronic venous insufficiency of lower extremity    Complication of anesthesia    nausea and vomiting   DDD (degenerative disc disease), lumbosacral    Essential hypertension    Headache    History of kidney stones    Hyperlipidemia    Kidney stones    Migraines    Osteoporosis    PONV (postoperative nausea and vomiting)    Right ureteral stone    Vitamin D deficiency     Past Surgical History:  Procedure Laterality Date   AUGMENTATION MAMMAPLASTY     CESAREAN SECTION     x 4   COLONOSCOPY WITH PROPOFOL  N/A 03/31/2022   Procedure: COLONOSCOPY WITH PROPOFOL ;  Surgeon: Quintin Buckle, DO;  Location: [facility];  Service: Gastroenterology;  Laterality: N/A;   CYSTOSCOPY W/ RETROGRADES Bilateral 07/10/2020   Procedure: CYSTOSCOPY WITH RETROGRADE PYELOGRAM;  Surgeon: Lawerence Pressman, MD;  Location: [facility];  Service: Urology;  Laterality: Bilateral;   CYSTOSCOPY/URETEROSCOPY/HOLMIUM LASER/STENT PLACEMENT     CYSTOSCOPY/URETEROSCOPY/HOLMIUM LASER/STENT PLACEMENT Bilateral 07/10/2020   Procedure: CYSTOSCOPY/URETEROSCOPY/HOLMIUM LASER/STENT PLACEMENT;  Surgeon: Lawerence Pressman, MD;  Location: [facility];  Service: Urology;  Laterality: Bilateral;   CYSTOSCOPY/URETEROSCOPY/HOLMIUM LASER/STENT PLACEMENT Right 08/25/2023   Procedure: CYSTOSCOPY/URETEROSCOPY/HOLMIUM LASER;  Surgeon: Lawerence Pressman, MD;  Location: [facility];  Service: Urology;  Laterality: Right;   EXTRACORPOREAL SHOCK WAVE LITHOTRIPSY     x 10 plus   Eye Lift     FACIAL COSMETIC SURGERY     GANGLION CYST EXCISION Right 12/21/2021   Procedure: REMOVAL GANGLION OF WRIST;  Surgeon: Molli Angelucci, MD;  Location: [facility] OR[facility];  Service: Orthopedics;  Laterality: Right;   LIPOSUCTION     TONSILLECTOMY     TRIGGER FINGER RELEASE Right 12/21/2021   Procedure: RELEASE TRIGGER FINGER/A-1 PULLEY;  Surgeon:  Molli Angelucci, MD;  Location: [facility] OR[facility];  Service: Orthopedics;  Laterality: Right;   URETEROSCOPY WITH HOLMIUM LASER LITHOTRIPSY      Family Psychiatric History: I have reviewed family psychiatric history from progress note on 02/02/2022.  Family History:  Family History  Problem Relation Age of Onset   Drug abuse Daughter    Depression Daughter    Depression Son    Bladder Cancer Neg Hx    Kidney cancer Neg Hx    Prostate cancer Neg Hx     Social History: I have reviewed social history from progress note on 02/02/2022. Social History   Socioeconomic History   Marital status: Married    Spouse name: james   Number of children: 4   Years of education: Not on file   Highest education level: High school graduate  Occupational History   Not on file  Tobacco Use   Smoking status: Never    Passive exposure: Never   Smokeless tobacco: Never  Vaping Use   Vaping status: Never Used  Substance and Sexual Activity   Alcohol use: Not Currently   Drug use: Not Currently   Sexual activity: Yes  Other Topics Concern   Not on file  Social History  Narrative   Not on file   Social Drivers of Health   Financial Resource Strain: Patient Declined (11/10/2023)   Received from [facility]   Overall Financial Resource Strain (CARDIA)    Difficulty of Paying Living Expenses: Patient declined  Food Insecurity: Patient Declined (11/10/2023)   Received from [facility]   Hunger Vital Sign    Worried About Running Out of Food in the Last Year: Patient declined    Ran Out of Food in the Last Year: Patient declined  Transportation Needs: No Transportation Needs (11/10/2023)   Received from [facility] - Transportation    In the past 12 months, has lack of transportation kept you from medical appointments or from getting medications?: No    Lack of Transportation (Non-Medical): No  Physical Activity: Not on file  Stress: Not on file   Social Connections: Not on file    Allergies:  Allergies  Allergen Reactions   Percocet [Oxycodone -Acetaminophen ] Itching    Pt tolerates both oxycodone  and tylenol  individually     Metabolic Disorder Labs: No results found for: "HGBA1C", "MPG" No results found for: "PROLACTIN" No results found for: "CHOL", "TRIG", "HDL", "CHOLHDL", "VLDL", "LDLCALC" Lab Results  Component Value Date   TSH 2.743 03/08/2022    Therapeutic Level Labs: No results found for: "LITHIUM" No results found for: "VALPROATE" No results found for: "CBMZ"  Current Medications: Current Outpatient Medications  Medication Sig Dispense Refill   lamoTRIgine (LAMICTAL) 25 MG tablet Take 1 tablet (25 mg total) by mouth daily. 30 tablet 1   LORazepam  (ATIVAN ) 0.5 MG tablet Take 1 tablet (0.5 mg total) by mouth as directed. Take 1 tablet up to 3-4 times a week only for severe anxiety attacks,pls limit use, 26 tablets must last 30 days 26 tablet 2   AIMOVIG 140 MG/ML SOAJ Inject 140 mg as directed every 28 (twenty-eight) days.     amitriptyline  (ELAVIL ) 75 MG tablet Take 1 tablet (75 mg total) by mouth at bedtime. Got it from the primary care     amLODipine (NORVASC) 5 MG tablet Take 2.5-5 mg by mouth as directed. Take 2.5 mg in the morning and 5 mg at night     cyanocobalamin (VITAMIN B12) 1000 MCG/ML injection Inject 1,000 mcg into the muscle every 30 (thirty) days.     estradiol (ESTRACE) 1 MG tablet Take 1 mg by mouth daily.     hydrOXYzine  (ATARAX ) 10 MG tablet Take 1 tablet (10 mg total) by mouth 3 (three) times daily as needed for anxiety. 90 tablet 2   methocarbamol (ROBAXIN) 500 MG tablet Take 500 mg by mouth at bedtime as needed for muscle spasms.     naratriptan (AMERGE) 2.5 MG tablet Take 2.5 mg by mouth as needed for migraine.     OnabotulinumtoxinA (BOTOX IM) Inject 1 Dose into the muscle every 3 (three) months.     ondansetron  (ZOFRAN -ODT) 4 MG disintegrating tablet Take 1 tablet (4 mg total) by mouth  every 8 (eight) hours as needed for nausea or vomiting. 20 tablet 0   progesterone (PROMETRIUM) 100 MG capsule Take 100 mg by mouth at bedtime.     promethazine  (PHENERGAN ) 25 MG tablet Take 25 mg by mouth every 8 (eight) hours as needed for vomiting or nausea.     Vitamin D, Ergocalciferol, (DRISDOL) 1.25 MG (50000 UNIT) CAPS capsule Take 50,000 Units by mouth every Monday.     zolpidem  (AMBIEN ) 10 MG tablet  Take 1 tablet (10 mg total) by mouth at bedtime. 30 tablet 3   No current facility-administered medications for this visit.     Musculoskeletal: Strength & Muscle Tone: UTA Gait & Station: Seated Patient leans: N/A  Psychiatric Specialty Exam: Review of Systems  Psychiatric/Behavioral:  Positive for dysphoric mood. The patient is nervous/anxious.     There were no vitals taken for this visit.There is no height or weight on file to calculate BMI.  General Appearance: Casual  Eye Contact:  Fair  Speech:  Clear and Coherent  Volume:  Normal  Mood:  Anxious and Depressed  Affect:  Congruent  Thought Process:  Goal Directed and Descriptions of Associations: Intact  Orientation:  Full (Time, Place, and Person)  Thought Content: Logical   Suicidal Thoughts:  No  Homicidal Thoughts:  No  Memory:  Immediate;   Fair Recent;   Fair Remote;   Fair  Judgement:  Fair  Insight:  Fair  Psychomotor Activity:  Normal  Concentration:  Concentration: Fair and Attention Span: Fair  Recall:  [employer] of Knowledge: Fair  Language: Fair  Akathisia:  No  Handed:  Right  AIMS (if indicated): not done  Assets:  Manufacturing systems engineer Desire for Improvement Housing Social Support Transportation  ADL's:  Intact  Cognition: WNL  Sleep:  Fair   Screenings: GAD-7    Garment/textile technologist Visit from 04/03/2023 in [facility] Office Visit from 07/25/2022 in [facility] Office Visit from 04/25/2022 in [facility] Office Visit from 03/14/2022 in [facility] Office Visit from 02/02/2022 in [facility]  Total GAD-7 Score 18 0 5 20 18       PHQ2-9    Flowsheet Row Office Visit from 04/03/2023 in [facility] Office Visit from 07/25/2022 in [facility] Office Visit from 04/25/2022 in [facility] Office Visit from 02/02/2022 in [facility]  PHQ-2 Total Score 0 0 0 3  PHQ-9 Total Score -- 1 2 13       Flowsheet Row Video Visit from 11/14/2023 in [facility] Video Visit from 09/28/2023 in [facility] Admission (Discharged) from 08/25/2023 in [facility]  C-SSRS RISK CATEGORY No Risk No Risk No Risk        Assessment and Plan: Ashley Collins is a 63 year old Caucasian female on SSI, married, lives in [place] has a history of depression, anxiety, migraine headaches was evaluated by telemedicine today.  Discussed assessment and plan as noted below.  Generalized anxiety disorder-unstable Currently continues to have significant situational anxiety due to her mother who is in hospice, has spent the dementia and increased work responsibilities.  She has not been compliant with psychotherapy sessions as discussed.  Currently uses lorazepam  as needed as well as hydroxyzine  as needed.   - Continue Lorazepam  0.5 mg as needed for severe anxiety, patient to limit use. - Reviewed [place] PMP AWARxE - Continue Hydroxyzine  10 mg 3 times a day as needed. - Encouraged again to establish care with therapist to start CBT.  Provided resources in the community.  Other specified depressive disorder-unstable Currently has depressive symptoms  exacerbated by her current situational stressors. - Continue Amitriptyline  75 mg at bedtime, prescribed for headaches.  Previous to his increase of this medication  caused side effects. - Start Lamictal 25 mg daily with the plan to gradually increase the dosage. - Consider SSRI/SNRI in the future however patient provided information about drug to drug interaction with Amitriptyline . - Encouraged to establish care with therapist.  Insomnia-stable Currently sleep good as long as she takes the Ambien . - Continue sleep hygiene techniques. - Continue Ambien  10 mg at bedtime  Follow-up Follow-up in clinic in 4 weeks or sooner if needed.    Collaboration of Care: Collaboration of Care: Referral or follow-up with counselor/therapist AEB encouraged to establish care with the therapist.  Encouraged to continue support groups for caregivers.  Patient/Guardian was advised Release of Information must be obtained prior to any record release in order to collaborate their care with an outside provider. Patient/Guardian was advised if they have not already done so to contact the registration department to sign all necessary forms in order for us  to release information regarding their care.   Consent: Patient/Guardian gives verbal consent for treatment and assignment of benefits for services provided during this visit. Patient/Guardian expressed understanding and agreed to proceed.   This note was generated in part or whole with voice recognition software. Voice recognition is usually quite accurate but there are transcription errors that can and very often do occur. I apologize for any typographical errors that were not detected and corrected.    Dietrich Samuelson, MD 11/15/2023, 9:57 AM

## 2023-11-14 NOTE — Patient Instructions (Addendum)
 [email or web address]  [email or web address]  [email or web address] [facility] [address], [place], [place] [address]   [phone]  [facility] [email or web address] [address] [place], [place] [address]  [phone]  [facility] [email or web address] [address] [place], [place] [address]  [phone]   [facility] - [phone]  [facility] - [phone]  [facility] - [phone] counseling [phone]  [facility] [phone]   [email or web address] this clinician can offer telehealth and has a sliding scale option  [email or web address] this group also offers sliding scale rates and is based out of [place]   [employer] [facility] has interns who offer sliding scale rates and some of the full time clinicians do, as well. You complete their contact form on their website and the referrals coordinator will help to get connected to someone   [email or web address] [phone]  Medicaid below :  [facility] [address] [place], [place] [address]  [phone]    Estela Held [address] [place], [place] [address]  [phone]    [facility] [address] [place], [place] [address]  [phone]    Lamotrigine Tablets What is this medication? LAMOTRIGINE (la MOE tri jeen) prevents and controls seizures in people with epilepsy. It may also be used to treat bipolar disorder. It works by calming overactive nerves in your body. This medicine may be used for other purposes; ask your health care provider or pharmacist if you have questions. COMMON BRAND NAME(S): Lamictal, Subvenite What should I tell my care team  before I take this medication? They need to know if you have any of these conditions: Heart disease History of irregular heartbeat Immune system problems Kidney disease Liver disease Low levels of folic acid in the blood Lupus Mental health condition Suicidal thoughts, plans, or attempt by you or a family member An unusual or allergic reaction to lamotrigine, other medications, foods, dyes, or preservatives Pregnant or trying to get pregnant Breastfeeding How should I use this medication? Take this medication by mouth with a glass of water . Follow the directions on the prescription label. Do not chew these tablets. If this medication upsets your stomach, take it with food or milk. Take your doses at regular intervals. Do not take your medication more often than directed. A special MedGuide will be given to you by the pharmacist with each new prescription and refill. Be sure to read this information carefully each time. Talk to your care team about the use of this medication in children. While this medication may be prescribed for children as young as 2 years for selected conditions, precautions do apply. Overdosage: If you think you have taken too much of this medicine contact a poison control center or emergency room at once. NOTE: This medicine is only for you. Do not share this medicine with others. What if I miss a dose? If you miss a dose, take it as soon as you can. If it is almost time for your next dose, take only that dose. Do not take double or extra doses. What may interact with this medication? Atazanavir Certain medications for irregular heartbeat Certain medications for seizures, such as carbamazepine, phenobarbital, phenytoin, primidone, or valproic acid Estrogen or progestin hormones Lopinavir Rifampin Ritonavir This list may not describe all possible interactions. Give your health care  provider a list of all the medicines, herbs, non-prescription drugs, or dietary  supplements you use. Also tell them if you smoke, drink alcohol, or use illegal drugs. Some items may interact with your medicine. What should I watch for while using this medication? Visit your care team for regular checks on your progress. If you take this medication for seizures, wear a Medic Alert bracelet or necklace. Carry an identification card with information about your condition, medications, and care team. It is important to take this medication exactly as directed. When first starting treatment, your dose will need to be adjusted slowly. It may take weeks or months before your dose is stable. You should contact your care team if your seizures get worse or if you have any new types of seizures. Do not stop taking this medication unless instructed by your care team. Stopping your medication suddenly can increase your seizures or their severity. This medication may cause serious skin reactions. They can happen weeks to months after starting the medication. Contact your care team right away if you notice fevers or flu-like symptoms with a rash. The rash may be red or purple and then turn into blisters or peeling of the skin. You may also notice a red rash with swelling of the face, lips, or lymph nodes in your neck or under your arms. This medication may affect your coordination, reaction time, or judgment. Do not drive or operate machinery until you know how this medication affects you. Sit up or stand slowly to reduce the risk of dizzy or fainting spells. Drinking alcohol with this medication can increase the risk of these side effects. If you are taking this medication for bipolar disorder, it is important to report any changes in your mood to your care team. If your condition gets worse, you get mentally depressed, feel very hyperactive or manic, have difficulty sleeping, or have thoughts of hurting yourself or committing suicide, you need to get help from your care team right away. If you are a  caregiver for someone taking this medication for bipolar disorder, you should also report these behavioral changes right away. The use of this medication may increase the chance of suicidal thoughts or actions. Pay special attention to how you are responding while on this medication. Your mouth may get dry. Chewing sugarless gum or sucking hard candy and drinking plenty of water  may help. Contact your care team if the problem does not go away or is severe. If you become pregnant while using this medication, you may enroll in the [place] American Antiepileptic Drug Pregnancy Registry by calling [phone]. This registry collects information about the safety of antiepileptic medication use during pregnancy. This medication may cause a decrease in folic acid. You should make sure that you get enough folic acid while you are taking this medication. Discuss the foods you eat and the vitamins you take with your care team. What side effects may I notice from receiving this medication? Side effects that you should report to your care team as soon as possible: Allergic reactions--skin rash, itching, hives, swelling of the face, lips, tongue, or throat Change in vision Fever, neck pain or stiffness, sensitivity to light, headache, nausea, vomiting, confusion, which may be signs of meningitis Fever, rash, swollen lymph nodes, confusion, trouble walking, loss of balance or coordination, seizures Heart rhythm changes--fast or irregular heartbeat, dizziness, feeling faint or lightheaded, chest pain, trouble breathing Infection--fever, chills, cough, or sore throat Low red blood cell level--unusual weakness or fatigue, dizziness, headache,  trouble breathing Rash, fever, and swollen lymph nodes Redness, blistering, peeling, or loosening of the skin, including inside the mouth Thoughts of suicide or self-harm, worsening mood, feelings of depression Unusual bruising or bleeding Side effects that usually do not  require medical attention (report these to your care team if they continue or are bothersome): Diarrhea Dizziness Drowsiness Headache Nausea Stomach pain Tremors or shaking This list may not describe all possible side effects. Call your doctor for medical advice about side effects. You may report side effects to FDA at 1-800-FDA-1088. Where should I keep my medication? Keep out of the reach of children and pets. Store at [employer] C (77 degrees F). Protect from light. Get rid of any unused medication after the expiration date. To get rid of medications that are no longer needed or have expired: Take the medication to a medication take-back program. Check with your pharmacy or law enforcement to find a location. If you cannot return the medication, check the label or package insert to see if the medication should be thrown out in the garbage or flushed down the toilet. If you are not sure, ask your care team. If it is safe to put it in the trash, empty the medication out of the container. Mix the medication with cat litter, dirt, coffee grounds, or other unwanted substance. Seal the mixture in a bag or container. Put it in the trash. NOTE: This sheet is a summary. It may not cover all possible information. If you have questions about this medicine, talk to your doctor, pharmacist, or health care provider.  2024 Elsevier/Gold Standard (2023-05-26 00:00:00)

## 2023-11-21 ENCOUNTER — Telehealth: Payer: Self-pay

## 2023-11-21 NOTE — Telephone Encounter (Signed)
 pt was notified that the issues was that at the time she requested insurance stated that it was too soon. but they were able to get it filled today. call pharmacy later today

## 2023-11-21 NOTE — Telephone Encounter (Signed)
 pt left a message that she needed a refill on the ambien  and the lorazepam . pt last seen on 5-20 next appt 6-24. (pt should have rx will call pharmacy)

## 2023-11-21 NOTE — Telephone Encounter (Signed)
 Called patient to make aware of the date and location her medication was sent no answer left voicemail for patient to return call to office

## 2023-11-21 NOTE — Telephone Encounter (Signed)
 pharamcy states that they do have rx that they could not fill until today and they get it filled today..  the issues was she was requesting too soon per insurance.

## 2023-11-23 ENCOUNTER — Other Ambulatory Visit: Payer: Self-pay | Admitting: Psychiatry

## 2023-11-23 DIAGNOSIS — F411 Generalized anxiety disorder: Secondary | ICD-10-CM

## 2023-11-27 ENCOUNTER — Other Ambulatory Visit: Payer: Self-pay

## 2023-11-27 ENCOUNTER — Emergency Department
Admission: EM | Admit: 2023-11-27 | Discharge: 2023-11-28 | Disposition: A | Discharge status: Home / Self Care | Attending: Emergency Medicine | Admitting: Emergency Medicine

## 2023-11-27 ENCOUNTER — Emergency Department

## 2023-11-27 DIAGNOSIS — R8271 Bacteriuria: Secondary | ICD-10-CM | POA: Insufficient documentation

## 2023-11-27 DIAGNOSIS — N200 Calculus of kidney: Secondary | ICD-10-CM | POA: Diagnosis present

## 2023-11-27 DIAGNOSIS — I10 Essential (primary) hypertension: Secondary | ICD-10-CM | POA: Insufficient documentation

## 2023-11-27 DIAGNOSIS — J069 Acute upper respiratory infection, unspecified: Secondary | ICD-10-CM | POA: Insufficient documentation

## 2023-11-27 DIAGNOSIS — R110 Nausea: Secondary | ICD-10-CM | POA: Diagnosis present

## 2023-11-27 DIAGNOSIS — N132 Hydronephrosis with renal and ureteral calculous obstruction: Secondary | ICD-10-CM | POA: Diagnosis not present

## 2023-11-27 LAB — CBC WITH DIFFERENTIAL/PLATELET
Abs Immature Granulocytes: 0.06 10*3/uL (ref 0.00–0.07)
Basophils Absolute: 0.1 10*3/uL (ref 0.0–0.1)
Basophils Relative: 1 %
Eosinophils Absolute: 0.4 10*3/uL (ref 0.0–0.5)
Eosinophils Relative: 3 %
HCT: 37.7 % (ref 36.0–46.0)
Hemoglobin: 12.6 g/dL (ref 12.0–15.0)
Immature Granulocytes: 1 %
Lymphocytes Relative: 18 %
Lymphs Abs: 2.4 10*3/uL (ref 0.7–4.0)
MCH: 30.6 pg (ref 26.0–34.0)
MCHC: 33.4 g/dL (ref 30.0–36.0)
MCV: 91.5 fL (ref 80.0–100.0)
Monocytes Absolute: 1.1 10*3/uL — ABNORMAL HIGH (ref 0.1–1.0)
Monocytes Relative: 8 %
Neutro Abs: 9.1 10*3/uL — ABNORMAL HIGH (ref 1.7–7.7)
Neutrophils Relative %: 69 %
Platelets: 345 10*3/uL (ref 150–400)
RBC: 4.12 MIL/uL (ref 3.87–5.11)
RDW: 12.7 % (ref 11.5–15.5)
WBC: 13.1 10*3/uL — ABNORMAL HIGH (ref 4.0–10.5)
nRBC: 0 % (ref 0.0–0.2)

## 2023-11-27 LAB — COMPREHENSIVE METABOLIC PANEL WITH GFR
ALT: 16 U/L (ref 0–44)
AST: 19 U/L (ref 15–41)
Albumin: 3.9 g/dL (ref 3.5–5.0)
Alkaline Phosphatase: 84 U/L (ref 38–126)
Anion gap: 11 (ref 5–15)
BUN: 16 mg/dL (ref 8–23)
CO2: 23 mmol/L (ref 22–32)
Calcium: 9.2 mg/dL (ref 8.9–10.3)
Chloride: 104 mmol/L (ref 98–111)
Creatinine, Ser: 0.84 mg/dL (ref 0.44–1.00)
GFR, Estimated: >60 mL/min (ref >60)
Glucose, Bld: 137 mg/dL — ABNORMAL HIGH (ref 70–99)
Potassium: 3.7 mmol/L (ref 3.5–5.1)
Sodium: 138 mmol/L (ref 135–145)
Total Bilirubin: 0.4 mg/dL (ref 0.0–1.2)
Total Protein: 6.7 g/dL (ref 6.5–8.1)

## 2023-11-27 LAB — URINALYSIS, ROUTINE W REFLEX MICROSCOPIC
Bilirubin Urine: NEGATIVE
Glucose, UA: NEGATIVE mg/dL
Ketones, ur: NEGATIVE mg/dL
Leukocytes,Ua: NEGATIVE
Nitrite: NEGATIVE
Protein, ur: NEGATIVE mg/dL
Specific Gravity, Urine: 1.015 (ref 1.005–1.030)
pH: 5 (ref 5.0–8.0)

## 2023-11-27 MED ORDER — ONDANSETRON 4 MG PO TBDP
4.0000 mg | ORAL_TABLET | Freq: Once | ORAL | Status: AC
  Administered 2023-11-28: 4 mg via ORAL
  Filled 2023-11-27: qty 1

## 2023-11-27 NOTE — ED Provider Notes (Signed)
 [facility] Provider Note    None    (approximate)   History   Nausea   HPI  Ashley Collins is a 63 y.o. female   Past medical history of kidney stones, hypertension who presents to the Emergency Department with nausea.  She states that she typically gets nauseated but does not suffer from flank pain or urinary symptoms when she does have kidney stones, and has had kidney stones that are quite large requiring intervention in the past.  She feels that her symptoms today of her vague nausea resemble her kidney stones symptoms for the past.  She denies any dysuria frequency fevers or chills.  No other acute medical complaints.  She does note that she started on steroids for a URI recently. Independent Historian contributed to assessment above: Husband at bedside corroborates information above  External Medical Documents Reviewed: Operative note from Dr. Osa Blase of urology from February 2025 for nephrolithiasis      Physical Exam   Triage Vital Signs: ED Triage Vitals  Encounter Vitals Group     BP 11/27/23 2202 (!) 168/96     Systolic BP Percentile --      Diastolic BP Percentile --      Pulse Rate 11/27/23 2202 (!) 109     Resp 11/27/23 2202 18     Temp 11/27/23 2202 98 F (36.7 C)     Temp src --      SpO2 11/27/23 2202 100 %     Weight 11/27/23 2200 115 lb (52.2 kg)     Height 11/27/23 2200 5\' 2"  (1.575 m)     Head Circumference --      Peak Flow --      Pain Score 11/27/23 2200 0     Pain Loc --      Pain Education --      Exclude from Growth Chart --     Most recent vital signs: Vitals:   11/27/23 2202 11/28/23 0034  BP: (!) 168/96 (!) 177/90  Pulse: (!) 109 92  Resp: 18 18  Temp: 98 F (36.7 C)   SpO2: 100%     General: Awake, no distress.  CV:  Good peripheral perfusion.  Resp:  Normal effort.  Abd:  No distention.  Other:  Well-appearing patient with hypertension otherwise vital signs normal.  Tachycardic in triage but  self resolved without any intervention when I interview her.  She is a soft benign abdominal exam and no CVA tenderness.  Appears comfortable and nontoxic.   ED Results / Procedures / Treatments   Labs (all labs ordered are listed, but only abnormal results are displayed) Labs Reviewed  CBC WITH DIFFERENTIAL/PLATELET - Abnormal; Notable for the following components:      Result Value   WBC 13.1 (*)    Neutro Abs 9.1 (*)    Monocytes Absolute 1.1 (*)    All other components within normal limits  URINALYSIS, ROUTINE W REFLEX MICROSCOPIC - Abnormal; Notable for the following components:   Color, Urine YELLOW (*)    APPearance HAZY (*)    Hgb urine dipstick MODERATE (*)    Bacteria, UA FEW (*)    All other components within normal limits  COMPREHENSIVE METABOLIC PANEL WITH GFR - Abnormal; Notable for the following components:   Glucose, Bld 137 (*)    All other components within normal limits  URINE CULTURE     I ordered and reviewed the above labs they are notable for white  blood cell count is 13 in the setting of steroid use recently.  There are few bacteria in the urine with less than 10 white blood cells   RADIOLOGY I independently reviewed and interpreted CT of the abdomen pelvis and see no obvious hydronephrosis I also reviewed radiologist's formal read.   PROCEDURES:  Critical Care performed: No  Procedures   MEDICATIONS ORDERED IN ED: Medications  ondansetron  (ZOFRAN -ODT) disintegrating tablet 4 mg (4 mg Oral Given 11/28/23 0002)  sulfamethoxazole -trimethoprim  (BACTRIM  DS) 800-160 MG per tablet 1 tablet (1 tablet Oral Given 11/28/23 0033)    IMPRESSION / MDM / ASSESSMENT AND PLAN / ED COURSE  I reviewed the triage vital signs and the nursing notes.                                Patient's presentation is most consistent with acute presentation with potential threat to life or bodily function.  Differential diagnosis includes, but is not limited to, urinary  tract infection, nephrolithiasis, intra-abdominal infection, adverse effect of steroid medication   The patient is on the cardiac monitor to evaluate for evidence of arrhythmia and/or significant heart rate changes.  MDM:    She has had a history of kidney stones and there is a small right-sided ureteral stone noted on CT scan today, similar to a study from February.  She has no pain.  She has no infectious symptoms like dysuria or frequency fevers or chills and her hemodynamics are appropriate reassuring she is nontoxic-appearing, however there a few bacteria in the urine with very minimal white blood cell which I do not think represent a large infection but given that she has a stone we will treat with antibiotics with first dose of Bactrim  tonight.  Will give expulsion therapy prescription.  She will follow-up with her urologist in the morning.  I considered hospitalization for admission or observation however given no pain, normal vital signs, nontoxic appearance doubt sepsis, and ability to follow-up with established urologist I think she can be treated outpatient and follow-up with return precautions.        FINAL CLINICAL IMPRESSION(S) / ED DIAGNOSES   Final diagnoses:  Bacteriuria  Kidney stone     Rx / DC Orders   ED Discharge Orders          Ordered    sulfamethoxazole -trimethoprim  (BACTRIM  DS) 800-160 MG tablet  2 times daily        11/28/23 0024    tamsulosin  (FLOMAX ) 0.4 MG CAPS capsule  Daily        11/28/23 0024             Note:  This document was prepared using Dragon voice recognition software and may include unintentional dictation errors.    Buell Carmin, MD 11/28/23 [phone]

## 2023-11-27 NOTE — ED Notes (Signed)
Pt at imaging.

## 2023-11-27 NOTE — ED Triage Notes (Addendum)
 Pt reports some nausea for the past few days, pt denies abd pain. Pt states she's been seen at PCP for URI and been taking prednisone. Pt reports she has hx of kidney stones presenting in same way. Denies dysuria or difficulty urinating.

## 2023-11-28 ENCOUNTER — Other Ambulatory Visit: Payer: Self-pay | Admitting: Urology

## 2023-11-28 ENCOUNTER — Encounter: Payer: Self-pay | Admitting: Urology

## 2023-11-28 ENCOUNTER — Ambulatory Visit: Admitting: Urology

## 2023-11-28 ENCOUNTER — Telehealth: Payer: Self-pay | Admitting: Urology

## 2023-11-28 ENCOUNTER — Ambulatory Visit
Admission: RE | Admit: 2023-11-28 | Discharge: 2023-11-28 | Disposition: A | Discharge status: Home / Self Care | Source: Home / Self Care | Attending: Urology | Admitting: Urology

## 2023-11-28 ENCOUNTER — Ambulatory Visit
Admission: RE | Admit: 2023-11-28 | Discharge: 2023-11-28 | Disposition: A | Discharge status: Home / Self Care | Source: Ambulatory Visit | Attending: Urology | Admitting: Urology

## 2023-11-28 VITALS — BP 101/64 | HR 87 | Temp 98.4°F | Ht 62.0 in | Wt 118.0 lb

## 2023-11-28 DIAGNOSIS — R110 Nausea: Secondary | ICD-10-CM

## 2023-11-28 DIAGNOSIS — N201 Calculus of ureter: Secondary | ICD-10-CM | POA: Diagnosis not present

## 2023-11-28 DIAGNOSIS — N200 Calculus of kidney: Secondary | ICD-10-CM

## 2023-11-28 MED ORDER — SULFAMETHOXAZOLE-TRIMETHOPRIM 800-160 MG PO TABS
1.0000 | ORAL_TABLET | Freq: Once | ORAL | Status: AC
  Administered 2023-11-28: 1 via ORAL
  Filled 2023-11-28: qty 1

## 2023-11-28 MED ORDER — SULFAMETHOXAZOLE-TRIMETHOPRIM 800-160 MG PO TABS: 1.0000 | ORAL_TABLET | Freq: Two times a day (BID) | ORAL | 0 refills | Status: AC

## 2023-11-28 MED ORDER — TAMSULOSIN HCL 0.4 MG PO CAPS: 0.4000 mg | ORAL_CAPSULE | Freq: Every day | ORAL | 0 refills | Status: DC

## 2023-11-28 MED ORDER — PROMETHAZINE HCL 25 MG PO TABS: 25.0000 mg | ORAL_TABLET | Freq: Four times a day (QID) | ORAL | 0 refills | Status: DC | PRN

## 2023-11-28 MED ORDER — TAMSULOSIN HCL 0.4 MG PO CAPS
0.4000 mg | ORAL_CAPSULE | Freq: Every day | ORAL | 3 refills | Status: DC
Start: 2023-11-28 — End: 2023-12-15

## 2023-11-28 NOTE — Progress Notes (Signed)
 11/28/2023 3:16 PM   Ashley Collins March 04, 1961 [id number]  Referring provider: Will Hare, NP [address] [place],  [place] [address]  Urological history: 1.  Nephrolithiasis -Stone composition of calcium oxalate -Bilateral ureteroscopy (2022)  -24-hour urine persistent low urine volume and low urine citrate (2023)  -Had been on indapamide , but she discontinued it -right URS (07/2023)   Chief Complaint  Patient presents with   Follow-up   HPI: Ashley Collins is a 63 y.o. female who presents today for kidney stone.   Previous records reviewed.   She was seen in the ED yesterday for nausea.   CT renal stone study identified a 4 mm stone is the distal right ureter just above the UVJ with hydronephrosis.   Her serum creatinine was 0.84.  WBC count of 13.1.  Urinalysis was yellow hazy, specific gravity 1.015, pH of 5.0, moderate heme, 11-20 RBCs, 6-10 WBCs, few bacteria, 0-5 squames and mucus present.  Urine culture is pending.  She was prescribed Bactrim  and Flomax .  She has not had any renal colic, but she is having nausea.  Patient denies any modifying or aggravating factors.  Patient denies any recent UTI's, gross hematuria, dysuria or suprapubic/flank pain.  Patient denies any fevers, chills or vomiting.    KUB the right distal stone is not visualized.  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   Cardiac arrhythmia    Nonspecific ST T wave changes on EKG   Chronic venous insufficiency of lower extremity    Complication of anesthesia    nausea and vomiting   DDD (degenerative disc disease), lumbosacral    Essential hypertension    Headache    History of kidney stones    Hyperlipidemia    Kidney stones    Migraines    Osteoporosis    PONV (postoperative nausea and vomiting)    Right ureteral stone    Vitamin D deficiency     Surgical History: Past Surgical History:  Procedure Laterality  Date   AUGMENTATION MAMMAPLASTY     CESAREAN SECTION     x 4   COLONOSCOPY WITH PROPOFOL  N/A 03/31/2022   Procedure: COLONOSCOPY WITH PROPOFOL ;  Surgeon: Quintin Buckle, DO;  [facility] ENDOSCOPY;  Service: Gastroenterology;  Laterality: N/A;   CYSTOSCOPY W/ RETROGRADES Bilateral 07/10/2020   Procedure: CYSTOSCOPY WITH RETROGRADE PYELOGRAM;  Surgeon: Lawerence Pressman, MD;  Location: [facility] ORS;  Service: Urology;  Laterality: Bilateral;   CYSTOSCOPY/URETEROSCOPY/HOLMIUM LASER/STENT PLACEMENT     CYSTOSCOPY/URETEROSCOPY/HOLMIUM LASER/STENT PLACEMENT Bilateral 07/10/2020   Procedure: CYSTOSCOPY/URETEROSCOPY/HOLMIUM LASER/STENT PLACEMENT;  Surgeon: Lawerence Pressman, MD;  Location: [facility] ORS;  Service: Urology;  Laterality: Bilateral;   CYSTOSCOPY/URETEROSCOPY/HOLMIUM LASER/STENT PLACEMENT Right 08/25/2023   Procedure: CYSTOSCOPY/URETEROSCOPY/HOLMIUM LASER;  Surgeon: Lawerence Pressman, MD;  Location: [facility] ORS;  Service: Urology;  Laterality: Right;   EXTRACORPOREAL SHOCK WAVE LITHOTRIPSY     x 10 plus   Eye Lift     FACIAL COSMETIC SURGERY     GANGLION CYST EXCISION Right 12/21/2021   Procedure: REMOVAL GANGLION OF WRIST;  Surgeon: Molli Angelucci, MD;  Location: [facility] ORS;  Service: Orthopedics;  Laterality: Right;   LIPOSUCTION     TONSILLECTOMY     TRIGGER FINGER RELEASE Right 12/21/2021   Procedure: RELEASE TRIGGER FINGER/A-1 PULLEY;  Surgeon: Molli Angelucci, MD;  Location: [facility] ORS;  Service: Orthopedics;  Laterality: Right;   URETEROSCOPY WITH HOLMIUM LASER  LITHOTRIPSY      Home Medications:  Allergies as of 11/28/2023       Reactions   Percocet [oxycodone -acetaminophen ] Itching   Pt tolerates both oxycodone  and tylenol  individually         Medication List        Accurate as of November 28, 2023  3:16 PM. If you have any questions, ask your nurse or doctor.          STOP taking these medications    hydrOXYzine  10 MG tablet Commonly known as: ATARAX  Stopped by: Leinaala Catanese   methocarbamol 500 MG tablet Commonly known as: ROBAXIN Stopped by: Lura Falor       TAKE these medications    Aimovig 140 MG/ML Soaj Generic drug: Erenumab-aooe Inject 140 mg as directed every 28 (twenty-eight) days.   amitriptyline  75 MG tablet Commonly known as: ELAVIL  Take 1 tablet (75 mg total) by mouth at bedtime. Got it from the primary care   amLODipine 5 MG tablet Commonly known as: NORVASC Take 2.5-5 mg by mouth as directed. Take 2.5 mg in the morning and 5 mg at night   BOTOX IM Inject 1 Dose into the muscle every 3 (three) months.   cyanocobalamin 1000 MCG/ML injection Commonly known as: VITAMIN B12 Inject 1,000 mcg into the muscle every 30 (thirty) days.   estradiol 1 MG tablet Commonly known as: ESTRACE Take 1 mg by mouth daily.   lamoTRIgine  25 MG tablet Commonly known as: LaMICtal  Take 1 tablet (25 mg total) by mouth daily.   LORazepam  0.5 MG tablet Commonly known as: ATIVAN  Take 1 tablet (0.5 mg total) by mouth as directed. Take 1 tablet up to 3-4 times a week only for severe anxiety attacks,pls limit use, 26 tablets must last 30 days   naratriptan 2.5 MG tablet Commonly known as: AMERGE Take 2.5 mg by mouth as needed for migraine.   ondansetron  4 MG disintegrating tablet Commonly known as: ZOFRAN -ODT Take 1 tablet (4 mg total) by mouth every 8 (eight) hours as needed for nausea or vomiting.   progesterone 100 MG capsule Commonly known as: PROMETRIUM Take 100 mg by mouth at bedtime.   promethazine  25 MG tablet Commonly known as: PHENERGAN  Take 25 mg by mouth every 8 (eight) hours as needed for vomiting or nausea. What changed: Another medication with the same name was added. Make sure you understand how and when to take each. Changed by: Matilde Son   promethazine  25 MG tablet Commonly known as: PHENERGAN  Take 1 tablet (25 mg total) by mouth every 6 (six) hours as needed for nausea or vomiting. What changed: You were  already taking a medication with the same name, and this prescription was added. Make sure you understand how and when to take each. Changed by: Kaila Devries   sulfamethoxazole -trimethoprim  800-160 MG tablet Commonly known as: BACTRIM  DS Take 1 tablet by mouth 2 (two) times daily for 7 days.   tamsulosin  0.4 MG Caps capsule Commonly known as: FLOMAX  Take 1 capsule (0.4 mg total) by mouth daily. What changed: Another medication with the same name was added. Make sure you understand how and when to take each. Changed by: Matilde Son   tamsulosin  0.4 MG Caps capsule Commonly known as: FLOMAX  Take 1 capsule (0.4 mg total) by mouth daily. What changed: You were already taking a medication with the same name, and this prescription was added. Make sure you understand how and when to take each. Changed by: Matilde Son  Vitamin D (Ergocalciferol) 1.25 MG (50000 UNIT) Caps capsule Commonly known as: DRISDOL Take 50,000 Units by mouth every Monday.   zolpidem  10 MG tablet Commonly known as: AMBIEN  Take 1 tablet (10 mg total) by mouth at bedtime.        Allergies:  Allergies  Allergen Reactions   Percocet [Oxycodone -Acetaminophen ] Itching    Pt tolerates both oxycodone  and tylenol  individually     Family History: Family History  Problem Relation Age of Onset   Drug abuse Daughter    Depression Daughter    Depression Son    Bladder Cancer Neg Hx    Kidney cancer Neg Hx    Prostate cancer Neg Hx     Social History:  reports that she has never smoked. She has never been exposed to tobacco smoke. She has never used smokeless tobacco. She reports that she does not currently use alcohol. She reports that she does not currently use drugs.  ROS: Pertinent ROS in HPI  Physical Exam: BP 101/64   Pulse 87   Temp 98.4 F (36.9 C) (Oral)   Ht 5\' 2"  (1.575 m)   Wt 118 lb (53.5 kg)   BMI 21.58 kg/m   Constitutional:  Well nourished. Alert and oriented, No acute  distress. HEENT:  AT, moist mucus membranes.  Trachea midline Cardiovascular: No clubbing, cyanosis, or edema. Respiratory: Normal respiratory effort, no increased work of breathing. Neurologic: Grossly intact, no focal deficits, moving all 4 extremities. Psychiatric: Normal mood and affect.    Laboratory Data: Lab Results  Component Value Date   WBC 13.1 (H) 11/27/2023   HGB 12.6 11/27/2023   HCT 37.7 11/27/2023   MCV 91.5 11/27/2023   PLT 345 11/27/2023   Lab Results  Component Value Date   CREATININE 0.84 11/27/2023   Lab Results  Component Value Date   AST 19 11/27/2023   Lab Results  Component Value Date   ALT 16 11/27/2023  Urinalysis See EPIC and HPI  I have reviewed the labs.   Pertinent Imaging: CLINICAL DATA:  Abdominal and flank pain with stone suspected. Previous history of kidney stones.   EXAM: CT ABDOMEN AND PELVIS WITHOUT CONTRAST   TECHNIQUE: Multidetector CT imaging of the abdomen and pelvis was performed following the standard protocol without IV contrast.   RADIATION DOSE REDUCTION: This exam was performed according to the departmental dose-optimization program which includes automated exposure control, adjustment of the mA and/or kV according to patient size and/or use of iterative reconstruction technique.   COMPARISON:  Abdominal radiographs 08/08/2023. CT abdomen and pelvis 08/02/2023   FINDINGS: Lower chest: Lung bases are clear. Bilateral breast implants. Small esophageal hiatal hernia.   Hepatobiliary: No focal liver abnormality is seen. No gallstones, gallbladder wall thickening, or biliary dilatation.   Pancreas: Unremarkable. No pancreatic ductal dilatation or surrounding inflammatory changes.   Spleen: Normal in size without focal abnormality.   Adrenals/Urinary Tract: No adrenal gland nodules. Bilateral intrarenal stones. Largest is on the left measuring 10 mm diameter. Right-sided hydronephrosis and hydroureter with a  4 mm stone in the distal right ureter just above the ureterovesical junction. Similar size and position of the stone compared to 08/02/2023. Bladder is normal.   Stomach/Bowel: Stomach is within normal limits. Appendix appears normal. No evidence of bowel wall thickening, distention, or inflammatory changes.   Vascular/Lymphatic: Aortic atherosclerosis. No enlarged abdominal or pelvic lymph nodes.   Reproductive: Uterus and bilateral adnexa are unremarkable.   Other: No abdominal wall hernia or abnormality. No  abdominopelvic ascites.   Musculoskeletal: No acute or significant osseous findings.   IMPRESSION: 1. 4 mm stone in the distal right ureter just above the ureterovesical junction with moderate proximal obstruction. This is similar to prior study of 08/02/2023. 2. Additional bilateral nonobstructing intrarenal stones. 3. Aortic atherosclerosis. 4. Small esophageal hiatal hernia.     Electronically Signed   By: Boyce Byes M.D.   On: 11/27/2023 23:57  I have independently reviewed the films.  See HPI.   Assessment & Plan:    1. Right ureteral stone - Explained that I cannot see the stone on KUB, therefore she is not a candidate for ESWL - Will go forward with medical expulsive therapy, but schedule ureteroscopy in 1 to 2 weeks any that she does not pass the stone we can go ahead and address it - explained to the patient how the procedure is performed and the risks involved -informed the patient that they will have an ureteral stent, which will remain in place for approximately 3-10 days and can be associated with flank pain, bladder pain, dysuria, urgency, frequency, urinary leakage, and gross hematuria. - stent may be removed in the office with a cystoscope or patient may be instructed to remove the stent themselves by the string and that is decided on the day of the procedure - residual stones within the kidney or ureter may be present after the procedure and may  need to have these addressed at a different encounter - injury to the ureter is the most common intra-operative risk, it may result in an open procedure to correct the defect - infection and bleeding are also risks - explained the risks of general anesthesia, such as: MI, CVA, paralysis, coma and/or death. - advised to contact our office or seek treatment in the ED if becomes febrile or pain/ vomiting are difficult control in order to arrange for emergent/urgent intervention    2.  Microscopic hematuria -UA w/ micro heme  -urine culture pending -Will reassess when she undergoes definitive treatment for her ureteral stone    Return for right urs/ll/ureteral stent placement .  These notes generated with voice recognition software. I apologize for typographical errors.  Briant Camper  [facility] [address]  [address] [place], [place] [address] [phone]  Surgical Physician Order Form [facility] [place]  * Scheduling expectation : 1 to 2 weeks w/ Dr. Estanislao Heimlich   *Length of Case:   *Clearance needed: no  *Anticoagulation Instructions: N/A  *Aspirin Instructions: N/A  *Post-op visit Date/Instructions:  TBD  *Diagnosis: Right Ureteral Stone  *Procedure: right Ureteroscopy w/laser lithotripsy & stent placement (24401)   Additional orders: N/A  -Admit type: OUTpatient  -Anesthesia: General  -VTE Prophylaxis Standing Order SCD's       Other:   -Standing Lab Orders Per Anesthesia    Lab other: None  -Standing Test orders EKG/Chest x-ray per Anesthesia       Test other:   - Medications:  Ancef  2gm IV  -Other orders:  N/A

## 2023-11-28 NOTE — Patient Instructions (Signed)
 If you passes the stone prior to the surgery, please let us  know. If her symptoms get worse, please let us  know.

## 2023-11-28 NOTE — Telephone Encounter (Signed)
 Ashley Collins calling patient back to schedule appt.

## 2023-11-28 NOTE — H&P (View-Only) (Signed)
 11/28/2023 3:16 PM   Ashley Collins March 04, 1961 [id number]  Referring provider: Will Hare, NP [address] [place],  [place] [address]  Urological history: 1.  Nephrolithiasis -Stone composition of calcium oxalate -Bilateral ureteroscopy (2022)  -24-hour urine persistent low urine volume and low urine citrate (2023)  -Had been on indapamide , but she discontinued it -right URS (07/2023)   Chief Complaint  Patient presents with   Follow-up   HPI: Ashley Collins is a 63 y.o. female who presents today for kidney stone.   Previous records reviewed.   She was seen in the ED yesterday for nausea.   CT renal stone study identified a 4 mm stone is the distal right ureter just above the UVJ with hydronephrosis.   Her serum creatinine was 0.84.  WBC count of 13.1.  Urinalysis was yellow hazy, specific gravity 1.015, pH of 5.0, moderate heme, 11-20 RBCs, 6-10 WBCs, few bacteria, 0-5 squames and mucus present.  Urine culture is pending.  She was prescribed Bactrim  and Flomax .  She has not had any renal colic, but she is having nausea.  Patient denies any modifying or aggravating factors.  Patient denies any recent UTI's, gross hematuria, dysuria or suprapubic/flank pain.  Patient denies any fevers, chills or vomiting.    KUB the right distal stone is not visualized.  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   Cardiac arrhythmia    Nonspecific ST T wave changes on EKG   Chronic venous insufficiency of lower extremity    Complication of anesthesia    nausea and vomiting   DDD (degenerative disc disease), lumbosacral    Essential hypertension    Headache    History of kidney stones    Hyperlipidemia    Kidney stones    Migraines    Osteoporosis    PONV (postoperative nausea and vomiting)    Right ureteral stone    Vitamin D deficiency     Surgical History: Past Surgical History:  Procedure Laterality  Date   AUGMENTATION MAMMAPLASTY     CESAREAN SECTION     x 4   COLONOSCOPY WITH PROPOFOL  N/A 03/31/2022   Procedure: COLONOSCOPY WITH PROPOFOL ;  Surgeon: Quintin Buckle, DO;  [facility] ENDOSCOPY;  Service: Gastroenterology;  Laterality: N/A;   CYSTOSCOPY W/ RETROGRADES Bilateral 07/10/2020   Procedure: CYSTOSCOPY WITH RETROGRADE PYELOGRAM;  Surgeon: Lawerence Pressman, MD;  Location: [facility] ORS;  Service: Urology;  Laterality: Bilateral;   CYSTOSCOPY/URETEROSCOPY/HOLMIUM LASER/STENT PLACEMENT     CYSTOSCOPY/URETEROSCOPY/HOLMIUM LASER/STENT PLACEMENT Bilateral 07/10/2020   Procedure: CYSTOSCOPY/URETEROSCOPY/HOLMIUM LASER/STENT PLACEMENT;  Surgeon: Lawerence Pressman, MD;  Location: [facility] ORS;  Service: Urology;  Laterality: Bilateral;   CYSTOSCOPY/URETEROSCOPY/HOLMIUM LASER/STENT PLACEMENT Right 08/25/2023   Procedure: CYSTOSCOPY/URETEROSCOPY/HOLMIUM LASER;  Surgeon: Lawerence Pressman, MD;  Location: [facility] ORS;  Service: Urology;  Laterality: Right;   EXTRACORPOREAL SHOCK WAVE LITHOTRIPSY     x 10 plus   Eye Lift     FACIAL COSMETIC SURGERY     GANGLION CYST EXCISION Right 12/21/2021   Procedure: REMOVAL GANGLION OF WRIST;  Surgeon: Molli Angelucci, MD;  Location: [facility] ORS;  Service: Orthopedics;  Laterality: Right;   LIPOSUCTION     TONSILLECTOMY     TRIGGER FINGER RELEASE Right 12/21/2021   Procedure: RELEASE TRIGGER FINGER/A-1 PULLEY;  Surgeon: Molli Angelucci, MD;  Location: [facility] ORS;  Service: Orthopedics;  Laterality: Right;   URETEROSCOPY WITH HOLMIUM LASER  LITHOTRIPSY      Home Medications:  Allergies as of 11/28/2023       Reactions   Percocet [oxycodone -acetaminophen ] Itching   Pt tolerates both oxycodone  and tylenol  individually         Medication List        Accurate as of November 28, 2023  3:16 PM. If you have any questions, ask your nurse or doctor.          STOP taking these medications    hydrOXYzine  10 MG tablet Commonly known as: ATARAX  Stopped by: Leinaala Catanese   methocarbamol 500 MG tablet Commonly known as: ROBAXIN Stopped by: Lura Falor       TAKE these medications    Aimovig 140 MG/ML Soaj Generic drug: Erenumab-aooe Inject 140 mg as directed every 28 (twenty-eight) days.   amitriptyline  75 MG tablet Commonly known as: ELAVIL  Take 1 tablet (75 mg total) by mouth at bedtime. Got it from the primary care   amLODipine 5 MG tablet Commonly known as: NORVASC Take 2.5-5 mg by mouth as directed. Take 2.5 mg in the morning and 5 mg at night   BOTOX IM Inject 1 Dose into the muscle every 3 (three) months.   cyanocobalamin 1000 MCG/ML injection Commonly known as: VITAMIN B12 Inject 1,000 mcg into the muscle every 30 (thirty) days.   estradiol 1 MG tablet Commonly known as: ESTRACE Take 1 mg by mouth daily.   lamoTRIgine  25 MG tablet Commonly known as: LaMICtal  Take 1 tablet (25 mg total) by mouth daily.   LORazepam  0.5 MG tablet Commonly known as: ATIVAN  Take 1 tablet (0.5 mg total) by mouth as directed. Take 1 tablet up to 3-4 times a week only for severe anxiety attacks,pls limit use, 26 tablets must last 30 days   naratriptan 2.5 MG tablet Commonly known as: AMERGE Take 2.5 mg by mouth as needed for migraine.   ondansetron  4 MG disintegrating tablet Commonly known as: ZOFRAN -ODT Take 1 tablet (4 mg total) by mouth every 8 (eight) hours as needed for nausea or vomiting.   progesterone 100 MG capsule Commonly known as: PROMETRIUM Take 100 mg by mouth at bedtime.   promethazine  25 MG tablet Commonly known as: PHENERGAN  Take 25 mg by mouth every 8 (eight) hours as needed for vomiting or nausea. What changed: Another medication with the same name was added. Make sure you understand how and when to take each. Changed by: Matilde Son   promethazine  25 MG tablet Commonly known as: PHENERGAN  Take 1 tablet (25 mg total) by mouth every 6 (six) hours as needed for nausea or vomiting. What changed: You were  already taking a medication with the same name, and this prescription was added. Make sure you understand how and when to take each. Changed by: Kaila Devries   sulfamethoxazole -trimethoprim  800-160 MG tablet Commonly known as: BACTRIM  DS Take 1 tablet by mouth 2 (two) times daily for 7 days.   tamsulosin  0.4 MG Caps capsule Commonly known as: FLOMAX  Take 1 capsule (0.4 mg total) by mouth daily. What changed: Another medication with the same name was added. Make sure you understand how and when to take each. Changed by: Matilde Son   tamsulosin  0.4 MG Caps capsule Commonly known as: FLOMAX  Take 1 capsule (0.4 mg total) by mouth daily. What changed: You were already taking a medication with the same name, and this prescription was added. Make sure you understand how and when to take each. Changed by: Matilde Son  Vitamin D (Ergocalciferol) 1.25 MG (50000 UNIT) Caps capsule Commonly known as: DRISDOL Take 50,000 Units by mouth every Monday.   zolpidem  10 MG tablet Commonly known as: AMBIEN  Take 1 tablet (10 mg total) by mouth at bedtime.        Allergies:  Allergies  Allergen Reactions   Percocet [Oxycodone -Acetaminophen ] Itching    Pt tolerates both oxycodone  and tylenol  individually     Family History: Family History  Problem Relation Age of Onset   Drug abuse Daughter    Depression Daughter    Depression Son    Bladder Cancer Neg Hx    Kidney cancer Neg Hx    Prostate cancer Neg Hx     Social History:  reports that she has never smoked. She has never been exposed to tobacco smoke. She has never used smokeless tobacco. She reports that she does not currently use alcohol. She reports that she does not currently use drugs.  ROS: Pertinent ROS in HPI  Physical Exam: BP 101/64   Pulse 87   Temp 98.4 F (36.9 C) (Oral)   Ht 5\' 2"  (1.575 m)   Wt 118 lb (53.5 kg)   BMI 21.58 kg/m   Constitutional:  Well nourished. Alert and oriented, No acute  distress. HEENT:  AT, moist mucus membranes.  Trachea midline Cardiovascular: No clubbing, cyanosis, or edema. Respiratory: Normal respiratory effort, no increased work of breathing. Neurologic: Grossly intact, no focal deficits, moving all 4 extremities. Psychiatric: Normal mood and affect.    Laboratory Data: Lab Results  Component Value Date   WBC 13.1 (H) 11/27/2023   HGB 12.6 11/27/2023   HCT 37.7 11/27/2023   MCV 91.5 11/27/2023   PLT 345 11/27/2023   Lab Results  Component Value Date   CREATININE 0.84 11/27/2023   Lab Results  Component Value Date   AST 19 11/27/2023   Lab Results  Component Value Date   ALT 16 11/27/2023  Urinalysis See EPIC and HPI  I have reviewed the labs.   Pertinent Imaging: CLINICAL DATA:  Abdominal and flank pain with stone suspected. Previous history of kidney stones.   EXAM: CT ABDOMEN AND PELVIS WITHOUT CONTRAST   TECHNIQUE: Multidetector CT imaging of the abdomen and pelvis was performed following the standard protocol without IV contrast.   RADIATION DOSE REDUCTION: This exam was performed according to the departmental dose-optimization program which includes automated exposure control, adjustment of the mA and/or kV according to patient size and/or use of iterative reconstruction technique.   COMPARISON:  Abdominal radiographs 08/08/2023. CT abdomen and pelvis 08/02/2023   FINDINGS: Lower chest: Lung bases are clear. Bilateral breast implants. Small esophageal hiatal hernia.   Hepatobiliary: No focal liver abnormality is seen. No gallstones, gallbladder wall thickening, or biliary dilatation.   Pancreas: Unremarkable. No pancreatic ductal dilatation or surrounding inflammatory changes.   Spleen: Normal in size without focal abnormality.   Adrenals/Urinary Tract: No adrenal gland nodules. Bilateral intrarenal stones. Largest is on the left measuring 10 mm diameter. Right-sided hydronephrosis and hydroureter with a  4 mm stone in the distal right ureter just above the ureterovesical junction. Similar size and position of the stone compared to 08/02/2023. Bladder is normal.   Stomach/Bowel: Stomach is within normal limits. Appendix appears normal. No evidence of bowel wall thickening, distention, or inflammatory changes.   Vascular/Lymphatic: Aortic atherosclerosis. No enlarged abdominal or pelvic lymph nodes.   Reproductive: Uterus and bilateral adnexa are unremarkable.   Other: No abdominal wall hernia or abnormality. No  abdominopelvic ascites.   Musculoskeletal: No acute or significant osseous findings.   IMPRESSION: 1. 4 mm stone in the distal right ureter just above the ureterovesical junction with moderate proximal obstruction. This is similar to prior study of 08/02/2023. 2. Additional bilateral nonobstructing intrarenal stones. 3. Aortic atherosclerosis. 4. Small esophageal hiatal hernia.     Electronically Signed   By: Boyce Byes M.D.   On: 11/27/2023 23:57  I have independently reviewed the films.  See HPI.   Assessment & Plan:    1. Right ureteral stone - Explained that I cannot see the stone on KUB, therefore she is not a candidate for ESWL - Will go forward with medical expulsive therapy, but schedule ureteroscopy in 1 to 2 weeks any that she does not pass the stone we can go ahead and address it - explained to the patient how the procedure is performed and the risks involved -informed the patient that they will have an ureteral stent, which will remain in place for approximately 3-10 days and can be associated with flank pain, bladder pain, dysuria, urgency, frequency, urinary leakage, and gross hematuria. - stent may be removed in the office with a cystoscope or patient may be instructed to remove the stent themselves by the string and that is decided on the day of the procedure - residual stones within the kidney or ureter may be present after the procedure and may  need to have these addressed at a different encounter - injury to the ureter is the most common intra-operative risk, it may result in an open procedure to correct the defect - infection and bleeding are also risks - explained the risks of general anesthesia, such as: MI, CVA, paralysis, coma and/or death. - advised to contact our office or seek treatment in the ED if becomes febrile or pain/ vomiting are difficult control in order to arrange for emergent/urgent intervention    2.  Microscopic hematuria -UA w/ micro heme  -urine culture pending -Will reassess when she undergoes definitive treatment for her ureteral stone    Return for right urs/ll/ureteral stent placement .  These notes generated with voice recognition software. I apologize for typographical errors.  Briant Camper  [facility] [address]  [address] [place], [place] [address] [phone]  Surgical Physician Order Form [facility] [place]  * Scheduling expectation : 1 to 2 weeks w/ Dr. Estanislao Heimlich   *Length of Case:   *Clearance needed: no  *Anticoagulation Instructions: N/A  *Aspirin Instructions: N/A  *Post-op visit Date/Instructions:  TBD  *Diagnosis: Right Ureteral Stone  *Procedure: right Ureteroscopy w/laser lithotripsy & stent placement (24401)   Additional orders: N/A  -Admit type: OUTpatient  -Anesthesia: General  -VTE Prophylaxis Standing Order SCD's       Other:   -Standing Lab Orders Per Anesthesia    Lab other: None  -Standing Test orders EKG/Chest x-ray per Anesthesia       Test other:   - Medications:  Ancef  2gm IV  -Other orders:  N/A

## 2023-11-28 NOTE — Telephone Encounter (Signed)
 Pt called and said she left a message on triage line earlier.  She thinks someone tried to call her back and her phone was not working right.

## 2023-11-28 NOTE — Discharge Instructions (Addendum)
 There was a small 4 mm kidney stone on the right side observed on your CT scan.  I prescribed Flomax  which can help expel the stone from your body.  There was a small amount of bacteria in the urine so you were started on an antibiotic that can help treat early urinary tract infection, which is especially important to take while you have a kidney stone.  Since you have a kidney stone and some bacteria in the urine is very important that you follow-up with Dr. Estanislao Heimlich of urology by giving them a call in the morning.  Thank you for choosing us  for your health care today!  Please see your primary doctor this week for a follow up appointment.   If you have any new, worsening, or unexpected symptoms call your doctor right away or come back to the emergency department for reevaluation.  It was my pleasure to care for you today.   Arron Large Margery Sheets, MD

## 2023-11-29 ENCOUNTER — Other Ambulatory Visit: Payer: Self-pay | Admitting: Urology

## 2023-11-29 ENCOUNTER — Other Ambulatory Visit: Payer: Self-pay

## 2023-11-29 ENCOUNTER — Telehealth: Payer: Self-pay

## 2023-11-29 DIAGNOSIS — N201 Calculus of ureter: Secondary | ICD-10-CM

## 2023-11-29 LAB — URINE CULTURE: Culture: <10000 — AB

## 2023-11-29 MED ORDER — HYDROCODONE-ACETAMINOPHEN 5-325 MG PO TABS: 1.0000 | ORAL_TABLET | Freq: Four times a day (QID) | ORAL | 0 refills | Status: DC | PRN

## 2023-11-29 NOTE — Progress Notes (Signed)
   [facility] Surgical Posting Form  Surgery Date: Date: 12/15/2023  Surgeon: Dr. Jay Meth, MD  Inpt ( No  )   Outpt (Yes)   Obs ( No  )   Diagnosis: N20.1 Right Ureteral Stone  -CPT: [phone]  Surgery: Right Ureteroscopy with Laser Lithotripsy and Stent Placement   Stop Anticoagulations: No  Cardiac/Medical/Pulmonary Clearance needed: no  *Orders entered into EPIC  Date: 11/29/23   *Case booked in [place]  Date: 11/29/23  *Notified pt of Surgery: Date: 11/29/23  PRE-OP UA & CX: no  *Placed into Prior Authorization Work [place] Date: 11/29/23  Assistant/laser/rep:No

## 2023-11-29 NOTE — Telephone Encounter (Signed)
 Per Dr. Estanislao Heimlich, Patient is to be scheduled for Right Ureteroscopy with Laser Lithotripsy and Stent Placement   Mrs. Dehaan was contacted and possible surgical dates were discussed, Friday June 20th, 2025 was agreed upon for surgery.   Patient was directed to call [phone] between 1-3pm the day before surgery to find out surgical arrival time.  Instructions were given not to eat or drink from midnight on the night before surgery and have a driver for the day of surgery. On the surgery day patient was instructed to enter through the Medical Mall entrance of [facility] report the Same Day Surgery desk.   Pre-Admit Testing will be in contact via phone to set up an interview with the anesthesia team to review your history and medications prior to surgery.   Reminder of this information was sent via MyChart to the patient.

## 2023-11-29 NOTE — Telephone Encounter (Signed)
 Patient is requesting pain medication due to her kidney stones.  Please advise.

## 2023-11-29 NOTE — Progress Notes (Signed)
 Surgical Physician Order Form [facility] [place]  * Scheduling expectation : 1 to 2 weeks w/ Dr. Estanislao Heimlich   *Length of Case:   *Clearance needed: no  *Anticoagulation Instructions: N/A  *Aspirin Instructions: N/A  *Post-op visit Date/Instructions:   TBD  *Diagnosis: Right Ureteral Stone  *Procedure: right Ureteroscopy w/laser lithotripsy & stent placement ([address])   Additional orders: N/A  -Admit type: OUTpatient  -Anesthesia: General  -VTE Prophylaxis Standing Order SCD's       Other:   -Standing Lab Orders Per Anesthesia    Lab other: None  -Standing Test orders EKG/Chest x-ray per Anesthesia       Test other:   - Medications:  Ancef  2gm IV  -Other orders:  N/A

## 2023-12-07 ENCOUNTER — Encounter
Admission: RE | Admit: 2023-12-07 | Discharge: 2023-12-07 | Disposition: A | Discharge status: Home / Self Care | Source: Ambulatory Visit | Attending: Urology | Admitting: Urology

## 2023-12-07 ENCOUNTER — Other Ambulatory Visit: Payer: Self-pay

## 2023-12-07 HISTORY — DX: Lymphedema, not elsewhere classified: I89.0

## 2023-12-07 HISTORY — DX: Deficiency of other specified B group vitamins: E53.8

## 2023-12-07 HISTORY — DX: Anxiety disorder, unspecified: F41.9

## 2023-12-07 NOTE — Patient Instructions (Signed)
 Your procedure is scheduled on:12-15-23 Friday Report to the Registration Desk on the 1st floor of the Medical Mall.Then proceed to the 2nd floor Surgery Desk To find out your arrival time, please call [phone] between 1PM - 3PM on:12-14-23 Thursday If your arrival time is 6:00 am, do not arrive before that time as the Medical Mall entrance doors do not open until 6:00 am.  REMEMBER: Instructions that are not followed completely may result in serious medical risk, up to and including death; or upon the discretion of your surgeon and anesthesiologist your surgery may need to be rescheduled.  Do not eat food OR drink any liquids after midnight the night before surgery.  No gum chewing or hard candies.  One week prior to surgery:Stop NOW (12-07-23) Stop Anti-inflammatories (NSAIDS) such as Advil, Aleve, Ibuprofen, Motrin, Naproxen, Naprosyn and Aspirin based products such as Excedrin, Goody's Powder, BC Powder. Stop ANY OVER THE COUNTER supplements until after surgery.  You may however, continue to take Tylenol  if needed for pain up until the day of surgery.  Continue taking all of your other prescription medications up until the day of surgery.  ON THE DAY OF SURGERY ONLY TAKE THESE MEDICATIONS WITH SIPS OF WATER : -you may take LORazepam  (ATIVAN ) if needed for anxiety -you may take HYDROcodone -acetaminophen  (NORCO/VICODIN) if needed for pain  No Alcohol for 24 hours before or after surgery.  No Smoking including e-cigarettes for 24 hours before surgery.  No chewable tobacco products for at least 6 hours before surgery.  No nicotine patches on the day of surgery.  Do not use any recreational drugs for at least a week (preferably 2 weeks) before your surgery.  Please be advised that the combination of cocaine and anesthesia may have negative outcomes, up to and including death. If you test positive for cocaine, your surgery will be cancelled.  On the morning of surgery brush your  teeth with toothpaste and water , you may rinse your mouth with mouthwash if you wish. Do not swallow any toothpaste or mouthwash.  Do not wear jewelry, make-up, hairpins, clips or nail polish.  For welded (permanent) jewelry: bracelets, anklets, waist bands, etc.  Please have this removed prior to surgery.  If it is not removed, there is a chance that hospital personnel will need to cut it off on the day of surgery.  Do not wear lotions, powders, or perfumes.   Do not shave body hair from the neck down 48 hours before surgery.  Contact lenses, hearing aids and dentures may not be worn into surgery.  Do not bring valuables to the hospital. [facility] is not responsible for any missing/lost belongings or valuables.   Notify your doctor if there is any change in your medical condition (cold, fever, infection).  Wear comfortable clothing (specific to your surgery type) to the hospital.  After surgery, you can help prevent lung complications by doing breathing exercises.  Take deep breaths and cough every 1-2 hours. Your doctor may order a device called an Incentive Spirometer to help you take deep breaths. When coughing or sneezing, hold a pillow firmly against your incision with both hands. This is called "splinting." Doing this helps protect your incision. It also decreases belly discomfort.  If you are being admitted to the hospital overnight, leave your suitcase in the car. After surgery it may be brought to your room.  In case of increased patient census, it may be necessary for you, the patient, to continue your postoperative care in the  Same Day Surgery department.  If you are being discharged the day of surgery, you will not be allowed to drive home. You will need a responsible individual to drive you home and stay with you for 24 hours after surgery.   If you are taking public transportation, you will need to have a responsible individual with you.  Please call the  Pre-admissions Testing Dept. at [phone] if you have any questions about these instructions.  Surgery Visitation Policy:  Patients having surgery or a procedure may have two visitors.  Children under the age of 43 must have an adult with them who is not the patient.

## 2023-12-07 NOTE — Progress Notes (Signed)
 Pt states she was taking off amlodipine yesterday by her pcp due to ankle swelling. PCP instructed pt to check her bp at home bid

## 2023-12-11 ENCOUNTER — Other Ambulatory Visit: Payer: Self-pay | Admitting: Dermatology

## 2023-12-14 MED ORDER — CHLORHEXIDINE GLUCONATE 0.12 % MT SOLN: 15.0000 mL | Freq: Once | OROMUCOSAL | Status: DC

## 2023-12-14 MED ORDER — CEFAZOLIN SODIUM-DEXTROSE 2-4 GM/100ML-% IV SOLN
2.0000 g | INTRAVENOUS | Status: AC
  Administered 2023-12-15: 2 g via INTRAVENOUS

## 2023-12-14 MED ORDER — ORAL CARE MOUTH RINSE
15.0000 mL | Freq: Once | OROMUCOSAL | Status: DC
Start: 2023-12-14 — End: 2023-12-15

## 2023-12-14 MED ORDER — LACTATED RINGERS IV SOLN: INTRAVENOUS | Status: DC

## 2023-12-15 ENCOUNTER — Encounter: Payer: Self-pay | Admitting: Urology

## 2023-12-15 ENCOUNTER — Ambulatory Visit

## 2023-12-15 ENCOUNTER — Encounter
Admission: RE | Disposition: A | Discharge status: Home / Self Care | Payer: Self-pay | Source: Home / Self Care | Attending: Urology

## 2023-12-15 ENCOUNTER — Ambulatory Visit
Admission: RE | Admit: 2023-12-15 | Discharge: 2023-12-15 | Disposition: A | Discharge status: Home / Self Care | Attending: Urology | Admitting: Urology

## 2023-12-15 ENCOUNTER — Other Ambulatory Visit: Payer: Self-pay

## 2023-12-15 ENCOUNTER — Ambulatory Visit: Admitting: Certified Registered"

## 2023-12-15 DIAGNOSIS — F419 Anxiety disorder, unspecified: Secondary | ICD-10-CM | POA: Diagnosis not present

## 2023-12-15 DIAGNOSIS — I10 Essential (primary) hypertension: Secondary | ICD-10-CM | POA: Diagnosis not present

## 2023-12-15 DIAGNOSIS — N132 Hydronephrosis with renal and ureteral calculous obstruction: Secondary | ICD-10-CM | POA: Diagnosis present

## 2023-12-15 DIAGNOSIS — N200 Calculus of kidney: Secondary | ICD-10-CM

## 2023-12-15 DIAGNOSIS — Z79899 Other long term (current) drug therapy: Secondary | ICD-10-CM | POA: Insufficient documentation

## 2023-12-15 DIAGNOSIS — N201 Calculus of ureter: Secondary | ICD-10-CM

## 2023-12-15 HISTORY — PX: CYSTOSCOPY/URETEROSCOPY/HOLMIUM LASER/STENT PLACEMENT: SHX6546

## 2023-12-15 SURGERY — CYSTOSCOPY/URETEROSCOPY/HOLMIUM LASER/STENT PLACEMENT
Anesthesia: General | Site: Ureter | Laterality: Right

## 2023-12-15 MED ORDER — ACETAMINOPHEN 10 MG/ML IV SOLN
INTRAVENOUS | Status: DC | PRN
  Administered 2023-12-15: 1000 mg via INTRAVENOUS

## 2023-12-15 MED ORDER — SODIUM CHLORIDE 0.9 % IR SOLN
Status: DC | PRN
  Administered 2023-12-15: 3000 mL

## 2023-12-15 MED ORDER — PHENYLEPHRINE 80 MCG/ML (10ML) SYRINGE FOR IV PUSH (FOR BLOOD PRESSURE SUPPORT)
PREFILLED_SYRINGE | INTRAVENOUS | Status: DC | PRN
  Administered 2023-12-15: 240 ug via INTRAVENOUS

## 2023-12-15 MED ORDER — ROCURONIUM BROMIDE 100 MG/10ML IV SOLN
INTRAVENOUS | Status: DC | PRN
  Administered 2023-12-15: 40 mg via INTRAVENOUS

## 2023-12-15 MED ORDER — ONDANSETRON HCL 4 MG/2ML IJ SOLN
INTRAMUSCULAR | Status: DC | PRN
  Administered 2023-12-15 (×2): 4 mg via INTRAVENOUS

## 2023-12-15 MED ORDER — MIDAZOLAM HCL 2 MG/2ML IJ SOLN
INTRAMUSCULAR | Status: AC
  Filled 2023-12-15: qty 2

## 2023-12-15 MED ORDER — DEXAMETHASONE SODIUM PHOSPHATE 10 MG/ML IJ SOLN
INTRAMUSCULAR | Status: DC | PRN
  Administered 2023-12-15: 10 mg via INTRAVENOUS

## 2023-12-15 MED ORDER — GLYCOPYRROLATE 0.2 MG/ML IJ SOLN
INTRAMUSCULAR | Status: DC | PRN
  Administered 2023-12-15: .2 mg via INTRAVENOUS

## 2023-12-15 MED ORDER — CHLORHEXIDINE GLUCONATE 0.12 % MT SOLN
OROMUCOSAL | Status: AC
Start: 2023-12-15 — End: 2023-12-15
  Filled 2023-12-15: qty 15

## 2023-12-15 MED ORDER — SUGAMMADEX SODIUM 200 MG/2ML IV SOLN
INTRAVENOUS | Status: DC | PRN
  Administered 2023-12-15: 200 mg via INTRAVENOUS

## 2023-12-15 MED ORDER — KETOROLAC TROMETHAMINE 30 MG/ML IJ SOLN
INTRAMUSCULAR | Status: DC | PRN
  Administered 2023-12-15: 15 mg via INTRAVENOUS

## 2023-12-15 MED ORDER — FENTANYL CITRATE (PF) 100 MCG/2ML IJ SOLN
INTRAMUSCULAR | Status: AC
  Filled 2023-12-15: qty 2

## 2023-12-15 MED ORDER — LIDOCAINE HCL (CARDIAC) PF 100 MG/5ML IV SOSY
PREFILLED_SYRINGE | INTRAVENOUS | Status: DC | PRN
  Administered 2023-12-15: 100 mg via INTRAVENOUS

## 2023-12-15 MED ORDER — CEFAZOLIN SODIUM-DEXTROSE 2-4 GM/100ML-% IV SOLN
INTRAVENOUS | Status: AC
  Filled 2023-12-15: qty 100

## 2023-12-15 MED ORDER — FENTANYL CITRATE (PF) 100 MCG/2ML IJ SOLN
INTRAMUSCULAR | Status: DC | PRN
  Administered 2023-12-15: 25 ug via INTRAVENOUS

## 2023-12-15 MED ORDER — HYDROCODONE-ACETAMINOPHEN 5-325 MG PO TABS: 1.0000 | ORAL_TABLET | Freq: Four times a day (QID) | ORAL | 0 refills | Status: DC | PRN

## 2023-12-15 MED ORDER — ONDANSETRON HCL 4 MG/2ML IJ SOLN: 4.0000 mg | Freq: Once | INTRAMUSCULAR | Status: DC | PRN

## 2023-12-15 MED ORDER — IOHEXOL 180 MG/ML  SOLN
INTRAMUSCULAR | Status: DC | PRN
  Administered 2023-12-15: 20 mL

## 2023-12-15 MED ORDER — MIDAZOLAM HCL 2 MG/2ML IJ SOLN
INTRAMUSCULAR | Status: DC | PRN
  Administered 2023-12-15: 2 mg via INTRAVENOUS

## 2023-12-15 MED ORDER — ACETAMINOPHEN 10 MG/ML IV SOLN
INTRAVENOUS | Status: AC
  Filled 2023-12-15: qty 100

## 2023-12-15 MED ORDER — PROPOFOL 10 MG/ML IV BOLUS
INTRAVENOUS | Status: DC | PRN
  Administered 2023-12-15: 100 mg via INTRAVENOUS

## 2023-12-15 MED ORDER — EPHEDRINE SULFATE-NACL 50-0.9 MG/10ML-% IV SOSY
PREFILLED_SYRINGE | INTRAVENOUS | Status: DC | PRN
  Administered 2023-12-15: 5 mg via INTRAVENOUS

## 2023-12-15 MED ORDER — FENTANYL CITRATE (PF) 100 MCG/2ML IJ SOLN: 25.0000 ug | INTRAMUSCULAR | Status: DC | PRN

## 2023-12-15 SURGICAL SUPPLY — 26 items
ADHESIVE MASTISOL STRL (MISCELLANEOUS) IMPLANT
BAG DRAIN SIEMENS DORNER NS (MISCELLANEOUS) ×1 IMPLANT
BAG PRESSURE INF REUSE 3000 (BAG) ×1 IMPLANT
BRUSH SCRUB EZ 4% CHG (MISCELLANEOUS) ×1 IMPLANT
CATH URET FLEX-TIP 2 LUMEN 10F (CATHETERS) IMPLANT
CATH URETL OPEN 5X70 (CATHETERS) IMPLANT
CNTNR URN SCR LID CUP LEK RST (MISCELLANEOUS) IMPLANT
DRAPE UTILITY 15X26 TOWEL STRL (DRAPES) ×1 IMPLANT
DRSG TEGADERM 2-3/8X2-3/4 SM (GAUZE/BANDAGES/DRESSINGS) IMPLANT
FIBER LASER MOSES 200 DFL (Laser) IMPLANT
FIBER LASER MOSES 365 DFL (Laser) IMPLANT
GLOVE BIOGEL PI IND STRL 7.5 (GLOVE) ×1 IMPLANT
GOWN STRL REUS W/ TWL LRG LVL3 (GOWN DISPOSABLE) ×1 IMPLANT
GOWN STRL REUS W/ TWL XL LVL3 (GOWN DISPOSABLE) ×1 IMPLANT
GUIDEWIRE STR DUAL SENSOR (WIRE) ×1 IMPLANT
KIT TURNOVER CYSTO (KITS) ×1 IMPLANT
PACK CYSTO AR (MISCELLANEOUS) ×1 IMPLANT
SET CYSTO W/LG BORE CLAMP LF (SET/KITS/TRAYS/PACK) ×1 IMPLANT
SHEATH NAVIGATOR HD 12/14X36 (SHEATH) IMPLANT
SOL .9 NS 3000ML IRR UROMATIC (IV SOLUTION) ×1 IMPLANT
STENT URET 6FRX24 CONTOUR (STENTS) IMPLANT
STENT URET 6FRX26 CONTOUR (STENTS) IMPLANT
SURGILUBE 2OZ TUBE FLIPTOP (MISCELLANEOUS) ×1 IMPLANT
SYR 10ML LL (SYRINGE) ×1 IMPLANT
VALVE UROSEAL ADJ ENDO (VALVE) IMPLANT
WATER STERILE IRR 500ML POUR (IV SOLUTION) ×1 IMPLANT

## 2023-12-15 NOTE — Interval H&P Note (Signed)
 UROLOGY H&P UPDATE  Agree with prior H&P dated 12/25/2023, 4 mm right distal ureteral stone and ongoing symptoms, failed trial of medical expulsive therapy.  She has extensive history of nephrolithiasis, has been noncompliant with indapamide  and recommendations for repeat 24-hour urine testing.  Cardiac: RRR Lungs: CTA bilaterally  Laterality: Right Procedure: Right ureteroscopy, laser lithotripsy, stent placement  Urine: Culture 6/2 no growth  We specifically discussed the risks ureteroscopy including bleeding, infection/sepsis, stent related symptoms including flank pain/urgency/frequency/incontinence/dysuria, ureteral injury, ureteral stricture, inability to access stone, or need for staged or additional procedures.   Ashley Pressman, MD 12/15/2023

## 2023-12-15 NOTE — Transfer of Care (Signed)
 Immediate Anesthesia Transfer of Care Note  Patient: Ashley Collins  Procedure(s) Performed: CYSTOSCOPY/URETEROSCOPY/HOLMIUM LASER (Right: Ureter)  Patient Location: PACU  Anesthesia Type:General  Level of Consciousness: awake, drowsy, and patient cooperative  Airway & Oxygen Therapy: Patient Spontanous Breathing and Patient connected to face mask oxygen  Post-op Assessment: Report given to RN and Post -op Vital signs reviewed and stable  Post vital signs: Reviewed and stable  Last Vitals:  Vitals Value Taken Time  BP 171/78 12/15/23 12:24  Temp    Pulse 97 12/15/23 12:26  Resp 17 12/15/23 12:26  SpO2 100 % 12/15/23 12:26  Vitals shown include unfiled device data.  Last Pain:  Vitals:   12/15/23 1107  TempSrc: Temporal  PainSc: 0-No pain         Complications: No notable events documented.

## 2023-12-15 NOTE — Anesthesia Postprocedure Evaluation (Signed)
 Anesthesia Post Note  Patient: Ashley Collins  Procedure(s) Performed: CYSTOSCOPY/URETEROSCOPY/HOLMIUM LASER (Right: Ureter)  Patient location during evaluation: PACU Anesthesia Type: General Level of consciousness: awake Pain management: pain level controlled Vital Signs Assessment: post-procedure vital signs reviewed and stable Respiratory status: nonlabored ventilation Cardiovascular status: blood pressure returned to baseline Anesthetic complications: no   No notable events documented.   Last Vitals:  Vitals:   12/15/23 1245 12/15/23 1320  BP: (!) 153/81 (!) 171/84  Pulse: 88 80  Resp: 14 15  Temp: 36.7 C (!) 36.3 C  SpO2: 96% 99%    Last Pain:  Vitals:   12/15/23 1320  TempSrc: Temporal  PainSc: 0-No pain                 VAN STAVEREN,Sundiata Ferrick

## 2023-12-15 NOTE — Op Note (Signed)
 Date of procedure: 12/15/23  Preoperative diagnosis:  Right ureteral stone Right renal stone  Postoperative diagnosis:  Right renal stone  Procedure: Cystoscopy, right ureteroscopy, laser lithotripsy of renal stone, right retrograde pyelogram with intraoperative interpretation  Surgeon: Jay Meth, MD  Anesthesia: General  Complications: None  Intraoperative findings:  No evidence of right ureteral stone, right ureter normal, no evidence of stricture Small 3 mm right lower pole stone dusted Excellent efflux from the right ureter, no stent placed  EBL: Minimal  Specimens: None  Drains: None  Indication: Ashley Collins is a 63 y.o. patient with recurrent nephrolithiasis, as well as history of impacted large right distal ureteral stones in 2022.  She presented with nausea and CT suggesting a 4 mm right distal ureteral stone and opted for ureteroscopy.  Notably, she underwent right ureteroscopy in February 2025 when CT suggested a 6 mm right distal ureteral stone, and ureteroscopy was negative at that time with normal right ureter and no evidence of ureteral stone, this was felt to have passed spontaneously.  Unclear if this may represent a fragment extraluminally from her prior impacted stone in 2022 with chronic mild right hydronephrosis.  After reviewing the management options for treatment, they elected to proceed with the above surgical procedure(s). We have discussed the potential benefits and risks of the procedure, side effects of the proposed treatment, the likelihood of the patient achieving the goals of the procedure, and any potential problems that might occur during the procedure or recuperation. Informed consent has been obtained.  Description of procedure:  The patient was taken to the operating room and general anesthesia was induced. SCDs were placed for DVT prophylaxis. The patient was placed in the dorsal lithotomy position, prepped and draped in the usual sterile  fashion, and preoperative antibiotics were administered. A preoperative time-out was performed.   A 21 French rigid cystoscope was used to intubate the urethra and thorough cystoscopy was performed.  The bladder was grossly normal.  A sensor wire was used to intubate the right ureteral orifice and passed up to the kidney under fluoroscopic vision.  A semirigid long ureteroscope was advanced alongside the wire in the distal and mid ureter were grossly normal with no evidence of stone, erythema, or stricture.  A retrograde pyelogram was performed from the distal ureter which showed some mild hydronephrosis but no filling defects.  The scope was advanced all the way up to the proximal ureter and no evidence of stones seen.  A digital single-channel flexible ureteroscope was then advanced over the wire up to the kidney under fluoroscopic vision.  Thorough pyeloscopy revealed a 3 mm stone in the lower pole.  A 200 m laser fiber on settings of 0.5 J and 80 Hz was used to methodically dust the stone.  All fragments were smaller than the laser fiber.  A retrograde pyelogram was performed from the proximal ureter and showed no extravasation or filling defects.  Careful pullback ureteroscopy showed no evidence of ureteral injury or stones.  The rigid cystoscope was replaced and there was excellent efflux of urine from the right ureter and stent placement deferred.  Disposition: Stable to PACU  Plan: -RTC 6 months K[facility] -If evidence of persistent calculi on CT near area of distal ureter high suspicion for extraluminal calcification from prior impacted stones in 2022, but ureter is grossly normal with no evidence of stricture or abnormality  Jay Meth, MD

## 2023-12-15 NOTE — Anesthesia Procedure Notes (Signed)
 Procedure Name: Intubation Date/Time: 12/15/2023 11:57 AM  Performed by: Niki Barter, CRNAPre-anesthesia Checklist: Patient identified, Emergency Drugs available, Suction available and Patient being monitored Patient Re-evaluated:Patient Re-evaluated prior to induction Oxygen Delivery Method: Circle system utilized Preoxygenation: Pre-oxygenation with 100% oxygen Induction Type: IV induction Ventilation: Mask ventilation without difficulty Laryngoscope Size: McGrath and 3 Grade View: Grade I Tube type: Oral Tube size: 6.5 mm Number of attempts: 1 Airway Equipment and Method: Stylet Placement Confirmation: ETT inserted through vocal cords under direct vision, positive ETCO2 and breath sounds checked- equal and bilateral Secured at: 20 cm Tube secured with: Tape Dental Injury: Teeth and Oropharynx as per pre-operative assessment

## 2023-12-15 NOTE — Anesthesia Preprocedure Evaluation (Signed)
 Anesthesia Evaluation  Patient identified by MRN, date of birth, ID band Patient awake    Reviewed: Allergy & Precautions, NPO status , Patient's Chart, lab work & pertinent test results  History of Anesthesia Complications (+) PONV and history of anesthetic complications  Airway Mallampati: II  TM Distance: >3 FB Neck ROM: Full    Dental  (+) Teeth Intact   Pulmonary neg pulmonary ROS   Pulmonary exam normal breath sounds clear to auscultation       Cardiovascular Exercise Tolerance: Good hypertension, Pt. 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 No date: Cardiac arrhythmia     Comment:  Nonspecific ST T wave changes on EKG No date: Chronic venous insufficiency of lower extremity No date: Complication of anesthesia     Comment:  nausea and vomiting No date: DDD (degenerative disc disease), lumbosacral No date: Essential hypertension No date: Headache No date: History of kidney stones No date: Hyperlipidemia No date: Kidney stones No date: Lymphedema No date: Migraines No date: Osteoporosis No date: PONV (postoperative nausea and vomiting) No date: Right ureteral stone No date: Vitamin B12 deficiency No date: Vitamin D deficiency  Past Surgical History: No date: AUGMENTATION MAMMAPLASTY No date: CESAREAN SECTION     Comment:  x 4 03/31/2022: COLONOSCOPY WITH PROPOFOL ; N/A      Comment:  Procedure: COLONOSCOPY WITH PROPOFOL ;  Surgeon: Quintin Buckle, DO;  Location: [facility];  Service:               Gastroenterology;  Laterality: N/A; 07/10/2020: CYSTOSCOPY W/ RETROGRADES; Bilateral     Comment:  Procedure: CYSTOSCOPY WITH RETROGRADE PYELOGRAM;                Surgeon: Lawerence Pressman, MD;  Location: [facility];                Service: Urology;  Laterality: Bilateral; No date: CYSTOSCOPY/URETEROSCOPY/HOLMIUM LASER/STENT PLACEMENT 07/10/2020: CYSTOSCOPY/URETEROSCOPY/HOLMIUM LASER/STENT PLACEMENT;  Bilateral     Comment:  Procedure: CYSTOSCOPY/URETEROSCOPY/HOLMIUM LASER/STENT               PLACEMENT;  Surgeon: Lawerence Pressman, MD;  Location:               [facility];  Service: Urology;  Laterality: Bilateral; 08/25/2023: CYSTOSCOPY/URETEROSCOPY/HOLMIUM LASER/STENT PLACEMENT;  Right     Comment:  Procedure: CYSTOSCOPY/URETEROSCOPY/HOLMIUM LASER;                Surgeon: Lawerence Pressman, MD;  Location: [facility];                Service: Urology;  Laterality: Right; No date: EXTRACORPOREAL SHOCK WAVE LITHOTRIPSY     Comment:  x 10 plus No date: Eye Lift No date: FACIAL COSMETIC SURGERY 12/21/2021: GANGLION CYST EXCISION; Right     Comment:  Procedure: REMOVAL GANGLION OF WRIST;  Surgeon:  Molli Angelucci, MD;  Location: [facility];  Service: Orthopedics;               Laterality: Right; No date: LIPOSUCTION No date: TONSILLECTOMY 12/21/2021: TRIGGER FINGER RELEASE; Right     Comment:  Procedure: RELEASE TRIGGER FINGER/A-1 PULLEY;  Surgeon:               Molli Angelucci, MD;  Location: [facility];  Service:               Orthopedics;  Laterality: Right; No date: URETEROSCOPY WITH HOLMIUM LASER LITHOTRIPSY  BMI    Body Mass Index: 21.03 kg/m      Reproductive/Obstetrics negative OB ROS                             Anesthesia Physical Anesthesia Plan  ASA: 2  Anesthesia Plan: General   Post-op Pain  Management:    Induction: Intravenous  PONV Risk Score and Plan: Ondansetron , Dexamethasone , Midazolam  and Treatment may vary due to age or medical condition  Airway Management Planned: Oral ETT  Additional Equipment:   Intra-op Plan:   Post-operative Plan: Extubation in OR  Informed Consent: I have reviewed the patients History and Physical, chart, labs and discussed the procedure including the risks, benefits and alternatives for the proposed anesthesia with the patient or authorized representative who has indicated his/her understanding and acceptance.     Dental Advisory Given  Plan Discussed with: CRNA and Surgeon  Anesthesia Plan Comments:        Anesthesia Quick Evaluation

## 2023-12-16 ENCOUNTER — Encounter: Payer: Self-pay | Admitting: Urology

## 2023-12-18 ENCOUNTER — Other Ambulatory Visit: Payer: Self-pay

## 2023-12-18 ENCOUNTER — Observation Stay
Admission: EM | Admit: 2023-12-18 | Discharge: 2023-12-20 | Disposition: A | Discharge status: Home / Self Care | Attending: Family Medicine | Admitting: Family Medicine

## 2023-12-18 ENCOUNTER — Emergency Department

## 2023-12-18 DIAGNOSIS — I5042 Chronic combined systolic (congestive) and diastolic (congestive) heart failure: Secondary | ICD-10-CM | POA: Insufficient documentation

## 2023-12-18 DIAGNOSIS — F411 Generalized anxiety disorder: Secondary | ICD-10-CM | POA: Insufficient documentation

## 2023-12-18 DIAGNOSIS — I10 Essential (primary) hypertension: Secondary | ICD-10-CM | POA: Insufficient documentation

## 2023-12-18 DIAGNOSIS — H538 Other visual disturbances: Secondary | ICD-10-CM | POA: Diagnosis not present

## 2023-12-18 DIAGNOSIS — G43109 Migraine with aura, not intractable, without status migrainosus: Secondary | ICD-10-CM | POA: Diagnosis not present

## 2023-12-18 DIAGNOSIS — H539 Unspecified visual disturbance: Principal | ICD-10-CM

## 2023-12-18 DIAGNOSIS — R55 Syncope and collapse: Secondary | ICD-10-CM

## 2023-12-18 DIAGNOSIS — I110 Hypertensive heart disease with heart failure: Secondary | ICD-10-CM | POA: Insufficient documentation

## 2023-12-18 DIAGNOSIS — E785 Hyperlipidemia, unspecified: Secondary | ICD-10-CM | POA: Diagnosis not present

## 2023-12-18 DIAGNOSIS — D72829 Elevated white blood cell count, unspecified: Secondary | ICD-10-CM | POA: Insufficient documentation

## 2023-12-18 DIAGNOSIS — I63532 Cerebral infarction due to unspecified occlusion or stenosis of left posterior cerebral artery: Secondary | ICD-10-CM | POA: Diagnosis not present

## 2023-12-18 DIAGNOSIS — E876 Hypokalemia: Secondary | ICD-10-CM | POA: Diagnosis not present

## 2023-12-18 DIAGNOSIS — I639 Cerebral infarction, unspecified: Secondary | ICD-10-CM | POA: Diagnosis present

## 2023-12-18 DIAGNOSIS — N179 Acute kidney failure, unspecified: Secondary | ICD-10-CM | POA: Insufficient documentation

## 2023-12-18 LAB — CBC WITH DIFFERENTIAL/PLATELET
Abs Immature Granulocytes: 0.07 10*3/uL (ref 0.00–0.07)
Basophils Absolute: 0.1 10*3/uL (ref 0.0–0.1)
Basophils Relative: 1 %
Eosinophils Absolute: 0.1 10*3/uL (ref 0.0–0.5)
Eosinophils Relative: 1 %
HCT: 37.7 % (ref 36.0–46.0)
Hemoglobin: 12.3 g/dL (ref 12.0–15.0)
Immature Granulocytes: 1 %
Lymphocytes Relative: 20 %
Lymphs Abs: 2.6 10*3/uL (ref 0.7–4.0)
MCH: 30 pg (ref 26.0–34.0)
MCHC: 32.6 g/dL (ref 30.0–36.0)
MCV: 92 fL (ref 80.0–100.0)
Monocytes Absolute: 1.2 10*3/uL — ABNORMAL HIGH (ref 0.1–1.0)
Monocytes Relative: 9 %
Neutro Abs: 9.1 10*3/uL — ABNORMAL HIGH (ref 1.7–7.7)
Neutrophils Relative %: 68 %
Platelets: 349 10*3/uL (ref 150–400)
RBC: 4.1 MIL/uL (ref 3.87–5.11)
RDW: 12.8 % (ref 11.5–15.5)
WBC: 13.1 10*3/uL — ABNORMAL HIGH (ref 4.0–10.5)
nRBC: 0 % (ref 0.0–0.2)

## 2023-12-18 LAB — COMPREHENSIVE METABOLIC PANEL WITH GFR
ALT: 17 U/L (ref 0–44)
AST: 26 U/L (ref 15–41)
Albumin: 4 g/dL (ref 3.5–5.0)
Alkaline Phosphatase: 70 U/L (ref 38–126)
Anion gap: 11 (ref 5–15)
BUN: 21 mg/dL (ref 8–23)
CO2: 22 mmol/L (ref 22–32)
Calcium: 9.3 mg/dL (ref 8.9–10.3)
Chloride: 105 mmol/L (ref 98–111)
Creatinine, Ser: 1.63 mg/dL — ABNORMAL HIGH (ref 0.44–1.00)
GFR, Estimated: 35 mL/min — ABNORMAL LOW (ref >60)
Glucose, Bld: 121 mg/dL — ABNORMAL HIGH (ref 70–99)
Potassium: 3.3 mmol/L — ABNORMAL LOW (ref 3.5–5.1)
Sodium: 138 mmol/L (ref 135–145)
Total Bilirubin: <0.2 mg/dL (ref 0.0–1.2)
Total Protein: 6.6 g/dL (ref 6.5–8.1)

## 2023-12-18 LAB — PROTIME-INR
INR: 1.1 (ref 0.8–1.2)
Prothrombin Time: 14.4 s (ref 11.4–15.2)

## 2023-12-18 LAB — TROPONIN I (HIGH SENSITIVITY): Troponin I (High Sensitivity): 4 ng/L (ref <18)

## 2023-12-18 LAB — CBG MONITORING, ED: Glucose-Capillary: 122 mg/dL — ABNORMAL HIGH (ref 70–99)

## 2023-12-18 LAB — APTT: aPTT: 26 s (ref 24–36)

## 2023-12-18 MED ORDER — SODIUM CHLORIDE 0.9 % IV BOLUS (SEPSIS)
1000.0000 mL | Freq: Once | INTRAVENOUS | Status: AC
  Administered 2023-12-18: 1000 mL via INTRAVENOUS

## 2023-12-18 MED ORDER — ONDANSETRON HCL 4 MG/2ML IJ SOLN
4.0000 mg | Freq: Once | INTRAMUSCULAR | Status: AC
  Administered 2023-12-18: 4 mg via INTRAVENOUS
  Filled 2023-12-18: qty 2

## 2023-12-18 MED ORDER — PROCHLORPERAZINE EDISYLATE 10 MG/2ML IJ SOLN
10.0000 mg | INTRAMUSCULAR | Status: AC
  Administered 2023-12-19: 10 mg via INTRAVENOUS
  Filled 2023-12-18: qty 2

## 2023-12-18 MED ORDER — KETOROLAC TROMETHAMINE 30 MG/ML IJ SOLN
30.0000 mg | Freq: Once | INTRAMUSCULAR | Status: AC
  Administered 2023-12-19: 30 mg via INTRAVENOUS
  Filled 2023-12-18: qty 1

## 2023-12-18 MED ORDER — ACETAMINOPHEN 10 MG/ML IV SOLN
1000.0000 mg | Freq: Once | INTRAVENOUS | Status: AC
  Filled 2023-12-18 (×2): qty 100

## 2023-12-18 MED ORDER — SODIUM CHLORIDE 0.9% FLUSH
3.0000 mL | Freq: Once | INTRAVENOUS | Status: AC
  Administered 2023-12-18: 3 mL via INTRAVENOUS

## 2023-12-18 MED ORDER — TENECTEPLASE FOR STROKE
PACK | INTRAVENOUS | Status: AC
  Filled 2023-12-18: qty 10

## 2023-12-18 MED ORDER — DIPHENHYDRAMINE HCL 50 MG/ML IJ SOLN
25.0000 mg | INTRAMUSCULAR | Status: AC
  Administered 2023-12-19: 25 mg via INTRAVENOUS
  Filled 2023-12-18: qty 1

## 2023-12-18 MED ORDER — ASPIRIN 325 MG PO TBEC
325.0000 mg | DELAYED_RELEASE_TABLET | Freq: Once | ORAL | Status: AC
  Administered 2023-12-19: 325 mg via ORAL
  Filled 2023-12-18: qty 1

## 2023-12-18 NOTE — ED Notes (Signed)
 Called to [facility] @ 1040 PM per Charge RN Vanessa/Activate Code Stroke/Rep [employer].

## 2023-12-18 NOTE — Consult Note (Signed)
 [facility] Consult [facility]   Patient Name:   Ashley Collins, Ashley Collins Date of Birth:   02-01-1961 Identification Number:   MRN - [id number] Date of Service:   12/18/2023 22:47:21  Diagnosis:       H53.8 - Blurred Vision  Impression:      This consult was conducted in real-time using interactive audio and video technology. Patient was informed of the technology being used for this visit and agreed to proceed. Patient located in hospital and provider located at home/office setting.    This is a 63 year old F withmigraines, depression/anxiety, HTN who presents to [facility] [place] [place] at 12/18/2023 22:54:19 for complaints of near syncope, right eye blurred, general weakness, lightheaded, diaphoretic, headache.    She reports being last normal around 20:30 on December 18, 2023. While at work, she began to experience lightheadedness, feeling as though she might pass out, along with diaphoresis and general weakness. She also reported right eye blurriness. She also reports having had a headache for the past few days, which she notes is different from her usual migraines.    Upon further discussion, the patient's description of her visual symptoms is inconsistent. Initially, she reported blurred vision only in the right eye, but later stated that both eyes have a field cut. Notably, the described field cut is bilateral nasal, which is not a common physiologic localization of field cut.    The patient also reports having experienced genitourinary bleeding and undergoing kidney stone removal approximately 3-4 days ago.    Although the patient believes her current symptoms are related to her blood pressure, her blood pressure today is well-controlled at 140 systolic. She reports that her home blood pressure readings over the past few days have been in the range of 170s-180s systolic, suggesting that her symptoms are occurring despite improved  blood pressure control, making blood pressure an unlikely etiology for her current presentation.    Stroke can cause a visual field cut, and so is considered on the differential, however the fluctuating description and bi-nasal distribution argues against stroke, unless patient is simply a poor historian to this regard, and thus it remains on the differential. Although patient says this feels different than a typical migraine, complicated migraine - ocular variant is still high on the differential as well, as migraines, particularly variant types, do not always feel the same, and this may explain the fluctuations. The general weakness and diaphoresis are not directly explained by either etiology, so additional workup is still required. Also on the differential is presyncope and malnutrition (per report in chart, husband reported patient does not eat much). Given her recent GU bleed and procedure, this is sometimes considered an absolute contraindication to tPA/TNK, and sometimes considered a relative contraindication, depending on the guidelines to which you subscribe. As such, I did have an honest discussion with the patient about tPA/TNK. The risks, including possible hemorrhage and death, and the possible benefits were discussed. She expressed understanding. She demonstrated good decision-making capacity. I explained the above points about alternate possible diagnosis, diagnosis not yet confirmed, and the GU bleeding. I asked her thoughts on all of this. She reports at this time she would not want this medication, given the possible risks, and points above.   Stat MRI was obtained by ED. Small left PCA territory scattered ischemia was seen. Recommendations below.   Recommendations:        Neuro Checks (Q4)        Euglycemia and  Avoid Hyperthermia (PRN Acetaminophen )        Aspirin 325 mg once now, then 81 mg daily for now.        Plavix 300 mg once then 75 mg daily for now.         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         HbA1C, Lipid Panel, TSH.        PRN q6h "Headache Cocktails" consisting of the following, given at the same time: 1000 mg IV acetaminophen  (if available) or 1000 mg PO acetaminophen , 25 mg IV diphenhydramine , and 10 mg IV prochlorperazine.        Vessel imaging with CTA head and neck with contrast       (I reviewed MRI after completion, and Spoke with ED provider again with above updates. Patient is being admitted.)  ------------------------------------------------------------------------------ Advanced Imaging: CTA: Not ordered by primary team. Current physical exam at the time of my assessment is not highly suggestive of an acute surgically intervenable large vessel occlusion (NIHSS 0 or 1). Should the physical exam worsen in the future, please obtain advanced imaging (CTA) and also urgently notify [facility], the neurology team, and/or call a new Stroke Alert.  Metrics: Last Known Well: 12/18/2023 20:30:00 Dispatch Time: 12/18/2023 22:47:21 Arrival Time: 12/18/2023 22:10:00 Initial Response Time: 12/18/2023 22:53:41 Symptoms: near syncope, right eye blurred, general weakness, lightheaded, diaphoretic, headache. Initial patient interaction: 12/18/2023 22:53:41 NIHSS Assessment Completed: 12/18/2023 23:03:00 Patient is not a candidate for Thrombolytic. Thrombolytic Medical Decision: 12/18/2023 23:03:00 Patient was not deemed candidate for Thrombolytic because of following reasons: Recent gastrointestinal or urinary tract hemorrhage (within previous 21 days) . Stroke severity too mild (non-disabling) . Patient/Family declined .  CT Head: I personally reviewed all the CT images that were available to me and it showed: no acute pathology  Primary  Provider Notified of Diagnostic Impression and Management Plan on: 12/18/2023 23:27:06    ------------------------------------------------------------------------------  History of Present Illness: Patient is a 63 year old Female.  Patient was brought by private transportation with symptoms of near syncope, right eye blurred, general weakness, lightheaded, diaphoretic, headache. This consult was conducted in real-time using interactive audio and video technology. Patient was informed of the technology being used for this visit and agreed to proceed. Patient located in hospital and provider located at home/office setting.  This is a 63 year old F withmigraines, depression/anxiety, HTN who presents to [facility] [place], [place] at 12/18/2023 22:54:19 for complaints of near syncope, right eye blurred, general weakness, lightheaded, diaphoretic, headache.  She reports being last normal around 20:30 on December 18, 2023. While at work, she began to experience lightheadedness, feeling as though she might pass out, along with diaphoresis and general weakness. She also reported right eye blurriness. She also reports having had a headache for the past few days, which she notes is different from her usual migraines.  Upon further discussion, the patient's description of her visual symptoms is inconsistent. Initially, she reported blurred vision only in the right eye, but later stated that both eyes have a field cut. Notably, the  described field cut is bilateral nasal, which is not a common physiologic localization of field cut.  The patient also reports having experienced genitourinary bleeding and undergoing kidney stone removal approximately 3-4 days ago.  Although the patient believes her current symptoms are related to her blood pressure, her blood pressure today is well-controlled at 140 systolic. She reports that her home blood pressure readings over the past few days  have been in the range of 170s-180s systolic, suggesting that her symptoms are occurring despite improved blood pressure control, making blood pressure an unlikely etiology for her current presentation.   Past Medical History: Other PMH:  migraines, depression/anxiety, HTN  Medications:  No Anticoagulant use  No Antiplatelet use Reviewed EMR for current medications  Allergies:  Reviewed  Social History: Drug Use: No  Family History:  There is no family history of premature cerebrovascular disease pertinent to this consultation  ROS : 14 Points Review of Systems was performed and was negative except mentioned in HPI.  Past Surgical History: There Is No Surgical History Contributory To Today's Visit    Examination: BP(141/84), Pulse(83), Blood Glucose(122) 1A: Level of Consciousness - Alert; keenly responsive + 0 1B: Ask Month and Age - Both Questions Right + 0 1C: Blink Eyes & Squeeze Hands - Performs Both Tasks + 0 2: Test Horizontal Extraocular Movements - Normal + 0 3: Test Visual Fields - Partial Hemianopia + 1 4: Test Facial Palsy (Use Grimace if Obtunded) - Normal symmetry + 0 5A: Test Left Arm Motor Drift - No Drift for 10 Seconds + 0 5B: Test Right Arm Motor Drift - No Drift for 10 Seconds + 0 6A: Test Left Leg Motor Drift - No Drift for 5 Seconds + 0 6B: Test Right Leg Motor Drift - No Drift for 5 Seconds + 0 7: Test Limb Ataxia (FNF/Heel-Shin) - No Ataxia + 0 8: Test Sensation - Normal; No sensory loss + 0 9: Test Language/Aphasia - Normal; No aphasia + 0 10: Test Dysarthria - Normal + 0 11: Test Extinction/Inattention - No abnormality + 0  NIHSS Score: 1 Notes: Questionable partial hemianopsia versus simply blurring is not clear. Possible binasal, though exam limited.   Pre-Morbid Modified Rankin Scale: 0 Points = No symptoms at all  Spoke with : ed provider  This consult was conducted in real time using interactive audio and Immunologist. Patient  was informed of the technology being used for this visit and agreed to proceed. Patient located in hospital and provider located at home/office setting.   Patient is being evaluated for possible acute neurologic impairment and high probability of imminent or life-threatening deterioration. I spent total of 35 minutes providing care to this patient, including time for face to face visit via telemedicine, review of medical records, imaging studies and discussion of findings with providers, the patient and/or family.   Dr Elspeth Narrow   [facility] For Inpatient follow-up with [facility] physician please call [facility] at [phone]. As we are not an outpatient service for any post hospital discharge needs please contact the hospital for assistance. If you have any questions for the [facility] physicians or need to reconsult for clinical or diagnostic changes please contact us  via [facility] at [phone].

## 2023-12-18 NOTE — ED Triage Notes (Signed)
 Pt reports aprox 2 hours ago she had a near syncopal episode at work, pt became light headed and weak and then began vomiting. Pt reports since she's has had blurred vision on her right eye. Pts speech clear, states she switched BP medications 1 week ago

## 2023-12-18 NOTE — ED Provider Notes (Signed)
 [facility] Provider Note    Event Date/Time   First MD Initiated Contact with Patient 12/18/23 2304     (approximate)   History   Near Syncope   HPI  Ashley Collins is a 63 y.o. female with history of hypertension, hyperlipidemia, kidney stones who presents to the emergency department with a near syncopal event that occurred just prior to arrival.  Patient states she was at work when she suddenly felt lightheaded, nauseated and then had blurry vision, loss of vision in the right eye then also involve the left eye.  This occurred around 8:30 PM.  She states she has a headache currently and that she has a history of migraines but this feels different.  She has had nausea and vomiting.  She states she became diaphoretic.  No chest pain, shortness of breath.  No diarrhea.  She does report she was taken off amlodipine recently for leg swelling and started on lisinopril 5 mg.  She is worried that this may not be enough to keep her blood pressure controlled.  She did undergo cystoscopy, right ureteroscopy and laser lithotripsy of a right 3 mm lower pole renal stone on 6/20/202 5 with Dr. Francisca.  Husband reports that she has not been eating well recently.  She thinks that she has been drinking enough fluids but she admits to poor oral intake.  History provided by patient, husband.    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   Cardiac arrhythmia    Nonspecific ST T wave changes on EKG   Chronic venous insufficiency of lower extremity    Complication of anesthesia    nausea and vomiting   DDD (degenerative disc disease), lumbosacral    Essential hypertension    Headache    History of kidney stones    Hyperlipidemia    Kidney stones    Lymphedema    Migraines    Osteoporosis    PONV (postoperative nausea and vomiting)    Right ureteral stone    Vitamin B12 deficiency    Vitamin D  deficiency     Past Surgical History:  Procedure Laterality Date   AUGMENTATION MAMMAPLASTY     CESAREAN SECTION     x 4   COLONOSCOPY WITH PROPOFOL  N/A 03/31/2022   Procedure: COLONOSCOPY WITH PROPOFOL ;  Surgeon: Onita Elspeth Sharper, DO;  Location: [facility];  Service: Gastroenterology;  Laterality: N/A;   CYSTOSCOPY W/ RETROGRADES Bilateral 07/10/2020   Procedure: CYSTOSCOPY WITH RETROGRADE PYELOGRAM;  Surgeon: Francisca Redell BROCKS, MD;  Location: [facility] ORS;  Service: Urology;  Laterality: Bilateral;   CYSTOSCOPY/URETEROSCOPY/HOLMIUM LASER/STENT PLACEMENT     CYSTOSCOPY/URETEROSCOPY/HOLMIUM LASER/STENT PLACEMENT Bilateral 07/10/2020   Procedure: CYSTOSCOPY/URETEROSCOPY/HOLMIUM LASER/STENT PLACEMENT;  Surgeon: Francisca Redell BROCKS, MD;  Location: [facility] ORS;  Service: Urology;  Laterality: Bilateral;   CYSTOSCOPY/URETEROSCOPY/HOLMIUM LASER/STENT PLACEMENT Right 08/25/2023   Procedure: CYSTOSCOPY/URETEROSCOPY/HOLMIUM LASER;  Surgeon: Francisca Redell BROCKS, MD;  Location: [facility] OR[facility];  Service: Urology;  Laterality: Right;   CYSTOSCOPY/URETEROSCOPY/HOLMIUM LASER/STENT PLACEMENT Right 12/15/2023   Procedure: CYSTOSCOPY/URETEROSCOPY/HOLMIUM LASER;  Surgeon: Francisca Redell BROCKS, MD;  Location: [facility] OR[facility];  Service: Urology;  Laterality: Right;   EXTRACORPOREAL SHOCK WAVE LITHOTRIPSY     x 10 plus   Eye Lift     FACIAL COSMETIC SURGERY     GANGLION CYST EXCISION Right 12/21/2021   Procedure: REMOVAL GANGLION OF WRIST;  Surgeon: Kathlynn Sharper, MD;  Location: [facility] ORS;  Service: Orthopedics;  Laterality: Right;   LIPOSUCTION     TONSILLECTOMY     TRIGGER FINGER RELEASE Right 12/21/2021   Procedure: RELEASE TRIGGER FINGER/A-1 PULLEY;  Surgeon: Kathlynn Sharper, MD;  Location: [facility] ORS;  Service: Orthopedics;  Laterality: Right;   URETEROSCOPY WITH HOLMIUM LASER LITHOTRIPSY      MEDICATIONS:  Prior to Admission medications   Medication Sig Start Date End Date Taking? Authorizing Provider  AIMOVIG 140 MG/ML SOAJ  Inject 140 mg as directed every 28 (twenty-eight) days. 08/14/23   [provider]  amitriptyline  (ELAVIL ) 75 MG tablet Take 1 tablet (75 mg total) by mouth at bedtime. Got it from the primary care Patient not taking: Reported on 12/15/2023 04/03/23   Eappen, Saramma, MD  cyanocobalamin (VITAMIN B12) 1000 MCG/ML injection Inject 1,000 mcg into the muscle every 30 (thirty) days. 03/24/22   [provider]  estradiol (ESTRACE) 1 MG tablet Take 1 mg by mouth at bedtime. 08/04/23   [provider]  HYDROcodone -acetaminophen  (NORCO/VICODIN) 5-325 MG tablet Take 1 tablet by mouth every 6 (six) hours as needed for moderate pain (pain score 4-6). 11/29/23   Helon Kirsch A, PA-C  HYDROcodone -acetaminophen  (NORCO/VICODIN) 5-325 MG tablet Take 1 tablet by mouth every 6 (six) hours as needed for up to 3 days for severe pain (pain score 7-10). 12/15/23 12/18/23  Francisca Redell BROCKS, MD  hydrOXYzine  (VISTARIL ) 25 MG capsule Take 25 mg by mouth daily as needed for anxiety.    [provider]  lamoTRIgine  (LAMICTAL ) 25 MG tablet Take 1 tablet (25 mg total) by mouth daily. Patient not taking: Reported on 12/15/2023 11/14/23   Eappen, Saramma, MD  LORazepam  (ATIVAN ) 0.5 MG tablet Take 1 tablet (0.5 mg total) by mouth as directed. Take 1 tablet up to 3-4 times a week only for severe anxiety attacks,pls limit use, 26 tablets must last 30 days 11/14/23 02/12/24  Eappen, Saramma, MD  naratriptan (AMERGE) 2.5 MG tablet Take 2.5 mg by mouth as needed for migraine. 01/16/23 01/16/24  [provider]  OnabotulinumtoxinA (BOTOX IM) Inject 1 Dose into the muscle every 3 (three) months.    [provider]  progesterone (PROMETRIUM) 100 MG capsule Take 100 mg by mouth at bedtime. 08/04/23   [provider]  promethazine  (PHENERGAN ) 25 MG tablet Take 1 tablet (25 mg total) by mouth every 6 (six) hours as needed for nausea or vomiting. 11/28/23   Helon Kirsch A, PA-C  tiZANidine  (ZANAFLEX) 4 MG tablet Take 4 mg by mouth daily as needed for muscle spasms. 11/16/23   [provider]  Vitamin D, Ergocalciferol, (DRISDOL) 1.25 MG (50000 UNIT) CAPS capsule Take 50,000 Units by mouth every Monday. 08/07/23   [provider]  zolpidem  (AMBIEN ) 10 MG tablet Take 1 tablet (10 mg total) by mouth at bedtime. 11/14/23 03/13/24  Eappen, Saramma, MD    Physical Exam   Triage Vital Signs: ED Triage Vitals  Encounter Vitals Group     BP 12/18/23 2226 (!) 141/84     Girls Systolic BP Percentile --      Girls Diastolic BP Percentile --      Boys Systolic BP Percentile --      Boys Diastolic BP Percentile --      Pulse Rate 12/18/23 2226 83     Resp 12/18/23 2226 16     Temp 12/18/23 2226 97.9 F (36.6 C)     Temp src --      SpO2  12/18/23 2226 99 %     Weight 12/18/23 2225 114 lb 13.8 oz (52.1 kg)     Height 12/18/23 2225 5' 2 (1.575 m)     Head Circumference --      Peak Flow --      Pain Score 12/18/23 2225 6     Pain Loc --      Pain Education --      Exclude from Growth Chart --     Most recent vital signs: Vitals:   12/19/23 0224 12/19/23 0300  BP:  (!) 133/53  Pulse:  75  Resp:  12  Temp: 98.3 F (36.8 C)   SpO2:  98%    CONSTITUTIONAL: Alert, responds appropriately to questions. Well-appearing; well-nourished HEAD: Normocephalic, atraumatic EYES: Conjunctivae clear, pupils appear equal, sclera nonicteric ENT: normal nose; moist mucous membranes NECK: Supple, normal ROM CARD: RRR; S1 and S2 appreciated RESP: Normal chest excursion without splinting or tachypnea; breath sounds clear and equal bilaterally; no wheezes, no rhonchi, no rales, no hypoxia or respiratory distress, speaking full sentences ABD/GI: Non-distended; soft, non-tender, no rebound, no guarding, no peritoneal signs BACK: The back appears normal EXT: Normal ROM in all joints; no deformity noted, no edema SKIN: Normal color for age and race; warm; no rash on exposed  skin NEURO: Moves all extremities equally, normal speech, reports right-sided hemianopsia, cranial nerves II through XII intact, normal sensation PSYCH: The patient's mood and manner are appropriate.   ED Results / Procedures / Treatments   LABS: (all labs ordered are listed, but only abnormal results are displayed) Labs Reviewed  CBC WITH DIFFERENTIAL/PLATELET - Abnormal; Notable for the following components:      Result Value   WBC 13.1 (*)    Neutro Abs 9.1 (*)    Monocytes Absolute 1.2 (*)    All other components within normal limits  COMPREHENSIVE METABOLIC PANEL WITH GFR - Abnormal; Notable for the following components:   Potassium 3.3 (*)    Glucose, Bld 121 (*)    Creatinine, Ser 1.63 (*)    GFR, Estimated 35 (*)    All other components within normal limits  URINALYSIS, W/ REFLEX TO CULTURE (INFECTION SUSPECTED) - Abnormal; Notable for the following components:   Color, Urine STRAW (*)    APPearance CLEAR (*)    Specific Gravity, Urine 1.001 (*)    Hgb urine dipstick MODERATE (*)    Bacteria, UA MANY (*)    All other components within normal limits  HEMOGLOBIN A1C - Abnormal; Notable for the following components:   Hgb A1c MFr Bld 4.6 (*)    All other components within normal limits  LIPID PANEL - Abnormal; Notable for the following components:   Cholesterol 235 (*)    Triglycerides 248 (*)    VLDL 50 (*)    LDL Cholesterol 135 (*)    All other components within normal limits  CBG MONITORING, ED - Abnormal; Notable for the following components:   Glucose-Capillary 122 (*)    All other components within normal limits  ETHANOL  PROTIME-INR  APTT  TSH  HIV ANTIBODY (ROUTINE TESTING W REFLEX)  TROPONIN I (HIGH SENSITIVITY)  TROPONIN I (HIGH SENSITIVITY)     EKG:  EKG Interpretation Date/Time:  Monday December 18 2023 22:31:03 EDT Ventricular Rate:  74 PR Interval:  110 QRS Duration:  60 QT Interval:  376 QTC Calculation: 417 R Axis:   51  Text  Interpretation: Sinus rhythm with short PR Nonspecific ST abnormality Abnormal  ECG When compared with ECG of 18-Aug-2023 13:29, No significant change was found Confirmed by Neomi Neptune [phone]) on 12/18/2023 11:24:06 PM         RADIOLOGY: My personal review and interpretation of imaging: MRI shows left PCA infarct.  I have personally reviewed all radiology reports.   MR Brain W and Wo Contrast Result Date: 12/19/2023 CLINICAL DATA:  Initial evaluation for acute neuro deficit, stroke suspected. EXAM: MRI HEAD WITHOUT AND WITH CONTRAST TECHNIQUE: Multiplanar, multiecho pulse sequences of the brain and surrounding structures were obtained without and with intravenous contrast. CONTRAST:  5mL GADAVIST GADOBUTROL 1 MMOL/ML IV SOLN COMPARISON:  CT from 12/18/2023. FINDINGS: Brain: Cerebral volume within normal limits. No significant cerebral white matter disease for age. Patchy small volume restricted diffusion seen involving the left occipital cortex, consistent with a small acute left PCA distribution infarct (series 5, images 20-15). No associated hemorrhage or mass effect. No other evidence for acute or subacute ischemia. Gray-white matter differentiation otherwise maintained. No areas of chronic cortical infarction. No acute or chronic intracranial blood products. No mass lesion, midline shift or mass effect. No hydrocephalus or extra-axial fluid collection. Pituitary gland and suprasellar region within normal limits. No abnormal enhancement. Vascular: Major intracranial vascular flow voids are maintained. Skull and upper cervical spine: Craniocervical junction within normal limits. Bone marrow signal intensity normal. No scalp soft tissue abnormality. Sinuses/Orbits: Prior bilateral ocular lens replacement. Paranasal sinuses are largely clear. No mastoid effusion. Other: None. IMPRESSION: 1. 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 No associated hemorrhage or mass  effect. 2. Otherwise normal brain MRI for age. Electronically Signed   By: Morene Hoard M.D.   On: 12/19/2023 03:08   CT HEAD CODE STROKE WO CONTRAST Result Date: 12/18/2023 CLINICAL DATA:  Code stroke. Initial evaluation for acute neuro deficit, stroke suspected. EXAM: CT HEAD WITHOUT CONTRAST TECHNIQUE: Contiguous axial images were obtained from the base of the skull through the vertex without intravenous contrast. RADIATION DOSE REDUCTION: This exam was performed according to the departmental dose-optimization program which includes automated exposure control, adjustment of the mA and/or kV according to patient size and/or use of iterative reconstruction technique. COMPARISON:  Prior MRI from 03/08/2022 FINDINGS: Brain: Cerebral volume within normal limits for patient age. No acute intracranial hemorrhage. No acute large vessel territory infarct. No mass lesion, midline shift, or mass effect. Ventricles are normal in size without hydrocephalus. No extra-axial fluid collection. Vascular: No abnormal hyperdense vessel. Skull: Scalp soft tissues demonstrate no acute abnormality. Calvarium intact. Sinuses/Orbits: Globes and orbital soft tissues within normal limits. Visualized paranasal sinuses are largely clear. No significant mastoid effusion. ASPECTS [facility] Stroke Program Early CT Score) - Ganglionic level infarction (caudate, lentiform nuclei, internal capsule, insula, M1-M3 cortex): 7 - Supraganglionic infarction (M4-M6 cortex): 3 Total score (0-10 with 10 being normal): 10 IMPRESSION: 1. Normal head CT.  No acute intracranial abnormality. 2. ASPECTS is 10. Results were called by telephone at the time of interpretation on 12/18/2023 at 10:56 pm to provider Dr. Viviann, who verbally acknowledged these results. Electronically Signed   By: Morene Hoard M.D.   On: 12/18/2023 22:58     PROCEDURES:  Critical Care performed: Yes, see critical care procedure note(s)   CRITICAL  CARE Performed by: Neptune Geralynn Capri   Total critical care time: 30 minutes  Critical care time was exclusive of separately billable procedures and treating other patients.  Critical care was necessary to treat or prevent imminent or life-threatening deterioration.  Critical  care was time spent personally by me on the following activities: development of treatment plan with patient and/or surrogate as well as nursing, discussions with consultants, evaluation of patient's response to treatment, examination of patient, obtaining history from patient or surrogate, ordering and performing treatments and interventions, ordering and review of laboratory studies, ordering and review of radiographic studies, pulse oximetry and re-evaluation of patient's condition.   SABRA1-3 Lead EKG Interpretation  Performed by: Jarika Robben, Josette SAILOR, DO Authorized by: Kyler Germer, Josette SAILOR, DO     Interpretation: normal     ECG rate:  77   ECG rate assessment: normal     Rhythm: sinus rhythm     Ectopy: none     Conduction: normal       IMPRESSION / MDM / ASSESSMENT AND PLAN / ED COURSE  I reviewed the triage vital signs and the nursing notes.    Patient here with right sided hemianopsia, headache, vomiting, near syncope, diaphoresis.  The patient is on the cardiac monitor to evaluate for evidence of arrhythmia and/or significant heart rate changes.   DIFFERENTIAL DIAGNOSIS (includes but not limited to):   CVA, intracranial hemorrhage, TIA, complex migraine, CVT, intracranial mass, anemia, electrolyte derangement, dehydration, ACS, arrhythmia   Patient's presentation is most consistent with acute presentation with potential threat to life or bodily function.   PLAN: Code stroke was called from triage.  Labs show leukocytosis of 13,000.  Normal hemoglobin.  Potassium of 3.3.  Mildly elevated creatinine of 1.63 which is elevated from her baseline.  Will give IV fluids.  Will add on troponin.  EKG shows no ischemia,  arrhythmia.  CT head reviewed and interpreted by myself and the radiologist and is unremarkable.  Patient has been seen by neurology.  They recommend giving migraine cocktail, full dose aspirin, obtaining MRI of the brain with and without contrast, obtaining TSH, hemoglobin A1c and lipid panel.  They feel that patient has had an inconsistent exam, history and would not be a TNK candidate at this time given NIH stroke scale of 0.  On my exam patient does appear to have a right-sided hemianopsia however she is very apprehensive about TNK and would not agree to it currently.   MEDICATIONS GIVEN IN ED: Medications  acetaminophen  (OFIRMEV ) IV 1,000 mg (1,000 mg Intravenous Not Given 12/19/23 0037)  atorvastatin (LIPITOR) tablet 40 mg (40 mg Oral Given 12/19/23 0355)  HYDROcodone -acetaminophen  (NORCO/VICODIN) 5-325 MG per tablet 1 tablet (has no administration in time range)  SUMAtriptan  (IMITREX ) tablet 50 mg (has no administration in time range)  LORazepam  (ATIVAN ) tablet 0.5 mg (has no administration in time range)  zolpidem  (AMBIEN ) tablet 5 mg (has no administration in time range)  tiZANidine (ZANAFLEX) tablet 4 mg (has no administration in time range)   stroke: early stages of recovery book (has no administration in time range)  acetaminophen  (TYLENOL ) tablet 650 mg (has no administration in time range)    Or  acetaminophen  (TYLENOL ) 160 MG/5ML solution 650 mg (has no administration in time range)    Or  acetaminophen  (TYLENOL ) suppository 650 mg (has no administration in time range)  enoxaparin (LOVENOX) injection 30 mg (has no administration in time range)  aspirin EC tablet 81 mg (has no administration in time range)  clopidogrel (PLAVIX) tablet 75 mg (has no administration in time range)  cephALEXin (KEFLEX) capsule 500 mg (has no administration in time range)  potassium chloride  SA (KLOR-CON  M) CR tablet 40 mEq (has no administration in time range)  sodium chloride   flush (NS) 0.9 %  injection 3 mL (3 mLs Intravenous Given 12/18/23 2310)  sodium chloride  0.9 % bolus 1,000 mL (0 mLs Intravenous Stopped 12/19/23 0040)  ondansetron  (ZOFRAN ) injection 4 mg (4 mg Intravenous Given 12/18/23 2336)  aspirin EC tablet 325 mg (325 mg Oral Given 12/19/23 0029)  diphenhydrAMINE  (BENADRYL ) injection 25 mg (25 mg Intravenous Given 12/19/23 0023)  prochlorperazine (COMPAZINE) injection 10 mg (10 mg Intravenous Given 12/19/23 0025)  ketorolac  (TORADOL ) 30 MG/ML injection 30 mg (30 mg Intravenous Given 12/19/23 0028)  gadobutrol (GADAVIST) 1 MMOL/ML injection 5 mL (5 mLs Intravenous Contrast Given 12/19/23 0154)  clopidogrel (PLAVIX) tablet 300 mg (300 mg Oral Given 12/19/23 0355)     ED COURSE: Labs show leukocytosis of 13,000 which appears chronic.  Normal electrolytes, TSH, INR.  Negative troponin.  MRI of the brain reviewed and interpreted by myself and the radiologist and shows small acute left PCA infarct consistent with patient's symptoms today.  Discussed with neurologist who recommends starting patient on atorvastatin 40 mg daily, loading with Plavix 300 mg once now and starting Plavix 75 mg daily.  Will discuss with the hospitalist for admission.  Patient updated.   CONSULTS:  Consulted and discussed patient's case with hospitalist, Dr. Cleatus.  I have recommended admission and consulting physician agrees and will place admission orders.  Patient (and family if present) agree with this plan.   I reviewed all nursing notes, vitals, pertinent previous records.  All labs, EKGs, imaging ordered have been independently reviewed and interpreted by myself.    OUTSIDE RECORDS REVIEWED: Reviewed recent urology notes.       FINAL CLINICAL IMPRESSION(S) / ED DIAGNOSES   Final diagnoses:  Vision changes  Near syncope  Acute CVA (cerebrovascular accident) (HCC)     Rx / DC Orders   ED Discharge Orders     None        Note:  This document was prepared using Dragon voice  recognition software and may include unintentional dictation errors.   Naama Sappington, Josette SAILOR, DO 12/19/23 [phone]

## 2023-12-18 NOTE — Progress Notes (Signed)
 2242 - stroke alert activated for this patient with a LKWT of 2030. C/o near syncopal episode, vomiting, right eye is blurry, can't see if she looks to the left. In CT on activation.   7752 - [facility] page sent.   2249 - Patient back from CT.   2253 - [facility] Elspeth Narrow on cart. Report given.   2255 - NIHSS assessment began by TSM[facility].   2300 - Switched to Felicity cart 1.   2306 - NCCT head read negative, AS 10 given to [facility] face to face.   2307 - [facility] discussing TNK with the patient.   2309 - Patient declined TNK at this time.   2313 - [facility] to make recommendations to EDP. TSRN and TSM[facility] off cart.

## 2023-12-19 ENCOUNTER — Telehealth (INDEPENDENT_AMBULATORY_CARE_PROVIDER_SITE_OTHER): Admitting: Psychiatry

## 2023-12-19 ENCOUNTER — Observation Stay

## 2023-12-19 ENCOUNTER — Emergency Department

## 2023-12-19 DIAGNOSIS — I639 Cerebral infarction, unspecified: Secondary | ICD-10-CM | POA: Diagnosis present

## 2023-12-19 DIAGNOSIS — I10 Essential (primary) hypertension: Secondary | ICD-10-CM | POA: Insufficient documentation

## 2023-12-19 DIAGNOSIS — M316 Other giant cell arteritis: Secondary | ICD-10-CM

## 2023-12-19 DIAGNOSIS — H539 Unspecified visual disturbance: Principal | ICD-10-CM

## 2023-12-19 DIAGNOSIS — N179 Acute kidney failure, unspecified: Secondary | ICD-10-CM

## 2023-12-19 DIAGNOSIS — I480 Paroxysmal atrial fibrillation: Secondary | ICD-10-CM | POA: Diagnosis not present

## 2023-12-19 DIAGNOSIS — I779 Disorder of arteries and arterioles, unspecified: Secondary | ICD-10-CM

## 2023-12-19 DIAGNOSIS — I6389 Other cerebral infarction: Secondary | ICD-10-CM | POA: Diagnosis not present

## 2023-12-19 DIAGNOSIS — E876 Hypokalemia: Secondary | ICD-10-CM

## 2023-12-19 DIAGNOSIS — G43109 Migraine with aura, not intractable, without status migrainosus: Secondary | ICD-10-CM

## 2023-12-19 DIAGNOSIS — F411 Generalized anxiety disorder: Secondary | ICD-10-CM

## 2023-12-19 DIAGNOSIS — E785 Hyperlipidemia, unspecified: Secondary | ICD-10-CM | POA: Insufficient documentation

## 2023-12-19 DIAGNOSIS — D72829 Elevated white blood cell count, unspecified: Secondary | ICD-10-CM

## 2023-12-19 LAB — LIPID PANEL
Cholesterol: 235 mg/dL — ABNORMAL HIGH (ref 0–200)
HDL: 50 mg/dL (ref >40)
LDL Cholesterol: 135 mg/dL — ABNORMAL HIGH (ref 0–99)
Total CHOL/HDL Ratio: 4.7 ratio
Triglycerides: 248 mg/dL — ABNORMAL HIGH (ref <150)
VLDL: 50 mg/dL — ABNORMAL HIGH (ref 0–40)

## 2023-12-19 LAB — BASIC METABOLIC PANEL WITH GFR
Anion gap: 7 (ref 5–15)
BUN: 17 mg/dL (ref 8–23)
CO2: 24 mmol/L (ref 22–32)
Calcium: 8.4 mg/dL — ABNORMAL LOW (ref 8.9–10.3)
Chloride: 110 mmol/L (ref 98–111)
Creatinine, Ser: 1.02 mg/dL — ABNORMAL HIGH (ref 0.44–1.00)
GFR, Estimated: >60 mL/min (ref >60)
Glucose, Bld: 97 mg/dL (ref 70–99)
Potassium: 3.3 mmol/L — ABNORMAL LOW (ref 3.5–5.1)
Sodium: 141 mmol/L (ref 135–145)

## 2023-12-19 LAB — TROPONIN I (HIGH SENSITIVITY): Troponin I (High Sensitivity): 5 ng/L (ref <18)

## 2023-12-19 LAB — URINALYSIS, W/ REFLEX TO CULTURE (INFECTION SUSPECTED)
Bilirubin Urine: NEGATIVE
Glucose, UA: NEGATIVE mg/dL
Ketones, ur: NEGATIVE mg/dL
Leukocytes,Ua: NEGATIVE
Nitrite: NEGATIVE
Protein, ur: NEGATIVE mg/dL
Specific Gravity, Urine: 1.001 — ABNORMAL LOW (ref 1.005–1.030)
pH: 7 (ref 5.0–8.0)

## 2023-12-19 LAB — TSH: TSH: 1.455 u[IU]/mL (ref 0.350–4.500)

## 2023-12-19 LAB — HEMOGLOBIN A1C
Hgb A1c MFr Bld: 4.6 % — ABNORMAL LOW (ref 4.8–5.6)
Mean Plasma Glucose: 85.32 mg/dL

## 2023-12-19 LAB — HIV ANTIBODY (ROUTINE TESTING W REFLEX): HIV Screen 4th Generation wRfx: NONREACTIVE

## 2023-12-19 LAB — ETHANOL: Alcohol, Ethyl (B): <15 mg/dL (ref <15)

## 2023-12-19 MED ORDER — ONDANSETRON HCL 4 MG/2ML IJ SOLN: 4.0000 mg | Freq: Four times a day (QID) | INTRAMUSCULAR | Status: DC | PRN

## 2023-12-19 MED ORDER — CLOPIDOGREL BISULFATE 75 MG PO TABS
300.0000 mg | ORAL_TABLET | Freq: Once | ORAL | Status: AC
  Administered 2023-12-19: 300 mg via ORAL
  Filled 2023-12-19: qty 4

## 2023-12-19 MED ORDER — ATORVASTATIN CALCIUM 20 MG PO TABS
40.0000 mg | ORAL_TABLET | Freq: Every day | ORAL | Status: DC
  Administered 2023-12-19 – 2023-12-20 (×2): 40 mg via ORAL
  Filled 2023-12-19 (×3): qty 2

## 2023-12-19 MED ORDER — HYDROCODONE-ACETAMINOPHEN 5-325 MG PO TABS
1.0000 | ORAL_TABLET | Freq: Four times a day (QID) | ORAL | Status: DC | PRN
  Administered 2023-12-19 – 2023-12-20 (×3): 1 via ORAL
  Filled 2023-12-19 (×3): qty 1

## 2023-12-19 MED ORDER — STROKE: EARLY STAGES OF RECOVERY BOOK: Freq: Once | Status: AC

## 2023-12-19 MED ORDER — ZOLPIDEM TARTRATE 5 MG PO TABS
5.0000 mg | ORAL_TABLET | Freq: Every day | ORAL | Status: DC
  Administered 2023-12-19: 5 mg via ORAL
  Filled 2023-12-19: qty 1

## 2023-12-19 MED ORDER — ASPIRIN 81 MG PO TBEC
81.0000 mg | DELAYED_RELEASE_TABLET | Freq: Every day | ORAL | Status: DC
  Administered 2023-12-20: 81 mg via ORAL
  Filled 2023-12-19 (×2): qty 1

## 2023-12-19 MED ORDER — CLOPIDOGREL BISULFATE 75 MG PO TABS
75.0000 mg | ORAL_TABLET | Freq: Every day | ORAL | Status: DC
  Administered 2023-12-20: 75 mg via ORAL
  Filled 2023-12-19 (×2): qty 1

## 2023-12-19 MED ORDER — METOCLOPRAMIDE HCL 5 MG/ML IJ SOLN
10.0000 mg | Freq: Once | INTRAMUSCULAR | Status: AC
  Administered 2023-12-19: 10 mg via INTRAVENOUS
  Filled 2023-12-19: qty 2

## 2023-12-19 MED ORDER — TIZANIDINE HCL 4 MG PO TABS: 4.0000 mg | ORAL_TABLET | Freq: Every day | ORAL | Status: DC | PRN

## 2023-12-19 MED ORDER — ENOXAPARIN SODIUM 30 MG/0.3ML IJ SOSY
30.0000 mg | PREFILLED_SYRINGE | INTRAMUSCULAR | Status: DC
  Filled 2023-12-19: qty 0.3

## 2023-12-19 MED ORDER — ACETAMINOPHEN 650 MG RE SUPP: 650.0000 mg | RECTAL | Status: DC | PRN

## 2023-12-19 MED ORDER — POTASSIUM CHLORIDE CRYS ER 20 MEQ PO TBCR
40.0000 meq | EXTENDED_RELEASE_TABLET | Freq: Once | ORAL | Status: AC
  Administered 2023-12-19: 40 meq via ORAL
  Filled 2023-12-19: qty 2

## 2023-12-19 MED ORDER — IOHEXOL 350 MG/ML SOLN
75.0000 mL | Freq: Once | INTRAVENOUS | Status: AC | PRN
  Administered 2023-12-19: 75 mL via INTRAVENOUS

## 2023-12-19 MED ORDER — GADOBUTROL 1 MMOL/ML IV SOLN
5.0000 mL | Freq: Once | INTRAVENOUS | Status: AC | PRN
  Administered 2023-12-19: 5 mL via INTRAVENOUS

## 2023-12-19 MED ORDER — SUMATRIPTAN SUCCINATE 50 MG PO TABS: 50.0000 mg | ORAL_TABLET | ORAL | Status: DC | PRN

## 2023-12-19 MED ORDER — CEPHALEXIN 500 MG PO CAPS
500.0000 mg | ORAL_CAPSULE | Freq: Two times a day (BID) | ORAL | Status: DC
  Administered 2023-12-19 – 2023-12-20 (×3): 500 mg via ORAL
  Filled 2023-12-19 (×3): qty 1

## 2023-12-19 MED ORDER — ENOXAPARIN SODIUM 40 MG/0.4ML IJ SOSY
40.0000 mg | PREFILLED_SYRINGE | INTRAMUSCULAR | Status: DC
  Administered 2023-12-19 – 2023-12-20 (×2): 40 mg via SUBCUTANEOUS
  Filled 2023-12-19 (×3): qty 0.4

## 2023-12-19 MED ORDER — ACETAMINOPHEN 160 MG/5ML PO SOLN: 650.0000 mg | ORAL | Status: DC | PRN

## 2023-12-19 MED ORDER — POTASSIUM CHLORIDE CRYS ER 20 MEQ PO TBCR: 40.0000 meq | EXTENDED_RELEASE_TABLET | Freq: Once | ORAL | Status: DC

## 2023-12-19 MED ORDER — ACETAMINOPHEN 325 MG PO TABS: 650.0000 mg | ORAL_TABLET | ORAL | Status: DC | PRN

## 2023-12-19 MED ORDER — LORAZEPAM 0.5 MG PO TABS
0.5000 mg | ORAL_TABLET | ORAL | Status: DC | PRN
  Administered 2023-12-19: 0.5 mg via ORAL
  Filled 2023-12-19: qty 1

## 2023-12-19 NOTE — Plan of Care (Signed)
  Problem: Education: Goal: Knowledge of secondary prevention will improve (MUST DOCUMENT ALL) Outcome: Progressing   Problem: Coping: Goal: Will identify appropriate support needs Outcome: Progressing   Problem: Self-Care: Goal: Ability to communicate needs accurately will improve Outcome: Progressing   Problem: Nutrition: Goal: Risk of aspiration will decrease Outcome: Progressing   Problem: Health Behavior/Discharge Planning: Goal: Ability to manage health-related needs will improve Outcome: Progressing   Problem: Clinical Measurements: Goal: Ability to maintain clinical measurements within normal limits will improve Outcome: Progressing Goal: Will remain free from infection Outcome: Progressing Goal: Diagnostic test results will improve Outcome: Progressing Goal: Respiratory complications will improve Outcome: Progressing   Problem: Coping: Goal: Level of anxiety will decrease Outcome: Progressing   Problem: Safety: Goal: Ability to remain free from injury will improve Outcome: Progressing   Problem: Skin Integrity: Goal: Risk for impaired skin integrity will decrease Outcome: Progressing

## 2023-12-19 NOTE — Assessment & Plan Note (Addendum)
 With right eye visual changes.  MRI showing small acute left PCA distribution infarct involving left occipital cortex.  Permissive hypertension for first 24-48 hrs post stroke onset: Prn Labetalol  IV or Vasotec IV If BP greater than 220/120  Neurology consultation, aspirin, Plavix and Lipitor.  LDL 135.

## 2023-12-19 NOTE — TOC Transition Note (Signed)
 Transition of Care [facility]) - Discharge Note   Patient Details  Name: Ashley Collins MRN: [id number] Date of Birth: [birth year]  [facility] CM/SW Contact:  Dalia GORMAN Fuse, RN Phone Number: 12/19/2023, 3:03 PM   Clinical Narrative:     Patient is from home with her partner. She drives some, but her partner does most of the driving. Her PCP is Gaetana Haddock and she uses [facility]. She has DME: walker, grab bars, and a raised commode seat. No TOC needs identified.         Patient Goals and CMS Choice            Discharge Placement                       Discharge Plan and Services Additional resources added to the After Visit Summary for                                       Social Drivers of Health (SDOH) Interventions SDOH Screenings   Food Insecurity: No Food Insecurity (12/19/2023)  Housing: Low Risk  (12/19/2023)  Transportation Needs: No Transportation Needs (12/19/2023)  Utilities: Not At Risk (12/19/2023)  Depression (PHQ2-9): Low Risk  (04/04/2023)  Financial Resource Strain: Patient Declined (11/10/2023)   Received from [facility]  Social Connections: Moderately Integrated (12/19/2023)  Tobacco Use: Low Risk  (12/18/2023)  Recent Concern: Tobacco Use - Medium Risk (11/17/2023)   Received from [facility]     Readmission Risk Interventions     No data to display

## 2023-12-19 NOTE — Progress Notes (Signed)
 Patient connected for the appointment however at this time is admitted to the hospital for medical reasons.  Unable to complete this appointment.  Patient agrees to call back once discharged to schedule an appointment.

## 2023-12-19 NOTE — Assessment & Plan Note (Addendum)
 Likely secondary to stroke.  Triptans are contraindicated with stroke.

## 2023-12-19 NOTE — Progress Notes (Signed)
 Progress Note   Patient: Ashley Collins FMW:[id number] DOB: 10-15-1960 DOA: 12/18/2023     0 DOS: the patient was seen and examined on 12/19/2023   Brief hospital course: 63 y.o. female with medical history significant for Migraines with aura, being admitted with acute PCA infarct after presenting with lightheadedness weakness and vomiting and right visual field deficits.  Symptoms started at 8 PM on 12/18/2023.  She came in as a code stroke.  Initial head CT negative and she was seen in consultation by teleneurology who initially considered atypical migraine however MRI returned with PCA infarct.  NIH was 1.  tPA was discussed but patient was apprehensive.  She was loaded with aspirin and Plavix and given atorvastatin and admission requested. She also received a migraine cocktail in the ED Other workup in the ED notable for potassium of 3.3, creatinine of 1.63 up from baseline of 0.78, WBC 13.1 and many bacteria in her urine.  Troponin 5, EtOH less than 15.  EKG nonacute.   6/24.  Patient has right eye visual disturbance secondary to stroke.  CT angiogram does not show any large vessel occlusion.  Echocardiogram ordered.  Patient on aspirin, Lipitor and Plavix.   Assessment and Plan: * Acute CVA (cerebrovascular accident) (HCC) With right eye visual changes.  MRI showing small acute left PCA distribution infarct involving left occipital cortex.  Permissive hypertension for first 24-48 hrs post stroke onset: Prn Labetalol  IV or Vasotec IV If BP greater than 220/120  Neurology consultation, aspirin, Plavix and Lipitor.  LDL 135.  AKI (acute kidney injury) (HCC) Creatinine improved from 1.63 down to 1.02  Migraine with aura and without status migrainosus, not intractable Likely secondary to stroke.  Triptans are contraindicated with stroke.  Hypokalemia Replace potassium again  Leukocytosis Likely reactive from stroke  Essential hypertension Holding lisinopril but may need to restart  tomorrow.  Hyperlipidemia, unspecified Lipitor started        Subjective: Patient strength is 5 bilaterally.  Patient's only symptom is right eye vision issues.  Yesterday had a cold sweat while at work then got lightheaded and vomited and then had visual disturbance and came in for further evaluation.  Patient was found to have a stroke.  Physical Exam: Vitals:   12/19/23 0508 12/19/23 0823 12/19/23 1137 12/19/23 1608  BP: (!) 207/73 (!) 148/71 (!) 159/83 (!) 168/87  Pulse: 68 63 62 65  Resp: 18 16 16 18   Temp: 97.7 F (36.5 C) 98 F (36.7 C) 98.1 F (36.7 C) 98.3 F (36.8 C)  TempSrc: Oral   Oral  SpO2: 100% 100% 100% 100%  Weight: 59.8 kg     Height: 5' 2 (1.575 m)      Physical Exam HENT:     Head: Normocephalic.   Eyes:     General: Lids are normal.     Conjunctiva/sclera: Conjunctivae normal.     Pupils: Pupils are equal, round, and reactive to light.     Comments: Right eye blurred vision.   Cardiovascular:     Rate and Rhythm: Normal rate and regular rhythm.     Heart sounds: Normal heart sounds, S1 normal and S2 normal.  Pulmonary:     Breath sounds: No decreased breath sounds, wheezing, rhonchi or rales.  Abdominal:     Palpations: Abdomen is soft.     Tenderness: There is no abdominal tenderness.   Musculoskeletal:     Right lower leg: No swelling.     Left lower leg: No swelling.  Skin:    General: Skin is warm.     Findings: No rash.   Neurological:     Mental Status: She is alert and oriented to person, place, and time.     Data Reviewed: Creatinine 1.02, potassium 3.3 HIV nonreactive CT angio head and neck negative for large vessel occlusion mild irregularity at the left PCA origin up to 50% stenosis of the right ICA siphon but no hemodynamically significant stenosis. MRI of the brain shows small acute left PCA distribution infarct involving the left occipital cortex.  Family Communication: Husband at bedside  Disposition: Status is:  Observation Awaiting echocardiogram  Planned Discharge Destination: Home    Time spent: 28 minutes  Author: Charlie Patterson, MD 12/19/2023 4:19 PM  For on call review [email or web address].

## 2023-12-19 NOTE — Assessment & Plan Note (Addendum)
 Likely reactive from stroke

## 2023-12-19 NOTE — Progress Notes (Signed)
 SLP Cancellation Note  Patient Details Name: Ashley Collins MRN: [id number] DOB: 04-13-1961   Cancelled treatment:       Reason Eval/Treat Not Completed: SLP screened, no needs identified, will sign off (chart reviewed; consulted NSG and met w/ pt in room this AM.)  Pt is a 63 y.o. female with medical history significant for Migraines with aura, anxiety, recent Urology procedure for kidney stones 6/20, being admitted with acute PCA infarct per MRI, after presenting with lightheadedness weakness and vomiting and right visual field deficits. NIH 1 per Neurology note. 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   Pt denied any difficulty swallowing and is currently on a regular diet; tolerates swallowing pills w/ water  per NSG.  Pt conversed in Full conversation w/out any expressive/receptive deficits noted; pt denied any speech-language deficits. Speech clear, intelligible.  No further skilled ST services indicated as pt appears at her communication/swallowing baseline. Pt agreed. NSG to reconsult if any change in status while admitted.       Comer Portugal, MS, CCC-SLP Speech Language Pathologist Rehab Services; [facility] [phone] (ascom) Ashley Collins 12/19/2023, 11:14 AM

## 2023-12-19 NOTE — Progress Notes (Signed)
 Occupational Therapy Evaluation Patient Details Name: Ashley Collins MRN: [id number] DOB: Feb 04, 1961 Today's Date: 12/19/2023   History of Present Illness   Pt is a 63 y.o. female who presents to the emergency department with a near syncopal event. Patient states she was at work when she suddenly felt lightheaded, nauseated and then had blurry vision, loss of vision in the right eye then also involve the left eye. MRI: Small acute left PCA distribution infarct involving the left  occipital cortex. No associated hemorrhage or mass effect. PMH: hypertension, hyperlipidemia, kidney stones     Clinical Impressions Ms Scriven was seen for OT evaluation this date. Prior to hospital admission, pt was IND in ADLs. Pt lives w/ spouse and daughter. Pt currently requires SUPERVISION for standing ADL tasks, toileting, and donning socks. Pt ambulated w/ SUPERVISION ~ 300 ft with no AD use; hesitancy and decreased speed noted. No functional visual deficits noted except decreased smooth vertical tracking and slowed walking speed w/ L head turn and looking up. Albert's test completed with no neglect noted; further vision testing interrupted by transport. Pt educated on visual scanning strategies and d/c recs, OT role, and falls prevention.   Pt would benefit from skilled OT services to address noted impairments and functional limitations (see below for any additional details) in order to maximize safety and independence while minimizing falls risk and caregiver burden. Anticipate the need for follow up OT services upon acute hospital DC.      If plan is discharge home, recommend the following:   Assist for transportation     Functional Status Assessment   Patient has had a recent decline in their functional status and demonstrates the ability to make significant improvements in function in a reasonable and predictable amount of time.     Equipment Recommendations   None recommended by OT      Recommendations for Other Services         Precautions/Restrictions   Precautions Precautions: None Restrictions Weight Bearing Restrictions Per Provider Order: No     Mobility Bed Mobility Overal bed mobility: Independent                  Transfers Overall transfer level: Needs assistance Equipment used: None Transfers: Sit to/from Stand Sit to Stand: Supervision                  Balance Overall balance assessment: Modified Independent                                         ADL either performed or assessed with clinical judgement   ADL Overall ADL's : Needs assistance/impaired                                       General ADL Comments: SUPERVISION for standing grooming at sink, toileting, and donning socks     Vision   Vision Assessment?: Yes Tracking/Visual Pursuits: Decreased smoothness of vertical tracking Visual Fields: Right visual field deficit (R peripheral deficit)     Perception         Praxis         Pertinent Vitals/Pain Pain Assessment Pain Assessment: No/denies pain     Extremity/Trunk Assessment Upper Extremity Assessment Upper Extremity Assessment: Overall WFL for tasks assessed   Lower Extremity Assessment Lower  Extremity Assessment: Overall WFL for tasks assessed       Communication Communication Communication: No apparent difficulties   Cognition Arousal: Alert Behavior During Therapy: WFL for tasks assessed/performed Cognition: No apparent impairments                               Following commands: Intact       Cueing  General Comments   Cueing Techniques: Verbal cues;Gestural cues      Exercises     Shoulder Instructions      Home Living Family/patient expects to be discharged to:: Private residence Living Arrangements: Spouse/significant other;Children Available Help at Discharge: Family;Available PRN/intermittently Type of Home:  House Home Access: Level entry     Home Layout: Two level;Able to live on main level with bedroom/bathroom     Bathroom Shower/Tub: Producer, television/film/video: Standard     Home Equipment: Shower seat - built in          Prior Functioning/Environment Prior Level of Function : Independent/Modified Independent               ADLs Comments: works at [employer]    OT Problem List: Impaired vision/perception   OT Treatment/Interventions: Barrister's clerk education;Energy conservation;Visual/perceptual remediation/compensation      OT Goals(Current goals can be found in the care plan section)   Acute Rehab OT Goals Patient Stated Goal: to improve vision OT Goal Formulation: With patient Time For Goal Achievement: 12/19/23 Potential to Achieve Goals: Good ADL Goals Pt Will Perform Tub/Shower Transfer: Shower transfer;Independently;ambulating Additional ADL Goal #1: Pt will verbalize visual compensatory strategies in 3/3 trials. Additional ADL Goal #2: Pt will verbalize x3 strategies for safe navigation at home.   OT Frequency:  Min 2X/week    Co-evaluation              AM-PAC OT 6 Clicks Daily Activity     Outcome Measure Help from another person eating meals?: None Help from another person taking care of personal grooming?: None Help from another person toileting, which includes using toliet, bedpan, or urinal?: None Help from another person bathing (including washing, rinsing, drying)?: None Help from another person to put on and taking off regular upper body clothing?: None Help from another person to put on and taking off regular lower body clothing?: None 6 Click Score: 24   End of Session Equipment Utilized During Treatment: Gait belt  Activity Tolerance: Patient tolerated treatment well Patient left: in chair;Other (comment) (w/ transport present)  OT Visit Diagnosis: Other symptoms and signs involving the nervous system  (M70.101)                Time: 9080-9055 OT Time Calculation (min): 25 min Charges:  OT General Charges $OT Visit: 1 Visit OT Evaluation $OT Eval Low Complexity: 1 Low OT Treatments $Self Care/Home Management : 8-22 mins  [place], Student OT   [employer] 12/19/2023, 10:52 AM

## 2023-12-19 NOTE — Assessment & Plan Note (Addendum)
 Creatinine improved from 1.63 down to 1.02

## 2023-12-19 NOTE — Progress Notes (Signed)
 PHARMACIST - PHYSICIAN COMMUNICATION  CONCERNING:  Enoxaparin (Lovenox) for DVT Prophylaxis    RECOMMENDATION: Patient was prescribed enoxaprin 30mg  q24 hours for VTE prophylaxis.   Filed Weights   12/18/23 2225 12/19/23 0508  Weight: 52.1 kg (114 lb 13.8 oz) 59.8 kg (131 lb 13.4 oz)    Body mass index is 24.11 kg/m.  Estimated Creatinine Clearance: 44.1 mL/min (A) (by C-G formula based on SCr of 1.02 mg/dL (H)).  Patient is candidate for enoxaparin 40mg  every 24 hours based on CrCl now >59ml/min or Weight >45kg  DESCRIPTION: Pharmacy has adjusted enoxaparin dose per [facility] policy.  Patient is now receiving enoxaparin 40 mg every 24 hours   Allean Haas PharmD Clinical Pharmacist 12/19/2023

## 2023-12-19 NOTE — Hospital Course (Signed)
 63 y.o. female with medical history significant for Migraines with aura, being admitted with acute PCA infarct after presenting with lightheadedness weakness and vomiting and right visual field deficits.  Symptoms started at 8 PM on 12/18/2023.  She came in as a code stroke.  Initial head CT negative and she was seen in consultation by teleneurology who initially considered atypical migraine however MRI returned with PCA infarct.  NIH was 1.  tPA was discussed but patient was apprehensive.  She was loaded with aspirin and Plavix and given atorvastatin and admission requested. She also received a migraine cocktail in the ED Other workup in the ED notable for potassium of 3.3, creatinine of 1.63 up from baseline of 0.78, WBC 13.1 and many bacteria in her urine.  Troponin 5, EtOH less than 15.  EKG nonacute.   6/24.  Patient has right eye visual disturbance secondary to stroke.  CT angiogram does not show any large vessel occlusion.  Echocardiogram ordered.  Patient on aspirin, Lipitor and Plavix.

## 2023-12-19 NOTE — H&P (Addendum)
 History and Physical    Patient: Ashley Collins FMW:[id number] DOB: 19-Mar-1961 DOA: 12/18/2023 DOS: the patient was seen and examined on 12/19/2023 PCP: Harvey Gaetana CROME, NP  Patient coming from: Home  Chief Complaint:  Chief Complaint  Patient presents with   Near Syncope    HPI: Ashley Collins is a 63 y.o. female with medical history significant for Migraines with aura, being admitted with acute PCA infarct after presenting with lightheadedness weakness and vomiting and right visual field deficits.  Symptoms started at 8 PM on 12/18/2023.  She came in as a code stroke.  Initial head CT negative and she was seen in consultation by teleneurology who initially considered atypical migraine however MRI returned with PCA infarct.  NIH was 1.  tPA was discussed but patient was apprehensive.  She was loaded with aspirin and Plavix and given atorvastatin and admission requested. She also received a migraine cocktail in the ED Other workup in the ED notable for potassium of 3.3, creatinine of 1.63 up from baseline of 0.78, WBC 13.1 and many bacteria in her urine.  Troponin 5, EtOH less than 15.  EKG nonacute.     Review of Systems: As mentioned in the history of present illness. All other systems reviewed and are negative.  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   Cardiac arrhythmia    Nonspecific ST T wave changes on EKG   Chronic venous insufficiency of lower extremity    Complication of anesthesia    nausea and vomiting   DDD (degenerative disc disease), lumbosacral    Essential hypertension    Headache    History of kidney stones    Hyperlipidemia    Kidney stones    Lymphedema    Migraines    Osteoporosis    PONV (postoperative nausea and vomiting)    Right ureteral stone    Vitamin B12 deficiency    Vitamin D deficiency    Past Surgical History:  Procedure Laterality Date   AUGMENTATION MAMMAPLASTY      CESAREAN SECTION     x 4   COLONOSCOPY WITH PROPOFOL  N/A 03/31/2022   Procedure: COLONOSCOPY WITH PROPOFOL ;  Surgeon: Onita Elspeth Sharper, DO;  Location: [facility] ENDOSCOPY;  Service: Gastroenterology;  Laterality: N/A;   CYSTOSCOPY W/ RETROGRADES Bilateral 07/10/2020   Procedure: CYSTOSCOPY WITH RETROGRADE PYELOGRAM;  Surgeon: Francisca Redell BROCKS, MD;  Location: [facility] ORS;  Service: Urology;  Laterality: Bilateral;   CYSTOSCOPY/URETEROSCOPY/HOLMIUM LASER/STENT PLACEMENT     CYSTOSCOPY/URETEROSCOPY/HOLMIUM LASER/STENT PLACEMENT Bilateral 07/10/2020   Procedure: CYSTOSCOPY/URETEROSCOPY/HOLMIUM LASER/STENT PLACEMENT;  Surgeon: Francisca Redell BROCKS, MD;  Location: [facility] ORS;  Service: Urology;  Laterality: Bilateral;   CYSTOSCOPY/URETEROSCOPY/HOLMIUM LASER/STENT PLACEMENT Right 08/25/2023   Procedure: CYSTOSCOPY/URETEROSCOPY/HOLMIUM LASER;  Surgeon: Francisca Redell BROCKS, MD;  Location: [facility];  Service: Urology;  Laterality: Right;   CYSTOSCOPY/URETEROSCOPY/HOLMIUM LASER/STENT PLACEMENT Right 12/15/2023   Procedure: CYSTOSCOPY/URETEROSCOPY/HOLMIUM LASER;  Surgeon: Francisca Redell BROCKS, MD;  Location: [facility] OR[facility];  Service: Urology;  Laterality: Right;   EXTRACORPOREAL SHOCK WAVE LITHOTRIPSY     x 10 plus   Eye Lift     FACIAL COSMETIC SURGERY     GANGLION CYST EXCISION Right 12/21/2021   Procedure: REMOVAL GANGLION OF WRIST;  Surgeon: Kathlynn Sharper, MD;  Location: [facility] OR[facility];  Service: Orthopedics;  Laterality: Right;   LIPOSUCTION     TONSILLECTOMY     TRIGGER FINGER RELEASE Right 12/21/2021  Procedure: RELEASE TRIGGER FINGER/A-1 PULLEY;  Surgeon: Kathlynn Sharper, MD;  Location: [facility] ORS;  Service: Orthopedics;  Laterality: Right;   URETEROSCOPY WITH HOLMIUM LASER LITHOTRIPSY     Social History:  reports that she has never smoked. She has never been exposed to tobacco smoke. She has never used smokeless tobacco. She reports that she does not currently use alcohol. She reports that she does not currently use  drugs.  Allergies  Allergen Reactions   Percocet [Oxycodone -Acetaminophen ] Itching    Pt tolerates both oxycodone  and tylenol  individually     Family History  Problem Relation Age of Onset   Drug abuse Daughter    Depression Daughter    Depression Son    Bladder Cancer Neg Hx    Kidney cancer Neg Hx    Prostate cancer Neg Hx     Prior to Admission medications   Medication Sig Start Date End Date Taking? Authorizing Provider  AIMOVIG 140 MG/ML SOAJ Inject 140 mg as directed every 28 (twenty-eight) days. 08/14/23   [provider]  amitriptyline  (ELAVIL ) 75 MG tablet Take 1 tablet (75 mg total) by mouth at bedtime. Got it from the primary care Patient not taking: Reported on 12/15/2023 04/03/23   Eappen, Saramma, MD  cyanocobalamin (VITAMIN B12) 1000 MCG/ML injection Inject 1,000 mcg into the muscle every 30 (thirty) days. 03/24/22   [provider]  estradiol (ESTRACE) 1 MG tablet Take 1 mg by mouth at bedtime. 08/04/23   [provider]  HYDROcodone -acetaminophen  (NORCO/VICODIN) 5-325 MG tablet Take 1 tablet by mouth every 6 (six) hours as needed for moderate pain (pain score 4-6). 11/29/23   Helon Kirsch A, PA-C  hydrOXYzine  (VISTARIL ) 25 MG capsule Take 25 mg by mouth daily as needed for anxiety.    [provider]  lamoTRIgine  (LAMICTAL ) 25 MG tablet Take 1 tablet (25 mg total) by mouth daily. Patient not taking: Reported on 12/15/2023 11/14/23   Eappen, Saramma, MD  LORazepam  (ATIVAN ) 0.5 MG tablet Take 1 tablet (0.5 mg total) by mouth as directed. Take 1 tablet up to 3-4 times a week only for severe anxiety attacks,pls limit use, 26 tablets must last 30 days 11/14/23 02/12/24  Eappen, Saramma, MD  naratriptan (AMERGE) 2.5 MG tablet Take 2.5 mg by mouth as needed for migraine. 01/16/23 01/16/24  [provider]  OnabotulinumtoxinA (BOTOX IM) Inject 1 Dose into the muscle every 3 (three) months.    [provider]  progesterone  (PROMETRIUM) 100 MG capsule Take 100 mg by mouth at bedtime. 08/04/23   [provider]  promethazine  (PHENERGAN ) 25 MG tablet Take 1 tablet (25 mg total) by mouth every 6 (six) hours as needed for nausea or vomiting. 11/28/23   Helon Kirsch A, PA-C  tiZANidine (ZANAFLEX) 4 MG tablet Take 4 mg by mouth daily as needed for muscle spasms. 11/16/23   [provider]  Vitamin D, Ergocalciferol, (DRISDOL) 1.25 MG (50000 UNIT) CAPS capsule Take 50,000 Units by mouth every Monday. 08/07/23   [provider]  zolpidem  (AMBIEN ) 10 MG tablet Take 1 tablet (10 mg total) by mouth at bedtime. 11/14/23 03/13/24  Eappen, Saramma, MD    Physical Exam: Vitals:   12/18/23 2300 12/19/23 0224 12/19/23 0224 12/19/23 0300  BP: (!) 163/86 (!) 150/62  (!) 133/53  Pulse: 77 71  75  Resp:  19  12  Temp:   98.3 F (36.8 C)   TempSrc:   Oral   SpO2: 98% 100%  98%  Weight:  Height:       Physical Exam Vitals and nursing note reviewed.  Constitutional:      General: She is not in acute distress. HENT:     Head: Normocephalic and atraumatic.   Cardiovascular:     Rate and Rhythm: Normal rate and regular rhythm.     Heart sounds: Normal heart sounds.  Pulmonary:     Effort: Pulmonary effort is normal.     Breath sounds: Normal breath sounds.  Abdominal:     Palpations: Abdomen is soft.     Tenderness: There is no abdominal tenderness.   Neurological:     Mental Status: Mental status is at baseline.     Labs on Admission: I have personally reviewed following labs and imaging studies  CBC: Recent Labs  Lab 12/18/23 2232  WBC 13.1*  NEUTROABS 9.1*  HGB 12.3  HCT 37.7  MCV 92.0  PLT 349   Basic Metabolic Panel: Recent Labs  Lab 12/18/23 2232  NA 138  K 3.3*  CL 105  CO2 22  GLUCOSE 121*  BUN 21  CREATININE 1.63*  CALCIUM 9.3   GFR: Estimated Creatinine Clearance: 27.6 mL/min (A) (by C-G formula based on SCr of 1.63 mg/dL (H)). Liver Function Tests: Recent  Labs  Lab 12/18/23 2232  AST 26  ALT 17  ALKPHOS 70  BILITOT <0.2  PROT 6.6  ALBUMIN 4.0   No results for input(s): LIPASE, AMYLASE in the last 168 hours. No results for input(s): AMMONIA in the last 168 hours. Coagulation Profile: Recent Labs  Lab 12/18/23 2232  INR 1.1   Cardiac Enzymes: No results for input(s): CKTOTAL, CKMB, CKMBINDEX, TROPONINI in the last 168 hours. BNP (last 3 results) No results for input(s): PROBNP in the last 8760 hours. HbA1C: No results for input(s): HGBA1C in the last 72 hours. CBG: Recent Labs  Lab 12/18/23 2250  GLUCAP 122*   Lipid Profile: Recent Labs    12/18/23 2232  CHOL 235*  HDL 50  LDLCALC 135*  TRIG 248*  CHOLHDL 4.7   Thyroid  Function Tests: Recent Labs    12/18/23 2232  TSH 1.455   Anemia Panel: No results for input(s): VITAMINB12, FOLATE, FERRITIN, TIBC, IRON, RETICCTPCT in the last 72 hours. Urine analysis:    Component Value Date/Time   COLORURINE STRAW (A) 12/19/2023 0046   APPEARANCEUR CLEAR (A) 12/19/2023 0046   APPEARANCEUR Hazy (A) 08/08/2023 1508   LABSPEC 1.001 (L) 12/19/2023 0046   PHURINE 7.0 12/19/2023 0046   GLUCOSEU NEGATIVE 12/19/2023 0046   HGBUR MODERATE (A) 12/19/2023 0046   BILIRUBINUR NEGATIVE 12/19/2023 0046   BILIRUBINUR Negative 08/08/2023 1508   KETONESUR NEGATIVE 12/19/2023 0046   PROTEINUR NEGATIVE 12/19/2023 0046   NITRITE NEGATIVE 12/19/2023 0046   LEUKOCYTESUR NEGATIVE 12/19/2023 0046    Radiological Exams on Admission: MR Brain W and Wo Contrast Result Date: 12/19/2023 CLINICAL DATA:  Initial evaluation for acute neuro deficit, stroke suspected. EXAM: MRI HEAD WITHOUT AND WITH CONTRAST TECHNIQUE: Multiplanar, multiecho pulse sequences of the brain and surrounding structures were obtained without and with intravenous contrast. CONTRAST:  5mL GADAVIST GADOBUTROL 1 MMOL/ML IV SOLN COMPARISON:  CT from 12/18/2023. FINDINGS: Brain: Cerebral volume  within normal limits. No significant cerebral white matter disease for age. Patchy small volume restricted diffusion seen involving the left occipital cortex, consistent with a small acute left PCA distribution infarct (series 5, images 20-15). No associated hemorrhage or mass effect. No other evidence for acute or subacute ischemia. Gray-white matter differentiation otherwise  maintained. No areas of chronic cortical infarction. No acute or chronic intracranial blood products. No mass lesion, midline shift or mass effect. No hydrocephalus or extra-axial fluid collection. Pituitary gland and suprasellar region within normal limits. No abnormal enhancement. Vascular: Major intracranial vascular flow voids are maintained. Skull and upper cervical spine: Craniocervical junction within normal limits. Bone marrow signal intensity normal. No scalp soft tissue abnormality. Sinuses/Orbits: Prior bilateral ocular lens replacement. Paranasal sinuses are largely clear. No mastoid effusion. Other: None. IMPRESSION: 1. 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 No associated hemorrhage or mass effect. 2. Otherwise normal brain MRI for age. Electronically Signed   By: Morene Hoard M.D.   On: 12/19/2023 03:08   CT HEAD CODE STROKE WO CONTRAST Result Date: 12/18/2023 CLINICAL DATA:  Code stroke. Initial evaluation for acute neuro deficit, stroke suspected. EXAM: CT HEAD WITHOUT CONTRAST TECHNIQUE: Contiguous axial images were obtained from the base of the skull through the vertex without intravenous contrast. RADIATION DOSE REDUCTION: This exam was performed according to the departmental dose-optimization program which includes automated exposure control, adjustment of the mA and/or kV according to patient size and/or use of iterative reconstruction technique. COMPARISON:  Prior MRI from 03/08/2022 FINDINGS: Brain: Cerebral volume within normal limits for patient age. No acute  intracranial hemorrhage. No acute large vessel territory infarct. No mass lesion, midline shift, or mass effect. Ventricles are normal in size without hydrocephalus. No extra-axial fluid collection. Vascular: No abnormal hyperdense vessel. Skull: Scalp soft tissues demonstrate no acute abnormality. Calvarium intact. Sinuses/Orbits: Globes and orbital soft tissues within normal limits. Visualized paranasal sinuses are largely clear. No significant mastoid effusion. ASPECTS [facility] Stroke Program Early CT Score) - Ganglionic level infarction (caudate, lentiform nuclei, internal capsule, insula, M1-M3 cortex): 7 - Supraganglionic infarction (M4-M6 cortex): 3 Total score (0-10 with 10 being normal): 10 IMPRESSION: 1. Normal head CT.  No acute intracranial abnormality. 2. ASPECTS is 10. Results were called by telephone at the time of interpretation on 12/18/2023 at 10:56 pm to provider Dr. Viviann, who verbally acknowledged these results. Electronically Signed   By: Morene Hoard M.D.   On: 12/18/2023 22:58   Data Reviewed for HPI: Relevant notes from primary care and specialist visits, past discharge summaries as available in EHR, including Care Everywhere. Prior diagnostic testing as pertinent to current admission diagnoses Updated medications and problem lists for reconciliation ED course, including vitals, labs, imaging, treatment and response to treatment Triage notes, nursing and pharmacy notes and ED provider's notes Notable results as noted above in HPI      Assessment and Plan: * Acute CVA (cerebrovascular accident) (HCC) Permissive hypertension for first 24-48 hrs post stroke onset: Prn Labetalol  IV or Vasotec IV If BP greater than 220/120  Statins for LDL goal less than 70 ASA 81mg  daily, Plavix 75mg  daily x 3 weeks then monotherapy thereafter Telemetry, echo with bubble study Avoid dextrose  containing fluids, Maintain euglycemia, euthermia Neuro checks q4 hrs x 24 hrs and then per  shift Head of bed 30 degrees Physical therapy/Occupational therapy/Speech therapy if failed dysphagia screen Neurology consult to follow   Leukocytosis Possible UTI Likely secondary to acute stroke, however bacteria seen on UA Keflex twice daily  AKI (acute kidney injury) (HCC) Received a fluid bolus in the ED of 1 L Continue to monitor  Hypokalemia Potassium chloride  40 mg p.o. x 1  Migraine with aura and without status migrainosus, not intractable Acute migraine Received a migraine cocktail in the ED with Toradol ,  Benadryl  Continue  tizanidine, hydrocodone .  Also on Aimovig Patient is followed at her migraine clinic Will hold off on triptan due to acute stroke      DVT prophylaxis: Lovenox  Consults: Neurology  Advance Care Planning:   Code Status: Prior   Family Communication: none  Disposition Plan: Back to previous home environment  Severity of Illness: The appropriate patient status for this patient is OBSERVATION. Observation status is judged to be reasonable and necessary in order to provide the required intensity of service to ensure the patient's safety. The patient's presenting symptoms, physical exam findings, and initial radiographic and laboratory data in the context of their medical condition is felt to place them at decreased risk for further clinical deterioration. Furthermore, it is anticipated that the patient will be medically stable for discharge from the hospital within 2 midnights of admission.   Author: Delayne LULLA Solian, MD 12/19/2023 3:57 AM  For on call review [email or web address].

## 2023-12-19 NOTE — Progress Notes (Signed)
 PHARMACIST - PHYSICIAN COMMUNICATION  CONCERNING:  Enoxaparin (Lovenox) for DVT Prophylaxis    RECOMMENDATION: Patient was prescribed enoxaprin 40mg  q24 hours for VTE prophylaxis.   Filed Weights   12/18/23 2225  Weight: 52.1 kg (114 lb 13.8 oz)    Body mass index is 21.01 kg/m.  Estimated Creatinine Clearance: 27.6 mL/min (A) (by C-G formula based on SCr of 1.63 mg/dL (H)).  Patient is candidate for enoxaparin 30mg  every 24 hours based on CrCl <85ml/min or Weight <45kg  DESCRIPTION: Pharmacy has adjusted enoxaparin dose per [facility] policy.  Patient is now receiving enoxaparin 30 mg every 24 hours   Rankin CANDIE Dills, PharmD, [facility] 12/19/2023 4:05 AM

## 2023-12-19 NOTE — Assessment & Plan Note (Signed)
-   Lipitor started 

## 2023-12-19 NOTE — Consult Note (Signed)
 NEUROLOGY CONSULT NOTE   Date of service: December 19, 2023 Patient Name: Ashley Collins MRN:  [id number] DOB:  1961-02-01 Chief Complaint: Left sided vision loss Requesting Provider: Josette Ade, MD  History of Present Illness  Ashley Collins is a 63 y.o. female with a PMHx of basal cell cancer, cardiac arrhythmia with nonspecific ST T wave changes on EKG, chronic lower extremity venous insufficiency, lumbar DDD, HTN, nephrolithiasis, HLD, lymphedema, migraines, osteoporosis, B12 and vitamin D deficiencies who presented to the ED yesterday night after a near-syncopal episode at work, that was associated with weakness and vomiting. Since then she has had decreased vision in the right half of the visual field of her right eye.    She was seen by Teleneurology. Exam revealed an NIHSS Score of 1 for questionable partial hemianopsia versus simply blurring of her visual fields. The deficit was felt by Teleneurology to be possibly binasal, though exam was limited. TNK as a potential treatment was discussed with the patient, which she declined. STAT MRI in the ED revealed a small region of scattered left PCA territory infarction. She was started on ASA, Plavix and atorvastatin. Stroke work up was recommended.   She states on bedside interview this evening that she has a mild headache to her left temple with associated tenderness. She endorses a two week history of muscle aches in her legs as well as left sided jaw claudication. She also reports having experienced genitourinary bleeding and undergoing kidney stone removal approximately 3-4 days ago.     ROS  Comprehensive ROS performed and pertinent positives documented in HPI    Past History   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   Cardiac arrhythmia    Nonspecific ST T wave changes on EKG   Chronic venous insufficiency of lower extremity    Complication of anesthesia     nausea and vomiting   DDD (degenerative disc disease), lumbosacral    Essential hypertension    Headache    History of kidney stones    Hyperlipidemia    Kidney stones    Lymphedema    Migraines    Osteoporosis    PONV (postoperative nausea and vomiting)    Right ureteral stone    Vitamin B12 deficiency    Vitamin D deficiency    Past Surgical History:  Procedure Laterality Date   AUGMENTATION MAMMAPLASTY     CESAREAN SECTION     x 4   COLONOSCOPY WITH PROPOFOL  N/A 03/31/2022   Procedure: COLONOSCOPY WITH PROPOFOL ;  Surgeon: Onita Elspeth Sharper, DO;  Location: [facility];  Service: Gastroenterology;  Laterality: N/A;   CYSTOSCOPY W/ RETROGRADES Bilateral 07/10/2020   Procedure: CYSTOSCOPY WITH RETROGRADE PYELOGRAM;  Surgeon: Francisca Redell BROCKS, MD;  Location: [facility];  Service: Urology;  Laterality: Bilateral;   CYSTOSCOPY/URETEROSCOPY/HOLMIUM LASER/STENT PLACEMENT     CYSTOSCOPY/URETEROSCOPY/HOLMIUM LASER/STENT PLACEMENT Bilateral 07/10/2020   Procedure: CYSTOSCOPY/URETEROSCOPY/HOLMIUM LASER/STENT PLACEMENT;  Surgeon: Francisca Redell BROCKS, MD;  Location: [facility];  Service: Urology;  Laterality: Bilateral;   CYSTOSCOPY/URETEROSCOPY/HOLMIUM LASER/STENT PLACEMENT Right 08/25/2023   Procedure: CYSTOSCOPY/URETEROSCOPY/HOLMIUM LASER;  Surgeon: Francisca Redell BROCKS, MD;  Location: [facility];  Service: Urology;  Laterality: Right;   CYSTOSCOPY/URETEROSCOPY/HOLMIUM LASER/STENT PLACEMENT Right 12/15/2023   Procedure: CYSTOSCOPY/URETEROSCOPY/HOLMIUM LASER;  Surgeon: Francisca Redell BROCKS, MD;  Location: [facility];  Service: Urology;  Laterality: Right;   EXTRACORPOREAL SHOCK WAVE LITHOTRIPSY     x 10 plus  Eye Lift     FACIAL COSMETIC SURGERY     GANGLION CYST EXCISION Right 12/21/2021   Procedure: REMOVAL GANGLION OF WRIST;  Surgeon: Kathlynn Sharper, MD;  Location: [facility];  Service: Orthopedics;  Laterality: Right;   LIPOSUCTION     TONSILLECTOMY     TRIGGER FINGER RELEASE Right 12/21/2021    Procedure: RELEASE TRIGGER FINGER/A-1 PULLEY;  Surgeon: Kathlynn Sharper, MD;  Location: [facility];  Service: Orthopedics;  Laterality: Right;   URETEROSCOPY WITH HOLMIUM LASER LITHOTRIPSY      Family History: Family History  Problem Relation Age of Onset   Drug abuse Daughter    Depression Daughter    Depression Son    Bladder Cancer Neg Hx    Kidney cancer Neg Hx    Prostate cancer Neg Hx     Social History  reports that she has never smoked. She has never been exposed to tobacco smoke. She has never used smokeless tobacco. She reports that she does not currently use alcohol. She reports that she does not currently use drugs.  Allergies  Allergen Reactions   Percocet [Oxycodone -Acetaminophen ] Itching    Pt tolerates both oxycodone  and tylenol  individually     Medications   Current Facility-Administered Medications:    [START ON 12/20/2023]  stroke: early stages of recovery book, , Does not apply, Once, Cleatus Delayne GAILS, MD   acetaminophen  (OFIRMEV ) IV 1,000 mg, 1,000 mg, Intravenous, Once, Viviann Pastor, MD   acetaminophen  (TYLENOL ) tablet 650 mg, 650 mg, Oral, Q4H PRN **OR** acetaminophen  (TYLENOL ) 160 MG/5ML solution 650 mg, 650 mg, Per Tube, Q4H PRN **OR** acetaminophen  (TYLENOL ) suppository 650 mg, 650 mg, Rectal, Q4H PRN, Cleatus Delayne GAILS, MD   aspirin EC tablet 81 mg, 81 mg, Oral, Daily, Duncan, Hazel V, MD   atorvastatin (LIPITOR) tablet 40 mg, 40 mg, Oral, Daily, Ward, Kristen N, DO, 40 mg at 12/19/23 0355   cephALEXin (KEFLEX) capsule 500 mg, 500 mg, Oral, Q12H, Cleatus Delayne V, MD, 500 mg at 12/19/23 2140   clopidogrel (PLAVIX) tablet 75 mg, 75 mg, Oral, Daily, Cleatus Delayne V, MD   enoxaparin (LOVENOX) injection 40 mg, 40 mg, Subcutaneous, Q24H, Merrill, Kristin A, RPH, 40 mg at 12/19/23 1028   HYDROcodone -acetaminophen  (NORCO/VICODIN) 5-325 MG per tablet 1 tablet, 1 tablet, Oral, Q6H PRN, Cleatus Delayne GAILS, MD, 1 tablet at 12/19/23 1203   LORazepam  (ATIVAN ) tablet 0.5 mg,  0.5 mg, Oral, PRN, Cleatus Delayne GAILS, MD   ondansetron  (ZOFRAN ) injection 4 mg, 4 mg, Intravenous, Q6H PRN, Cleatus Delayne GAILS, MD   tiZANidine (ZANAFLEX) tablet 4 mg, 4 mg, Oral, Daily PRN, Cleatus Delayne GAILS, MD   zolpidem  (AMBIEN ) tablet 5 mg, 5 mg, Oral, QHS, Cleatus Delayne GAILS, MD, 5 mg at 12/19/23 2140  No current facility-administered medications on file prior to encounter.   Current Outpatient Medications on File Prior to Encounter  Medication Sig Dispense Refill   AIMOVIG 140 MG/ML SOAJ Inject 140 mg as directed every 28 (twenty-eight) days.     estradiol (ESTRACE) 1 MG tablet Take 1 mg by mouth at bedtime.     lisinopril (ZESTRIL) 5 MG tablet Take 5 mg by mouth daily.     LORazepam  (ATIVAN ) 0.5 MG tablet Take 1 tablet (0.5 mg total) by mouth as directed. Take 1 tablet up to 3-4 times a week only for severe anxiety attacks,pls limit use, 26 tablets must last 30 days 26 tablet 2   naratriptan (AMERGE) 2.5 MG tablet Take 2.5 mg by mouth as  needed for migraine.     ondansetron  (ZOFRAN -ODT) 8 MG disintegrating tablet Take 8 mg by mouth 3 (three) times daily.     progesterone (PROMETRIUM) 100 MG capsule Take 100 mg by mouth at bedtime.     promethazine  (PHENERGAN ) 25 MG tablet Take 1 tablet (25 mg total) by mouth every 6 (six) hours as needed for nausea or vomiting. 30 tablet 0   tiZANidine (ZANAFLEX) 4 MG tablet Take 4 mg by mouth daily as needed for muscle spasms.     zolpidem  (AMBIEN ) 10 MG tablet Take 1 tablet (10 mg total) by mouth at bedtime. 30 tablet 3   amitriptyline  (ELAVIL ) 75 MG tablet Take 1 tablet (75 mg total) by mouth at bedtime. Got it from the primary care (Patient not taking: Reported on 12/15/2023)     cyanocobalamin (VITAMIN B12) 1000 MCG/ML injection Inject 1,000 mcg into the muscle every 30 (thirty) days. (Patient not taking: Reported on 12/19/2023)     HYDROcodone -acetaminophen  (NORCO/VICODIN) 5-325 MG tablet Take 1 tablet by mouth every 6 (six) hours as needed for moderate pain  (pain score 4-6). (Patient not taking: Reported on 12/19/2023) 10 tablet 0   hydrOXYzine  (VISTARIL ) 25 MG capsule Take 25 mg by mouth daily as needed for anxiety. (Patient not taking: Reported on 12/19/2023)     lamoTRIgine  (LAMICTAL ) 25 MG tablet Take 1 tablet (25 mg total) by mouth daily. (Patient not taking: Reported on 12/15/2023) 30 tablet 1   OnabotulinumtoxinA (BOTOX IM) Inject 1 Dose into the muscle every 3 (three) months.     Vitamin D, Ergocalciferol, (DRISDOL) 1.25 MG (50000 UNIT) CAPS capsule Take 50,000 Units by mouth every Monday. (Patient not taking: Reported on 12/19/2023)       Vitals   Vitals:   12/19/23 0823 12/19/23 1137 12/19/23 1608 12/19/23 1946  BP: (!) 148/71 (!) 159/83 (!) 168/87 (!) 195/89  Pulse: 63 62 65 66  Resp: 16 16 18    Temp: 98 F (36.7 C) 98.1 F (36.7 C) 98.3 F (36.8 C) (!) 97.5 F (36.4 C)  TempSrc:   Oral   SpO2: 100% 100% 100% 100%  Weight:      Height:        Body mass index is 24.11 kg/m.   Physical Exam   Physical Exam HEENT- [place]/AT   Lungs- Respirations unlabored Extremities- Warm and well-perfused  Neurological Examination Mental Status: Awake and alert. Fully oriented. Thought content appropriate. Speech fluent without evidence of aphasia.  Able to follow all commands without difficulty. Cranial Nerves: II: No visual field cut OU with all 4 quadrants tested individually in each eye. She does endorse seeing only the left half of a field of text when attempting to read. Despite this, there is no extinction in her temporal fields when testing DSS. PERRL. III,IV, VI: No ptosis. EOMI. No nystagmus. V: Temp sensation equal bilaterally VII: Smile symmetric VIII: Hearing intact to voice IX,X: No hypophonia or hoarseness XI: Symmetric XII: Midline tongue extension Motor: RUE: 5/5 LUE: 5/5 RLE: 5/5 LLE: 5/5 Sensory: Temp and FT intact x 4. No extinction to DSS. Deep Tendon Reflexes: 2+ and symmetric bilateral biceps,  brachioradialis, patellae and achilles reflexes.  Cerebellar: No ataxia with FNF or H-S bilaterally Gait: Deferred   Labs/Imaging/Neurodiagnostic studies   CBC:  Recent Labs  Lab 2023-12-19 2232  WBC 13.1*  NEUTROABS 9.1*  HGB 12.3  HCT 37.7  MCV 92.0  PLT 349   Basic Metabolic Panel:  Lab Results  Component Value Date  NA 141 12/19/2023   K 3.3 (L) 12/19/2023   CO2 24 12/19/2023   GLUCOSE 97 12/19/2023   BUN 17 12/19/2023   CREATININE 1.02 (H) 12/19/2023   CALCIUM 8.4 (L) 12/19/2023   GFRNONAA >60 12/19/2023   Lipid Panel:  Lab Results  Component Value Date   LDLCALC 135 (H) 12/18/2023   HgbA1c:  Lab Results  Component Value Date   HGBA1C 4.6 (L) 12/18/2023   Urine Drug Screen:     Component Value Date/Time   LABOPIA NONE DETECTED 06/07/2021 1424   COCAINSCRNUR Negative 04/05/2023 1520   COCAINSCRNUR NONE DETECTED 06/07/2021 1424   LABBENZ POSITIVE (A) 06/07/2021 1424   AMPHETMU NONE DETECTED 06/07/2021 1424   THCU NONE DETECTED 06/07/2021 1424   LABBARB Negative 04/05/2023 1520   LABBARB NONE DETECTED 06/07/2021 1424    Alcohol Level     Component Value Date/Time   ETH <15 12/19/2023 0050   INR  Lab Results  Component Value Date   INR 1.1 12/18/2023   APTT  Lab Results  Component Value Date   APTT 26 12/18/2023     ASSESSMENT  63 y.o. female with a PMHx of basal cell cancer, cardiac arrhythmia with nonspecific ST T wave changes on EKG, chronic lower extremity venous insufficiency, lumbar DDD, HTN, nephrolithiasis, HLD, lymphedema, migraines, osteoporosis, B12 and vitamin D deficiencies who presented to the ED yesterday night after a near-syncopal episode at work, that was associated with weakness and vomiting. Since then she has had decreased vision in the right half of the visual field of her right eye. Of note, she states on bedside interview this evening that she has a mild headache to her left temple with associated tenderness. She endorses a  two week history of muscle aches in her legs as well as left sided jaw claudication.  - Exam reveals no focal neurological deficit, except for patient endorsing seeing only the left half of a field of text when attempting to read. Despite this, there is no extinction in her temporal fields when testing DSS and visual fields to confrontation testing are full. - CT head:  Normal head CT.  No acute intracranial abnormality. ASPECTS is 10. - CTA of head and neck: Negative for large vessel occlusion. Head and Neck atherosclerosis in the form of intermittent soft plaque. Mild irregularity at the Left PCA origin, and up to 50% stenosis of the Right ICA siphon, but no Hemodynamically significant stenosis identified. - MRI brain: Small acute left PCA distribution infarct involving the left occipital cortex. No associated hemorrhage or mass effect. Otherwise normal brain MRI for age. - EKG: Sinus rhythm with short PR; Nonspecific ST abnormality. When compared with ECG of 18-Aug-2023, no significant change was found - Cholesterol panel reveals elevated total cholesterol, LDL, triglycerides and VLDL with normal HDL.  - Impression: Acute left occipital lobe stroke. DDx for underlying etiology includes intracranial atherosclerotic disease and possible paroxysmal atrial fibrillation. Given her temporal headache with tenderness to palpation, as well as a complaint of recent muscle aches and left sided jaw claudication, giant cell arteritis is also on the DDx.   RECOMMENDATIONS  - ESR and CRP. If positive, may need a left temporal artery biopsy.  - DAPT with ASA and Plavix x 21 days, then ASA monotherapy thereafter.  - Agree with starting atorvastatin 40 mg po every day.  - Obtain baseline CK level.  - BP management per standard protocol. Now out of the permissive HTN time window.  - TTE - Cardiac  telemetry - Encourage PO hydration - Nutrition consult - PT consult, OT consult, Speech consult - Risk factor  modification - Frequent neuro checks - NPO until passes stroke swallow screen   ______________________________________________________________________    Bonney SHARK, Taison Celani, MD [facility]

## 2023-12-19 NOTE — Hospital Course (Signed)
 SABRA

## 2023-12-19 NOTE — Progress Notes (Signed)
 PT Cancellation Note  Patient Details Name: Brittain Smithey MRN: [id number] DOB: 1961-02-04   Cancelled Treatment:    Reason Eval/Treat Not Completed: Other (comment);PT screened, no needs identified, will sign off (Spoke with patient who declined PT needs at this time. Also discussed with OT who agrees patient with no PT needs. PT will sign off.)  Randine Essex, PT, MPT  Randine LULLA Essex 12/19/2023, 2:05 PM

## 2023-12-19 NOTE — Assessment & Plan Note (Addendum)
 Replace potassium again

## 2023-12-19 NOTE — Assessment & Plan Note (Signed)
 Holding lisinopril but may need to restart tomorrow.

## 2023-12-20 ENCOUNTER — Observation Stay (HOSPITAL_BASED_OUTPATIENT_CLINIC_OR_DEPARTMENT_OTHER)
Admit: 2023-12-20 | Discharge: 2023-12-20 | Disposition: A | Discharge status: Home / Self Care | Attending: Internal Medicine | Admitting: Internal Medicine

## 2023-12-20 DIAGNOSIS — I6389 Other cerebral infarction: Secondary | ICD-10-CM | POA: Diagnosis not present

## 2023-12-20 DIAGNOSIS — I639 Cerebral infarction, unspecified: Secondary | ICD-10-CM | POA: Diagnosis not present

## 2023-12-20 LAB — ECHOCARDIOGRAM COMPLETE BUBBLE STUDY
AR max vel: 3 cm2
AV Area VTI: 3.52 cm2
AV Area mean vel: 2.84 cm2
AV Mean grad: 2.5 mmHg
AV Peak grad: 4.4 mmHg
Ao pk vel: 1.05 m/s
Area-P 1/2: 2.99 cm2
MV VTI: 2.98 cm2
S' Lateral: 2.3 cm

## 2023-12-20 LAB — C-REACTIVE PROTEIN: CRP: <0.5 mg/dL (ref <1.0)

## 2023-12-20 LAB — CK: Total CK: 30 U/L — ABNORMAL LOW (ref 38–234)

## 2023-12-20 LAB — SEDIMENTATION RATE: Sed Rate: 6 mm/h (ref 0–30)

## 2023-12-20 MED ORDER — ASPIRIN 81 MG PO TBEC: 81.0000 mg | DELAYED_RELEASE_TABLET | Freq: Every day | ORAL | 12 refills | Status: DC

## 2023-12-20 MED ORDER — POLYETHYLENE GLYCOL 3350 17 G PO PACK
17.0000 g | PACK | Freq: Every day | ORAL | Status: DC
  Administered 2023-12-20: 17 g via ORAL
  Filled 2023-12-20: qty 1

## 2023-12-20 MED ORDER — CLOPIDOGREL BISULFATE 75 MG PO TABS: 75.0000 mg | ORAL_TABLET | Freq: Every day | ORAL | 0 refills | Status: AC

## 2023-12-20 MED ORDER — LISINOPRIL 10 MG PO TABS: 10.0000 mg | ORAL_TABLET | Freq: Every day | ORAL | 0 refills | Status: DC

## 2023-12-20 MED ORDER — ATORVASTATIN CALCIUM 40 MG PO TABS: 40.0000 mg | ORAL_TABLET | Freq: Every day | ORAL | 0 refills | Status: AC

## 2023-12-20 MED ORDER — CLOPIDOGREL BISULFATE 75 MG PO TABS: 75.0000 mg | ORAL_TABLET | Freq: Every day | ORAL | 0 refills | Status: DC

## 2023-12-20 MED ORDER — ASPIRIN 81 MG PO TBEC: 81.0000 mg | DELAYED_RELEASE_TABLET | Freq: Every day | ORAL | 12 refills | Status: AC

## 2023-12-20 MED ORDER — ATORVASTATIN CALCIUM 40 MG PO TABS: 40.0000 mg | ORAL_TABLET | Freq: Every day | ORAL | 0 refills | Status: DC

## 2023-12-20 NOTE — Plan of Care (Signed)
  Problem: Education: Goal: Knowledge of disease or condition will improve Outcome: Progressing   Problem: Ischemic Stroke/TIA Tissue Perfusion: Goal: Complications of ischemic stroke/TIA will be minimized Outcome: Progressing   Problem: Self-Care: Goal: Ability to participate in self-care as condition permits will improve Outcome: Progressing   Problem: Clinical Measurements: Goal: Ability to maintain clinical measurements within normal limits will improve Outcome: Progressing   Problem: Activity: Goal: Risk for activity intolerance will decrease Outcome: Progressing   Problem: Skin Integrity: Goal: Risk for impaired skin integrity will decrease Outcome: Progressing

## 2023-12-20 NOTE — Progress Notes (Signed)
 Occupational Therapy Treatment Patient Details Name: Ashley Collins MRN: [id number] DOB: 1960-07-11 Today's Date: 12/20/2023   History of present illness Pt is a 63 y.o. female who presents to the emergency department with a near syncopal event. Patient states she was at work when she suddenly felt lightheaded, nauseated and then had blurry vision, loss of vision in the right eye then also involve the left eye. MRI: Small acute left PCA distribution infarct involving the left  occipital cortex. No associated hemorrhage or mass effect. PMH: hypertension, hyperlipidemia, kidney stones   OT comments  Ms Vazguez was seen for OT treatment on this date. Upon arrival to room pt in bed, agreeable to tx. Pt currenlty MOD I with use of hand rail use for tub shower t/f. Increased time for funcitonal mobility and funcitonal reaching task asjusting blinds above head height. Completed visual scanning task identifying 88 in list of numbers. Pt correctly identified 25/31 with increased errors as pt reports eye fatigue towards end of task. Pt utilized adapted strategy of using hands to section off words. Accurately reads 1 sentence with same technique. Reviewed visual scanning strategies with good return demonstrations. Pt will benefit from 1 follow up session to continue visual scanning education. Discharge recommendation remains appropriate.        If plan is discharge home, recommend the following:  Assist for transportation   Equipment Recommendations  None recommended by OT    Recommendations for Other Services      Precautions / Restrictions Precautions Precautions: None Restrictions Weight Bearing Restrictions Per Provider Order: No       Mobility Bed Mobility Overal bed mobility: Independent                  Transfers Overall transfer level: Independent                       Balance Overall balance assessment: Mild deficits observed, not formally tested                                          ADL either performed or assessed with clinical judgement   ADL Overall ADL's : Modified independent                                       General ADL Comments: hand rail use for tub shower t/f. Increased time for funcitonal mobility and funcitonal reaching task asjusting blinds above head height.    Extremity/Trunk Assessment              Vision       Perception     Praxis     Communication     Cognition Arousal: Alert Behavior During Therapy: WFL for tasks assessed/performed Cognition: No apparent impairments                               Following commands: Intact        Cueing      Exercises      Shoulder Instructions       General Comments      Pertinent Vitals/ Pain       Pain Assessment Pain Assessment: No/denies pain  Home Living  Prior Functioning/Environment              Frequency  Min 2X/week        Progress Toward Goals  OT Goals(current goals can now be found in the care plan section)  Progress towards OT goals: Progressing toward goals  Acute Rehab OT Goals Patient Stated Goal: to return to work OT Goal Formulation: With patient Time For Goal Achievement: 01/02/24 Potential to Achieve Goals: Good ADL Goals Pt Will Perform Tub/Shower Transfer: Shower transfer;Independently;ambulating Additional ADL Goal #1: Pt will verbalize visual compensatory strategies in 3/3 trials. Additional ADL Goal #2: Pt will verbalize x3 strategies for safe navigation at home.  Plan      Co-evaluation                 AM-PAC OT 6 Clicks Daily Activity     Outcome Measure   Help from another person eating meals?: None Help from another person taking care of personal grooming?: None Help from another person toileting, which includes using toliet, bedpan, or urinal?: None Help from another person bathing  (including washing, rinsing, drying)?: None Help from another person to put on and taking off regular upper body clothing?: None Help from another person to put on and taking off regular lower body clothing?: None 6 Click Score: 24    End of Session    OT Visit Diagnosis: Other symptoms and signs involving the nervous system (R29.898)   Activity Tolerance Patient tolerated treatment well   Patient Left in bed   Nurse Communication          Time: 9041-8978 OT Time Calculation (min): 23 min  Charges: OT General Charges $OT Visit: 1 Visit OT Treatments $Self Care/Home Management : 8-22 mins $Therapeutic Activity: 8-22 mins  Elston Slot, M.S. OTR/L  12/20/23, 11:22 AM  ascom [phone]

## 2023-12-20 NOTE — Progress Notes (Signed)
     [facility] REFERRAL        Occupational Therapy * Physical Therapy * Speech Therapy                           DATE [birth year]  PATIENT NAME Ashley Collins  PATIENT MRN [id number]        DIAGNOSIS/DIAGNOSIS CODE I63.9  DATE OF DISCHARGE: 12/20/2023       PRIMARY CARE PHYSICIAN       Harvey Gaetana CROME, NP    General - Family Medicine    [phone]   PCP PHONE/FAX   [phone]   Dear Provider (Name: [facility] outpatient __  Fax: [phone]   I certify that I have examined this patient and that occupational/physical/speech therapy is necessary on an outpatient basis.    The patient has expressed interest in completing their recommended course of therapy at your  location.  Once a formal order from the patient's primary care physician has been obtained, please  contact him/her to schedule an appointment for evaluation at your earliest convenience.   [ ]   Physical Therapy Evaluate and Treat  [ x ]  Occupational Therapy Evaluate and Treat  [  ]  Speech Therapy Evaluate and Treat         The patient's primary care physician (listed above) must furnish and be responsible for a formal order such that the recommended services may be furnished while under the primary physician's care, and that the plan of care will be established and reviewed every 30 days (or more often if condition necessitates).

## 2023-12-20 NOTE — Progress Notes (Signed)
*  PRELIMINARY RESULTS* Echocardiogram 2D Echocardiogram has been performed.  Ashley Collins 12/20/2023, 10:03 AM

## 2023-12-20 NOTE — TOC Progression Note (Signed)
 Transition of Care [facility]) - Progression Note    Patient Details  Name: Appolonia Ackert MRN: [id number] Date of Birth: 1960/10/29  Transition of Care [facility]) CM/SW Contact  Luann SHAUNNA [place], [place] Phone Number: 12/20/2023, 2:26 PM  Clinical Narrative:      Pt agreeable to OPOT referral to [facility] clinic. Referral faxed [phone]       Expected Discharge Plan and Services         Expected Discharge Date: 12/20/23                                     Social Determinants of Health (SDOH) Interventions SDOH Screenings   Food Insecurity: No Food Insecurity (12/19/2023)  Housing: Low Risk  (12/19/2023)  Transportation Needs: No Transportation Needs (12/19/2023)  Utilities: Not At Risk (12/19/2023)  Depression (PHQ2-9): Low Risk  (04/04/2023)  Financial Resource Strain: Patient Declined (11/10/2023)   Received from [facility]  Social Connections: Moderately Integrated (12/19/2023)  Tobacco Use: Low Risk  (12/18/2023)  Recent Concern: Tobacco Use - Medium Risk (11/17/2023)   Received from [facility]    Readmission Risk Interventions     No data to display

## 2023-12-20 NOTE — Discharge Summary (Addendum)
 Physician Discharge Summary   Patient: Ashley Collins MRN: [id number] DOB: [birth year]  Admit date:     12/18/2023  Discharge date: 12/20/23  Discharge Physician: Lorane Poland   PCP: Harvey Gaetana CROME, NP   Recommendations at discharge:   Follow-up with ophthalmology for visual field testing Follow-up with primary care for continued monitoring of your blood pressure and cholesterol  Discharge Diagnoses: Principal Problem:   Acute CVA (cerebrovascular accident) (HCC) Active Problems:   AKI (acute kidney injury) (HCC)   Migraine with aura and without status migrainosus, not intractable   Hypokalemia   Leukocytosis   Hyperlipidemia, unspecified   Essential hypertension   Vision changes  Resolved Problems:   * No resolved hospital problems. *  Hospital Course: Ashley Collins is a 63 year old female with migraines with aura, GAD, insomnia, on HRT, who presents with lightheadedness, weakness, vomiting, right visual field deficits.  She initially presented as a code stroke.  Head CT was negative and she was seen in consultation with teleneurology who discussed tPA but patient was apprehensive.  She was loaded with aspirin and Plavix.  MRI confirmed PCA infarct.  She was admitted for further workup.  CTA negative for LVO.  Echocardiogram with grade 1 diastolic dysfunction but no other acute abnormalities.  Patient was found to have hyperlipidemia and elevated blood pressure.  She was started on statin, DAPT, and increased home dose lisinopril.  She will need to follow-up closely with primary care for continued blood pressure and cholesterol management.  She continues to have right-sided visual field deficits and was instructed not to drive.  She was referred to ophthalmology for continued vision monitoring outpatient. On day of discharge I discussed the care plan with the patient, as well as with her daughter Kendelyn an Char[employer] fundraiser.  They both endorse understanding.  Acute CVA - MRI confirms small acute  left PCA distribution infarct - LDL 135, started on statin - Neurology consulted. 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  Patient has been instructed to stop driving until cleared by ophthalmology or neurology. - Ophthalmology referral placed at discharge  Heart failure preserved EF - Echocardiogram with grade 1 diastolic dysfunction.  EF 60 to 65% - Clinically euvolemic - Advised tight blood pressure control as above  Migraine with aura - Has resolved at time of discharge - Have advised to discontinue aimvog - Follow-up with [facility] where she is previously established to discuss alternative medications  AKI - Initial creatinine 1.63, down trended to 1.02  Hypokalemia, replaced  Leukocytosis - Likely reactive from stroke.  Afebrile. - No CRP/ESR elevations  Hypertension - Has been on amlodipine which was recently discontinued due to lower extremity swelling and started on 5 mg lisinopril - Increase lisinopril to 10 mg, advised to keep blood pressure log and follow-up with PCP for further medication titration  Hyperlipidemia - LDL 135, triglycerides 248 - Have started on statin - Follow-up with PCP for continued monitoring  GAD Insomnia - Patient reports that she has recently discontinued all of her home medications.  She follows with psychiatry.  Will defer further prescribing to her established psychiatrist        Consultants: Neurology Procedures performed: n/a  Disposition: Home Diet recommendation:  Discharge Diet Orders (From admission, onward)     Start     Ordered   12/20/23 0000  Diet general        12/20/23 1321           Regular diet DISCHARGE MEDICATION: Allergies as of 12/20/2023       Reactions   Percocet [oxycodone -acetaminophen ] Itching   Pt tolerates  both oxycodone  and tylenol  individually         Medication List     STOP taking these medications    Aimovig 140 MG/ML Soaj Generic drug: Erenumab-aooe   amitriptyline  75 MG tablet Commonly known as: ELAVIL    cyanocobalamin 1000 MCG/ML injection Commonly known as: VITAMIN B12   estradiol 1 MG tablet Commonly known as: ESTRACE   HYDROcodone -acetaminophen  5-325 MG tablet Commonly known as: NORCO/VICODIN   hydrOXYzine  25 MG capsule Commonly known as: VISTARIL    lamoTRIgine  25 MG tablet Commonly known as: LaMICtal    naratriptan 2.5 MG tablet Commonly known as: AMERGE   progesterone 100 MG capsule Commonly known as: PROMETRIUM   Vitamin D (Ergocalciferol) 1.25 MG (50000 UNIT) Caps capsule Commonly known as: DRISDOL       TAKE these medications    aspirin EC 81 MG tablet Take 1 tablet (81 mg total) by mouth daily. Swallow whole. Start taking on: December 21, 2023   atorvastatin 40 MG tablet Commonly known as: LIPITOR Take 1 tablet (40 mg total) by mouth daily. Start taking on: December 21, 2023   BOTOX IM Inject 1 Dose into the muscle every 3 (three) months.   clopidogrel 75 MG tablet Commonly known as: PLAVIX Take 1 tablet (75 mg total) by mouth daily for 21 days. Start taking on: December 21, 2023   lisinopril 10 MG tablet Commonly known as: ZESTRIL Take 1 tablet (10 mg total) by mouth daily. What changed:  medication strength how much to take   LORazepam  0.5 MG tablet Commonly known as: ATIVAN  Take 1 tablet (0.5 mg total) by mouth as directed. Take 1 tablet up to 3-4 times a week only for severe anxiety attacks,pls limit use, 26 tablets must last 30 days   ondansetron  8 MG disintegrating tablet Commonly known as: ZOFRAN -ODT Take 8 mg by mouth 3 (three) times daily.   promethazine  25 MG tablet Commonly known as: PHENERGAN  Take 1 tablet (25 mg total) by mouth every 6 (six) hours as needed for nausea or vomiting.   tiZANidine 4 MG tablet Commonly known  as: ZANAFLEX Take 4 mg by mouth daily as needed for muscle spasms.   zolpidem  10 MG tablet Commonly known as: AMBIEN  Take 1 tablet (10 mg total) by mouth at bedtime.        Follow-up Information     Fields, Gaetana CROME, NP. Schedule an appointment as soon as possible for a visit.   Specialty: Family Medicine Why: hospital follow up Contact information: [address] [place] [address] [phone]                Discharge Exam: Fredricka Weights   12/18/23 2225 12/19/23 0508  Weight: 52.1 kg 59.8 kg   Constitutional:  Normal appearance. Non toxic-appearing.  HENT: Head Normocephalic and atraumatic.  Mucous membranes are moist.  Eyes:  Extraocular intact. Conjunctivae normal. Pupils are equal, round, and reactive to light.  Cardiovascular: Rate and Rhythm: Normal rate and regular rhythm.  Pulmonary: Non labored, symmetric rise of chest wall.  Musculoskeletal:  Normal range of motion.  Skin: warm and dry. not jaundiced.  Neurological alert. Oriented. Psychiatric: Mood and Affect congruent.    Condition at discharge: stable  The results of  significant diagnostics from this hospitalization (including imaging, microbiology, ancillary and laboratory) are listed below for reference.   Imaging Studies: ECHOCARDIOGRAM COMPLETE BUBBLE STUDY Result Date: 12/20/2023    ECHOCARDIOGRAM REPORT   Patient Name:   BIVIANA SADDLER Date of Exam: 12/20/2023 Medical Rec #:  [id number]    Height:       62.0 in Accession #:    [id number]   Weight:       131.8 lb Date of Birth:  [birth year]     BSA:          1.601 m Patient Age:    62 years     BP:           139/69 mmHg Patient Gender: F            HR:           69 bpm. Exam Location:  [facility] Procedure: 2D Echo, Cardiac Doppler, Color Doppler and Saline Contrast Bubble            Study (Both Spectral and Color Flow Doppler were utilized during            procedure). Indications:     Stroke 434.91 / I63.9  History:         Patient has no prior  history of Echocardiogram examinations.                  Risk Factors:Dyslipidemia. Headaches, migraines.  Sonographer:     Christopher Furnace Referring Phys:  [id number] DELAYNE LULLA SOLIAN Diagnosing Phys: Evalene Lunger MD  Sonographer Comments: Image acquisition challenging due to breast implants. IMPRESSIONS  1. Left ventricular ejection fraction, by estimation, is 60 to 65%. Left ventricular ejection fraction by PLAX is 59 %. The left ventricle has normal function. The left ventricle has no regional wall motion abnormalities. Left ventricular diastolic parameters are consistent with Grade I diastolic dysfunction (impaired relaxation).  2. Right ventricular systolic function is normal. The right ventricular size is normal.  3. The mitral valve is normal in structure. No evidence of mitral valve regurgitation. No evidence of mitral stenosis.  4. The aortic valve has an indeterminant number of cusps. Aortic valve regurgitation is not visualized. No aortic stenosis is present.  5. The inferior vena cava is normal in size with greater than 50% respiratory variability, suggesting right atrial pressure of 3 mmHg.  6. Agitated saline contrast bubble study was negative, with no evidence of any interatrial shunt. FINDINGS  Left Ventricle: Left ventricular ejection fraction, by estimation, is 60 to 65%. Left ventricular ejection fraction by PLAX is 59 %. The left ventricle has normal function. The left ventricle has no regional wall motion abnormalities. Strain was performed and the global longitudinal strain is indeterminate. The left ventricular internal cavity size was normal in size. There is no left ventricular hypertrophy. Left ventricular diastolic parameters are consistent with Grade I diastolic dysfunction  (impaired relaxation). Right Ventricle: The right ventricular size is normal. No increase in right ventricular wall thickness. Right ventricular systolic function is normal. Left Atrium: Left atrial size was normal in size.  Right Atrium: Right atrial size was normal in size. Pericardium: There is no evidence of pericardial effusion. Mitral Valve: The mitral valve is normal in structure. No evidence of mitral valve regurgitation. No evidence of mitral valve stenosis. MV peak gradient, 3.5 mmHg. The mean mitral valve gradient is 2.0 mmHg. Tricuspid Valve: The tricuspid valve is normal in structure. Tricuspid valve regurgitation is not demonstrated. No evidence of  tricuspid stenosis. Aortic Valve: The aortic valve has an indeterminant number of cusps. Aortic valve regurgitation is not visualized. No aortic stenosis is present. Aortic valve mean gradient measures 2.5 mmHg. Aortic valve peak gradient measures 4.4 mmHg. Aortic valve area, by VTI measures 3.52 cm. Pulmonic Valve: The pulmonic valve was normal in structure. Pulmonic valve regurgitation is not visualized. No evidence of pulmonic stenosis. Aorta: The aortic root is normal in size and structure. Venous: The inferior vena cava is normal in size with greater than 50% respiratory variability, suggesting right atrial pressure of 3 mmHg. IAS/Shunts: No atrial level shunt detected by color flow Doppler. Agitated saline contrast was given intravenously to evaluate for intracardiac shunting. Agitated saline contrast bubble study was negative, with no evidence of any interatrial shunt. There  is no evidence of a patent foramen ovale. There is no evidence of an atrial septal defect. Additional Comments: 3D was performed not requiring image post processing on an independent workstation and was indeterminate.  LEFT VENTRICLE PLAX 2D LV EF:         Left            Diastology                ventricular     LV e' medial:    5.98 cm/s                ejection        LV E/e' medial:  11.0                fraction by     LV e' lateral:   9.57 cm/s                PLAX is 59      LV E/e' lateral: 6.9                %. LVIDd:         3.30 cm LVIDs:         2.30 cm LV PW:         1.00 cm LV IVS:         1.20 cm LVOT diam:     2.00 cm LV SV:         68 LV SV Index:   43 LVOT Area:     3.14 cm  RIGHT VENTRICLE RV Basal diam:  2.40 cm RV Mid diam:    2.20 cm LEFT ATRIUM           Index        RIGHT ATRIUM          Index LA diam:      2.70 cm 1.69 cm/m   RA Area:     8.74 cm LA Vol (A2C): 16.1 ml 10.05 ml/m  RA Volume:   17.70 ml 11.05 ml/m LA Vol (A4C): 16.6 ml 10.37 ml/m  AORTIC VALVE AV Area (Vmax):    3.00 cm AV Area (Vmean):   2.84 cm AV Area (VTI):     3.52 cm AV Vmax:           104.50 cm/s AV Vmean:          74.750 cm/s AV VTI:            0.194 m AV Peak Grad:      4.4 mmHg AV Mean Grad:      2.5 mmHg LVOT Vmax:         99.80 cm/s LVOT Vmean:  67.500 cm/s LVOT VTI:          0.217 m LVOT/AV VTI ratio: 1.12  AORTA Ao Root diam: 2.20 cm MITRAL VALVE MV Area (PHT): 2.99 cm    SHUNTS MV Area VTI:   2.98 cm    Systemic VTI:  0.22 m MV Peak grad:  3.5 mmHg    Systemic Diam: 2.00 cm MV Mean grad:  2.0 mmHg MV Vmax:       0.93 m/s MV Vmean:      58.0 cm/s MV Decel Time: 254 msec MV E velocity: 66.00 cm/s MV A velocity: 79.10 cm/s MV E/A ratio:  0.83 Evalene Lunger MD Electronically signed by Evalene Lunger MD Signature Date/Time: 12/20/2023/12:36:52 PM    Final    CT ANGIO HEAD NECK W WO CM Result Date: 12/19/2023 CLINICAL DATA:  63 year old female code stroke presentation yesterday. Patchy Left PCA territory infarct by MRI this morning. EXAM: CT ANGIOGRAPHY HEAD AND NECK WITH AND WITHOUT CONTRAST TECHNIQUE: Multidetector CT imaging of the head and neck was performed using the standard protocol during bolus administration of intravenous contrast. Multiplanar CT image reconstructions and MIPs were obtained to evaluate the vascular anatomy. Carotid stenosis measurements (when applicable) are obtained utilizing NASCET criteria, using the distal internal carotid diameter as the denominator. RADIATION DOSE REDUCTION: This exam was performed according to the departmental dose-optimization program which  includes automated exposure control, adjustment of the mA and/or kV according to patient size and/or use of iterative reconstruction technique. CONTRAST:  75mL OMNIPAQUE  IOHEXOL  350 MG/ML SOLN COMPARISON:  Brain MRI 0139 hours today.  Head CT yesterday. FINDINGS: CT HEAD Brain: Small patchy areas of restricted diffusion in the left occipital lobe remain occult by CT. No midline shift, ventriculomegaly, mass effect, evidence of mass lesion, intracranial hemorrhage. Stable gray-white matter differentiation throughout the brain. Calvarium and skull base: Stable and intact. Paranasal sinuses: Visualized paranasal sinuses and mastoids are stable and well aerated. Orbits: No acute orbit or scalp soft tissue finding. CTA NECK Skeleton: No acute osseous abnormality identified. Upper chest: Partially visible breast implants. Negative upper lungs, visible mediastinum. Other neck: Nonvascular neck soft tissue spaces are within normal limits. Aortic arch: Normal 3 vessel arch. Right carotid system: Patent with mild atherosclerosis. No stenosis to the skull base. Left carotid system: Patent with mild atherosclerosis, most notably soft plaque at the proximal left ICA. No stenosis to the skull base. Vertebral arteries: Proximal right subclavian artery and right vertebral artery origin are normal. Right vertebral artery is patent and within normal limits to the skull base. Proximal left subclavian artery mild soft atherosclerosis without stenosis. Normal left vertebral artery origin. Patent and codominant left vertebral artery to the skull base with no plaque or stenosis. CTA HEAD Posterior circulation: Patent distal vertebral arteries, vertebrobasilar junction without stenosis. Patent left PICA origin, occurs distally. Right AICA appears to be dominant and patent. Patent basilar artery without stenosis. Patent SCA and PCA origins. Posterior communicating arteries are diminutive or absent. There is asymmetric mild irregularity at  the left PCA origin best seen on series 12, image 22. But bilateral PCA branches are patent without hemodynamically significant stenosis. Anterior circulation: Both ICA siphons are patent. Left siphon soft plaque suspected including in the petrous segment (series 10, image 130) with less than 50 % stenosis with respect to the distal vessel. Contralateral similar soft plaque of the right ICA siphon at the petrous and cavernous segment junction which does appear to result in up to 50% short segment stenosis there  on series 8, image 131. Distal right siphon remains patent, with probable atherosclerotic pseudo lesion or irregularity seen at the distal cavernous segment series 10, image 89. No convincing aneurysm. Patent carotid termini, MCA and ACA origins. Anterior communicating artery and bilateral ACA branches are within normal limits. Left MCA M1 segment and bifurcation are patent without stenosis. Right MCA M1 segment and bifurcation are patent without stenosis. Bilateral MCA branches are within normal limits. Venous sinuses: Patent. Anatomic variants: None significant. Review of the MIP images confirms the above findings IMPRESSION: 1. Negative for large vessel occlusion. Head and Neck atherosclerosis in the form of intermittent soft plaque. Mild irregularity at the Left PCA origin, and up to 50% stenosis of the Right ICA siphon, but no Hemodynamically significant stenosis identified. 2. Stable CT appearance of the brain. Patchy Left PCA infarcts on MRI remain occult by CT. Electronically Signed   By: VEAR Hurst M.D.   On: 12/19/2023 14:55   MR Brain W and Wo Contrast Result Date: 12/19/2023 CLINICAL DATA:  Initial evaluation for acute neuro deficit, stroke suspected. EXAM: MRI HEAD WITHOUT AND WITH CONTRAST TECHNIQUE: Multiplanar, multiecho pulse sequences of the brain and surrounding structures were obtained without and with intravenous contrast. CONTRAST:  5mL GADAVIST GADOBUTROL 1 MMOL/ML IV SOLN COMPARISON:   CT from 12/18/2023. FINDINGS: Brain: Cerebral volume within normal limits. No significant cerebral white matter disease for age. Patchy small volume restricted diffusion seen involving the left occipital cortex, consistent with a small acute left PCA distribution infarct (series 5, images 20-15). No associated hemorrhage or mass effect. No other evidence for acute or subacute ischemia. Gray-white matter differentiation otherwise maintained. No areas of chronic cortical infarction. No acute or chronic intracranial blood products. No mass lesion, midline shift or mass effect. No hydrocephalus or extra-axial fluid collection. Pituitary gland and suprasellar region within normal limits. No abnormal enhancement. Vascular: Major intracranial vascular flow voids are maintained. Skull and upper cervical spine: Craniocervical junction within normal limits. Bone marrow signal intensity normal. No scalp soft tissue abnormality. Sinuses/Orbits: Prior bilateral ocular lens replacement. Paranasal sinuses are largely clear. No mastoid effusion. Other: None. IMPRESSION: 1. 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 No associated hemorrhage or mass effect. 2. Otherwise normal brain MRI for age. Electronically Signed   By: Morene Hoard M.D.   On: 12/19/2023 03:08   CT HEAD CODE STROKE WO CONTRAST Result Date: 12/18/2023 CLINICAL DATA:  Code stroke. Initial evaluation for acute neuro deficit, stroke suspected. EXAM: CT HEAD WITHOUT CONTRAST TECHNIQUE: Contiguous axial images were obtained from the base of the skull through the vertex without intravenous contrast. RADIATION DOSE REDUCTION: This exam was performed according to the departmental dose-optimization program which includes automated exposure control, adjustment of the mA and/or kV according to patient size and/or use of iterative reconstruction technique. COMPARISON:  Prior MRI from 03/08/2022 FINDINGS: Brain: Cerebral volume  within normal limits for patient age. No acute intracranial hemorrhage. No acute large vessel territory infarct. No mass lesion, midline shift, or mass effect. Ventricles are normal in size without hydrocephalus. No extra-axial fluid collection. Vascular: No abnormal hyperdense vessel. Skull: Scalp soft tissues demonstrate no acute abnormality. Calvarium intact. Sinuses/Orbits: Globes and orbital soft tissues within normal limits. Visualized paranasal sinuses are largely clear. No significant mastoid effusion. ASPECTS [facility] Stroke Program Early CT Score) - Ganglionic level infarction (caudate, lentiform nuclei, internal capsule, insula, M1-M3 cortex): 7 - Supraganglionic infarction (M4-M6 cortex): 3 Total score (0-10 with 10 being normal): 10  IMPRESSION: 1. Normal head CT.  No acute intracranial abnormality. 2. ASPECTS is 10. Results were called by telephone at the time of interpretation on 12/18/2023 at 10:56 pm to provider Dr. Viviann, who verbally acknowledged these results. Electronically Signed   By: Morene Hoard M.D.   On: 12/18/2023 22:58   DG OR UROLOGY CYSTO IMAGE ([facility] ONLY) Result Date: 12/15/2023 There is no interpretation for this exam.  This order is for images obtained during a surgical procedure.  Please See Surgeries Tab for more information regarding the procedure.   DG Abd 1 View Result Date: 12/03/2023 CLINICAL DATA:  flank pain. EXAM: ABDOMEN - 1 VIEW COMPARISON:  08/08/2023. FINDINGS: There is a 4 x 7 mm calculus overlying the upper portion of left renal shadow. No other abnormal calcification noted overlying bilateral kidneys, ureters and urinary bladder region. The bowel gas pattern is non-obstructive. There is moderate stool burden, including the ascending colon, compatible with colonic hypomotility. No evidence of pneumoperitoneum, within the limitations of a supine film. No acute osseous abnormalities. The soft tissues are within normal limits. Surgical changes, devices,  tubes and lines: None. Other findings: None. IMPRESSION: Nonobstructive bowel gas pattern. Moderate stool burden. There is a 4 x 7 mm left renal calculus. Electronically Signed   By: Ree Molt M.D.   On: 12/03/2023 14:32   CT Renal Stone Study Result Date: 11/27/2023 CLINICAL DATA:  Abdominal and flank pain with stone suspected. Previous history of kidney stones. EXAM: CT ABDOMEN AND PELVIS WITHOUT CONTRAST TECHNIQUE: Multidetector CT imaging of the abdomen and pelvis was performed following the standard protocol without IV contrast. RADIATION DOSE REDUCTION: This exam was performed according to the departmental dose-optimization program which includes automated exposure control, adjustment of the mA and/or kV according to patient size and/or use of iterative reconstruction technique. COMPARISON:  Abdominal radiographs 08/08/2023. CT abdomen and pelvis 08/02/2023 FINDINGS: Lower chest: Lung bases are clear. Bilateral breast implants. Small esophageal hiatal hernia. Hepatobiliary: No focal liver abnormality is seen. No gallstones, gallbladder wall thickening, or biliary dilatation. Pancreas: Unremarkable. No pancreatic ductal dilatation or surrounding inflammatory changes. Spleen: Normal in size without focal abnormality. Adrenals/Urinary Tract: No adrenal gland nodules. Bilateral intrarenal stones. Largest is on the left measuring 10 mm diameter. Right-sided hydronephrosis and hydroureter with a 4 mm stone in the distal right ureter just above the ureterovesical junction. Similar size and position of the stone compared to 08/02/2023. Bladder is normal. Stomach/Bowel: Stomach is within normal limits. Appendix appears normal. No evidence of bowel wall thickening, distention, or inflammatory changes. Vascular/Lymphatic: Aortic atherosclerosis. No enlarged abdominal or pelvic lymph nodes. Reproductive: Uterus and bilateral adnexa are unremarkable. Other: No abdominal wall hernia or abnormality. No abdominopelvic  ascites. Musculoskeletal: No acute or significant osseous findings. IMPRESSION: 1. 4 mm stone in the distal right ureter just above the ureterovesical junction with moderate proximal obstruction. This is similar to prior study of 08/02/2023. 2. Additional bilateral nonobstructing intrarenal stones. 3. Aortic atherosclerosis. 4. Small esophageal hiatal hernia. Electronically Signed   By: Elsie Gravely M.D.   On: 11/27/2023 23:57    Microbiology: Results for orders placed or performed during the hospital encounter of 11/27/23  Urine Culture (for pregnant, neutropenic or urologic patients or patients with an indwelling urinary catheter)     Status: Abnormal   Collection Time: 11/27/23 10:02 PM   Specimen: Urine, Clean Catch  Result Value Ref Range Status   Specimen Description   Final    URINE, CLEAN CATCH Performed at [employer]  [facility], [address], [place], [place] [address]    Special Requests   Final    NONE Performed at [facility], [address] [place], [place] [address]    Culture (A)  Final    <10,000 COLONIES/mL INSIGNIFICANT GROWTH Performed at [facility], [address] [place], [place] [address]    Report Status 11/29/2023 FINAL  Final    Labs: CBC: Recent Labs  Lab 12/18/23 2232  WBC 13.1*  NEUTROABS 9.1*  HGB 12.3  HCT 37.7  MCV 92.0  PLT 349   Basic Metabolic Panel: Recent Labs  Lab 12/18/23 2232 12/19/23 0756  NA 138 141  K 3.3* 3.3*  CL 105 110  CO2 22 24  GLUCOSE 121* 97  BUN 21 17  CREATININE 1.63* 1.02*  CALCIUM 9.3 8.4*   Liver Function Tests: Recent Labs  Lab 12/18/23 2232  AST 26  ALT 17  ALKPHOS 70  BILITOT <0.2  PROT 6.6  ALBUMIN 4.0   CBG: Recent Labs  Lab 12/18/23 2250  GLUCAP 122*    Discharge time spent: 32 minutes.  Signed: Maddeline Roorda, DO [facility] 12/20/2023

## 2023-12-20 NOTE — Progress Notes (Signed)
 Patient called from [facility] said scripts were not there yet.  Spoke with MD Dezii she said showing sent and received on her end, patient asked that she resend to pharmacy when she gets a chance, I let MD Dezii know.

## 2023-12-26 ENCOUNTER — Ambulatory Visit: Attending: Family Medicine | Admitting: Occupational Therapy

## 2023-12-26 DIAGNOSIS — R278 Other lack of coordination: Secondary | ICD-10-CM | POA: Insufficient documentation

## 2023-12-26 DIAGNOSIS — H543 Unqualified visual loss, both eyes: Secondary | ICD-10-CM | POA: Insufficient documentation

## 2023-12-26 DIAGNOSIS — H547 Unspecified visual loss: Secondary | ICD-10-CM | POA: Insufficient documentation

## 2023-12-26 DIAGNOSIS — M6281 Muscle weakness (generalized): Secondary | ICD-10-CM | POA: Insufficient documentation

## 2023-12-26 NOTE — Therapy (Unsigned)
 OUTPATIENT OCCUPATIONAL THERAPY NEURO EVALUATION  Patient Name: Ashley Collins MRN: [id number] DOB:17-Sep-1960, 63 y.o., female Today's Date: 12/27/2023  PCP: Harvey Gaetana CROME, NP REFERRING PROVIDER: Harvey Gaetana CROME, NP  END OF SESSION:  OT End of Session - 12/27/23 0840     Visit Number 1    Number of Visits 24    Date for OT Re-Evaluation 03/19/24    OT Start Time 1616    OT Stop Time 1715    OT Time Calculation (min) 59 min    Activity Tolerance Patient tolerated treatment well    Behavior During Therapy WFL for tasks assessed/performed          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   Cardiac arrhythmia    Nonspecific ST T wave changes on EKG   Chronic venous insufficiency of lower extremity    Complication of anesthesia    nausea and vomiting   DDD (degenerative disc disease), lumbosacral    Essential hypertension    Headache    History of kidney stones    Hyperlipidemia    Kidney stones    Lymphedema    Migraines    Osteoporosis    PONV (postoperative nausea and vomiting)    Right ureteral stone    Vitamin B12 deficiency    Vitamin D deficiency    Past Surgical History:  Procedure Laterality Date   AUGMENTATION MAMMAPLASTY     CESAREAN SECTION     x 4   COLONOSCOPY WITH PROPOFOL  N/A 03/31/2022   Procedure: COLONOSCOPY WITH PROPOFOL ;  Surgeon: Onita Elspeth Sharper, DO;  Location: [facility] ENDOSCOPY;  Service: Gastroenterology;  Laterality: N/A;   CYSTOSCOPY W/ RETROGRADES Bilateral 07/10/2020   Procedure: CYSTOSCOPY WITH RETROGRADE PYELOGRAM;  Surgeon: Francisca Redell BROCKS, MD;  Location: [facility] ORS;  Service: Urology;  Laterality: Bilateral;   CYSTOSCOPY/URETEROSCOPY/HOLMIUM LASER/STENT PLACEMENT     CYSTOSCOPY/URETEROSCOPY/HOLMIUM LASER/STENT PLACEMENT Bilateral 07/10/2020   Procedure: CYSTOSCOPY/URETEROSCOPY/HOLMIUM LASER/STENT PLACEMENT;  Surgeon: Francisca Redell BROCKS, MD;  Location: [facility] ORS;   Service: Urology;  Laterality: Bilateral;   CYSTOSCOPY/URETEROSCOPY/HOLMIUM LASER/STENT PLACEMENT Right 08/25/2023   Procedure: CYSTOSCOPY/URETEROSCOPY/HOLMIUM LASER;  Surgeon: Francisca Redell BROCKS, MD;  Location: [facility] ORS;  Service: Urology;  Laterality: Right;   CYSTOSCOPY/URETEROSCOPY/HOLMIUM LASER/STENT PLACEMENT Right 12/15/2023   Procedure: CYSTOSCOPY/URETEROSCOPY/HOLMIUM LASER;  Surgeon: Francisca Redell BROCKS, MD;  Location: [facility] OR[facility];  Service: Urology;  Laterality: Right;   EXTRACORPOREAL SHOCK WAVE LITHOTRIPSY     x 10 plus   Eye Lift     FACIAL COSMETIC SURGERY     GANGLION CYST EXCISION Right 12/21/2021   Procedure: REMOVAL GANGLION OF WRIST;  Surgeon: Kathlynn Sharper, MD;  Location: [facility] ORS;  Service: Orthopedics;  Laterality: Right;   LIPOSUCTION     TONSILLECTOMY     TRIGGER FINGER RELEASE Right 12/21/2021   Procedure: RELEASE TRIGGER FINGER/A-1 PULLEY;  Surgeon: Kathlynn Sharper, MD;  Location: [facility] ORS;  Service: Orthopedics;  Laterality: Right;   URETEROSCOPY WITH HOLMIUM LASER LITHOTRIPSY     Patient Active Problem List   Diagnosis Date Noted   Acute CVA (cerebrovascular accident) (HCC) 12/19/2023   Hypokalemia 12/19/2023   AKI (acute kidney injury) (HCC) 12/19/2023   Leukocytosis 12/19/2023   Hyperlipidemia, unspecified 12/19/2023   Essential hypertension 12/19/2023   Vision changes 12/19/2023   Lymphedema 05/22/2023   Chronic venous insufficiency 05/22/2023   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 replacement therapy 04/10/2023   Insomnia due  to medical condition 04/25/2022   GAD (generalized anxiety disorder) 02/02/2022   Other specified depressive episodes 02/02/2022   High risk medication use 02/02/2022   Migraine with aura and without status migrainosus, not intractable 11/03/2020   Arrhythmia 08/06/2020   Status migrainosus 02/25/2019   Osteoporosis 04/09/2013   Vitamin D deficiency 04/09/2013   DDD (degenerative disc disease), lumbosacral 02/08/2005    ONSET DATE:  12/18/2023  REFERRING DIAG:   THERAPY DIAG:  Visual impairment  Other lack of coordination  Muscle weakness (generalized)  Rationale for Evaluation and Treatment: Rehabilitation  SUBJECTIVE:   SUBJECTIVE STATEMENT:  Pt. Reports 2/10 headache pain. Pt accompanied by: significant other  PERTINENT HISTORY: Pt. Has Hx of stroke with onset 12/18/2023. Pt. PMHx includes: HTN, Hyperlipidemia, Kidney Stones, near syncopal event, loss of vision.  PRECAUTIONS: None  WEIGHT BEARING RESTRICTIONS:  PAIN:  Are you having pain? Yes, low headache 1-2/10 pain.  FALLS: Has patient fallen in last 6 months? Yes. Number of falls    LIVING ENVIRONMENT: Lives with: lives with their family Lives in: [place], [place] Stairs: No, inside the house, yes but does not use Has following equipment at home:   PLOF: Independent  PATIENT GOALS: To be able to see  OBJECTIVE:  Note: Objective measures were completed at Evaluation unless otherwise noted.  HAND DOMINANCE: Right  ADLs:  Eating: Drinking from straw is different, able to use utensils.  Grooming: Fatigues completing hair care.  UB Dressing: independent, if clothes are in front of her she can find clothing LB Dressing: Independent Toileting: Independent Bathing: Independent Tub Shower transfers: Independent Equipment: none Has difficulty with using cell phone  IADLs: Shopping: Does not typically go out shopping, and has not tried to. Light housekeeping: Pt. reports that she does not typically do house cleaning. Pt. Has been able to unpack belongings from boxes due to her recent move in. Meal Prep: Pt. reports that is does not currently cook, however reports that she probably could. Has difficulty opening bottle caps/lids. Community mobility: Requires assistance from husband to navigate  through buildings, negotiate stairs-Pt. With increased fear of falling  Medication management: Able to push down medication bottles to open  Financial  management: No change in the process-uses automatic bill pay system.  Has difficulty with using cell phone Handwriting: N/T Work: Pt. was actively working managing a Health visitor, works a lot of hours  MOBILITY STATUS: Independent and Needs Assist: Requires hand on hand assistance with nvigating through environments.   POSTURE COMMENTS:  No Significant postural limitations Sitting balance: Good  ACTIVITY TOLERANCE: Activity tolerance: Good  FUNCTIONAL OUTCOME MEASURES:   UPPER EXTREMITY ROM:    Active ROM Right Eval WFL Left Eval [facility]  Shoulder flexion    Shoulder abduction    Shoulder adduction    Shoulder extension    Shoulder internal rotation    Shoulder external rotation    Elbow flexion    Elbow extension    Wrist flexion    Wrist extension    Wrist ulnar deviation    Wrist radial deviation    Wrist pronation    Wrist supination    (Blank rows = not tested)  UPPER EXTREMITY MMT:     MMT Right eval Left eval  Shoulder flexion 4-/5 4-/5  Shoulder abduction 4-/5 4-/5  Shoulder adduction    Shoulder extension    Shoulder internal rotation    Shoulder external rotation    Middle trapezius    Lower trapezius  Elbow flexion 5/5 5/5  Elbow extension 5/5 5/5  Wrist flexion 4/5 4-/5  Wrist extension 4/5 4-/5  Wrist ulnar deviation    Wrist radial deviation    Wrist pronation    Wrist supination    (Blank rows = not tested)  HAND FUNCTION: Grip strength: Right: 39 lbs; Left: 28 lbs, Lateral pinch: Right: 11 lbs, Left: 9 lbs, and 3 point pinch: Right: 9 lbs, Left: 9 lbs  COORDINATION: 9 Hole Peg test: Right: 30 sec; Left: 36 sec  SENSATION: WFL  EDEMA:   MUSCLE TONE:   COGNITION: Overall cognitive status: Within functional limits for tasks assessed  VISION: Subjective report: Pt. Reports changes in her vision at onset of stroke, starting with blurry vision, resulting in vision loss in the R eye.  Baseline vision:   Visual history: Hx  of eye surgery  VISION ASSESSMENT: TBD     PERCEPTION: WFL  PRAXIS: WFL  OBSERVATIONS:                                                                                                                              TREATMENT DATE: 12/26/2023   Evaluation completed and education provided as indicated below.   PATIENT EDUCATION: Education details: POC, OT services, visual compensatory strategies, ADLs/IADLs Person educated: Patient and Spouse Education method: Explanation and Verbal cues Education comprehension: verbalized understanding and returned demonstration  HOME EXERCISE PROGRAM:    GOALS: Goals reviewed with patient? Yes  SHORT TERM GOALS: Target date: 02/06/2024    Pt. Will independently utilize HEP for hand strength, coordination, and visual compensatory strategies ADL/ADLs Baseline: Eval: No  current HEP  Goal status: INITIAL    LONG TERM GOALS: Target date: 03/19/2024    Pt. Will be able to independently implement visual scanning/visual search compensatory strategies for tasks within her extra personal space navigating through community environments 100% of the time.  Baseline: Eval: Pt. Requires increased assist to navigate through community environments. Goal status: INITIAL  2.  Pt. Will be able to independently utilize visual scanning/visual search strategies  during ADLS/IADL within her near space, and during tabletop top tasks. 100% of the time.  Baseline: Eval: Pt. Education to be provided.  Goal status: INITIAL  3.  Pt. Will be able to independently initiate visual scanning techniques in her own environment to reduce risk of falls.  Baseline: Eval: Pt. Education to be provided.  Goal status: INITIAL  4.  Pt. Will increase BUE Grip Strength by 5# to be able to independently hold objects for ADL/IADL use. Baseline: Eval: Right Grip: 39#, Left Grip: 28# Goal status: INITIAL  5.  Pt. Will increase L Lateral Pinch strength by 2# to be able to  independently twist off bottle caps/lids. Baseline:  Goal status: INITIAL  6.  Pt. Will increase BUE strength for shoulder flexion/abduction by 2 mm grades to independently complete hair care tasks. Baseline: Eval: Right Shoulder Flexion: 4-/5, Left Shoulder Flexion:4-/5, Right Shoulder Abduction:  4-/5, Left Shoulder Abduction: 4-/5 Goal status: INITIAL  ASSESSMENT:  CLINICAL IMPRESSION:  Patient is a 63 y.o. female who was seen today for occupational therapy evaluation for A CVA. Pt. presents with right sided visual impairments from the CVA, as well as a history of left sided visual impairments, limited  BUE strength, Grip strength, limited Left Lateral and 3 pt pinch strength, and limited [facility] skills which hinder her ability to navigate through her environment, dial cell phone numbers, navigate through new places, perform grooming and hair care tasks, perform self-feeding tasks, and efficiently pick out clothing, and perform work related tasks. Pt. will benefit from Occupational Therapy services to improve her ability to use visual compensatory strategies, and improve overall UE functioning in order to improve engagement in, and maximize overall independence with ADL, and IADL tasks.  PERFORMANCE DEFICITS: in functional skills including ADLs, IADLs, coordination, dexterity, Fine motor control, and vision, and psychosocial skills including coping strategies, environmental adaptation, habits, interpersonal interactions, and routines and behaviors.   IMPAIRMENTS: are limiting patient from ADLs, IADLs, rest and sleep, work, leisure, and social participation.   CO-MORBIDITIES: may have co-morbidities  that affects occupational performance. Patient will benefit from skilled OT to address above impairments and improve overall function.  MODIFICATION OR ASSISTANCE TO COMPLETE EVALUATION: Min-Moderate modification of tasks or assist with assess necessary to complete an evaluation.  OT OCCUPATIONAL  PROFILE AND HISTORY: Detailed assessment: Review of records and additional review of physical, cognitive, psychosocial history related to current functional performance.  CLINICAL DECISION MAKING: Moderate - several treatment options, min-mod task modification necessary  REHAB POTENTIAL: Good  EVALUATION COMPLEXITY: Moderate    PLAN:  OT FREQUENCY: 2x/week  OT DURATION: 12 weeks  PLANNED INTERVENTIONS: 97168 OT Re-evaluation, 97535 self care/ADL training, 02889 therapeutic exercise, 97530 therapeutic activity, 97112 neuromuscular re-education, visual/perceptual remediation/compensation, patient/family education, and DME and/or AE instructions  RECOMMENDED OTHER SERVICES: ST  CONSULTED AND AGREED WITH PLAN OF CARE: Patient and family member/caregiver  PLAN FOR NEXT SESSION: Treatment   Damien Nap, Student-OT  This entire session was performed under direct supervision and direction of a licensed therapist/therapist assistant . I have personally read, edited and approve of the note as written.  Richardson Otter, MS, OTR/L   .12/27/2023, 8:45 AM

## 2023-12-27 ENCOUNTER — Telehealth: Payer: Self-pay | Admitting: Psychiatry

## 2023-12-27 ENCOUNTER — Encounter

## 2023-12-27 NOTE — Telephone Encounter (Signed)
 Called patient to get more information about her anxiety she did not answer and unable to leave a voicemail due to mailbox full

## 2023-12-27 NOTE — Telephone Encounter (Signed)
 Please try again later or tomorrow. I do not recommend increasing Lorazepam  dose. Could increase Lamotrigine  or consider other medications , however she will need an appointment. Also please remind her to establish care with a therapist for psychotherapy.

## 2023-12-27 NOTE — Telephone Encounter (Signed)
 Called patient she stated that she had a stroke and also kidney stone surgery and her anxiety is out the roof she was very upset on the phone crying after informing her of the message from the provider saying that if you do not want to treat her then say so I explained to the patient that its not that the provider does not want to treat her she was not recommending increasing the Lorazepam  she stated that she has lost some of her eyesight due to the stroke and that she needs to be on medication that is compatible with the blood thinner and cholesterol medications that she is currently on she has not established care with a therapist she did state that she is doing group therapy she did not mention who or where she is doing the group therapy. I told the patient that I would have the front desk to call her to see if there is a sooner appointment for her.

## 2023-12-28 NOTE — Telephone Encounter (Signed)
 Noted , patient will need a 30 minutes follow up appointment. Please keep on a list to call.

## 2023-12-28 NOTE — Telephone Encounter (Signed)
 Anything available is fine, 30 minutes.

## 2023-12-31 ENCOUNTER — Other Ambulatory Visit: Payer: Self-pay

## 2023-12-31 DIAGNOSIS — H547 Unspecified visual loss: Secondary | ICD-10-CM | POA: Diagnosis not present

## 2023-12-31 DIAGNOSIS — I10 Essential (primary) hypertension: Secondary | ICD-10-CM | POA: Diagnosis present

## 2023-12-31 DIAGNOSIS — Z5321 Procedure and treatment not carried out due to patient leaving prior to being seen by health care provider: Secondary | ICD-10-CM | POA: Insufficient documentation

## 2023-12-31 DIAGNOSIS — Z8673 Personal history of transient ischemic attack (TIA), and cerebral infarction without residual deficits: Secondary | ICD-10-CM | POA: Insufficient documentation

## 2023-12-31 LAB — CBC WITH DIFFERENTIAL/PLATELET
Abs Immature Granulocytes: 0.04 K/uL (ref 0.00–0.07)
Basophils Absolute: 0.1 K/uL (ref 0.0–0.1)
Basophils Relative: 1 %
Eosinophils Absolute: 0.3 K/uL (ref 0.0–0.5)
Eosinophils Relative: 3 %
HCT: 36.2 % (ref 36.0–46.0)
Hemoglobin: 12 g/dL (ref 12.0–15.0)
Immature Granulocytes: 0 %
Lymphocytes Relative: 21 %
Lymphs Abs: 2.4 K/uL (ref 0.7–4.0)
MCH: 30.4 pg (ref 26.0–34.0)
MCHC: 33.1 g/dL (ref 30.0–36.0)
MCV: 91.6 fL (ref 80.0–100.0)
Monocytes Absolute: 0.9 K/uL (ref 0.1–1.0)
Monocytes Relative: 8 %
Neutro Abs: 7.3 K/uL (ref 1.7–7.7)
Neutrophils Relative %: 67 %
Platelets: 334 K/uL (ref 150–400)
RBC: 3.95 MIL/uL (ref 3.87–5.11)
RDW: 12.7 % (ref 11.5–15.5)
WBC: 11 K/uL — ABNORMAL HIGH (ref 4.0–10.5)
nRBC: 0 % (ref 0.0–0.2)

## 2023-12-31 NOTE — ED Triage Notes (Signed)
 Pt states that she had a stroke on 6/23 and since then she has been keeping up with her BP since and has noticed that her BP has been high. Pt did have recent change of antihypertensive medications. Pt is hypertensive during triage. She reports just not feeling well and needing assistance with lowering her BP. Denies CP. Denies SOB

## 2024-01-01 ENCOUNTER — Emergency Department
Admission: EM | Admit: 2024-01-01 | Discharge: 2024-01-01 | Discharge status: Against Medical Advice | Attending: Emergency Medicine | Admitting: Emergency Medicine

## 2024-01-01 ENCOUNTER — Ambulatory Visit: Admitting: Occupational Therapy

## 2024-01-01 DIAGNOSIS — H547 Unspecified visual loss: Secondary | ICD-10-CM | POA: Diagnosis not present

## 2024-01-01 DIAGNOSIS — M6281 Muscle weakness (generalized): Secondary | ICD-10-CM

## 2024-01-01 LAB — BASIC METABOLIC PANEL WITH GFR
Anion gap: 9 (ref 5–15)
BUN: 15 mg/dL (ref 8–23)
CO2: 23 mmol/L (ref 22–32)
Calcium: 9.7 mg/dL (ref 8.9–10.3)
Chloride: 106 mmol/L (ref 98–111)
Creatinine, Ser: 0.78 mg/dL (ref 0.44–1.00)
GFR, Estimated: >60 mL/min (ref >60)
Glucose, Bld: 98 mg/dL (ref 70–99)
Potassium: 3.6 mmol/L (ref 3.5–5.1)
Sodium: 138 mmol/L (ref 135–145)

## 2024-01-01 LAB — TROPONIN I (HIGH SENSITIVITY): Troponin I (High Sensitivity): 5 ng/L (ref <18)

## 2024-01-01 MED ORDER — LISINOPRIL 10 MG PO TABS
10.0000 mg | ORAL_TABLET | Freq: Once | ORAL | Status: AC
  Administered 2024-01-01: 10 mg via ORAL
  Filled 2024-01-01: qty 1

## 2024-01-01 NOTE — Therapy (Signed)
 OUTPATIENT OCCUPATIONAL THERAPY NEURO TREATMENT  NOTE  Patient Name: Ashley Collins MRN: [id number] DOB:Sep 02, 1960, 63 y.o., female Today's Date: 01/01/2024  PCP: Harvey Gaetana CROME, NP REFERRING PROVIDER: Harvey Gaetana CROME, NP   OT End of Session - 01/01/24 1324     Visit Number 2    Number of Visits 24    Date for OT Re-Evaluation 03/20/24    OT Start Time 1100    OT Stop Time 1145    OT Time Calculation (min) 45 min    Activity Tolerance Patient tolerated treatment well    Behavior During Therapy WFL for tasks assessed/performed          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   Cardiac arrhythmia    Nonspecific ST T wave changes on EKG   Chronic venous insufficiency of lower extremity    Complication of anesthesia    nausea and vomiting   DDD (degenerative disc disease), lumbosacral    Essential hypertension    Headache    History of kidney stones    Hyperlipidemia    Kidney stones    Lymphedema    Migraines    Osteoporosis    PONV (postoperative nausea and vomiting)    Right ureteral stone    Vitamin B12 deficiency    Vitamin D deficiency    Past Surgical History:  Procedure Laterality Date   AUGMENTATION MAMMAPLASTY     CESAREAN SECTION     x 4   COLONOSCOPY WITH PROPOFOL  N/A 03/31/2022   Procedure: COLONOSCOPY WITH PROPOFOL ;  Surgeon: Onita Elspeth Sharper, DO;  Location: [facility];  Service: Gastroenterology;  Laterality: N/A;   CYSTOSCOPY W/ RETROGRADES Bilateral 07/10/2020   Procedure: CYSTOSCOPY WITH RETROGRADE PYELOGRAM;  Surgeon: Francisca Redell BROCKS, MD;  Location: [facility];  Service: Urology;  Laterality: Bilateral;   CYSTOSCOPY/URETEROSCOPY/HOLMIUM LASER/STENT PLACEMENT     CYSTOSCOPY/URETEROSCOPY/HOLMIUM LASER/STENT PLACEMENT Bilateral 07/10/2020   Procedure: CYSTOSCOPY/URETEROSCOPY/HOLMIUM LASER/STENT PLACEMENT;  Surgeon: Francisca Redell BROCKS, MD;  Location: [facility];  Service:  Urology;  Laterality: Bilateral;   CYSTOSCOPY/URETEROSCOPY/HOLMIUM LASER/STENT PLACEMENT Right 08/25/2023   Procedure: CYSTOSCOPY/URETEROSCOPY/HOLMIUM LASER;  Surgeon: Francisca Redell BROCKS, MD;  Location: [facility];  Service: Urology;  Laterality: Right;   CYSTOSCOPY/URETEROSCOPY/HOLMIUM LASER/STENT PLACEMENT Right 12/15/2023   Procedure: CYSTOSCOPY/URETEROSCOPY/HOLMIUM LASER;  Surgeon: Francisca Redell BROCKS, MD;  Location: [facility];  Service: Urology;  Laterality: Right;   EXTRACORPOREAL SHOCK WAVE LITHOTRIPSY     x 10 plus   Eye Lift     FACIAL COSMETIC SURGERY     GANGLION CYST EXCISION Right 12/21/2021   Procedure: REMOVAL GANGLION OF WRIST;  Surgeon: Kathlynn Sharper, MD;  Location: [facility];  Service: Orthopedics;  Laterality: Right;   LIPOSUCTION     TONSILLECTOMY     TRIGGER FINGER RELEASE Right 12/21/2021   Procedure: RELEASE TRIGGER FINGER/A-1 PULLEY;  Surgeon: Kathlynn Sharper, MD;  Location: [facility];  Service: Orthopedics;  Laterality: Right;   URETEROSCOPY WITH HOLMIUM LASER LITHOTRIPSY     Patient Active Problem List   Diagnosis Date Noted   Acute CVA (cerebrovascular accident) (HCC) 12/19/2023   Hypokalemia 12/19/2023   AKI (acute kidney injury) (HCC) 12/19/2023   Leukocytosis 12/19/2023   Hyperlipidemia, unspecified 12/19/2023   Essential hypertension 12/19/2023   Vision changes 12/19/2023   Lymphedema 05/22/2023   Chronic venous insufficiency 05/22/2023   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 replacement therapy 04/10/2023   Insomnia due to  medical condition 04/25/2022   GAD (generalized anxiety disorder) 02/02/2022   Other specified depressive episodes 02/02/2022   High risk medication use 02/02/2022   Migraine with aura and without status migrainosus, not intractable 11/03/2020   Arrhythmia 08/06/2020   Status migrainosus 02/25/2019   Osteoporosis 04/09/2013   Vitamin D deficiency 04/09/2013   DDD (degenerative disc disease), lumbosacral 02/08/2005    ONSET DATE:  12/18/2023  REFERRING DIAG:   THERAPY DIAG:  Muscle weakness (generalized)  Rationale for Evaluation and Treatment: Rehabilitation  SUBJECTIVE:   SUBJECTIVE STATEMENT:  Pt. reports having had an ER visit last night due to high blood pressure. Pt accompanied by: significant other  PERTINENT HISTORY: Pt. Has Hx of stroke with onset 12/18/2023. Pt. PMHx includes: HTN, Hyperlipidemia, Kidney Stones, near syncopal event, loss of vision.  PRECAUTIONS: None  WEIGHT BEARING RESTRICTIONS:  PAIN:  Are you having pain? Yes, low headache 1-2/10 pain.  FALLS: Has patient fallen in last 6 months? Yes. Number of falls    LIVING ENVIRONMENT: Lives with: lives with their family Lives in: [place], [place] Stairs: No, inside the house, yes but does not use Has following equipment at home:   PLOF: Independent  PATIENT GOALS: To be able to see  OBJECTIVE:  Note: Objective measures were completed at Evaluation unless otherwise noted.  HAND DOMINANCE: Right  ADLs:  Eating: Drinking from straw is different, able to use utensils.  Grooming: Fatigues completing hair care.  UB Dressing: independent, if clothes are in front of her she can find clothing LB Dressing: Independent Toileting: Independent Bathing: Independent Tub Shower transfers: Independent Equipment: none Has difficulty with using cell phone  IADLs: Shopping: Does not typically go out shopping, and has not tried to. Light housekeeping: Pt. reports that she does not typically do house cleaning. Pt. Has been able to unpack belongings from boxes due to her recent move in. Meal Prep: Pt. reports that is does not currently cook, however reports that she probably could. Has difficulty opening bottle caps/lids. Community mobility: Requires assistance from husband to navigate  through buildings, negotiate stairs-Pt. With increased fear of falling  Medication management: Able to push down medication bottles to open  Financial  management: No change in the process-uses automatic bill pay system.  Has difficulty with using cell phone Handwriting: N/T Work: Pt. was actively working managing a Health visitor, works a lot of hours  MOBILITY STATUS: Independent and Needs Assist: Requires hand on hand assistance with nvigating through environments.   POSTURE COMMENTS:  No Significant postural limitations Sitting balance: Good  ACTIVITY TOLERANCE: Activity tolerance: Good  FUNCTIONAL OUTCOME MEASURES:   UPPER EXTREMITY ROM:    Active ROM Right Eval WFL Left Eval [facility]  Shoulder flexion    Shoulder abduction    Shoulder adduction    Shoulder extension    Shoulder internal rotation    Shoulder external rotation    Elbow flexion    Elbow extension    Wrist flexion    Wrist extension    Wrist ulnar deviation    Wrist radial deviation    Wrist pronation    Wrist supination    (Blank rows = not tested)  UPPER EXTREMITY MMT:     MMT Right eval Left eval  Shoulder flexion 4-/5 4-/5  Shoulder abduction 4-/5 4-/5  Shoulder adduction    Shoulder extension    Shoulder internal rotation    Shoulder external rotation    Middle trapezius    Lower trapezius  Elbow flexion 5/5 5/5  Elbow extension 5/5 5/5  Wrist flexion 4/5 4-/5  Wrist extension 4/5 4-/5  Wrist ulnar deviation    Wrist radial deviation    Wrist pronation    Wrist supination    (Blank rows = not tested)  HAND FUNCTION: Grip strength: Right: 39 lbs; Left: 28 lbs, Lateral pinch: Right: 11 lbs, Left: 9 lbs, and 3 point pinch: Right: 9 lbs, Left: 9 lbs  COORDINATION: 9 Hole Peg test: Right: 30 sec; Left: 36 sec  SENSATION: WFL  EDEMA:   MUSCLE TONE:   COGNITION: Overall cognitive status: Within functional limits for tasks assessed  VISION: Subjective report: Pt. Reports changes in her vision at onset of stroke, starting with blurry vision, resulting in vision loss in the R eye.  Baseline vision: Pt. Is able to  visually track  Visual history: Hx of eye surgery  VISION ASSESSMENT: TBD     PERCEPTION: WFL  PRAXIS: WFL  OBSERVATIONS:                                                                                                                              TREATMENT DATE: 01/01/2024   Therapeutic Activities:  The [employer] Visual Attention Assessment were administered to determine how his visual impairments may be affecting his ADL, and IADL functioning. SEARCH STRATEGIES FOR NEAR SPACE for tabletop tasks Structured visual Array Single letter search-simple accurate responses: 38  percentage correct: 95%  Time: 2 min. & 39 sec. Omissions:1, Misidentifications: 1 with initiating immediate correction search strategy used: symmetrical horizontal  left to right Single letter search-crowded Accurate responses: 39 percentage correct: 98% Time: 2 min. 39 sec. Omissions: 1  Search strategy used: symmetrical horizontal left to right.  Word Search Accrate Responses: 29 Percent correct: 97% Time:  3 min.&5 sec.Omissions: 1 Search Strategy used: symmetrical horizontal left to right  Structured complex circles search Accurate responses: 30 Percent correct: 100% Time: 1 min. & 13 sec. Omissions: 0 Search Strategy used: symmetrical horizontal left to right  Unstructured Visual Array Random Plain Circles-Simple Accurate Responses: 20 percent correct: 100%  Time: 29 sec. Omissions: 0 Search strategy used: symmetrical horizontal left to right Random Plain Circles-Crowded: Accurate Responses: 40 Percent correct: 100%  Time: 1 min. & 3 sec. Omissions: 0 Search Strategy used: symmetrical horizontal left to right Random Complex Circles Search Accurate responses:  40  Percent correct: 100%  Time: 2 min. & 3 sec.   Omissions: 0, misidentifications: 1 Search strategy used: both symmetrical horizontal rectilinear  SEARCH STRATEGIES FOR EXTRAPERSONAL SPACE Scanboard: Utilized horizontal visual scanning, and  visual search strategies.  -Pt. education was provided about visual compensatory strategies for table top tasks and determining the need to use an anchor. Pt. is currently using her left 2nd digit as a guide when scanning during reading. Pt. reports concern about needing to be able to read receipts efficiently at work. Reviewed compensatory strategies for receipts.  PATIENT EDUCATION: Education  details:visual compensatory strategies, ADLs/IADLs Person educated: Patient and Spouse Education method: Explanation and Verbal cues Education comprehension: verbalized understanding and returned demonstration  HOME EXERCISE PROGRAM:  - large print word searches  GOALS: Goals reviewed with patient? Yes  SHORT TERM GOALS: Target date: 02/06/2024    Pt. Will independently utilize HEP for hand strength, coordination, and visual compensatory strategies ADL/ADLs Baseline: Eval: No  current HEP  Goal status: INITIAL    LONG TERM GOALS: Target date: 03/19/2024    Pt. Will be able to independently implement visual scanning/visual search compensatory strategies for tasks within her extra personal space navigating through community environments 100% of the time.  Baseline: Eval: Pt. Requires increased assist to navigate through community environments. Goal status: INITIAL  2.  Pt. Will be able to independently utilize visual scanning/visual search strategies  during ADLS/IADL within her near space, and during tabletop top tasks. 100% of the time.  Baseline: Eval: Pt. Education to be provided.  Goal status: INITIAL  3.  Pt. Will be able to independently initiate visual scanning techniques in her own environment to reduce risk of falls.  Baseline: Eval: Pt. Education to be provided.  Goal status: INITIAL  4.  Pt. Will increase BUE Grip Strength by 5# to be able to independently hold objects for ADL/IADL use. Baseline: Eval: Right Grip: 39#, Left Grip: 28# Goal status: INITIAL  5.  Pt. Will  increase L Lateral Pinch strength by 2# to be able to independently twist off bottle caps/lids. Baseline:  Goal status: INITIAL  6.  Pt. Will increase BUE strength for shoulder flexion/abduction by 2 mm grades to independently complete hair care tasks. Baseline: Eval: Right Shoulder Flexion: 4-/5, Left Shoulder Flexion:4-/5, Right Shoulder Abduction: 4-/5, Left Shoulder Abduction: 4-/5 Goal status: INITIAL  ASSESSMENT:  CLINICAL IMPRESSION:  Pt. reports having had an ER visit last night, and was there until 3 a.m. 2/2 high blood pressure. Pt. reports that her BP medication was adjusted. BP upon arrival to therapy this morning was 129/90, with the HR 70 bpms. Pt. presents with increased difficulty when attempting to perform visual scanning for 2 items on the same page requiring increased time to complete. Pt. consistently omitted one item in the center of the page when visually scanning for 2 different letter, or word items on each page. Pt. reports that it's hard for her to remember to scan for 2 items instead of 1. Pt. was able to identify 100% of the items without omitting any when visually scanning for 1 item on each page.  Pt. continues benefit from Occupational Therapy services to improve her ability to use visual compensatory strategies, and improve overall BUE functioning in order to improve engagement in, and maximize overall independence with ADL, and IADL tasks.  PERFORMANCE DEFICITS: in functional skills including ADLs, IADLs, coordination, dexterity, Fine motor control, and vision, and psychosocial skills including coping strategies, environmental adaptation, habits, interpersonal interactions, and routines and behaviors.   IMPAIRMENTS: are limiting patient from ADLs, IADLs, rest and sleep, work, leisure, and social participation.   CO-MORBIDITIES: may have co-morbidities  that affects occupational performance. Patient will benefit from skilled OT to address above impairments and  improve overall function.  MODIFICATION OR ASSISTANCE TO COMPLETE EVALUATION: Min-Moderate modification of tasks or assist with assess necessary to complete an evaluation.  OT OCCUPATIONAL PROFILE AND HISTORY: Detailed assessment: Review of records and additional review of physical, cognitive, psychosocial history related to current functional performance.  CLINICAL DECISION MAKING: Moderate - several treatment options, min-mod  task modification necessary  REHAB POTENTIAL: Good  EVALUATION COMPLEXITY: Moderate    PLAN:  OT FREQUENCY: 2x/week  OT DURATION: 12 weeks  PLANNED INTERVENTIONS: 97168 OT Re-evaluation, 97535 self care/ADL training, 02889 therapeutic exercise, 97530 therapeutic activity, 97112 neuromuscular re-education, visual/perceptual remediation/compensation, patient/family education, and DME and/or AE instructions  RECOMMENDED OTHER SERVICES: ST  CONSULTED AND AGREED WITH PLAN OF CARE: Patient and family member/caregiver  PLAN FOR NEXT SESSION: Treatment  Richardson Otter, MS, OTR/L   01/01/2024, 1:28 PM

## 2024-01-03 ENCOUNTER — Ambulatory Visit: Admitting: Occupational Therapy

## 2024-01-03 DIAGNOSIS — H547 Unspecified visual loss: Secondary | ICD-10-CM | POA: Diagnosis not present

## 2024-01-03 DIAGNOSIS — H543 Unqualified visual loss, both eyes: Secondary | ICD-10-CM

## 2024-01-03 NOTE — Therapy (Signed)
 OUTPATIENT OCCUPATIONAL THERAPY NEURO TREATMENT  NOTE  Patient Name: Ashley Collins MRN: [id number] DOB:Nov 03, 1960, 63 y.o., female Today's Date: 01/03/2024  PCP: Harvey Gaetana CROME, NP REFERRING PROVIDER: Harvey Gaetana CROME, NP   OT End of Session - 01/03/24 1739     Visit Number 3    Number of Visits 24    Date for OT Re-Evaluation 03/20/24    OT Start Time 1445    OT Stop Time 1530    OT Time Calculation (min) 45 min    Activity Tolerance Patient tolerated treatment well    Behavior During Therapy WFL for tasks assessed/performed          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   Cardiac arrhythmia    Nonspecific ST T wave changes on EKG   Chronic venous insufficiency of lower extremity    Complication of anesthesia    nausea and vomiting   DDD (degenerative disc disease), lumbosacral    Essential hypertension    Headache    History of kidney stones    Hyperlipidemia    Kidney stones    Lymphedema    Migraines    Osteoporosis    PONV (postoperative nausea and vomiting)    Right ureteral stone    Vitamin B12 deficiency    Vitamin D deficiency    Past Surgical History:  Procedure Laterality Date   AUGMENTATION MAMMAPLASTY     CESAREAN SECTION     x 4   COLONOSCOPY WITH PROPOFOL  N/A 03/31/2022   Procedure: COLONOSCOPY WITH PROPOFOL ;  Surgeon: Onita Elspeth Sharper, DO;  Location: [facility] ENDOSCOPY;  Service: Gastroenterology;  Laterality: N/A;   CYSTOSCOPY W/ RETROGRADES Bilateral 07/10/2020   Procedure: CYSTOSCOPY WITH RETROGRADE PYELOGRAM;  Surgeon: Francisca Redell BROCKS, MD;  Location: [facility] ORS;  Service: Urology;  Laterality: Bilateral;   CYSTOSCOPY/URETEROSCOPY/HOLMIUM LASER/STENT PLACEMENT     CYSTOSCOPY/URETEROSCOPY/HOLMIUM LASER/STENT PLACEMENT Bilateral 07/10/2020   Procedure: CYSTOSCOPY/URETEROSCOPY/HOLMIUM LASER/STENT PLACEMENT;  Surgeon: Francisca Redell BROCKS, MD;  Location: [facility] ORS;  Service:  Urology;  Laterality: Bilateral;   CYSTOSCOPY/URETEROSCOPY/HOLMIUM LASER/STENT PLACEMENT Right 08/25/2023   Procedure: CYSTOSCOPY/URETEROSCOPY/HOLMIUM LASER;  Surgeon: Francisca Redell BROCKS, MD;  Location: [facility] ORS;  Service: Urology;  Laterality: Right;   CYSTOSCOPY/URETEROSCOPY/HOLMIUM LASER/STENT PLACEMENT Right 12/15/2023   Procedure: CYSTOSCOPY/URETEROSCOPY/HOLMIUM LASER;  Surgeon: Francisca Redell BROCKS, MD;  Location: [facility] ORS;  Service: Urology;  Laterality: Right;   EXTRACORPOREAL SHOCK WAVE LITHOTRIPSY     x 10 plus   Eye Lift     FACIAL COSMETIC SURGERY     GANGLION CYST EXCISION Right 12/21/2021   Procedure: REMOVAL GANGLION OF WRIST;  Surgeon: Kathlynn Sharper, MD;  Location: [facility] ORS;  Service: Orthopedics;  Laterality: Right;   LIPOSUCTION     TONSILLECTOMY     TRIGGER FINGER RELEASE Right 12/21/2021   Procedure: RELEASE TRIGGER FINGER/A-1 PULLEY;  Surgeon: Kathlynn Sharper, MD;  Location: [facility] ORS;  Service: Orthopedics;  Laterality: Right;   URETEROSCOPY WITH HOLMIUM LASER LITHOTRIPSY     Patient Active Problem List   Diagnosis Date Noted   Acute CVA (cerebrovascular accident) (HCC) 12/19/2023   Hypokalemia 12/19/2023   AKI (acute kidney injury) (HCC) 12/19/2023   Leukocytosis 12/19/2023   Hyperlipidemia, unspecified 12/19/2023   Essential hypertension 12/19/2023   Vision changes 12/19/2023   Lymphedema 05/22/2023   Chronic venous insufficiency 05/22/2023   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 replacement therapy 04/10/2023   Insomnia due to  medical condition 04/25/2022   GAD (generalized anxiety disorder) 02/02/2022   Other specified depressive episodes 02/02/2022   High risk medication use 02/02/2022   Migraine with aura and without status migrainosus, not intractable 11/03/2020   Arrhythmia 08/06/2020   Status migrainosus 02/25/2019   Osteoporosis 04/09/2013   Vitamin D deficiency 04/09/2013   DDD (degenerative disc disease), lumbosacral 02/08/2005    ONSET DATE:  12/18/2023  REFERRING DIAG:   THERAPY DIAG:  Low vision, both eyes  Rationale for Evaluation and Treatment: Rehabilitation  SUBJECTIVE:   SUBJECTIVE STATEMENT:  Pt. reports having had a a BP medication change. Pt accompanied by: significant other  PERTINENT HISTORY: Pt. Has Hx of stroke with onset 12/18/2023. Pt. PMHx includes: HTN, Hyperlipidemia, Kidney Stones, near syncopal event, loss of vision.  PRECAUTIONS: None  WEIGHT BEARING RESTRICTIONS:  PAIN:  Are you having pain? No  FALLS: Has patient fallen in last 6 months? Yes. Number of falls    LIVING ENVIRONMENT: Lives with: lives with their family Lives in: [place], [place] Stairs: No, inside the house, yes but does not use Has following equipment at home:   PLOF: Independent  PATIENT GOALS: To be able to see  OBJECTIVE:  Note: Objective measures were completed at Evaluation unless otherwise noted.  HAND DOMINANCE: Right  ADLs:  Eating: Drinking from straw is different, able to use utensils.  Grooming: Fatigues completing hair care.  UB Dressing: independent, if clothes are in front of her she can find clothing LB Dressing: Independent Toileting: Independent Bathing: Independent Tub Shower transfers: Independent Equipment: none Has difficulty with using cell phone  IADLs: Shopping: Does not typically go out shopping, and has not tried to. Light housekeeping: Pt. reports that she does not typically do house cleaning. Pt. Has been able to unpack belongings from boxes due to her recent move in. Meal Prep: Pt. reports that is does not currently cook, however reports that she probably could. Has difficulty opening bottle caps/lids. Community mobility: Requires assistance from husband to navigate  through buildings, negotiate stairs-Pt. With increased fear of falling  Medication management: Able to push down medication bottles to open  Financial management: No change in the process-uses automatic bill pay system.   Has difficulty with using cell phone Handwriting: N/T Work: Pt. was actively working managing a Health visitor, works a lot of hours  MOBILITY STATUS: Independent and Needs Assist: Requires hand on hand assistance with nvigating through environments.   POSTURE COMMENTS:  No Significant postural limitations Sitting balance: Good  ACTIVITY TOLERANCE: Activity tolerance: Good  FUNCTIONAL OUTCOME MEASURES:   UPPER EXTREMITY ROM:    Active ROM Right Eval WFL Left Eval [facility]  Shoulder flexion    Shoulder abduction    Shoulder adduction    Shoulder extension    Shoulder internal rotation    Shoulder external rotation    Elbow flexion    Elbow extension    Wrist flexion    Wrist extension    Wrist ulnar deviation    Wrist radial deviation    Wrist pronation    Wrist supination    (Blank rows = not tested)  UPPER EXTREMITY MMT:     MMT Right eval Left eval  Shoulder flexion 4-/5 4-/5  Shoulder abduction 4-/5 4-/5  Shoulder adduction    Shoulder extension    Shoulder internal rotation    Shoulder external rotation    Middle trapezius    Lower trapezius    Elbow flexion 5/5 5/5  Elbow extension 5/5  5/5  Wrist flexion 4/5 4-/5  Wrist extension 4/5 4-/5  Wrist ulnar deviation    Wrist radial deviation    Wrist pronation    Wrist supination    (Blank rows = not tested)  HAND FUNCTION: Grip strength: Right: 39 lbs; Left: 28 lbs, Lateral pinch: Right: 11 lbs, Left: 9 lbs, and 3 point pinch: Right: 9 lbs, Left: 9 lbs  COORDINATION: 9 Hole Peg test: Right: 30 sec; Left: 36 sec  SENSATION: WFL  EDEMA:   MUSCLE TONE:   COGNITION: Overall cognitive status: Within functional limits for tasks assessed  VISION: Subjective report: Pt. Reports changes in her vision at onset of stroke, starting with blurry vision, resulting in vision loss in the R eye.  Baseline vision: Pt. Is able to visually track  Visual history: Hx of eye surgery  VISION ASSESSMENT:  TBD     PERCEPTION: WFL  PRAXIS: WFL  OBSERVATIONS:                                                                                                                              TREATMENT DATE: 01/03/2024   Therapeutic Activities:   -Pt. performed visual scanning tasks for horizontal left to right visual scanning strategies utilizing visual compensatory strategies with, and without an anchor.  -pt. Performed visual scanning tasks using the [employer] Telephone number copy.  Pt. was able to copy 100% of the telephone numbers accurately, intermittently using her left red painted index finger nail as a visual guide.  -visual screening using the [employer] to help determine how vision may be affecting ADL/IADL functioning.  -Reviewed visual compensatory strategies for tabletop tasks at home.   PATIENT EDUCATION: Education details: visual compensatory strategies for tabletop tasks at home,  ADLs/IADLs Person educated: Patient and Spouse Education method: Explanation and Verbal cues Education comprehension: verbalized understanding and returned demonstration  HOME EXERCISE PROGRAM:  - large print word searches  GOALS: Goals reviewed with patient? Yes  SHORT TERM GOALS: Target date: 02/06/2024    Pt. Will independently utilize HEP for hand strength, coordination, and visual compensatory strategies ADL/ADLs Baseline: Eval: No  current HEP  Goal status: INITIAL    LONG TERM GOALS: Target date: 03/19/2024    Pt. Will be able to independently implement visual scanning/visual search compensatory strategies for tasks within her extra personal space navigating through community environments 100% of the time.  Baseline: Eval: Pt. Requires increased assist to navigate through community environments. Goal status: INITIAL  2.  Pt. Will be able to independently utilize visual scanning/visual search strategies  during ADLS/IADL within her near space, and during tabletop top tasks. 100%  of the time.  Baseline: Eval: Pt. Education to be provided.  Goal status: INITIAL  3.  Pt. Will be able to independently initiate visual scanning techniques in her own environment to reduce risk of falls.  Baseline: Eval: Pt. Education to be provided.  Goal status: INITIAL  4.  Pt. Will  increase BUE Grip Strength by 5# to be able to independently hold objects for ADL/IADL use. Baseline: Eval: Right Grip: 39#, Left Grip: 28# Goal status: INITIAL  5.  Pt. Will increase L Lateral Pinch strength by 2# to be able to independently twist off bottle caps/lids. Baseline:  Goal status: INITIAL  6.  Pt. Will increase BUE strength for shoulder flexion/abduction by 2 mm grades to independently complete hair care tasks. Baseline: Eval: Right Shoulder Flexion: 4-/5, Left Shoulder Flexion:4-/5, Right Shoulder Abduction: 4-/5, Left Shoulder Abduction: 4-/5 Goal status: INITIAL  ASSESSMENT:  CLINICAL IMPRESSION:  Pt. reports having an appointment with her neurologist tomorrow. Pt. Had a follow-up appointment with her PCP yesterday, and had a BP medication change. Pt. Has an appointment scheduled with [facility] this month. Pt. Requires cues, increased time, and assist to perform tabletop ADL/IADL tasks. And requires assist to utilize visual compensatory strategies. Pt. 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  Pt. continues benefit from Occupational Therapy services to improve her ability to use visual compensatory strategies, and improve overall BUE functioning in order to improve engagement in, and maximize overall independence with ADL, and IADL tasks.  PERFORMANCE DEFICITS: in functional skills including ADLs, IADLs, coordination, dexterity, Fine motor control, and vision, and psychosocial skills including coping strategies, environmental adaptation, habits, interpersonal interactions, and routines and behaviors.   IMPAIRMENTS: are  limiting patient from ADLs, IADLs, rest and sleep, work, leisure, and social participation.   CO-MORBIDITIES: may have co-morbidities  that affects occupational performance. Patient will benefit from skilled OT to address above impairments and improve overall function.  MODIFICATION OR ASSISTANCE TO COMPLETE EVALUATION: Min-Moderate modification of tasks or assist with assess necessary to complete an evaluation.  OT OCCUPATIONAL PROFILE AND HISTORY: Detailed assessment: Review of records and additional review of physical, cognitive, psychosocial history related to current functional performance.  CLINICAL DECISION MAKING: Moderate - several treatment options, min-mod task modification necessary  REHAB POTENTIAL: Good  EVALUATION COMPLEXITY: Moderate    PLAN:  OT FREQUENCY: 2x/week  OT DURATION: 12 weeks  PLANNED INTERVENTIONS: 97168 OT Re-evaluation, 97535 self care/ADL training, 02889 therapeutic exercise, 97530 therapeutic activity, 97112 neuromuscular re-education, visual/perceptual remediation/compensation, patient/family education, and DME and/or AE instructions  RECOMMENDED OTHER SERVICES: ST  CONSULTED AND AGREED WITH PLAN OF CARE: Patient and family member/caregiver  PLAN FOR NEXT SESSION: Treatment  Richardson Otter, MS, OTR/L   01/03/2024, 5:50 PM

## 2024-01-08 ENCOUNTER — Ambulatory Visit: Admitting: Occupational Therapy

## 2024-01-08 DIAGNOSIS — H543 Unqualified visual loss, both eyes: Secondary | ICD-10-CM

## 2024-01-08 DIAGNOSIS — H547 Unspecified visual loss: Secondary | ICD-10-CM | POA: Diagnosis not present

## 2024-01-08 NOTE — Therapy (Signed)
 OUTPATIENT OCCUPATIONAL THERAPY NEURO TREATMENT  NOTE  Patient Name: Ashley Collins MRN: [id number] DOB:[birth year], 63 y.o., female Today's Date: 01/08/2024  PCP: Harvey Gaetana CROME, NP REFERRING PROVIDER: Harvey Gaetana CROME, NP   OT End of Session - 01/08/24 1523     Visit Number 4    Number of Visits 24    Date for OT Re-Evaluation 03/20/24    OT Start Time 1400    OT Stop Time 1445    OT Time Calculation (min) 45 min    Activity Tolerance Patient tolerated treatment well    Behavior During Therapy WFL for tasks assessed/performed          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   Cardiac arrhythmia    Nonspecific ST T wave changes on EKG   Chronic venous insufficiency of lower extremity    Complication of anesthesia    nausea and vomiting   DDD (degenerative disc disease), lumbosacral    Essential hypertension    Headache    History of kidney stones    Hyperlipidemia    Kidney stones    Lymphedema    Migraines    Osteoporosis    PONV (postoperative nausea and vomiting)    Right ureteral stone    Vitamin B12 deficiency    Vitamin D deficiency    Past Surgical History:  Procedure Laterality Date   AUGMENTATION MAMMAPLASTY     CESAREAN SECTION     x 4   COLONOSCOPY WITH PROPOFOL  N/A 03/31/2022   Procedure: COLONOSCOPY WITH PROPOFOL ;  Surgeon: Onita Elspeth Sharper, DO;  Location: [facility] ENDOSCOPY;  Service: Gastroenterology;  Laterality: N/A;   CYSTOSCOPY W/ RETROGRADES Bilateral 07/10/2020   Procedure: CYSTOSCOPY WITH RETROGRADE PYELOGRAM;  Surgeon: Francisca Redell BROCKS, MD;  Location: [facility] ORS;  Service: Urology;  Laterality: Bilateral;   CYSTOSCOPY/URETEROSCOPY/HOLMIUM LASER/STENT PLACEMENT     CYSTOSCOPY/URETEROSCOPY/HOLMIUM LASER/STENT PLACEMENT Bilateral 07/10/2020   Procedure: CYSTOSCOPY/URETEROSCOPY/HOLMIUM LASER/STENT PLACEMENT;  Surgeon: Francisca Redell BROCKS, MD;  Location: [facility] ORS;  Service:  Urology;  Laterality: Bilateral;   CYSTOSCOPY/URETEROSCOPY/HOLMIUM LASER/STENT PLACEMENT Right 08/25/2023   Procedure: CYSTOSCOPY/URETEROSCOPY/HOLMIUM LASER;  Surgeon: Francisca Redell BROCKS, MD;  Location: [facility] ORS;  Service: Urology;  Laterality: Right;   CYSTOSCOPY/URETEROSCOPY/HOLMIUM LASER/STENT PLACEMENT Right 12/15/2023   Procedure: CYSTOSCOPY/URETEROSCOPY/HOLMIUM LASER;  Surgeon: Francisca Redell BROCKS, MD;  Location: [facility] ORS;  Service: Urology;  Laterality: Right;   EXTRACORPOREAL SHOCK WAVE LITHOTRIPSY     x 10 plus   Eye Lift     FACIAL COSMETIC SURGERY     GANGLION CYST EXCISION Right 12/21/2021   Procedure: REMOVAL GANGLION OF WRIST;  Surgeon: Kathlynn Sharper, MD;  Location: [facility] ORS;  Service: Orthopedics;  Laterality: Right;   LIPOSUCTION     TONSILLECTOMY     TRIGGER FINGER RELEASE Right 12/21/2021   Procedure: RELEASE TRIGGER FINGER/A-1 PULLEY;  Surgeon: Kathlynn Sharper, MD;  Location: [facility] ORS;  Service: Orthopedics;  Laterality: Right;   URETEROSCOPY WITH HOLMIUM LASER LITHOTRIPSY     Patient Active Problem List   Diagnosis Date Noted   Acute CVA (cerebrovascular accident) (HCC) 12/19/2023   Hypokalemia 12/19/2023   AKI (acute kidney injury) (HCC) 12/19/2023   Leukocytosis 12/19/2023   Hyperlipidemia, unspecified 12/19/2023   Essential hypertension 12/19/2023   Vision changes 12/19/2023   Lymphedema 05/22/2023   Chronic venous insufficiency 05/22/2023   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 replacement therapy 04/10/2023   Insomnia due to  medical condition 04/25/2022   GAD (generalized anxiety disorder) 02/02/2022   Other specified depressive episodes 02/02/2022   High risk medication use 02/02/2022   Migraine with aura and without status migrainosus, not intractable 11/03/2020   Arrhythmia 08/06/2020   Status migrainosus 02/25/2019   Osteoporosis 04/09/2013   Vitamin D deficiency 04/09/2013   DDD (degenerative disc disease), lumbosacral 02/08/2005    ONSET DATE:  12/18/2023  REFERRING DIAG:   THERAPY DIAG:  Low vision, both eyes  Rationale for Evaluation and Treatment: Rehabilitation  SUBJECTIVE:   SUBJECTIVE STATEMENT:  Pt. reports  having had an MD appointment with the neurologist last week. Pt accompanied by: significant other  PERTINENT HISTORY: Pt. Has Hx of stroke with onset 12/18/2023. Pt. PMHx includes: HTN, Hyperlipidemia, Kidney Stones, near syncopal event, loss of vision.  PRECAUTIONS: None  WEIGHT BEARING RESTRICTIONS:  PAIN:  Are you having pain? No  FALLS: Has patient fallen in last 6 months? Yes. Number of falls    LIVING ENVIRONMENT: Lives with: lives with their family Lives in: [place], [place] Stairs: No, inside the house, yes but does not use Has following equipment at home:   PLOF: Independent  PATIENT GOALS: To be able to see  OBJECTIVE:  Note: Objective measures were completed at Evaluation unless otherwise noted.  HAND DOMINANCE: Right  ADLs:  Eating: Drinking from straw is different, able to use utensils.  Grooming: Fatigues completing hair care.  UB Dressing: independent, if clothes are in front of her she can find clothing LB Dressing: Independent Toileting: Independent Bathing: Independent Tub Shower transfers: Independent Equipment: none Has difficulty with using cell phone  IADLs: Shopping: Does not typically go out shopping, and has not tried to. Light housekeeping: Pt. reports that she does not typically do house cleaning. Pt. Has been able to unpack belongings from boxes due to her recent move in. Meal Prep: Pt. reports that is does not currently cook, however reports that she probably could. Has difficulty opening bottle caps/lids. Community mobility: Requires assistance from husband to navigate  through buildings, negotiate stairs-Pt. With increased fear of falling  Medication management: Able to push down medication bottles to open  Financial management: No change in the process-uses  automatic bill pay system.  Has difficulty with using cell phone Handwriting: N/T Work: Pt. was actively working managing a Health visitor, works a lot of hours  MOBILITY STATUS: Independent and Needs Assist: Requires hand on hand assistance with nvigating through environments.   POSTURE COMMENTS:  No Significant postural limitations Sitting balance: Good  ACTIVITY TOLERANCE: Activity tolerance: Good  FUNCTIONAL OUTCOME MEASURES:   UPPER EXTREMITY ROM:    Active ROM Right Eval WFL Left Eval [facility]  Shoulder flexion    Shoulder abduction    Shoulder adduction    Shoulder extension    Shoulder internal rotation    Shoulder external rotation    Elbow flexion    Elbow extension    Wrist flexion    Wrist extension    Wrist ulnar deviation    Wrist radial deviation    Wrist pronation    Wrist supination    (Blank rows = not tested)  UPPER EXTREMITY MMT:     MMT Right eval Left eval  Shoulder flexion 4-/5 4-/5  Shoulder abduction 4-/5 4-/5  Shoulder adduction    Shoulder extension    Shoulder internal rotation    Shoulder external rotation    Middle trapezius    Lower trapezius    Elbow flexion 5/5 5/5  Elbow extension 5/5 5/5  Wrist flexion 4/5 4-/5  Wrist extension 4/5 4-/5  Wrist ulnar deviation    Wrist radial deviation    Wrist pronation    Wrist supination    (Blank rows = not tested)  HAND FUNCTION: Grip strength: Right: 39 lbs; Left: 28 lbs, Lateral pinch: Right: 11 lbs, Left: 9 lbs, and 3 point pinch: Right: 9 lbs, Left: 9 lbs  COORDINATION: 9 Hole Peg test: Right: 30 sec; Left: 36 sec  SENSATION: WFL  EDEMA:   MUSCLE TONE:   COGNITION: Overall cognitive status: Within functional limits for tasks assessed  VISION: Subjective report: Pt. Reports changes in her vision at onset of stroke, starting with blurry vision, resulting in vision loss in the R eye.  Baseline vision: Pt. Is able to visually track  Visual history: Hx of eye  surgery  VISION ASSESSMENT: TBD     PERCEPTION: WFL  PRAXIS: WFL  OBSERVATIONS:                                                                                                                              TREATMENT DATE: 01/08/2024   Therapeutic Activities:   -Pt. performed visual scanning tasks for horizontal left to right visual scanning strategies combined with visual memory tasks utilizing visual compensatory strategies with, and without an anchor.   -Pt. performed tabletop visual scanning, visual saccades tasks while progressively widening the scanning plane towards the far right, and the far left.  -Reviewed visual compensatory strategies for tabletop tasks at home.  -Pt. Performed visual scanning in her extrapersonal space using a scan course down a long hallway visually scanning left to right to identify objects while maintaining her pace. Pt. Was further challenged with a speed component with maintaining an increased speed.  -Pt. Worked on visual scanning tasks in her extraperasonal space visually scanning for a sequence of objects within the kitchen.   PATIENT EDUCATION: Education details: visual compensatory strategies for work related tasks (i.e. IT sales professional) and ADLs/IADLs Person educated: Patient and Spouse Education method: Explanation and Verbal cues Education comprehension: verbalized understanding and returned demonstration  HOME EXERCISE PROGRAM:  - large print word searches  GOALS: Goals reviewed with patient? Yes  SHORT TERM GOALS: Target date: 02/06/2024    Pt. Will independently utilize HEP for hand strength, coordination, and visual compensatory strategies ADL/ADLs Baseline: Eval: No  current HEP  Goal status: INITIAL    LONG TERM GOALS: Target date: 03/19/2024    Pt. Will be able to independently implement visual scanning/visual search compensatory strategies for tasks within her extra personal space navigating through community  environments 100% of the time.  Baseline: Eval: Pt. Requires increased assist to navigate through community environments. Goal status: INITIAL  2.  Pt. Will be able to independently utilize visual scanning/visual search strategies  during ADLS/IADL within her near space, and during tabletop top tasks. 100% of the time.  Baseline: Eval: Pt. Education to be provided.  Goal status: INITIAL  3.  Pt. Will be able to independently initiate visual scanning techniques in her own environment to reduce risk of falls.  Baseline: Eval: Pt. Education to be provided.  Goal status: INITIAL  4.  Pt. Will increase BUE Grip Strength by 5# to be able to independently hold objects for ADL/IADL use. Baseline: Eval: Right Grip: 39#, Left Grip: 28# Goal status: INITIAL  5.  Pt. Will increase L Lateral Pinch strength by 2# to be able to independently twist off bottle caps/lids. Baseline:  Goal status: INITIAL  6.  Pt. Will increase BUE strength for shoulder flexion/abduction by 2 mm grades to independently complete hair care tasks. Baseline: Eval: Right Shoulder Flexion: 4-/5, Left Shoulder Flexion:4-/5, Right Shoulder Abduction: 4-/5, Left Shoulder Abduction: 4-/5 Goal status: INITIAL  ASSESSMENT:  CLINICAL IMPRESSION:  Pt. has an appointment scheduled with [facility] on Thursday this week. Pt. is improving, however, continues to require cues, increased time, and assist to perform tabletop ADL/IADL tasks.  Pt. Was able to efficiently identify 100% of the items to the left, and right on the hallway scancourse while maintaining both a standard pace, and a faster pace. Pt. Required increased time to complete visual scanning for a sequence of items in the IADL kitchen. Pt. education was provide about visual compensatory strategies for work related tasks. Pt. continues to benefit from Occupational Therapy services to improve her ability to use visual compensatory strategies, and improve overall BUE  functioning in order to improve engagement in, and maximize overall independence with ADL, and IADL tasks.  PERFORMANCE DEFICITS: in functional skills including ADLs, IADLs, coordination, dexterity, Fine motor control, and vision, and psychosocial skills including coping strategies, environmental adaptation, habits, interpersonal interactions, and routines and behaviors.   IMPAIRMENTS: are limiting patient from ADLs, IADLs, rest and sleep, work, leisure, and social participation.   CO-MORBIDITIES: may have co-morbidities  that affects occupational performance. Patient will benefit from skilled OT to address above impairments and improve overall function.  MODIFICATION OR ASSISTANCE TO COMPLETE EVALUATION: Min-Moderate modification of tasks or assist with assess necessary to complete an evaluation.  OT OCCUPATIONAL PROFILE AND HISTORY: Detailed assessment: Review of records and additional review of physical, cognitive, psychosocial history related to current functional performance.  CLINICAL DECISION MAKING: Moderate - several treatment options, min-mod task modification necessary  REHAB POTENTIAL: Good  EVALUATION COMPLEXITY: Moderate    PLAN:  OT FREQUENCY: 2x/week  OT DURATION: 12 weeks  PLANNED INTERVENTIONS: 97168 OT Re-evaluation, 97535 self care/ADL training, 02889 therapeutic exercise, 97530 therapeutic activity, 97112 neuromuscular re-education, visual/perceptual remediation/compensation, patient/family education, and DME and/or AE instructions  RECOMMENDED OTHER SERVICES: ST  CONSULTED AND AGREED WITH PLAN OF CARE: Patient and family member/caregiver  PLAN FOR NEXT SESSION: Treatment  Richardson Otter, MS, OTR/L   01/08/2024, 5:31 PM

## 2024-01-10 ENCOUNTER — Ambulatory Visit: Admitting: Occupational Therapy

## 2024-01-10 DIAGNOSIS — H543 Unqualified visual loss, both eyes: Secondary | ICD-10-CM

## 2024-01-10 DIAGNOSIS — H547 Unspecified visual loss: Secondary | ICD-10-CM | POA: Diagnosis not present

## 2024-01-10 NOTE — Therapy (Signed)
 OUTPATIENT OCCUPATIONAL THERAPY NEURO TREATMENT  NOTE  Patient Name: Ashley Collins MRN: [id number] DOB:Nov 27, 1960, 63 y.o., female Today's Date: 01/10/2024  PCP: Harvey Gaetana CROME, NP REFERRING PROVIDER: Harvey Gaetana CROME, NP   OT End of Session - 01/10/24 1411     Visit Number 5    Number of Visits 24    Date for OT Re-Evaluation 03/20/24    OT Start Time 1320    OT Stop Time 1400    OT Time Calculation (min) 40 min    Activity Tolerance Patient tolerated treatment well    Behavior During Therapy WFL for tasks assessed/performed          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   Cardiac arrhythmia    Nonspecific ST T wave changes on EKG   Chronic venous insufficiency of lower extremity    Complication of anesthesia    nausea and vomiting   DDD (degenerative disc disease), lumbosacral    Essential hypertension    Headache    History of kidney stones    Hyperlipidemia    Kidney stones    Lymphedema    Migraines    Osteoporosis    PONV (postoperative nausea and vomiting)    Right ureteral stone    Vitamin B12 deficiency    Vitamin D deficiency    Past Surgical History:  Procedure Laterality Date   AUGMENTATION MAMMAPLASTY     CESAREAN SECTION     x 4   COLONOSCOPY WITH PROPOFOL  N/A 03/31/2022   Procedure: COLONOSCOPY WITH PROPOFOL ;  Surgeon: Onita Elspeth Sharper, DO;  Location: [facility] ENDOSCOPY;  Service: Gastroenterology;  Laterality: N/A;   CYSTOSCOPY W/ RETROGRADES Bilateral 07/10/2020   Procedure: CYSTOSCOPY WITH RETROGRADE PYELOGRAM;  Surgeon: Francisca Redell BROCKS, MD;  Location: [facility] ORS;  Service: Urology;  Laterality: Bilateral;   CYSTOSCOPY/URETEROSCOPY/HOLMIUM LASER/STENT PLACEMENT     CYSTOSCOPY/URETEROSCOPY/HOLMIUM LASER/STENT PLACEMENT Bilateral 07/10/2020   Procedure: CYSTOSCOPY/URETEROSCOPY/HOLMIUM LASER/STENT PLACEMENT;  Surgeon: Francisca Redell BROCKS, MD;  Location: [facility] ORS;  Service:  Urology;  Laterality: Bilateral;   CYSTOSCOPY/URETEROSCOPY/HOLMIUM LASER/STENT PLACEMENT Right 08/25/2023   Procedure: CYSTOSCOPY/URETEROSCOPY/HOLMIUM LASER;  Surgeon: Francisca Redell BROCKS, MD;  Location: [facility] ORS;  Service: Urology;  Laterality: Right;   CYSTOSCOPY/URETEROSCOPY/HOLMIUM LASER/STENT PLACEMENT Right 12/15/2023   Procedure: CYSTOSCOPY/URETEROSCOPY/HOLMIUM LASER;  Surgeon: Francisca Redell BROCKS, MD;  Location: [facility] ORS;  Service: Urology;  Laterality: Right;   EXTRACORPOREAL SHOCK WAVE LITHOTRIPSY     x 10 plus   Eye Lift     FACIAL COSMETIC SURGERY     GANGLION CYST EXCISION Right 12/21/2021   Procedure: REMOVAL GANGLION OF WRIST;  Surgeon: Kathlynn Sharper, MD;  Location: [facility] ORS;  Service: Orthopedics;  Laterality: Right;   LIPOSUCTION     TONSILLECTOMY     TRIGGER FINGER RELEASE Right 12/21/2021   Procedure: RELEASE TRIGGER FINGER/A-1 PULLEY;  Surgeon: Kathlynn Sharper, MD;  Location: [facility] ORS;  Service: Orthopedics;  Laterality: Right;   URETEROSCOPY WITH HOLMIUM LASER LITHOTRIPSY     Patient Active Problem List   Diagnosis Date Noted   Acute CVA (cerebrovascular accident) (HCC) 12/19/2023   Hypokalemia 12/19/2023   AKI (acute kidney injury) (HCC) 12/19/2023   Leukocytosis 12/19/2023   Hyperlipidemia, unspecified 12/19/2023   Essential hypertension 12/19/2023   Vision changes 12/19/2023   Lymphedema 05/22/2023   Chronic venous insufficiency 05/22/2023   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 replacement therapy 04/10/2023   Insomnia due to  medical condition 04/25/2022   GAD (generalized anxiety disorder) 02/02/2022   Other specified depressive episodes 02/02/2022   High risk medication use 02/02/2022   Migraine with aura and without status migrainosus, not intractable 11/03/2020   Arrhythmia 08/06/2020   Status migrainosus 02/25/2019   Osteoporosis 04/09/2013   Vitamin D deficiency 04/09/2013   DDD (degenerative disc disease), lumbosacral 02/08/2005    ONSET DATE:  12/18/2023  REFERRING DIAG:   THERAPY DIAG:  Low vision, both eyes  Rationale for Evaluation and Treatment: Rehabilitation  SUBJECTIVE:   SUBJECTIVE STATEMENT:  Pt. reports having had an MD appointment with the neurologist last week. Pt accompanied by: significant other  PERTINENT HISTORY: Pt. Has Hx of stroke with onset 12/18/2023. Pt. PMHx includes: HTN, Hyperlipidemia, Kidney Stones, near syncopal event, loss of vision.  PRECAUTIONS: None  WEIGHT BEARING RESTRICTIONS:  PAIN:  Are you having pain? No  FALLS: Has patient fallen in last 6 months? Yes. Number of falls    LIVING ENVIRONMENT: Lives with: lives with their family Lives in: [place], [place] Stairs: No, inside the house, yes but does not use Has following equipment at home:   PLOF: Independent  PATIENT GOALS: To be able to see  OBJECTIVE:  Note: Objective measures were completed at Evaluation unless otherwise noted.  HAND DOMINANCE: Right  ADLs:  Eating: Drinking from straw is different, able to use utensils.  Grooming: Fatigues completing hair care.  UB Dressing: independent, if clothes are in front of her she can find clothing LB Dressing: Independent Toileting: Independent Bathing: Independent Tub Shower transfers: Independent Equipment: none Has difficulty with using cell phone  IADLs: Shopping: Does not typically go out shopping, and has not tried to. Light housekeeping: Pt. reports that she does not typically do house cleaning. Pt. Has been able to unpack belongings from boxes due to her recent move in. Meal Prep: Pt. reports that is does not currently cook, however reports that she probably could. Has difficulty opening bottle caps/lids. Community mobility: Requires assistance from husband to navigate  through buildings, negotiate stairs-Pt. With increased fear of falling  Medication management: Able to push down medication bottles to open  Financial management: No change in the process-uses  automatic bill pay system.  Has difficulty with using cell phone Handwriting: N/T Work: Pt. was actively working managing a Health visitor, works a lot of hours  MOBILITY STATUS: Independent and Needs Assist: Requires hand on hand assistance with nvigating through environments.   POSTURE COMMENTS:  No Significant postural limitations Sitting balance: Good  ACTIVITY TOLERANCE: Activity tolerance: Good  FUNCTIONAL OUTCOME MEASURES:   UPPER EXTREMITY ROM:    Active ROM Right Eval WFL Left Eval [facility]  Shoulder flexion    Shoulder abduction    Shoulder adduction    Shoulder extension    Shoulder internal rotation    Shoulder external rotation    Elbow flexion    Elbow extension    Wrist flexion    Wrist extension    Wrist ulnar deviation    Wrist radial deviation    Wrist pronation    Wrist supination    (Blank rows = not tested)  UPPER EXTREMITY MMT:     MMT Right eval Left eval  Shoulder flexion 4-/5 4-/5  Shoulder abduction 4-/5 4-/5  Shoulder adduction    Shoulder extension    Shoulder internal rotation    Shoulder external rotation    Middle trapezius    Lower trapezius    Elbow flexion 5/5 5/5  Elbow extension 5/5 5/5  Wrist flexion 4/5 4-/5  Wrist extension 4/5 4-/5  Wrist ulnar deviation    Wrist radial deviation    Wrist pronation    Wrist supination    (Blank rows = not tested)  HAND FUNCTION: Grip strength: Right: 39 lbs; Left: 28 lbs, Lateral pinch: Right: 11 lbs, Left: 9 lbs, and 3 point pinch: Right: 9 lbs, Left: 9 lbs  COORDINATION: 9 Hole Peg test: Right: 30 sec; Left: 36 sec  SENSATION: WFL  EDEMA:   MUSCLE TONE:   COGNITION: Overall cognitive status: Within functional limits for tasks assessed  VISION: Subjective report: Pt. Reports changes in her vision at onset of stroke, starting with blurry vision, resulting in vision loss in the R eye.  Baseline vision: Pt. Is able to visually track  Visual history: Hx of eye  surgery  VISION ASSESSMENT: TBD     PERCEPTION: WFL  PRAXIS: WFL  OBSERVATIONS:                                                                                                                              TREATMENT DATE: 01/10/2024   Therapeutic Activities:   -Pt. performed visual scanning tasks for horizontal left to right visual scanning strategies combined with visual memory tasks utilizing visual compensatory strategies with, and without an anchor.   -Pt. performed tabletop visual scanning tasks identifying underlined letters on a page.  -Pt. worked on visual scanning tasks in her extraperasonal space visually scanning for a sequence of objects within the kitchen with fewer cues required.  -Pt. Worked on visual scanning through cards placed randomly on the tabletop surface. Pt. worked on retrieving cards that add up to various high numbers, as well as matching cards in pairs with minimal cues     PATIENT EDUCATION: Education details: visual compensatory strategies for work related tasks (i.e. IT sales professional) and ADLs/IADLs Person educated: Patient and Spouse Education method: Explanation and Verbal cues Education comprehension: verbalized understanding and returned demonstration  HOME EXERCISE PROGRAM:  -visual scanning with in her home environment.  GOALS: Goals reviewed with patient? Yes  SHORT TERM GOALS: Target date: 02/06/2024    Pt. Will independently utilize HEP for hand strength, coordination, and visual compensatory strategies ADL/ADLs Baseline: Eval: No  current HEP  Goal status: INITIAL    LONG TERM GOALS: Target date: 03/19/2024    Pt. Will be able to independently implement visual scanning/visual search compensatory strategies for tasks within her extra personal space navigating through community environments 100% of the time.  Baseline: Eval: Pt. Requires increased assist to navigate through community environments. Goal status: INITIAL  2.  Pt.  Will be able to independently utilize visual scanning/visual search strategies  during ADLS/IADL within her near space, and during tabletop top tasks. 100% of the time.  Baseline: Eval: Pt. Education to be provided.  Goal status: INITIAL  3.  Pt. Will be able to independently initiate visual scanning techniques in  her own environment to reduce risk of falls.  Baseline: Eval: Pt. Education to be provided.  Goal status: INITIAL  4.  Pt. Will increase BUE Grip Strength by 5# to be able to independently hold objects for ADL/IADL use. Baseline: Eval: Right Grip: 39#, Left Grip: 28# Goal status: INITIAL  5.  Pt. Will increase L Lateral Pinch strength by 2# to be able to independently twist off bottle caps/lids. Baseline:  Goal status: INITIAL  6.  Pt. Will increase BUE strength for shoulder flexion/abduction by 2 mm grades to independently complete hair care tasks. Baseline: Eval: Right Shoulder Flexion: 4-/5, Left Shoulder Flexion:4-/5, Right Shoulder Abduction: 4-/5, Left Shoulder Abduction: 4-/5 Goal status: INITIAL  ASSESSMENT:  CLINICAL IMPRESSION:  Pt. has an appointment scheduled with [facility] tomorrow. Pt. required fewer cues to perform tabletop ADL/IADL tasks. Pt. required fewer cues for visual scanning through the kitchen, identifying items within the kitchen, and was able to complete it independently and efficiently.  Pt. requires less time to complete visual scanning tasks. Pt. presents with LOB when reaching down to pick something up off of the floor, however, Pt. was able to self-correct independently. Pt. reports that she typically loses her balance now when bending over to reach for something off of the floor. Pt. continues to benefit from Occupational Therapy services to improve her ability to use visual compensatory strategies, and improve overall BUE functioning in order to improve engagement in, and maximize overall independence with ADL, and IADL  tasks.  PERFORMANCE DEFICITS: in functional skills including ADLs, IADLs, coordination, dexterity, Fine motor control, and vision, and psychosocial skills including coping strategies, environmental adaptation, habits, interpersonal interactions, and routines and behaviors.   IMPAIRMENTS: are limiting patient from ADLs, IADLs, rest and sleep, work, leisure, and social participation.   CO-MORBIDITIES: may have co-morbidities  that affects occupational performance. Patient will benefit from skilled OT to address above impairments and improve overall function.  MODIFICATION OR ASSISTANCE TO COMPLETE EVALUATION: Min-Moderate modification of tasks or assist with assess necessary to complete an evaluation.  OT OCCUPATIONAL PROFILE AND HISTORY: Detailed assessment: Review of records and additional review of physical, cognitive, psychosocial history related to current functional performance.  CLINICAL DECISION MAKING: Moderate - several treatment options, min-mod task modification necessary  REHAB POTENTIAL: Good  EVALUATION COMPLEXITY: Moderate    PLAN:  OT FREQUENCY: 2x/week  OT DURATION: 12 weeks  PLANNED INTERVENTIONS: 97168 OT Re-evaluation, 97535 self care/ADL training, 02889 therapeutic exercise, 97530 therapeutic activity, 97112 neuromuscular re-education, visual/perceptual remediation/compensation, patient/family education, and DME and/or AE instructions  RECOMMENDED OTHER SERVICES: ST  CONSULTED AND AGREED WITH PLAN OF CARE: Patient and family member/caregiver  PLAN FOR NEXT SESSION: Treatment  Richardson Otter, MS, OTR/L   01/10/2024, 2:18 PM

## 2024-01-11 NOTE — Progress Notes (Signed)
  Ashley Collins is a 63 y.o. 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

## 2024-01-15 ENCOUNTER — Ambulatory Visit

## 2024-01-15 DIAGNOSIS — H547 Unspecified visual loss: Secondary | ICD-10-CM | POA: Diagnosis not present

## 2024-01-15 DIAGNOSIS — H543 Unqualified visual loss, both eyes: Secondary | ICD-10-CM

## 2024-01-15 DIAGNOSIS — M6281 Muscle weakness (generalized): Secondary | ICD-10-CM

## 2024-01-15 NOTE — Therapy (Signed)
 OUTPATIENT OCCUPATIONAL THERAPY NEURO TREATMENT  NOTE  Patient Name: Ashley Collins MRN: [id number] DOB:March 28, 1961, 63 y.o., female Today's Date: 01/15/2024  PCP: Harvey Gaetana CROME, NP REFERRING PROVIDER: Harvey Gaetana CROME, NP   OT End of Session - 01/15/24 1153     Visit Number 6    Number of Visits 24    Date for OT Re-Evaluation 03/20/24    OT Start Time 1100    OT Stop Time 1145    OT Time Calculation (min) 45 min    Activity Tolerance Patient tolerated treatment well    Behavior During Therapy WFL for tasks assessed/performed          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   Cardiac arrhythmia    Nonspecific ST T wave changes on EKG   Chronic venous insufficiency of lower extremity    Complication of anesthesia    nausea and vomiting   DDD (degenerative disc disease), lumbosacral    Essential hypertension    Headache    History of kidney stones    Hyperlipidemia    Kidney stones    Lymphedema    Migraines    Osteoporosis    PONV (postoperative nausea and vomiting)    Right ureteral stone    Vitamin B12 deficiency    Vitamin D deficiency    Past Surgical History:  Procedure Laterality Date   AUGMENTATION MAMMAPLASTY     CESAREAN SECTION     x 4   COLONOSCOPY WITH PROPOFOL  N/A 03/31/2022   Procedure: COLONOSCOPY WITH PROPOFOL ;  Surgeon: Onita Elspeth Sharper, DO;  Location: [facility] ENDOSCOPY;  Service: Gastroenterology;  Laterality: N/A;   CYSTOSCOPY W/ RETROGRADES Bilateral 07/10/2020   Procedure: CYSTOSCOPY WITH RETROGRADE PYELOGRAM;  Surgeon: Francisca Redell BROCKS, MD;  Location: [facility] ORS;  Service: Urology;  Laterality: Bilateral;   CYSTOSCOPY/URETEROSCOPY/HOLMIUM LASER/STENT PLACEMENT     CYSTOSCOPY/URETEROSCOPY/HOLMIUM LASER/STENT PLACEMENT Bilateral 07/10/2020   Procedure: CYSTOSCOPY/URETEROSCOPY/HOLMIUM LASER/STENT PLACEMENT;  Surgeon: Francisca Redell BROCKS, MD;  Location: [facility] ORS;  Service:  Urology;  Laterality: Bilateral;   CYSTOSCOPY/URETEROSCOPY/HOLMIUM LASER/STENT PLACEMENT Right 08/25/2023   Procedure: CYSTOSCOPY/URETEROSCOPY/HOLMIUM LASER;  Surgeon: Francisca Redell BROCKS, MD;  Location: [facility] ORS;  Service: Urology;  Laterality: Right;   CYSTOSCOPY/URETEROSCOPY/HOLMIUM LASER/STENT PLACEMENT Right 12/15/2023   Procedure: CYSTOSCOPY/URETEROSCOPY/HOLMIUM LASER;  Surgeon: Francisca Redell BROCKS, MD;  Location: [facility] ORS;  Service: Urology;  Laterality: Right;   EXTRACORPOREAL SHOCK WAVE LITHOTRIPSY     x 10 plus   Eye Lift     FACIAL COSMETIC SURGERY     GANGLION CYST EXCISION Right 12/21/2021   Procedure: REMOVAL GANGLION OF WRIST;  Surgeon: Kathlynn Sharper, MD;  Location: [facility] ORS;  Service: Orthopedics;  Laterality: Right;   LIPOSUCTION     TONSILLECTOMY     TRIGGER FINGER RELEASE Right 12/21/2021   Procedure: RELEASE TRIGGER FINGER/A-1 PULLEY;  Surgeon: Kathlynn Sharper, MD;  Location: [facility] ORS;  Service: Orthopedics;  Laterality: Right;   URETEROSCOPY WITH HOLMIUM LASER LITHOTRIPSY     Patient Active Problem List   Diagnosis Date Noted   Acute CVA (cerebrovascular accident) (HCC) 12/19/2023   Hypokalemia 12/19/2023   AKI (acute kidney injury) (HCC) 12/19/2023   Leukocytosis 12/19/2023   Hyperlipidemia, unspecified 12/19/2023   Essential hypertension 12/19/2023   Vision changes 12/19/2023   Lymphedema 05/22/2023   Chronic venous insufficiency 05/22/2023   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 replacement therapy 04/10/2023   Insomnia due to  medical condition 04/25/2022   GAD (generalized anxiety disorder) 02/02/2022   Other specified depressive episodes 02/02/2022   High risk medication use 02/02/2022   Migraine with aura and without status migrainosus, not intractable 11/03/2020   Arrhythmia 08/06/2020   Status migrainosus 02/25/2019   Osteoporosis 04/09/2013   Vitamin D deficiency 04/09/2013   DDD (degenerative disc disease), lumbosacral 02/08/2005   ONSET DATE:  12/18/2023  REFERRING DIAG:   THERAPY DIAG:  Low vision, both eyes  Visual impairment  Muscle weakness (generalized)  Rationale for Evaluation and Treatment: Rehabilitation  SUBJECTIVE:  SUBJECTIVE STATEMENT: Pt reports being disappointed after her neuro ophthalmology appt at [facility] after being told there wasn't any medication or tx to improve her hemianopsia.   Pt accompanied by: significant other  PERTINENT HISTORY: Pt. Has Hx of stroke with onset 12/18/2023. Pt. PMHx includes: HTN, Hyperlipidemia, Kidney Stones, near syncopal event, loss of vision.  PRECAUTIONS: None  WEIGHT BEARING RESTRICTIONS:  PAIN:  Are you having pain? No  FALLS: Has patient fallen in last 6 months? Yes. Number of falls    LIVING ENVIRONMENT: Lives with: lives with their family Lives in: [place], [place] Stairs: No, inside the house, yes but does not use Has following equipment at home:   PLOF: Independent  PATIENT GOALS: To be able to see  OBJECTIVE:  Note: Objective measures were completed at Evaluation unless otherwise noted.  HAND DOMINANCE: Right  ADLs:  Eating: Drinking from straw is different, able to use utensils.  Grooming: Fatigues completing hair care.  UB Dressing: independent, if clothes are in front of her she can find clothing LB Dressing: Independent Toileting: Independent Bathing: Independent Tub Shower transfers: Independent Equipment: none Has difficulty with using cell phone  IADLs: Shopping: Does not typically go out shopping, and has not tried to. Light housekeeping: Pt. reports that she does not typically do house cleaning. Pt. Has been able to unpack belongings from boxes due to her recent move in. Meal Prep: Pt. reports that is does not currently cook, however reports that she probably could. Has difficulty opening bottle caps/lids. Community mobility: Requires assistance from husband to navigate  through buildings, negotiate stairs-Pt. With increased fear of  falling  Medication management: Able to push down medication bottles to open  Financial management: No change in the process-uses automatic bill pay system.  Has difficulty with using cell phone Handwriting: N/T Work: Pt. was actively working managing a Health visitor, works a lot of hours  MOBILITY STATUS: Independent and Needs Assist: Requires hand on hand assistance with nvigating through environments.   POSTURE COMMENTS:  No Significant postural limitations Sitting balance: Good  ACTIVITY TOLERANCE: Activity tolerance: Good  FUNCTIONAL OUTCOME MEASURES:   UPPER EXTREMITY ROM:    Active ROM Right Eval WFL Left Eval [facility]  Shoulder flexion    Shoulder abduction    Shoulder adduction    Shoulder extension    Shoulder internal rotation    Shoulder external rotation    Elbow flexion    Elbow extension    Wrist flexion    Wrist extension    Wrist ulnar deviation    Wrist radial deviation    Wrist pronation    Wrist supination    (Blank rows = not tested)  UPPER EXTREMITY MMT:     MMT Right eval Left eval  Shoulder flexion 4-/5 4-/5  Shoulder abduction 4-/5 4-/5  Shoulder adduction    Shoulder extension    Shoulder internal rotation    Shoulder external rotation  Middle trapezius    Lower trapezius    Elbow flexion 5/5 5/5  Elbow extension 5/5 5/5  Wrist flexion 4/5 4-/5  Wrist extension 4/5 4-/5  Wrist ulnar deviation    Wrist radial deviation    Wrist pronation    Wrist supination    (Blank rows = not tested)  HAND FUNCTION: Grip strength: Right: 39 lbs; Left: 28 lbs, Lateral pinch: Right: 11 lbs, Left: 9 lbs, and 3 point pinch: Right: 9 lbs, Left: 9 lbs  COORDINATION: 9 Hole Peg test: Right: 30 sec; Left: 36 sec  SENSATION: WFL  EDEMA:   MUSCLE TONE:   COGNITION: Overall cognitive status: Within functional limits for tasks assessed  VISION: Subjective report: Pt. Reports changes in her vision at onset of stroke, starting with  blurry vision, resulting in vision loss in the R eye.  Baseline vision: Pt. 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 and compensating with frequent head turns to scan personal and extrapersonal space -Fall prevention education reviewed d/t pt reporting fear of falling:   -Frequent head turns to identify obstacles that could cause tripping  -Organize environment to remove fall hazards (keep dog toys off floor)  -Rely on head turns and other senses (hearing) to identify people or objects approaching to the R in unfamiliar settings  -When with spouse, walk with spouse on pt's R side  -Sit in chair or hold to stable surface when picking up objects off the floor  -Squat rather than bend to pick items up from floor d/t reported dizziness with positional changes   -Reorganize refrigerator/closets/etc to bring lower items up to limit bending  -Reviewed home and job responsibilities.   Home: Pt is in the process of setting up her bills on auto pay.  Pt reports daughter is currently helping with medication set up but pt would like to work towards being indep with this.   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    PATIENT  EDUCATION: Education details: Condition management/fall prevention/visual compensation Person educated: Patient and Spouse Education method: Explanation and Verbal cues Education comprehension: verbalized understanding and returned demonstration  HOME EXERCISE PROGRAM: -visual scanning with in her  home environment. -send 1 short email daily to improve typing speed and accuracy  GOALS: Goals reviewed with patient? Yes  SHORT TERM GOALS: Target date: 02/06/2024    Pt. Will independently utilize HEP for hand strength, coordination, and visual compensatory strategies ADL/ADLs Baseline: Eval: No  current HEP  Goal status: INITIAL   LONG TERM GOALS: Target date: 03/19/2024    Pt. Will be able to independently implement visual scanning/visual search compensatory strategies for tasks within her extra personal space navigating through community environments 100% of the time.  Baseline: Eval: Pt. Requires increased assist to navigate through community environments. Goal status: INITIAL  2.  Pt. Will be able to independently utilize visual scanning/visual search strategies  during ADLS/IADL within her near space, and during tabletop top tasks. 100% of the time.  Baseline: Eval: Pt. Education to be provided.  Goal status: INITIAL  3.  Pt. Will be able to independently initiate visual scanning techniques in her own environment to reduce risk of falls.  Baseline: Eval: Pt. Education to be provided.  Goal status: INITIAL  4.  Pt. Will increase BUE Grip Strength by 5# to be able to independently hold objects for ADL/IADL use. Baseline: Eval: Right Grip: 39#, Left Grip: 28# Goal status: INITIAL  5.  Pt. Will increase L Lateral Pinch strength by 2# to be able to independently twist off bottle caps/lids. Baseline:  Goal status: INITIAL  6.  Pt. Will increase BUE strength for shoulder flexion/abduction by 2 mm grades to independently complete hair care tasks. Baseline: Eval: Right Shoulder  Flexion: 4-/5, Left Shoulder Flexion:4-/5, Right Shoulder Abduction: 4-/5, Left Shoulder Abduction: 4-/5 Goal status: INITIAL  7.  Pt will be indep with medication set up using weekly pill organizer.  Baseline: 01/15/24: Daughter currently manages medication set up.  Goal status: New  8. Pt will increase typing speed to 20-30 words per minute with at least 90% accuracy to work towards more efficient typing for job related  responsibilities.  Baseline: 01/15/24: 8wpm x88% accuracy=7 wpm  Goal status: New  9. Pt will be able to scan small print on store receipts to check for errors with 100% accuracy using visual compensation strategies as needed.  Baseline: 01/15/24: Not yet attempted; required for return to work/job responsibility  Goal status: New  ASSESSMENT: CLINICAL IMPRESSION: Pt observably disappointed with findings from her [facility] neuro ophthalmology appt last week, reporting that she was told there was no medication or tx to fix her vision.  Pt did come today with new Rx glasses for near and far vision, though pt reports that she feels like they should potentially be stronger.  OT encouraged pt to give them time, and her hemianopsia may be contributing to the feeling of reduced visual clarity despite use of her glasses.  In reviewing current challenges at home, pt described fear of falling, decreased balance, and some dizziness with head positional changes, contributing to fear of picking up dropped ADL supplies.  OT reviewed fall prevention measures and additionally recommended PT eval for deficits noted above.  OT reviewed possible orthostatic changes vs vestibular changes with recommendation to obtain order for PT eval.  Pt receptive to PT eval/tx as needed.  Fax sent to PCP today to request PT eval and tx.  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  Goal updates made today after review of personal goals for medication management and return to work job responsibilities.  HEP progressed to include sending daily emails d/t typing speed tested today and noted to be BFL for work related responsibilities.  Pt. continues to benefit from Occupational Therapy services to improve her ability to use visual compensatory strategies, and improve overall BUE functioning in order to improve engagement in, and maximize overall independence with ADL, and IADL tasks.  PERFORMANCE DEFICITS: in functional skills including ADLs, IADLs, coordination, dexterity, Fine motor control, and vision, and psychosocial skills including coping strategies, environmental adaptation, habits, interpersonal interactions, and routines and behaviors.   IMPAIRMENTS: are limiting patient from ADLs, IADLs, rest and sleep, work, leisure, and social participation.   CO-MORBIDITIES: may have co-morbidities  that affects occupational performance. Patient will benefit from skilled OT to address above impairments and improve overall function.  MODIFICATION OR ASSISTANCE TO COMPLETE EVALUATION: Min-Moderate modification of tasks or assist with assess necessary to complete an evaluation.  OT OCCUPATIONAL PROFILE AND HISTORY: Detailed assessment: Review of records and additional review of physical, cognitive, psychosocial history related to current functional performance.  CLINICAL DECISION MAKING: Moderate - several treatment options, min-mod task modification necessary  REHAB POTENTIAL: Good  EVALUATION COMPLEXITY: Moderate    PLAN:  OT FREQUENCY: 2x/week  OT DURATION: 12 weeks  PLANNED INTERVENTIONS: 97168 OT Re-evaluation, 97535 self care/ADL training, 02889 therapeutic exercise, 97530 therapeutic activity, 97112 neuromuscular re-education, visual/perceptual remediation/compensation,  patient/family education, and DME and/or AE instructions  RECOMMENDED OTHER SERVICES: ST  CONSULTED AND AGREED WITH PLAN OF CARE: Patient and family member/caregiver  PLAN FOR NEXT SESSION: Treatment  Inocente Blazing, MS, OTR/L  01/15/2024, 11:56 AM

## 2024-01-17 ENCOUNTER — Ambulatory Visit: Admitting: Occupational Therapy

## 2024-01-17 DIAGNOSIS — H543 Unqualified visual loss, both eyes: Secondary | ICD-10-CM

## 2024-01-17 DIAGNOSIS — H547 Unspecified visual loss: Secondary | ICD-10-CM | POA: Diagnosis not present

## 2024-01-17 NOTE — Therapy (Signed)
 OUTPATIENT OCCUPATIONAL THERAPY NEURO TREATMENT  NOTE  Patient Name: Ashley Collins MRN: [id number] DOB:[birth year], 63 y.o., female Today's Date: 01/17/2024  PCP: Harvey Gaetana CROME, NP REFERRING PROVIDER: Harvey Gaetana CROME, NP   OT End of Session - 01/17/24 1503     Visit Number 7    Number of Visits 24    Date for OT Re-Evaluation 03/20/24    OT Start Time 1318    OT Stop Time 1400    OT Time Calculation (min) 42 min    Activity Tolerance Patient tolerated treatment well    Behavior During Therapy WFL for tasks assessed/performed          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   Cardiac arrhythmia    Nonspecific ST T wave changes on EKG   Chronic venous insufficiency of lower extremity    Complication of anesthesia    nausea and vomiting   DDD (degenerative disc disease), lumbosacral    Essential hypertension    Headache    History of kidney stones    Hyperlipidemia    Kidney stones    Lymphedema    Migraines    Osteoporosis    PONV (postoperative nausea and vomiting)    Right ureteral stone    Vitamin B12 deficiency    Vitamin D deficiency    Past Surgical History:  Procedure Laterality Date   AUGMENTATION MAMMAPLASTY     CESAREAN SECTION     x 4   COLONOSCOPY WITH PROPOFOL  N/A 03/31/2022   Procedure: COLONOSCOPY WITH PROPOFOL ;  Surgeon: Onita Elspeth Sharper, DO;  Location: [facility] ENDOSCOPY;  Service: Gastroenterology;  Laterality: N/A;   CYSTOSCOPY W/ RETROGRADES Bilateral 07/10/2020   Procedure: CYSTOSCOPY WITH RETROGRADE PYELOGRAM;  Surgeon: Francisca Redell BROCKS, MD;  Location: [facility] ORS;  Service: Urology;  Laterality: Bilateral;   CYSTOSCOPY/URETEROSCOPY/HOLMIUM LASER/STENT PLACEMENT     CYSTOSCOPY/URETEROSCOPY/HOLMIUM LASER/STENT PLACEMENT Bilateral 07/10/2020   Procedure: CYSTOSCOPY/URETEROSCOPY/HOLMIUM LASER/STENT PLACEMENT;  Surgeon: Francisca Redell BROCKS, MD;  Location: [facility] ORS;  Service:  Urology;  Laterality: Bilateral;   CYSTOSCOPY/URETEROSCOPY/HOLMIUM LASER/STENT PLACEMENT Right 08/25/2023   Procedure: CYSTOSCOPY/URETEROSCOPY/HOLMIUM LASER;  Surgeon: Francisca Redell BROCKS, MD;  Location: [facility] ORS;  Service: Urology;  Laterality: Right;   CYSTOSCOPY/URETEROSCOPY/HOLMIUM LASER/STENT PLACEMENT Right 12/15/2023   Procedure: CYSTOSCOPY/URETEROSCOPY/HOLMIUM LASER;  Surgeon: Francisca Redell BROCKS, MD;  Location: [facility] ORS;  Service: Urology;  Laterality: Right;   EXTRACORPOREAL SHOCK WAVE LITHOTRIPSY     x 10 plus   Eye Lift     FACIAL COSMETIC SURGERY     GANGLION CYST EXCISION Right 12/21/2021   Procedure: REMOVAL GANGLION OF WRIST;  Surgeon: Kathlynn Sharper, MD;  Location: [facility] ORS;  Service: Orthopedics;  Laterality: Right;   LIPOSUCTION     TONSILLECTOMY     TRIGGER FINGER RELEASE Right 12/21/2021   Procedure: RELEASE TRIGGER FINGER/A-1 PULLEY;  Surgeon: Kathlynn Sharper, MD;  Location: [facility] ORS;  Service: Orthopedics;  Laterality: Right;   URETEROSCOPY WITH HOLMIUM LASER LITHOTRIPSY     Patient Active Problem List   Diagnosis Date Noted   Acute CVA (cerebrovascular accident) (HCC) 12/19/2023   Hypokalemia 12/19/2023   AKI (acute kidney injury) (HCC) 12/19/2023   Leukocytosis 12/19/2023   Hyperlipidemia, unspecified 12/19/2023   Essential hypertension 12/19/2023   Vision changes 12/19/2023   Lymphedema 05/22/2023   Chronic venous insufficiency 05/22/2023   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 replacement therapy 04/10/2023   Insomnia due to  medical condition 04/25/2022   GAD (generalized anxiety disorder) 02/02/2022   Other specified depressive episodes 02/02/2022   High risk medication use 02/02/2022   Migraine with aura and without status migrainosus, not intractable 11/03/2020   Arrhythmia 08/06/2020   Status migrainosus 02/25/2019   Osteoporosis 04/09/2013   Vitamin D deficiency 04/09/2013   DDD (degenerative disc disease), lumbosacral 02/08/2005   ONSET DATE:  12/18/2023  REFERRING DIAG:   THERAPY DIAG:  Low vision, both eyes  Rationale for Evaluation and Treatment: Rehabilitation  SUBJECTIVE:  SUBJECTIVE STATEMENT: Pt reports getting  2 new pairs of glasses, one for distance and one reading. Pt accompanied by: significant other  PERTINENT HISTORY: Pt. Has Hx of stroke with onset 12/18/2023. Pt. PMHx includes: HTN, Hyperlipidemia, Kidney Stones, near syncopal event, loss of vision.  PRECAUTIONS: None  WEIGHT BEARING RESTRICTIONS:  PAIN:  Are you having pain? No  FALLS: Has patient fallen in last 6 months? Yes. Number of falls    LIVING ENVIRONMENT: Lives with: lives with their family Lives in: [place], [place] Stairs: No, inside the house, yes but does not use Has following equipment at home:   PLOF: Independent  PATIENT GOALS: To be able to see  OBJECTIVE:  Note: Objective measures were completed at Evaluation unless otherwise noted.  HAND DOMINANCE: Right  ADLs:  Eating: Drinking from straw is different, able to use utensils.  Grooming: Fatigues completing hair care.  UB Dressing: independent, if clothes are in front of her she can find clothing LB Dressing: Independent Toileting: Independent Bathing: Independent Tub Shower transfers: Independent Equipment: none Has difficulty with using cell phone  IADLs: Shopping: Does not typically go out shopping, and has not tried to. Light housekeeping: Pt. reports that she does not typically do house cleaning. Pt. Has been able to unpack belongings from boxes due to her recent move in. Meal Prep: Pt. reports that is does not currently cook, however reports that she probably could. Has difficulty opening bottle caps/lids. Community mobility: Requires assistance from husband to navigate  through buildings, negotiate stairs-Pt. With increased fear of falling  Medication management: Able to push down medication bottles to open  Financial management: No change in the process-uses  automatic bill pay system.  Has difficulty with using cell phone Handwriting: N/T Work: Pt. was actively working managing a Health visitor, works a lot of hours  MOBILITY STATUS: Independent and Needs Assist: Requires hand on hand assistance with nvigating through environments.   POSTURE COMMENTS:  No Significant postural limitations Sitting balance: Good  ACTIVITY TOLERANCE: Activity tolerance: Good  FUNCTIONAL OUTCOME MEASURES:   UPPER EXTREMITY ROM:    Active ROM Right Eval WFL Left Eval [facility]  Shoulder flexion    Shoulder abduction    Shoulder adduction    Shoulder extension    Shoulder internal rotation    Shoulder external rotation    Elbow flexion    Elbow extension    Wrist flexion    Wrist extension    Wrist ulnar deviation    Wrist radial deviation    Wrist pronation    Wrist supination    (Blank rows = not tested)  UPPER EXTREMITY MMT:     MMT Right eval Left eval  Shoulder flexion 4-/5 4-/5  Shoulder abduction 4-/5 4-/5  Shoulder adduction    Shoulder extension    Shoulder internal rotation    Shoulder external rotation    Middle trapezius    Lower trapezius    Elbow flexion 5/5 5/5  Elbow extension 5/5 5/5  Wrist flexion 4/5 4-/5  Wrist extension 4/5 4-/5  Wrist ulnar deviation    Wrist radial deviation    Wrist pronation    Wrist supination    (Blank rows = not tested)  HAND FUNCTION: Grip strength: Right: 39 lbs; Left: 28 lbs, Lateral pinch: Right: 11 lbs, Left: 9 lbs, and 3 point pinch: Right: 9 lbs, Left: 9 lbs  COORDINATION: 9 Hole Peg test: Right: 30 sec; Left: 36 sec  SENSATION: WFL  EDEMA:   MUSCLE TONE:   COGNITION: Overall cognitive status: Within functional limits for tasks assessed  VISION: Subjective report: Pt. Reports changes in her vision at onset of stroke, starting with blurry vision, resulting in vision loss in the R eye.  Baseline vision: Pt. Is able to visually track  Visual history: Hx of eye  surgery  VISION ASSESSMENT: TBD  PERCEPTION: WFL  PRAXIS: WFL  OBSERVATIONS:                                                                                                                    TREATMENT DATE: 01/17/2024   Therapeutic Activities:  -Facilitated visual scanning tasks while standing from the far left to the far right between 2 columns of 23 items on a vertical whiteboard with 60% accuracy requiring moderate cuing. -Pt. performed tabletop visual scanning, visual search strategies, and visual memory tasks identifying 8 pairs of 8 (same number & same color). Pt. was able to accurately locate 1/8 of the pairs with cues, and increased time.  Self-care:  Pt. worked on the IADL of writing, copying one sentence in printed form. Pt. was able to complete writing one sentence with increased time, 75% legibility with inconsistent, and excessive spacing between the words.    PATIENT EDUCATION: Education details: Condition management/fall prevention/visual compensation Person educated: Patient and Spouse Education method: Explanation and Verbal cues Education comprehension: verbalized understanding and returned demonstration  HOME EXERCISE PROGRAM: -visual scanning with in her home environment. -send 1 short email daily to improve typing speed and accuracy  GOALS: Goals reviewed with patient? Yes  SHORT TERM GOALS: Target date: 02/06/2024    Pt. Will independently utilize HEP for hand strength, coordination, and visual compensatory strategies ADL/ADLs Baseline: Eval: No  current HEP  Goal status: INITIAL   LONG TERM GOALS: Target date: 03/19/2024    Pt. Will be able to independently implement visual scanning/visual search compensatory strategies for tasks within her extra personal space navigating through community environments 100% of the time.  Baseline: Eval: Pt. Requires increased assist to navigate through community environments. Goal status: INITIAL  2.  Pt. Will be  able to independently utilize visual scanning/visual search strategies  during ADLS/IADL within her near space, and during tabletop top tasks. 100% of the time.  Baseline: Eval: Pt. Education to be provided.  Goal status: INITIAL  3.  Pt. Will be able to independently initiate visual scanning techniques in her own environment to reduce risk of falls.  Baseline: Eval: Pt.  Education to be provided.  Goal status: INITIAL  4.  Pt. Will increase BUE Grip Strength by 5# to be able to independently hold objects for ADL/IADL use. Baseline: Eval: Right Grip: 39#, Left Grip: 28# Goal status: INITIAL  5.  Pt. Will increase L Lateral Pinch strength by 2# to be able to independently twist off bottle caps/lids. Baseline:  Goal status: INITIAL  6.  Pt. Will increase BUE strength for shoulder flexion/abduction by 2 mm grades to independently complete hair care tasks. Baseline: Eval: Right Shoulder Flexion: 4-/5, Left Shoulder Flexion:4-/5, Right Shoulder Abduction: 4-/5, Left Shoulder Abduction: 4-/5 Goal status: INITIAL  7.  Pt will be indep with medication set up using weekly pill organizer.  Baseline: 01/15/24: Daughter currently manages medication set up.  Goal status: New  8. Pt will increase typing speed to 20-30 words per minute with at least 90% accuracy to work towards more efficient typing for job related  responsibilities.  Baseline: 01/15/24: 8wpm x88% accuracy=7 wpm  Goal status: New  9. Pt will be able to scan small print on store receipts to check for errors with 100% accuracy using visual compensation strategies as needed.  Baseline: 01/15/24: Not yet attempted; required for return to work/job responsibility  Goal status: New  ASSESSMENT: CLINICAL IMPRESSION:  Pt. reports that she received 2 new prescriptions for glasses at her Eye Appointment at [facility]. Pt. has one pair of glasses for close/reading tasks, and one pair of glasses for distance. Pt. completed the visual scanning tasks  standing at the vertical whiteboard scanning to the far left, and the far right with 60% accuracy, with moderate cuing. Pt. completed a writing task copying one sentence with 75% legibility, however presented with inconsistent, and excessive spacing between the words in the sentence. Pt. was able to accurately locate, and match 1/8 of the visual memory items. Pt. continues to benefit from Occupational Therapy services to improve her ability to use visual compensatory strategies, and improve overall BUE functioning in order to improve engagement in, and maximize overall independence with ADL, and IADL tasks.  PERFORMANCE DEFICITS: in functional skills including ADLs, IADLs, coordination, dexterity, Fine motor control, and vision, and psychosocial skills including coping strategies, environmental adaptation, habits, interpersonal interactions, and routines and behaviors.   IMPAIRMENTS: are limiting patient from ADLs, IADLs, rest and sleep, work, leisure, and social participation.   CO-MORBIDITIES: may have co-morbidities  that affects occupational performance. Patient will benefit from skilled OT to address above impairments and improve overall function.  MODIFICATION OR ASSISTANCE TO COMPLETE EVALUATION: Min-Moderate modification of tasks or assist with assess necessary to complete an evaluation.  OT OCCUPATIONAL PROFILE AND HISTORY: Detailed assessment: Review of records and additional review of physical, cognitive, psychosocial history related to current functional performance.  CLINICAL DECISION MAKING: Moderate - several treatment options, min-mod task modification necessary  REHAB POTENTIAL: Good  EVALUATION COMPLEXITY: Moderate    PLAN:  OT FREQUENCY: 2x/week  OT DURATION: 12 weeks  PLANNED INTERVENTIONS: 97168 OT Re-evaluation, 97535 self care/ADL training, 02889 therapeutic exercise, 97530 therapeutic activity, 97112 neuromuscular re-education, visual/perceptual  remediation/compensation, patient/family education, and DME and/or AE instructions  RECOMMENDED OTHER SERVICES: ST  CONSULTED AND AGREED WITH PLAN OF CARE: Patient and family member/caregiver  PLAN FOR NEXT SESSION: Treatment  Richardson Otter, MS, OTR/L   01/17/2024, 3:10 PM

## 2024-01-18 ENCOUNTER — Encounter: Payer: Self-pay | Admitting: Psychiatry

## 2024-01-18 ENCOUNTER — Telehealth: Admitting: Psychiatry

## 2024-01-18 DIAGNOSIS — F411 Generalized anxiety disorder: Secondary | ICD-10-CM | POA: Diagnosis not present

## 2024-01-18 DIAGNOSIS — G4701 Insomnia due to medical condition: Secondary | ICD-10-CM | POA: Diagnosis not present

## 2024-01-18 DIAGNOSIS — Z79899 Other long term (current) drug therapy: Secondary | ICD-10-CM | POA: Diagnosis not present

## 2024-01-18 DIAGNOSIS — F3289 Other specified depressive episodes: Secondary | ICD-10-CM

## 2024-01-18 NOTE — Progress Notes (Unsigned)
 Virtual Visit via Video Note  I connected with Ashley Collins on 01/18/24 at  3:00 PM EDT by a video enabled telemedicine application and verified that I am speaking with the correct person using two identifiers.  Location Provider Location : [facility] Patient Location : Home  Participants: Patient , Provider    I discussed the limitations of evaluation and management by telemedicine and the availability of in person appointments. The patient expressed understanding and agreed to proceed.   I discussed the assessment and treatment plan with the patient. The patient was provided an opportunity to ask questions and all were answered. The patient agreed with the plan and demonstrated an understanding of the instructions.   The patient was advised to call back or seek an in-person evaluation if the symptoms worsen or if the condition fails to improve as anticipated.   BH MD OP Progress Note  01/19/2024 7:04 AM Ashley Collins  MRN:  [id number]  Chief Complaint:  Chief Complaint  Patient presents with   Follow-up   Anxiety   ADD   Medication Refill   Discussed the use of AI scribe software for clinical note transcription with the patient, who gave verbal consent to proceed.  History of Present Illness Ashley Collins is a 63 year old Caucasian female who is currently on FMLA, lives in [place], has a history of GAD, other specified depression, insomnia, long-term current use of benzodiazepines, migraine headaches, kidney stones was evaluated by telemedicine today.    She experienced a stroke on December 18, 2023, while at work, initially presenting with symptoms of sweating, vomiting, and visual disturbances. A CT scan confirmed the stroke, leading to a hospital admission for several days. Post-stroke, she has significant vision impairment, describing her vision as 'blurry' and 'cut off' at the sides. Glasses have been prescribed for both close-up and distance vision.  I have reviewed notes per Dr.  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   She is undergoing occupational therapy twice a week and will start physical therapy next week. Her current medications include lorazepam , atorvastatin , lisinopril , amitriptyline , Ambien , and vitamin D2. She has discontinued other medications like Lamictal  and is currently only on  lorazepam , Ambien , amitriptyline  for mood and sleep. She also receives Botox injections every three months for migraines.  She reports a supportive home environment with her daughter and neighbors assisting with meals and daily activities. Due to her vision issues, she is unable to drive and relies on her husband and daughter for transportation. She is actively engaging in therapy exercises at home, including crossword puzzles and card games, to improve cognitive function. No falls have occurred, but she remains cautious about her surroundings due to her vision limitations.  She experiences some memory fogginess, which she attributes to blood pressure medication adjustments. She reports sleeping through the night.  She currently denies any suicidality, homicidality or perceptual disturbances.   Visit Diagnosis:    ICD-10-CM   1. GAD (generalized anxiety disorder)  F41.1     2. Other specified depressive episodes  F32.89    Depression episode with insufficient symptoms    3. Insomnia due to medical condition  G47.01    Mood, headache    4. Long-term current use of benzodiazepine  Z79.899       Past Psychiatric History: I have reviewed past psychiatric history from progress note on 02/02/2022.  Past trials of medications like Celexa , Xanax , Ambien , Lamictal , Propranolol , Vistaril .  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   Cardiac arrhythmia    Nonspecific ST T wave changes on EKG   Chronic venous insufficiency  of lower extremity    Complication of anesthesia    nausea and vomiting   DDD (degenerative disc disease), lumbosacral    Essential hypertension    Headache    History of kidney stones    Hyperlipidemia    Kidney stones    Lymphedema    Migraines    Osteoporosis    PONV (postoperative nausea and vomiting)    Right ureteral stone    Vitamin B12 deficiency    Vitamin D deficiency     Past Surgical History:  Procedure Laterality Date   AUGMENTATION MAMMAPLASTY     CESAREAN SECTION     x 4   COLONOSCOPY WITH PROPOFOL  N/A 03/31/2022   Procedure: COLONOSCOPY WITH PROPOFOL ;  Surgeon: Onita Elspeth Sharper, DO;  Location: [facility];  Service: Gastroenterology;  Laterality: N/A;   CYSTOSCOPY W/ RETROGRADES Bilateral 07/10/2020   Procedure: CYSTOSCOPY WITH RETROGRADE PYELOGRAM;  Surgeon: Francisca Redell BROCKS, MD;  Location: [facility];  Service: Urology;  Laterality: Bilateral;   CYSTOSCOPY/URETEROSCOPY/HOLMIUM LASER/STENT PLACEMENT     CYSTOSCOPY/URETEROSCOPY/HOLMIUM LASER/STENT PLACEMENT Bilateral 07/10/2020   Procedure: CYSTOSCOPY/URETEROSCOPY/HOLMIUM LASER/STENT PLACEMENT;  Surgeon: Francisca Redell BROCKS, MD;  Location: [facility];  Service: Urology;  Laterality: Bilateral;   CYSTOSCOPY/URETEROSCOPY/HOLMIUM LASER/STENT PLACEMENT Right 08/25/2023   Procedure: CYSTOSCOPY/URETEROSCOPY/HOLMIUM LASER;  Surgeon: Francisca Redell BROCKS, MD;  Location: [facility];  Service: Urology;  Laterality: Right;   CYSTOSCOPY/URETEROSCOPY/HOLMIUM LASER/STENT PLACEMENT Right 12/15/2023   Procedure: CYSTOSCOPY/URETEROSCOPY/HOLMIUM LASER;  Surgeon: Francisca Redell BROCKS, MD;  Location: [facility];  Service: Urology;  Laterality: Right;   EXTRACORPOREAL SHOCK WAVE LITHOTRIPSY     x 10 plus   Eye Lift     FACIAL COSMETIC SURGERY     GANGLION CYST EXCISION Right 12/21/2021   Procedure: REMOVAL GANGLION OF WRIST;  Surgeon: Kathlynn Sharper, MD;  Location: [facility];  Service: Orthopedics;  Laterality: Right;   LIPOSUCTION      TONSILLECTOMY     TRIGGER FINGER RELEASE Right 12/21/2021   Procedure: RELEASE TRIGGER FINGER/A-1 PULLEY;  Surgeon: Kathlynn Sharper, MD;  Location: [facility];  Service: Orthopedics;  Laterality: Right;   URETEROSCOPY WITH HOLMIUM LASER LITHOTRIPSY      Family Psychiatric History: I have reviewed family psychiatric history from progress note on 02/02/2022.  Family History:  Family History  Problem Relation Age of Onset   Drug abuse Daughter    Depression Daughter    Depression Son    Bladder Cancer Neg Hx    Kidney cancer Neg Hx    Prostate cancer Neg Hx     Social History: I have reviewed social history from progress note on 02/02/2022. Social History   Socioeconomic History   Marital status: Married    Spouse name: Lynwood   Number of children: 4   Years of education: Not on file   Highest education level: High school graduate  Occupational History   Not on file  Tobacco Use   Smoking status: Never    Passive exposure: Never   Smokeless tobacco: Never  Vaping Use   Vaping status: Never Used  Substance and Sexual Activity   Alcohol use: Not Currently   Drug use: Not Currently   Sexual activity: Yes  Other Topics Concern   Not on file  Social  History Narrative   Not on file   Social Drivers of Health   Financial Resource Strain: Patient Declined (01/16/2024)   Received from [facility]   Overall Financial Resource Strain (CARDIA)    Difficulty of Paying Living Expenses: Patient declined  Food Insecurity: Patient Declined (01/16/2024)   Received from [facility]   Hunger Vital Sign    Within the past 12 months, you worried that your food would run out before you got the money to buy more.: Patient declined    Within the past 12 months, the food you bought just didn't last and you didn't have money to get more.: Patient declined  Transportation Needs: Patient Declined (01/16/2024)   Received from [facility] -  Transportation    In the past 12 months, has lack of transportation kept you from medical appointments or from getting medications?: Patient declined    Lack of Transportation (Non-Medical): Patient declined  Physical Activity: Not on file  Stress: Not on file  Social Connections: Moderately Integrated (12/19/2023)   Social Connection and Isolation Panel    Frequency of Communication with Friends and Family: More than three times a week    Frequency of Social Gatherings with Friends and Family: Once a week    Attends Religious Services: Never    Database administrator or Organizations: Yes    Attends Engineer, structural: More than 4 times per year    Marital Status: Married    Allergies:  Allergies  Allergen Reactions   Percocet [Oxycodone -Acetaminophen ] Itching    Pt tolerates both oxycodone  and tylenol  individually     Metabolic Disorder Labs: Lab Results  Component Value Date   HGBA1C 4.6 (L) 12/18/2023   MPG 85.32 12/18/2023   No results found for: PROLACTIN Lab Results  Component Value Date   CHOL 235 (H) 12/18/2023   TRIG 248 (H) 12/18/2023   HDL 50 12/18/2023   CHOLHDL 4.7 12/18/2023   VLDL 50 (H) 12/18/2023   LDLCALC 135 (H) 12/18/2023   Lab Results  Component Value Date   TSH 1.455 12/18/2023   TSH 2.743 03/08/2022    Therapeutic Level Labs: No results found for: LITHIUM No results found for: VALPROATE No results found for: CBMZ  Current Medications: Current Outpatient Medications  Medication Sig Dispense Refill   amitriptyline  (ELAVIL ) 75 MG tablet Take 75 mg by mouth at bedtime.     Fremanezumab-vfrm (AJOVY) 225 MG/1.5ML SOAJ Inject 1.5 mLs into the skin.     nitrofurantoin, macrocrystal-monohydrate, (MACROBID) 100 MG capsule Take 100 mg by mouth 2 (two) times daily.     aspirin  EC 81 MG tablet Take 1 tablet (81 mg total) by mouth daily. Swallow whole. 30 tablet 12   atorvastatin  (LIPITOR) 40 MG tablet Take 1 tablet (40 mg total) by  mouth daily. 90 tablet 0   Biotin 10 MG CAPS Take by mouth.     lisinopril  (ZESTRIL ) 10 MG tablet Take 1 tablet (10 mg total) by mouth daily. 30 tablet 0   LORazepam  (ATIVAN ) 0.5 MG tablet Take 1 tablet (0.5 mg total) by mouth as directed. Take 1 tablet up to 3-4 times a week only for severe anxiety attacks,pls limit use, 26 tablets must last 30 days 26 tablet 2   OnabotulinumtoxinA (BOTOX IM) Inject 1 Dose into the muscle every 3 (three) months.     ondansetron  (ZOFRAN -ODT) 8 MG disintegrating tablet Take 8 mg by mouth 3 (three) times  daily.     zolpidem  (AMBIEN ) 10 MG tablet Take 1 tablet (10 mg total) by mouth at bedtime. 30 tablet 3   No current facility-administered medications for this visit.     Musculoskeletal: Strength & Muscle Tone: UTA Gait & Station: Seated Patient leans: N/A  Psychiatric Specialty Exam: Review of Systems  Psychiatric/Behavioral:  The patient is nervous/anxious.     There were no vitals taken for this visit.There is no height or weight on file to calculate BMI.  General Appearance: Casual  Eye Contact:  Fair  Speech:  Clear and Coherent  Volume:  Normal  Mood:  Anxious  Affect:  Congruent  Thought Process:  Goal Directed and Descriptions of Associations: Intact  Orientation:  Full (Time, Place, and Person)  Thought Content: Logical   Suicidal Thoughts:  No  Homicidal Thoughts:  No  Memory:  Immediate;   Fair Recent;   Fair Remote;   Fair  Judgement:  Fair  Insight:  Fair  Psychomotor Activity:  Normal  Concentration:  Concentration: Fair and Attention Span: Fair  Recall:  [employer] of Knowledge: Fair  Language: Fair  Akathisia:  No  Handed:  Right  AIMS (if indicated): not done  Assets:  Communication Skills Desire for Improvement Housing Social Support Transportation  ADL's:  Intact  Cognition: WNL  Sleep:  Fair   Screenings: GAD-7    Flowsheet Row Office Visit from 04/03/2023 in [facility] Office Visit from 07/25/2022 in [facility] Office Visit from 04/25/2022 in [facility] Office Visit from 03/14/2022 in [facility] Office Visit from 02/02/2022 in [facility]  Total GAD-7 Score 18 0 5 20 18    PHQ2-9    Flowsheet Row Office Visit from 04/03/2023 in [facility] Office Visit from 07/25/2022 in [facility] Office Visit from 04/25/2022 in [facility] Office Visit from 02/02/2022 in [facility]  PHQ-2 Total Score 0 0 0 3  PHQ-9 Total Score -- 1 2 13    Flowsheet Row Video Visit from 01/18/2024 in [facility] ED from 01/01/2024 in [facility] ED to Hosp-Admission (Discharged) from 12/18/2023 in [facility] 1C MEDICAL TELEMETRY  C-SSRS RISK CATEGORY No Risk No Risk No Risk     Assessment and Plan: Ashley Collins is a 63 year old Caucasian female on SSI, married, lives in [place], has a history of depression, anxiety, migraine headaches was evaluated by telemedicine today.  Discussed assessment and plan as noted below.   Generalized anxiety disorder-improving Currently although with anxiety regarding her recent CVA as well as visual and physical limitations from the same she has been overall keeping a positive attitude.  She does use lorazepam  and is currently trying to limit use.  Motivated to start psychotherapy sessions. Continue Lorazepam  0.5 mg as needed for severe anxiety attacks only Reviewed [place] PMP AWARxE Provided resources for therapist in the community.  Patient to establish care.  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 Discontinue Lamictal  currently not compliant. Encouraged to establish care with therapist.  Insomnia-stable Currently reports overall sleep is good on the Ambien . Continue sleep  hygiene techniques Continue Ambien  10 mg at bedtime Reviewed [place] PMP AWARxE   Follow-up Follow-up in clinic in 1 month or sooner if needed.  Collaboration of Care: Collaboration of Care: Referral or follow-up with counselor/therapist AEB encouraged to establish care with therapist.  I have reviewed notes per Dr. Maree neurology 01/04/24 as noted above.  Patient/Guardian was advised Release of Information must be obtained prior to any record release in order to collaborate their care with an outside provider. Patient/Guardian was advised if they have not already done so to contact the registration department to sign all necessary forms in order for us  to release information regarding their care.   Consent: Patient/Guardian gives verbal consent for treatment and assignment of benefits for services provided during this visit. Patient/Guardian expressed understanding and agreed to proceed.  This note was generated in part or whole with voice recognition software. Voice recognition is usually quite accurate but there are transcription errors that can and very often do occur. I apologize for any typographical errors that were not detected and corrected.     Morey Andonian, MD 01/19/2024, 7:04 AM

## 2024-01-18 NOTE — Patient Instructions (Signed)
  [email or web address]  www.[email or web address]  [email or web address] [facility] [address], [place], [place] [address]   [phone]  [facility] [email or web address] [address] [place], [place] [address]  [phone]  [facility] [email or web address] [address], [place], [place] [address]  [phone]   [facility] - [phone]  [facility] - [phone]  [facility] counseling - [phone] counseling [phone]  [facility] - [phone]   [facility] [address] [place], [place] [address]  [phone]    Bel[facility] Chancy [address] [place], [place] [address]  [phone]    [facility] [address] [place], [place] [address]  [phone]

## 2024-01-22 ENCOUNTER — Ambulatory Visit: Admitting: Occupational Therapy

## 2024-01-22 DIAGNOSIS — M6281 Muscle weakness (generalized): Secondary | ICD-10-CM

## 2024-01-22 DIAGNOSIS — H547 Unspecified visual loss: Secondary | ICD-10-CM | POA: Diagnosis not present

## 2024-01-22 NOTE — Therapy (Addendum)
 OUTPATIENT OCCUPATIONAL THERAPY NEURO TREATMENT  NOTE  Patient Name: Ashley Collins MRN: [id number] DOB:1961/06/15, 63 y.o., female Today's Date: 01/22/2024  PCP: Harvey Gaetana CROME, NP REFERRING PROVIDER: Harvey Gaetana CROME, NP   OT End of Session - 01/22/24 1459     Visit Number 8    Number of Visits 24    Date for OT Re-Evaluation 03/20/24    OT Start Time 1317    OT Stop Time 1400    OT Time Calculation (min) 43 min    Activity Tolerance Patient tolerated treatment well    Behavior During Therapy WFL for tasks assessed/performed          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   Cardiac arrhythmia    Nonspecific ST T wave changes on EKG   Chronic venous insufficiency of lower extremity    Complication of anesthesia    nausea and vomiting   DDD (degenerative disc disease), lumbosacral    Essential hypertension    Headache    History of kidney stones    Hyperlipidemia    Kidney stones    Lymphedema    Migraines    Osteoporosis    PONV (postoperative nausea and vomiting)    Right ureteral stone    Vitamin B12 deficiency    Vitamin D deficiency    Past Surgical History:  Procedure Laterality Date   AUGMENTATION MAMMAPLASTY     CESAREAN SECTION     x 4   COLONOSCOPY WITH PROPOFOL  N/A 03/31/2022   Procedure: COLONOSCOPY WITH PROPOFOL ;  Surgeon: Onita Elspeth Sharper, DO;  Location: [facility] ENDOSCOPY;  Service: Gastroenterology;  Laterality: N/A;   CYSTOSCOPY W/ RETROGRADES Bilateral 07/10/2020   Procedure: CYSTOSCOPY WITH RETROGRADE PYELOGRAM;  Surgeon: Francisca Redell BROCKS, MD;  Location: [facility] ORS;  Service: Urology;  Laterality: Bilateral;   CYSTOSCOPY/URETEROSCOPY/HOLMIUM LASER/STENT PLACEMENT     CYSTOSCOPY/URETEROSCOPY/HOLMIUM LASER/STENT PLACEMENT Bilateral 07/10/2020   Procedure: CYSTOSCOPY/URETEROSCOPY/HOLMIUM LASER/STENT PLACEMENT;  Surgeon: Francisca Redell BROCKS, MD;  Location: [facility] ORS;  Service:  Urology;  Laterality: Bilateral;   CYSTOSCOPY/URETEROSCOPY/HOLMIUM LASER/STENT PLACEMENT Right 08/25/2023   Procedure: CYSTOSCOPY/URETEROSCOPY/HOLMIUM LASER;  Surgeon: Francisca Redell BROCKS, MD;  Location: [facility] ORS;  Service: Urology;  Laterality: Right;   CYSTOSCOPY/URETEROSCOPY/HOLMIUM LASER/STENT PLACEMENT Right 12/15/2023   Procedure: CYSTOSCOPY/URETEROSCOPY/HOLMIUM LASER;  Surgeon: Francisca Redell BROCKS, MD;  Location: [facility] ORS;  Service: Urology;  Laterality: Right;   EXTRACORPOREAL SHOCK WAVE LITHOTRIPSY     x 10 plus   Eye Lift     FACIAL COSMETIC SURGERY     GANGLION CYST EXCISION Right 12/21/2021   Procedure: REMOVAL GANGLION OF WRIST;  Surgeon: Kathlynn Sharper, MD;  Location: [facility] ORS;  Service: Orthopedics;  Laterality: Right;   LIPOSUCTION     TONSILLECTOMY     TRIGGER FINGER RELEASE Right 12/21/2021   Procedure: RELEASE TRIGGER FINGER/A-1 PULLEY;  Surgeon: Kathlynn Sharper, MD;  Location: [facility] ORS;  Service: Orthopedics;  Laterality: Right;   URETEROSCOPY WITH HOLMIUM LASER LITHOTRIPSY     Patient Active Problem List   Diagnosis Date Noted   Acute CVA (cerebrovascular accident) (HCC) 12/19/2023   Hypokalemia 12/19/2023   AKI (acute kidney injury) (HCC) 12/19/2023   Leukocytosis 12/19/2023   Hyperlipidemia, unspecified 12/19/2023   Essential hypertension 12/19/2023   Vision changes 12/19/2023   Lymphedema 05/22/2023   Chronic venous insufficiency 05/22/2023   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 replacement therapy 04/10/2023   Insomnia due to  medical condition 04/25/2022   GAD (generalized anxiety disorder) 02/02/2022   Other specified depressive episodes 02/02/2022   Long-term current use of benzodiazepine 02/02/2022   Migraine with aura and without status migrainosus, not intractable 11/03/2020   Arrhythmia 08/06/2020   Status migrainosus 02/25/2019   Osteoporosis 04/09/2013   Vitamin D deficiency 04/09/2013   DDD (degenerative disc disease), lumbosacral 02/08/2005   ONSET DATE:  12/18/2023  REFERRING DIAG:   THERAPY DIAG:  Muscle weakness (generalized)  Rationale for Evaluation and Treatment: Rehabilitation  SUBJECTIVE:  SUBJECTIVE STATEMENT: Pt reports having to get her new glass prescriptions modified. Pt accompanied by: significant other  PERTINENT HISTORY: Pt. Has Hx of stroke with onset 12/18/2023. Pt. PMHx includes: HTN, Hyperlipidemia, Kidney Stones, near syncopal event, loss of vision.  PRECAUTIONS: None  WEIGHT BEARING RESTRICTIONS:  PAIN:  Are you having pain? No  FALLS: Has patient fallen in last 6 months? Yes. Number of falls    LIVING ENVIRONMENT: Lives with: lives with their family Lives in: [place], [place] Stairs: No, inside the house, yes but does not use Has following equipment at home:   PLOF: Independent  PATIENT GOALS: To be able to see  OBJECTIVE:  Note: Objective measures were completed at Evaluation unless otherwise noted.  HAND DOMINANCE: Right  ADLs:  Eating: Drinking from straw is different, able to use utensils.  Grooming: Fatigues completing hair care.  UB Dressing: independent, if clothes are in front of her she can find clothing LB Dressing: Independent Toileting: Independent Bathing: Independent Tub Shower transfers: Independent Equipment: none Has difficulty with using cell phone  IADLs: Shopping: Does not typically go out shopping, and has not tried to. Light housekeeping: Pt. reports that she does not typically do house cleaning. Pt. Has been able to unpack belongings from boxes due to her recent move in. Meal Prep: Pt. reports that is does not currently cook, however reports that she probably could. Has difficulty opening bottle caps/lids. Community mobility: Requires assistance from husband to navigate  through buildings, negotiate stairs-Pt. With increased fear of falling  Medication management: Able to push down medication bottles to open  Financial management: No change in the process-uses automatic  bill pay system.  Has difficulty with using cell phone Handwriting: N/T Work: Pt. was actively working managing a Health visitor, works a lot of hours  MOBILITY STATUS: Independent and Needs Assist: Requires hand on hand assistance with nvigating through environments.   POSTURE COMMENTS:  No Significant postural limitations Sitting balance: Good  ACTIVITY TOLERANCE: Activity tolerance: Good  FUNCTIONAL OUTCOME MEASURES:   UPPER EXTREMITY ROM:    Active ROM Right Eval WFL Left Eval [facility]  Shoulder flexion    Shoulder abduction    Shoulder adduction    Shoulder extension    Shoulder internal rotation    Shoulder external rotation    Elbow flexion    Elbow extension    Wrist flexion    Wrist extension    Wrist ulnar deviation    Wrist radial deviation    Wrist pronation    Wrist supination    (Blank rows = not tested)  UPPER EXTREMITY MMT:     MMT Right eval Left eval  Shoulder flexion 4-/5 4-/5  Shoulder abduction 4-/5 4-/5  Shoulder adduction    Shoulder extension    Shoulder internal rotation    Shoulder external rotation    Middle trapezius    Lower trapezius    Elbow flexion 5/5 5/5  Elbow extension 5/5 5/5  Wrist flexion 4/5 4-/5  Wrist extension 4/5 4-/5  Wrist ulnar deviation    Wrist radial deviation    Wrist pronation    Wrist supination    (Blank rows = not tested)  HAND FUNCTION: Grip strength: Right: 39 lbs; Left: 28 lbs, Lateral pinch: Right: 11 lbs, Left: 9 lbs, and 3 point pinch: Right: 9 lbs, Left: 9 lbs  COORDINATION: 9 Hole Peg test: Right: 30 sec; Left: 36 sec  SENSATION: WFL  EDEMA:   MUSCLE TONE:   COGNITION: Overall cognitive status: Within functional limits for tasks assessed  VISION: Subjective report: Pt. Reports changes in her vision at onset of stroke, starting with blurry vision, resulting in vision loss in the R eye.  Baseline vision: Pt. Is able to visually track  Visual history: Hx of eye  surgery  VISION ASSESSMENT: TBD  PERCEPTION: WFL  PRAXIS: WFL  OBSERVATIONS:                                                                                                                    TREATMENT DATE: 01/22/2024   Therapeutic Activities:  -Facilitated visual scanning tasks while standing from the far left to the far right between 2 columns of 17 items on a vertical whiteboard with 75% accuracy requiring minimal cuing. -Pt. performed tabletop visual scanning, visual search strategies, and visual memory tasks identifying 10 pairs of 10 (same number & same color). Pt. was able to accurately locate 7/10 of the pairs requiring less cues, and less time to complete. -Pt. worked on visual saccades alternating between lists of letters from left to right at 6 then 12, shoulder width apart, followed by 24 , and 32 -Pt. was able to it accurately identify 77/78 underlined letters on a 13x24 letter grid.    PATIENT EDUCATION: Education details: Condition management/visual compensation strategies Person educated: Patient and Spouse Education method: Explanation and Verbal cues Education comprehension: verbalized understanding and returned demonstration  HOME EXERCISE PROGRAM:  -visual scanning tasks at the tabletop. -Visual memory tasks using playing cards, and gradually increasing the number of pairs.   GOALS: Goals reviewed with patient? Yes  SHORT TERM GOALS: Target date: 02/06/2024    Pt. Will independently utilize HEP for hand strength, coordination, and visual compensatory strategies ADL/ADLs Baseline: Eval: No  current HEP  Goal status: INITIAL   LONG TERM GOALS: Target date: 03/19/2024    Pt. Will be able to independently implement visual scanning/visual search compensatory strategies for tasks within her extra personal space navigating through community environments 100% of the time.  Baseline: Eval: Pt. Requires increased assist to navigate through community  environments. Goal status: INITIAL  2.  Pt. Will be able to independently utilize visual scanning/visual search strategies  during ADLS/IADL within her near space, and during tabletop top tasks. 100% of the time.  Baseline: Eval: Pt. Education to be provided.  Goal status: INITIAL  3.  Pt. Will be able to independently initiate visual scanning techniques in her own environment to reduce risk  of falls.  Baseline: Eval: Pt. Education to be provided.  Goal status: INITIAL  4.  Pt. Will increase BUE Grip Strength by 5# to be able to independently hold objects for ADL/IADL use. Baseline: Eval: Right Grip: 39#, Left Grip: 28# Goal status: INITIAL  5.  Pt. Will increase L Lateral Pinch strength by 2# to be able to independently twist off bottle caps/lids. Baseline:  Goal status: INITIAL  6.  Pt. Will increase BUE strength for shoulder flexion/abduction by 2 mm grades to independently complete hair care tasks. Baseline: Eval: Right Shoulder Flexion: 4-/5, Left Shoulder Flexion:4-/5, Right Shoulder Abduction: 4-/5, Left Shoulder Abduction: 4-/5 Goal status: INITIAL  7.  Pt will be indep with medication set up using weekly pill organizer.  Baseline: 01/15/24: Daughter currently manages medication set up.  Goal status: New  8. Pt will increase typing speed to 20-30 words per minute with at least 90% accuracy to work towards more efficient typing for job related  responsibilities.  Baseline: 01/15/24: 8wpm x88% accuracy=7 wpm  Goal status: New  9. Pt will be able to scan small print on store receipts to check for errors with 100% accuracy using visual compensation strategies as needed.  Baseline: 01/15/24: Not yet attempted; required for return to work/job responsibility  Goal status: New  ASSESSMENT: CLINICAL IMPRESSION:  Pt. reports that went back to have to the prescription for her glasses reassessed and changed over this past week. Pt. has one pair of glasses for close/reading tasks, and  one pair of glasses for distance. Pt. completed the visual scanning tasks standing at the vertical whiteboard scanning to the far left, and the far right with 75% accuracy, with minimal cuing. Pt. was able to accurately locate, and match 7/10 of the visual memory card pairs, improved from 1/8 last session. Pt. did not require the use of an anchor today during the tabletop visual scanning tasks. Pt. continues to benefit from Occupational Therapy services to improve her ability to use visual compensatory strategies, and improve overall BUE functioning in order to improve engagement in, and maximize overall independence with ADL, and IADL tasks.  PERFORMANCE DEFICITS: in functional skills including ADLs, IADLs, coordination, dexterity, Fine motor control, and vision, and psychosocial skills including coping strategies, environmental adaptation, habits, interpersonal interactions, and routines and behaviors.   IMPAIRMENTS: are limiting patient from ADLs, IADLs, rest and sleep, work, leisure, and social participation.   CO-MORBIDITIES: may have co-morbidities  that affects occupational performance. Patient will benefit from skilled OT to address above impairments and improve overall function.  MODIFICATION OR ASSISTANCE TO COMPLETE EVALUATION: Min-Moderate modification of tasks or assist with assess necessary to complete an evaluation.  OT OCCUPATIONAL PROFILE AND HISTORY: Detailed assessment: Review of records and additional review of physical, cognitive, psychosocial history related to current functional performance.  CLINICAL DECISION MAKING: Moderate - several treatment options, min-mod task modification necessary  REHAB POTENTIAL: Good  EVALUATION COMPLEXITY: Moderate    PLAN:  OT FREQUENCY: 2x/week  OT DURATION: 12 weeks  PLANNED INTERVENTIONS: 97168 OT Re-evaluation, 97535 self care/ADL training, 02889 therapeutic exercise, 97530 therapeutic activity, 97112 neuromuscular re-education,  visual/perceptual remediation/compensation, patient/family education, and DME and/or AE instructions  RECOMMENDED OTHER SERVICES: ST  CONSULTED AND AGREED WITH PLAN OF CARE: Patient and family member/caregiver  PLAN FOR NEXT SESSION: Treatment  Richardson Otter, MS, OTR/L   01/22/2024, 3:06 PM

## 2024-01-24 ENCOUNTER — Ambulatory Visit: Admitting: Occupational Therapy

## 2024-01-24 DIAGNOSIS — H543 Unqualified visual loss, both eyes: Secondary | ICD-10-CM

## 2024-01-24 DIAGNOSIS — H547 Unspecified visual loss: Secondary | ICD-10-CM

## 2024-01-25 NOTE — Therapy (Addendum)
 OUTPATIENT OCCUPATIONAL THERAPY NEURO TREATMENT  NOTE  Patient Name: Ashley Collins MRN: [id number] DOB:04/14/1961, 63 y.o., female Today's Date: 01/25/2024  PCP: Harvey Gaetana CROME, NP REFERRING PROVIDER: Harvey Gaetana CROME, NP   OT End of Session - 01/25/24 1531     Visit Number 9    Number of Visits 24    Date for OT Re-Evaluation 03/20/24    OT Start Time 1315    OT Stop Time 1400    OT Time Calculation (min) 45 min    Activity Tolerance Patient tolerated treatment well    Behavior During Therapy WFL for tasks assessed/performed           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   Cardiac arrhythmia    Nonspecific ST T wave changes on EKG   Chronic venous insufficiency of lower extremity    Complication of anesthesia    nausea and vomiting   DDD (degenerative disc disease), lumbosacral    Essential hypertension    Headache    History of kidney stones    Hyperlipidemia    Kidney stones    Lymphedema    Migraines    Osteoporosis    PONV (postoperative nausea and vomiting)    Right ureteral stone    Vitamin B12 deficiency    Vitamin D deficiency    Past Surgical History:  Procedure Laterality Date   AUGMENTATION MAMMAPLASTY     CESAREAN SECTION     x 4   COLONOSCOPY WITH PROPOFOL  N/A 03/31/2022   Procedure: COLONOSCOPY WITH PROPOFOL ;  Surgeon: Onita Elspeth Sharper, DO;  Location: [facility] ENDOSCOPY;  Service: Gastroenterology;  Laterality: N/A;   CYSTOSCOPY W/ RETROGRADES Bilateral 07/10/2020   Procedure: CYSTOSCOPY WITH RETROGRADE PYELOGRAM;  Surgeon: Francisca Redell BROCKS, MD;  Location: [facility] ORS;  Service: Urology;  Laterality: Bilateral;   CYSTOSCOPY/URETEROSCOPY/HOLMIUM LASER/STENT PLACEMENT     CYSTOSCOPY/URETEROSCOPY/HOLMIUM LASER/STENT PLACEMENT Bilateral 07/10/2020   Procedure: CYSTOSCOPY/URETEROSCOPY/HOLMIUM LASER/STENT PLACEMENT;  Surgeon: Francisca Redell BROCKS, MD;  Location: [facility] ORS;  Service:  Urology;  Laterality: Bilateral;   CYSTOSCOPY/URETEROSCOPY/HOLMIUM LASER/STENT PLACEMENT Right 08/25/2023   Procedure: CYSTOSCOPY/URETEROSCOPY/HOLMIUM LASER;  Surgeon: Francisca Redell BROCKS, MD;  Location: [facility] ORS;  Service: Urology;  Laterality: Right;   CYSTOSCOPY/URETEROSCOPY/HOLMIUM LASER/STENT PLACEMENT Right 12/15/2023   Procedure: CYSTOSCOPY/URETEROSCOPY/HOLMIUM LASER;  Surgeon: Francisca Redell BROCKS, MD;  Location: [facility] ORS;  Service: Urology;  Laterality: Right;   EXTRACORPOREAL SHOCK WAVE LITHOTRIPSY     x 10 plus   Eye Lift     FACIAL COSMETIC SURGERY     GANGLION CYST EXCISION Right 12/21/2021   Procedure: REMOVAL GANGLION OF WRIST;  Surgeon: Kathlynn Sharper, MD;  Location: [facility] ORS;  Service: Orthopedics;  Laterality: Right;   LIPOSUCTION     TONSILLECTOMY     TRIGGER FINGER RELEASE Right 12/21/2021   Procedure: RELEASE TRIGGER FINGER/A-1 PULLEY;  Surgeon: Kathlynn Sharper, MD;  Location: [facility] ORS;  Service: Orthopedics;  Laterality: Right;   URETEROSCOPY WITH HOLMIUM LASER LITHOTRIPSY     Patient Active Problem List   Diagnosis Date Noted   Acute CVA (cerebrovascular accident) (HCC) 12/19/2023   Hypokalemia 12/19/2023   AKI (acute kidney injury) (HCC) 12/19/2023   Leukocytosis 12/19/2023   Hyperlipidemia, unspecified 12/19/2023   Essential hypertension 12/19/2023   Vision changes 12/19/2023   Lymphedema 05/22/2023   Chronic venous insufficiency 05/22/2023   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 replacement therapy 04/10/2023   Insomnia due  to medical condition 04/25/2022   GAD (generalized anxiety disorder) 02/02/2022   Other specified depressive episodes 02/02/2022   Long-term current use of benzodiazepine 02/02/2022   Migraine with aura and without status migrainosus, not intractable 11/03/2020   Arrhythmia 08/06/2020   Status migrainosus 02/25/2019   Osteoporosis 04/09/2013   Vitamin D deficiency 04/09/2013   DDD (degenerative disc disease), lumbosacral 02/08/2005   ONSET DATE:  12/18/2023  REFERRING DIAG:   THERAPY DIAG:  Low vision, both eyes  Visual impairment  Rationale for Evaluation and Treatment: Rehabilitation  SUBJECTIVE:  SUBJECTIVE STATEMENT: Pt reports that she has 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  Pt accompanied by: significant other  PERTINENT HISTORY: Pt. Has Hx of stroke with onset 12/18/2023. Pt. PMHx includes: HTN, Hyperlipidemia, Kidney Stones, near syncopal event, loss of vision.  PRECAUTIONS: None  WEIGHT BEARING RESTRICTIONS:  PAIN:  Are you having pain? No  FALLS: Has patient fallen in last 6 months? Yes. Number of falls    LIVING ENVIRONMENT: Lives with: lives with their family Lives in: [place], [place] Stairs: No, inside the house, yes but does not use Has following equipment at home:   PLOF: Independent  PATIENT GOALS: To be able to see  OBJECTIVE:  Note: Objective measures were completed at Evaluation unless otherwise noted.  HAND DOMINANCE: Right  ADLs:  Eating: Drinking from straw is different, able to use utensils.  Grooming: Fatigues completing hair care.  UB Dressing: independent, if clothes are in front of her she can find clothing LB Dressing: Independent Toileting: Independent Bathing: Independent Tub Shower transfers: Independent Equipment: none Has difficulty with using cell phone  IADLs: Shopping: Does not typically go out shopping, and has not tried to. Light housekeeping: Pt. reports that she does not typically do house cleaning. Pt. Has been able to unpack belongings from boxes due to her recent move in. Meal Prep: Pt. reports that is does not currently cook, however reports that she probably could. Has difficulty opening bottle caps/lids. Community mobility: Requires assistance from husband to navigate  through buildings, negotiate stairs-Pt. With increased fear of falling  Medication management: Able to push down medication bottles to open  Financial  management: No change in the process-uses automatic bill pay system.  Has difficulty with using cell phone Handwriting: N/T Work: Pt. was actively working managing a Health visitor, works a lot of hours  MOBILITY STATUS: Independent and Needs Assist: Requires hand on hand assistance with nvigating through environments.   POSTURE COMMENTS:  No Significant postural limitations Sitting balance: Good  ACTIVITY TOLERANCE: Activity tolerance: Good  FUNCTIONAL OUTCOME MEASURES:   UPPER EXTREMITY ROM:    Active ROM Right Eval WFL Left Eval [facility]  Shoulder flexion    Shoulder abduction    Shoulder adduction    Shoulder extension    Shoulder internal rotation    Shoulder external rotation    Elbow flexion    Elbow extension    Wrist flexion    Wrist extension    Wrist ulnar deviation    Wrist radial deviation    Wrist pronation    Wrist supination    (Blank rows = not tested)  UPPER EXTREMITY MMT:     MMT Right eval Left eval  Shoulder flexion 4-/5 4-/5  Shoulder abduction 4-/5 4-/5  Shoulder adduction    Shoulder extension    Shoulder internal rotation    Shoulder external rotation    Middle trapezius    Lower trapezius  Elbow flexion 5/5 5/5  Elbow extension 5/5 5/5  Wrist flexion 4/5 4-/5  Wrist extension 4/5 4-/5  Wrist ulnar deviation    Wrist radial deviation    Wrist pronation    Wrist supination    (Blank rows = not tested)  HAND FUNCTION: Grip strength: Right: 39 lbs; Left: 28 lbs, Lateral pinch: Right: 11 lbs, Left: 9 lbs, and 3 point pinch: Right: 9 lbs, Left: 9 lbs  COORDINATION: 9 Hole Peg test: Right: 30 sec; Left: 36 sec  SENSATION: WFL  EDEMA:   MUSCLE TONE:   COGNITION: Overall cognitive status: Within functional limits for tasks assessed  VISION: Subjective report: Pt. Reports changes in her vision at onset of stroke, starting with blurry vision, resulting in vision loss in the R eye.  Baseline vision: Pt. Is able to  visually track  Visual history: Hx of eye surgery  VISION ASSESSMENT: TBD  PERCEPTION: WFL  PRAXIS: WFL  OBSERVATIONS:                                                                                                                    TREATMENT DATE: 01/24/2024   Therapeutic Activities:  -Pt. Performed visual scanning task while standing at vertically positioned whiteboard, scanning to the far right, to transcribe phone numbers on the L side of the vertical whiteboard. -Pt. Alternated transcribing numbers going from right to left, and left to right.  -To further challenge the task, task was graded up, using a random sequence of 9-10 numbers, scanning all the way to the right side of the whiteboard. -Facilitated visual memory tasks to remember a group of categorized words from display sheet. With the display sheet turned over, Pt. Attempted to visually remember as many words from the display sheet as possible.  -To grade the task down, Pt. Was told to remember as many words associated with the given category prior to flipping the display sheet over.  -Pt. Worked on Soil scientist tasks using [employer], remembering as many pictures from the selection as possible. -During the 1st trial, Pt. Was given instructions to find the correct 10 pictures that were on the displayed sheet, and for the 2nd trial, the pt. Was given instruction to find the correct 11 pictures that were displayed.     PATIENT EDUCATION: Education details: Condition management/visual compensation strategies Person educated: Patient and Spouse Education method: Explanation and Verbal cues Education comprehension: verbalized understanding and returned demonstration  HOME EXERCISE PROGRAM:  -visual scanning tasks at the tabletop. -Visual memory tasks using playing cards, and gradually increasing the number of pairs.   GOALS: Goals reviewed with patient? Yes  SHORT TERM GOALS: Target date: 02/06/2024    Pt. Will  independently utilize HEP for hand strength, coordination, and visual compensatory strategies ADL/ADLs Baseline: Eval: No  current HEP  Goal status: INITIAL   LONG TERM GOALS: Target date: 03/19/2024    Pt. Will be able to independently implement visual scanning/visual search compensatory strategies for tasks within her extra personal space  navigating through community environments 100% of the time.  Baseline: Eval: Pt. Requires increased assist to navigate through community environments. Goal status: INITIAL  2.  Pt. Will be able to independently utilize visual scanning/visual search strategies  during ADLS/IADL within her near space, and during tabletop top tasks. 100% of the time.  Baseline: Eval: Pt. Education to be provided.  Goal status: INITIAL  3.  Pt. Will be able to independently initiate visual scanning techniques in her own environment to reduce risk of falls.  Baseline: Eval: Pt. Education to be provided.  Goal status: INITIAL  4.  Pt. Will increase BUE Grip Strength by 5# to be able to independently hold objects for ADL/IADL use. Baseline: Eval: Right Grip: 39#, Left Grip: 28# Goal status: INITIAL  5.  Pt. Will increase L Lateral Pinch strength by 2# to be able to independently twist off bottle caps/lids. Baseline:  Goal status: INITIAL  6.  Pt. Will increase BUE strength for shoulder flexion/abduction by 2 mm grades to independently complete hair care tasks. Baseline: Eval: Right Shoulder Flexion: 4-/5, Left Shoulder Flexion:4-/5, Right Shoulder Abduction: 4-/5, Left Shoulder Abduction: 4-/5 Goal status: INITIAL  7.  Pt will be indep with medication set up using weekly pill organizer.  Baseline: 01/15/24: Daughter currently manages medication set up.  Goal status: New  8. Pt will increase typing speed to 20-30 words per minute with at least 90% accuracy to work towards more efficient typing for job related  responsibilities.  Baseline: 01/15/24: 8wpm x88% accuracy=7  wpm  Goal status: New  9. Pt will be able to scan small print on store receipts to check for errors with 100% accuracy using visual compensation strategies as needed.  Baseline: 01/15/24: Not yet attempted; required for return to work/job responsibility  Goal status: New  ASSESSMENT: CLINICAL IMPRESSION:  Pt. Tolerated today's treatment session well today. Pt. Was able to tolerate visual scanning and visual memory tasks. Pt. Was able to utilize visual scanning techniques to scan the whiteboard from the right to the far left and then from the left to the far right, to successfully transcribe phone numbers on designated line placed at the opposite side of the whiteboard. The task was graded up so that the pt. Scanned from the L side of the whiteboard, all the way to the R side of the whiteboard. However, Pt. Omitted 6/10 randomized sequenced numbers, omitting 1 from each set of numbers. Pt. Completed 6 trials of visual memory tasks using a group of words to remember by category. Pt consistently remembered 6 words from the group of words displayed at tabletop surface. Pt. Provided education on visual compensation strategies for grouping and categorizing techniques to remember as many words as possible on the display sheet. Pt. Was able to recall more words after using this technique, and when provided a category. Pt. Successfully completed 2 trials of visual memory task using pictures, 1st trial displaying 10 pictures and 2nd trial displaying 11 pictures. Pt. Was able to recall all images from 1st trial, however, required 1 vc due to omission of one picture. During the 2nd trial, pt. Was able to recall all mages without cuing. Pt. continues to benefit from Occupational Therapy services to improve her ability to use visual compensatory strategies, and improve overall BUE functioning in order to improve engagement in, and maximize overall independence with ADL, and IADL tasks.  PERFORMANCE DEFICITS: in  functional skills including ADLs, IADLs, coordination, dexterity, Fine motor control, and vision, and psychosocial skills including coping  strategies, environmental adaptation, habits, interpersonal interactions, and routines and behaviors.   IMPAIRMENTS: are limiting patient from ADLs, IADLs, rest and sleep, work, leisure, and social participation.   CO-MORBIDITIES: may have co-morbidities  that affects occupational performance. Patient will benefit from skilled OT to address above impairments and improve overall function.  MODIFICATION OR ASSISTANCE TO COMPLETE EVALUATION: Min-Moderate modification of tasks or assist with assess necessary to complete an evaluation.  OT OCCUPATIONAL PROFILE AND HISTORY: Detailed assessment: Review of records and additional review of physical, cognitive, psychosocial history related to current functional performance.  CLINICAL DECISION MAKING: Moderate - several treatment options, min-mod task modification necessary  REHAB POTENTIAL: Good  EVALUATION COMPLEXITY: Moderate    PLAN:  OT FREQUENCY: 2x/week  OT DURATION: 12 weeks  PLANNED INTERVENTIONS: 97168 OT Re-evaluation, 97535 self care/ADL training, 02889 therapeutic exercise, 97530 therapeutic activity, 97112 neuromuscular re-education, visual/perceptual remediation/compensation, patient/family education, and DME and/or AE instructions  RECOMMENDED OTHER SERVICES: ST  CONSULTED AND AGREED WITH PLAN OF CARE: Patient and family member/caregiver  PLAN FOR NEXT SESSION: Treatment  Richardson Otter, MS, OTR/L   01/24/2024

## 2024-01-29 ENCOUNTER — Ambulatory Visit: Attending: Family Medicine | Admitting: Occupational Therapy

## 2024-01-29 DIAGNOSIS — H547 Unspecified visual loss: Secondary | ICD-10-CM | POA: Diagnosis present

## 2024-01-29 DIAGNOSIS — R278 Other lack of coordination: Secondary | ICD-10-CM | POA: Diagnosis present

## 2024-01-29 DIAGNOSIS — R41841 Cognitive communication deficit: Secondary | ICD-10-CM | POA: Diagnosis present

## 2024-01-29 DIAGNOSIS — R262 Difficulty in walking, not elsewhere classified: Secondary | ICD-10-CM | POA: Insufficient documentation

## 2024-01-29 DIAGNOSIS — M6281 Muscle weakness (generalized): Secondary | ICD-10-CM | POA: Insufficient documentation

## 2024-01-29 DIAGNOSIS — H543 Unqualified visual loss, both eyes: Secondary | ICD-10-CM | POA: Diagnosis present

## 2024-01-29 NOTE — Therapy (Signed)
 Occupational Therapy Progress Note  Dates of reporting period  12/26/23   to   01/29/24   Patient Name: Ashley Collins MRN: [id number] DOB:07-03-1960, 63 y.o., female Today's Date: 01/29/2024  PCP: Harvey Gaetana CROME, NP REFERRING PROVIDER: Harvey Gaetana CROME, NP   OT End of Session - 01/29/24 1554     Visit Number 10    Date for OT Re-Evaluation 03/20/24    OT Start Time 1147    OT Stop Time 1230    OT Time Calculation (min) 43 min    Activity Tolerance Patient tolerated treatment well    Behavior During Therapy WFL for tasks assessed/performed            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   Cardiac arrhythmia    Nonspecific ST T wave changes on EKG   Chronic venous insufficiency of lower extremity    Complication of anesthesia    nausea and vomiting   DDD (degenerative disc disease), lumbosacral    Essential hypertension    Headache    History of kidney stones    Hyperlipidemia    Kidney stones    Lymphedema    Migraines    Osteoporosis    PONV (postoperative nausea and vomiting)    Right ureteral stone    Vitamin B12 deficiency    Vitamin D deficiency    Past Surgical History:  Procedure Laterality Date   AUGMENTATION MAMMAPLASTY     CESAREAN SECTION     x 4   COLONOSCOPY WITH PROPOFOL  N/A 03/31/2022   Procedure: COLONOSCOPY WITH PROPOFOL ;  Surgeon: Onita Elspeth Sharper, DO;  Location: [facility];  Service: Gastroenterology;  Laterality: N/A;   CYSTOSCOPY W/ RETROGRADES Bilateral 07/10/2020   Procedure: CYSTOSCOPY WITH RETROGRADE PYELOGRAM;  Surgeon: Francisca Redell BROCKS, MD;  Location: [facility];  Service: Urology;  Laterality: Bilateral;   CYSTOSCOPY/URETEROSCOPY/HOLMIUM LASER/STENT PLACEMENT     CYSTOSCOPY/URETEROSCOPY/HOLMIUM LASER/STENT PLACEMENT Bilateral 07/10/2020   Procedure: CYSTOSCOPY/URETEROSCOPY/HOLMIUM LASER/STENT PLACEMENT;  Surgeon: Francisca Redell BROCKS, MD;  Location: [facility];   Service: Urology;  Laterality: Bilateral;   CYSTOSCOPY/URETEROSCOPY/HOLMIUM LASER/STENT PLACEMENT Right 08/25/2023   Procedure: CYSTOSCOPY/URETEROSCOPY/HOLMIUM LASER;  Surgeon: Francisca Redell BROCKS, MD;  Location: [facility];  Service: Urology;  Laterality: Right;   CYSTOSCOPY/URETEROSCOPY/HOLMIUM LASER/STENT PLACEMENT Right 12/15/2023   Procedure: CYSTOSCOPY/URETEROSCOPY/HOLMIUM LASER;  Surgeon: Francisca Redell BROCKS, MD;  Location: [facility];  Service: Urology;  Laterality: Right;   EXTRACORPOREAL SHOCK WAVE LITHOTRIPSY     x 10 plus   Eye Lift     FACIAL COSMETIC SURGERY     GANGLION CYST EXCISION Right 12/21/2021   Procedure: REMOVAL GANGLION OF WRIST;  Surgeon: Kathlynn Sharper, MD;  Location: [facility];  Service: Orthopedics;  Laterality: Right;   LIPOSUCTION     TONSILLECTOMY     TRIGGER FINGER RELEASE Right 12/21/2021   Procedure: RELEASE TRIGGER FINGER/A-1 PULLEY;  Surgeon: Kathlynn Sharper, MD;  Location: [facility];  Service: Orthopedics;  Laterality: Right;   URETEROSCOPY WITH HOLMIUM LASER LITHOTRIPSY     Patient Active Problem List   Diagnosis Date Noted   Acute CVA (cerebrovascular accident) (HCC) 12/19/2023   Hypokalemia 12/19/2023   AKI (acute kidney injury) (HCC) 12/19/2023   Leukocytosis 12/19/2023   Hyperlipidemia, unspecified 12/19/2023   Essential hypertension 12/19/2023   Vision changes 12/19/2023   Lymphedema 05/22/2023   Chronic venous insufficiency 05/22/2023   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 replacement therapy  04/10/2023   Insomnia due to medical condition 04/25/2022   GAD (generalized anxiety disorder) 02/02/2022   Other specified depressive episodes 02/02/2022   Long-term current use of benzodiazepine 02/02/2022   Migraine with aura and without status migrainosus, not intractable 11/03/2020   Arrhythmia 08/06/2020   Status migrainosus 02/25/2019   Osteoporosis 04/09/2013   Vitamin D deficiency 04/09/2013   DDD (degenerative disc disease), lumbosacral 02/08/2005   ONSET  DATE: 12/18/2023  REFERRING DIAG:   THERAPY DIAG:  Muscle weakness (generalized)  Low vision, both eyes  Other lack of coordination  Rationale for Evaluation and Treatment: Rehabilitation  SUBJECTIVE:  SUBJECTIVE STATEMENT: Pt. Reports that she is considering getting progressive lenses.  Pt accompanied by: significant other  PERTINENT HISTORY: Pt. has Hx of stroke with onset 12/18/2023. Pt. PMHx includes: HTN, Hyperlipidemia, Kidney Stones, near syncopal event, loss of vision.  PRECAUTIONS: None  WEIGHT BEARING RESTRICTIONS:  PAIN:  Are you having pain? No  FALLS: Has patient fallen in last 6 months? Yes. Number of falls    LIVING ENVIRONMENT: Lives with: lives with their family Lives in: [place], [place] Stairs: No, inside the house, yes but does not use Has following equipment at home:   PLOF: Independent  PATIENT GOALS: To be able to see  OBJECTIVE:  Note: Objective measures were completed at Evaluation unless otherwise noted.  HAND DOMINANCE: Right  ADLs:  Eating: Drinking from straw is different, able to use utensils.  Grooming: Fatigues completing hair care.  UB Dressing: independent, if clothes are in front of her she can find clothing LB Dressing: Independent Toileting: Independent Bathing: Independent Tub Shower transfers: Independent Equipment: none Has difficulty with using cell phone  IADLs: Shopping: Does not typically go out shopping, and has not tried to. Light housekeeping: Pt. reports that she does not typically do house cleaning. Pt. has been able to unpack belongings from boxes due to her recent move in. Meal Prep: Pt. reports that is does not currently cook, however reports that she probably could. Has difficulty opening bottle caps/lids. Community mobility: Requires assistance from husband to navigate  through buildings, negotiate stairs-Pt. With increased fear of falling  Medication management: Able to push down medication bottles to open   Financial management: No change in the process-uses automatic bill pay system.  Has difficulty with using cell phone Handwriting: N/T Work: Pt. was actively working managing a Health visitor, works a lot of hours  MOBILITY STATUS: Independent and Needs Assist: Requires hand on hand assistance with nvigating through environments.   POSTURE COMMENTS:  No Significant postural limitations Sitting balance: Good  ACTIVITY TOLERANCE: Activity tolerance: Good  FUNCTIONAL OUTCOME MEASURES:   UPPER EXTREMITY ROM:    Active ROM Right Eval WFL Left Eval [facility]  Shoulder flexion    Shoulder abduction    Shoulder adduction    Shoulder extension    Shoulder internal rotation    Shoulder external rotation    Elbow flexion    Elbow extension    Wrist flexion    Wrist extension    Wrist ulnar deviation    Wrist radial deviation    Wrist pronation    Wrist supination    (Blank rows = not tested)  UPPER EXTREMITY MMT:     MMT Right eval  Left eval   Shoulder flexion 4-/5  4-/5   Shoulder abduction 4-/5  4-/5   Shoulder adduction      Shoulder extension      Shoulder internal rotation  Shoulder external rotation      Middle trapezius      Lower trapezius      Elbow flexion 5/5  5/5   Elbow extension 5/5  5/5   Wrist flexion 4/5  4-/5   Wrist extension 4/5  4-/5   Wrist ulnar deviation      Wrist radial deviation      Wrist pronation      Wrist supination      (Blank rows = not tested)  HAND FUNCTION: Grip strength: Right: 39 lbs; Left: 28 lbs, Lateral pinch: Right: 11 lbs, Left: 9 lbs, and 3 point pinch: Right: 9 lbs, Left: 9 lbs  01/29/24 Grip strength: Right: 39 lbs; Left: 36 lbs, Lateral pinch: Right: 14 lbs, Left: 12 lbs, and 3 point pinch: Right: 12 lbs, Left: 11 lbs   COORDINATION: 9 Hole Peg test: Right: 30 sec; Left: 36 sec  01/29/24 9 Hole Peg test: Right: 24 sec; Left: 23 sec   SENSATION: WFL  EDEMA:   MUSCLE TONE:   COGNITION: Overall  cognitive status: Within functional limits for tasks assessed  VISION: Subjective report: Pt. Reports changes in her vision at onset of stroke, starting with blurry vision, resulting in vision loss in the R eye.  Baseline vision: Pt. Is able to visually track  Visual history: Hx of eye surgery  VISION ASSESSMENT: TBD  PERCEPTION: WFL  PRAXIS: WFL  OBSERVATIONS:                                                                                                                    TREATMENT DATE: 01/29/2024   Measurements were obtained, and goals were reviewed with the Pt.   Therapeutic Activities:   The BiV[employer] Visual Attention Assessment were administered to determine how his visual impairments may be affecting his ADL, and IADL functioning. SEARCH STRATEGIES FOR NEAR SPACE for tabletop tasks Structured visual Array Single letter search-simple Time: 1 min. & 14 sec. Omissions: 2 in the center  search strategy used: symmetrical horizontal  left to right Single letter search-crowded Time: 1 min. 43 sec. Omissions: 3 one each to the left, right, and center  Search strategy used: symmetrical horizontal left to right.  Word Search Time:  2 min.& 4 sec.Omissions: 4 randomly Search Strategy used: symmetrical horizontal left to right  Structured complex circles search Time: 1 min & 11  sec. Omissions: 0 Search Strategy used: symmetrical horizontal left to right  Unstructured Visual Array Random Plain Circles-Simple Time: 22 sec. Omissions: 0 Search strategy used: symmetrical horizontal left to right Random Plain Circles-Crowded: Time: 52 sec. Omissions: 0 Search Strategy used: symmetrica vertical Random Complex Circles Search Accurate responses:  Time: 2 min. & 4 sec.   Omissions: 3, misidentifications: 1-self corrected Search strategy used: both symmetrical horizontal rectilinear      PATIENT EDUCATION: Education details: Condition management/visual compensation strategies Person  educated: Patient and Spouse Education method: Explanation and Verbal cues Education comprehension: verbalized understanding and returned demonstration  HOME EXERCISE PROGRAM:  -  visual scanning tasks at the tabletop. -Visual memory tasks using playing cards, and gradually increasing the number of pairs.   GOALS: Goals reviewed with patient? Yes  SHORT TERM GOALS: Target date: 02/06/2024    Pt. Will independently utilize HEP for hand strength, coordination, and visual compensatory strategies ADL/ADLs Baseline: Eval: No  current HEP  Goal status: INITIAL   LONG TERM GOALS: Target date: 03/19/2024    Pt. Will be able to independently implement visual scanning/visual search compensatory strategies for tasks within her extra personal space navigating through community environments 100% of the time.  Baseline: 01/29/24:  Independent  100% of the time within the therapy gym, and hallway. Eval: Pt. Requires increased assist to navigate through community environments. Goal status: Achieved  2.  Pt. Will be able to independently utilize visual scanning/visual search strategies  during ADLS/IADL within her near space, and during tabletop top tasks. 100% of the time.  Baseline: 01/29/24: 75% Eval: Pt. Education to be provided.  Goal status: INITIAL  3.  Pt. Will be able to independently initiate visual scanning techniques in her own environment to reduce risk of falls.  Baseline: 01/29/24: Education was provided, Pt. Is utlizing visual compensatory strategies/visual scanning with in her home. Eval: Pt. Education to be provided.  Goal status: Achieved  4.  Pt. Will increase BUE Grip Strength by 5# to be able to independently hold objects for ADL/IADL use. Baseline: 01/29/24: Grip strength: Right: 39 lbs; Left: 28 lbs, Eval: Right Grip: 39#, Left Grip: 28# Goal status: Achieved  5.  Pt. Will increase L Lateral Pinch strength by 2# to be able to independently twist off bottle caps/lids. Baseline:  01/29/24: Lateral pinch: Right: 14 lbs, Left: 12 lbs Eval: Lateral pinch: Right: 11 lbs, Left: 9 lbs  Goal status: Achieved  6.  Pt. Will increase BUE strength for shoulder flexion/abduction by 2 mm grades to independently complete hair care tasks. Baseline: 01/29/24: 5/5 overall Eval: Right Shoulder Flexion: 4-/5, Left Shoulder Flexion:4-/5, Right Shoulder Abduction: 4-/5, Left Shoulder Abduction: 4-/5 Goal status: Achieved  7.  Pt will be indep with medication set up using weekly pill organizer.  Baseline: 01/29/24: Independent 01/15/24: Daughter currently manages medication set up.  Goal status: Achieved  8. Pt will increase typing speed to 20-30 words per minute with at least 90% accuracy to work towards more efficient typing for job related  responsibilities.  Baseline: 01/29/24: Continue 01/15/24: 8wpm x88% accuracy=7 wpm  Goal status: Achieved  9. Pt will be able to scan small print on store receipts to check for errors with 100% accuracy using visual compensation strategies as needed.  Baseline: 01/29/24: Pt. Continues to have difficulty scanning small print items. 01/15/24: Not yet attempted; required for return to work/job responsibility  Goal status:  Ongoing  10. Pt. Will independently identify 8 items consistently form visual visual memory in preparation or ADLs.    Baseline: 01/29/24: Pt. Is able to identify 6 items consistently from visual memory.    Gaol status: New ASSESSMENT: CLINICAL IMPRESSION:  Pt. Has made progress over this progress reporting period. Pt. has improve with, and met goals for Left UE strength, grip strength, pinch strength, and FMC skills. Pt. has met the goal for visually scanning efficiently throughout her extrapersonal space, as well as for setting up her pill organizer independently. Pt. continues to work on visual scanning tasks for tabletop tasks, and small receipts for work related tasks. As well as typing skills. A new goal was added to the POC to  address  visual memory skills during ADL/IADL tasks. Pt. continues to benefit from Occupational Therapy services to improve her ability to use visual compensatory strategies, and improve overall BUE functioning in order to improve engagement in, and maximize overall independence with ADL, and IADL tasks.  PERFORMANCE DEFICITS: in functional skills including ADLs, IADLs, coordination, dexterity, Fine motor control, and vision, and psychosocial skills including coping strategies, environmental adaptation, habits, interpersonal interactions, and routines and behaviors.   IMPAIRMENTS: are limiting patient from ADLs, IADLs, rest and sleep, work, leisure, and social participation.   CO-MORBIDITIES: may have co-morbidities  that affects occupational performance. Patient will benefit from skilled OT to address above impairments and improve overall function.  MODIFICATION OR ASSISTANCE TO COMPLETE EVALUATION: Min-Moderate modification of tasks or assist with assess necessary to complete an evaluation.  OT OCCUPATIONAL PROFILE AND HISTORY: Detailed assessment: Review of records and additional review of physical, cognitive, psychosocial history related to current functional performance.  CLINICAL DECISION MAKING: Moderate - several treatment options, min-mod task modification necessary  REHAB POTENTIAL: Good  EVALUATION COMPLEXITY: Moderate    PLAN:  OT FREQUENCY: 2x/week  OT DURATION: 12 weeks  PLANNED INTERVENTIONS: 97168 OT Re-evaluation, 97535 self care/ADL training, 02889 therapeutic exercise, 97530 therapeutic activity, 97112 neuromuscular re-education, visual/perceptual remediation/compensation, patient/family education, and DME and/or AE instructions  RECOMMENDED OTHER SERVICES: ST  CONSULTED AND AGREED WITH PLAN OF CARE: Patient and family member/caregiver  PLAN FOR NEXT SESSION: Treatment  Richardson Otter, MS, OTR/L   01/29/2024, 3:58 PM

## 2024-01-31 ENCOUNTER — Ambulatory Visit: Admitting: Occupational Therapy

## 2024-01-31 DIAGNOSIS — M6281 Muscle weakness (generalized): Secondary | ICD-10-CM | POA: Diagnosis not present

## 2024-01-31 DIAGNOSIS — H543 Unqualified visual loss, both eyes: Secondary | ICD-10-CM

## 2024-01-31 NOTE — Therapy (Signed)
 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   Cardiac arrhythmia    Nonspecific ST T wave changes on EKG   Chronic venous insufficiency of lower extremity    Complication of anesthesia    nausea and vomiting   DDD (degenerative disc disease), lumbosacral    Essential hypertension    Headache    History of kidney stones    Hyperlipidemia    Kidney stones    Lymphedema    Migraines    Osteoporosis    PONV (postoperative nausea and vomiting)    Right ureteral stone    Vitamin B12 deficiency    Vitamin D deficiency    Past Surgical History:  Procedure Laterality Date   AUGMENTATION MAMMAPLASTY     CESAREAN SECTION     x 4   COLONOSCOPY WITH PROPOFOL  N/A 03/31/2022   Procedure: COLONOSCOPY WITH PROPOFOL ;  Surgeon: Onita Elspeth Sharper, DO;  Location: [facility] ENDOSCOPY;  Service: Gastroenterology;  Laterality: N/A;   CYSTOSCOPY W/ RETROGRADES Bilateral 07/10/2020   Procedure: CYSTOSCOPY WITH RETROGRADE PYELOGRAM;  Surgeon: Francisca Redell BROCKS, MD;  Location: [facility] ORS;  Service: Urology;  Laterality: Bilateral;   CYSTOSCOPY/URETEROSCOPY/HOLMIUM LASER/STENT PLACEMENT     CYSTOSCOPY/URETEROSCOPY/HOLMIUM LASER/STENT PLACEMENT Bilateral 07/10/2020   Procedure: CYSTOSCOPY/URETEROSCOPY/HOLMIUM LASER/STENT PLACEMENT;  Surgeon: Francisca Redell BROCKS, MD;  Location: [facility] ORS;  Service: Urology;  Laterality: Bilateral;    CYSTOSCOPY/URETEROSCOPY/HOLMIUM LASER/STENT PLACEMENT Right 08/25/2023   Procedure: CYSTOSCOPY/URETEROSCOPY/HOLMIUM LASER;  Surgeon: Francisca Redell BROCKS, MD;  Location: [facility] ORS;  Service: Urology;  Laterality: Right;   CYSTOSCOPY/URETEROSCOPY/HOLMIUM LASER/STENT PLACEMENT Right 12/15/2023   Procedure: CYSTOSCOPY/URETEROSCOPY/HOLMIUM LASER;  Surgeon: Francisca Redell BROCKS, MD;  Location: [facility] ORS;  Service: Urology;  Laterality: Right;   EXTRACORPOREAL SHOCK WAVE LITHOTRIPSY     x 10 plus   Eye Lift     FACIAL COSMETIC SURGERY     GANGLION CYST EXCISION Right 12/21/2021   Procedure: REMOVAL GANGLION OF WRIST;  Surgeon: Kathlynn Sharper, MD;  Location: [facility] ORS;  Service: Orthopedics;  Laterality: Right;   LIPOSUCTION     TONSILLECTOMY     TRIGGER FINGER RELEASE Right 12/21/2021   Procedure: RELEASE TRIGGER FINGER/A-1 PULLEY;  Surgeon: Kathlynn Sharper, MD;  Location: [facility] ORS;  Service: Orthopedics;  Laterality: Right;   URETEROSCOPY WITH HOLMIUM LASER LITHOTRIPSY     Patient Active Problem List   Diagnosis Date Noted   Acute CVA (cerebrovascular accident) (HCC) 12/19/2023   Hypokalemia 12/19/2023   AKI (acute kidney injury) (HCC) 12/19/2023   Leukocytosis 12/19/2023   Hyperlipidemia, unspecified 12/19/2023   Essential hypertension 12/19/2023   Vision changes 12/19/2023   Lymphedema 05/22/2023   Chronic venous insufficiency 05/22/2023   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 replacement therapy 04/10/2023   Insomnia due to medical condition 04/25/2022  GAD (generalized anxiety disorder) 02/02/2022   Other specified depressive episodes 02/02/2022   Long-term current use of benzodiazepine 02/02/2022   Migraine with aura and without status migrainosus, not intractable 11/03/2020   Arrhythmia 08/06/2020   Status migrainosus 02/25/2019   Osteoporosis 04/09/2013   Vitamin D deficiency 04/09/2013   DDD (degenerative disc disease), lumbosacral 02/08/2005   ONSET DATE: 12/18/2023  REFERRING DIAG:   THERAPY  DIAG:  Low vision, both eyes  Rationale for Evaluation and Treatment: Rehabilitation  SUBJECTIVE:  SUBJECTIVE STATEMENT: Pt. Reports that she is still considering getting progressive lenses.  Pt accompanied by: significant other  PERTINENT HISTORY: Pt. has Hx of stroke with onset 12/18/2023. Pt. PMHx includes: HTN, Hyperlipidemia, Kidney Stones, near syncopal event, loss of vision.  PRECAUTIONS: None  WEIGHT BEARING RESTRICTIONS:  PAIN:  Are you having pain? No  FALLS: Has patient fallen in last 6 months? Yes. Number of falls    LIVING ENVIRONMENT: Lives with: lives with their family Lives in: [place], [place] Stairs: No, inside the house, yes but does not use Has following equipment at home:   PLOF: Independent  PATIENT GOALS: To be able to see  OBJECTIVE:  Note: Objective measures were completed at Evaluation unless otherwise noted.  HAND DOMINANCE: Right  ADLs:  Eating: Drinking from straw is different, able to use utensils.  Grooming: Fatigues completing hair care.  UB Dressing: independent, if clothes are in front of her she can find clothing LB Dressing: Independent Toileting: Independent Bathing: Independent Tub Shower transfers: Independent Equipment: none Has difficulty with using cell phone  IADLs: Shopping: Does not typically go out shopping, and has not tried to. Light housekeeping: Pt. reports that she does not typically do house cleaning. Pt. has been able to unpack belongings from boxes due to her recent move in. Meal Prep: Pt. reports that is does not currently cook, however reports that she probably could. Has difficulty opening bottle caps/lids. Community mobility: Requires assistance from husband to navigate  through buildings, negotiate stairs-Pt. With increased fear of falling  Medication management: Able to push down medication bottles to open  Financial management: No change in the process-uses automatic bill pay system.  Has difficulty with  using cell phone Handwriting: N/T Work: Pt. was actively working managing a Health visitor, works a lot of hours  MOBILITY STATUS: Independent and Needs Assist: Requires hand on hand assistance with nvigating through environments.   POSTURE COMMENTS:  No Significant postural limitations Sitting balance: Good  ACTIVITY TOLERANCE: Activity tolerance: Good  FUNCTIONAL OUTCOME MEASURES:   UPPER EXTREMITY ROM:    Active ROM Right Eval WFL Left Eval [facility]  Shoulder flexion    Shoulder abduction    Shoulder adduction    Shoulder extension    Shoulder internal rotation    Shoulder external rotation    Elbow flexion    Elbow extension    Wrist flexion    Wrist extension    Wrist ulnar deviation    Wrist radial deviation    Wrist pronation    Wrist supination    (Blank rows = not tested)  UPPER EXTREMITY MMT:     MMT Right eval  Left eval   Shoulder flexion 4-/5  4-/5   Shoulder abduction 4-/5  4-/5   Shoulder adduction      Shoulder extension      Shoulder internal rotation      Shoulder external rotation      Middle trapezius      Lower trapezius  Elbow flexion 5/5  5/5   Elbow extension 5/5  5/5   Wrist flexion 4/5  4-/5   Wrist extension 4/5  4-/5   Wrist ulnar deviation      Wrist radial deviation      Wrist pronation      Wrist supination      (Blank rows = not tested)  HAND FUNCTION: Grip strength: Right: 39 lbs; Left: 28 lbs, Lateral pinch: Right: 11 lbs, Left: 9 lbs, and 3 point pinch: Right: 9 lbs, Left: 9 lbs  01/29/24 Grip strength: Right: 39 lbs; Left: 36 lbs, Lateral pinch: Right: 14 lbs, Left: 12 lbs, and 3 point pinch: Right: 12 lbs, Left: 11 lbs   COORDINATION: 9 Hole Peg test: Right: 30 sec; Left: 36 sec  01/29/24 9 Hole Peg test: Right: 24 sec; Left: 23 sec   SENSATION: WFL  EDEMA:   MUSCLE TONE:   COGNITION: Overall cognitive status: Within functional limits for tasks assessed  VISION: Subjective report: Pt. Reports  changes in her vision at onset of stroke, starting with blurry vision, resulting in vision loss in the R eye.  Baseline vision: Pt. Is able to visually track  Visual history: Hx of eye surgery  VISION ASSESSMENT: TBD  PERCEPTION: WFL  PRAXIS: WFL  OBSERVATIONS:                                                                                                                    TREATMENT DATE: 01/31/2024   Therapeutic Activities:   -MVPT was administered as a screening tool to determine how vision may be affecting ADL/IADL functioning. Visual discrimination, figure ground, visual memory, and visual closure skills were assessed. -Pt. Was able to identify 34/36 items accurately. Missing one item in the figure ground subtest, and one item in the visual closure subtest.  Self-care:  -Pt. Worked on typing skills : 5 min. Typing test with 91% accuracy with net speed 10 wpm, followed by a 10 min. Typing test with 93% accuracy and net speed 12 wpm      PATIENT EDUCATION: Education details: Condition management/visual compensation strategies Person educated: Patient and Spouse Education method: Explanation and Verbal cues Education comprehension: verbalized understanding and returned demonstration  HOME EXERCISE PROGRAM:  -visual scanning tasks at the tabletop. -Visual memory tasks using playing cards, and gradually increasing the number of pairs.   GOALS: Goals reviewed with patient? Yes  SHORT TERM GOALS: Target date: 02/06/2024    Pt. Will independently utilize HEP for hand strength, coordination, and visual compensatory strategies ADL/ADLs Baseline: Eval: No  current HEP  Goal status: INITIAL   LONG TERM GOALS: Target date: 03/19/2024    Pt. Will be able to independently implement visual scanning/visual search compensatory strategies for tasks within her extra personal space navigating through community environments 100% of the time.  Baseline: 01/29/24:  Independent  100% of  the time within the therapy gym, and hallway. Eval: Pt. Requires increased assist to navigate through community environments. Goal status: Achieved  2.  Pt. Will be able to independently utilize visual scanning/visual search strategies  during ADLS/IADL within her near space, and during tabletop top tasks. 100% of the time.  Baseline: 01/29/24: 75% Eval: Pt. Education to be provided.  Goal status: INITIAL  3.  Pt. Will be able to independently initiate visual scanning techniques in her own environment to reduce risk of falls.  Baseline: 01/29/24: Education was provided, Pt. Is utlizing visual compensatory strategies/visual scanning with in her home. Eval: Pt. Education to be provided.  Goal status: Achieved  4.  Pt. Will increase BUE Grip Strength by 5# to be able to independently hold objects for ADL/IADL use. Baseline: 01/29/24: Grip strength: Right: 39 lbs; Left: 28 lbs, Eval: Right Grip: 39#, Left Grip: 28# Goal status: Achieved  5.  Pt. Will increase L Lateral Pinch strength by 2# to be able to independently twist off bottle caps/lids. Baseline: 01/29/24: Lateral pinch: Right: 14 lbs, Left: 12 lbs Eval: Lateral pinch: Right: 11 lbs, Left: 9 lbs  Goal status: Achieved  6.  Pt. Will increase BUE strength for shoulder flexion/abduction by 2 mm grades to independently complete hair care tasks. Baseline: 01/29/24: 5/5 overall Eval: Right Shoulder Flexion: 4-/5, Left Shoulder Flexion:4-/5, Right Shoulder Abduction: 4-/5, Left Shoulder Abduction: 4-/5 Goal status: Achieved  7.  Pt will be indep with medication set up using weekly pill organizer.  Baseline: 01/29/24: Independent 01/15/24: Daughter currently manages medication set up.  Goal status: Achieved  8. Pt will increase typing speed to 20-30 words per minute with at least 90% accuracy to work towards more efficient typing for job related  responsibilities.  Baseline: 01/29/24: Continue 01/15/24: 8wpm x88% accuracy=7 wpm  Goal status:  Achieved  9. Pt will be able to scan small print on store receipts to check for errors with 100% accuracy using visual compensation strategies as needed.  Baseline: 01/29/24: Pt. Continues to have difficulty scanning small print items. 01/15/24: Not yet attempted; required for return to work/job responsibility  Goal status:  Ongoing  10. Pt. Will independently identify 8 items consistently form visual visual memory in preparation or ADLs.    Baseline: 01/29/24: Pt. Is able to identify 6 items consistently from visual memory.    Gaol status: New ASSESSMENT: CLINICAL IMPRESSION:  Pt. Was able to complete 2 typing tests consecutively including a 5 min. And 10 min. Tests. Pt. Improved in both  typing speed, and accuracy when performing a 10 min. Typing test after completing a 5 min. One. Pt. Was able to complete accurately complete 34/36 items on the  MVPT with one item missed in the figure ground, and  one in visual closure sub group. Pt. continues to benefit from Occupational Therapy services to improve her ability to use visual compensatory strategies, and improve overall BUE functioning in order to improve engagement in, and maximize overall independence with ADL, and IADL tasks.  PERFORMANCE DEFICITS: in functional skills including ADLs, IADLs, coordination, dexterity, Fine motor control, and vision, and psychosocial skills including coping strategies, environmental adaptation, habits, interpersonal interactions, and routines and behaviors.   IMPAIRMENTS: are limiting patient from ADLs, IADLs, rest and sleep, work, leisure, and social participation.   CO-MORBIDITIES: may have co-morbidities  that affects occupational performance. Patient will benefit from skilled OT to address above impairments and improve overall function.  MODIFICATION OR ASSISTANCE TO COMPLETE EVALUATION: Min-Moderate modification of tasks or assist with assess necessary to complete an evaluation.  OT OCCUPATIONAL PROFILE AND  HISTORY: Detailed assessment: Review of records and  additional review of physical, cognitive, psychosocial history related to current functional performance.  CLINICAL DECISION MAKING: Moderate - several treatment options, min-mod task modification necessary  REHAB POTENTIAL: Good  EVALUATION COMPLEXITY: Moderate    PLAN:  OT FREQUENCY: 2x/week  OT DURATION: 12 weeks  PLANNED INTERVENTIONS: 97168 OT Re-evaluation, 97535 self care/ADL training, 02889 therapeutic exercise, 97530 therapeutic activity, 97112 neuromuscular re-education, visual/perceptual remediation/compensation, patient/family education, and DME and/or AE instructions  RECOMMENDED OTHER SERVICES: ST  CONSULTED AND AGREED WITH PLAN OF CARE: Patient and family member/caregiver  PLAN FOR NEXT SESSION: Treatment  Richardson Otter, MS, OTR/L   01/31/24, 5:58 PM

## 2024-02-05 ENCOUNTER — Encounter: Payer: Self-pay | Admitting: Occupational Therapy

## 2024-02-05 ENCOUNTER — Ambulatory Visit: Admitting: Occupational Therapy

## 2024-02-05 DIAGNOSIS — R278 Other lack of coordination: Secondary | ICD-10-CM

## 2024-02-05 DIAGNOSIS — M6281 Muscle weakness (generalized): Secondary | ICD-10-CM | POA: Diagnosis not present

## 2024-02-05 NOTE — Therapy (Signed)
 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   Cardiac arrhythmia    Nonspecific ST T wave changes on EKG   Chronic venous insufficiency of lower extremity    Complication of anesthesia    nausea and vomiting   DDD (degenerative disc disease), lumbosacral    Essential hypertension    Headache    History of kidney stones    Hyperlipidemia    Kidney stones    Lymphedema    Migraines    Osteoporosis    PONV (postoperative nausea and vomiting)    Right ureteral stone    Vitamin B12 deficiency    Vitamin D deficiency    Past Surgical History:  Procedure Laterality Date   AUGMENTATION MAMMAPLASTY     CESAREAN SECTION     x 4   COLONOSCOPY WITH PROPOFOL  N/A 03/31/2022   Procedure: COLONOSCOPY WITH PROPOFOL ;  Surgeon: Onita Elspeth Sharper, DO;  Location: [facility] ENDOSCOPY;  Service: Gastroenterology;  Laterality: N/A;   CYSTOSCOPY W/ RETROGRADES Bilateral 07/10/2020   Procedure: CYSTOSCOPY WITH RETROGRADE PYELOGRAM;  Surgeon: Francisca Redell BROCKS, MD;  Location: [facility] ORS;  Service: Urology;  Laterality: Bilateral;   CYSTOSCOPY/URETEROSCOPY/HOLMIUM LASER/STENT PLACEMENT     CYSTOSCOPY/URETEROSCOPY/HOLMIUM LASER/STENT PLACEMENT Bilateral 07/10/2020   Procedure: CYSTOSCOPY/URETEROSCOPY/HOLMIUM LASER/STENT PLACEMENT;  Surgeon: Francisca Redell BROCKS, MD;  Location: [facility] ORS;  Service: Urology;  Laterality: Bilateral;    CYSTOSCOPY/URETEROSCOPY/HOLMIUM LASER/STENT PLACEMENT Right 08/25/2023   Procedure: CYSTOSCOPY/URETEROSCOPY/HOLMIUM LASER;  Surgeon: Francisca Redell BROCKS, MD;  Location: [facility] ORS;  Service: Urology;  Laterality: Right;   CYSTOSCOPY/URETEROSCOPY/HOLMIUM LASER/STENT PLACEMENT Right 12/15/2023   Procedure: CYSTOSCOPY/URETEROSCOPY/HOLMIUM LASER;  Surgeon: Francisca Redell BROCKS, MD;  Location: [facility] ORS;  Service: Urology;  Laterality: Right;   EXTRACORPOREAL SHOCK WAVE LITHOTRIPSY     x 10 plus   Eye Lift     FACIAL COSMETIC SURGERY     GANGLION CYST EXCISION Right 12/21/2021   Procedure: REMOVAL GANGLION OF WRIST;  Surgeon: Kathlynn Sharper, MD;  Location: [facility] ORS;  Service: Orthopedics;  Laterality: Right;   LIPOSUCTION     TONSILLECTOMY     TRIGGER FINGER RELEASE Right 12/21/2021   Procedure: RELEASE TRIGGER FINGER/A-1 PULLEY;  Surgeon: Kathlynn Sharper, MD;  Location: [facility] ORS;  Service: Orthopedics;  Laterality: Right;   URETEROSCOPY WITH HOLMIUM LASER LITHOTRIPSY     Patient Active Problem List   Diagnosis Date Noted   Acute CVA (cerebrovascular accident) (HCC) 12/19/2023   Hypokalemia 12/19/2023   AKI (acute kidney injury) (HCC) 12/19/2023   Leukocytosis 12/19/2023   Hyperlipidemia, unspecified 12/19/2023   Essential hypertension 12/19/2023   Vision changes 12/19/2023   Lymphedema 05/22/2023   Chronic venous insufficiency 05/22/2023   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 replacement therapy 04/10/2023   Insomnia due to medical condition 04/25/2022  GAD (generalized anxiety disorder) 02/02/2022   Other specified depressive episodes 02/02/2022   Long-term current use of benzodiazepine 02/02/2022   Migraine with aura and without status migrainosus, not intractable 11/03/2020   Arrhythmia 08/06/2020   Status migrainosus 02/25/2019   Osteoporosis 04/09/2013   Vitamin D deficiency 04/09/2013   DDD (degenerative disc disease), lumbosacral 02/08/2005   ONSET DATE: 12/18/2023  REFERRING DIAG:   THERAPY  DIAG:  Other lack of coordination  Muscle weakness (generalized)  Rationale for Evaluation and Treatment: Rehabilitation  SUBJECTIVE:  SUBJECTIVE STATEMENT: Pt reports she misread cooking instructions and used milk instead of water  Pt accompanied by: significant other  PERTINENT HISTORY: Pt. has Hx of stroke with onset 12/18/2023. Pt. PMHx includes: HTN, Hyperlipidemia, Kidney Stones, near syncopal event, loss of vision.  PRECAUTIONS: None  WEIGHT BEARING RESTRICTIONS:  PAIN:  Are you having pain? No  FALLS: Has patient fallen in last 6 months? Yes. Number of falls    LIVING ENVIRONMENT: Lives with: lives with their family Lives in: [place], [place] Stairs: No, inside the house, yes but does not use Has following equipment at home:   PLOF: Independent  PATIENT GOALS: To be able to see  OBJECTIVE:  Note: Objective measures were completed at Evaluation unless otherwise noted.  HAND DOMINANCE: Right  ADLs:  Eating: Drinking from straw is different, able to use utensils.  Grooming: Fatigues completing hair care.  UB Dressing: independent, if clothes are in front of her she can find clothing LB Dressing: Independent Toileting: Independent Bathing: Independent Tub Shower transfers: Independent Equipment: none Has difficulty with using cell phone  IADLs: Shopping: Does not typically go out shopping, and has not tried to. Light housekeeping: Pt. reports that she does not typically do house cleaning. Pt. has been able to unpack belongings from boxes due to her recent move in. Meal Prep: Pt. reports that is does not currently cook, however reports that she probably could. Has difficulty opening bottle caps/lids. Community mobility: Requires assistance from husband to navigate  through buildings, negotiate stairs-Pt. With increased fear of falling  Medication management: Able to push down medication bottles to open  Financial management: No change in the process-uses automatic  bill pay system.  Has difficulty with using cell phone Handwriting: N/T Work: Pt. was actively working managing a Health visitor, works a lot of hours  MOBILITY STATUS: Independent and Needs Assist: Requires hand on hand assistance with nvigating through environments.   POSTURE COMMENTS:  No Significant postural limitations Sitting balance: Good  ACTIVITY TOLERANCE: Activity tolerance: Good  FUNCTIONAL OUTCOME MEASURES:   UPPER EXTREMITY ROM:    Active ROM Right Eval WFL Left Eval [facility]  Shoulder flexion    Shoulder abduction    Shoulder adduction    Shoulder extension    Shoulder internal rotation    Shoulder external rotation    Elbow flexion    Elbow extension    Wrist flexion    Wrist extension    Wrist ulnar deviation    Wrist radial deviation    Wrist pronation    Wrist supination    (Blank rows = not tested)  UPPER EXTREMITY MMT:     MMT Right eval  Left eval   Shoulder flexion 4-/5  4-/5   Shoulder abduction 4-/5  4-/5   Shoulder adduction      Shoulder extension      Shoulder internal rotation      Shoulder external rotation      Middle trapezius  Lower trapezius      Elbow flexion 5/5  5/5   Elbow extension 5/5  5/5   Wrist flexion 4/5  4-/5   Wrist extension 4/5  4-/5   Wrist ulnar deviation      Wrist radial deviation      Wrist pronation      Wrist supination      (Blank rows = not tested)  HAND FUNCTION: Grip strength: Right: 39 lbs; Left: 28 lbs, Lateral pinch: Right: 11 lbs, Left: 9 lbs, and 3 point pinch: Right: 9 lbs, Left: 9 lbs  01/29/24 Grip strength: Right: 39 lbs; Left: 36 lbs, Lateral pinch: Right: 14 lbs, Left: 12 lbs, and 3 point pinch: Right: 12 lbs, Left: 11 lbs   COORDINATION: 9 Hole Peg test: Right: 30 sec; Left: 36 sec  01/29/24 9 Hole Peg test: Right: 24 sec; Left: 23 sec   SENSATION: WFL  EDEMA:   MUSCLE TONE:   COGNITION: Overall cognitive status: Within functional limits for tasks  assessed  VISION: Subjective report: Pt. Reports changes in her vision at onset of stroke, starting with blurry vision, resulting in vision loss in the R eye.  Baseline vision: Pt. Is able to visually track  Visual history: Hx of eye surgery  VISION ASSESSMENT: TBD  PERCEPTION: WFL  PRAXIS: WFL  OBSERVATIONS:                                                                                                                    TREATMENT DATE: 02/05/2024   Therapeutic Activities:  -Pt completed visual closure worksheet with 75% accuracy. -Pt completed Constance test for unilateral neglect with only 1 error, however did require significant cues to understand instructions as pt stated she was looking for squares. -Pt completed the Trail Making Test (TMT), which measures several functions including cognitive flexibility, alternating attention, sequencing, visual search, and motor speed. Pt scored 25/25 correct on Trial Making Test Part A in 52 sec and 24/24 correct on Trail Making Test Part B in 1 min 34 sec.  -Pt completed 2 trials of visual memory activity copying 4x4 grid of shapes (triangles, hearts, squares, ovals, diamonds) with 10 sec intervals of scanning picture. 1st trial pt required x8 viewings of original copy with 6 errors. Discussed planning a strategy for 2nd trial, planned to recall 2-3 squares each viewing. 2nd trial pt required x6 viewings with x1 error.   Self-care: -Educated on activities for improved short term memory and strategies including reminders in phone or notepad.  -Condition mgmt education as pt presents with anxiousness about recovery process and return to work.       PATIENT EDUCATION: Education details: Condition management/visual compensation strategies Person educated: Patient and Spouse Education method: Explanation and Verbal cues Education comprehension: verbalized understanding and returned demonstration  HOME EXERCISE PROGRAM:  -visual scanning  tasks at the tabletop. -Visual memory tasks using playing cards, and gradually increasing the number of pairs.   GOALS: Goals reviewed with patient? Yes  SHORT TERM  GOALS: Target date: 02/06/2024    Pt. Will independently utilize HEP for hand strength, coordination, and visual compensatory strategies ADL/ADLs Baseline: Eval: No  current HEP  Goal status: INITIAL   LONG TERM GOALS: Target date: 03/19/2024    Pt. Will be able to independently implement visual scanning/visual search compensatory strategies for tasks within her extra personal space navigating through community environments 100% of the time.  Baseline: 01/29/24:  Independent  100% of the time within the therapy gym, and hallway. Eval: Pt. Requires increased assist to navigate through community environments. Goal status: Achieved  2.  Pt. Will be able to independently utilize visual scanning/visual search strategies  during ADLS/IADL within her near space, and during tabletop top tasks. 100% of the time.  Baseline: 01/29/24: 75% Eval: Pt. Education to be provided.  Goal status: INITIAL  3.  Pt. Will be able to independently initiate visual scanning techniques in her own environment to reduce risk of falls.  Baseline: 01/29/24: Education was provided, Pt. Is utlizing visual compensatory strategies/visual scanning with in her home. Eval: Pt. Education to be provided.  Goal status: Achieved  4.  Pt. Will increase BUE Grip Strength by 5# to be able to independently hold objects for ADL/IADL use. Baseline: 01/29/24: Grip strength: Right: 39 lbs; Left: 28 lbs, Eval: Right Grip: 39#, Left Grip: 28# Goal status: Achieved  5.  Pt. Will increase L Lateral Pinch strength by 2# to be able to independently twist off bottle caps/lids. Baseline: 01/29/24: Lateral pinch: Right: 14 lbs, Left: 12 lbs Eval: Lateral pinch: Right: 11 lbs, Left: 9 lbs  Goal status: Achieved  6.  Pt. Will increase BUE strength for shoulder flexion/abduction by 2  mm grades to independently complete hair care tasks. Baseline: 01/29/24: 5/5 overall Eval: Right Shoulder Flexion: 4-/5, Left Shoulder Flexion:4-/5, Right Shoulder Abduction: 4-/5, Left Shoulder Abduction: 4-/5 Goal status: Achieved  7.  Pt will be indep with medication set up using weekly pill organizer.  Baseline: 01/29/24: Independent 01/15/24: Daughter currently manages medication set up.  Goal status: Achieved  8. Pt will increase typing speed to 20-30 words per minute with at least 90% accuracy to work towards more efficient typing for job related  responsibilities.  Baseline: 01/29/24: Continue 01/15/24: 8wpm x88% accuracy=7 wpm  Goal status: Achieved  9. Pt will be able to scan small print on store receipts to check for errors with 100% accuracy using visual compensation strategies as needed.  Baseline: 01/29/24: Pt. Continues to have difficulty scanning small print items. 01/15/24: Not yet attempted; required for return to work/job responsibility  Goal status:  Ongoing  10. Pt. Will independently identify 8 items consistently form visual visual memory in preparation or ADLs.    Baseline: 01/29/24: Pt. Is able to identify 6 items consistently from visual memory.    Gaol status: New ASSESSMENT: CLINICAL IMPRESSION:  Pt requesting vision exercises for HEP - directed to find figure/ground picture activity book, word searches, and mazes. Pt completed 2 trials of visual memory activity copying 4x4 grid of shapes (triangles, hearts, squares, ovals, diamonds) with 10 sec intervals of scanning picture. Educated pt on importance of planning strategy to compensate for visual memory deficits - 2nd trial pt required fewer viewings and decreased errors with implementation of strategy. Plan to bring receipts in next session to trial reading. Repeated education on condition management provided and Speech Therapy evaluation recommended to assess STM deficits and develop compensatory strategies. Pt. continues  to benefit from Occupational Therapy services to improve her ability  to use visual compensatory strategies, and improve overall BUE functioning in order to improve engagement in, and maximize overall independence with ADL, and IADL tasks.  PERFORMANCE DEFICITS: in functional skills including ADLs, IADLs, coordination, dexterity, Fine motor control, and vision, and psychosocial skills including coping strategies, environmental adaptation, habits, interpersonal interactions, and routines and behaviors.   IMPAIRMENTS: are limiting patient from ADLs, IADLs, rest and sleep, work, leisure, and social participation.   CO-MORBIDITIES: may have co-morbidities  that affects occupational performance. Patient will benefit from skilled OT to address above impairments and improve overall function.  MODIFICATION OR ASSISTANCE TO COMPLETE EVALUATION: Min-Moderate modification of tasks or assist with assess necessary to complete an evaluation.  OT OCCUPATIONAL PROFILE AND HISTORY: Detailed assessment: Review of records and additional review of physical, cognitive, psychosocial history related to current functional performance.  CLINICAL DECISION MAKING: Moderate - several treatment options, min-mod task modification necessary  REHAB POTENTIAL: Good  EVALUATION COMPLEXITY: Moderate    PLAN:  OT FREQUENCY: 2x/week  OT DURATION: 12 weeks  PLANNED INTERVENTIONS: 97168 OT Re-evaluation, 97535 self care/ADL training, 02889 therapeutic exercise, 97530 therapeutic activity, 97112 neuromuscular re-education, visual/perceptual remediation/compensation, patient/family education, and DME and/or AE instructions  RECOMMENDED OTHER SERVICES: ST  CONSULTED AND AGREED WITH PLAN OF CARE: Patient and family member/caregiver  PLAN FOR NEXT SESSION: Treatment  Elston Slot, M.S. OTR/L  02/05/24, 1:59 PM  ascom [phone]   01/31/24, 1:59 PM

## 2024-02-07 ENCOUNTER — Encounter: Payer: Self-pay | Admitting: Occupational Therapy

## 2024-02-07 ENCOUNTER — Ambulatory Visit: Admitting: Occupational Therapy

## 2024-02-07 DIAGNOSIS — R278 Other lack of coordination: Secondary | ICD-10-CM

## 2024-02-07 DIAGNOSIS — M6281 Muscle weakness (generalized): Secondary | ICD-10-CM

## 2024-02-07 NOTE — Therapy (Signed)
 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   Cardiac arrhythmia    Nonspecific ST T wave changes on EKG   Chronic venous insufficiency of lower extremity    Complication of anesthesia    nausea and vomiting   DDD (degenerative disc disease), lumbosacral    Essential hypertension    Headache    History of kidney stones    Hyperlipidemia    Kidney stones    Lymphedema    Migraines    Osteoporosis    PONV (postoperative nausea and vomiting)    Right ureteral stone    Vitamin B12 deficiency    Vitamin D deficiency    Past Surgical History:  Procedure Laterality Date   AUGMENTATION MAMMAPLASTY     CESAREAN SECTION     x 4   COLONOSCOPY WITH PROPOFOL  N/A 03/31/2022   Procedure: COLONOSCOPY WITH PROPOFOL ;  Surgeon: Onita Elspeth Sharper, DO;  Location: [facility] ENDOSCOPY;  Service: Gastroenterology;  Laterality: N/A;   CYSTOSCOPY W/ RETROGRADES Bilateral 07/10/2020   Procedure: CYSTOSCOPY WITH RETROGRADE PYELOGRAM;  Surgeon: Francisca Redell BROCKS, MD;  Location: [facility] ORS;  Service: Urology;  Laterality: Bilateral;   CYSTOSCOPY/URETEROSCOPY/HOLMIUM LASER/STENT PLACEMENT     CYSTOSCOPY/URETEROSCOPY/HOLMIUM LASER/STENT PLACEMENT Bilateral 07/10/2020   Procedure: CYSTOSCOPY/URETEROSCOPY/HOLMIUM LASER/STENT PLACEMENT;  Surgeon: Francisca Redell BROCKS, MD;  Location: [facility] ORS;  Service: Urology;  Laterality: Bilateral;    CYSTOSCOPY/URETEROSCOPY/HOLMIUM LASER/STENT PLACEMENT Right 08/25/2023   Procedure: CYSTOSCOPY/URETEROSCOPY/HOLMIUM LASER;  Surgeon: Francisca Redell BROCKS, MD;  Location: [facility] ORS;  Service: Urology;  Laterality: Right;   CYSTOSCOPY/URETEROSCOPY/HOLMIUM LASER/STENT PLACEMENT Right 12/15/2023   Procedure: CYSTOSCOPY/URETEROSCOPY/HOLMIUM LASER;  Surgeon: Francisca Redell BROCKS, MD;  Location: [facility] ORS;  Service: Urology;  Laterality: Right;   EXTRACORPOREAL SHOCK WAVE LITHOTRIPSY     x 10 plus   Eye Lift     FACIAL COSMETIC SURGERY     GANGLION CYST EXCISION Right 12/21/2021   Procedure: REMOVAL GANGLION OF WRIST;  Surgeon: Kathlynn Sharper, MD;  Location: [facility] ORS;  Service: Orthopedics;  Laterality: Right;   LIPOSUCTION     TONSILLECTOMY     TRIGGER FINGER RELEASE Right 12/21/2021   Procedure: RELEASE TRIGGER FINGER/A-1 PULLEY;  Surgeon: Kathlynn Sharper, MD;  Location: [facility] ORS;  Service: Orthopedics;  Laterality: Right;   URETEROSCOPY WITH HOLMIUM LASER LITHOTRIPSY     Patient Active Problem List   Diagnosis Date Noted   Acute CVA (cerebrovascular accident) (HCC) 12/19/2023   Hypokalemia 12/19/2023   AKI (acute kidney injury) (HCC) 12/19/2023   Leukocytosis 12/19/2023   Hyperlipidemia, unspecified 12/19/2023   Essential hypertension 12/19/2023   Vision changes 12/19/2023   Lymphedema 05/22/2023   Chronic venous insufficiency 05/22/2023   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 replacement therapy 04/10/2023   Insomnia due to medical condition 04/25/2022  GAD (generalized anxiety disorder) 02/02/2022   Other specified depressive episodes 02/02/2022   Long-term current use of benzodiazepine 02/02/2022   Migraine with aura and without status migrainosus, not intractable 11/03/2020   Arrhythmia 08/06/2020   Status migrainosus 02/25/2019   Osteoporosis 04/09/2013   Vitamin D deficiency 04/09/2013   DDD (degenerative disc disease), lumbosacral 02/08/2005   ONSET DATE: 12/18/2023  REFERRING DIAG:   THERAPY  DIAG:  Other lack of coordination  Muscle weakness (generalized)  Rationale for Evaluation and Treatment: Rehabilitation  SUBJECTIVE:  SUBJECTIVE STATEMENT: Pt reports she can find the beginning of the page but doesn't go all the way across to finish reading.  Pt accompanied by: significant other  PERTINENT HISTORY: Pt. has Hx of stroke with onset 12/18/2023. Pt. PMHx includes: HTN, Hyperlipidemia, Kidney Stones, near syncopal event, loss of vision.  PRECAUTIONS: None  WEIGHT BEARING RESTRICTIONS:  PAIN:  Are you having pain? No  FALLS: Has patient fallen in last 6 months? Yes. Number of falls    LIVING ENVIRONMENT: Lives with: lives with their family Lives in: [place], [place] Stairs: No, inside the house, yes but does not use Has following equipment at home:   PLOF: Independent  PATIENT GOALS: To be able to see  OBJECTIVE:  Note: Objective measures were completed at Evaluation unless otherwise noted.  HAND DOMINANCE: Right  ADLs:  Eating: Drinking from straw is different, able to use utensils.  Grooming: Fatigues completing hair care.  UB Dressing: independent, if clothes are in front of her she can find clothing LB Dressing: Independent Toileting: Independent Bathing: Independent Tub Shower transfers: Independent Equipment: none Has difficulty with using cell phone  IADLs: Shopping: Does not typically go out shopping, and has not tried to. Light housekeeping: Pt. reports that she does not typically do house cleaning. Pt. has been able to unpack belongings from boxes due to her recent move in. Meal Prep: Pt. reports that is does not currently cook, however reports that she probably could. Has difficulty opening bottle caps/lids. Community mobility: Requires assistance from husband to navigate  through buildings, negotiate stairs-Pt. With increased fear of falling  Medication management: Able to push down medication bottles to open  Financial management: No change  in the process-uses automatic bill pay system.  Has difficulty with using cell phone Handwriting: N/T Work: Pt. was actively working managing a Health visitor, works a lot of hours  MOBILITY STATUS: Independent and Needs Assist: Requires hand on hand assistance with nvigating through environments.   POSTURE COMMENTS:  No Significant postural limitations Sitting balance: Good  ACTIVITY TOLERANCE: Activity tolerance: Good  FUNCTIONAL OUTCOME MEASURES:   UPPER EXTREMITY ROM:    Active ROM Right Eval WFL Left Eval [facility]  Shoulder flexion    Shoulder abduction    Shoulder adduction    Shoulder extension    Shoulder internal rotation    Shoulder external rotation    Elbow flexion    Elbow extension    Wrist flexion    Wrist extension    Wrist ulnar deviation    Wrist radial deviation    Wrist pronation    Wrist supination    (Blank rows = not tested)  UPPER EXTREMITY MMT:     MMT Right eval  Left eval   Shoulder flexion 4-/5  4-/5   Shoulder abduction 4-/5  4-/5   Shoulder adduction      Shoulder extension      Shoulder internal rotation      Shoulder external rotation  Middle trapezius      Lower trapezius      Elbow flexion 5/5  5/5   Elbow extension 5/5  5/5   Wrist flexion 4/5  4-/5   Wrist extension 4/5  4-/5   Wrist ulnar deviation      Wrist radial deviation      Wrist pronation      Wrist supination      (Blank rows = not tested)  HAND FUNCTION: Grip strength: Right: 39 lbs; Left: 28 lbs, Lateral pinch: Right: 11 lbs, Left: 9 lbs, and 3 point pinch: Right: 9 lbs, Left: 9 lbs  01/29/24 Grip strength: Right: 39 lbs; Left: 36 lbs, Lateral pinch: Right: 14 lbs, Left: 12 lbs, and 3 point pinch: Right: 12 lbs, Left: 11 lbs   COORDINATION: 9 Hole Peg test: Right: 30 sec; Left: 36 sec  01/29/24 9 Hole Peg test: Right: 24 sec; Left: 23 sec   SENSATION: WFL  EDEMA:   MUSCLE TONE:   COGNITION: Overall cognitive status: Within functional  limits for tasks assessed  VISION: Subjective report: Pt. Reports changes in her vision at onset of stroke, starting with blurry vision, resulting in vision loss in the R eye.  Baseline vision: Pt. Is able to visually track  Visual history: Hx of eye surgery  VISION ASSESSMENT: TBD  PERCEPTION: WFL  PRAXIS: WFL  OBSERVATIONS:                                                                                                                    TREATMENT DATE: 02/07/2024   Therapeutic Activities:  -Pt completed seated saccades worksheet to improve accuracy and speed of eye movements with minimal head turns. Sheet placed 3 ft from pt. 100% accuracy to read letters on 1 page document -2nd saccades worksheet completed in standing with x4 pages placed in square at above and below eye height, pt instructed to read 1st letter from upper left, upper right, lower left, then lower right page to perform Z pattern saccade eye movements to simulate reading eye movements. 75% accuracy with greatest errors in middle of each page.     Self-care: -Educated on trial of ReadRight at home computer with handout provided, reports plan for children to assist with setup.  -Condition mgmt education as pt presents with anxiousness about recovery process and return to work.  -Plan on prism  glasses for next session as pt is picking them up today. Educated on glasses wearing schedule as pt reports she has not been wearing her long distance glasses while walking because she does not want to rely on them - reviewed importance of utilizing compensatory strategies.       PATIENT EDUCATION: Education details: Condition management/visual compensation strategies Person educated: Patient and Spouse Education method: Explanation and Verbal cues Education comprehension: verbalized understanding and returned demonstration  HOME EXERCISE PROGRAM:  -visual scanning tasks at the tabletop. -Visual memory tasks using playing  cards, and gradually increasing the number of pairs.   GOALS: Goals reviewed  with patient? Yes  SHORT TERM GOALS: Target date: 02/06/2024    Pt. Will independently utilize HEP for hand strength, coordination, and visual compensatory strategies ADL/ADLs Baseline: Eval: No  current HEP  Goal status: INITIAL   LONG TERM GOALS: Target date: 03/19/2024    Pt. Will be able to independently implement visual scanning/visual search compensatory strategies for tasks within her extra personal space navigating through community environments 100% of the time.  Baseline: 01/29/24:  Independent  100% of the time within the therapy gym, and hallway. Eval: Pt. Requires increased assist to navigate through community environments. Goal status: Achieved  2.  Pt. Will be able to independently utilize visual scanning/visual search strategies  during ADLS/IADL within her near space, and during tabletop top tasks. 100% of the time.  Baseline: 01/29/24: 75% Eval: Pt. Education to be provided.  Goal status: INITIAL  3.  Pt. Will be able to independently initiate visual scanning techniques in her own environment to reduce risk of falls.  Baseline: 01/29/24: Education was provided, Pt. Is utlizing visual compensatory strategies/visual scanning with in her home. Eval: Pt. Education to be provided.  Goal status: Achieved  4.  Pt. Will increase BUE Grip Strength by 5# to be able to independently hold objects for ADL/IADL use. Baseline: 01/29/24: Grip strength: Right: 39 lbs; Left: 28 lbs, Eval: Right Grip: 39#, Left Grip: 28# Goal status: Achieved  5.  Pt. Will increase L Lateral Pinch strength by 2# to be able to independently twist off bottle caps/lids. Baseline: 01/29/24: Lateral pinch: Right: 14 lbs, Left: 12 lbs Eval: Lateral pinch: Right: 11 lbs, Left: 9 lbs  Goal status: Achieved  6.  Pt. Will increase BUE strength for shoulder flexion/abduction by 2 mm grades to independently complete hair care  tasks. Baseline: 01/29/24: 5/5 overall Eval: Right Shoulder Flexion: 4-/5, Left Shoulder Flexion:4-/5, Right Shoulder Abduction: 4-/5, Left Shoulder Abduction: 4-/5 Goal status: Achieved  7.  Pt will be indep with medication set up using weekly pill organizer.  Baseline: 01/29/24: Independent 01/15/24: Daughter currently manages medication set up.  Goal status: Achieved  8. Pt will increase typing speed to 20-30 words per minute with at least 90% accuracy to work towards more efficient typing for job related  responsibilities.  Baseline: 01/29/24: Continue 01/15/24: 8wpm x88% accuracy=7 wpm  Goal status: Achieved  9. Pt will be able to scan small print on store receipts to check for errors with 100% accuracy using visual compensation strategies as needed.  Baseline: 01/29/24: Pt. Continues to have difficulty scanning small print items. 01/15/24: Not yet attempted; required for return to work/job responsibility  Goal status:  Ongoing  10. Pt. Will independently identify 8 items consistently form visual visual memory in preparation or ADLs.    Baseline: 01/29/24: Pt. Is able to identify 6 items consistently from visual memory.    Gaol status: New ASSESSMENT: CLINICAL IMPRESSION:  Pt completed seated 1 pg saccades worksheet with 100% accuracy, decreasing to 75% accuracy standing 4 pg saccades with cues for z pattern eye movements to improve reading accuracy and speed while minimizing head turns. Educated on [employer] to trial on home computer, handout provided. Continues to require education on condition management, poor recall from last session. Pt. continues to benefit from Occupational Therapy services to improve her ability to use visual compensatory strategies, and improve overall BUE functioning in order to improve engagement in, and maximize overall independence with ADL, and IADL tasks.  PERFORMANCE DEFICITS: in functional skills including ADLs, IADLs, coordination, dexterity,  Fine  motor control, and vision, and psychosocial skills including coping strategies, environmental adaptation, habits, interpersonal interactions, and routines and behaviors.   IMPAIRMENTS: are limiting patient from ADLs, IADLs, rest and sleep, work, leisure, and social participation.   CO-MORBIDITIES: may have co-morbidities  that affects occupational performance. Patient will benefit from skilled OT to address above impairments and improve overall function.  MODIFICATION OR ASSISTANCE TO COMPLETE EVALUATION: Min-Moderate modification of tasks or assist with assess necessary to complete an evaluation.  OT OCCUPATIONAL PROFILE AND HISTORY: Detailed assessment: Review of records and additional review of physical, cognitive, psychosocial history related to current functional performance.  CLINICAL DECISION MAKING: Moderate - several treatment options, min-mod task modification necessary  REHAB POTENTIAL: Good  EVALUATION COMPLEXITY: Moderate    PLAN:  OT FREQUENCY: 2x/week  OT DURATION: 12 weeks  PLANNED INTERVENTIONS: 97168 OT Re-evaluation, 97535 self care/ADL training, 02889 therapeutic exercise, 97530 therapeutic activity, 97112 neuromuscular re-education, visual/perceptual remediation/compensation, patient/family education, and DME and/or AE instructions  RECOMMENDED OTHER SERVICES: ST  CONSULTED AND AGREED WITH PLAN OF CARE: Patient and family member/caregiver  PLAN FOR NEXT SESSION: Treatment  Elston Slot, M.S. OTR/L  02/07/24, 2:04 PM  ascom [phone]   01/31/24, 2:04 PM

## 2024-02-12 ENCOUNTER — Ambulatory Visit: Admitting: Occupational Therapy

## 2024-02-12 DIAGNOSIS — H543 Unqualified visual loss, both eyes: Secondary | ICD-10-CM

## 2024-02-12 DIAGNOSIS — M6281 Muscle weakness (generalized): Secondary | ICD-10-CM | POA: Diagnosis not present

## 2024-02-12 NOTE — Progress Notes (Signed)
 [facility] Chief Complaint:   Chief Complaint  Patient presents with  . Left Knee - Follow-up, Pain    History of Present Illness:    Ashley Collins is a 63 y.o. female that presents to clinic today for follow up evaluation and management of ongoing left knee pain possibly due to localized patellofemoral arthritis change with patellofemoral dysfunction.  They were last evaluated by myself on 11/17/2023.  At that time, the plan was to perform a repeat intra-articular knee joint steroid injection, then follow-up depending on response.  She had good improvement from injection again, however, her symptoms have recurred so she comes in for reevaluation.  At the time of this visit, I reviewed her chart which showed she has had a stroke that is currently being treated with various types of physical therapy.  Her most recent labs from 12/31/2023 show creatinine 0.78, normal electrolytes.  Left Knee The patient reports that the symptoms began  Gradually without injury Primary symptoms include pain, stiffness and weakness. She reports the pain is present in her inside joint line, knee cap region and patella tendon and describes the pain as aching On a scale of 0 (no pain) to 10 (worst pain ever), her pain is at a 5/10 and the pain is constant. Since the problem started it is not changing and Her pain does not prevent her from performing her hobbies. She is not able to perform sports at the same level as before the injury. She reports that her knee does feel grossly unstable and that her symptoms do not wake her from sleep. She rates her percentage of normal daily living at 80%. Symptoms are improved by heat and injections and worsened by bending and normal daily activities.   She has had a ongoing symptoms now for about 6 months without memorable injury.  She has done treatment with activity modification, ice/heat, steroid injections with ongoing issues.  She denies  associated swelling, locking/catching, instability, numbness or tingling, weakness, fevers or chills, night sweats, weight loss, skin color change, pain at night.     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.    She feels like her symptoms are tolerable but she does make concessions to activities to avoid pain.  Of note, she had a stroke and has been taken out of work until January 2026.  Medications, Past Medical/Surgical/Family/Social History:   Current Outpatient Medications  Medication Sig Dispense Refill  . amitriptyline  (ELAVIL ) 75 MG tablet Take 75 mg by mouth at bedtime    . aspirin  81 MG EC tablet Take 81 mg by mouth once daily    . atorvastatin  (LIPITOR) 40 MG tablet Take 1 tablet (40 mg total) by mouth once daily for 90 days 90 tablet 1  . biotin 10,000 mcg Cap Take by mouth    . fremanezumab-vfrm (AJOVY AUTOINJECTOR) 225 mg/1.5 mL AtIn Inject 1.5 mLs subcutaneously monthly 1.5 mL 6  . LORazepam  (ATIVAN ) 0.5 MG tablet     . olmesartan (BENICAR) 20 MG tablet Take 0.5 tablet daily, may increase to 1 tablet 30 tablet 0  . onabotulinumtoxinA, cosmetic, (BOTOX) 50 unit injection Inject 1 Dose into the muscle    . zolpidem  (AMBIEN ) 10 mg tablet Take 10 mg by mouth at bedtime as needed for Sleep    . ergocalciferol, vitamin D2, 1,250 mcg (50,000 unit) capsule Take 1 capsule (50,000 Units total) by mouth once a week for 30 days 12  capsule 0   Current Facility-Administered Medications  Medication Dose Route Frequency Provider Last Rate Last Admin  . cyanocobalamin (VITAMIN B12) injection 1,000 mcg  1,000 mcg Intramuscular Q30 Days Harvey Gaetana Helling, NP   1,000 mcg at 02/01/24 1335    SECONDARY CONDITIONS THAT INFLUENCE TREATMENT AND DECISION-MAKING:  Former smoker   Past Medical History: History of stroke, hyperlipidemia on statin, hypertension, vitamin D deficiency, depression, anxiety, history of migraines, osteoporosis   Past Surgical History:  Breast implants, c-section x4   Relevant Orthopedic Family History: Osteoporosis  Physical Examination:   BP 138/84   Ht 157.5 cm (5' 2)   Wt 54.1 kg (119 lb 3.2 oz)   LMP  (LMP Unknown)   BMI 21.80 kg/m  General/Constitutional: Well-nourished, well developed, no apparent distress. Psych: Normal mood and affect.  Conversant.  Judgement intact. Musculoskeletal: Comprehensive Knee Exam: Gait Antalgic but fluid   Alignment Neutral    Inspection   Right Left  Skin Normal appearance with no obvious deformity.  No ecchymosis or erythema. Normal appearance with no obvious deformity.  No ecchymosis or erythema.  Soft Tissue No focal soft tissue swelling No focal soft tissue swelling  Quad Atrophy None None    Palpation    Right Left  Tenderness No peripatellar, patellar tendon, quad tendon, medial/lateral joint line pain  + peripatellar, medial joint line.   No patellar tendon, quad tendon, lateral joint line pain  Crepitus No patellofemoral or tibiofemoral crepitus No patellofemoral or tibiofemoral crepitus  Effusion None None    Range of Motion   Right Left  Flexion  Normal AROM Normal AROM  Extension  Full knee extension without hyperextension Full knee extension without hyperextension    Ligamentous Exam   Right Left  Lachman Normal Normal  Valgus 0 Normal Normal  Valgus 30 Normal Normal  Varus 0 Normal Normal  Varus 30 Normal Normal  Anterior Drawer Normal Normal  Posterior Drawer Normal Normal    Meniscal Exam   Right Left  Hyperflexion Test Negative Painful  Hyperextension Test Negative Painful  McMurray's Negative Positive for medial knee pain                Neurovascular   Right Left  Quadriceps Strength 5/5 5/5  Hamstring Strength 5/5 5/5  Distal Motor Normal Normal  Distal Sensory Normal light touch sensation Normal light touch sensation  Distal Pulses Normal Normal    Tests Performed/Ordered:   - Left knee MRI without contrast = evaluate for  internal derangement of the knee causing her chronic pains have not improved with conservative treatment  Tests Previously Reviewed:  EXAM: Left Knee Radiographs - 3 views (Bilateral AP, Lateral, Bilateral Sunrise) performed 08/22/2023   CLINICAL INFORMATION: Acute left knee pain   COMPARISON: None   FINDINGS:   Valgus tibiofemoral alignment.  Normal patellofemoral alignment.  No fractures or dislocations.  No soft tissue swelling or joint effusion.  Mostly patellofemoral arthritis change that is moderate with joint space narrowing, osteophyte formation, irregularity to the articular surface of the medial patellofemoral joint.  No loose bodies.  No abnormal bone lesions.  I personally reviewed and visualized the imaging studies if available.   Assessment:     ICD-10-CM  1. Chronic pain of left knee  M25.562   G89.29  2. Patellofemoral arthritis of left knee  M17.12     Plan:   I have discussed the nature of her current subjective complaints, clinical examination, test results and have reviewed treatment options.  The plan is to do the following;  - The patient has ongoing left knee pain suspected to be due to patellofemoral dysfunction and underlying patellofemoral arthritis.  She could possibly have some internal derangement of the knee from degenerative meniscus tear versus articular cartilage pathology versus referred pain. - She has still has no major abnormalities on physical exam.  Previous x-ray shows moderate patellofemoral arthritis change.  She has gotten short-term benefit in the past from intra-articular knee joint steroid injections but her symptoms will recur.  I explained the conservative treatment options with activity modification, home exercise, physical therapy, medication, topical pain cream, bracing, steroid injection, viscosupplementation versus further evaluation with MRI.  Since she is only got short-term benefit from intra-articular knee joint steroid injections,  I recommended further evaluation with MRI. - Daily activity as tolerated. Modify activity as needed according to symptoms. No limitations to weight bearing.  She can use a knee brace as needed for comfort. - Use Tylenol , anti-inflammatories, topical diclofenac/pain cream, relative rest, elevation, compression, massage, and ice/heat as needed for pain.   - Follow up for MRI review.  If she has underlying surgical pathology, then she can follow-up directly with the surgeon.  Contact our office with any questions or concerns.  Follow up as indicated, or sooner should any new problems arise, if conditions worsen, or if they are otherwise concerned.    Prentice Reges, DO [facility] [address] [place], [place] [address] Phone: [phone]   This note was generated in part with voice recognition software and I apologize for any typographical errors that were not detected and corrected.

## 2024-02-12 NOTE — Therapy (Addendum)
 OCCUPATIONAL THERAPY NEURO TREATMENT NOTE  Patient Name: Ashley Collins MRN: [id number] DOB:09-May-1961, 63 y.o., female Today's Date: 02/12/2024  PCP: Harvey Gaetana CROME, NP REFERRING PROVIDER: Harvey Gaetana CROME, NP   OT End of Session - 02/12/24 2143     Visit Number 14    Number of Visits 24    Date for OT Re-Evaluation 03/20/24    OT Start Time 1315    OT Stop Time 1400    OT Time Calculation (min) 45 min    Activity Tolerance Patient tolerated treatment well    Behavior During Therapy WFL for tasks assessed/performed             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   Cardiac arrhythmia    Nonspecific ST T wave changes on EKG   Chronic venous insufficiency of lower extremity    Complication of anesthesia    nausea and vomiting   DDD (degenerative disc disease), lumbosacral    Essential hypertension    Headache    History of kidney stones    Hyperlipidemia    Kidney stones    Lymphedema    Migraines    Osteoporosis    PONV (postoperative nausea and vomiting)    Right ureteral stone    Vitamin B12 deficiency    Vitamin D deficiency    Past Surgical History:  Procedure Laterality Date   AUGMENTATION MAMMAPLASTY     CESAREAN SECTION     x 4   COLONOSCOPY WITH PROPOFOL  N/A 03/31/2022   Procedure: COLONOSCOPY WITH PROPOFOL ;  Surgeon: Onita Elspeth Sharper, DO;  Location: [facility] ENDOSCOPY;  Service: Gastroenterology;  Laterality: N/A;   CYSTOSCOPY W/ RETROGRADES Bilateral 07/10/2020   Procedure: CYSTOSCOPY WITH RETROGRADE PYELOGRAM;  Surgeon: Francisca Redell BROCKS, MD;  Location: [facility] ORS;  Service: Urology;  Laterality: Bilateral;   CYSTOSCOPY/URETEROSCOPY/HOLMIUM LASER/STENT PLACEMENT     CYSTOSCOPY/URETEROSCOPY/HOLMIUM LASER/STENT PLACEMENT Bilateral 07/10/2020   Procedure: CYSTOSCOPY/URETEROSCOPY/HOLMIUM LASER/STENT PLACEMENT;  Surgeon: Francisca Redell BROCKS, MD;  Location: [facility] ORS;  Service: Urology;   Laterality: Bilateral;   CYSTOSCOPY/URETEROSCOPY/HOLMIUM LASER/STENT PLACEMENT Right 08/25/2023   Procedure: CYSTOSCOPY/URETEROSCOPY/HOLMIUM LASER;  Surgeon: Francisca Redell BROCKS, MD;  Location: [facility] ORS;  Service: Urology;  Laterality: Right;   CYSTOSCOPY/URETEROSCOPY/HOLMIUM LASER/STENT PLACEMENT Right 12/15/2023   Procedure: CYSTOSCOPY/URETEROSCOPY/HOLMIUM LASER;  Surgeon: Francisca Redell BROCKS, MD;  Location: [facility] ORS;  Service: Urology;  Laterality: Right;   EXTRACORPOREAL SHOCK WAVE LITHOTRIPSY     x 10 plus   Eye Lift     FACIAL COSMETIC SURGERY     GANGLION CYST EXCISION Right 12/21/2021   Procedure: REMOVAL GANGLION OF WRIST;  Surgeon: Kathlynn Sharper, MD;  Location: [facility] ORS;  Service: Orthopedics;  Laterality: Right;   LIPOSUCTION     TONSILLECTOMY     TRIGGER FINGER RELEASE Right 12/21/2021   Procedure: RELEASE TRIGGER FINGER/A-1 PULLEY;  Surgeon: Kathlynn Sharper, MD;  Location: [facility] ORS;  Service: Orthopedics;  Laterality: Right;   URETEROSCOPY WITH HOLMIUM LASER LITHOTRIPSY     Patient Active Problem List   Diagnosis Date Noted   Acute CVA (cerebrovascular accident) (HCC) 12/19/2023   Hypokalemia 12/19/2023   AKI (acute kidney injury) (HCC) 12/19/2023   Leukocytosis 12/19/2023   Hyperlipidemia, unspecified 12/19/2023   Essential hypertension 12/19/2023   Vision changes 12/19/2023   Lymphedema 05/22/2023   Chronic venous insufficiency 05/22/2023   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 replacement therapy 04/10/2023   Insomnia due  to medical condition 04/25/2022   GAD (generalized anxiety disorder) 02/02/2022   Other specified depressive episodes 02/02/2022   Long-term current use of benzodiazepine 02/02/2022   Migraine with aura and without status migrainosus, not intractable 11/03/2020   Arrhythmia 08/06/2020   Status migrainosus 02/25/2019   Osteoporosis 04/09/2013   Vitamin D deficiency 04/09/2013   DDD (degenerative disc disease), lumbosacral 02/08/2005   ONSET DATE:  12/18/2023  REFERRING DIAG:   THERAPY DIAG:  Low vision, both eyes  Rationale for Evaluation and Treatment: Rehabilitation  SUBJECTIVE:  SUBJECTIVE STATEMENT: Pt reports that she can see words more clearly with her reading glasses. Pt accompanied by: significant other  PERTINENT HISTORY: Pt. has Hx of stroke with onset 12/18/2023. Pt. PMHx includes: HTN, Hyperlipidemia, Kidney Stones, near syncopal event, loss of vision.  PRECAUTIONS: None  WEIGHT BEARING RESTRICTIONS:  PAIN:  Are you having pain? No  FALLS: Has patient fallen in last 6 months? Yes. Number of falls    LIVING ENVIRONMENT: Lives with: lives with their family Lives in: [place], [place] Stairs: No, inside the house, yes but does not use Has following equipment at home:   PLOF: Independent  PATIENT GOALS: To be able to see  OBJECTIVE:  Note: Objective measures were completed at Evaluation unless otherwise noted.  HAND DOMINANCE: Right  ADLs:  Eating: Drinking from straw is different, able to use utensils.  Grooming: Fatigues completing hair care.  UB Dressing: independent, if clothes are in front of her she can find clothing LB Dressing: Independent Toileting: Independent Bathing: Independent Tub Shower transfers: Independent Equipment: none Has difficulty with using cell phone  IADLs: Shopping: Does not typically go out shopping, and has not tried to. Light housekeeping: Pt. reports that she does not typically do house cleaning. Pt. has been able to unpack belongings from boxes due to her recent move in. Meal Prep: Pt. reports that is does not currently cook, however reports that she probably could. Has difficulty opening bottle caps/lids. Community mobility: Requires assistance from husband to navigate  through buildings, negotiate stairs-Pt. With increased fear of falling  Medication management: Able to push down medication bottles to open  Financial management: No change in the process-uses  automatic bill pay system.  Has difficulty with using cell phone Handwriting: N/T Work: Pt. was actively working managing a Health visitor, works a lot of hours  MOBILITY STATUS: Independent and Needs Assist: Requires hand on hand assistance with nvigating through environments.   POSTURE COMMENTS:  No Significant postural limitations Sitting balance: Good  ACTIVITY TOLERANCE: Activity tolerance: Good  FUNCTIONAL OUTCOME MEASURES:   UPPER EXTREMITY ROM:    Active ROM Right Eval WFL Left Eval [facility]  Shoulder flexion    Shoulder abduction    Shoulder adduction    Shoulder extension    Shoulder internal rotation    Shoulder external rotation    Elbow flexion    Elbow extension    Wrist flexion    Wrist extension    Wrist ulnar deviation    Wrist radial deviation    Wrist pronation    Wrist supination    (Blank rows = not tested)  UPPER EXTREMITY MMT:     MMT Right eval  Left eval   Shoulder flexion 4-/5  4-/5   Shoulder abduction 4-/5  4-/5   Shoulder adduction      Shoulder extension      Shoulder internal rotation      Shoulder external rotation      Middle  trapezius      Lower trapezius      Elbow flexion 5/5  5/5   Elbow extension 5/5  5/5   Wrist flexion 4/5  4-/5   Wrist extension 4/5  4-/5   Wrist ulnar deviation      Wrist radial deviation      Wrist pronation      Wrist supination      (Blank rows = not tested)  HAND FUNCTION: Grip strength: Right: 39 lbs; Left: 28 lbs, Lateral pinch: Right: 11 lbs, Left: 9 lbs, and 3 point pinch: Right: 9 lbs, Left: 9 lbs  01/29/24 Grip strength: Right: 39 lbs; Left: 36 lbs, Lateral pinch: Right: 14 lbs, Left: 12 lbs, and 3 point pinch: Right: 12 lbs, Left: 11 lbs   COORDINATION: 9 Hole Peg test: Right: 30 sec; Left: 36 sec  01/29/24 9 Hole Peg test: Right: 24 sec; Left: 23 sec   SENSATION: WFL  EDEMA:   MUSCLE TONE:   COGNITION: Overall cognitive status: Within functional limits for tasks  assessed  VISION: Subjective report: Pt. Reports changes in her vision at onset of stroke, starting with blurry vision, resulting in vision loss in the R eye.  Baseline vision: Pt. Is able to visually track  Visual history: Hx of eye surgery  VISION ASSESSMENT: TBD  PERCEPTION: WFL  PRAXIS: WFL  OBSERVATIONS:                                                                                                                    TREATMENT DATE: 02/12/2024   Therapeutic Activities:   -Facilitated visual scanning and visual search strategies for tabletop tasks using hidden picture  picture forms of progressively increasing complexity with increased time required to locate each item. -Facilitated visual memory skills working to recall the list of picture items that were that were searched for in the first hidden picture visual search task.   PATIENT EDUCATION: Education details: Condition management/visual compensation strategies Person educated: Patient and Spouse Education method: Explanation and Verbal cues Education comprehension: verbalized understanding and returned demonstration  HOME EXERCISE PROGRAM:  -visual scanning tasks at the tabletop. -Visual memory tasks using playing cards, and gradually increasing the number of pairs.   GOALS: Goals reviewed with patient? Yes  SHORT TERM GOALS: Target date: 02/06/2024    Pt. Will independently utilize HEP for hand strength, coordination, and visual compensatory strategies ADL/ADLs Baseline: Eval: No  current HEP  Goal status: INITIAL   LONG TERM GOALS: Target date: 03/19/2024    Pt. Will be able to independently implement visual scanning/visual search compensatory strategies for tasks within her extra personal space navigating through community environments 100% of the time.  Baseline: 01/29/24:  Independent  100% of the time within the therapy gym, and hallway. Eval: Pt. Requires increased assist to navigate through community  environments. Goal status: Achieved  2.  Pt. Will be able to independently utilize visual scanning/visual search strategies  during ADLS/IADL within her near space, and during tabletop top  tasks. 100% of the time.  Baseline: 01/29/24: 75% Eval: Pt. Education to be provided.  Goal status: INITIAL  3.  Pt. Will be able to independently initiate visual scanning techniques in her own environment to reduce risk of falls.  Baseline: 01/29/24: Education was provided, Pt. Is utlizing visual compensatory strategies/visual scanning with in her home. Eval: Pt. Education to be provided.  Goal status: Achieved  4.  Pt. Will increase BUE Grip Strength by 5# to be able to independently hold objects for ADL/IADL use. Baseline: 01/29/24: Grip strength: Right: 39 lbs; Left: 28 lbs, Eval: Right Grip: 39#, Left Grip: 28# Goal status: Achieved  5.  Pt. Will increase L Lateral Pinch strength by 2# to be able to independently twist off bottle caps/lids. Baseline: 01/29/24: Lateral pinch: Right: 14 lbs, Left: 12 lbs Eval: Lateral pinch: Right: 11 lbs, Left: 9 lbs  Goal status: Achieved  6.  Pt. Will increase BUE strength for shoulder flexion/abduction by 2 mm grades to independently complete hair care tasks. Baseline: 01/29/24: 5/5 overall Eval: Right Shoulder Flexion: 4-/5, Left Shoulder Flexion:4-/5, Right Shoulder Abduction: 4-/5, Left Shoulder Abduction: 4-/5 Goal status: Achieved  7.  Pt will be indep with medication set up using weekly pill organizer.  Baseline: 01/29/24: Independent 01/15/24: Daughter currently manages medication set up.  Goal status: Achieved  8. Pt will increase typing speed to 20-30 words per minute with at least 90% accuracy to work towards more efficient typing for job related  responsibilities.  Baseline: 01/29/24: Continue 01/15/24: 8wpm x88% accuracy=7 wpm  Goal status: Achieved  9. Pt will be able to scan small print on store receipts to check for errors with 100% accuracy using  visual compensation strategies as needed.  Baseline: 01/29/24: Pt. Continues to have difficulty scanning small print items. 01/15/24: Not yet attempted; required for return to work/job responsibility  Goal status:  Ongoing  10. Pt. Will independently identify 8 items consistently form visual visual memory in preparation or ADLs.    Baseline: 01/29/24: Pt. Is able to identify 6 items consistently from visual memory.    Gaol status: New ASSESSMENT: CLINICAL IMPRESSION:  Pt. Is improving with visual search, and visual scanning tasks. Pt. had difficulty with more complex visual search tasks requiring increased time to complete them. Pt. was able to recall, and identify 3 items from the 1st hidden picture task, and was able to recall 2 additional items with a visual, and verbal cues. Pt. was unable to to recall the remaining items with both visual, and verbal cues. Pt. continues to benefit from Occupational Therapy services to improve her ability to use visual compensatory strategies, and improve overall BUE functioning in order to improve engagement in, and maximize overall independence with ADL, and IADL tasks.  PERFORMANCE DEFICITS: in functional skills including ADLs, IADLs, coordination, dexterity, Fine motor control, and vision, and psychosocial skills including coping strategies, environmental adaptation, habits, interpersonal interactions, and routines and behaviors.   IMPAIRMENTS: are limiting patient from ADLs, IADLs, rest and sleep, work, leisure, and social participation.   CO-MORBIDITIES: may have co-morbidities  that affects occupational performance. Patient will benefit from skilled OT to address above impairments and improve overall function.  MODIFICATION OR ASSISTANCE TO COMPLETE EVALUATION: Min-Moderate modification of tasks or assist with assess necessary to complete an evaluation.  OT OCCUPATIONAL PROFILE AND HISTORY: Detailed assessment: Review of records and additional review of  physical, cognitive, psychosocial history related to current functional performance.  CLINICAL DECISION MAKING: Moderate - several treatment options, min-mod task  modification necessary  REHAB POTENTIAL: Good  EVALUATION COMPLEXITY: Moderate    PLAN:  OT FREQUENCY: 2x/week  OT DURATION: 12 weeks  PLANNED INTERVENTIONS: 97168 OT Re-evaluation, 97535 self care/ADL training, 02889 therapeutic exercise, 97530 therapeutic activity, 97112 neuromuscular re-education, visual/perceptual remediation/compensation, patient/family education, and DME and/or AE instructions  RECOMMENDED OTHER SERVICES: ST  CONSULTED AND AGREED WITH PLAN OF CARE: Patient and family member/caregiver  PLAN FOR NEXT SESSION: Treatment  Richardson Otter, MS, OTR/L   02/12/24

## 2024-02-13 ENCOUNTER — Other Ambulatory Visit: Payer: Self-pay | Admitting: Sports Medicine

## 2024-02-13 DIAGNOSIS — G8929 Other chronic pain: Secondary | ICD-10-CM

## 2024-02-13 DIAGNOSIS — M1712 Unilateral primary osteoarthritis, left knee: Secondary | ICD-10-CM

## 2024-02-14 ENCOUNTER — Other Ambulatory Visit: Payer: Self-pay | Admitting: Psychiatry

## 2024-02-14 ENCOUNTER — Ambulatory Visit

## 2024-02-14 ENCOUNTER — Ambulatory Visit
Admission: RE | Admit: 2024-02-14 | Discharge: 2024-02-14 | Disposition: A | Discharge status: Home / Self Care | Source: Ambulatory Visit | Attending: Sports Medicine | Admitting: Sports Medicine

## 2024-02-14 ENCOUNTER — Ambulatory Visit: Admitting: Occupational Therapy

## 2024-02-14 DIAGNOSIS — M25562 Pain in left knee: Secondary | ICD-10-CM | POA: Diagnosis present

## 2024-02-14 DIAGNOSIS — M6281 Muscle weakness (generalized): Secondary | ICD-10-CM | POA: Diagnosis not present

## 2024-02-14 DIAGNOSIS — G8929 Other chronic pain: Secondary | ICD-10-CM | POA: Insufficient documentation

## 2024-02-14 DIAGNOSIS — M1712 Unilateral primary osteoarthritis, left knee: Secondary | ICD-10-CM | POA: Diagnosis present

## 2024-02-14 DIAGNOSIS — F411 Generalized anxiety disorder: Secondary | ICD-10-CM

## 2024-02-14 DIAGNOSIS — H543 Unqualified visual loss, both eyes: Secondary | ICD-10-CM

## 2024-02-14 NOTE — Therapy (Addendum)
 OCCUPATIONAL THERAPY NEURO TREATMENT NOTE  Patient Name: Ashley Collins MRN: [id number] DOB:02/08/1961, 63 y.o., female Today's Date: 02/14/2024  PCP: Harvey Gaetana CROME, NP REFERRING PROVIDER: Harvey Gaetana CROME, NP   OT End of Session - 02/14/24 1709     Visit Number 15    Number of Visits 24    Date for OT Re-Evaluation 03/20/24    OT Start Time 1315    OT Stop Time 1400    OT Time Calculation (min) 45 min    Activity Tolerance Patient tolerated treatment well    Behavior During Therapy WFL for tasks assessed/performed             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   Cardiac arrhythmia    Nonspecific ST T wave changes on EKG   Chronic venous insufficiency of lower extremity    Complication of anesthesia    nausea and vomiting   DDD (degenerative disc disease), lumbosacral    Essential hypertension    Headache    History of kidney stones    Hyperlipidemia    Kidney stones    Lymphedema    Migraines    Osteoporosis    PONV (postoperative nausea and vomiting)    Right ureteral stone    Vitamin B12 deficiency    Vitamin D deficiency    Past Surgical History:  Procedure Laterality Date   AUGMENTATION MAMMAPLASTY     CESAREAN SECTION     x 4   COLONOSCOPY WITH PROPOFOL  N/A 03/31/2022   Procedure: COLONOSCOPY WITH PROPOFOL ;  Surgeon: Onita Elspeth Sharper, DO;  Location: [facility] ENDOSCOPY;  Service: Gastroenterology;  Laterality: N/A;   CYSTOSCOPY W/ RETROGRADES Bilateral 07/10/2020   Procedure: CYSTOSCOPY WITH RETROGRADE PYELOGRAM;  Surgeon: Francisca Redell BROCKS, MD;  Location: [facility] ORS;  Service: Urology;  Laterality: Bilateral;   CYSTOSCOPY/URETEROSCOPY/HOLMIUM LASER/STENT PLACEMENT     CYSTOSCOPY/URETEROSCOPY/HOLMIUM LASER/STENT PLACEMENT Bilateral 07/10/2020   Procedure: CYSTOSCOPY/URETEROSCOPY/HOLMIUM LASER/STENT PLACEMENT;  Surgeon: Francisca Redell BROCKS, MD;  Location: [facility] ORS;  Service: Urology;   Laterality: Bilateral;   CYSTOSCOPY/URETEROSCOPY/HOLMIUM LASER/STENT PLACEMENT Right 08/25/2023   Procedure: CYSTOSCOPY/URETEROSCOPY/HOLMIUM LASER;  Surgeon: Francisca Redell BROCKS, MD;  Location: [facility] ORS;  Service: Urology;  Laterality: Right;   CYSTOSCOPY/URETEROSCOPY/HOLMIUM LASER/STENT PLACEMENT Right 12/15/2023   Procedure: CYSTOSCOPY/URETEROSCOPY/HOLMIUM LASER;  Surgeon: Francisca Redell BROCKS, MD;  Location: [facility] ORS;  Service: Urology;  Laterality: Right;   EXTRACORPOREAL SHOCK WAVE LITHOTRIPSY     x 10 plus   Eye Lift     FACIAL COSMETIC SURGERY     GANGLION CYST EXCISION Right 12/21/2021   Procedure: REMOVAL GANGLION OF WRIST;  Surgeon: Kathlynn Sharper, MD;  Location: [facility] ORS;  Service: Orthopedics;  Laterality: Right;   LIPOSUCTION     TONSILLECTOMY     TRIGGER FINGER RELEASE Right 12/21/2021   Procedure: RELEASE TRIGGER FINGER/A-1 PULLEY;  Surgeon: Kathlynn Sharper, MD;  Location: [facility] ORS;  Service: Orthopedics;  Laterality: Right;   URETEROSCOPY WITH HOLMIUM LASER LITHOTRIPSY     Patient Active Problem List   Diagnosis Date Noted   Acute CVA (cerebrovascular accident) (HCC) 12/19/2023   Hypokalemia 12/19/2023   AKI (acute kidney injury) (HCC) 12/19/2023   Leukocytosis 12/19/2023   Hyperlipidemia, unspecified 12/19/2023   Essential hypertension 12/19/2023   Vision changes 12/19/2023   Lymphedema 05/22/2023   Chronic venous insufficiency 05/22/2023   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 replacement therapy 04/10/2023   Insomnia due  to medical condition 04/25/2022   GAD (generalized anxiety disorder) 02/02/2022   Other specified depressive episodes 02/02/2022   Long-term current use of benzodiazepine 02/02/2022   Migraine with aura and without status migrainosus, not intractable 11/03/2020   Arrhythmia 08/06/2020   Status migrainosus 02/25/2019   Osteoporosis 04/09/2013   Vitamin D deficiency 04/09/2013   DDD (degenerative disc disease), lumbosacral 02/08/2005   ONSET DATE:  12/18/2023  REFERRING DIAG:   THERAPY DIAG:  Low vision, both eyes  Rationale for Evaluation and Treatment: Rehabilitation  SUBJECTIVE:  SUBJECTIVE STATEMENT: Pt reports that she has had a headache yesterday. Pt accompanied by: significant other  PERTINENT HISTORY: Pt. has Hx of stroke with onset 12/18/2023. Pt. PMHx includes: HTN, Hyperlipidemia, Kidney Stones, near syncopal event, loss of vision.  PRECAUTIONS: None  WEIGHT BEARING RESTRICTIONS:  PAIN:  Are you having pain? No  FALLS: Has patient fallen in last 6 months? Yes. Number of falls    LIVING ENVIRONMENT: Lives with: lives with their family Lives in: [place], [place] Stairs: No, inside the house, yes but does not use Has following equipment at home:   PLOF: Independent  PATIENT GOALS: To be able to see  OBJECTIVE:  Note: Objective measures were completed at Evaluation unless otherwise noted.  HAND DOMINANCE: Right  ADLs:  Eating: Drinking from straw is different, able to use utensils.  Grooming: Fatigues completing hair care.  UB Dressing: independent, if clothes are in front of her she can find clothing LB Dressing: Independent Toileting: Independent Bathing: Independent Tub Shower transfers: Independent Equipment: none Has difficulty with using cell phone  IADLs: Shopping: Does not typically go out shopping, and has not tried to. Light housekeeping: Pt. reports that she does not typically do house cleaning. Pt. has been able to unpack belongings from boxes due to her recent move in. Meal Prep: Pt. reports that is does not currently cook, however reports that she probably could. Has difficulty opening bottle caps/lids. Community mobility: Requires assistance from husband to navigate  through buildings, negotiate stairs-Pt. With increased fear of falling  Medication management: Able to push down medication bottles to open  Financial management: No change in the process-uses automatic bill pay system.   Has difficulty with using cell phone Handwriting: N/T Work: Pt. was actively working managing a Health visitor, works a lot of hours  MOBILITY STATUS: Independent and Needs Assist: Requires hand on hand assistance with nvigating through environments.   POSTURE COMMENTS:  No Significant postural limitations Sitting balance: Good  ACTIVITY TOLERANCE: Activity tolerance: Good  FUNCTIONAL OUTCOME MEASURES:   UPPER EXTREMITY ROM:    Active ROM Right Eval WFL Left Eval [facility]  Shoulder flexion    Shoulder abduction    Shoulder adduction    Shoulder extension    Shoulder internal rotation    Shoulder external rotation    Elbow flexion    Elbow extension    Wrist flexion    Wrist extension    Wrist ulnar deviation    Wrist radial deviation    Wrist pronation    Wrist supination    (Blank rows = not tested)  UPPER EXTREMITY MMT:     MMT Right eval  Left eval   Shoulder flexion 4-/5  4-/5   Shoulder abduction 4-/5  4-/5   Shoulder adduction      Shoulder extension      Shoulder internal rotation      Shoulder external rotation      Middle trapezius  Lower trapezius      Elbow flexion 5/5  5/5   Elbow extension 5/5  5/5   Wrist flexion 4/5  4-/5   Wrist extension 4/5  4-/5   Wrist ulnar deviation      Wrist radial deviation      Wrist pronation      Wrist supination      (Blank rows = not tested)  HAND FUNCTION: Grip strength: Right: 39 lbs; Left: 28 lbs, Lateral pinch: Right: 11 lbs, Left: 9 lbs, and 3 point pinch: Right: 9 lbs, Left: 9 lbs  01/29/24 Grip strength: Right: 39 lbs; Left: 36 lbs, Lateral pinch: Right: 14 lbs, Left: 12 lbs, and 3 point pinch: Right: 12 lbs, Left: 11 lbs   COORDINATION: 9 Hole Peg test: Right: 30 sec; Left: 36 sec  01/29/24 9 Hole Peg test: Right: 24 sec; Left: 23 sec   SENSATION: WFL  EDEMA:   MUSCLE TONE:   COGNITION: Overall cognitive status: Within functional limits for tasks  assessed  VISION: Subjective report: Pt. Reports changes in her vision at onset of stroke, starting with blurry vision, resulting in vision loss in the R eye.  Baseline vision: Pt. Is able to visually track  Visual history: Hx of eye surgery  VISION ASSESSMENT: TBD  PERCEPTION: WFL  PRAXIS: WFL  OBSERVATIONS:                                                                                                                    TREATMENT DATE: 02/14/2024   Therapeutic Activities:   -facilitated visual scanning using the line bisection task. Pt. Was able to accurately bisect 8/17 lines at midline of the line. -Facilitated visual perceptual visual motor skills copying a progression of simple  progressively more complex images with emphasis on refining the details of each image. Pt. Worked on tracing each image prior to copying them requiring consistent cueing for accuracy.  -Pt. Worked on identifying detail omissions for each image requiring cues. -Pt. Worked on writing her full name, and date on each of the pages on a small designated line to promote smaller writing letter size.     PATIENT EDUCATION: Education details: Condition management/visual compensation strategies Person educated: Patient and Spouse Education method: Explanation and Verbal cues Education comprehension: verbalized understanding and returned demonstration  HOME EXERCISE PROGRAM:  -visual scanning tasks at the tabletop. -[employer] tasks.   GOALS: Goals reviewed with patient? Yes  SHORT TERM GOALS: Target date: 02/06/2024    Pt. Will independently utilize HEP for hand strength, coordination, and visual compensatory strategies ADL/ADLs Baseline: Eval: No  current HEP  Goal status: INITIAL   LONG TERM GOALS: Target date: 03/19/2024    Pt. Will be able to independently implement visual scanning/visual search compensatory strategies for tasks within her extra personal space navigating through community  environments 100% of the time.  Baseline: 01/29/24:  Independent  100% of the time within the therapy gym, and hallway. Eval: Pt. Requires increased assist to navigate through  community environments. Goal status: Achieved  2.  Pt. Will be able to independently utilize visual scanning/visual search strategies  during ADLS/IADL within her near space, and during tabletop top tasks. 100% of the time.  Baseline: 01/29/24: 75% Eval: Pt. Education to be provided.  Goal status: INITIAL  3.  Pt. Will be able to independently initiate visual scanning techniques in her own environment to reduce risk of falls.  Baseline: 01/29/24: Education was provided, Pt. Is utlizing visual compensatory strategies/visual scanning with in her home. Eval: Pt. Education to be provided.  Goal status: Achieved  4.  Pt. Will increase BUE Grip Strength by 5# to be able to independently hold objects for ADL/IADL use. Baseline: 01/29/24: Grip strength: Right: 39 lbs; Left: 28 lbs, Eval: Right Grip: 39#, Left Grip: 28# Goal status: Achieved  5.  Pt. Will increase L Lateral Pinch strength by 2# to be able to independently twist off bottle caps/lids. Baseline: 01/29/24: Lateral pinch: Right: 14 lbs, Left: 12 lbs Eval: Lateral pinch: Right: 11 lbs, Left: 9 lbs  Goal status: Achieved  6.  Pt. Will increase BUE strength for shoulder flexion/abduction by 2 mm grades to independently complete hair care tasks. Baseline: 01/29/24: 5/5 overall Eval: Right Shoulder Flexion: 4-/5, Left Shoulder Flexion:4-/5, Right Shoulder Abduction: 4-/5, Left Shoulder Abduction: 4-/5 Goal status: Achieved  7.  Pt will be indep with medication set up using weekly pill organizer.  Baseline: 01/29/24: Independent 01/15/24: Daughter currently manages medication set up.  Goal status: Achieved  8. Pt will increase typing speed to 20-30 words per minute with at least 90% accuracy to work towards more efficient typing for job related  responsibilities.  Baseline:  01/29/24: Continue 01/15/24: 8wpm x88% accuracy=7 wpm  Goal status: Achieved  9. Pt will be able to scan small print on store receipts to check for errors with 100% accuracy using visual compensation strategies as needed.  Baseline: 01/29/24: Pt. Continues to have difficulty scanning small print items. 01/15/24: Not yet attempted; required for return to work/job responsibility  Goal status:  Ongoing  10. Pt. Will independently identify 8 items consistently form visual visual memory in preparation or ADLs.    Baseline: 01/29/24: Pt. Is able to identify 6 items consistently from visual memory.    [employer]ol status: New ASSESSMENT: CLINICAL IMPRESSION:  Pt. was able to accurately bisect 8/17 lines at midline on the Line Bisect task. Pt. presented with difficulty completing visual motor design copying tasks. Pt. required consistent cues, and assist to complete simple/moderately complex images with attention to the detail. Initially presented with difficulty fitting her whole name on a designated line, however with cues was able to eventually fit her full name in the space. Although straight, Pt. presented with slight deviation consistently above the line. Pt. continues to benefit from Occupational Therapy services to improve her ability to use visual compensatory strategies, and improve overall BUE functioning in order to improve engagement in, and maximize overall independence with ADL, and IADL tasks.  PERFORMANCE DEFICITS: in functional skills including ADLs, IADLs, coordination, dexterity, Fine motor control, and vision, and psychosocial skills including coping strategies, environmental adaptation, habits, interpersonal interactions, and routines and behaviors.   IMPAIRMENTS: are limiting patient from ADLs, IADLs, rest and sleep, work, leisure, and social participation.   CO-MORBIDITIES: may have co-morbidities  that affects occupational performance. Patient will benefit from skilled OT to address above  impairments and improve overall function.  MODIFICATION OR ASSISTANCE TO COMPLETE EVALUATION: Min-Moderate modification of tasks or assist with assess necessary  to complete an evaluation.  OT OCCUPATIONAL PROFILE AND HISTORY: Detailed assessment: Review of records and additional review of physical, cognitive, psychosocial history related to current functional performance.  CLINICAL DECISION MAKING: Moderate - several treatment options, min-mod task modification necessary  REHAB POTENTIAL: Good  EVALUATION COMPLEXITY: Moderate    PLAN:  OT FREQUENCY: 2x/week  OT DURATION: 12 weeks  PLANNED INTERVENTIONS: 97168 OT Re-evaluation, 97535 self care/ADL training, 02889 therapeutic exercise, 97530 therapeutic activity, 97112 neuromuscular re-education, visual/perceptual remediation/compensation, patient/family education, and DME and/or AE instructions  RECOMMENDED OTHER SERVICES: ST  CONSULTED AND AGREED WITH PLAN OF CARE: Patient and family member/caregiver  PLAN FOR NEXT SESSION: Treatment  Richardson Otter, MS, OTR/L   02/14/24

## 2024-02-20 ENCOUNTER — Ambulatory Visit: Admitting: Occupational Therapy

## 2024-02-20 ENCOUNTER — Ambulatory Visit

## 2024-02-20 DIAGNOSIS — H543 Unqualified visual loss, both eyes: Secondary | ICD-10-CM

## 2024-02-20 DIAGNOSIS — R262 Difficulty in walking, not elsewhere classified: Secondary | ICD-10-CM

## 2024-02-20 DIAGNOSIS — R41841 Cognitive communication deficit: Secondary | ICD-10-CM

## 2024-02-20 DIAGNOSIS — M6281 Muscle weakness (generalized): Secondary | ICD-10-CM | POA: Diagnosis not present

## 2024-02-20 NOTE — Therapy (Signed)
 OCCUPATIONAL THERAPY NEURO TREATMENT NOTE  Patient Name: Ashley Collins MRN: [id number] DOB:May 02, 1961, 63 y.o., female Today's Date: 02/20/2024  PCP: Harvey Gaetana CROME, NP REFERRING PROVIDER: Harvey Gaetana CROME, NP   OT End of Session - 02/20/24 2234     Visit Number 16    Number of Visits 24    Date for OT Re-Evaluation 03/20/24    OT Start Time 1400    OT Stop Time 1445    OT Time Calculation (min) 45 min    Activity Tolerance Patient tolerated treatment well    Behavior During Therapy WFL for tasks assessed/performed             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   Cardiac arrhythmia    Nonspecific ST T wave changes on EKG   Chronic venous insufficiency of lower extremity    Complication of anesthesia    nausea and vomiting   DDD (degenerative disc disease), lumbosacral    Essential hypertension    Headache    History of kidney stones    Hyperlipidemia    Kidney stones    Lymphedema    Migraines    Osteoporosis    PONV (postoperative nausea and vomiting)    Right ureteral stone    Vitamin B12 deficiency    Vitamin D deficiency    Past Surgical History:  Procedure Laterality Date   AUGMENTATION MAMMAPLASTY     CESAREAN SECTION     x 4   COLONOSCOPY WITH PROPOFOL  N/A 03/31/2022   Procedure: COLONOSCOPY WITH PROPOFOL ;  Surgeon: Onita Elspeth Sharper, DO;  Location: [facility] END[facility]COPY;  Service: Gastroenterology;  Laterality: N/A;   CYSTOSCOPY W/ RETROGRADES Bilateral 07/10/2020   Procedure: CYSTOSCOPY WITH RETROGRADE PYELOGRAM;  Surgeon: Francisca Redell BROCKS, MD;  Location: [facility] ORS;  Service: Urology;  Laterality: Bilateral;   CYSTOSCOPY/URETEROSCOPY/HOLMIUM LASER/STENT PLACEMENT     CYSTOSCOPY/URETEROSCOPY/HOLMIUM LASER/STENT PLACEMENT Bilateral 07/10/2020   Procedure: CYSTOSCOPY/URETEROSCOPY/HOLMIUM LASER/STENT PLACEMENT;  Surgeon: Francisca Redell BROCKS, MD;  Location: [facility] ORS;  Service: Urology;   Laterality: Bilateral;   CYSTOSCOPY/URETEROSCOPY/HOLMIUM LASER/STENT PLACEMENT Right 08/25/2023   Procedure: CYSTOSCOPY/URETEROSCOPY/HOLMIUM LASER;  Surgeon: Francisca Redell BROCKS, MD;  Location: [facility];  Service: Urology;  Laterality: Right;   CYSTOSCOPY/URETEROSCOPY/HOLMIUM LASER/STENT PLACEMENT Right 12/15/2023   Procedure: CYSTOSCOPY/URETEROSCOPY/HOLMIUM LASER;  Surgeon: Francisca Redell BROCKS, MD;  Location: [facility] OR[facility];  Service: Urology;  Laterality: Right;   EXTRACORPOREAL SHOCK WAVE LITHOTRIPSY     x 10 plus   Eye Lift     FACIAL COSMETIC SURGERY     GANGLION CYST EXCISION Right 12/21/2021   Procedure: REMOVAL GANGLION OF WRIST;  Surgeon: Kathlynn Sharper, MD;  Location: [facility] OR[facility];  Service: Orthopedics;  Laterality: Right;   LIPOSUCTION     TONSILLECTOMY     TRIGGER FINGER RELEASE Right 12/21/2021   Procedure: RELEASE TRIGGER FINGER/A-1 PULLEY;  Surgeon: Kathlynn Sharper, MD;  Location: [facility] ORS;  Service: Orthopedics;  Laterality: Right;   URETEROSCOPY WITH HOLMIUM LASER LITHOTRIPSY     Patient Active Problem List   Diagnosis Date Noted   Acute CVA (cerebrovascular accident) (HCC) 12/19/2023   Hypokalemia 12/19/2023   AKI (acute kidney injury) (HCC) 12/19/2023   Leukocytosis 12/19/2023   Hyperlipidemia, unspecified 12/19/2023   Essential hypertension 12/19/2023   Vision changes 12/19/2023   Lymphedema 05/22/2023   Chronic venous insufficiency 05/22/2023   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 replacement therapy 04/10/2023   Insomnia due  to medical condition 04/25/2022   GAD (generalized anxiety disorder) 02/02/2022   Other specified depressive episodes 02/02/2022   Long-term current use of benzodiazepine 02/02/2022   Migraine with aura and without status migrainosus, not intractable 11/03/2020   Arrhythmia 08/06/2020   Status migrainosus 02/25/2019   Osteoporosis 04/09/2013   Vitamin D deficiency 04/09/2013   DDD (degenerative disc disease), lumbosacral 02/08/2005   ONSET DATE:  12/18/2023  REFERRING DIAG:   THERAPY DIAG:  Low vision, both eyes  Rationale for Evaluation and Treatment: Rehabilitation  SUBJECTIVE:  SUBJECTIVE STATEMENT: Pt. has a speech therapy evaluation this afternoon. Pt accompanied by: significant other  PERTINENT HISTORY: Pt. has Hx of stroke with onset 12/18/2023. Pt. PMHx includes: HTN, Hyperlipidemia, Kidney Stones, near syncopal event, loss of vision.  PRECAUTIONS: None  WEIGHT BEARING RESTRICTIONS:  PAIN:  Are you having pain? No  FALLS: Has patient fallen in last 6 months? Yes. Number of falls    LIVING ENVIRONMENT: Lives with: lives with their family Lives in: [place], [place] Stairs: No, inside the house, yes but does not use Has following equipment at home:   PLOF: Independent  PATIENT GOALS: To be able to see  OBJECTIVE:  Note: Objective measures were completed at Evaluation unless otherwise noted.  HAND DOMINANCE: Right  ADLs:  Eating: Drinking from straw is different, able to use utensils.  Grooming: Fatigues completing hair care.  UB Dressing: independent, if clothes are in front of her she can find clothing LB Dressing: Independent Toileting: Independent Bathing: Independent Tub Shower transfers: Independent Equipment: none Has difficulty with using cell phone  IADLs: Shopping: Does not typically go out shopping, and has not tried to. Light housekeeping: Pt. reports that she does not typically do house cleaning. Pt. has been able to unpack belongings from boxes due to her recent move in. Meal Prep: Pt. reports that is does not currently cook, however reports that she probably could. Has difficulty opening bottle caps/lids. Community mobility: Requires assistance from husband to navigate  through buildings, negotiate stairs-Pt. With increased fear of falling  Medication management: Able to push down medication bottles to open  Financial management: No change in the process-uses automatic bill pay system.   Has difficulty with using cell phone Handwriting: N/T Work: Pt. was actively working managing a Health visitor, works a lot of hours  MOBILITY STATUS: Independent and Needs Assist: Requires hand on hand assistance with nvigating through environments.   POSTURE COMMENTS:  No Significant postural limitations Sitting balance: Good  ACTIVITY TOLERANCE: Activity tolerance: Good  FUNCTIONAL OUTCOME MEASURES:   UPPER EXTREMITY ROM:    Active ROM Right Eval WFL Left Eval [facility]  Shoulder flexion    Shoulder abduction    Shoulder adduction    Shoulder extension    Shoulder internal rotation    Shoulder external rotation    Elbow flexion    Elbow extension    Wrist flexion    Wrist extension    Wrist ulnar deviation    Wrist radial deviation    Wrist pronation    Wrist supination    (Blank rows = not tested)  UPPER EXTREMITY MMT:     MMT Right eval  Left eval   Shoulder flexion 4-/5  4-/5   Shoulder abduction 4-/5  4-/5   Shoulder adduction      Shoulder extension      Shoulder internal rotation      Shoulder external rotation      Middle trapezius  Lower trapezius      Elbow flexion 5/5  5/5   Elbow extension 5/5  5/5   Wrist flexion 4/5  4-/5   Wrist extension 4/5  4-/5   Wrist ulnar deviation      Wrist radial deviation      Wrist pronation      Wrist supination      (Blank rows = not tested)  HAND FUNCTION: Grip strength: Right: 39 lbs; Left: 28 lbs, Lateral pinch: Right: 11 lbs, Left: 9 lbs, and 3 point pinch: Right: 9 lbs, Left: 9 lbs  01/29/24 Grip strength: Right: 39 lbs; Left: 36 lbs, Lateral pinch: Right: 14 lbs, Left: 12 lbs, and 3 point pinch: Right: 12 lbs, Left: 11 lbs   COORDINATION: 9 Hole Peg test: Right: 30 sec; Left: 36 sec  01/29/24 9 Hole Peg test: Right: 24 sec; Left: 23 sec   SENSATION: WFL  EDEMA:   MUSCLE TONE:   COGNITION: Overall cognitive status: Within functional limits for tasks  assessed  VISION: Subjective report: Pt. Reports changes in her vision at onset of stroke, starting with blurry vision, resulting in vision loss in the R eye.  Baseline vision: Pt. Is able to visually track  Visual history: Hx of eye surgery  VISION ASSESSMENT: TBD  PERCEPTION: WFL  PRAXIS: WFL  OBSERVATIONS:                                                                                                                    TREATMENT DATE: 02/20/2024   Therapeutic Activities:   -facilitated visual scanning, and cognitive attention skills navigating through a progression of simple to complex mazes. Pt. focused on reviewing, and organizing each maze visually before using the pen, requiring increased time, assist and cues to navigate through the complex mazes 2/2 decreased memory/attention. Pt. was able to complete the simple, and moderately complex mazes independently, and efficiently.    PATIENT EDUCATION: Education details: Condition management/visual compensation strategies Person educated: Patient and Spouse Education method: Explanation and Verbal cues Education comprehension: verbalized understanding and returned demonstration  HOME EXERCISE PROGRAM:  -visual scanning tasks at the tabletop.    GOALS: Goals reviewed with patient? Yes  SHORT TERM GOALS: Target date: 02/06/2024    Pt. Will independently utilize HEP for hand strength, coordination, and visual compensatory strategies ADL/ADLs Baseline: Eval: No  current HEP  Goal status: INITIAL   LONG TERM GOALS: Target date: 03/19/2024    Pt. Will be able to independently implement visual scanning/visual search compensatory strategies for tasks within her extra personal space navigating through community environments 100% of the time.  Baseline: 01/29/24:  Independent  100% of the time within the therapy gym, and hallway. Eval: Pt. Requires increased assist to navigate through community environments. Goal status:  Achieved  2.  Pt. Will be able to independently utilize visual scanning/visual search strategies  during ADLS/IADL within her near space, and during tabletop top tasks. 100% of the time.  Baseline: 01/29/24: 75% Eval: Pt. Education to  be provided.  Goal status: INITIAL  3.  Pt. Will be able to independently initiate visual scanning techniques in her own environment to reduce risk of falls.  Baseline: 01/29/24: Education was provided, Pt. Is utlizing visual compensatory strategies/visual scanning with in her home. Eval: Pt. Education to be provided.  Goal status: Achieved  4.  Pt. Will increase BUE Grip Strength by 5# to be able to independently hold objects for ADL/IADL use. Baseline: 01/29/24: Grip strength: Right: 39 lbs; Left: 28 lbs, Eval: Right Grip: 39#, Left Grip: 28# Goal status: Achieved  5.  Pt. Will increase L Lateral Pinch strength by 2# to be able to independently twist off bottle caps/lids. Baseline: 01/29/24: Lateral pinch: Right: 14 lbs, Left: 12 lbs Eval: Lateral pinch: Right: 11 lbs, Left: 9 lbs  Goal status: Achieved  6.  Pt. Will increase BUE strength for shoulder flexion/abduction by 2 mm grades to independently complete hair care tasks. Baseline: 01/29/24: 5/5 overall Eval: Right Shoulder Flexion: 4-/5, Left Shoulder Flexion:4-/5, Right Shoulder Abduction: 4-/5, Left Shoulder Abduction: 4-/5 Goal status: Achieved  7.  Pt will be indep with medication set up using weekly pill organizer.  Baseline: 01/29/24: Independent 01/15/24: Daughter currently manages medication set up.  Goal status: Achieved  8. Pt will increase typing speed to 20-30 words per minute with at least 90% accuracy to work towards more efficient typing for job related  responsibilities.  Baseline: 01/29/24: Continue 01/15/24: 8wpm x88% accuracy=7 wpm  Goal status: Achieved  9. Pt will be able to scan small print on store receipts to check for errors with 100% accuracy using visual compensation strategies as  needed.  Baseline: 01/29/24: Pt. Continues to have difficulty scanning small print items. 01/15/24: Not yet attempted; required for return to work/job responsibility  Goal status:  Ongoing  10. Pt. Will independently identify 8 items consistently form visual visual memory in preparation or ADLs.    Baseline: 01/29/24: Pt. Is able to identify 6 items consistently from visual memory.    Gaol status: New ASSESSMENT: CLINICAL IMPRESSION:   Pt. Reports being concerned that her eyes ar changing, as she can see better now without her new glasses. Pt. Reports that she sees better with her individual readers, and distance glasses, however has more difficulty seeing now with her new single pair of glasses.  Pt. Is  able to navigate through simple, and moderately complex mazes, however required mod/max cues, and assist to navigate through 1/2 of the complex mazes at a time in order to be able to complete them. Pt. presented with difficulty remembering the path on 1/2 the complex mazes after visually navigating through them.  Pt. continues to benefit from Occupational Therapy services to improve her ability to use visual compensatory strategies, and improve overall BUE functioning in order to improve engagement in, and maximize overall independence with ADL, and IADL tasks.  PERFORMANCE DEFICITS: in functional skills including ADLs, IADLs, coordination, dexterity, Fine motor control, and vision, and psychosocial skills including coping strategies, environmental adaptation, habits, interpersonal interactions, and routines and behaviors.   IMPAIRMENTS: are limiting patient from ADLs, IADLs, rest and sleep, work, leisure, and social participation.   CO-MORBIDITIES: may have co-morbidities  that affects occupational performance. Patient will benefit from skilled OT to address above impairments and improve overall function.  MODIFICATION OR ASSISTANCE TO COMPLETE EVALUATION: Min-Moderate modification of tasks or  assist with assess necessary to complete an evaluation.  OT OCCUPATIONAL PROFILE AND HISTORY: Detailed assessment: Review of records and additional review of  physical, cognitive, psychosocial history related to current functional performance.  CLINICAL DECISION MAKING: Moderate - several treatment options, min-mod task modification necessary  REHAB POTENTIAL: Good  EVALUATION COMPLEXITY: Moderate    PLAN:  OT FREQUENCY: 2x/week  OT DURATION: 12 weeks  PLANNED INTERVENTIONS: 97168 OT Re-evaluation, 97535 self care/ADL training, 02889 therapeutic exercise, 97530 therapeutic activity, 97112 neuromuscular re-education, visual/perceptual remediation/compensation, patient/family education, and DME and/or AE instructions  RECOMMENDED OTHER SERVICES: ST  CONSULTED AND AGREED WITH PLAN OF CARE: Patient and family member/caregiver  PLAN FOR NEXT SESSION: Treatment  Richardson Otter, MS, OTR/L   02/20/24, 10:44 PM

## 2024-02-20 NOTE — Therapy (Signed)
 OUTPATIENT PHYSICAL THERAPY EVALUATION  Patient Name: Ashley Collins MRN: [id number] DOB:[birth year], 63 y.o., female Today's Date: 02/20/2024  PCP: Harvey Gaetana CROME, NP REFERRING PROVIDER: Harvey Gaetana CROME, NP, Prentice Reges, DO   END OF SESSION:  PT End of Session - 02/20/24 2113     Visit Number 1    Number of Visits 6    Date for PT Re-Evaluation 03/19/24    Authorization Type [employer] HMO/PPO    Authorization Time Period 02/20/24-03/19/24    Progress Note Due on Visit 10    PT Start Time 1315    PT Stop Time 1400    PT Time Calculation (min) 45 min    Activity Tolerance Patient tolerated treatment well;No increased pain    Behavior During Therapy WFL for tasks assessed/performed          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   Cardiac arrhythmia    Nonspecific ST T wave changes on EKG   Chronic venous insufficiency of lower extremity    Complication of anesthesia    nausea and vomiting   DDD (degenerative disc disease), lumbosacral    Essential hypertension    Headache    History of kidney stones    Hyperlipidemia    Kidney stones    Lymphedema    Migraines    Osteoporosis    PONV (postoperative nausea and vomiting)    Right ureteral stone    Vitamin B12 deficiency    Vitamin D deficiency    Past Surgical History:  Procedure Laterality Date   AUGMENTATION MAMMAPLASTY     CESAREAN SECTION     x 4   COLONOSCOPY WITH PROPOFOL  N/A 03/31/2022   Procedure: COLONOSCOPY WITH PROPOFOL ;  Surgeon: Onita Elspeth Sharper, DO;  Location: [facility];  Service: Gastroenterology;  Laterality: N/A;   CYSTOSCOPY W/ RETROGRADES Bilateral 07/10/2020   Procedure: CYSTOSCOPY WITH RETROGRADE PYELOGRAM;  Surgeon: Francisca Redell BROCKS, MD;  Location: [facility] ORS;  Service: Urology;  Laterality: Bilateral;   CYSTOSCOPY/URETEROSCOPY/HOLMIUM LASER/STENT PLACEMENT     CYSTOSCOPY/URETEROSCOPY/HOLMIUM LASER/STENT  PLACEMENT Bilateral 07/10/2020   Procedure: CYSTOSCOPY/URETEROSCOPY/HOLMIUM LASER/STENT PLACEMENT;  Surgeon: Francisca Redell BROCKS, MD;  Location: [facility] ORS;  Service: Urology;  Laterality: Bilateral;   CYSTOSCOPY/URETEROSCOPY/HOLMIUM LASER/STENT PLACEMENT Right 08/25/2023   Procedure: CYSTOSCOPY/URETEROSCOPY/HOLMIUM LASER;  Surgeon: Francisca Redell BROCKS, MD;  Location: [facility] ORS;  Service: Urology;  Laterality: Right;   CYSTOSCOPY/URETEROSCOPY/HOLMIUM LASER/STENT PLACEMENT Right 12/15/2023   Procedure: CYSTOSCOPY/URETEROSCOPY/HOLMIUM LASER;  Surgeon: Francisca Redell BROCKS, MD;  Location: [facility] ORS;  Service: Urology;  Laterality: Right;   EXTRACORPOREAL SHOCK WAVE LITHOTRIPSY     x 10 plus   Eye Lift     FACIAL COSMETIC SURGERY     GANGLION CYST EXCISION Right 12/21/2021   Procedure: REMOVAL GANGLION OF WRIST;  Surgeon: Kathlynn Sharper, MD;  Location: [facility] ORS;  Service: Orthopedics;  Laterality: Right;   LIPOSUCTION     TONSILLECTOMY     TRIGGER FINGER RELEASE Right 12/21/2021   Procedure: RELEASE TRIGGER FINGER/A-1 PULLEY;  Surgeon: Kathlynn Sharper, MD;  Location: [facility] ORS;  Service: Orthopedics;  Laterality: Right;   URETEROSCOPY WITH HOLMIUM LASER LITHOTRIPSY     Patient Active Problem List   Diagnosis Date Noted   Acute CVA (cerebrovascular accident) (HCC) 12/19/2023   Hypokalemia 12/19/2023   AKI (acute kidney injury) (HCC) 12/19/2023   Leukocytosis 12/19/2023   Hyperlipidemia, unspecified 12/19/2023   Essential hypertension  12/19/2023   Vision changes 12/19/2023   Lymphedema 05/22/2023   Chronic venous insufficiency 05/22/2023   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 replacement therapy 04/10/2023   Insomnia due to medical condition 04/25/2022   GAD (generalized anxiety disorder) 02/02/2022   Other specified depressive episodes 02/02/2022   Long-term current use of benzodiazepine 02/02/2022   Migraine with aura and without status migrainosus, not intractable 11/03/2020   Arrhythmia 08/06/2020   Status  migrainosus 02/25/2019   Osteoporosis 04/09/2013   Vitamin D deficiency 04/09/2013   DDD (degenerative disc disease), lumbosacral 02/08/2005   ONSET DATE: July 2025 REFERRING DIAG: s/p CVA; chronic Left knee pain  THERAPY DIAG:  Difficulty in walking, not elsewhere classified  Rationale for Evaluation and Treatment: Rehabilitation  SUBJECTIVE:                                                                                                                                                                                             SUBJECTIVE STATEMENT: Pt reports she continues to have limited confidence in AMB outside of household distances since CVA in July.  Pt accompanied by: significant other  PERTINENT HISTORY:   63yoF who sustained a CVA in July 2025 with visual field loss and memory impairment, now followed by [facility] and [facility]. Pt has been working with OT here since July, OT noted some episodic imbalance and mild cognitive impairment hence recommended referral to OPPT and OPST. PCP sent referrals for these 3rd week of August. Pt also seen by [facility] sports medicine for chronic Left knee pain s/p remote menisectomy, MRI results pending, also sent a referral to PT for Left knee pain. Pt has memory difficulty since CVA which makes it difficult for her to specific particular deficits or difficulty with mobility since CVA, however OT has seen some episodic stumbling and LOB after bending forward to reach. Pt reports limited confidence in balance overall when walking out of house, will hold onto husband, is startled by new visual input in crowded spaces pertaining to visual deficits. 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   PAIN:  Are you having pain? No  PRECAUTIONS: None   WEIGHT BEARING RESTRICTIONS: No  FALLS: Has patient fallen in last 6 months? 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   PATIENT GOALS: Return to work,  full confident mobility/activity.   OBJECTIVE:  Note: Objective measures were completed at Evaluation unless otherwise noted.  DIAGNOSTIC FINDINGS: Left knee MRI results pending.   COGNITION: Overall cognitive status: mild cognitive impairment s/p CVA in July, being seen for visual.cognitive remediation with OT and ST.    SENSATION: No reported deficits, not assessed.   EDEMA:  Mild focal edema to the anterior knee extensor mechanism, non tender, reported as chronic.   KNEE ROM:   0->130 degrees left knee flexion, no pain on inspection.   FUNCTIONAL MOBILITY ASSESSMENT:  1. Overground AMB, shod, level surface: no frank deviation or asymmetry, no LOB, gait speed appropriate to situation, age.   2. : 0.83m/s  3. retro: 0.38m/s (supervision level, no LOB)   4. 30sec chair rise: hands free, standardized height, 11x; no c/o of knee pain, mild asymmetry, mild falls anxiety without LOB  5. SLS balance: RLE: >30sec; LLE: >25sec (minGuardA)   6. Eyes closed balance on foam surface: 60sec, moderately increased sway without frank LOB   7. Curb step up/down (6): 3x each without LOB with modI 180 degree turns   8. Visual scanning and navigation about a congested area in [employer] at lunch time: adequate path navigation and obstacle avoidance.   9. Walking eyes closed x31ft: no LOB, straight line of progression, <25% change in gait speed   [facility] PT Assessment - 02/20/24 0001       Functional Gait  Assessment   Gait assessed  Yes    Gait Level Surface Walks 20 ft in less than 5.5 sec, no assistive devices, good speed, no evidence for imbalance, normal gait pattern, deviates no more than 6 in outside of the 12 in walkway width.     Change in Gait Speed Able to smoothly change walking speed without loss of balance or gait deviation. Deviate no more than 6 in outside of the 12 in walkway width.    Gait with Horizontal Head Turns Performs head turns smoothly with no change in gait. Deviates no more than 6 in outside 12 in walkway width    Gait with Vertical Head Turns Performs head turns with no change in gait. Deviates no more than 6 in outside 12 in walkway width.    Gait and Pivot Turn Pivot turns safely within 3 sec and stops quickly with no loss of balance.    Step Over Obstacle Is able to step over 2 stacked shoe boxes taped together (9 in total height) without changing gait speed. No evidence of imbalance.    Gait with Narrow Base of Support Is able to ambulate for 10 steps heel to toe with no staggering.    Gait with Eyes Closed Walks 20 ft, no assistive devices, good speed, no evidence of imbalance, normal gait pattern, deviates no more than 6 in outside 12 in walkway width. Ambulates 20 ft in less than 7 sec.    Ambulating Backwards Walks 20 ft, no assistive devices, good speed, no evidence for imbalance, normal gait    Steps Alternating feet, no rail.    Total Score 30  TREATMENT DATE 02/20/24:   1. Overground AMB x147ft, shod, level surface, device: no frank deviation, no antalgia, no asymmetry, no LOB, gait speed appropriate to situation, age.  2. : 0.64m/s 3. retro: 0.62m/s (supervision level, no LOB)  4. 30sec chair rise: hands free, standardized height, 11x; no c/o of knee pain, mild asymmetry, mild falls anxiety without LOB 5. SLS balance: RLE: >30sec; LLE: >25sec (minGuardA)  6. Eyes closed balance on foam surface: 60sec, moderately increased sway without frank LOB  7. Curb step up/down (6): 3x each without LOB with modI 180 degree turns  8. Visual scanning and  navigation about a congested area in [employer] at lunch time: adequate path navigation and obstacle avoidance.  9. Walking eyes closed x9ft: no LOB, straight line of progression, <25% change in gait speed 10. 131ft AMB with 5kg load carry to similar baby transport: no LOB, low effort; subjectively more confidence in balance.   PATIENT EDUCATION: Education details: Despite subjective difficulty and deficits from CVA, deficits remain difficult to quantify with available tests and measures. Pt is high level functionally speaking at present and would be likely under stimulated in a typical OPPT setting which is poorly setup for the return-to-community, IADL-based activity reintegration needed to prepare for full return to function and work duties. Recommend slow, progress and scheduled return to community based activities for functional, multi system neuro process, visual scanning, environmental awareness, and navigation.  Person educated: Patient, husband Education method: collaborative learning, deliberate practice, positive reinforcement, explicit instruction, establish rules. Education comprehension: verbalized understanding  HOME EXERCISE PROGRAM: Repeat exposure to public spaces up to 1 hours outings to practice spatial navigation, awareness, and visual reintegration. Visual scanning of traffic from passenger seat of car for visual reintegration.   GOALS: Goals reviewed with patient? No  SHORT TERM GOALS: Target date: 02/25/24  Pt to report understanding of importance of progressive reintegration into community outings with progressive increase in length and complexity.  Baseline: Goal status: INITIAL  2.  Pt to reports ability to navigate simple, uncrowded public spaces at supervision level without use of SO for support and with use of railings prn.  Baseline:  Goal status: INITIAL  3.  Pt to report participation in regular long distance walking with SO 3x/week starting at 25  minutes per walk or greater.  Baseline:  Goal status: INITIAL ASSESSMENT:  CLINICAL IMPRESSION: 63yoF with 2 referrals to OPPT s/p CVA 2 months prior and for chronic knee pain. Pt has full ROM and pain free functional of LLE in examination today. Pt partakes in a variety of functional mobility and tests today, all of which are unable to quantify pt's subjective limitations in balance. Pt scores well on all measures. Author explained importance of community reintegration for current level appropriate rehabilitation of visual, balance, and cognitive processing systems. Pt would be well poised for work conditioning program to similar lengthy job demands, however she is functioning too high to be expected to gain much appreciable benefit from OPPT services in addressing her CC. Objective tests and measures are unable to reveal any concerning areas of impairment, hence these tools would not be able to show any objective improvement. Recommend 1-2 more visits to finish remaining assessment and promote return to community activity, then DC from OPPT. Pt is already seeing OT and ST for visual/spatial/cognitive deficits. Patient will benefit from skilled physical therapy intervention to reduce deficits and impairments identified in evaluation, in order to reduce pain, improve quality of life, and maximize activity  tolerance for ADL, IADL, and leisure/fitness. Physical therapy will help pt achieve long and short term goals of care.    OBJECTIVE IMPAIRMENTS: decreased activity tolerance, decreased cognition, decreased endurance, difficulty walking, decreased safety awareness, and impaired perceived functional ability.   ACTIVITY LIMITATIONS: bending and stairs  PARTICIPATION LIMITATIONS: medication management, interpersonal relationship, driving, shopping, community activity, and occupation  PERSONAL FACTORS: Profession and Time since onset of injury/illness/exacerbation are also affecting patient's functional  outcome.   REHAB POTENTIAL: Poor  CLINICAL DECISION MAKING: Evolving/moderate complexity  EVALUATION COMPLEXITY: High  PLAN:  PT FREQUENCY: 1-2x/week  PT DURATION: 4 weeks  PLANNED INTERVENTIONS: 97750- Physical Performance Testing, 97110-Therapeutic exercises, 97530- Therapeutic activity, 97112- Neuromuscular re-education, 97535- Self Care, 02859- Manual therapy, [phone]- Gait training, Patient/Family education, Balance training, Stair training, and Cognitive remediation  PLAN FOR NEXT SESSION:  Stairs assessment; miniBestTest; activity requiring head turns and ball catching, establish a plan for community trips 3-4x week x1 hours for mobility + visual navigation + scanning/ dual tasking.   10:07 PM, 02/20/24 Peggye JAYSON Linear, PT, DPT Physical Therapist - [facility]  Outpatient Physical Therapy- Main Campus [phone]      [place] C, PT 02/20/2024, 9:16 PM

## 2024-02-20 NOTE — Therapy (Signed)
 OUTPATIENT SPEECH LANGUAGE PATHOLOGY  EVALUATION   Patient Name: Ashley Collins MRN: [id number] DOB:1961/06/04, 63 y.o., female Today's Date: 02/20/2024  PCP: Gaetana Haddock, NP  REFERRING PROVIDER: same   End of Session - 02/20/24 1626     Visit Number 1    Number of Visits 24    Date for SLP Re-Evaluation 05/14/24    Activity Tolerance Patient tolerated treatment well           Patient Active Problem List   Diagnosis Date Noted   Acute CVA (cerebrovascular accident) (HCC) 12/19/2023   Hypokalemia 12/19/2023   AKI (acute kidney injury) (HCC) 12/19/2023   Leukocytosis 12/19/2023   Hyperlipidemia, unspecified 12/19/2023   Essential hypertension 12/19/2023   Vision changes 12/19/2023   Lymphedema 05/22/2023   Chronic venous insufficiency 05/22/2023   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 replacement therapy 04/10/2023   Insomnia due to medical condition 04/25/2022   GAD (generalized anxiety disorder) 02/02/2022   Other specified depressive episodes 02/02/2022   Long-term current use of benzodiazepine 02/02/2022   Migraine with aura and without status migrainosus, not intractable 11/03/2020   Arrhythmia 08/06/2020   Status migrainosus 02/25/2019   Osteoporosis 04/09/2013   Vitamin D deficiency 04/09/2013   DDD (degenerative disc disease), lumbosacral 02/08/2005    ONSET DATE: 12/19/23   REFERRING DIAG: CVA- memory deficits  THERAPY DIAG:  Cognitive communication deficit  Rationale for Evaluation and Treatment Rehabilitation  SUBJECTIVE:   SUBJECTIVE STATEMENT: Pt alert, pleasant, and cooperative. Pt accompanied by: self and significant other  PERTINENT HISTORY & DIAGNOSTIC FINDINGS: Pt is 63 y.o. female who presents today for a cognitive-communication evaluation in setting of stroke. MRI 12/18/23 1. 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 No associated hemorrhage or mass effect. PMHx as outlined above.  PAIN:  Are you having  pain? No   FALLS: Has patient fallen in last 6 months?  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   PATIENT GOALS  to return to PLOF   OBJECTIVE:   COGNITIVE COMMUNICATION: Overall cognitive status: Impaired Areas of impairment:  Attention: Impaired: Selective, Alternating, Divided Memory: Impaired: Working IT consultant function: Impaired: Problem solving, Organization, Self-correction, and Slow processing    AUDITORY COMPREHENSION: Overall auditory comprehension: Appears intact    READING COMPREHENSION: DNT  EXPRESSION: verbal  VERBAL EXPRESSION: Level of generative/spontaneous verbalization: word, phrase, sentence, and conversation   WRITTEN EXPRESSION: Dominant hand: right   Written expression: Appears intact  MOTOR SPEECH: WFL  ORAL MOTOR EXAMINATION: WFL  STANDARDIZED ASSESSMENTS:   Cognitive Linguistic Quick Test: AGE - 18 - 69   The Cognitive Linguistic Quick Test (CLQT) was administered to assess the relative status of five cognitive domains: attention, memory, language, executive functioning, and visuospatial skills. Scores from 10 tasks were used to estimate severity ratings (standardized for age groups 18-69 years and 70-89 years) for each domain, a clock drawing task, as well as an overall composite severity rating of cognition.       Task Score Criterion Cut Scores  Personal Facts 8/8 8  Symbol Cancellation 0/12 11  Confrontation Naming 10/10 10  Clock Drawing  12/13 12  Story Retelling 9/10 6  Symbol Trails 7/10 9  Generative Naming 8/9 5  Design Memory 4/6 5  Mazes  4/8 7  Design Generation  7/13 6    Cognitive Domain Composite Score Severity Rating  Attention 70/215 Moderate  Memory 158/185 WNL   Executive Function 28/40 WNL  Language 37/37 WNL  Visuospatial Skills 49/105 Moderate  Clock Drawing  12/13 WNL  Composite Severity Rating  Mild           PATIENT REPORTED OUTCOME MEASURES (PROM):  The Neuro-QOLT Item Bank v2.0-Cognition Function-Short Form is an eight-item test designed to measure difficulties with cognitive functioning (e.g., memory, attention and decision making or in the application of such abilities to everyday tasks (e.g., planning, organizing, calculating, remembering and learning). Source: Meg CHARM Haber, J-S, et al. (2012). Neuro-QOL: brief measures of health-related quality of life for clinical research in neurology. Neurology, 78(23), [phone].   In the past 7 days...  I had to read something several times to understand it. 2- Often (once a day)  My thinking was slow. 1- Very Often (several times a day)  I had to work really hard to pay attention or I would make a mistake. 1- Very Often (several times a day)  I had trouble concentrating. 1- Very Often (several times a day)   How much DIFFICULTY do you currently have...  Reading and following complex instructions (e.g., directions for a new medication)? 2- A lot  Planning for and keeping appointments that are not part of your weekly routine (e.g., a therapy or doctor appointment, or a social gathering with friends and family)? 3- Somewhat  Managing your time to do most of your daily activities? 2- A lot  Learning new tasks or instructions? 2- A lot   T-SCORE: 28.6; >2 SD below mean of 50   TODAY'S TREATMENT:  Pt and husband educated re: role of SLP, domains of cognition, rationale for cognitive-communication assessment, results of assessment, focus of SLP sessions, changes to cognitive-communication following stroke, and SLP POC.    PATIENT EDUCATION: Education details: as above Person educated: Patient and Spouse Education method: Explanation Education comprehension: verbalized  understanding  HOME EXERCISE PROGRAM:        To be given in upcoming sessions    GOALS:  Goals reviewed with patient? Yes  SHORT TERM GOALS: Target date: 10 sessions  Pt will complete PROM re: memory.  Baseline: Goal status: INITIAL   2.  Pt will endorse successful implementation of at least x2 compensations for attention and memory.  Baseline:  Goal status: INITIAL  3.  With Moderate A, patient will establish external aid for memory/executive function and bring to more than 50% of therapy sessions.    Baseline:  Goal status: INITIAL    LONG TERM GOALS: Target date: 12 weeks  Pt will endorse improvement in cognitive-communication per PROM.  Baseline:  Goal status: INITIAL  2.  Pt and/or husband will demonstrate understanding of ways to promote and support cognitive-communication outside of SLP sessions.  Baseline:  Goal status: INITIAL   ASSESSMENT:  CLINICAL IMPRESSION:  Pt is 63 y.o. female who presents today for a cognitive-communication evaluation in setting of stroke. Assessment completed via formal means (Cognitive-Linguistic Quick Test) and PROM (Neuro-QoL Adult Cognitive Function v2.0). Pt presents with at least mild cognitive-communication deficits affecting attention and visuospatial skills per CLQT as well as memory and executive functioning per PROM. Suspect CLQT is not as sensitive to higher level cognitive-communication deficits appreciated by pt during iADLs. Recommend skilled ST services targeting above mentioned deficits to improve QoL and performance on ADLs/IADLs.  OBJECTIVE IMPAIRMENTS include attention, memory, executive functioning, and visuospatial deficits. These impairments  are limiting patient from return to work, managing appointments, household responsibilities, and ADLs/IADLs. Factors affecting potential to achieve goals and functional outcome are medical prognosis.. Patient will benefit from skilled SLP services to address above impairments  and improve overall function.  REHAB POTENTIAL: Good  PLAN: SLP FREQUENCY: 2x/week  SLP DURATION: 12 weeks  PLANNED INTERVENTIONS: Cueing hierachy, Cognitive reorganization, Internal/external aids, Functional tasks, SLP instruction and feedback, Compensatory strategies, and Patient/family education    Delon Bangs, M.S., CCC-SLP Speech-Language Pathologist [facility] [phone] [place])  [facility] Rehabilitation at [facility] [address] [place], [place], [address] Phone: [phone]   Fax:  [phone]

## 2024-02-21 ENCOUNTER — Ambulatory Visit: Admitting: Occupational Therapy

## 2024-02-21 ENCOUNTER — Ambulatory Visit

## 2024-02-21 ENCOUNTER — Ambulatory Visit: Admitting: Physical Therapy

## 2024-02-21 DIAGNOSIS — M6281 Muscle weakness (generalized): Secondary | ICD-10-CM

## 2024-02-21 DIAGNOSIS — R262 Difficulty in walking, not elsewhere classified: Secondary | ICD-10-CM

## 2024-02-21 DIAGNOSIS — H543 Unqualified visual loss, both eyes: Secondary | ICD-10-CM

## 2024-02-21 DIAGNOSIS — R41841 Cognitive communication deficit: Secondary | ICD-10-CM

## 2024-02-21 DIAGNOSIS — R278 Other lack of coordination: Secondary | ICD-10-CM

## 2024-02-21 DIAGNOSIS — H547 Unspecified visual loss: Secondary | ICD-10-CM

## 2024-02-21 NOTE — Therapy (Signed)
 OCCUPATIONAL THERAPY NEURO TREATMENT NOTE  Patient Name: Ashley Collins MRN: [id number] DOB:1960/08/09, 63 y.o., female Today's Date: 02/21/2024  PCP: Harvey Gaetana CROME, NP REFERRING PROVIDER: Harvey Gaetana CROME, NP   OT End of Session - 02/21/24 1417     Visit Number 17    Number of Visits 24    Date for OT Re-Evaluation 03/20/24    OT Start Time 1400    OT Stop Time 1445    OT Time Calculation (min) 45 min    Activity Tolerance Patient tolerated treatment well    Behavior During Therapy WFL for tasks assessed/performed             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   Cardiac arrhythmia    Nonspecific ST T wave changes on EKG   Chronic venous insufficiency of lower extremity    Complication of anesthesia    nausea and vomiting   DDD (degenerative disc disease), lumbosacral    Essential hypertension    Headache    History of kidney stones    Hyperlipidemia    Kidney stones    Lymphedema    Migraines    Osteoporosis    PONV (postoperative nausea and vomiting)    Right ureteral stone    Vitamin B12 deficiency    Vitamin D deficiency    Past Surgical History:  Procedure Laterality Date   AUGMENTATION MAMMAPLASTY     CESAREAN SECTION     x 4   COLONOSCOPY WITH PROPOFOL  N/A 03/31/2022   Procedure: COLONOSCOPY WITH PROPOFOL ;  Surgeon: Onita Elspeth Sharper, DO;  Location: [facility];  Service: Gastroenterology;  Laterality: N/A;   CYSTOSCOPY W/ RETROGRADES Bilateral 07/10/2020   Procedure: CYSTOSCOPY WITH RETROGRADE PYELOGRAM;  Surgeon: Francisca Redell BROCKS, MD;  Location: [facility];  Service: Urology;  Laterality: Bilateral;   CYSTOSCOPY/URETEROSCOPY/HOLMIUM LASER/STENT PLACEMENT     CYSTOSCOPY/URETEROSCOPY/HOLMIUM LASER/STENT PLACEMENT Bilateral 07/10/2020   Procedure: CYSTOSCOPY/URETEROSCOPY/HOLMIUM LASER/STENT PLACEMENT;  Surgeon: Francisca Redell BROCKS, MD;  Location: [facility];  Service: Urology;   Laterality: Bilateral;   CYSTOSCOPY/URETEROSCOPY/HOLMIUM LASER/STENT PLACEMENT Right 08/25/2023   Procedure: CYSTOSCOPY/URETEROSCOPY/HOLMIUM LASER;  Surgeon: Francisca Redell BROCKS, MD;  Location: [facility];  Service: Urology;  Laterality: Right;   CYSTOSCOPY/URETEROSCOPY/HOLMIUM LASER/STENT PLACEMENT Right 12/15/2023   Procedure: CYSTOSCOPY/URETEROSCOPY/HOLMIUM LASER;  Surgeon: Francisca Redell BROCKS, MD;  Location: [facility];  Service: Urology;  Laterality: Right;   EXTRACORPOREAL SHOCK WAVE LITHOTRIPSY     x 10 plus   Eye Lift     FACIAL COSMETIC SURGERY     GANGLION CYST EXCISION Right 12/21/2021   Procedure: REMOVAL GANGLION OF WRIST;  Surgeon: Kathlynn Sharper, MD;  Location: [facility];  Service: Orthopedics;  Laterality: Right;   LIPOSUCTION     TONSILLECTOMY     TRIGGER FINGER RELEASE Right 12/21/2021   Procedure: RELEASE TRIGGER FINGER/A-1 PULLEY;  Surgeon: Kathlynn Sharper, MD;  Location: [facility];  Service: Orthopedics;  Laterality: Right;   URETEROSCOPY WITH HOLMIUM LASER LITHOTRIPSY     Patient Active Problem List   Diagnosis Date Noted   Acute CVA (cerebrovascular accident) (HCC) 12/19/2023   Hypokalemia 12/19/2023   AKI (acute kidney injury) (HCC) 12/19/2023   Leukocytosis 12/19/2023   Hyperlipidemia, unspecified 12/19/2023   Essential hypertension 12/19/2023   Vision changes 12/19/2023   Lymphedema 05/22/2023   Chronic venous insufficiency 05/22/2023   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 replacement therapy 04/10/2023   Insomnia due  to medical condition 04/25/2022   GAD (generalized anxiety disorder) 02/02/2022   Other specified depressive episodes 02/02/2022   Long-term current use of benzodiazepine 02/02/2022   Migraine with aura and without status migrainosus, not intractable 11/03/2020   Arrhythmia 08/06/2020   Status migrainosus 02/25/2019   Osteoporosis 04/09/2013   Vitamin D deficiency 04/09/2013   DDD (degenerative disc disease), lumbosacral 02/08/2005   ONSET DATE:  12/18/2023  REFERRING DIAG:   THERAPY DIAG:  Muscle weakness (generalized)  Other lack of coordination  Rationale for Evaluation and Treatment: Rehabilitation  SUBJECTIVE:  SUBJECTIVE STATEMENT: Pt. has a speech therapy evaluation this afternoon. Pt accompanied by: significant other  PERTINENT HISTORY: Pt. has Hx of stroke with onset 12/18/2023. Pt. PMHx includes: HTN, Hyperlipidemia, Kidney Stones, near syncopal event, loss of vision.  PRECAUTIONS: None  WEIGHT BEARING RESTRICTIONS:  PAIN:  Are you having pain? No  FALLS: Has patient fallen in last 6 months? Yes. Number of falls    LIVING ENVIRONMENT: Lives with: lives with their family Lives in: [place], [place] Stairs: No, inside the house, yes but does not use Has following equipment at home:   PLOF: Independent  PATIENT GOALS: To be able to see  OBJECTIVE:  Note: Objective measures were completed at Evaluation unless otherwise noted.  HAND DOMINANCE: Right  ADLs:  Eating: Drinking from straw is different, able to use utensils.  Grooming: Fatigues completing hair care.  UB Dressing: independent, if clothes are in front of her she can find clothing LB Dressing: Independent Toileting: Independent Bathing: Independent Tub Shower transfers: Independent Equipment: none Has difficulty with using cell phone  IADLs: Shopping: Does not typically go out shopping, and has not tried to. Light housekeeping: Pt. reports that she does not typically do house cleaning. Pt. has been able to unpack belongings from boxes due to her recent move in. Meal Prep: Pt. reports that is does not currently cook, however reports that she probably could. Has difficulty opening bottle caps/lids. Community mobility: Requires assistance from husband to navigate  through buildings, negotiate stairs-Pt. With increased fear of falling  Medication management: Able to push down medication bottles to open  Financial management: No change in the  process-uses automatic bill pay system.  Has difficulty with using cell phone Handwriting: N/T Work: Pt. was actively working managing a Health visitor, works a lot of hours  MOBILITY STATUS: Independent and Needs Assist: Requires hand on hand assistance with nvigating through environments.   POSTURE COMMENTS:  No Significant postural limitations Sitting balance: Good  ACTIVITY TOLERANCE: Activity tolerance: Good  FUNCTIONAL OUTCOME MEASURES:   UPPER EXTREMITY ROM:    Active ROM Right Eval WFL Left Eval [facility]  Shoulder flexion    Shoulder abduction    Shoulder adduction    Shoulder extension    Shoulder internal rotation    Shoulder external rotation    Elbow flexion    Elbow extension    Wrist flexion    Wrist extension    Wrist ulnar deviation    Wrist radial deviation    Wrist pronation    Wrist supination    (Blank rows = not tested)  UPPER EXTREMITY MMT:     MMT Right eval  Left eval   Shoulder flexion 4-/5  4-/5   Shoulder abduction 4-/5  4-/5   Shoulder adduction      Shoulder extension      Shoulder internal rotation      Shoulder external rotation      Middle trapezius  Lower trapezius      Elbow flexion 5/5  5/5   Elbow extension 5/5  5/5   Wrist flexion 4/5  4-/5   Wrist extension 4/5  4-/5   Wrist ulnar deviation      Wrist radial deviation      Wrist pronation      Wrist supination      (Blank rows = not tested)  HAND FUNCTION: Grip strength: Right: 39 lbs; Left: 28 lbs, Lateral pinch: Right: 11 lbs, Left: 9 lbs, and 3 point pinch: Right: 9 lbs, Left: 9 lbs  01/29/24 Grip strength: Right: 39 lbs; Left: 36 lbs, Lateral pinch: Right: 14 lbs, Left: 12 lbs, and 3 point pinch: Right: 12 lbs, Left: 11 lbs   COORDINATION: 9 Hole Peg test: Right: 30 sec; Left: 36 sec  01/29/24 9 Hole Peg test: Right: 24 sec; Left: 23 sec   SENSATION: WFL  EDEMA:   MUSCLE TONE:   COGNITION: Overall cognitive status: Within functional limits  for tasks assessed  VISION: Subjective report: Pt. Reports changes in her vision at onset of stroke, starting with blurry vision, resulting in vision loss in the R eye.  Baseline vision: Pt. Is able to visually track  Visual history: Hx of eye surgery  VISION ASSESSMENT: TBD  PERCEPTION: WFL  PRAXIS: WFL  OBSERVATIONS:                                                                                                                    TREATMENT DATE: 02/21/2024   Therapeutic Activities:   -Facilitated visual perceptual visual motor skills copying a progression from moderately to more complex images with emphasis on refining the details of each image. Pt. Worked on tracing each image prior to copying them requiring consistent cueing for accuracy.  -Pt. Worked on identifying detail omissions for each image requiring cues. -Pt. Worked on using a ruler to complete design dimensions with emphasis place on the accuracy of measurements with the design detail. -Pt. Worked on writing her full name, and date on each of the pages on a small designated line to promote smaller writing letter size.  Education details: Condition management/visual compensation strategies Person educated: Patient and Spouse Education method: Explanation and Verbal cues Education comprehension: verbalized understanding and returned demonstration  HOME EXERCISE PROGRAM:  -visual scanning tasks at the tabletop.    GOALS: Goals reviewed with patient? Yes  SHORT TERM GOALS: Target date: 02/06/2024    Pt. Will independently utilize HEP for hand strength, coordination, and visual compensatory strategies ADL/ADLs Baseline: Eval: No  current HEP  Goal status: INITIAL   LONG TERM GOALS: Target date: 03/19/2024    Pt. Will be able to independently implement visual scanning/visual search compensatory strategies for tasks within her extra personal space navigating through community environments 100% of the time.   Baseline: 01/29/24:  Independent  100% of the time within the therapy gym, and hallway. Eval: Pt. Requires increased assist to navigate through community environments. Goal status: Achieved  2.  Pt. Will be able to independently utilize visual scanning/visual search strategies  during ADLS/IADL within her near space, and during tabletop top tasks. 100% of the time.  Baseline: 01/29/24: 75% Eval: Pt. Education to be provided.  Goal status: INITIAL  3.  Pt. Will be able to independently initiate visual scanning techniques in her own environment to reduce risk of falls.  Baseline: 01/29/24: Education was provided, Pt. Is utlizing visual compensatory strategies/visual scanning with in her home. Eval: Pt. Education to be provided.  Goal status: Achieved  4.  Pt. Will increase BUE Grip Strength by 5# to be able to independently hold objects for ADL/IADL use. Baseline: 01/29/24: Grip strength: Right: 39 lbs; Left: 28 lbs, Eval: Right Grip: 39#, Left Grip: 28# Goal status: Achieved  5.  Pt. Will increase L Lateral Pinch strength by 2# to be able to independently twist off bottle caps/lids. Baseline: 01/29/24: Lateral pinch: Right: 14 lbs, Left: 12 lbs Eval: Lateral pinch: Right: 11 lbs, Left: 9 lbs  Goal status: Achieved  6.  Pt. Will increase BUE strength for shoulder flexion/abduction by 2 mm grades to independently complete hair care tasks. Baseline: 01/29/24: 5/5 overall Eval: Right Shoulder Flexion: 4-/5, Left Shoulder Flexion:4-/5, Right Shoulder Abduction: 4-/5, Left Shoulder Abduction: 4-/5 Goal status: Achieved  7.  Pt will be indep with medication set up using weekly pill organizer.  Baseline: 01/29/24: Independent 01/15/24: Daughter currently manages medication set up.  Goal status: Achieved  8. Pt will increase typing speed to 20-30 words per minute with at least 90% accuracy to work towards more efficient typing for job related  responsibilities.  Baseline: 01/29/24: Continue 01/15/24: 8wpm  x88% accuracy=7 wpm  Goal status: Achieved  9. Pt will be able to scan small print on store receipts to check for errors with 100% accuracy using visual compensation strategies as needed.  Baseline: 01/29/24: Pt. Continues to have difficulty scanning small print items. 01/15/24: Not yet attempted; required for return to work/job responsibility  Goal status:  Ongoing  10. Pt. Will independently identify 8 items consistently form visual visual memory in preparation or ADLs.    Baseline: 01/29/24: Pt. Is able to identify 6 items consistently from visual memory.    Gaol status: New ASSESSMENT: CLINICAL IMPRESSION:   Reviewed HEP for visual scanning, visual saccades with gradual progression to widen the scan plane. Pt. presented with difficulty copying designs after first tracing them.  Pt. presented with difficulty formulating designs that require lines to come to the same point, often malaligning. Pt. was able to accurately measure each design using a standard ruler. Pt. presented with difficulty angling the ruler appropriately in order to copy the the design accurately to place multiple progressively smaller shapes within each other. Pt. continues to benefit from Occupational Therapy services to improve her ability to use visual compensatory strategies, and improve overall BUE functioning in order to improve engagement in, and maximize overall independence with ADL, and IADL tasks.  PERFORMANCE DEFICITS: in functional skills including ADLs, IADLs, coordination, dexterity, Fine motor control, and vision, and psychosocial skills including coping strategies, environmental adaptation, habits, interpersonal interactions, and routines and behaviors.   IMPAIRMENTS: are limiting patient from ADLs, IADLs, rest and sleep, work, leisure, and social participation.   CO-MORBIDITIES: may have co-morbidities  that affects occupational performance. Patient will benefit from skilled OT to address above impairments and  improve overall function.  MODIFICATION OR ASSISTANCE TO COMPLETE EVALUATION: Min-Moderate modification of tasks or assist with assess necessary to complete an evaluation.  OT OCCUPATIONAL PROFILE AND HISTORY: Detailed assessment: Review of records and additional review of physical, cognitive, psychosocial history related to current functional performance.  CLINICAL DECISION MAKING: Moderate - several treatment options, min-mod task modification necessary  REHAB POTENTIAL: Good  EVALUATION COMPLEXITY: Moderate    PLAN:  OT FREQUENCY: 2x/week  OT DURATION: 12 weeks  PLANNED INTERVENTIONS: 97168 OT Re-evaluation, 97535 self care/ADL training, 02889 therapeutic exercise, 97530 therapeutic activity, 97112 neuromuscular re-education, visual/perceptual remediation/compensation, patient/family education, and DME and/or AE instructions  RECOMMENDED OTHER SERVICES: ST  CONSULTED AND AGREED WITH PLAN OF CARE: Patient and family member/caregiver  PLAN FOR NEXT SESSION: Treatment  Richardson Otter, MS, OTR/L   02/20/24, 3:01 PM

## 2024-02-21 NOTE — Therapy (Signed)
 OUTPATIENT SPEECH LANGUAGE PATHOLOGY  TREATMENT   Patient Name: Ashley Collins MRN: [id number] DOB:February 15, 1961, 63 y.o., female Today's Date: 02/21/2024  PCP: Gaetana Haddock, NP  REFERRING PROVIDER: same   End of Session - 02/21/24 1308     Visit Number 2    Number of Visits 24    Date for SLP Re-Evaluation 05/14/24    SLP Start Time 1315    SLP Stop Time  1400    SLP Time Calculation (min) 45 min    Activity Tolerance Patient tolerated treatment well           Patient Active Problem List   Diagnosis Date Noted   Acute CVA (cerebrovascular accident) (HCC) 12/19/2023   Hypokalemia 12/19/2023   AKI (acute kidney injury) (HCC) 12/19/2023   Leukocytosis 12/19/2023   Hyperlipidemia, unspecified 12/19/2023   Essential hypertension 12/19/2023   Vision changes 12/19/2023   Lymphedema 05/22/2023   Chronic venous insufficiency 05/22/2023   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 replacement therapy 04/10/2023   Insomnia due to medical condition 04/25/2022   GAD (generalized anxiety disorder) 02/02/2022   Other specified depressive episodes 02/02/2022   Long-term current use of benzodiazepine 02/02/2022   Migraine with aura and without status migrainosus, not intractable 11/03/2020   Arrhythmia 08/06/2020   Status migrainosus 02/25/2019   Osteoporosis 04/09/2013   Vitamin D deficiency 04/09/2013   DDD (degenerative disc disease), lumbosacral 02/08/2005    ONSET DATE: 12/19/23   REFERRING DIAG: CVA- memory deficits  THERAPY DIAG:  Cognitive communication deficit  Rationale for Evaluation and Treatment Rehabilitation  SUBJECTIVE:   SUBJECTIVE STATEMENT: Pt alert, pleasant, and cooperative. Pt accompanied by: self and significant other  PERTINENT HISTORY & DIAGNOSTIC FINDINGS: Pt is 63 y.o. female who presents today for a cognitive-communication evaluation in setting of stroke. MRI 12/18/23 1. 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 No  associated hemorrhage or mass effect. PMHx as outlined above.  PAIN:  Are you having pain? No   FALLS: Has patient fallen in last 6 months?  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   PATIENT GOALS  to return to [employer]   OBJECTIVE:   TODAY'S TREATMENT:  MULTIFACTORIAL MEMORY QUESTIONNAIRE (MMQ)  Administered patient self-reported outcome measure Multifactorial Memory Questionnaire (MMQ). The Multifactorial Memory Questionnaire [facility]) consists of three scales measuring separate aspects of metamemory; Satisfaction, Ability and Strategy.   Pt's responses are converted to T-Scores with severity levels based on pt's T-Score.   Severity Levels (T-score) Very Low - < 20 Low - 20 to 29 Below Average - 30-39 Average - 40 to 60 Above Average - 60 to 70 High - 71 to 80 Very High - > 80  Pt reports:  Very Low - < 20 Memory Satisfaction (T-score: 26 ) Below Average - 30-39 Memory Ability (T-score: 33) Below Average - 30-39 use of Memory Strategies (T-score: 44)  Utilized MMQ to facilitate conversation and education re: CLOF, compensations, and feelings re: memory. Noted pt endorsed frustration with forgetting details (e.g. daily events, conversation, TV shows) and losing track of items/thoughts (e.g. why she entered a room, mistakes while following a recipe, misplacing jewelry). Introduced Fish farm manager of use of journaling to improve short term and prospective memory. Downloaded journal app on patients phone. Demo'd mock journal entry. Pt given written instructions to  initiate journaling at home. Introduced attention strategies such as telling self ot mentally focus and to narrate actions aloud (e.g. when cooking, placing items down, or while going to get something from  another room). SLP to f/u re: use of strategies in upcoming sessions.   PATIENT EDUCATION: Education details: as above Person educated: Patient and Spouse Education method: Explanation; Handout Education comprehension: verbalized understanding  HOME EXERCISE PROGRAM:        Practice narrating actions  Telling self to mentally focus  Initiate journaling for short term and prospective memory    GOALS:  Goals reviewed with patient? Yes  SHORT TERM GOALS: Target date: 10 sessions  Pt will complete PROM re: memory.  Baseline: Goal status: INITIAL   2.  Pt will endorse successful implementation of at least x2 compensations for attention and memory.  Baseline:  Goal status: INITIAL  3.  With Moderate A, patient will establish external aid for memory/executive function and bring to more than 50% of therapy sessions.    Baseline:  Goal status: INITIAL    LONG TERM GOALS: Target date: 12 weeks  Pt will endorse improvement in cognitive-communication per PROM.  Baseline:  Goal status: INITIAL  2.  Pt and/or husband will demonstrate understanding of ways to promote and support cognitive-communication outside of SLP sessions.  Baseline:  Goal status: INITIAL   ASSESSMENT:  CLINICAL IMPRESSION:  Pt is 63 y.o. female who presents today for a cognitive-communication treatment in setting of stroke. Initial assessment completed via formal means (Cognitive-Linguistic Quick Test) and PROM (Neuro-QoL Adult Cognitive Function v2.0). Pt presents with at least mild cognitive-communication deficits affecting attention and visuospatial skills per CLQT as well as memory and executive functioning per PROM. Suspect CLQT is not as sensitive to higher level cognitive-communication deficits appreciated by pt during iADLs. See details of today's tx as outlined above. Recommend skilled ST services targeting above mentioned deficits to improve QoL and performance on ADLs/IADLs.  OBJECTIVE IMPAIRMENTS  include attention, memory, executive functioning, and visuospatial deficits. These impairments are limiting patient from return to work, managing appointments, household responsibilities, and ADLs/IADLs. Factors affecting potential to achieve goals and functional outcome are medical prognosis.. Patient will benefit from skilled SLP services to address above impairments and improve overall function.  REHAB POTENTIAL: Good  PLAN: SLP FREQUENCY: 2x/week  SLP DURATION: 12 weeks  PLANNED INTERVENTIONS: Cueing hierachy, Cognitive reorganization, Internal/external aids, Functional tasks, SLP instruction and feedback, Compensatory strategies, and Patient/family education    Delon Bangs, M.S., CCC-SLP Speech-Language Pathologist [place] [facility] [phone] [place])  [facility] at [facility] [address] [place], [place], [address] Phone: [phone]   Fax:  [phone]

## 2024-02-21 NOTE — Therapy (Unsigned)
 OUTPATIENT PHYSICAL THERAPY EVALUATION  Patient Name: Ashley Collins MRN: [id number] DOB:Feb 21, 1961, 63 y.o., female Today's Date: 02/21/2024  PCP: Harvey Gaetana CROME, NP REFERRING PROVIDER: Harvey Gaetana CROME, NP, Prentice Reges, DO   END OF SESSION:  PT End of Session - 02/21/24 1446     Visit Number 2    Number of Visits 6    Date for PT Re-Evaluation 03/19/24    Authorization Type [employer] HMO/PPO    Authorization Time Period 02/20/24-03/19/24    Progress Note Due on Visit 10    PT Start Time 1446    PT Stop Time 1530    PT Time Calculation (min) 44 min    Activity Tolerance Patient tolerated treatment well;No increased pain    Behavior During Therapy WFL for tasks assessed/performed          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   Cardiac arrhythmia    Nonspecific ST T wave changes on EKG   Chronic venous insufficiency of lower extremity    Complication of anesthesia    nausea and vomiting   DDD (degenerative disc disease), lumbosacral    Essential hypertension    Headache    History of kidney stones    Hyperlipidemia    Kidney stones    Lymphedema    Migraines    Osteoporosis    PONV (postoperative nausea and vomiting)    Right ureteral stone    Vitamin B12 deficiency    Vitamin D deficiency    Past Surgical History:  Procedure Laterality Date   AUGMENTATION MAMMAPLASTY     CESAREAN SECTION     x 4   COLONOSCOPY WITH PROPOFOL  N/A 03/31/2022   Procedure: COLONOSCOPY WITH PROPOFOL ;  Surgeon: Onita Elspeth Sharper, DO;  Location: [facility] ENDOSCOPY;  Service: Gastroenterology;  Laterality: N/A;   CYSTOSCOPY W/ RETROGRADES Bilateral 07/10/2020   Procedure: CYSTOSCOPY WITH RETROGRADE PYELOGRAM;  Surgeon: Francisca Redell BROCKS, MD;  Location: [facility] ORS;  Service: Urology;  Laterality: Bilateral;   CYSTOSCOPY/URETEROSCOPY/HOLMIUM LASER/STENT PLACEMENT     CYSTOSCOPY/URETEROSCOPY/HOLMIUM LASER/STENT  PLACEMENT Bilateral 07/10/2020   Procedure: CYSTOSCOPY/URETEROSCOPY/HOLMIUM LASER/STENT PLACEMENT;  Surgeon: Francisca Redell BROCKS, MD;  Location: [facility] ORS;  Service: Urology;  Laterality: Bilateral;   CYSTOSCOPY/URETEROSCOPY/HOLMIUM LASER/STENT PLACEMENT Right 08/25/2023   Procedure: CYSTOSCOPY/URETEROSCOPY/HOLMIUM LASER;  Surgeon: Francisca Redell BROCKS, MD;  Location: [facility] ORS;  Service: Urology;  Laterality: Right;   CYSTOSCOPY/URETEROSCOPY/HOLMIUM LASER/STENT PLACEMENT Right 12/15/2023   Procedure: CYSTOSCOPY/URETEROSCOPY/HOLMIUM LASER;  Surgeon: Francisca Redell BROCKS, MD;  Location: [facility] ORS;  Service: Urology;  Laterality: Right;   EXTRACORPOREAL SHOCK WAVE LITHOTRIPSY     x 10 plus   Eye Lift     FACIAL COSMETIC SURGERY     GANGLION CYST EXCISION Right 12/21/2021   Procedure: REMOVAL GANGLION OF WRIST;  Surgeon: Kathlynn Sharper, MD;  Location: [facility] ORS;  Service: Orthopedics;  Laterality: Right;   LIPOSUCTION     TONSILLECTOMY     TRIGGER FINGER RELEASE Right 12/21/2021   Procedure: RELEASE TRIGGER FINGER/A-1 PULLEY;  Surgeon: Kathlynn Sharper, MD;  Location: [facility] ORS;  Service: Orthopedics;  Laterality: Right;   URETEROSCOPY WITH HOLMIUM LASER LITHOTRIPSY     Patient Active Problem List   Diagnosis Date Noted   Acute CVA (cerebrovascular accident) (HCC) 12/19/2023   Hypokalemia 12/19/2023   AKI (acute kidney injury) (HCC) 12/19/2023   Leukocytosis 12/19/2023   Hyperlipidemia, unspecified 12/19/2023   Essential hypertension  12/19/2023   Vision changes 12/19/2023   Lymphedema 05/22/2023   Chronic venous insufficiency 05/22/2023   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 replacement therapy 04/10/2023   Insomnia due to medical condition 04/25/2022   GAD (generalized anxiety disorder) 02/02/2022   Other specified depressive episodes 02/02/2022   Long-term current use of benzodiazepine 02/02/2022   Migraine with aura and without status migrainosus, not intractable 11/03/2020   Arrhythmia 08/06/2020   Status  migrainosus 02/25/2019   Osteoporosis 04/09/2013   Vitamin D deficiency 04/09/2013   DDD (degenerative disc disease), lumbosacral 02/08/2005   ONSET DATE: July 2025 REFERRING DIAG: s/p CVA; chronic Left knee pain  THERAPY DIAG:  Muscle weakness (generalized)  Other lack of coordination  Cognitive communication deficit  Difficulty in walking, not elsewhere classified  Low vision, both eyes  Visual impairment  Rationale for Evaluation and Treatment: Rehabilitation  SUBJECTIVE:                                                                                                                                                                                             SUBJECTIVE STATEMENT: Pt reports she continues to have limited confidence in AMB outside of household distances since CVA in July. Wants to improve safety and endurance to allow return to work as soon as possible.   Is curious if the use of treadmill at community fitness center would be safe and beneficial   Pt accompanied by: significant other  PERTINENT HISTORY:   63yoF who sustained a CVA in July 2025 with visual field loss and memory impairment, now followed by [facility]y and [facility]. Pt has been working with OT here since July, OT noted some episodic imbalance and mild cognitive impairment hence recommended referral to OPPT and OPST. PCP sent referrals for these 3rd week of August. Pt also seen by [facility] sports medicine for chronic Left knee pain s/p remote menisectomy, MRI results pending, also sent a referral to PT for Left knee pain. Pt has memory difficulty since CVA which makes it difficult for her to specific particular deficits or difficulty with mobility since CVA, however OT has seen some episodic stumbling and LOB after bending forward to reach. Pt reports limited confidence in balance overall when walking out of house, will hold onto husband, is startled by new visual input in crowded spaces pertaining  to visual deficits. 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   PAIN:  Are you having pain? No  PRECAUTIONS: None   WEIGHT BEARING RESTRICTIONS: No  FALLS: Has patient fallen in last 6 months? 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   PATIENT GOALS: Return to work, full confident mobility/activity.   OBJECTIVE:  Note: Objective measures were completed at Evaluation unless otherwise noted.  DIAGNOSTIC FINDINGS: Left knee MRI results pending.   COGNITION: Overall cognitive status: mild cognitive impairment s/p CVA in July, being seen for visual.cognitive remediation with OT and ST.    SENSATION: No reported deficits, not assessed.   EDEMA:  Mild focal edema to the anterior knee extensor mechanism, non tender, reported as chronic.   KNEE ROM:   0->130 degrees left knee flexion, no pain on inspection.   FUNCTIONAL MOBILITY ASSESSMENT:  1. Overground AMB, shod, level surface: no frank deviation or asymmetry, no LOB, gait speed appropriate to situation, age.   2. : 0.76m/s  3. retro: 0.75m/s (supervision level, no LOB)   4. 30sec chair rise: hands free, standardized height, 11x; no c/o of knee pain, mild asymmetry, mild falls anxiety without LOB  5. SLS balance: RLE: >30sec; LLE: >25sec (minGuardA)   6. Eyes closed balance on foam surface: 60sec, moderately increased sway without frank LOB   7. Curb step up/down (6): 3x each without LOB with modI 180 degree turns   8. Visual scanning and navigation about a congested area in [employer] at lunch time: adequate path navigation and obstacle avoidance.   9. Walking eyes closed x42ft: no LOB, straight line of progression, <25% change in  gait speed   [facility] PT Assessment - 02/20/24 0001       Functional Gait  Assessment   Gait assessed  Yes    Gait Level Surface Walks 20 ft in less than 5.5 sec, no assistive devices, good speed, no evidence for imbalance, normal gait pattern, deviates no more than 6 in outside of the 12 in walkway width.    Change in Gait Speed Able to smoothly change walking speed without loss of balance or gait deviation. Deviate no more than 6 in outside of the 12 in walkway width.    Gait with Horizontal Head Turns Performs head turns smoothly with no change in gait. Deviates no more than 6 in outside 12 in walkway width    Gait with Vertical Head Turns Performs head turns with no change in gait. Deviates no more than 6 in outside 12 in walkway width.    Gait and Pivot Turn Pivot turns safely within 3 sec and stops quickly with no loss of balance.    Step Over Obstacle Is able to step over 2 stacked shoe boxes taped together (9 in total height) without changing gait speed. No evidence of imbalance.    Gait with Narrow Base of Support Is able to ambulate for 10 steps heel to toe with no staggering.    Gait with Eyes Closed Walks 20 ft, no assistive devices, good speed, no evidence of imbalance, normal gait pattern, deviates no more than 6 in outside 12 in walkway width. Ambulates 20 ft in less than 7 sec.    Ambulating Backwards Walks 20 ft, no assistive devices, good speed, no evidence for imbalance, normal gait    Steps Alternating feet, no rail.    Total Score 30       [facility] PT Assessment - 02/21/24 0001  Mini-BESTest   Sit To Stand Normal: Comes to stand without use of hands and stabilizes independently.    Rise to Toes Normal: Stable for 3 s with maximum height.    Stand on one leg (left) Normal: 20 s.    Stand on one leg (right) Moderate: < 20 s    Stand on one leg - lowest score 1    Compensatory Stepping Correction - Forward Normal: Recovers independently with a single, large step (second  realignement is allowed).    Compensatory Stepping Correction - Backward No step, OR would fall if not caught, OR falls spontaneously.    Compensatory Stepping Correction - Left Lateral Moderate: Several steps to recover equilibrium    Compensatory Stepping Correction - Right Lateral Moderate: Several steps to recover equilibrium    Stepping Corredtion Lateral - lowest score 1    Stance - Feet together, eyes open, firm surface  Normal: 30s    Stance - Feet together, eyes closed, foam surface  Moderate: < 30s    Incline - Eyes Closed Moderate: Stands independently < 30s OR aligns with surface    Change in Gait Speed Moderate: Unable to change walking speed or signs of imbalance    Walk with head turns - Horizontal Normal: performs head turns with no change in gait speed and good balance    Walk with pivot turns Normal: Turns with feet close FAST (< 3 steps) with good balance.    Step over obstacles Moderate: Steps over box but touches box OR displays cautious behavior by slowing gait.    Timed UP & GO with Dual Task Severe: Stops counting while walking OR stops walking while counting.   9.91 sec 17.25sec   Mini-BEST total score 18        MCID (Godi,et al, 2013)  Clinically meaningful change is improvement of 4 points (out of 28 total)                                                                                                                             TREATMENT DATE 02/21/24:    PT instructed pt in Minibest. 18/30 greatest difficulty with dual task,  Posteroir LOB correction, stepping over obstacles, SLS, and standing on wedge with eyes closed.   PT instructed pt in gait training on treadmill to improve activity tolerance, obstacle management and visual scanning, and to assess safety of treadmill training for home walking program -257 ft 1.2 mph x 3 min  -572 ft total from rounds 1-2; 1.2 mph,3 min ,  stepping intermittently over short golf club on belt  -868 ft total from rounds 1-3;  1.2 mph 3 min . , stepping over hedge hogs intermittently for BLE for    PATIENT EDUCATION: Education details: findings for balance deficits through minibest and how it relates to functional balance at work.  encouraged to initiate walking program on treadmill.  Person educated: Patient, husband Education method: collaborative learning, Diplomatic Services operational officer, positive  reinforcement, explicit instruction, establish rules. Education comprehension: verbalized understanding  HOME EXERCISE PROGRAM: Repeat exposure to public spaces up to 1 hours outings to practice spatial navigation, awareness, and visual reintegration. Visual scanning of traffic from passenger seat of car for visual reintegration.   GOALS: Goals reviewed with patient? No  SHORT TERM GOALS: Target date: 02/25/24  Pt to report understanding of importance of progressive reintegration into community outings with progressive increase in length and complexity.  Baseline: Goal status: INITIAL  2.  Pt to reports ability to navigate simple, uncrowded public spaces at supervision level without use of SO for support and with use of railings prn.  Baseline:  Goal status: INITIAL  3.  Pt to report participation in regular long distance walking with SO 3x/week starting at 25 minutes per walk or greater.  Baseline:  Goal status: INITIAL  4. Pt will increase Minibest by > 4 points to indicate improved safety with community mobility and reduced fall risk.  Baseline: 18 Goal status: initial    ASSESSMENT:  CLINICAL IMPRESSION: 63yoF with 2 referrals to OPPT s/p CVA 2 months prior and for chronic knee pain. PT completed Minibest; with mild fall risk indicated; greatest deficits with posterior LOB correction, standing on incline with eyes closed and dual task. States that she continues to feel unsafe with community mobility due to visual deficits. Tolerated obstacle navigation on treadmill well to prevent stepping on various height  obstacles for BLE and single LE. Instructed pt to perform treadmill walking program at home. Patient will benefit from skilled physical therapy intervention to reduce deficits and impairments identified in evaluation, in order to reduce pain, improve quality of life, and maximize activity tolerance for ADL, IADL, and leisure/fitness. Physical therapy will help pt achieve long and short term goals of care.    OBJECTIVE IMPAIRMENTS: decreased activity tolerance, decreased cognition, decreased endurance, difficulty walking, decreased safety awareness, and impaired perceived functional ability.   ACTIVITY LIMITATIONS: bending and stairs  PARTICIPATION LIMITATIONS: medication management, interpersonal relationship, driving, shopping, community activity, and occupation  PERSONAL FACTORS: Profession and Time since onset of injury/illness/exacerbation are also affecting patient's functional outcome.   REHAB POTENTIAL: Poor  CLINICAL DECISION MAKING: Evolving/moderate complexity  EVALUATION COMPLEXITY: High  PLAN:  PT FREQUENCY: 1-2x/week  PT DURATION: 4 weeks  PLANNED INTERVENTIONS: 97750- Physical Performance Testing, 97110-Therapeutic exercises, 97530- Therapeutic activity, 97112- Neuromuscular re-education, 97535- Self Care, [id number]- Manual therapy, [phone]- Gait training, Patient/Family education, Balance training, Stair training, and Cognitive remediation  PLAN FOR NEXT SESSION:   visual navigation + scanning/ dual tasking.  High level balance training.   2:48 PM, 02/21/24 [facility]  Outpatient Physical Therapy- Main [address]   Massie FORBES Dollar, [place] 02/21/2024, 2:48 PM

## 2024-02-22 ENCOUNTER — Telehealth: Admitting: Psychiatry

## 2024-02-22 ENCOUNTER — Encounter: Payer: Self-pay | Admitting: Psychiatry

## 2024-02-22 DIAGNOSIS — F411 Generalized anxiety disorder: Secondary | ICD-10-CM | POA: Diagnosis not present

## 2024-02-22 DIAGNOSIS — Z79899 Other long term (current) drug therapy: Secondary | ICD-10-CM | POA: Diagnosis not present

## 2024-02-22 DIAGNOSIS — G4701 Insomnia due to medical condition: Secondary | ICD-10-CM | POA: Diagnosis not present

## 2024-02-22 DIAGNOSIS — F3289 Other specified depressive episodes: Secondary | ICD-10-CM

## 2024-02-22 NOTE — Progress Notes (Unsigned)
 Virtual Visit via Video Note  I connected with Ashley Collins on 02/22/24 at  1:00 PM EDT by a video enabled telemedicine application and verified that I am speaking with the correct person using two identifiers.  Location Provider Location : [facility] Patient Location : Home  Participants: Patient , Provider   I discussed the limitations of evaluation and management by telemedicine and the availability of in person appointments. The patient expressed understanding and agreed to proceed.   I discussed the assessment and treatment plan with the patient. The patient was provided an opportunity to ask questions and all were answered. The patient agreed with the plan and demonstrated an understanding of the instructions.   The patient was advised to call back or seek an in-person evaluation if the symptoms worsen or if the condition fails to improve as anticipated.   BH MD OP Progress Note  02/23/2024 8:04 AM Ashley Collins  MRN:  [id number]  Chief Complaint:  Chief Complaint  Patient presents with   Follow-up   Anxiety   Medication Refill   Discussed the use of AI scribe software for clinical note transcription with the patient, who gave verbal consent to proceed.  History of Present Illness Ashley Collins is a 63 year old Caucasian female who is currently on FMLA, lives in [place], has a history of GAD, other specified depression, insomnia, long-term current use of benzodiazepines, migraine headaches, kidney stone was evaluated by telemedicine today.  Increased anxiety, particularly in large crowds and during daily activities, affects her since her recent stroke and throughout her ongoing recovery. She experiences heightened anxiety when attempting new activities or going out at night. She prefers a 'lightweight'medication to help manage her anxiety and currently takes lorazepam  as needed, but not daily. She also takes amitriptyline  75 mg at bedtime prescribed for headaches however also helps  with  anxiety and consistently takes it nightly. She feels concerned about brain fog as a side effect of her medications, so she has adjusted the timing of her medications to minimize this effect by taking most of them at night. She reports that her therapists and internal medicine provider discussed the possibility of additional medication for anxiety, but she is cautious about adding anything that could worsen her cognitive symptoms.  Short-term memory difficulties continue, and she addresses these in therapy. Occupational, physical, and speech therapists work with her to improve independence and cognitive functioning.  3 word memory immediate 3 out of 3, after 5 minutes 2 out of 3.  Was able to do calculations well.  Appeared to be alert, oriented to person and place time situation.  She reports ability to sleep through the night.  She denies any suicidality, homicidality or perceptual disturbances.  Currently on leave from work until January and receiving 60% pay. She reports that work has been supportive and plans to return when able. She lives at home with her spouse and daughter, who has moved in to assist with caregiving. Her daughter or husband provides transportation to appointments.     Visit Diagnosis:    ICD-10-CM   1. GAD (generalized anxiety disorder)  F41.1     2. Other specified depressive episodes  F32.89    Depression episode with insufficient symptoms    3. Insomnia due to medical condition  G47.01    mood , headaches    4. Long-term current use of benzodiazepine  Z79.899       Past Psychiatric History: I have reviewed past psychiatric history from progress note on 02/02/2022.  Past trials of medications like Celexa , Xanax , Ambien , Lamictal , propranolol , Vistaril .  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   Cardiac arrhythmia    Nonspecific ST T wave changes on EKG   Chronic  venous insufficiency of lower extremity    Complication of anesthesia    nausea and vomiting   DDD (degenerative disc disease), lumbosacral    Essential hypertension    Headache    History of kidney stones    Hyperlipidemia    Kidney stones    Lymphedema    Migraines    Osteoporosis    PONV (postoperative nausea and vomiting)    Right ureteral stone    Vitamin B12 deficiency    Vitamin D deficiency     Past Surgical History:  Procedure Laterality Date   AUGMENTATION MAMMAPLASTY     CESAREAN SECTION     x 4   COLONOSCOPY WITH PROPOFOL  N/A 03/31/2022   Procedure: COLONOSCOPY WITH PROPOFOL ;  Surgeon: Onita Elspeth Sharper, DO;  Location: [facility];  Service: Gastroenterology;  Laterality: N/A;   CYSTOSCOPY W/ RETROGRADES Bilateral 07/10/2020   Procedure: CYSTOSCOPY WITH RETROGRADE PYELOGRAM;  Surgeon: Francisca Redell BROCKS, MD;  Location: [facility];  Service: Urology;  Laterality: Bilateral;   CYSTOSCOPY/URETEROSCOPY/HOLMIUM LASER/STENT PLACEMENT     CYSTOSCOPY/URETEROSCOPY/HOLMIUM LASER/STENT PLACEMENT Bilateral 07/10/2020   Procedure: CYSTOSCOPY/URETEROSCOPY/HOLMIUM LASER/STENT PLACEMENT;  Surgeon: Francisca Redell BROCKS, MD;  Location: [facility];  Service: Urology;  Laterality: Bilateral;   CYSTOSCOPY/URETEROSCOPY/HOLMIUM LASER/STENT PLACEMENT Right 08/25/2023   Procedure: CYSTOSCOPY/URETEROSCOPY/HOLMIUM LASER;  Surgeon: Francisca Redell BROCKS, MD;  Location: [facility];  Service: Urology;  Laterality: Right;   CYSTOSCOPY/URETEROSCOPY/HOLMIUM LASER/STENT PLACEMENT Right 12/15/2023   Procedure: CYSTOSCOPY/URETEROSCOPY/HOLMIUM LASER;  Surgeon: Francisca Redell BROCKS, MD;  Location: [facility];  Service: Urology;  Laterality: Right;   EXTRACORPOREAL SHOCK WAVE LITHOTRIPSY     x 10 plus   Eye Lift     FACIAL COSMETIC SURGERY     GANGLION CYST EXCISION Right 12/21/2021   Procedure: REMOVAL GANGLION OF WRIST;  Surgeon: Kathlynn Sharper, MD;  Location: [facility];  Service: Orthopedics;  Laterality: Right;    LIPOSUCTION     TONSILLECTOMY     TRIGGER FINGER RELEASE Right 12/21/2021   Procedure: RELEASE TRIGGER FINGER/A-1 PULLEY;  Surgeon: Kathlynn Sharper, MD;  Location: [facility];  Service: Orthopedics;  Laterality: Right;   URETEROSCOPY WITH HOLMIUM LASER LITHOTRIPSY      Family Psychiatric History: I have reviewed family psychiatric history from progress note on 02/02/2022.  Family History:  Family History  Problem Relation Age of Onset   Drug abuse Daughter    Depression Daughter    Depression Son    Bladder Cancer Neg Hx    Kidney cancer Neg Hx    Prostate cancer Neg Hx     Social History: I have reviewed social history from progress note on 02/02/2022. Social History   Socioeconomic History   Marital status: Married    Spouse name: Lynwood   Number of children: 4   Years of education: Not on file   Highest education level: High school graduate  Occupational History   Not on file  Tobacco Use   Smoking status: Never    Passive exposure: Never   Smokeless tobacco: Never  Vaping Use   Vaping status: Never Used  Substance and Sexual Activity   Alcohol use: Not Currently   Drug use:  Not Currently   Sexual activity: Yes  Other Topics Concern   Not on file  Social History Narrative   Not on file   Social Drivers of Health   Financial Resource Strain: Low Risk  (02/12/2024)   [facility]   Overall Financial Resource Strain (CARDIA)    Difficulty of Paying Living Expenses: Not hard at all  Food Insecurity: No Food Insecurity (02/12/2024)   [facility] from [facility]   Hunger Vital Sign    Within the past 12 months, you worried that your food would run out before you got the money to buy more.: Never true    Within the past 12 months, the food you bought just didn't last and you didn't have money to get more.: Never true  Transportation Needs: No Transportation Needs (02/12/2024)   Received from [facility] - Transportation    In the past 12 months, has lack of transportation kept you from medical appointments or from getting medications?: No    Lack of Transportation (Non-Medical): No  Physical Activity: Not on file  Stress: Not on file  Social Connections: Moderately Integrated (12/19/2023)   Social Connection and Isolation Panel    Frequency of Communication with Friends and Family: More than three times a week    Frequency of Social Gatherings with Friends and Family: Once a week    Attends Religious Services: Never    Database administrator or Organizations: Yes    Attends Engineer, structural: More than 4 times per year    Marital Status: Married    Allergies:  Allergies  Allergen Reactions   Percocet [Oxycodone -Acetaminophen ] Itching    Pt tolerates both oxycodone  and tylenol  individually     Metabolic Disorder Labs: Lab Results  Component Value Date   HGBA1C 4.6 (L) 12/18/2023   MPG 85.32 12/18/2023   No results found for: PROLACTIN Lab Results  Component Value Date   CHOL 235 (H) 12/18/2023   TRIG 248 (H) 12/18/2023   HDL 50 12/18/2023   CHOLHDL 4.7 12/18/2023   VLDL 50 (H) 12/18/2023   LDLCALC 135 (H) 12/18/2023   Lab Results  Component Value Date   TSH 1.455 12/18/2023   TSH 2.743 03/08/2022    Therapeutic Level Labs: No results found for: LITHIUM No results found for: VALPROATE No results found for: CBMZ  Current Medications: Current Outpatient Medications  Medication Sig Dispense Refill   amitriptyline  (ELAVIL ) 75 MG tablet Take 75 mg by mouth at bedtime.     aspirin  EC 81 MG tablet Take 1 tablet (81 mg total) by mouth daily. Swallow whole. 30 tablet 12   atorvastatin  (LIPITOR) 40 MG tablet Take 1 tablet (40 mg total) by mouth daily. 90 tablet 0   Biotin 10 MG CAPS Take by mouth.     LORazepam  (ATIVAN ) 0.5 MG tablet TAKE 1 TABLET BY MOUTH AS DIRECTED. TAKE 1 TABLET 3-4 TIMES A WEEK ONLY FOR SEVERE ANXIETY ATTACKS. PLEASE LIMIT  USE, 26 TABLETS MUST LAST 30 DAYS. 26 tablet 0   olmesartan (BENICAR) 5 MG tablet Take 5 mg by mouth daily.     OnabotulinumtoxinA (BOTOX IM) Inject 1 Dose into the muscle every 3 (three) months.     ondansetron  (ZOFRAN -ODT) 8 MG disintegrating tablet Take 8 mg by mouth 3 (three) times daily.     zolpidem  (AMBIEN ) 10 MG tablet Take 1 tablet (10 mg total) by mouth at bedtime. 30  tablet 3   Fremanezumab-vfrm (AJOVY) 225 MG/1.5ML SOAJ Inject 1.5 mLs into the skin. (Patient not taking: Reported on 02/22/2024)     No current facility-administered medications for this visit.     Musculoskeletal: Strength & Muscle Tone: UTA Gait & Station: Seated Patient leans: N/A  Psychiatric Specialty Exam: Review of Systems  Psychiatric/Behavioral:  The patient is nervous/anxious.     There were no vitals taken for this visit.There is no height or weight on file to calculate BMI.  General Appearance: Casual  Eye Contact:  Fair  Speech:  Normal Rate  Volume:  Normal  Mood:  Anxious  Affect:  Congruent  Thought Process:  Goal Directed and Descriptions of Associations: Intact  Orientation:  Full (Time, Place, and Person)  Thought Content: Logical   Suicidal Thoughts:  No  Homicidal Thoughts:  No  Memory:  Immediate;   Limited , reports short term memory loss Recent;   limited Remote;   Fair  Judgement:  Fair  Insight:  Fair  Psychomotor Activity:  Normal  Concentration:  Concentration: Fair and Attention Span: Fair  Recall:  [employer] of Knowledge: Fair  Language: Fair  Akathisia:  No  Handed:  Right  AIMS (if indicated): not done  Assets:  Manufacturing systems engineer Desire for Improvement Housing Social Support Transportation  ADL's:  Intact  Cognition: WNL  Sleep:  Fair   Screenings: GAD-7    Garment/textile technologist Visit from 04/03/2023 in [facility] Visit from 07/25/2022 in [facility] Visit  from 04/25/2022 in [facility] Office Visit from 03/14/2022 in [facility] Office Visit from 02/02/2022 in [facility]  Total GAD-7 Score 18 0 5 20 18    PHQ2-9    Flowsheet Row Office Visit from 04/03/2023 in [facility] Office Visit from 07/25/2022 in [facility] Office Visit from 04/25/2022 in [facility] Office Visit from 02/02/2022 in [facility]  PHQ-2 Total Score 0 0 0 3  PHQ-9 Total Score -- 1 2 13    Flowsheet Row Video Visit from 02/22/2024 in [facility] Video Visit from 01/18/2024 in [facility] ED from 01/01/2024 in [facility] Department at [facility]  C-SSRS RISK CATEGORY No Risk No Risk No Risk     Assessment and Plan: Ashley Collins is a 63 year old Caucasian female on SSI, married, lives in [place], has a history of depression, anxiety, migraine headaches was evaluated by telemedicine today.  Discussed assessment and plan as noted below.  Generalized anxiety disorder-improving Currently reports anxiety symptoms are better managed although interested in addition of an antianxiety agent other than lorazepam .  However worried about side effects especially cognitive side effects.  She would like to hold off on adding medications now. Continue Lorazepam  0.5 mg as needed for severe anxiety attacks Patient to discuss with neurologist regarding changing Amitriptyline  to another medication which does not interact with psychotropics in the future. Discussed referral for psychotherapy however patient declines.  Other specified depressive episode in remission Currently denies any significant depression  symptoms. Continue Amitriptyline  75 mg at bedtime  Insomnia-stable Currently reports sleep is overall good. Continue Ambien  10 mg at bedtime Continue sleep hygiene techniques Reviewed [place] PMP AWARxE   Follow-up Follow-up in clinic in 3 months or sooner  in person  Collaboration of Care: Collaboration of Care: Patient refused AEB declines referral for CBT  Patient/Guardian was advised Release of Information must be obtained prior to any record release in order to collaborate their care with an outside provider. Patient/Guardian was advised if they have not already done so to contact the registration department to sign all necessary forms in order for us  to release information regarding their care.   Consent: Patient/Guardian gives verbal consent for treatment and assignment of benefits for services provided during this visit. Patient/Guardian expressed understanding and agreed to proceed.   This note was generated in part or whole with voice recognition software. Voice recognition is usually quite accurate but there are transcription errors that can and very often do occur. I apologize for any typographical errors that were not detected and corrected.    Daelyn Mozer, MD 02/23/2024, 8:04 AM

## 2024-02-27 NOTE — Therapy (Unsigned)
 OUTPATIENT PHYSICAL THERAPY TREATMENT  Patient Name: Ashley Collins MRN: [id number] DOB:31-Jan-1961, 63 y.o., female Today's Date: 02/28/2024  PCP: Harvey Gaetana CROME, NP REFERRING PROVIDER: Harvey Gaetana CROME, NP, Prentice Reges, DO   END OF SESSION:  PT End of Session - 02/28/24 1332     Visit Number 3    Number of Visits 6    Date for PT Re-Evaluation 03/19/24    Authorization Type [employer] HMO/PPO    Authorization Time Period 02/20/24-03/19/24    Progress Note Due on Visit 10    PT Start Time 1445    PT Stop Time 1527    PT Time Calculation (min) 42 min    Activity Tolerance Patient tolerated treatment well;No increased pain    Behavior During Therapy WFL for tasks assessed/performed           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   Cardiac arrhythmia    Nonspecific ST T wave changes on EKG   Chronic venous insufficiency of lower extremity    Complication of anesthesia    nausea and vomiting   DDD (degenerative disc disease), lumbosacral    Essential hypertension    Headache    History of kidney stones    Hyperlipidemia    Kidney stones    Lymphedema    Migraines    Osteoporosis    PONV (postoperative nausea and vomiting)    Right ureteral stone    Vitamin B12 deficiency    Vitamin D deficiency    Past Surgical History:  Procedure Laterality Date   AUGMENTATION MAMMAPLASTY     CESAREAN SECTION     x 4   COLONOSCOPY WITH PROPOFOL  N/A 03/31/2022   Procedure: COLONOSCOPY WITH PROPOFOL ;  Surgeon: Onita Elspeth Sharper, DO;  Location: [facility]COPY;  Service: Gastroenterology;  Laterality: N/A;   CYSTOSCOPY W/ RETROGRADES Bilateral 07/10/2020   Procedure: CYSTOSCOPY WITH RETROGRADE PYELOGRAM;  Surgeon: Francisca Redell BROCKS, MD;  Location: [facility] ORS;  Service: Urology;  Laterality: Bilateral;   CYSTOSCOPY/URETEROSCOPY/HOLMIUM LASER/STENT PLACEMENT     CYSTOSCOPY/URETEROSCOPY/HOLMIUM LASER/STENT  PLACEMENT Bilateral 07/10/2020   Procedure: CYSTOSCOPY/URETEROSCOPY/HOLMIUM LASER/STENT PLACEMENT;  Surgeon: Francisca Redell BROCKS, MD;  Location: [facility] ORS;  Service: Urology;  Laterality: Bilateral;   CYSTOSCOPY/URETEROSCOPY/HOLMIUM LASER/STENT PLACEMENT Right 08/25/2023   Procedure: CYSTOSCOPY/URETEROSCOPY/HOLMIUM LASER;  Surgeon: Francisca Redell BROCKS, MD;  Location: [facility] ORS;  Service: Urology;  Laterality: Right;   CYSTOSCOPY/URETEROSCOPY/HOLMIUM LASER/STENT PLACEMENT Right 12/15/2023   Procedure: CYSTOSCOPY/URETEROSCOPY/HOLMIUM LASER;  Surgeon: Francisca Redell BROCKS, MD;  Location: [facility] ORS;  Service: Urology;  Laterality: Right;   EXTRACORPOREAL SHOCK WAVE LITHOTRIPSY     x 10 plus   Eye Lift     FACIAL COSMETIC SURGERY     GANGLION CYST EXCISION Right 12/21/2021   Procedure: REMOVAL GANGLION OF WRIST;  Surgeon: Kathlynn Sharper, MD;  Location: [facility] ORS;  Service: Orthopedics;  Laterality: Right;   LIPOSUCTION     TONSILLECTOMY     TRIGGER FINGER RELEASE Right 12/21/2021   Procedure: RELEASE TRIGGER FINGER/A-1 PULLEY;  Surgeon: Kathlynn Sharper, MD;  Location: [facility] ORS;  Service: Orthopedics;  Laterality: Right;   URETEROSCOPY WITH HOLMIUM LASER LITHOTRIPSY     Patient Active Problem List   Diagnosis Date Noted   Acute CVA (cerebrovascular accident) (HCC) 12/19/2023   Hypokalemia 12/19/2023   AKI (acute kidney injury) (HCC) 12/19/2023   Leukocytosis 12/19/2023   Hyperlipidemia, unspecified 12/19/2023   Essential  hypertension 12/19/2023   Vision changes 12/19/2023   Lymphedema 05/22/2023   Chronic venous insufficiency 05/22/2023   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 replacement therapy 04/10/2023   Insomnia due to medical condition 04/25/2022   GAD (generalized anxiety disorder) 02/02/2022   Other specified depressive episodes 02/02/2022   Long-term current use of benzodiazepine 02/02/2022   Migraine with aura and without status migrainosus, not intractable 11/03/2020   Arrhythmia 08/06/2020   Status  migrainosus 02/25/2019   Osteoporosis 04/09/2013   Vitamin D deficiency 04/09/2013   DDD (degenerative disc disease), lumbosacral 02/08/2005   ONSET DATE: July 2025 REFERRING DIAG: s/p CVA; chronic Left knee pain  THERAPY DIAG:  Muscle weakness (generalized)  Difficulty in walking, not elsewhere classified  Low vision, both eyes  Visual impairment  Rationale for Evaluation and Treatment: Rehabilitation  SUBJECTIVE:                                                                                                                                                                                             SUBJECTIVE STATEMENT: Pt reports she continues to have limited confidence in AMB outside of household distances since CVA in July. Wants to improve safety and endurance to allow return to work as soon as possible.   Is curious if the use of treadmill at community fitness center would be safe and beneficial   Pt accompanied by: significant other  PERTINENT HISTORY:   63yoF who sustained a CVA in July 2025 with visual field loss and memory impairment, now followed by [facility] neurology and [facility]mology. Pt has been working with OT here since July, OT noted some episodic imbalance and mild cognitive impairment hence recommended referral to OPPT and OPST. PCP sent referrals for these 3rd week of August. Pt also seen by [facility] sports medicine for chronic Left knee pain s/p remote menisectomy, MRI results pending, also sent a referral to PT for Left knee pain. Pt has memory difficulty since CVA which makes it difficult for her to specific particular deficits or difficulty with mobility since CVA, however OT has seen some episodic stumbling and LOB after bending forward to reach. Pt reports limited confidence in balance overall when walking out of house, will hold onto husband, is startled by new visual input in crowded spaces pertaining to visual deficits. 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   PAIN:  Are you having pain? No  PRECAUTIONS: None   WEIGHT BEARING RESTRICTIONS: No  FALLS: Has patient fallen in last 6  months? 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   PATIENT GOALS: Return to work, full confident mobility/activity.   OBJECTIVE:  Note: Objective measures were completed at Evaluation unless otherwise noted.  DIAGNOSTIC FINDINGS: Left knee MRI results pending.   COGNITION: Overall cognitive status: mild cognitive impairment s/p CVA in July, being seen for visual.cognitive remediation with OT and ST.    SENSATION: No reported deficits, not assessed.   EDEMA:  Mild focal edema to the anterior knee extensor mechanism, non tender, reported as chronic.   KNEE ROM:   0->130 degrees left knee flexion, no pain on inspection.   FUNCTIONAL MOBILITY ASSESSMENT:  1. Overground AMB, shod, level surface: no frank deviation or asymmetry, no LOB, gait speed appropriate to situation, age.   2. : 0.68m/s  3. retro: 0.64m/s (supervision level, no LOB)   4. 30sec chair rise: hands free, standardized height, 11x; no c/o of knee pain, mild asymmetry, mild falls anxiety without LOB  5. SLS balance: RLE: >30sec; LLE: >25sec (minGuardA)   6. Eyes closed balance on foam surface: 60sec, moderately increased sway without frank LOB   7. Curb step up/down (6): 3x each without LOB with modI 180 degree turns   8. Visual scanning and navigation about a congested area in [employer] at lunch time: adequate path navigation and obstacle avoidance.   9. Walking eyes closed x66ft: no LOB, straight line of progression, <25% change in gait speed   [facility] PT Assessment - 02/20/24 0001        Functional Gait  Assessment   Gait assessed  Yes    Gait Level Surface Walks 20 ft in less than 5.5 sec, no assistive devices, good speed, no evidence for imbalance, normal gait pattern, deviates no more than 6 in outside of the 12 in walkway width.    Change in Gait Speed Able to smoothly change walking speed without loss of balance or gait deviation. Deviate no more than 6 in outside of the 12 in walkway width.    Gait with Horizontal Head Turns Performs head turns smoothly with no change in gait. Deviates no more than 6 in outside 12 in walkway width    Gait with Vertical Head Turns Performs head turns with no change in gait. Deviates no more than 6 in outside 12 in walkway width.    Gait and Pivot Turn Pivot turns safely within 3 sec and stops quickly with no loss of balance.    Step Over Obstacle Is able to step over 2 stacked shoe boxes taped together (9 in total height) without changing gait speed. No evidence of imbalance.    Gait with Narrow Base of Support Is able to ambulate for 10 steps heel to toe with no staggering.    Gait with Eyes Closed Walks 20 ft, no assistive devices, good speed, no evidence of imbalance, normal gait pattern, deviates no more than 6 in outside 12 in walkway width. Ambulates 20 ft in less than 7 sec.    Ambulating Backwards Walks 20 ft, no assistive devices, good speed, no evidence for imbalance, normal gait    Steps Alternating feet, no rail.    Total Score 30       [facility] PT Assessment - 02/21/24 0001       Mini-BESTest   Sit  To Stand Normal: Comes to stand without use of hands and stabilizes independently.    Rise to Toes Normal: Stable for 3 s with maximum height.    Stand on one leg (left) Normal: 20 s.    Stand on one leg (right) Moderate: < 20 s    Stand on one leg - lowest score 1    Compensatory Stepping Correction - Forward Normal: Recovers independently with a single, large step (second realignement is allowed).    Compensatory Stepping  Correction - Backward No step, OR would fall if not caught, OR falls spontaneously.    Compensatory Stepping Correction - Left Lateral Moderate: Several steps to recover equilibrium    Compensatory Stepping Correction - Right Lateral Moderate: Several steps to recover equilibrium    Stepping Corredtion Lateral - lowest score 1    Stance - Feet together, eyes open, firm surface  Normal: 30s    Stance - Feet together, eyes closed, foam surface  Moderate: < 30s    Incline - Eyes Closed Moderate: Stands independently < 30s OR aligns with surface    Change in Gait Speed Moderate: Unable to change walking speed or signs of imbalance    Walk with head turns - Horizontal Normal: performs head turns with no change in gait speed and good balance    Walk with pivot turns Normal: Turns with feet close FAST (< 3 steps) with good balance.    Step over obstacles Moderate: Steps over box but touches box OR displays cautious behavior by slowing gait.    Timed UP & GO with Dual Task Severe: Stops counting while walking OR stops walking while counting.   9.91 sec 17.25sec   Mini-BEST total score 18        MCID (Godi,et al, 2013)  Clinically meaningful change is improvement of 4 points (out of 28 total)                                                                                                                             TREATMENT DATE 02/28/24:   NMR: To facilitate reeducation of movement, balance, posture, coordination, and/or proprioception/kinesthetic sense.   Kore balance trainer working on patients proprioception and standing balance on dynamic surface  Setting type(s): Tux Racer   Reps: 5 Comments: bunny slope and in search of vodka slope     Activity Description: blaze pod taps on mirror - one in midline and other at edge of mirror to promote visual scanning  Activity Setting:  random  Number of Pods:  3 Cycles/Sets:  3 Duration (Time or Hit Count):  2 min Comments: added airex beam on  second round and changed to focus setting    Weighted gait with obstacle navigation x 8 min with 3# AW - no LOB  Square walking - forward, retro, and sidestepping   Unless otherwise stated, CGA was provided and gait belt donned in order to ensure pt safety    PATIENT  EDUCATION: Education details: findings for balance deficits through minibest and how it relates to functional balance at work.  encouraged to initiate walking program on treadmill.  Person educated: Patient, husband Education method: collaborative learning, deliberate practice, positive reinforcement, explicit instruction, establish rules. Education comprehension: verbalized understanding  HOME EXERCISE PROGRAM: Repeat exposure to public spaces up to 1 hours outings to practice spatial navigation, awareness, and visual reintegration. Visual scanning of traffic from passenger seat of car for visual reintegration.   GOALS: Goals reviewed with patient? No  SHORT TERM GOALS: Target date: 02/25/24  Pt to report understanding of importance of progressive reintegration into community outings with progressive increase in length and complexity.  Baseline: Goal status: INITIAL  2.  Pt to reports ability to navigate simple, uncrowded public spaces at supervision level without use of SO for support and with use of railings prn.  Baseline:  Goal status: INITIAL  3.  Pt to report participation in regular long distance walking with SO 3x/week starting at 25 minutes per walk or greater.  Baseline:  Goal status: INITIAL  4. Pt will increase Minibest by > 4 points to indicate improved safety with community mobility and reduced fall risk.  Baseline: 18 Goal status: initial    ASSESSMENT:  CLINICAL IMPRESSION:  Patient presents with good motivation for completion of physical therapy activities. Pt challenged with visual re-integration activities and did well with all challenges. Will continue to progress in future sessions. The  patient demonstrated significant progress while utilizing [employer], showcasing improved coordination, balance, and cognitive function. The incorporation of dual-tasking technology with color recognition and association with specific movements in Blaze Pods was strategically chosen to provide a dynamic training environment, enabling the patient to engage in simultaneous physical and cognitive tasks. This unique approach enhances not only their physical abilities but also fosters increased neural connectivity and mental awareness, contributing to a well-rounded and effective rehabilitation and training experience. Pt will continue to benefit from skilled physical therapy intervention to address impairments, improve QOL, and attain therapy goals.     OBJECTIVE IMPAIRMENTS: decreased activity tolerance, decreased cognition, decreased endurance, difficulty walking, decreased safety awareness, and impaired perceived functional ability.   ACTIVITY LIMITATIONS: bending and stairs  PARTICIPATION LIMITATIONS: medication management, interpersonal relationship, driving, shopping, community activity, and occupation  PERSONAL FACTORS: Profession and Time since onset of injury/illness/exacerbation are also affecting patient's functional outcome.   REHAB POTENTIAL: Poor  CLINICAL DECISION MAKING: Evolving/moderate complexity  EVALUATION COMPLEXITY: High  PLAN:  PT FREQUENCY: 1-2x/week  PT DURATION: 4 weeks  PLANNED INTERVENTIONS: 97750- Physical Performance Testing, 97110-Therapeutic exercises, 97530- Therapeutic activity, 97112- Neuromuscular re-education, 97535- Self Care, 02859- Manual therapy, [id number]- Gait training, Patient/Family education, Balance training, Stair training, and Cognitive remediation  PLAN FOR NEXT SESSION:   visual navigation + scanning/ dual tasking.  High level balance training.   5:00 PM, 02/28/24 [facility]  Outpatient Physical Therapy- Main [address]    Lonni KATHEE Gainer, [place] 02/28/2024, 5:00 PM

## 2024-02-28 ENCOUNTER — Encounter: Payer: Self-pay | Admitting: Urology

## 2024-02-28 ENCOUNTER — Ambulatory Visit: Attending: Family Medicine | Admitting: Occupational Therapy

## 2024-02-28 ENCOUNTER — Ambulatory Visit

## 2024-02-28 ENCOUNTER — Ambulatory Visit: Admitting: Physical Therapy

## 2024-02-28 DIAGNOSIS — H547 Unspecified visual loss: Secondary | ICD-10-CM

## 2024-02-28 DIAGNOSIS — R262 Difficulty in walking, not elsewhere classified: Secondary | ICD-10-CM

## 2024-02-28 DIAGNOSIS — H543 Unqualified visual loss, both eyes: Secondary | ICD-10-CM | POA: Diagnosis present

## 2024-02-28 DIAGNOSIS — R41841 Cognitive communication deficit: Secondary | ICD-10-CM

## 2024-02-28 DIAGNOSIS — M6281 Muscle weakness (generalized): Secondary | ICD-10-CM | POA: Insufficient documentation

## 2024-02-28 DIAGNOSIS — R278 Other lack of coordination: Secondary | ICD-10-CM | POA: Insufficient documentation

## 2024-02-28 NOTE — Therapy (Signed)
 OUTPATIENT SPEECH LANGUAGE PATHOLOGY  TREATMENT   Patient Name: Ashley Collins MRN: [id number] DOB:[birth year], 63 y.o., female Today's Date: 02/28/2024  PCP: Gaetana Haddock, NP  REFERRING PROVIDER: same   End of Session - 02/28/24 1312     Visit Number 3    Number of Visits 24    Date for SLP Re-Evaluation 05/14/24    SLP Start Time 1315    SLP Stop Time  1400    SLP Time Calculation (min) 45 min    Activity Tolerance Patient tolerated treatment well           Patient Active Problem List   Diagnosis Date Noted   Acute CVA (cerebrovascular accident) (HCC) 12/19/2023   Hypokalemia 12/19/2023   AKI (acute kidney injury) (HCC) 12/19/2023   Leukocytosis 12/19/2023   Hyperlipidemia, unspecified 12/19/2023   Essential hypertension 12/19/2023   Vision changes 12/19/2023   Lymphedema 05/22/2023   Chronic venous insufficiency 05/22/2023   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 replacement therapy 04/10/2023   Insomnia due to medical condition 04/25/2022   GAD (generalized anxiety disorder) 02/02/2022   Other specified depressive episodes 02/02/2022   Long-term current use of benzodiazepine 02/02/2022   Migraine with aura and without status migrainosus, not intractable 11/03/2020   Arrhythmia 08/06/2020   Status migrainosus 02/25/2019   Osteoporosis 04/09/2013   Vitamin D deficiency 04/09/2013   DDD (degenerative disc disease), lumbosacral 02/08/2005    ONSET DATE: 12/19/23   REFERRING DIAG: CVA- memory deficits  THERAPY DIAG:  Cognitive communication deficit  Rationale for Evaluation and Treatment Rehabilitation  SUBJECTIVE:   SUBJECTIVE STATEMENT: Pt alert, pleasant, and cooperative. Pt accompanied by: self and significant other  PERTINENT HISTORY & DIAGNOSTIC FINDINGS: Pt is 63 y.o. female who presents today for a cognitive-communication evaluation in setting of stroke. MRI 12/18/23 1. 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 No  associated hemorrhage or mass effect. PMHx as outlined above.  PAIN:  Are you having pain? No   FALLS: Has patient fallen in last 6 months?  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   PATIENT GOALS  to return to PLOF   OBJECTIVE:  Reviewed concept of use of journaling to improve short term and prospective memory. Pt has been journaling to improve short term and prospective memory. Reviewed attention strategies such as telling self ot mentally focus and to narrate actions aloud (e.g. when cooking, placing items down, or while going to get something from another room). Pt endorsed utilizing at home with success. Introduced modified Pomodoro method with pt taking regularly schedule rest breaks. Pt to schedule day utilizing calendar as if she was at work with consideration for priority tasks and building in rest breaks. Introduced types of memory and external memory strategies. Pt already employing many external memory strategies, will discuss internal strategies and considerations for multitasking in upcoming sessions. SLP to f/u re: use of strategies in upcoming sessions.   PATIENT EDUCATION: Education details: as above Person educated: Patient and Spouse Education method: Explanation; Handout Education comprehension: verbalized understanding  HOME EXERCISE PROGRAM:        Practice narrating actions  Telling self to mentally focus  Initiate journaling for short term and prospective memory    GOALS:  Goals reviewed with patient? Yes  SHORT TERM GOALS: Target date: 10  sessions  Pt will complete PROM re: memory.  Baseline: Goal status: INITIAL   2.  Pt will endorse successful implementation of at least x2 compensations for attention and memory.  Baseline:   Goal status: INITIAL  3.  With Moderate A, patient will establish external aid for memory/executive function and bring to more than 50% of therapy sessions.    Baseline:  Goal status: INITIAL    LONG TERM GOALS: Target date: 12 weeks  Pt will endorse improvement in cognitive-communication per PROM.  Baseline:  Goal status: INITIAL  2.  Pt and/or husband will demonstrate understanding of ways to promote and support cognitive-communication outside of SLP sessions.  Baseline:  Goal status: INITIAL   ASSESSMENT:  CLINICAL IMPRESSION:  Pt is 63 y.o. female who presents today for a cognitive-communication treatment in setting of stroke. Initial assessment completed via formal means (Cognitive-Linguistic Quick Test) and PROM (Neuro-QoL Adult Cognitive Function v2.0). Pt presents with at least mild cognitive-communication deficits affecting attention and visuospatial skills per CLQT as well as memory and executive functioning per PROM. Suspect CLQT is not as sensitive to higher level cognitive-communication deficits appreciated by pt during iADLs. See details of today's tx as outlined above. Recommend skilled ST services targeting above mentioned deficits to improve QoL and performance on ADLs/IADLs.  OBJECTIVE IMPAIRMENTS include attention, memory, executive functioning, and visuospatial deficits. These impairments are limiting patient from return to work, managing appointments, household responsibilities, and ADLs/IADLs. Factors affecting potential to achieve goals and functional outcome are medical prognosis.. Patient will benefit from skilled SLP services to address above impairments and improve overall function.  REHAB POTENTIAL: Good  PLAN: SLP FREQUENCY: 2x/week  SLP DURATION: 12 weeks  PLANNED INTERVENTIONS: Cueing hierachy, Cognitive reorganization, Internal/external aids, Functional tasks, SLP instruction and feedback, Compensatory strategies, and Patient/family  education    Delon Bangs, M.S., CCC-SLP Speech-Language Pathologist [facility] [phone] [place])  [facility] Outpatient Rehabilitation at [facility] [address] [place], [place], [address] Phone: [phone]   Fax:  [phone]

## 2024-02-28 NOTE — Therapy (Addendum)
 OCCUPATIONAL THERAPY NEURO TREATMENT NOTE  Patient Name: Ashley Collins MRN: [id number] DOB:Jul 22, 1960, 63 y.o., female Today's Date: 02/28/2024  PCP: Harvey Gaetana CROME, NP REFERRING PROVIDER: Harvey Gaetana CROME, NP   OT End of Session - 02/28/24 1645     Visit Number 18    Number of Visits 24    Date for OT Re-Evaluation 03/20/24    OT Start Time 1400    OT Stop Time 1445    OT Time Calculation (min) 45 min    Activity Tolerance Patient tolerated treatment well    Behavior During Therapy WFL for tasks assessed/performed             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   Cardiac arrhythmia    Nonspecific ST T wave changes on EKG   Chronic venous insufficiency of lower extremity    Complication of anesthesia    nausea and vomiting   DDD (degenerative disc disease), lumbosacral    Essential hypertension    Headache    History of kidney stones    Hyperlipidemia    Kidney stones    Lymphedema    Migraines    Osteoporosis    PONV (postoperative nausea and vomiting)    Right ureteral stone    Vitamin B12 deficiency    Vitamin D deficiency    Past Surgical History:  Procedure Laterality Date   AUGMENTATION MAMMAPLASTY     CESAREAN SECTION     x 4   COLONOSCOPY WITH PROPOFOL  N/A 03/31/2022   Procedure: COLONOSCOPY WITH PROPOFOL ;  Surgeon: Onita Elspeth Sharper, DO;  Location: [facility] ENDOSCOPY;  Service: Gastroenterology;  Laterality: N/A;   CYSTOSCOPY W/ RETROGRADES Bilateral 07/10/2020   Procedure: CYSTOSCOPY WITH RETROGRADE PYELOGRAM;  Surgeon: Francisca Redell BROCKS, MD;  Location: [facility] ORS;  Service: Urology;  Laterality: Bilateral;   CYSTOSCOPY/URETEROSCOPY/HOLMIUM LASER/STENT PLACEMENT     CYSTOSCOPY/URETEROSCOPY/HOLMIUM LASER/STENT PLACEMENT Bilateral 07/10/2020   Procedure: CYSTOSCOPY/URETEROSCOPY/HOLMIUM LASER/STENT PLACEMENT;  Surgeon: Francisca Redell BROCKS, MD;  Location: [facility] ORS;  Service: Urology;   Laterality: Bilateral;   CYSTOSCOPY/URETEROSCOPY/HOLMIUM LASER/STENT PLACEMENT Right 08/25/2023   Procedure: CYSTOSCOPY/URETEROSCOPY/HOLMIUM LASER;  Surgeon: Francisca Redell BROCKS, MD;  Location: [facility] ORS;  Service: Urology;  Laterality: Right;   CYSTOSCOPY/URETEROSCOPY/HOLMIUM LASER/STENT PLACEMENT Right 12/15/2023   Procedure: CYSTOSCOPY/URETEROSCOPY/HOLMIUM LASER;  Surgeon: Francisca Redell BROCKS, MD;  Location: [facility] ORS;  Service: Urology;  Laterality: Right;   EXTRACORPOREAL SHOCK WAVE LITHOTRIPSY     x 10 plus   Eye Lift     FACIAL COSMETIC SURGERY     GANGLION CYST EXCISION Right 12/21/2021   Procedure: REMOVAL GANGLION OF WRIST;  Surgeon: Kathlynn Sharper, MD;  Location: [facility] ORS;  Service: Orthopedics;  Laterality: Right;   LIPOSUCTION     TONSILLECTOMY     TRIGGER FINGER RELEASE Right 12/21/2021   Procedure: RELEASE TRIGGER FINGER/A-1 PULLEY;  Surgeon: Kathlynn Sharper, MD;  Location: [facility] ORS;  Service: Orthopedics;  Laterality: Right;   URETEROSCOPY WITH HOLMIUM LASER LITHOTRIPSY     Patient Active Problem List   Diagnosis Date Noted   Acute CVA (cerebrovascular accident) (HCC) 12/19/2023   Hypokalemia 12/19/2023   AKI (acute kidney injury) (HCC) 12/19/2023   Leukocytosis 12/19/2023   Hyperlipidemia, unspecified 12/19/2023   Essential hypertension 12/19/2023   Vision changes 12/19/2023   Lymphedema 05/22/2023   Chronic venous insufficiency 05/22/2023   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 replacement therapy 04/10/2023   Insomnia due  to medical condition 04/25/2022   GAD (generalized anxiety disorder) 02/02/2022   Other specified depressive episodes 02/02/2022   Long-term current use of benzodiazepine 02/02/2022   Migraine with aura and without status migrainosus, not intractable 11/03/2020   Arrhythmia 08/06/2020   Status migrainosus 02/25/2019   Osteoporosis 04/09/2013   Vitamin D deficiency 04/09/2013   DDD (degenerative disc disease), lumbosacral 02/08/2005   ONSET DATE:  12/18/2023  REFERRING DIAG:   THERAPY DIAG:  Muscle weakness (generalized)  Rationale for Evaluation and Treatment: Rehabilitation  SUBJECTIVE:  SUBJECTIVE STATEMENT: Pt. has a speech therapy evaluation this afternoon. Pt accompanied by: significant other  PERTINENT HISTORY: Pt. has Hx of stroke with onset 12/18/2023. Pt. PMHx includes: HTN, Hyperlipidemia, Kidney Stones, near syncopal event, loss of vision.  PRECAUTIONS: None  WEIGHT BEARING RESTRICTIONS:  PAIN:  Are you having pain? No  FALLS: Has patient fallen in last 6 months? Yes. Number of falls    LIVING ENVIRONMENT: Lives with: lives with their family Lives in: [place], [place] Stairs: No, inside the house, yes but does not use Has following equipment at home:   PLOF: Independent  PATIENT GOALS: To be able to see  OBJECTIVE:  Note: Objective measures were completed at Evaluation unless otherwise noted.  HAND DOMINANCE: Right  ADLs:  Eating: Drinking from straw is different, able to use utensils.  Grooming: Fatigues completing hair care.  UB Dressing: independent, if clothes are in front of her she can find clothing LB Dressing: Independent Toileting: Independent Bathing: Independent Tub Shower transfers: Independent Equipment: none Has difficulty with using cell phone  IADLs: Shopping: Does not typically go out shopping, and has not tried to. Light housekeeping: Pt. reports that she does not typically do house cleaning. Pt. has been able to unpack belongings from boxes due to her recent move in. Meal Prep: Pt. reports that is does not currently cook, however reports that she probably could. Has difficulty opening bottle caps/lids. Community mobility: Requires assistance from husband to navigate  through buildings, negotiate stairs-Pt. With increased fear of falling  Medication management: Able to push down medication bottles to open  Financial management: No change in the process-uses automatic bill pay  system.  Has difficulty with using cell phone Handwriting: N/T Work: Pt. was actively working managing a Health visitor, works a lot of hours  MOBILITY STATUS: Independent and Needs Assist: Requires hand on hand assistance with nvigating through environments.   POSTURE COMMENTS:  No Significant postural limitations Sitting balance: Good  ACTIVITY TOLERANCE: Activity tolerance: Good  FUNCTIONAL OUTCOME MEASURES:   UPPER EXTREMITY ROM:    Active ROM Right Eval WFL Left Eval [facility]  Shoulder flexion    Shoulder abduction    Shoulder adduction    Shoulder extension    Shoulder internal rotation    Shoulder external rotation    Elbow flexion    Elbow extension    Wrist flexion    Wrist extension    Wrist ulnar deviation    Wrist radial deviation    Wrist pronation    Wrist supination    (Blank rows = not tested)  UPPER EXTREMITY MMT:     MMT Right eval Right Left eval Left  Shoulder flexion 4-/5 5/5 4-/5 5/5  Shoulder abduction 4-/5 5/5 4-/5 5/5  Shoulder adduction      Shoulder extension      Shoulder internal rotation      Shoulder external rotation      Middle trapezius  Lower trapezius      Elbow flexion 5/5 5/5 5/5 5/5  Elbow extension 5/5 5/5 5/5 5/5  Wrist flexion 4/5 5/5 4-/5 5/5  Wrist extension 4/5 5/5 4-/5 5/5  Wrist ulnar deviation      Wrist radial deviation      Wrist pronation      Wrist supination      (Blank rows = not tested)  HAND FUNCTION: Grip strength: Right: 39 lbs; Left: 28 lbs, Lateral pinch: Right: 11 lbs, Left: 9 lbs, and 3 point pinch: Right: 9 lbs, Left: 9 lbs  01/29/24 Grip strength: Right: 39 lbs; Left: 36 lbs, Lateral pinch: Right: 14 lbs, Left: 12 lbs, and 3 point pinch: Right: 12 lbs, Left: 11 lbs   COORDINATION: 9 Hole Peg test: Right: 30 sec; Left: 36 sec  01/29/24 9 Hole Peg test: Right: 24 sec; Left: 23 sec   SENSATION: WFL  EDEMA:   MUSCLE TONE:   COGNITION: Overall cognitive status: Within  functional limits for tasks assessed  VISION: Subjective report: Pt. Reports changes in her vision at onset of stroke, starting with blurry vision, resulting in vision loss in the R eye.  Baseline vision: Pt. Is able to visually track  Visual history: Hx of eye surgery  VISION ASSESSMENT: TBD  PERCEPTION: WFL  PRAXIS: WFL  OBSERVATIONS:                                                                                                                    TREATMENT DATE: 02/28/24   Therapeutic Activities:   -Facilitated visual perceptual visual motor skills copying complex images with emphasis on refining the details of each image with aligning points, and spatially being able to determine where the components of the design shapes meet. Pt. worked on Actuary prior to copying them requiring consistent cueing for accuracy.  -Pt. worked on identifying detail omissions for each image requiring cues requiring cues.   Education details: Condition management/visual compensation strategies Person educated: Patient and Spouse Education method: Explanation and Verbal cues Education comprehension: verbalized understanding and returned demonstration  HOME EXERCISE PROGRAM:  -visual motor tasks.SABRA    GOALS: Goals reviewed with patient? Yes  SHORT TERM GOALS: Target date: 02/06/2024    Pt. Will independently utilize HEP for hand strength, coordination, and visual compensatory strategies ADL/ADLs Baseline: Eval: No  current HEP  Goal status: INITIAL   LONG TERM GOALS: Target date: 03/19/2024    Pt. Will be able to independently implement visual scanning/visual search compensatory strategies for tasks within her extra personal space navigating through community environments 100% of the time.  Baseline: 01/29/24:  Independent  100% of the time within the therapy gym, and hallway. Eval: Pt. Requires increased assist to navigate through community environments. Goal status:  Achieved  2.  Pt. Will be able to independently utilize visual scanning/visual search strategies  during ADLS/IADL within her near space, and during tabletop top tasks. 100% of the time.  Baseline: 01/29/24: 75% Eval: Pt. Education to  be provided.  Goal status: INITIAL  3.  Pt. Will be able to independently initiate visual scanning techniques in her own environment to reduce risk of falls.  Baseline: 01/29/24: Education was provided, Pt. Is utlizing visual compensatory strategies/visual scanning with in her home. Eval: Pt. Education to be provided.  Goal status: Achieved  4.  Pt. Will increase BUE Grip Strength by 5# to be able to independently hold objects for ADL/IADL use. Baseline: 01/29/24: Grip strength: Right: 39 lbs; Left: 28 lbs, Eval: Right Grip: 39#, Left Grip: 28# Goal status: Achieved  5.  Pt. Will increase L Lateral Pinch strength by 2# to be able to independently twist off bottle caps/lids. Baseline: 01/29/24: Lateral pinch: Right: 14 lbs, Left: 12 lbs Eval: Lateral pinch: Right: 11 lbs, Left: 9 lbs  Goal status: Achieved  6.  Pt. Will increase BUE strength for shoulder flexion/abduction by 2 mm grades to independently complete hair care tasks. Baseline: 01/29/24: 5/5 overall Eval: Right Shoulder Flexion: 4-/5, Left Shoulder Flexion:4-/5, Right Shoulder Abduction: 4-/5, Left Shoulder Abduction: 4-/5 Goal status: Achieved  7.  Pt will be indep with medication set up using weekly pill organizer.  Baseline: 01/29/24: Independent 01/15/24: Daughter currently manages medication set up.  Goal status: Achieved  8. Pt will increase typing speed to 20-30 words per minute with at least 90% accuracy to work towards more efficient typing for job related  responsibilities.  Baseline: 01/29/24: Continue 01/15/24: 8wpm x88% accuracy=7 wpm  Goal status: Achieved  9. Pt will be able to scan small print on store receipts to check for errors with 100% accuracy using visual compensation strategies as  needed.  Baseline: 01/29/24: Pt. Continues to have difficulty scanning small print items. 01/15/24: Not yet attempted; required for return to work/job responsibility  Goal status:  Ongoing  10. Pt. Will independently identify 8 items consistently form visual visual memory in preparation or ADLs.    Baseline: 01/29/24: Pt. Is able to identify 6 items consistently from visual memory.    Gaol status: New ASSESSMENT: CLINICAL IMPRESSION:   Pt. presented with difficulty copying more complex designs after first tracing them, having the most difficulty spatially being able connect the component shapes on the design.  Pt. continues to present with difficulty aligning the lines accurately at the same point. Pt. was able to accurately measure each design using a standard ruler. Pt. continues to benefit from Occupational Therapy services to improve her ability to use visual compensatory strategies, and improve overall BUE functioning in order to improve engagement in, and maximize overall independence with ADL, and IADL tasks.  PERFORMANCE DEFICITS: in functional skills including ADLs, IADLs, coordination, dexterity, Fine motor control, and vision, and psychosocial skills including coping strategies, environmental adaptation, habits, interpersonal interactions, and routines and behaviors.   IMPAIRMENTS: are limiting patient from ADLs, IADLs, rest and sleep, work, leisure, and social participation.   CO-MORBIDITIES: may have co-morbidities  that affects occupational performance. Patient will benefit from skilled OT to address above impairments and improve overall function.  MODIFICATION OR ASSISTANCE TO COMPLETE EVALUATION: Min-Moderate modification of tasks or assist with assess necessary to complete an evaluation.  OT OCCUPATIONAL PROFILE AND HISTORY: Detailed assessment: Review of records and additional review of physical, cognitive, psychosocial history related to current functional performance.  CLINICAL  DECISION MAKING: Moderate - several treatment options, min-mod task modification necessary  REHAB POTENTIAL: Good  EVALUATION COMPLEXITY: Moderate    PLAN:  OT FREQUENCY: 2x/week  OT DURATION: 12 weeks  PLANNED INTERVENTIONS: 02831 OT  Re-evaluation, 97535 self care/ADL training, 02889 therapeutic exercise, 97530 therapeutic activity, 97112 neuromuscular re-education, visual/perceptual remediation/compensation, patient/family education, and DME and/or AE instructions  RECOMMENDED OTHER SERVICES: ST  CONSULTED AND AGREED WITH PLAN OF CARE: Patient and family member/caregiver  PLAN FOR NEXT SESSION: Treatment  Richardson Otter, MS, OTR/L   02/28/24, 4:47 PM

## 2024-03-04 ENCOUNTER — Ambulatory Visit: Admitting: Occupational Therapy

## 2024-03-04 ENCOUNTER — Ambulatory Visit

## 2024-03-04 DIAGNOSIS — M6281 Muscle weakness (generalized): Secondary | ICD-10-CM

## 2024-03-04 DIAGNOSIS — H543 Unqualified visual loss, both eyes: Secondary | ICD-10-CM

## 2024-03-04 DIAGNOSIS — R262 Difficulty in walking, not elsewhere classified: Secondary | ICD-10-CM

## 2024-03-04 DIAGNOSIS — R278 Other lack of coordination: Secondary | ICD-10-CM

## 2024-03-04 DIAGNOSIS — R41841 Cognitive communication deficit: Secondary | ICD-10-CM

## 2024-03-04 DIAGNOSIS — H547 Unspecified visual loss: Secondary | ICD-10-CM

## 2024-03-04 NOTE — Therapy (Signed)
 OCCUPATIONAL THERAPY NEURO TREATMENT NOTE  Patient Name: Ashley Collins MRN: [id number] DOB:1961-04-24, 63 y.o., female Today's Date: 03/04/2024  PCP: Harvey Gaetana CROME, NP REFERRING PROVIDER: Harvey Gaetana CROME, NP   OT End of Session - 03/04/24 0854     Visit Number 19    Number of Visits 24    Date for OT Re-Evaluation 03/20/24    OT Start Time 0845    OT Stop Time 0930    OT Time Calculation (min) 45 min    Activity Tolerance Patient tolerated treatment well    Behavior During Therapy WFL for tasks assessed/performed             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   Cardiac arrhythmia    Nonspecific ST T wave changes on EKG   Chronic venous insufficiency of lower extremity    Complication of anesthesia    nausea and vomiting   DDD (degenerative disc disease), lumbosacral    Essential hypertension    Headache    History of kidney stones    Hyperlipidemia    Kidney stones    Lymphedema    Migraines    Osteoporosis    PONV (postoperative nausea and vomiting)    Right ureteral stone    Vitamin B12 deficiency    Vitamin D deficiency    Past Surgical History:  Procedure Laterality Date   AUGMENTATION MAMMAPLASTY     CESAREAN SECTION     x 4   COLONOSCOPY WITH PROPOFOL  N/A 03/31/2022   Procedure: COLONOSCOPY WITH PROPOFOL ;  Surgeon: Onita Elspeth Sharper, DO;  Location: [facility];  Service: Gastroenterology;  Laterality: N/A;   CYSTOSCOPY W/ RETROGRADES Bilateral 07/10/2020   Procedure: CYSTOSCOPY WITH RETROGRADE PYELOGRAM;  Surgeon: Francisca Redell BROCKS, MD;  Location: [facility];  Service: Urology;  Laterality: Bilateral;   CYSTOSCOPY/URETEROSCOPY/HOLMIUM LASER/STENT PLACEMENT     CYSTOSCOPY/URETEROSCOPY/HOLMIUM LASER/STENT PLACEMENT Bilateral 07/10/2020   Procedure: CYSTOSCOPY/URETEROSCOPY/HOLMIUM LASER/STENT PLACEMENT;  Surgeon: Francisca Redell BROCKS, MD;  Location: [facility];  Service: Urology;   Laterality: Bilateral;   CYSTOSCOPY/URETEROSCOPY/HOLMIUM LASER/STENT PLACEMENT Right 08/25/2023   Procedure: CYSTOSCOPY/URETEROSCOPY/HOLMIUM LASER;  Surgeon: Francisca Redell BROCKS, MD;  Location: [facility];  Service: Urology;  Laterality: Right;   CYSTOSCOPY/URETEROSCOPY/HOLMIUM LASER/STENT PLACEMENT Right 12/15/2023   Procedure: CYSTOSCOPY/URETEROSCOPY/HOLMIUM LASER;  Surgeon: Francisca Redell BROCKS, MD;  Location: [facility];  Service: Urology;  Laterality: Right;   EXTRACORPOREAL SHOCK WAVE LITHOTRIPSY     x 10 plus   Eye Lift     FACIAL COSMETIC SURGERY     GANGLION CYST EXCISION Right 12/21/2021   Procedure: REMOVAL GANGLION OF WRIST;  Surgeon: Kathlynn Sharper, MD;  Location: [facility];  Service: Orthopedics;  Laterality: Right;   LIPOSUCTION     TONSILLECTOMY     TRIGGER FINGER RELEASE Right 12/21/2021   Procedure: RELEASE TRIGGER FINGER/A-1 PULLEY;  Surgeon: Kathlynn Sharper, MD;  Location: [facility];  Service: Orthopedics;  Laterality: Right;   URETEROSCOPY WITH HOLMIUM LASER LITHOTRIPSY     Patient Active Problem List   Diagnosis Date Noted   Acute CVA (cerebrovascular accident) (HCC) 12/19/2023   Hypokalemia 12/19/2023   AKI (acute kidney injury) (HCC) 12/19/2023   Leukocytosis 12/19/2023   Hyperlipidemia, unspecified 12/19/2023   Essential hypertension 12/19/2023   Vision changes 12/19/2023   Lymphedema 05/22/2023   Chronic venous insufficiency 05/22/2023   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 replacement therapy 04/10/2023   Insomnia due  to medical condition 04/25/2022   GAD (generalized anxiety disorder) 02/02/2022   Other specified depressive episodes 02/02/2022   Long-term current use of benzodiazepine 02/02/2022   Migraine with aura and without status migrainosus, not intractable 11/03/2020   Arrhythmia 08/06/2020   Status migrainosus 02/25/2019   Osteoporosis 04/09/2013   Vitamin D deficiency 04/09/2013   DDD (degenerative disc disease), lumbosacral 02/08/2005   ONSET DATE:  12/18/2023  REFERRING DIAG:   THERAPY DIAG:  Muscle weakness (generalized)  Rationale for Evaluation and Treatment: Rehabilitation  SUBJECTIVE:  SUBJECTIVE STATEMENT: Pt. reports having a hard time to picture searches at home. Pt accompanied by: significant other  PERTINENT HISTORY: Pt. has Hx of stroke with onset 12/18/2023. Pt. PMHx includes: HTN, Hyperlipidemia, Kidney Stones, near syncopal event, loss of vision.  PRECAUTIONS: None  WEIGHT BEARING RESTRICTIONS:  PAIN:  Are you having pain? No  FALLS: Has patient fallen in last 6 months? Yes. Number of falls    LIVING ENVIRONMENT: Lives with: lives with their family Lives in: [place], [place] Stairs: No, inside the house, yes but does not use Has following equipment at home:   PLOF: Independent  PATIENT GOALS: To be able to see  OBJECTIVE:  Note: Objective measures were completed at Evaluation unless otherwise noted.  HAND DOMINANCE: Right  ADLs:  Eating: Drinking from straw is different, able to use utensils.  Grooming: Fatigues completing hair care.  UB Dressing: independent, if clothes are in front of her she can find clothing LB Dressing: Independent Toileting: Independent Bathing: Independent Tub Shower transfers: Independent Equipment: none Has difficulty with using cell phone  IADLs: Shopping: Does not typically go out shopping, and has not tried to. Light housekeeping: Pt. reports that she does not typically do house cleaning. Pt. has been able to unpack belongings from boxes due to her recent move in. Meal Prep: Pt. reports that is does not currently cook, however reports that she probably could. Has difficulty opening bottle caps/lids. Community mobility: Requires assistance from husband to navigate  through buildings, negotiate stairs-Pt. With increased fear of falling  Medication management: Able to push down medication bottles to open  Financial management: No change in the process-uses automatic  bill pay system.  Has difficulty with using cell phone Handwriting: N/T Work: Pt. was actively working managing a Health visitor, works a lot of hours  MOBILITY STATUS: Independent and Needs Assist: Requires hand on hand assistance with nvigating through environments.   POSTURE COMMENTS:  No Significant postural limitations Sitting balance: Good  ACTIVITY TOLERANCE: Activity tolerance: Good  FUNCTIONAL OUTCOME MEASURES:   UPPER EXTREMITY ROM:    Active ROM Right Eval WFL Left Eval [facility]  Shoulder flexion    Shoulder abduction    Shoulder adduction    Shoulder extension    Shoulder internal rotation    Shoulder external rotation    Elbow flexion    Elbow extension    Wrist flexion    Wrist extension    Wrist ulnar deviation    Wrist radial deviation    Wrist pronation    Wrist supination    (Blank rows = not tested)  UPPER EXTREMITY MMT:     MMT Right eval Right Left eval Left  Shoulder flexion 4-/5 5/5 4-/5 5/5  Shoulder abduction 4-/5 5/5 4-/5 5/5  Shoulder adduction      Shoulder extension      Shoulder internal rotation      Shoulder external rotation      Middle trapezius  Lower trapezius      Elbow flexion 5/5 5/5 5/5 5/5  Elbow extension 5/5 5/5 5/5 5/5  Wrist flexion 4/5 5/5 4-/5 5/5  Wrist extension 4/5 5/5 4-/5 5/5  Wrist ulnar deviation      Wrist radial deviation      Wrist pronation      Wrist supination      (Blank rows = not tested)  HAND FUNCTION: Grip strength: Right: 39 lbs; Left: 28 lbs, Lateral pinch: Right: 11 lbs, Left: 9 lbs, and 3 point pinch: Right: 9 lbs, Left: 9 lbs  01/29/24 Grip strength: Right: 39 lbs; Left: 36 lbs, Lateral pinch: Right: 14 lbs, Left: 12 lbs, and 3 point pinch: Right: 12 lbs, Left: 11 lbs   COORDINATION: 9 Hole Peg test: Right: 30 sec; Left: 36 sec  01/29/24 9 Hole Peg test: Right: 24 sec; Left: 23 sec   SENSATION: WFL  EDEMA:   MUSCLE TONE:   COGNITION: Overall cognitive status:  Within functional limits for tasks assessed  VISION: Subjective report: Pt. Reports changes in her vision at onset of stroke, starting with blurry vision, resulting in vision loss in the R eye.  Baseline vision: Pt. Is able to visually track  Visual history: Hx of eye surgery  VISION ASSESSMENT: TBD  PERCEPTION: WFL  PRAXIS: WFL  OBSERVATIONS:                                                                                                                    TREATMENT DATE: 03/04/24   Therapeutic Activities:   -Facilitated visual scanning tasks at the tabletop  with emphasis focused on visual scanning for more detailed items.  Verbal cues, and modifications were required to reduce the scan plane for increased success. -Facilitated Visual memory tasks, with focus random sequences of letters, and numbers when transferring them from a table to a vertical whiteboard. Pt. Was able to complete a sequence of 4 items consistently, and 5 items inconsistently.  Education details: Condition management/visual compensation strategies, visual memory, and visual scanning tasks. Person educated: Patient and Spouse Education method: Explanation and Verbal cues Education comprehension: verbalized understanding and returned demonstration  HOME EXERCISE PROGRAM:  -visual motor tasks.SABRA    GOALS: Goals reviewed with patient? Yes  SHORT TERM GOALS: Target date: 02/06/2024    Pt. Will independently utilize HEP for hand strength, coordination, and visual compensatory strategies ADL/ADLs Baseline: Eval: No  current HEP  Goal status: INITIAL   LONG TERM GOALS: Target date: 03/19/2024    Pt. Will be able to independently implement visual scanning/visual search compensatory strategies for tasks within her extra personal space navigating through community environments 100% of the time.  Baseline: 01/29/24:  Independent  100% of the time within the therapy gym, and hallway. Eval: Pt. Requires increased  assist to navigate through community environments. Goal status: Achieved  2.  Pt. Will be able to independently utilize visual scanning/visual search strategies  during ADLS/IADL within her near space, and during tabletop top tasks. 100%  of the time.  Baseline: 01/29/24: 75% Eval: Pt. Education to be provided.  Goal status: INITIAL  3.  Pt. Will be able to independently initiate visual scanning techniques in her own environment to reduce risk of falls.  Baseline: 01/29/24: Education was provided, Pt. Is utlizing visual compensatory strategies/visual scanning with in her home. Eval: Pt. Education to be provided.  Goal status: Achieved  4.  Pt. Will increase BUE Grip Strength by 5# to be able to independently hold objects for ADL/IADL use. Baseline: 01/29/24: Grip strength: Right: 39 lbs; Left: 28 lbs, Eval: Right Grip: 39#, Left Grip: 28# Goal status: Achieved  5.  Pt. Will increase L Lateral Pinch strength by 2# to be able to independently twist off bottle caps/lids. Baseline: 01/29/24: Lateral pinch: Right: 14 lbs, Left: 12 lbs Eval: Lateral pinch: Right: 11 lbs, Left: 9 lbs  Goal status: Achieved  6.  Pt. Will increase BUE strength for shoulder flexion/abduction by 2 mm grades to independently complete hair care tasks. Baseline: 01/29/24: 5/5 overall Eval: Right Shoulder Flexion: 4-/5, Left Shoulder Flexion:4-/5, Right Shoulder Abduction: 4-/5, Left Shoulder Abduction: 4-/5 Goal status: Achieved  7.  Pt will be indep with medication set up using weekly pill organizer.  Baseline: 01/29/24: Independent 01/15/24: Daughter currently manages medication set up.  Goal status: Achieved  8. Pt will increase typing speed to 20-30 words per minute with at least 90% accuracy to work towards more efficient typing for job related  responsibilities.  Baseline: 01/29/24: Continue 01/15/24: 8wpm x88% accuracy=7 wpm  Goal status: Achieved  9. Pt will be able to scan small print on store receipts to check for  errors with 100% accuracy using visual compensation strategies as needed.  Baseline: 01/29/24: Pt. Continues to have difficulty scanning small print items. 01/15/24: Not yet attempted; required for return to work/job responsibility  Goal status:  Ongoing  10. Pt. Will independently identify 8 items consistently form visual visual memory in preparation or ADLs.    Baseline: 01/29/24: Pt. Is able to identify 6 items consistently from visual memory.    Gaol status: New ASSESSMENT: CLINICAL IMPRESSION:   Pt. was present without her husband this morning. Pt. reports that he is with their daughter as his memory is worsening, they try not to leave him alone too long. Pt. required the scan plane to be reduced by 1/2 (top to bottom) during the visual scanning/hidden picture task. Pt. was able to more efficiently locate the items in the smaller scan plane. Pt. was able to consistently able to recall a series of 4 letters, attempting 5 inconsistently. Pt. continues to benefit from Occupational Therapy services to improve her ability to use visual compensatory strategies, and improve overall BUE functioning in order to improve engagement in, and maximize overall independence with ADL, and IADL tasks.  PERFORMANCE DEFICITS: in functional skills including ADLs, IADLs, coordination, dexterity, Fine motor control, and vision, and psychosocial skills including coping strategies, environmental adaptation, habits, interpersonal interactions, and routines and behaviors.   IMPAIRMENTS: are limiting patient from ADLs, IADLs, rest and sleep, work, leisure, and social participation.   CO-MORBIDITIES: may have co-morbidities  that affects occupational performance. Patient will benefit from skilled OT to address above impairments and improve overall function.  MODIFICATION OR ASSISTANCE TO COMPLETE EVALUATION: Min-Moderate modification of tasks or assist with assess necessary to complete an evaluation.  OT OCCUPATIONAL  PROFILE AND HISTORY: Detailed assessment: Review of records and additional review of physical, cognitive, psychosocial history related to current functional performance.  CLINICAL  DECISION MAKING: Moderate - several treatment options, min-mod task modification necessary  REHAB POTENTIAL: Good  EVALUATION COMPLEXITY: Moderate    PLAN:  OT FREQUENCY: 2x/week  OT DURATION: 12 weeks  PLANNED INTERVENTIONS: 97168 OT Re-evaluation, 97535 self care/ADL training, 02889 therapeutic exercise, 97530 therapeutic activity, 97112 neuromuscular re-education, visual/perceptual remediation/compensation, patient/family education, and DME and/or AE instructions  RECOMMENDED OTHER SERVICES: ST  CONSULTED AND AGREED WITH PLAN OF CARE: Patient and family member/caregiver  PLAN FOR NEXT SESSION: Treatment  Fraser Busche, MS, OTR/L   03/04/24, 8:59 AM

## 2024-03-04 NOTE — Therapy (Signed)
 OUTPATIENT SPEECH LANGUAGE PATHOLOGY  TREATMENT   Patient Name: Ashley Collins MRN: [id number] DOB:May 20, 1961, 63 y.o., female Today's Date: 03/04/2024  PCP: Gaetana Haddock, NP  REFERRING PROVIDER: same   End of Session - 03/04/24 0754     Visit Number 3    Number of Visits 24    Date for SLP Re-Evaluation 05/14/24    SLP Start Time 0800    SLP Stop Time  0845    SLP Time Calculation (min) 45 min    Activity Tolerance Patient tolerated treatment well           Patient Active Problem List   Diagnosis Date Noted   Acute CVA (cerebrovascular accident) (HCC) 12/19/2023   Hypokalemia 12/19/2023   AKI (acute kidney injury) (HCC) 12/19/2023   Leukocytosis 12/19/2023   Hyperlipidemia, unspecified 12/19/2023   Essential hypertension 12/19/2023   Vision changes 12/19/2023   Lymphedema 05/22/2023   Chronic venous insufficiency 05/22/2023   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 replacement therapy 04/10/2023   Insomnia due to medical condition 04/25/2022   GAD (generalized anxiety disorder) 02/02/2022   Other specified depressive episodes 02/02/2022   Long-term current use of benzodiazepine 02/02/2022   Migraine with aura and without status migrainosus, not intractable 11/03/2020   Arrhythmia 08/06/2020   Status migrainosus 02/25/2019   Osteoporosis 04/09/2013   Vitamin D deficiency 04/09/2013   DDD (degenerative disc disease), lumbosacral 02/08/2005    ONSET DATE: 12/19/23   REFERRING DIAG: CVA- memory deficits  THERAPY DIAG:  Cognitive communication deficit  Rationale for Evaluation and Treatment Rehabilitation  SUBJECTIVE:   SUBJECTIVE STATEMENT: Pt alert, pleasant, and cooperative. Pt accompanied by: self and significant other  PERTINENT HISTORY & DIAGNOSTIC FINDINGS: Pt is 63 y.o. female who presents today for a cognitive-communication evaluation in setting of stroke. MRI 12/18/23 1. 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 No  associated hemorrhage or mass effect. PMHx as outlined above.  PAIN:  Are you having pain? No   FALLS: Has patient fallen in last 6 months?  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   PATIENT GOALS  to return to PLOF   OBJECTIVE:  Reviewed concept of use of journaling to improve short term and prospective memory. Pt has been journaling to improve short term and prospective memory. Reviewed attention strategies such as telling self ot mentally focus and to narrate actions aloud (e.g. when cooking, placing items down, or while going to get something from another room). Pt endorsed utilizing at home with success. Introduced modified Pomodoro method with pt taking regularly schedule rest breaks. Pt to schedule day utilizing calendar as if she was at work with consideration for priority tasks and building in rest breaks. Introduced types of memory and external memory strategies. Introduced internal memory strategies and pt utilized with min during picture retention task (immediately and with 5 minute delay).   PATIENT EDUCATION: Education details: as above Person educated: Patient and Spouse Education method: Explanation; Handout Education comprehension: verbalized understanding  HOME EXERCISE PROGRAM:        Practice narrating actions  Telling self to mentally focus  Initiate journaling for short term and prospective memory    GOALS:  Goals reviewed with patient? Yes  SHORT TERM GOALS: Target date: 10 sessions  Pt will complete PROM re: memory.  Baseline: Goal status: INITIAL   2.  Pt will endorse successful implementation of at least x2 compensations for attention and memory.  Baseline:  Goal status: INITIAL  3.  With Moderate A, patient will establish  external aid for memory/executive function and bring to more than 50% of therapy sessions.    Baseline:  Goal status: INITIAL    LONG TERM GOALS: Target date: 12 weeks  Pt will endorse improvement in cognitive-communication per PROM.  Baseline:  Goal status: INITIAL  2.  Pt and/or husband will demonstrate understanding of ways to promote and support cognitive-communication outside of SLP sessions.  Baseline:  Goal status: INITIAL   ASSESSMENT:  CLINICAL IMPRESSION:  Pt is 63 y.o. female who presents today for a cognitive-communication treatment in setting of stroke. Initial assessment completed via formal means (Cognitive-Linguistic Quick Test) and PROM (Neuro-QoL Adult Cognitive Function v2.0). Pt presents with at least mild cognitive-communication deficits affecting attention and visuospatial skills per CLQT as well as memory and executive functioning per PROM. Suspect CLQT is not as sensitive to higher level cognitive-communication deficits appreciated by pt during iADLs. See details of today's tx as outlined above. Recommend skilled ST services targeting above mentioned deficits to improve QoL and performance on ADLs/IADLs.  OBJECTIVE IMPAIRMENTS include attention, memory, executive functioning, and visuospatial deficits. These impairments are limiting patient from return to work, managing appointments, household responsibilities, and ADLs/IADLs. Factors affecting potential to achieve goals and functional outcome are medical prognosis.. Patient will benefit from skilled SLP services to address above impairments and improve overall function.  REHAB POTENTIAL: Good  PLAN: SLP FREQUENCY: 2x/week  SLP DURATION: 12 weeks  PLANNED INTERVENTIONS: Cueing hierachy, Cognitive reorganization, Internal/external aids, Functional tasks, SLP instruction and feedback, Compensatory strategies, and Patient/family education    Delon Bangs, M.S., CCC-SLP Speech-Language Pathologist [facility] [phone] [place])  [facility] [address] [place], [place], [address] Phone: [phone]   Fax:  [phone]

## 2024-03-04 NOTE — Therapy (Signed)
 OUTPATIENT PHYSICAL THERAPY TREATMENT  Patient Name: Ashley Collins MRN: [id number] DOB:03-13-1961, 63 y.o., female Today's Date: 03/04/2024  PCP: Harvey Gaetana CROME, NP REFERRING PROVIDER: Harvey Gaetana CROME, NP, Prentice Reges, DO   END OF SESSION:  PT End of Session - 03/04/24 0922     Visit Number 4    Number of Visits 6    Date for PT Re-Evaluation 03/19/24    Authorization Type [employer] HMO/PPO    Authorization Time Period 02/20/24-03/19/24    Progress Note Due on Visit 10    PT Start Time 0930    PT Stop Time 1014    PT Time Calculation (min) 44 min    Equipment Utilized During Treatment Gait belt    Activity Tolerance Patient tolerated treatment well;No increased pain    Behavior During Therapy WFL for tasks assessed/performed           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   Cardiac arrhythmia    Nonspecific ST T wave changes on EKG   Chronic venous insufficiency of lower extremity    Complication of anesthesia    nausea and vomiting   DDD (degenerative disc disease), lumbosacral    Essential hypertension    Headache    History of kidney stones    Hyperlipidemia    Kidney stones    Lymphedema    Migraines    Osteoporosis    PONV (postoperative nausea and vomiting)    Right ureteral stone    Vitamin B12 deficiency    Vitamin D deficiency    Past Surgical History:  Procedure Laterality Date   AUGMENTATION MAMMAPLASTY     CESAREAN SECTION     x 4   COLONOSCOPY WITH PROPOFOL  N/A 03/31/2022   Procedure: COLONOSCOPY WITH PROPOFOL ;  Surgeon: Onita Elspeth Sharper, DO;  Location: [facility];  Service: Gastroenterology;  Laterality: N/A;   CYSTOSCOPY W/ RETROGRADES Bilateral 07/10/2020   Procedure: CYSTOSCOPY WITH RETROGRADE PYELOGRAM;  Surgeon: Francisca Redell BROCKS, MD;  Location: [facility];  Service: Urology;  Laterality: Bilateral;   CYSTOSCOPY/URETEROSCOPY/HOLMIUM LASER/STENT PLACEMENT      CYSTOSCOPY/URETEROSCOPY/HOLMIUM LASER/STENT PLACEMENT Bilateral 07/10/2020   Procedure: CYSTOSCOPY/URETEROSCOPY/HOLMIUM LASER/STENT PLACEMENT;  Surgeon: Francisca Redell BROCKS, MD;  Location: [facility];  Service: Urology;  Laterality: Bilateral;   CYSTOSCOPY/URETEROSCOPY/HOLMIUM LASER/STENT PLACEMENT Right 08/25/2023   Procedure: CYSTOSCOPY/URETEROSCOPY/HOLMIUM LASER;  Surgeon: Francisca Redell BROCKS, MD;  Location: [facility];  Service: Urology;  Laterality: Right;   CYSTOSCOPY/URETEROSCOPY/HOLMIUM LASER/STENT PLACEMENT Right 12/15/2023   Procedure: CYSTOSCOPY/URETEROSCOPY/HOLMIUM LASER;  Surgeon: Francisca Redell BROCKS, MD;  Location: [facility];  Service: Urology;  Laterality: Right;   EXTRACORPOREAL SHOCK WAVE LITHOTRIPSY     x 10 plus   Eye Lift     FACIAL COSMETIC SURGERY     GANGLION CYST EXCISION Right 12/21/2021   Procedure: REMOVAL GANGLION OF WRIST;  Surgeon: Kathlynn Sharper, MD;  Location: [facility];  Service: Orthopedics;  Laterality: Right;   LIPOSUCTION     TONSILLECTOMY     TRIGGER FINGER RELEASE Right 12/21/2021   Procedure: RELEASE TRIGGER FINGER/A-1 PULLEY;  Surgeon: Kathlynn Sharper, MD;  Location: [facility];  Service: Orthopedics;  Laterality: Right;   URETEROSCOPY WITH HOLMIUM LASER LITHOTRIPSY     Patient Active Problem List   Diagnosis Date Noted   Acute CVA (cerebrovascular accident) (HCC) 12/19/2023   Hypokalemia 12/19/2023   AKI (acute kidney injury) (HCC) 12/19/2023   Leukocytosis  12/19/2023   Hyperlipidemia, unspecified 12/19/2023   Essential hypertension 12/19/2023   Vision changes 12/19/2023   Lymphedema 05/22/2023   Chronic venous insufficiency 05/22/2023   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 replacement therapy 04/10/2023   Insomnia due to medical condition 04/25/2022   GAD (generalized anxiety disorder) 02/02/2022   Other specified depressive episodes 02/02/2022   Long-term current use of benzodiazepine 02/02/2022   Migraine with aura and without status migrainosus, not  intractable 11/03/2020   Arrhythmia 08/06/2020   Status migrainosus 02/25/2019   Osteoporosis 04/09/2013   Vitamin D deficiency 04/09/2013   DDD (degenerative disc disease), lumbosacral 02/08/2005   ONSET DATE: July 2025 REFERRING DIAG: s/p CVA; chronic Left knee pain  THERAPY DIAG:  Muscle weakness (generalized)  Difficulty in walking, not elsewhere classified  Low vision, both eyes  Visual impairment  Cognitive communication deficit  Other lack of coordination  Rationale for Evaluation and Treatment: Rehabilitation  SUBJECTIVE:                                                                                                                                                                                             SUBJECTIVE STATEMENT:    Patient reports continuing to navigate her way with peripheral vision issues and blind spots.   Pt accompanied by: significant other  PERTINENT HISTORY:   63yoF who sustained a CVA in July 2025 with visual field loss and memory impairment, now followed by [facility] and [facility]. Pt has been working with OT here since July, OT noted some episodic imbalance and mild cognitive impairment hence recommended referral to OPPT and OPST. PCP sent referrals for these 3rd week of August. Pt also seen by [facility] sports medicine for chronic Left knee pain s/p remote menisectomy, MRI results pending, also sent a referral to PT for Left knee pain. Pt has memory difficulty since CVA which makes it difficult for her to specific particular deficits or difficulty with mobility since CVA, however OT has seen some episodic stumbling and LOB after bending forward to reach. Pt reports limited confidence in balance overall when walking out of house, will hold onto husband, is startled by new visual input in crowded spaces pertaining to visual deficits. 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   PAIN:  Are you having pain? No  PRECAUTIONS: None   WEIGHT BEARING RESTRICTIONS: No  FALLS: Has patient fallen in last 6 months? 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   PATIENT GOALS: Return to work, full confident mobility/activity.   OBJECTIVE:  Note: Objective measures were completed at Evaluation unless otherwise noted.  DIAGNOSTIC FINDINGS: Left knee MRI results pending.   COGNITION: Overall cognitive status: mild cognitive impairment s/p CVA in July, being seen for visual.cognitive remediation with OT and ST.    SENSATION: No reported deficits, not assessed.   EDEMA:  Mild focal edema to the anterior knee extensor mechanism, non tender, reported as chronic.   KNEE ROM:   0->130 degrees left knee flexion, no pain on inspection.   FUNCTIONAL MOBILITY ASSESSMENT:  1. Overground AMB, shod, level surface: no frank deviation or asymmetry, no LOB, gait speed appropriate to situation, age.   2. : 0.77m/s  3. retro: 0.74m/s (supervision level, no LOB)   4. 30sec chair rise: hands free, standardized height, 11x; no c/o of knee pain, mild asymmetry, mild falls anxiety without LOB  5. SLS balance: RLE: >30sec; LLE: >25sec (minGuardA)   6. Eyes closed balance on foam surface: 60sec, moderately increased sway without frank LOB   7. Curb step up/down (6): 3x each without LOB with modI 180 degree turns   8. Visual scanning and navigation about a congested area in [employer] at lunch time: adequate path navigation and obstacle avoidance.   9. Walking eyes closed x9ft: no LOB, straight line of progression, <25% change in gait speed   [facility] PT Assessment - 02/20/24 0001       Functional Gait  Assessment   Gait assessed  Yes    Gait Level Surface  Walks 20 ft in less than 5.5 sec, no assistive devices, good speed, no evidence for imbalance, normal gait pattern, deviates no more than 6 in outside of the 12 in walkway width.    Change in Gait Speed Able to smoothly change walking speed without loss of balance or gait deviation. Deviate no more than 6 in outside of the 12 in walkway width.    Gait with Horizontal Head Turns Performs head turns smoothly with no change in gait. Deviates no more than 6 in outside 12 in walkway width    Gait with Vertical Head Turns Performs head turns with no change in gait. Deviates no more than 6 in outside 12 in walkway width.    Gait and Pivot Turn Pivot turns safely within 3 sec and stops quickly with no loss of balance.    Step Over Obstacle Is able to step over 2 stacked shoe boxes taped together (9 in total height) without changing gait speed. No evidence of imbalance.    Gait with Narrow Base of Support Is able to ambulate for 10 steps heel to toe with no staggering.    Gait with Eyes Closed Walks 20 ft, no assistive devices, good speed, no evidence of imbalance, normal gait pattern, deviates no more than 6 in outside 12 in walkway width. Ambulates 20 ft in less than 7 sec.    Ambulating Backwards Walks 20 ft, no assistive devices, good speed, no evidence for imbalance, normal gait    Steps Alternating feet, no rail.    Total Score 30       [facility] PT Assessment - 02/21/24 0001       Mini-BESTest   Sit To Stand Normal: Comes to stand without use of hands and stabilizes independently.  Rise to Toes Normal: Stable for 3 s with maximum height.    Stand on one leg (left) Normal: 20 s.    Stand on one leg (right) Moderate: < 20 s    Stand on one leg - lowest score 1    Compensatory Stepping Correction - Forward Normal: Recovers independently with a single, large step (second realignement is allowed).    Compensatory Stepping Correction - Backward No step, OR would fall if not caught, OR falls  spontaneously.    Compensatory Stepping Correction - Left Lateral Moderate: Several steps to recover equilibrium    Compensatory Stepping Correction - Right Lateral Moderate: Several steps to recover equilibrium    Stepping Corredtion Lateral - lowest score 1    Stance - Feet together, eyes open, firm surface  Normal: 30s    Stance - Feet together, eyes closed, foam surface  Moderate: < 30s    Incline - Eyes Closed Moderate: Stands independently < 30s OR aligns with surface    Change in Gait Speed Moderate: Unable to change walking speed or signs of imbalance    Walk with head turns - Horizontal Normal: performs head turns with no change in gait speed and good balance    Walk with pivot turns Normal: Turns with feet close FAST (< 3 steps) with good balance.    Step over obstacles Moderate: Steps over box but touches box OR displays cautious behavior by slowing gait.    Timed UP & GO with Dual Task Severe: Stops counting while walking OR stops walking while counting.   9.91 sec 17.25sec   Mini-BEST total score 18        MCID (Godi,et al, 2013)  Clinically meaningful change is improvement of 4 points (out of 28 total)                                                                                                                             TREATMENT DATE 03/04/24:    NMR: To facilitate reeducation of movement, balance, posture, coordination, and/or proprioception/kinesthetic sense.   Ambulation with Horizontal head turns with visual scanning - calling out sticky notes while walking in hallway x 80 feet x 2 (1 trial with glasses on and one without)   Ambulation with vertical head motions - x 80 feet x 2   The patient completed a Balance Baseline Test using the [employer] platform to assess static and dynamic postural control. This standardized test measures the patient's ability to maintain balance under various conditions by recording weight shifts, sway, and stability. The results  provide objective data on balance deficits, asymmetries, and areas for targeted intervention. The patient tolerated the assessment well and required CGA assistance during the test. Findings will guide treatment planning and monitor progress over time. Testing performed on 6.0 stability level for several minutes.      The patient completed a Cognitive Baseline Assessment on the [facility] platform designed to evaluate cognitive functions such as  attention, processing speed, memory, and executive function in conjunction with balance and motor tasks. This integrated assessment provides objective data on the patient's cognitive-motor interaction and identifies areas needing targeted rehabilitation. The patient completed the assessment with CGA assistance and tolerated the testing well. Results will guide treatment planning and monitor cognitive and functional progress over time. Testing performed on 6.0 stability level for ?several? minutes .?    Dynamic fwd walking with head turning handing ball from left to right x 80 feet x 2.   Dynamic bwd walking with head turning handing ball from left to right x 50 feet.         Unless otherwise stated, CGA was provided and gait belt donned in order to ensure pt safety    PATIENT EDUCATION: Education details: findings for balance deficits through minibest and how it relates to functional balance at work.  encouraged to initiate walking program on treadmill.  Person educated: Patient, husband Education method: collaborative learning, deliberate practice, positive reinforcement, explicit instruction, establish rules. Education comprehension: verbalized understanding  HOME EXERCISE PROGRAM: Repeat exposure to public spaces up to 1 hours outings to practice spatial navigation, awareness, and visual reintegration. Visual scanning of traffic from passenger seat of car for visual reintegration.   GOALS: Goals reviewed with patient? No  SHORT TERM GOALS:  Target date: 02/25/24  Pt to report understanding of importance of progressive reintegration into community outings with progressive increase in length and complexity.  Baseline: Goal status: INITIAL  2.  Pt to reports ability to navigate simple, uncrowded public spaces at supervision level without use of SO for support and with use of railings prn.  Baseline:  Goal status: INITIAL  3.  Pt to report participation in regular long distance walking with SO 3x/week starting at 25 minutes per walk or greater.  Baseline:  Goal status: INITIAL  4. Pt will increase Minibest by > 4 points to indicate improved safety with community mobility and reduced fall risk.  Baseline: 18 Goal status: initial    ASSESSMENT:  CLINICAL IMPRESSION:  Patient presented with good motivation for all activities. She performed well with turning head and maintaining balance. She was encouraged to visually scan the area when walking to compensate for decreased peripheral vision. She will continue to benefit from higher level balance activities and visual biased activities. Pt will continue to benefit from skilled physical therapy intervention to address impairments, improve QOL, and attain therapy goals.     OBJECTIVE IMPAIRMENTS: decreased activity tolerance, decreased cognition, decreased endurance, difficulty walking, decreased safety awareness, and impaired perceived functional ability.   ACTIVITY LIMITATIONS: bending and stairs  PARTICIPATION LIMITATIONS: medication management, interpersonal relationship, driving, shopping, community activity, and occupation  PERSONAL FACTORS: Profession and Time since onset of injury/illness/exacerbation are also affecting patient's functional outcome.   REHAB POTENTIAL: Poor  CLINICAL DECISION MAKING: Evolving/moderate complexity  EVALUATION COMPLEXITY: High  PLAN:  PT FREQUENCY: 1-2x/week  PT DURATION: 4 weeks  PLANNED INTERVENTIONS: 97750- Physical Performance  Testing, 97110-Therapeutic exercises, 97530- Therapeutic activity, 97112- Neuromuscular re-education, 97535- Self Care, [id number]- Manual therapy, [phone]- Gait training, Patient/Family education, Balance training, Stair training, and Cognitive remediation  PLAN FOR NEXT SESSION:   visual navigation + scanning/ dual tasking.  High level balance training.   12:23 PM, 03/04/24 [facility]  Outpatient Physical Therapy- Main [address]   Reyes SAILOR [place], [place] 03/04/2024, 12:23 PM

## 2024-03-06 ENCOUNTER — Ambulatory Visit: Admitting: Occupational Therapy

## 2024-03-06 ENCOUNTER — Ambulatory Visit

## 2024-03-06 DIAGNOSIS — H547 Unspecified visual loss: Secondary | ICD-10-CM

## 2024-03-06 DIAGNOSIS — R41841 Cognitive communication deficit: Secondary | ICD-10-CM

## 2024-03-06 DIAGNOSIS — H543 Unqualified visual loss, both eyes: Secondary | ICD-10-CM

## 2024-03-06 DIAGNOSIS — M6281 Muscle weakness (generalized): Secondary | ICD-10-CM

## 2024-03-06 DIAGNOSIS — R278 Other lack of coordination: Secondary | ICD-10-CM

## 2024-03-06 DIAGNOSIS — R262 Difficulty in walking, not elsewhere classified: Secondary | ICD-10-CM

## 2024-03-06 NOTE — Therapy (Signed)
 Occupational Therapy Progress/Recertification Note  Dates of reporting period  01/29/24   to   03/06/24    Patient Name: Ashley Collins MRN: [id number] DOB:March 03, 1961, 63 y.o., female Today's Date: 03/06/2024  PCP: Harvey Gaetana CROME, NP REFERRING PROVIDER: Harvey Gaetana CROME, NP   OT End of Session - 03/06/24 1334     Visit Number 20    Number of Visits 24    Date for OT Re-Evaluation 05/29/24    OT Start Time 1315    OT Stop Time 1400    OT Time Calculation (min) 45 min    Activity Tolerance Patient tolerated treatment well    Behavior During Therapy WFL for tasks assessed/performed             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   Cardiac arrhythmia    Nonspecific ST T wave changes on EKG   Chronic venous insufficiency of lower extremity    Complication of anesthesia    nausea and vomiting   DDD (degenerative disc disease), lumbosacral    Essential hypertension    Headache    History of kidney stones    Hyperlipidemia    Kidney stones    Lymphedema    Migraines    Osteoporosis    PONV (postoperative nausea and vomiting)    Right ureteral stone    Vitamin B12 deficiency    Vitamin D deficiency    Past Surgical History:  Procedure Laterality Date   AUGMENTATION MAMMAPLASTY     CESAREAN SECTION     x 4   COLONOSCOPY WITH PROPOFOL  N/A 03/31/2022   Procedure: COLONOSCOPY WITH PROPOFOL ;  Surgeon: Onita Elspeth Sharper, DO;  Location: [facility] ENDOSCOPY;  Service: Gastroenterology;  Laterality: N/A;   CYSTOSCOPY W/ RETROGRADES Bilateral 07/10/2020   Procedure: CYSTOSCOPY WITH RETROGRADE PYELOGRAM;  Surgeon: Francisca Redell BROCKS, MD;  Location: [facility] ORS;  Service: Urology;  Laterality: Bilateral;   CYSTOSCOPY/URETEROSCOPY/HOLMIUM LASER/STENT PLACEMENT     CYSTOSCOPY/URETEROSCOPY/HOLMIUM LASER/STENT PLACEMENT Bilateral 07/10/2020   Procedure: CYSTOSCOPY/URETEROSCOPY/HOLMIUM LASER/STENT PLACEMENT;   Surgeon: Francisca Redell BROCKS, MD;  Location: [facility] ORS;  Service: Urology;  Laterality: Bilateral;   CYSTOSCOPY/URETEROSCOPY/HOLMIUM LASER/STENT PLACEMENT Right 08/25/2023   Procedure: CYSTOSCOPY/URETEROSCOPY/HOLMIUM LASER;  Surgeon: Francisca Redell BROCKS, MD;  Location: [facility] ORS;  Service: Urology;  Laterality: Right;   CYSTOSCOPY/URETEROSCOPY/HOLMIUM LASER/STENT PLACEMENT Right 12/15/2023   Procedure: CYSTOSCOPY/URETEROSCOPY/HOLMIUM LASER;  Surgeon: Francisca Redell BROCKS, MD;  Location: [facility] ORS;  Service: Urology;  Laterality: Right;   EXTRACORPOREAL SHOCK WAVE LITHOTRIPSY     x 10 plus   Eye Lift     FACIAL COSMETIC SURGERY     GANGLION CYST EXCISION Right 12/21/2021   Procedure: REMOVAL GANGLION OF WRIST;  Surgeon: Kathlynn Sharper, MD;  Location: [facility] ORS;  Service: Orthopedics;  Laterality: Right;   LIPOSUCTION     TONSILLECTOMY     TRIGGER FINGER RELEASE Right 12/21/2021   Procedure: RELEASE TRIGGER FINGER/A-1 PULLEY;  Surgeon: Kathlynn Sharper, MD;  Location: [facility] ORS;  Service: Orthopedics;  Laterality: Right;   URETEROSCOPY WITH HOLMIUM LASER LITHOTRIPSY     Patient Active Problem List   Diagnosis Date Noted   Acute CVA (cerebrovascular accident) (HCC) 12/19/2023   Hypokalemia 12/19/2023   AKI (acute kidney injury) (HCC) 12/19/2023   Leukocytosis 12/19/2023   Hyperlipidemia, unspecified 12/19/2023   Essential hypertension 12/19/2023   Vision changes 12/19/2023   Lymphedema 05/22/2023   Chronic venous insufficiency  05/22/2023   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 replacement therapy 04/10/2023   Insomnia due to medical condition 04/25/2022   GAD (generalized anxiety disorder) 02/02/2022   Other specified depressive episodes 02/02/2022   Long-term current use of benzodiazepine 02/02/2022   Migraine with aura and without status migrainosus, not intractable 11/03/2020   Arrhythmia 08/06/2020   Status migrainosus 02/25/2019   Osteoporosis 04/09/2013   Vitamin D deficiency 04/09/2013   DDD  (degenerative disc disease), lumbosacral 02/08/2005   ONSET DATE: 12/18/2023  REFERRING DIAG:   THERAPY DIAG:  Muscle weakness (generalized)  Other lack of coordination  Rationale for Evaluation and Treatment: Rehabilitation  SUBJECTIVE:  SUBJECTIVE STATEMENT: Pt. reports that she has a follow-up MD Eye appointment next month. Pt accompanied by: significant other  PERTINENT HISTORY: Pt. has Hx of stroke with onset 12/18/2023. Pt. PMHx includes: HTN, Hyperlipidemia, Kidney Stones, near syncopal event, loss of vision.  PRECAUTIONS: None  WEIGHT BEARING RESTRICTIONS:  PAIN:  Are you having pain? No  FALLS: Has patient fallen in last 6 months? Yes. Number of falls    LIVING ENVIRONMENT: Lives with: lives with their family Lives in: [place], [place] Stairs: No, inside the house, yes but does not use Has following equipment at home:   PLOF: Independent  PATIENT GOALS: To be able to see  OBJECTIVE:  Note: Objective measures were completed at Evaluation unless otherwise noted.  HAND DOMINANCE: Right  ADLs:  Eating: Drinking from straw is different, able to use utensils.  Grooming: Fatigues completing hair care.  UB Dressing: independent, if clothes are in front of her she can find clothing LB Dressing: Independent Toileting: Independent Bathing: Independent Tub Shower transfers: Independent Equipment: none Has difficulty with using cell phone  IADLs: Shopping: Does not typically go out shopping, and has not tried to. Light housekeeping: Pt. reports that she does not typically do house cleaning. Pt. has been able to unpack belongings from boxes due to her recent move in. Meal Prep: Pt. reports that is does not currently cook, however reports that she probably could. Has difficulty opening bottle caps/lids. Community mobility: Requires assistance from husband to navigate  through buildings, negotiate stairs-Pt. With increased fear of falling  Medication management: Able  to push down medication bottles to open  Financial management: No change in the process-uses automatic bill pay system.  Has difficulty with using cell phone Handwriting: N/T Work: Pt. was actively working managing a Health visitor, works a lot of hours  MOBILITY STATUS: Independent and Needs Assist: Requires hand on hand assistance with nvigating through environments.   POSTURE COMMENTS:  No Significant postural limitations Sitting balance: Good  ACTIVITY TOLERANCE: Activity tolerance: Good  FUNCTIONAL OUTCOME MEASURES:   UPPER EXTREMITY ROM:    Active ROM Right Eval WFL Left Eval [facility]  Shoulder flexion    Shoulder abduction    Shoulder adduction    Shoulder extension    Shoulder internal rotation    Shoulder external rotation    Elbow flexion    Elbow extension    Wrist flexion    Wrist extension    Wrist ulnar deviation    Wrist radial deviation    Wrist pronation    Wrist supination    (Blank rows = not tested)  UPPER EXTREMITY MMT:     MMT Right eval Right Left eval Left  Shoulder flexion 4-/5 5/5 4-/5 5/5  Shoulder abduction 4-/5 5/5 4-/5 5/5  Shoulder adduction      Shoulder extension  Shoulder internal rotation      Shoulder external rotation      Middle trapezius      Lower trapezius      Elbow flexion 5/5 5/5 5/5 5/5  Elbow extension 5/5 5/5 5/5 5/5  Wrist flexion 4/5 5/5 4-/5 5/5  Wrist extension 4/5 5/5 4-/5 5/5  Wrist ulnar deviation      Wrist radial deviation      Wrist pronation      Wrist supination      (Blank rows = not tested)  HAND FUNCTION: Grip strength: Right: 39 lbs; Left: 28 lbs, Lateral pinch: Right: 11 lbs, Left: 9 lbs, and 3 point pinch: Right: 9 lbs, Left: 9 lbs  01/29/24 Grip strength: Right: 39 lbs; Left: 36 lbs, Lateral pinch: Right: 14 lbs, Left: 12 lbs, and 3 point pinch: Right: 12 lbs, Left: 11 lbs   COORDINATION: 9 Hole Peg test: Right: 30 sec; Left: 36 sec  01/29/24 9 Hole Peg test: Right: 24 sec;  Left: 23 sec   SENSATION: WFL  EDEMA:   MUSCLE TONE:   COGNITION: Overall cognitive status: Within functional limits for tasks assessed  VISION: Subjective report: Pt. reports changes in her vision at onset of stroke, starting with blurry vision, resulting in vision loss in the R eye.  Baseline vision: Pt. Is able to visually track  Visual history: Hx of eye surgery  VISION ASSESSMENT: TBD  PERCEPTION: WFL  PRAXIS: WFL  OBSERVATIONS:                                                                                                                    TREATMENT DATE: 03/06/24   Therapeutic Activities:   Measurements were obtained, and goals were reviewed with the Pt.  Therapeutic Activities:   -Facilitated visual scanning tasks at the tabletop  with emphasis focused on visual scanning using a letter cross out tas with the tabletop efficiently with 100% accuracy.   -Facilitated visual scanning tasks using a detailed state map.  Pt. Required increased time to locate various items on the map.  HOME EXERCISE PROGRAM:  -Writing tasks -visual motor tasks.SABRA    GOALS: Goals reviewed with patient? Yes  SHORT TERM GOALS: Target date: 04/17/2024    Pt. Will independently utilize HEP for hand strength, coordination, and visual compensatory strategies ADL/ADLs Baseline: 03/06/24: Independent Eval: No  current HEP  Goal status: Ongoing   LONG TERM GOALS: Target date: 05/29/2024   Pt. Will be able to independently implement visual scanning/visual search compensatory strategies for tasks within her extra personal space navigating through community environments 100% of the time.  Baseline: 01/29/24:  Independent  100% of the time within the therapy gym, and hallway. Eval: Pt. Requires increased assist to navigate through community environments. Goal status: Achieved  2.  Pt. Will be able to independently utilize visual scanning/visual search strategies  during ADLS/IADL within her  near space, and during tabletop top tasks. 100% of the time.  Baseline: 03/06/24: Initiates visual scanning 85%  of the time  01/29/24: 75% Eval: Pt. Education to be provided.  Goal status: INITIAL  3.  Pt. Will be able to independently initiate visual scanning techniques in her own environment to reduce risk of falls.  Baseline: 01/29/24: Education was provided, Pt. Is utlizing visual compensatory strategies/visual scanning with in her home. Eval: Pt. Education to be provided.  Goal status: Achieved  4.  Pt. Will increase BUE Grip Strength by 5# to be able to independently hold objects for ADL/IADL use. Baseline: 01/29/24: Grip strength: Right: 39 lbs; Left: 28 lbs, Eval: Right Grip: 39#, Left Grip: 28# Goal status: Achieved  5.  Pt. Will increase L Lateral Pinch strength by 2# to be able to independently twist off bottle caps/lids. Baseline: 01/29/24: Lateral pinch: Right: 14 lbs, Left: 12 lbs Eval: Lateral pinch: Right: 11 lbs, Left: 9 lbs  Goal status: Achieved  6.  Pt. Will increase BUE strength for shoulder flexion/abduction by 2 mm grades to independently complete hair care tasks. Baseline: 01/29/24: 5/5 overall Eval: Right Shoulder Flexion: 4-/5, Left Shoulder Flexion:4-/5, Right Shoulder Abduction: 4-/5, Left Shoulder Abduction: 4-/5 Goal status: Achieved  7.  Pt will be indep with medication set up using weekly pill organizer.  Baseline: 01/29/24: Independent 01/15/24: Daughter currently manages medication set up.  Goal status: Achieved  8. Pt will increase typing speed to 20-30 words per minute with at least 90% accuracy to work towards more efficient typing for job related  responsibilities.  Baseline: 03/06/24: 10 wpm with 92% accuracy. 01/29/24: Continue 01/15/24: 8wpm x88% accuracy=7 wpm  Goal status: Progressing/Ongoing  9. Pt will be able to scan small print on store receipts to check for errors with 100% accuracy using visual compensation strategies as needed.  Baseline: 03/06/24:  Pt. Continues to present with difficulty with visual scanning small  small items efficiently. 01/29/24: Pt. Continues to have difficulty scanning small print items. 01/15/24: Not yet attempted; required for return to work/job responsibility  Goal status:  Ongoing  10. Pt. Will independently identify 8 items consistently from visual visual memory in preparation or ADLs.    Baseline: 03/06/24: Pt. Is able to identify up to 6 picture items while seated at the tabletop. Pt. is able to consistently Identify 4 letters from visual memory when attention is divided.  01/29/24: Pt. is able to identify 6 items consistently from visual memory.    Gaol status: Ongoing      11. Pt. will be independently write 4 sentences  efficiently with 100% legibility, no deviation from writing on a blank line, and appropriate spacing between letters.              Baseline: 03/06/24: 4 lines with 75% legibility, positive deviation below the line, and excessive spacing between the words.              Goal status: Ongoing  ASSESSMENT: CLINICAL IMPRESSION:   Pt. continues to make progress overall, and is progressing in visual scanning tasks. Pt. continues to work towards working improving compensatory strategies for accuracy with identifying the details during visual spatial awareness tasks. Pt. continues to work on improving strategies for visual memory during ADL/IADL tasks. Pt. is improving with typing, and writing skills, however continues to work on typing speed, typing accuracy, writing legibility, and spacing during writing needed for home, and work related task. Pt. continues to benefit from Occupational Therapy services to improve her ability to use visual compensatory strategies, and improve overall BUE functioning in order to improve engagement in, and  maximize overall independence with ADL, and IADL tasks.  PERFORMANCE DEFICITS: in functional skills including ADLs, IADLs, coordination, dexterity, Fine motor control, and  vision, and psychosocial skills including coping strategies, environmental adaptation, habits, interpersonal interactions, and routines and behaviors.   IMPAIRMENTS: are limiting patient from ADLs, IADLs, rest and sleep, work, leisure, and social participation.   CO-MORBIDITIES: may have co-morbidities  that affects occupational performance. Patient will benefit from skilled OT to address above impairments and improve overall function.  MODIFICATION OR ASSISTANCE TO COMPLETE EVALUATION: Min-Moderate modification of tasks or assist with assess necessary to complete an evaluation.  OT OCCUPATIONAL PROFILE AND HISTORY: Detailed assessment: Review of records and additional review of physical, cognitive, psychosocial history related to current functional performance.  CLINICAL DECISION MAKING: Moderate - several treatment options, min-mod task modification necessary  REHAB POTENTIAL: Good  EVALUATION COMPLEXITY: Moderate    PLAN:  OT FREQUENCY: 2x/week  OT DURATION: 12 weeks  PLANNED INTERVENTIONS: 97168 OT Re-evaluation, 97535 self care/ADL training, 02889 therapeutic exercise, 97530 therapeutic activity, 97112 neuromuscular re-education, visual/perceptual remediation/compensation, patient/family education, and DME and/or AE instructions  RECOMMENDED OTHER SERVICES: ST  CONSULTED AND AGREED WITH PLAN OF CARE: Patient and family member/caregiver  PLAN FOR NEXT SESSION: Treatment  Richardson Otter, MS, OTR/L   03/06/24, 1:36 PM

## 2024-03-06 NOTE — Therapy (Signed)
 OUTPATIENT SPEECH LANGUAGE PATHOLOGY  TREATMENT   Patient Name: Ashley Collins MRN: [id number] DOB:[birth year], 63 y.o., female Today's Date: 03/06/2024  PCP: Gaetana Haddock, NP  REFERRING PROVIDER: same   End of Session - 03/06/24 1437     Visit Number 4    Number of Visits 24    Date for SLP Re-Evaluation 05/14/24    SLP Start Time 1445    SLP Stop Time  1530    SLP Time Calculation (min) 45 min    Activity Tolerance Patient tolerated treatment well           Patient Active Problem List   Diagnosis Date Noted   Acute CVA (cerebrovascular accident) (HCC) 12/19/2023   Hypokalemia 12/19/2023   AKI (acute kidney injury) (HCC) 12/19/2023   Leukocytosis 12/19/2023   Hyperlipidemia, unspecified 12/19/2023   Essential hypertension 12/19/2023   Vision changes 12/19/2023   Lymphedema 05/22/2023   Chronic venous insufficiency 05/22/2023   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 replacement therapy 04/10/2023   Insomnia due to medical condition 04/25/2022   GAD (generalized anxiety disorder) 02/02/2022   Other specified depressive episodes 02/02/2022   Long-term current use of benzodiazepine 02/02/2022   Migraine with aura and without status migrainosus, not intractable 11/03/2020   Arrhythmia 08/06/2020   Status migrainosus 02/25/2019   Osteoporosis 04/09/2013   Vitamin D deficiency 04/09/2013   DDD (degenerative disc disease), lumbosacral 02/08/2005    ONSET DATE: 12/19/23   REFERRING DIAG: CVA- memory deficits  THERAPY DIAG:  Cognitive communication deficit  Rationale for Evaluation and Treatment Rehabilitation  SUBJECTIVE:   SUBJECTIVE STATEMENT: Pt alert, pleasant, and cooperative. Pt accompanied by: self and significant other  PERTINENT HISTORY & DIAGNOSTIC FINDINGS: Pt is 63 y.o. female who presents today for a cognitive-communication evaluation in setting of stroke. MRI 12/18/23 1. 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 No  associated hemorrhage or mass effect. PMHx as outlined above.  PAIN:  Are you having pain? No   FALLS: Has patient fallen in last 6 months?  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   PATIENT GOALS  to return to PLOF   OBJECTIVE:  Reviewed concept of use of journaling to improve short term and prospective memory. Pt has been journaling to improve short term and prospective memory. Reviewed attention strategies such as telling self ot mentally focus and to narrate actions aloud (e.g. when cooking, placing items down, or while going to get something from another room). Pt endorsed utilizing at home with success. Reviewed modified Pomodoro method with pt taking regularly schedule rest breaks. Pt to schedule day utilizing calendar as if she was at work with consideration for priority tasks and building in rest breaks. Reviewed types of memory and external memory strategies. Pt has been using calendar on iPhone with some difficulty navigating and added sufficient detail. Introduced internal memory strategies and pt utilized with min during picture retention task (immediately and with 45 minute delay). Pt endorsed difficulty managing time and provided with a time study worksheet for HEP.  PATIENT EDUCATION: Education details: as above Person educated: Patient and Spouse Education method: Explanation; Handout Education comprehension: verbalized understanding  HOME EXERCISE PROGRAM:        Practice narrating actions  Telling self to mentally focus  Continue calendar use, blocking schedule, and  journaling  Time study worksheet    GOALS:  Goals reviewed with patient? Yes  SHORT TERM GOALS: Target date: 10 sessions  Pt will complete PROM re: memory.  Baseline: Goal  status: MET   2.  Pt will endorse successful implementation of at least x2 compensations for attention and memory.  Baseline:  Goal status: INITIAL  3.  With Moderate A, patient will establish external aid for memory/executive function and bring to more than 50% of therapy sessions.    Baseline:  Goal status: INITIAL    LONG TERM GOALS: Target date: 12 weeks  Pt will endorse improvement in cognitive-communication per PROM.  Baseline:  Goal status: INITIAL  2.  Pt and/or husband will demonstrate understanding of ways to promote and support cognitive-communication outside of SLP sessions.  Baseline:  Goal status: INITIAL   ASSESSMENT:  CLINICAL IMPRESSION:  Pt is 63 y.o. female who presents today for a cognitive-communication treatment in setting of stroke. Initial assessment completed via formal means (Cognitive-Linguistic Quick Test) and PROM (Neuro-QoL Adult Cognitive Function v2.0). Pt presents with at least mild cognitive-communication deficits affecting attention and visuospatial skills per CLQT as well as memory and executive functioning per PROM. Suspect CLQT is not as sensitive to higher level cognitive-communication deficits appreciated by pt during iADLs. See details of today's tx as outlined above. Recommend skilled ST services targeting above mentioned deficits to improve QoL and performance on ADLs/IADLs.  OBJECTIVE IMPAIRMENTS include attention, memory, executive functioning, and visuospatial deficits. These impairments are limiting patient from return to work, managing appointments, household responsibilities, and ADLs/IADLs. Factors affecting potential to achieve goals and functional outcome are medical prognosis.. Patient will benefit from skilled SLP services to address above impairments and improve overall function.  REHAB POTENTIAL: Good  PLAN: SLP FREQUENCY: 2x/week  SLP DURATION: 12 weeks  PLANNED INTERVENTIONS: Cueing hierachy, Cognitive reorganization,  Internal/external aids, Functional tasks, SLP instruction and feedback, Compensatory strategies, and Patient/family education    Delon Bangs, M.S., CCC-SLP Speech-Language Pathologist [facility] [phone] [place])  [facility] at [facility] [address] [place], [place], [address] Phone: [phone]   Fax:  [phone]

## 2024-03-06 NOTE — Therapy (Signed)
 OUTPATIENT PHYSICAL THERAPY TREATMENT  Patient Name: Ashley Collins MRN: [id number] DOB:26-Dec-1960, 63 y.o., female Today's Date: 03/06/2024  PCP: Harvey Gaetana CROME, NP REFERRING PROVIDER: Harvey Gaetana CROME, NP, Prentice Reges, DO   END OF SESSION:  PT End of Session - 03/06/24 1401     Visit Number 5    Number of Visits 6    Date for PT Re-Evaluation 03/19/24    Authorization Type [employer] HMO/PPO    Authorization Time Period 02/20/24-03/19/24    Progress Note Due on Visit 10    PT Start Time 1402    PT Stop Time 1445    PT Time Calculation (min) 43 min    Equipment Utilized During Treatment Gait belt    Activity Tolerance Patient tolerated treatment well;No increased pain    Behavior During Therapy WFL for tasks assessed/performed          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   Cardiac arrhythmia    Nonspecific ST T wave changes on EKG   Chronic venous insufficiency of lower extremity    Complication of anesthesia    nausea and vomiting   DDD (degenerative disc disease), lumbosacral    Essential hypertension    Headache    History of kidney stones    Hyperlipidemia    Kidney stones    Lymphedema    Migraines    Osteoporosis    PONV (postoperative nausea and vomiting)    Right ureteral stone    Vitamin B12 deficiency    Vitamin D deficiency    Past Surgical History:  Procedure Laterality Date   AUGMENTATION MAMMAPLASTY     CESAREAN SECTION     x 4   COLONOSCOPY WITH PROPOFOL  N/A 03/31/2022   Procedure: COLONOSCOPY WITH PROPOFOL ;  Surgeon: Onita Elspeth Sharper, DO;  Location: [facility] ENDOSCOPY;  Service: Gastroenterology;  Laterality: N/A;   CYSTOSCOPY W/ RETROGRADES Bilateral 07/10/2020   Procedure: CYSTOSCOPY WITH RETROGRADE PYELOGRAM;  Surgeon: Francisca Redell BROCKS, MD;  Location: [facility] ORS;  Service: Urology;  Laterality: Bilateral;   CYSTOSCOPY/URETEROSCOPY/HOLMIUM LASER/STENT PLACEMENT      CYSTOSCOPY/URETEROSCOPY/HOLMIUM LASER/STENT PLACEMENT Bilateral 07/10/2020   Procedure: CYSTOSCOPY/URETEROSCOPY/HOLMIUM LASER/STENT PLACEMENT;  Surgeon: Francisca Redell BROCKS, MD;  Location: [facility] ORS;  Service: Urology;  Laterality: Bilateral;   CYSTOSCOPY/URETEROSCOPY/HOLMIUM LASER/STENT PLACEMENT Right 08/25/2023   Procedure: CYSTOSCOPY/URETEROSCOPY/HOLMIUM LASER;  Surgeon: Francisca Redell BROCKS, MD;  Location: [facility] ORS;  Service: Urology;  Laterality: Right;   CYSTOSCOPY/URETEROSCOPY/HOLMIUM LASER/STENT PLACEMENT Right 12/15/2023   Procedure: CYSTOSCOPY/URETEROSCOPY/HOLMIUM LASER;  Surgeon: Francisca Redell BROCKS, MD;  Location: [facility] ORS;  Service: Urology;  Laterality: Right;   EXTRACORPOREAL SHOCK WAVE LITHOTRIPSY     x 10 plus   Eye Lift     FACIAL COSMETIC SURGERY     GANGLION CYST EXCISION Right 12/21/2021   Procedure: REMOVAL GANGLION OF WRIST;  Surgeon: Kathlynn Sharper, MD;  Location: [facility] ORS;  Service: Orthopedics;  Laterality: Right;   LIPOSUCTION     TONSILLECTOMY     TRIGGER FINGER RELEASE Right 12/21/2021   Procedure: RELEASE TRIGGER FINGER/A-1 PULLEY;  Surgeon: Kathlynn Sharper, MD;  Location: [facility] ORS;  Service: Orthopedics;  Laterality: Right;   URETEROSCOPY WITH HOLMIUM LASER LITHOTRIPSY     Patient Active Problem List   Diagnosis Date Noted   Acute CVA (cerebrovascular accident) (HCC) 12/19/2023   Hypokalemia 12/19/2023   AKI (acute kidney injury) (HCC) 12/19/2023   Leukocytosis 12/19/2023  Hyperlipidemia, unspecified 12/19/2023   Essential hypertension 12/19/2023   Vision changes 12/19/2023   Lymphedema 05/22/2023   Chronic venous insufficiency 05/22/2023   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 replacement therapy 04/10/2023   Insomnia due to medical condition 04/25/2022   GAD (generalized anxiety disorder) 02/02/2022   Other specified depressive episodes 02/02/2022   Long-term current use of benzodiazepine 02/02/2022   Migraine with aura and without status migrainosus, not intractable  11/03/2020   Arrhythmia 08/06/2020   Status migrainosus 02/25/2019   Osteoporosis 04/09/2013   Vitamin D deficiency 04/09/2013   DDD (degenerative disc disease), lumbosacral 02/08/2005   ONSET DATE: July 2025 REFERRING DIAG: s/p CVA; chronic Left knee pain  THERAPY DIAG:  Muscle weakness (generalized)  Other lack of coordination  Cognitive communication deficit  Difficulty in walking, not elsewhere classified  Low vision, both eyes  Visual impairment  Rationale for Evaluation and Treatment: Rehabilitation  SUBJECTIVE:                                                                                                                                                                                             SUBJECTIVE STATEMENT:   Pt reports she is feeling ok.  Pt denies any pain, but reports she had an injection in the low back yesterday for the back pain and its feeling much better.  Pt reports she may be getting an epidural if this does not assist in resolving the low back pain.  Pt accompanied by: significant other  PERTINENT HISTORY:   63yoF who sustained a CVA in July 2025 with visual field loss and memory impairment, now followed by [facility] and [facility]. Pt has been working with OT here since July, OT noted some episodic imbalance and mild cognitive impairment hence recommended referral to OPPT and OPST. PCP sent referrals for these 3rd week of August. Pt also seen by [facility] sports medicine for chronic Left knee pain s/p remote menisectomy, MRI results pending, also sent a referral to PT for Left knee pain. Pt has memory difficulty since CVA which makes it difficult for her to specific particular deficits or difficulty with mobility since CVA, however OT has seen some episodic stumbling and LOB after bending forward to reach. Pt reports limited confidence in balance overall when walking out of house, will hold onto husband, is startled by new visual input in crowded  spaces pertaining to visual deficits. 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   PAIN:  Are you having pain? No  PRECAUTIONS: None   WEIGHT BEARING RESTRICTIONS: No  FALLS: Has patient fallen in last 6 months? 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   PATIENT GOALS: Return to work, full confident mobility/activity.   OBJECTIVE:  Note: Objective measures were completed at Evaluation unless otherwise noted.  DIAGNOSTIC FINDINGS: Left knee MRI results pending.   COGNITION: Overall cognitive status: mild cognitive impairment s/p CVA in July, being seen for visual.cognitive remediation with OT and ST.    SENSATION: No reported deficits, not assessed.   EDEMA:  Mild focal edema to the anterior knee extensor mechanism, non tender, reported as chronic.   KNEE ROM:   0->130 degrees left knee flexion, no pain on inspection.   FUNCTIONAL MOBILITY ASSESSMENT:  1. Overground AMB, shod, level surface: no frank deviation or asymmetry, no LOB, gait speed appropriate to situation, age.   2. : 0.1m/s  3. retro: 0.14m/s (supervision level, no LOB)   4. 30sec chair rise: hands free, standardized height, 11x; no c/o of knee pain, mild asymmetry, mild falls anxiety without LOB  5. SLS balance: RLE: >30sec; LLE: >25sec (minGuardA)   6. Eyes closed balance on foam surface: 60sec, moderately increased sway without frank LOB   7. Curb step up/down (6): 3x each without LOB with modI 180 degree turns   8. Visual scanning and navigation about a congested area in [employer] at lunch time: adequate path navigation and obstacle avoidance.   9. Walking eyes closed x48ft: no LOB, straight line of  progression, <25% change in gait speed   [facility] PT Assessment - 02/20/24 0001       Functional Gait  Assessment   Gait assessed  Yes    Gait Level Surface Walks 20 ft in less than 5.5 sec, no assistive devices, good speed, no evidence for imbalance, normal gait pattern, deviates no more than 6 in outside of the 12 in walkway width.    Change in Gait Speed Able to smoothly change walking speed without loss of balance or gait deviation. Deviate no more than 6 in outside of the 12 in walkway width.    Gait with Horizontal Head Turns Performs head turns smoothly with no change in gait. Deviates no more than 6 in outside 12 in walkway width    Gait with Vertical Head Turns Performs head turns with no change in gait. Deviates no more than 6 in outside 12 in walkway width.    Gait and Pivot Turn Pivot turns safely within 3 sec and stops quickly with no loss of balance.    Step Over Obstacle Is able to step over 2 stacked shoe boxes taped together (9 in total height) without changing gait speed. No evidence of imbalance.    Gait with Narrow Base of Support Is able to ambulate for 10 steps heel to toe with no staggering.    Gait with Eyes Closed Walks 20 ft, no assistive devices, good speed, no evidence of imbalance, normal gait pattern, deviates no more than 6 in outside 12 in walkway width. Ambulates 20 ft in less than 7 sec.    Ambulating Backwards Walks 20 ft, no assistive devices, good speed, no evidence for imbalance, normal gait    Steps Alternating feet, no rail.    Total Score 30  [facility] PT Assessment - 02/21/24 0001       Mini-BESTest   Sit To Stand Normal: Comes to stand without use of hands and stabilizes independently.    Rise to Toes Normal: Stable for 3 s with maximum height.    Stand on one leg (left) Normal: 20 s.    Stand on one leg (right) Moderate: < 20 s    Stand on one leg - lowest score 1    Compensatory Stepping Correction - Forward Normal: Recovers independently with a  single, large step (second realignement is allowed).    Compensatory Stepping Correction - Backward No step, OR would fall if not caught, OR falls spontaneously.    Compensatory Stepping Correction - Left Lateral Moderate: Several steps to recover equilibrium    Compensatory Stepping Correction - Right Lateral Moderate: Several steps to recover equilibrium    Stepping Corredtion Lateral - lowest score 1    Stance - Feet together, eyes open, firm surface  Normal: 30s    Stance - Feet together, eyes closed, foam surface  Moderate: < 30s    Incline - Eyes Closed Moderate: Stands independently < 30s OR aligns with surface    Change in Gait Speed Moderate: Unable to change walking speed or signs of imbalance    Walk with head turns - Horizontal Normal: performs head turns with no change in gait speed and good balance    Walk with pivot turns Normal: Turns with feet close FAST (< 3 steps) with good balance.    Step over obstacles Moderate: Steps over box but touches box OR displays cautious behavior by slowing gait.    Timed UP & GO with Dual Task Severe: Stops counting while walking OR stops walking while counting.   9.91 sec 17.25sec   Mini-BEST total score 18        MCID (Godi,et al, 2013)  Clinically meaningful change is improvement of 4 points (out of 28 total)                                                                                                                             TREATMENT DATE 03/06/24:     NMR: To facilitate reeducation of movement, balance, posture, coordination, and/or proprioception/kinesthetic sense.  Ambulation with horizontal visual scanning with eyes only, maintaining forward facing head posture - calling out sticky notes while walking in hallway x 80 feet x 2  Ambulation with vertical head motions while gaze fixated on door sign - x 80 feet x 2  VOR x1 with horizontal head turns, 30 sec bouts x2  VOR x1 with vertical head nods, 30 sec bouts x2  Standing  horizontal saccade exercise looking from one object on wall on the left quickly to one on the right    TherAct: To improve functional movements patterns for everyday tasks  Ambulation to the cancer center to improve overall endurance as pt reports she fatigues quickly with longer walking and feels  the need to practice with people walking by as it startles her due to the lack of peripheral vision    Unless otherwise stated, CGA was provided and gait belt donned in order to ensure pt safety    PATIENT EDUCATION: Education details: findings for balance deficits through minibest and how it relates to functional balance at work.  encouraged to initiate walking program on treadmill.  Person educated: Patient, husband Education method: collaborative learning, deliberate practice, positive reinforcement, explicit instruction, establish rules. Education comprehension: verbalized understanding  HOME EXERCISE PROGRAM: Repeat exposure to public spaces up to 1 hours outings to practice spatial navigation, awareness, and visual reintegration. Visual scanning of traffic from passenger seat of car for visual reintegration.   GOALS: Goals reviewed with patient? No  SHORT TERM GOALS: Target date: 02/25/24  Pt to report understanding of importance of progressive reintegration into community outings with progressive increase in length and complexity.  Baseline: Goal status: INITIAL  2.  Pt to reports ability to navigate simple, uncrowded public spaces at supervision level without use of SO for support and with use of railings prn.  Baseline:  Goal status: INITIAL  3.  Pt to report participation in regular long distance walking with SO 3x/week starting at 25 minutes per walk or greater.  Baseline:  Goal status: INITIAL  4. Pt will increase Minibest by > 4 points to indicate improved safety with community mobility and reduced fall risk.  Baseline: 18 Goal status: initial     ASSESSMENT:  CLINICAL IMPRESSION:  Pt responded well to the visual tracking exercises and notes some fuzziness with the peripheral vision at times when calling out post it notes in the hallway.  Pt also reporting fatigue when ambulating longer distances (within a store) due to people walking past, so pt was taken to the cancer center with people walking past her along the way.  Pt continues to perform well, however still needs assistance with higher level visual activities.  Pt would continue to benefit from [place] activities.   Pt will continue to benefit from skilled therapy to address remaining deficits in order to improve overall QoL and return to PLOF.       OBJECTIVE IMPAIRMENTS: decreased activity tolerance, decreased cognition, decreased endurance, difficulty walking, decreased safety awareness, and impaired perceived functional ability.   ACTIVITY LIMITATIONS: bending and stairs  PARTICIPATION LIMITATIONS: medication management, interpersonal relationship, driving, shopping, community activity, and occupation  PERSONAL FACTORS: Profession and Time since onset of injury/illness/exacerbation are also affecting patient's functional outcome.   REHAB POTENTIAL: Poor  CLINICAL DECISION MAKING: Evolving/moderate complexity  EVALUATION COMPLEXITY: High  PLAN:  PT FREQUENCY: 1-2x/week  PT DURATION: 4 weeks  PLANNED INTERVENTIONS: 97750- Physical Performance Testing, 97110-Therapeutic exercises, 97530- Therapeutic activity, 97112- Neuromuscular re-education, 97535- Self Care, [id number]- Manual therapy, [phone]- Gait training, Patient/Family education, Balance training, Stair training, and Cognitive remediation  PLAN FOR NEXT SESSION:   Visual navigation + scanning/ dual tasking.  High level balance training.    Fonda Simpers, PT, DPT Physical Therapist - [facility]  03/06/24, 2:02 PM

## 2024-03-11 ENCOUNTER — Other Ambulatory Visit: Payer: Self-pay | Admitting: Psychiatry

## 2024-03-11 ENCOUNTER — Ambulatory Visit: Admitting: Occupational Therapy

## 2024-03-11 ENCOUNTER — Ambulatory Visit

## 2024-03-11 DIAGNOSIS — G4701 Insomnia due to medical condition: Secondary | ICD-10-CM

## 2024-03-11 DIAGNOSIS — H543 Unqualified visual loss, both eyes: Secondary | ICD-10-CM

## 2024-03-11 DIAGNOSIS — R262 Difficulty in walking, not elsewhere classified: Secondary | ICD-10-CM

## 2024-03-11 DIAGNOSIS — M6281 Muscle weakness (generalized): Secondary | ICD-10-CM | POA: Diagnosis not present

## 2024-03-11 DIAGNOSIS — R41841 Cognitive communication deficit: Secondary | ICD-10-CM

## 2024-03-11 DIAGNOSIS — R278 Other lack of coordination: Secondary | ICD-10-CM

## 2024-03-11 NOTE — Therapy (Addendum)
 Occupational Therapy Progress Note     Patient Name: Ashley Collins MRN: [id number] DOB:[birth year], 63 y.o., female Today's Date: 03/11/2024  PCP: Harvey Gaetana CROME, NP REFERRING PROVIDER: Harvey Gaetana CROME, NP   OT End of Session - 03/11/24 1321     Visit Number 21    Number of Visits 48    Date for OT Re-Evaluation 05/29/24    OT Start Time 1317    OT Stop Time 1400    OT Time Calculation (min) 43 min    Activity Tolerance Patient tolerated treatment well    Behavior During Therapy WFL for tasks assessed/performed              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   Cardiac arrhythmia    Nonspecific ST T wave changes on EKG   Chronic venous insufficiency of lower extremity    Complication of anesthesia    nausea and vomiting   DDD (degenerative disc disease), lumbosacral    Essential hypertension    Headache    History of kidney stones    Hyperlipidemia    Kidney stones    Lymphedema    Migraines    Osteoporosis    PONV (postoperative nausea and vomiting)    Right ureteral stone    Vitamin B12 deficiency    Vitamin D deficiency    Past Surgical History:  Procedure Laterality Date   AUGMENTATION MAMMAPLASTY     CESAREAN SECTION     x 4   COLONOSCOPY WITH PROPOFOL  N/A 03/31/2022   Procedure: COLONOSCOPY WITH PROPOFOL ;  Surgeon: Onita Elspeth Sharper, DO;  Location: [facility];  Service: Gastroenterology;  Laterality: N/A;   CYSTOSCOPY W/ RETROGRADES Bilateral 07/10/2020   Procedure: CYSTOSCOPY WITH RETROGRADE PYELOGRAM;  Surgeon: Francisca Redell BROCKS, MD;  Location: [facility];  Service: Urology;  Laterality: Bilateral;   CYSTOSCOPY/URETEROSCOPY/HOLMIUM LASER/STENT PLACEMENT     CYSTOSCOPY/URETEROSCOPY/HOLMIUM LASER/STENT PLACEMENT Bilateral 07/10/2020   Procedure: CYSTOSCOPY/URETEROSCOPY/HOLMIUM LASER/STENT PLACEMENT;  Surgeon: Francisca Redell BROCKS, MD;  Location: [facility];  Service: Urology;   Laterality: Bilateral;   CYSTOSCOPY/URETEROSCOPY/HOLMIUM LASER/STENT PLACEMENT Right 08/25/2023   Procedure: CYSTOSCOPY/URETEROSCOPY/HOLMIUM LASER;  Surgeon: Francisca Redell BROCKS, MD;  Location: [facility];  Service: Urology;  Laterality: Right;   CYSTOSCOPY/URETEROSCOPY/HOLMIUM LASER/STENT PLACEMENT Right 12/15/2023   Procedure: CYSTOSCOPY/URETEROSCOPY/HOLMIUM LASER;  Surgeon: Francisca Redell BROCKS, MD;  Location: [facility];  Service: Urology;  Laterality: Right;   EXTRACORPOREAL SHOCK WAVE LITHOTRIPSY     x 10 plus   Eye Lift     FACIAL COSMETIC SURGERY     GANGLION CYST EXCISION Right 12/21/2021   Procedure: REMOVAL GANGLION OF WRIST;  Surgeon: Kathlynn Sharper, MD;  Location: [facility];  Service: Orthopedics;  Laterality: Right;   LIPOSUCTION     TONSILLECTOMY     TRIGGER FINGER RELEASE Right 12/21/2021   Procedure: RELEASE TRIGGER FINGER/A-1 PULLEY;  Surgeon: Kathlynn Sharper, MD;  Location: [facility];  Service: Orthopedics;  Laterality: Right;   URETEROSCOPY WITH HOLMIUM LASER LITHOTRIPSY     Patient Active Problem List   Diagnosis Date Noted   Acute CVA (cerebrovascular accident) (HCC) 12/19/2023   Hypokalemia 12/19/2023   AKI (acute kidney injury) (HCC) 12/19/2023   Leukocytosis 12/19/2023   Hyperlipidemia, unspecified 12/19/2023   Essential hypertension 12/19/2023   Vision changes 12/19/2023   Lymphedema 05/22/2023   Chronic venous insufficiency 05/22/2023   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 replacement therapy 04/10/2023  Insomnia due to medical condition 04/25/2022   GAD (generalized anxiety disorder) 02/02/2022   Other specified depressive episodes 02/02/2022   Long-term current use of benzodiazepine 02/02/2022   Migraine with aura and without status migrainosus, not intractable 11/03/2020   Arrhythmia 08/06/2020   Status migrainosus 02/25/2019   Osteoporosis 04/09/2013   Vitamin D deficiency 04/09/2013   DDD (degenerative disc disease), lumbosacral 02/08/2005   ONSET DATE:  12/18/2023  REFERRING DIAG:   THERAPY DIAG:  Low vision, both eyes  Rationale for Evaluation and Treatment: Rehabilitation  SUBJECTIVE:  SUBJECTIVE STATEMENT: Pt. reports that she feels as though her puzzle books take her longer to complete than they should.  Pt accompanied by: significant other  PERTINENT HISTORY: Pt. has Hx of stroke with onset 12/18/2023. Pt. PMHx includes: HTN, Hyperlipidemia, Kidney Stones, near syncopal event, loss of vision.  PRECAUTIONS: None  WEIGHT BEARING RESTRICTIONS:  PAIN:  Are you having pain? No  FALLS: Has patient fallen in last 6 months? Yes. Number of falls    LIVING ENVIRONMENT: Lives with: lives with their family Lives in: [place], [place] Stairs: No, inside the house, yes but does not use Has following equipment at home:   PLOF: Independent  PATIENT GOALS: To be able to see  OBJECTIVE:  Note: Objective measures were completed at Evaluation unless otherwise noted.  HAND DOMINANCE: Right  ADLs:  Eating: Drinking from straw is different, able to use utensils.  Grooming: Fatigues completing hair care.  UB Dressing: independent, if clothes are in front of her she can find clothing LB Dressing: Independent Toileting: Independent Bathing: Independent Tub Shower transfers: Independent Equipment: none Has difficulty with using cell phone  IADLs: Shopping: Does not typically go out shopping, and has not tried to. Light housekeeping: Pt. reports that she does not typically do house cleaning. Pt. has been able to unpack belongings from boxes due to her recent move in. Meal Prep: Pt. reports that is does not currently cook, however reports that she probably could. Has difficulty opening bottle caps/lids. Community mobility: Requires assistance from husband to navigate  through buildings, negotiate stairs-Pt. With increased fear of falling  Medication management: Able to push down medication bottles to open  Financial management: No change  in the process-uses automatic bill pay system.  Has difficulty with using cell phone Handwriting: N/T Work: Pt. was actively working managing a Health visitor, works a lot of hours  MOBILITY STATUS: Independent and Needs Assist: Requires hand on hand assistance with nvigating through environments.   POSTURE COMMENTS:  No Significant postural limitations Sitting balance: Good  ACTIVITY TOLERANCE: Activity tolerance: Good  FUNCTIONAL OUTCOME MEASURES:   UPPER EXTREMITY ROM:    Active ROM Right Eval WFL Left Eval [facility]  Shoulder flexion    Shoulder abduction    Shoulder adduction    Shoulder extension    Shoulder internal rotation    Shoulder external rotation    Elbow flexion    Elbow extension    Wrist flexion    Wrist extension    Wrist ulnar deviation    Wrist radial deviation    Wrist pronation    Wrist supination    (Blank rows = not tested)  UPPER EXTREMITY MMT:     MMT Right eval Right Left eval Left  Shoulder flexion 4-/5 5/5 4-/5 5/5  Shoulder abduction 4-/5 5/5 4-/5 5/5  Shoulder adduction      Shoulder extension      Shoulder internal rotation      Shoulder  external rotation      Middle trapezius      Lower trapezius      Elbow flexion 5/5 5/5 5/5 5/5  Elbow extension 5/5 5/5 5/5 5/5  Wrist flexion 4/5 5/5 4-/5 5/5  Wrist extension 4/5 5/5 4-/5 5/5  Wrist ulnar deviation      Wrist radial deviation      Wrist pronation      Wrist supination      (Blank rows = not tested)  HAND FUNCTION: Grip strength: Right: 39 lbs; Left: 28 lbs, Lateral pinch: Right: 11 lbs, Left: 9 lbs, and 3 point pinch: Right: 9 lbs, Left: 9 lbs  01/29/24 Grip strength: Right: 39 lbs; Left: 36 lbs, Lateral pinch: Right: 14 lbs, Left: 12 lbs, and 3 point pinch: Right: 12 lbs, Left: 11 lbs   COORDINATION: 9 Hole Peg test: Right: 30 sec; Left: 36 sec  01/29/24 9 Hole Peg test: Right: 24 sec; Left: 23 sec   SENSATION: WFL  EDEMA:   MUSCLE TONE:    COGNITION: Overall cognitive status: Within functional limits for tasks assessed  VISION: Subjective report: Pt. reports changes in her vision at onset of stroke, starting with blurry vision, resulting in vision loss in the R eye.  Baseline vision: Pt. Is able to visually track  Visual history: Hx of eye surgery  VISION ASSESSMENT: TBD  PERCEPTION: WFL  PRAXIS: WFL  OBSERVATIONS:                                                                                                                    TREATMENT DATE: 03/11/24   Therapeutic Activities:   Self-Care:   -Pt. worked on Careers information officer,  and efficiency completing simulated checks while focusing on adjusting letter size, and spacing to be able to fit all words on the appropriate lines.  -Pt. worked on subtracting the Interior and spatial designer amount of each check from the balance. -Pt. worked on filling out a blank calendar following a Therapist, sports of appointments for multiple people with emphasis placed on writing legibility, and spacing  of words in the small designated 3/4 calendar day space.  HOME EXERCISE PROGRAM:  -Writing tasks -visual motor tasks.SABRA  GOALS: Goals reviewed with patient? Yes  SHORT TERM GOALS: Target date: 04/17/2024    Pt. Will independently utilize HEP for hand strength, coordination, and visual compensatory strategies ADL/ADLs Baseline: 03/06/24: Independent Eval: No  current HEP  Goal status: Ongoing   LONG TERM GOALS: Target date: 05/29/2024   Pt. Will be able to independently implement visual scanning/visual search compensatory strategies for tasks within her extra personal space navigating through community environments 100% of the time.  Baseline: 01/29/24:  Independent  100% of the time within the therapy gym, and hallway. Eval: Pt. Requires increased assist to navigate through community environments. Goal status: Achieved  2.  Pt. Will be able to independently utilize visual scanning/visual  search strategies  during ADLS/IADL within her near space, and during tabletop top tasks. 100% of the  time.  Baseline: 03/06/24: Initiates visual scanning 85% of the time  01/29/24: 75% Eval: Pt. Education to be provided.  Goal status: INITIAL  3.  Pt. Will be able to independently initiate visual scanning techniques in her own environment to reduce risk of falls.  Baseline: 01/29/24: Education was provided, Pt. Is utlizing visual compensatory strategies/visual scanning with in her home. Eval: Pt. Education to be provided.  Goal status: Achieved  4.  Pt. Will increase BUE Grip Strength by 5# to be able to independently hold objects for ADL/IADL use. Baseline: 01/29/24: Grip strength: Right: 39 lbs; Left: 28 lbs, Eval: Right Grip: 39#, Left Grip: 28# Goal status: Achieved  5.  Pt. Will increase L Lateral Pinch strength by 2# to be able to independently twist off bottle caps/lids. Baseline: 01/29/24: Lateral pinch: Right: 14 lbs, Left: 12 lbs Eval: Lateral pinch: Right: 11 lbs, Left: 9 lbs  Goal status: Achieved  6.  Pt. Will increase BUE strength for shoulder flexion/abduction by 2 mm grades to independently complete hair care tasks. Baseline: 01/29/24: 5/5 overall Eval: Right Shoulder Flexion: 4-/5, Left Shoulder Flexion:4-/5, Right Shoulder Abduction: 4-/5, Left Shoulder Abduction: 4-/5 Goal status: Achieved  7.  Pt will be indep with medication set up using weekly pill organizer.  Baseline: 01/29/24: Independent 01/15/24: Daughter currently manages medication set up.  Goal status: Achieved  8. Pt will increase typing speed to 20-30 words per minute with at least 90% accuracy to work towards more efficient typing for job related  responsibilities.  Baseline: 03/06/24: 10 wpm with 92% accuracy. 01/29/24: Continue 01/15/24: 8wpm x88% accuracy=7 wpm  Goal status: Progressing/Ongoing  9. Pt will be able to scan small print on store receipts to check for errors with 100% accuracy using visual  compensation strategies as needed.  Baseline: 03/06/24: Pt. Continues to present with difficulty with visual scanning small  small items efficiently. 01/29/24: Pt. Continues to have difficulty scanning small print items. 01/15/24: Not yet attempted; required for return to work/job responsibility  Goal status:  Ongoing  10. Pt. Will independently identify 8 items consistently from visual visual memory in preparation or ADLs.    Baseline: 03/06/24: Pt. Is able to identify up to 6 picture items while seated at the tabletop. Pt. is able to consistently Identify 4 letters from visual memory when attention is divided.  01/29/24: Pt. is able to identify 6 items consistently from visual memory.    Gaol status: Ongoing      11. Pt. will be independently write 4 sentences  efficiently with 100% legibility, no deviation from writing on a blank line, and appropriate spacing between letters.              Baseline: 03/06/24: 4 lines with 75% legibility, positive deviation below the line, and excessive spacing between the words.              Goal status: Ongoing  ASSESSMENT: CLINICAL IMPRESSION:  Pt. is improving with writing legibility, however continues to work on accuracy with appropriate spacing. Pt. was not able to fit all words in the company names within the designated space on 2/6 simulated checks. Reviewed strategies including scanning the words before writing them down in preparation for fitting all of the words on each of the lines. Pt. was able to fill all of scheduled calendar day information on the monthly calendar while using appropriate spacing. Pt. continues to benefit from Occupational Therapy services to improve her ability to use visual compensatory strategies, and improve overall BUE  functioning in order to improve engagement in, and maximize overall independence with ADL, and IADL tasks.  PERFORMANCE DEFICITS: in functional skills including ADLs, IADLs, coordination, dexterity, Fine motor control,  and vision, and psychosocial skills including coping strategies, environmental adaptation, habits, interpersonal interactions, and routines and behaviors.   IMPAIRMENTS: are limiting patient from ADLs, IADLs, rest and sleep, work, leisure, and social participation.   CO-MORBIDITIES: may have co-morbidities  that affects occupational performance. Patient will benefit from skilled OT to address above impairments and improve overall function.  MODIFICATION OR ASSISTANCE TO COMPLETE EVALUATION: Min-Moderate modification of tasks or assist with assess necessary to complete an evaluation.  OT OCCUPATIONAL PROFILE AND HISTORY: Detailed assessment: Review of records and additional review of physical, cognitive, psychosocial history related to current functional performance.  CLINICAL DECISION MAKING: Moderate - several treatment options, min-mod task modification necessary  REHAB POTENTIAL: Good  EVALUATION COMPLEXITY: Moderate    PLAN:  OT FREQUENCY: 2x/week  OT DURATION: 12 weeks  PLANNED INTERVENTIONS: 97168 OT Re-evaluation, 97535 self care/ADL training, 02889 therapeutic exercise, 97530 therapeutic activity, 97112 neuromuscular re-education, visual/perceptual remediation/compensation, patient/family education, and DME and/or AE instructions  RECOMMENDED OTHER SERVICES: ST  CONSULTED AND AGREED WITH PLAN OF CARE: Patient and family member/caregiver  PLAN FOR NEXT SESSION: Treatment  Richardson Otter, MS, OTR/L   03/11/24, 1:24 PM

## 2024-03-11 NOTE — Therapy (Signed)
 OUTPATIENT PHYSICAL THERAPY TREATMENT  Patient Name: Ashley Collins MRN: [id number] DOB:12/10/1960, 63 y.o., female Today's Date: 03/11/2024  PCP: Harvey Gaetana CROME, NP REFERRING PROVIDER: Harvey Gaetana CROME, NP, Prentice Reges, DO   END OF SESSION:  PT End of Session - 03/11/24 1456     Visit Number 6    Number of Visits 6    Date for PT Re-Evaluation 03/19/24    Authorization Type [employer] HMO/PPO    Authorization Time Period 02/20/24-03/19/24    Progress Note Due on Visit 10    PT Start Time 1445    PT Stop Time 1525    PT Time Calculation (min) 40 min    Activity Tolerance Patient tolerated treatment well;No increased pain    Behavior During Therapy WFL for tasks assessed/performed          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   Cardiac arrhythmia    Nonspecific ST T wave changes on EKG   Chronic venous insufficiency of lower extremity    Complication of anesthesia    nausea and vomiting   DDD (degenerative disc disease), lumbosacral    Essential hypertension    Headache    History of kidney stones    Hyperlipidemia    Kidney stones    Lymphedema    Migraines    Osteoporosis    PONV (postoperative nausea and vomiting)    Right ureteral stone    Vitamin B12 deficiency    Vitamin D deficiency    Past Surgical History:  Procedure Laterality Date   AUGMENTATION MAMMAPLASTY     CESAREAN SECTION     x 4   COLONOSCOPY WITH PROPOFOL  N/A 03/31/2022   Procedure: COLONOSCOPY WITH PROPOFOL ;  Surgeon: Onita Elspeth Sharper, DO;  Location: [facility] END[facility]COPY;  Service: Gastroenterology;  Laterality: N/A;   CYSTOSCOPY W/ RETROGRADES Bilateral 07/10/2020   Procedure: CYSTOSCOPY WITH RETROGRADE PYELOGRAM;  Surgeon: Francisca Redell BROCKS, MD;  Location: [facility] ORS;  Service: Urology;  Laterality: Bilateral;   CYSTOSCOPY/URETEROSCOPY/HOLMIUM LASER/STENT PLACEMENT     CYSTOSCOPY/URETEROSCOPY/HOLMIUM LASER/STENT  PLACEMENT Bilateral 07/10/2020   Procedure: CYSTOSCOPY/URETEROSCOPY/HOLMIUM LASER/STENT PLACEMENT;  Surgeon: Francisca Redell BROCKS, MD;  Location: [facility] ORS;  Service: Urology;  Laterality: Bilateral;   CYSTOSCOPY/URETEROSCOPY/HOLMIUM LASER/STENT PLACEMENT Right 08/25/2023   Procedure: CYSTOSCOPY/URETEROSCOPY/HOLMIUM LASER;  Surgeon: Francisca Redell BROCKS, MD;  Location: [facility] ORS;  Service: Urology;  Laterality: Right;   CYSTOSCOPY/URETEROSCOPY/HOLMIUM LASER/STENT PLACEMENT Right 12/15/2023   Procedure: CYSTOSCOPY/URETEROSCOPY/HOLMIUM LASER;  Surgeon: Francisca Redell BROCKS, MD;  Location: [facility] ORS;  Service: Urology;  Laterality: Right;   EXTRACORPOREAL SHOCK WAVE LITHOTRIPSY     x 10 plus   Eye Lift     FACIAL COSMETIC SURGERY     GANGLION CYST EXCISION Right 12/21/2021   Procedure: REMOVAL GANGLION OF WRIST;  Surgeon: Kathlynn Sharper, MD;  Location: [facility] ORS;  Service: Orthopedics;  Laterality: Right;   LIPOSUCTION     TONSILLECTOMY     TRIGGER FINGER RELEASE Right 12/21/2021   Procedure: RELEASE TRIGGER FINGER/A-1 PULLEY;  Surgeon: Kathlynn Sharper, MD;  Location: [facility] ORS;  Service: Orthopedics;  Laterality: Right;   URETEROSCOPY WITH HOLMIUM LASER LITHOTRIPSY     Patient Active Problem List   Diagnosis Date Noted   Acute CVA (cerebrovascular accident) (HCC) 12/19/2023   Hypokalemia 12/19/2023   AKI (acute kidney injury) (HCC) 12/19/2023   Leukocytosis 12/19/2023   Hyperlipidemia, unspecified 12/19/2023   Essential hypertension  12/19/2023   Vision changes 12/19/2023   Lymphedema 05/22/2023   Chronic venous insufficiency 05/22/2023   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 replacement therapy 04/10/2023   Insomnia due to medical condition 04/25/2022   GAD (generalized anxiety disorder) 02/02/2022   Other specified depressive episodes 02/02/2022   Long-term current use of benzodiazepine 02/02/2022   Migraine with aura and without status migrainosus, not intractable 11/03/2020   Arrhythmia 08/06/2020   Status  migrainosus 02/25/2019   Osteoporosis 04/09/2013   Vitamin D deficiency 04/09/2013   DDD (degenerative disc disease), lumbosacral 02/08/2005   ONSET DATE: July 2025 REFERRING DIAG: s/p CVA; chronic Left knee pain  THERAPY DIAG:  Muscle weakness (generalized)  Other lack of coordination  Difficulty in walking, not elsewhere classified  Rationale for Evaluation and Treatment: Rehabilitation  SUBJECTIVE:                                                                                                                                                                                             SUBJECTIVE STATEMENT: Pt doing well. Did an outing to [place], still getting adjusted to being around crowds of people.    Pt accompanied by: significant other  PERTINENT HISTORY:   63yoF who sustained a CVA in July 2025 with visual field loss and memory impairment, now followed by [facility] neurology and [facility]mology. Pt has been working with OT here since July, OT noted some episodic imbalance and mild cognitive impairment hence recommended referral to OPPT and OPST. PCP sent referrals for these 3rd week of August. Pt also seen by [facility] sports medicine for chronic Left knee pain s/p remote menisectomy, MRI results pending, also sent a referral to PT for Left knee pain. Pt has memory difficulty since CVA which makes it difficult for her to specific particular deficits or difficulty with mobility since CVA, however OT has seen some episodic stumbling and LOB after bending forward to reach. Pt reports limited confidence in balance overall when walking out of house, will hold onto husband, is startled by new visual input in crowded spaces pertaining to visual deficits. 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   PAIN:  Are you having pain? No  PRECAUTIONS: None   WEIGHT BEARING RESTRICTIONS: No  FALLS: Has  patient fallen in last 6 months? 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   PATIENT GOALS: Return to work, full confident mobility/activity.   OBJECTIVE:  Note: Objective measures were completed at Evaluation unless otherwise noted.                                                                                                                             TREATMENT DATE 03/11/24:   -ADL kitchen alphabet scan and tap with red mat and falls mat (5 minutes 45sec)  -1/.5lb AW donned bilat wrists, 2lb AW donned BLE -> AMB to stairwell, 26 stairs up to 1st floor, AMB  to stairwell  near Lab, then 26 stairs down, outside into MD parking area for curb step navigation practice, then AMB back into building to wave crest for scavenger hunt for various bottled drinks and chip bags, AMB back to rehab gym.  -ADL kitchen alphabet scan and tap with red mat and falls mat (weights on this time) (5 minutes 45sec)    PATIENT EDUCATION: Education details: findings for balance deficits through minibest and how it relates to functional balance at work.  encouraged to initiate walking program on treadmill.  Person educated: Patient, husband Education method: collaborative learning, deliberate practice, positive reinforcement, explicit instruction, establish rules. Education comprehension: verbalized understanding  HOME EXERCISE PROGRAM: Repeat exposure to public spaces up to 1 hours outings to practice spatial navigation, awareness, and visual reintegration. Visual scanning of traffic from passenger seat of car for visual reintegration.   GOALS: Goals reviewed with patient? No  SHORT TERM GOALS: Target date: 02/25/24  Pt to report understanding of importance of progressive reintegration into community outings with progressive  increase in length and complexity.  Baseline: Goal status: INITIAL  2.  Pt to reports ability to navigate simple, uncrowded public spaces at supervision level without use of SO for support and with use of railings prn.  Baseline:  Goal status: INITIAL  3.  Pt to report participation in regular long distance walking with SO 3x/week starting at 25 minutes per walk or greater.  Baseline:  Goal status: INITIAL  4. Pt will increase Minibest by > 4 points to indicate improved safety with community mobility and reduced fall risk.  Baseline: 18 Goal status: initial    ASSESSMENT:  CLINICAL IMPRESSION: Continued to advance overground mobility and visual scanning needs. Pt again encouraged to increase trips into community into retail spaces to practice stimulation which she remains quite nervous about despite excellent performance in clinic spaces. Pt does very well with multiple curb step practices in parking lot with weights in place, unclear why the incongruent level of confidence with performance, however we discussed the importance of positive self talk and building confidence for tasks that she is able to perform. Asked pt to take a week off prior to return to make 5 more community trips prior to return. Plan to DC after next visit as empirical impairments are minute at this point and primary recommendation is for community reintegration. Pt will  continue to benefit from skilled therapy to address remaining deficits in order to improve overall QoL and return to PLOF.     OBJECTIVE IMPAIRMENTS: decreased activity tolerance, decreased cognition, decreased endurance, difficulty walking, decreased safety awareness, and impaired perceived functional ability.   ACTIVITY LIMITATIONS: bending and stairs  PARTICIPATION LIMITATIONS: medication management, interpersonal relationship, driving, shopping, community activity, and occupation  PERSONAL FACTORS: Profession and Time since onset of  injury/illness/exacerbation are also affecting patient's functional outcome.   REHAB POTENTIAL: Poor  CLINICAL DECISION MAKING: Evolving/moderate complexity  EVALUATION COMPLEXITY: High  PLAN:  PT FREQUENCY: 1-2x/week  PT DURATION: 4 weeks  PLANNED INTERVENTIONS: 97750- Physical Performance Testing, 97110-Therapeutic exercises, 97530- Therapeutic activity, 97112- Neuromuscular re-education, 97535- Self Care, 02859- Manual therapy, [phone]- Gait training, Patient/Family education, Balance training, Stair training, and Cognitive remediation  PLAN FOR NEXT SESSION:  Reassess goals, promote frequent return to community retail spaces, DC from PT.    3:01 PM, 03/11/24 Peggye JAYSON Linear, PT, DPT Physical Therapist - [facility]  Outpatient Physical Therapy- Main Campus [phone]

## 2024-03-11 NOTE — Therapy (Signed)
 OUTPATIENT SPEECH LANGUAGE PATHOLOGY  TREATMENT   Patient Name: Ashley Collins MRN: [id number] DOB:June 07, 1961, 62 y.o., female Today's Date: 03/11/2024  PCP: Gaetana Haddock, NP  REFERRING PROVIDER: same   End of Session - 03/11/24 1346     Visit Number 5    Number of Visits 24    Date for SLP Re-Evaluation 05/14/24    SLP Start Time 1400    SLP Stop Time  1445    SLP Time Calculation (min) 45 min    Activity Tolerance Patient tolerated treatment well           Patient Active Problem List   Diagnosis Date Noted   Acute CVA (cerebrovascular accident) (HCC) 12/19/2023   Hypokalemia 12/19/2023   AKI (acute kidney injury) (HCC) 12/19/2023   Leukocytosis 12/19/2023   Hyperlipidemia, unspecified 12/19/2023   Essential hypertension 12/19/2023   Vision changes 12/19/2023   Lymphedema 05/22/2023   Chronic venous insufficiency 05/22/2023   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 replacement therapy 04/10/2023   Insomnia due to medical condition 04/25/2022   GAD (generalized anxiety disorder) 02/02/2022   Other specified depressive episodes 02/02/2022   Long-term current use of benzodiazepine 02/02/2022   Migraine with aura and without status migrainosus, not intractable 11/03/2020   Arrhythmia 08/06/2020   Status migrainosus 02/25/2019   Osteoporosis 04/09/2013   Vitamin D deficiency 04/09/2013   DDD (degenerative disc disease), lumbosacral 02/08/2005    ONSET DATE: 12/19/23   REFERRING DIAG: CVA- memory deficits  THERAPY DIAG:  Cognitive communication deficit  Rationale for Evaluation and Treatment Rehabilitation  SUBJECTIVE:   SUBJECTIVE STATEMENT: Pt alert, pleasant, and cooperative. Pt accompanied by: self and significant other  PERTINENT HISTORY & DIAGNOSTIC FINDINGS: Pt is 63 y.o. female who presents today for a cognitive-communication evaluation in setting of stroke. MRI 12/18/23 1. 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 No  associated hemorrhage or mass effect. PMHx as outlined above.  PAIN:  Are you having pain? No   FALLS: Has patient fallen in last 6 months?  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   PATIENT GOALS  to return to PLOF   OBJECTIVE:  Reviewed concept of use of journaling to improve short term and prospective memory. Pt has been journaling to improve short term and prospective memory. Reviewed attention strategies such as telling self ot mentally focus and to narrate actions aloud (e.g. when cooking, placing items down, or while going to get something from another room). Pt endorsed utilizing at home with success. Reviewed modified Pomodoro method with pt taking regularly schedule rest breaks. Pt to schedule day utilizing calendar as if she was at work with consideration for priority tasks and building in rest breaks. Reviewed types of memory and external memory strategies. Pt has been using calendar on iPhone. Reviewed time study noting pt requiring increased time for more tasks. Discussed ramifications for daily planning. Pt given task planning work sheet to help planning incremental steps and improve time management. Prospective memory targeting with pt recalling 2/2 items pt needed to do during session without difficulty. Pt given task for next session targeting prospective memory as well.    PATIENT EDUCATION: Education details: as above Person educated: Patient and Spouse Education method: Explanation; Handout Education comprehension: verbalized understanding  HOME EXERCISE PROGRAM:  Practice narrating actions  Telling self to mentally focus  Continue calendar use, blocking schedule, and journaling  Time study worksheet    GOALS:  Goals reviewed with  patient? Yes  SHORT TERM GOALS: Target date: 10 sessions  Pt will complete PROM re: memory.  Baseline: Goal status: MET   2.  Pt will endorse successful implementation of at least x2 compensations for attention and memory.  Baseline:  Goal status: INITIAL  3.  With Moderate A, patient will establish external aid for memory/executive function and bring to more than 50% of therapy sessions.    Baseline:  Goal status: INITIAL    LONG TERM GOALS: Target date: 12 weeks  Pt will endorse improvement in cognitive-communication per PROM.  Baseline:  Goal status: INITIAL  2.  Pt and/or husband will demonstrate understanding of ways to promote and support cognitive-communication outside of SLP sessions.  Baseline:  Goal status: INITIAL   ASSESSMENT:  CLINICAL IMPRESSION:  Pt is 63 y.o. female who presents today for a cognitive-communication treatment in setting of stroke. Initial assessment completed via formal means (Cognitive-Linguistic Quick Test) and PROM (Neuro-QoL Adult Cognitive Function v2.0). Pt presents with at least mild cognitive-communication deficits affecting attention and visuospatial skills per CLQT as well as memory and executive functioning per PROM. Suspect CLQT is not as sensitive to higher level cognitive-communication deficits appreciated by pt during iADLs. See details of today's tx as outlined above. Recommend skilled ST services targeting above mentioned deficits to improve QoL and performance on ADLs/IADLs.  OBJECTIVE IMPAIRMENTS include attention, memory, executive functioning, and visuospatial deficits. These impairments are limiting patient from return to work, managing appointments, household responsibilities, and ADLs/IADLs. Factors affecting potential to achieve goals and functional outcome are medical prognosis.. Patient will benefit from skilled SLP services to address above impairments and improve overall function.  REHAB POTENTIAL: Good  PLAN: SLP  FREQUENCY: 2x/week  SLP DURATION: 12 weeks  PLANNED INTERVENTIONS: Cueing hierachy, Cognitive reorganization, Internal/external aids, Functional tasks, SLP instruction and feedback, Compensatory strategies, and Patient/family education    Delon Bangs, M.S., CCC-SLP Speech-Language Pathologist [facility] [phone] [place])  [facility] [address] [place], [place], [address] Phone: [phone]   Fax:  [phone]

## 2024-03-12 ENCOUNTER — Telehealth: Payer: Self-pay

## 2024-03-12 DIAGNOSIS — F411 Generalized anxiety disorder: Secondary | ICD-10-CM

## 2024-03-12 MED ORDER — LORAZEPAM 0.5 MG PO TABS: 0.5000 mg | ORAL_TABLET | ORAL | 2 refills | Status: DC

## 2024-03-12 NOTE — Telephone Encounter (Signed)
 Medication refill - Call message from patient requesting a new Lorazepam  0.5 mg order, last provided on 02/15/24 for 26 tablets to patient's [facility]. Patient returns next on 06/04/24.

## 2024-03-12 NOTE — Telephone Encounter (Signed)
 Medication management -message left for pt that Dr. Coby had sent in a new Lorazepam  order for her as requested to her [facility].

## 2024-03-12 NOTE — Telephone Encounter (Signed)
 I have sent lorazepam  to pharmacy as requested.

## 2024-03-13 ENCOUNTER — Ambulatory Visit

## 2024-03-13 ENCOUNTER — Ambulatory Visit: Admitting: Occupational Therapy

## 2024-03-13 ENCOUNTER — Ambulatory Visit: Admitting: Physical Therapy

## 2024-03-13 DIAGNOSIS — R41841 Cognitive communication deficit: Secondary | ICD-10-CM

## 2024-03-13 DIAGNOSIS — M6281 Muscle weakness (generalized): Secondary | ICD-10-CM | POA: Diagnosis not present

## 2024-03-13 NOTE — Therapy (Signed)
 Occupational Therapy Progress Note     Patient Name: Ashley Collins MRN: [id number] DOB:[birth year], 63 y.o., female Today's Date: 03/13/2024  PCP: Harvey Gaetana CROME, NP REFERRING PROVIDER: Harvey Gaetana CROME, NP   OT End of Session - 03/13/24 1326     Visit Number 22    Number of Visits 48    Date for OT Re-Evaluation 05/29/24    OT Start Time 1315    OT Stop Time 1400    OT Time Calculation (min) 45 min    Activity Tolerance Patient tolerated treatment well    Behavior During Therapy WFL for tasks assessed/performed              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   Cardiac arrhythmia    Nonspecific ST T wave changes on EKG   Chronic venous insufficiency of lower extremity    Complication of anesthesia    nausea and vomiting   DDD (degenerative disc disease), lumbosacral    Essential hypertension    Headache    History of kidney stones    Hyperlipidemia    Kidney stones    Lymphedema    Migraines    Osteoporosis    PONV (postoperative nausea and vomiting)    Right ureteral stone    Vitamin B12 deficiency    Vitamin D deficiency    Past Surgical History:  Procedure Laterality Date   AUGMENTATION MAMMAPLASTY     CESAREAN SECTION     x 4   COLONOSCOPY WITH PROPOFOL  N/A 03/31/2022   Procedure: COLONOSCOPY WITH PROPOFOL ;  Surgeon: Onita Elspeth Sharper, DO;  Location: [facility] ENDOSCOPY;  Service: Gastroenterology;  Laterality: N/A;   CYSTOSCOPY W/ RETROGRADES Bilateral 07/10/2020   Procedure: CYSTOSCOPY WITH RETROGRADE PYELOGRAM;  Surgeon: Francisca Redell BROCKS, MD;  Location: [facility] ORS;  Service: Urology;  Laterality: Bilateral;   CYSTOSCOPY/URETEROSCOPY/HOLMIUM LASER/STENT PLACEMENT     CYSTOSCOPY/URETEROSCOPY/HOLMIUM LASER/STENT PLACEMENT Bilateral 07/10/2020   Procedure: CYSTOSCOPY/URETEROSCOPY/HOLMIUM LASER/STENT PLACEMENT;  Surgeon: Francisca Redell BROCKS, MD;  Location: [facility] ORS;  Service: Urology;   Laterality: Bilateral;   CYSTOSCOPY/URETEROSCOPY/HOLMIUM LASER/STENT PLACEMENT Right 08/25/2023   Procedure: CYSTOSCOPY/URETEROSCOPY/HOLMIUM LASER;  Surgeon: Francisca Redell BROCKS, MD;  Location: [facility] ORS;  Service: Urology;  Laterality: Right;   CYSTOSCOPY/URETEROSCOPY/HOLMIUM LASER/STENT PLACEMENT Right 12/15/2023   Procedure: CYSTOSCOPY/URETEROSCOPY/HOLMIUM LASER;  Surgeon: Francisca Redell BROCKS, MD;  Location: [facility] ORS;  Service: Urology;  Laterality: Right;   EXTRACORPOREAL SHOCK WAVE LITHOTRIPSY     x 10 plus   Eye Lift     FACIAL COSMETIC SURGERY     GANGLION CYST EXCISION Right 12/21/2021   Procedure: REMOVAL GANGLION OF WRIST;  Surgeon: Kathlynn Sharper, MD;  Location: [facility] ORS;  Service: Orthopedics;  Laterality: Right;   LIPOSUCTION     TONSILLECTOMY     TRIGGER FINGER RELEASE Right 12/21/2021   Procedure: RELEASE TRIGGER FINGER/A-1 PULLEY;  Surgeon: Kathlynn Sharper, MD;  Location: [facility] ORS;  Service: Orthopedics;  Laterality: Right;   URETEROSCOPY WITH HOLMIUM LASER LITHOTRIPSY     Patient Active Problem List   Diagnosis Date Noted   Acute CVA (cerebrovascular accident) (HCC) 12/19/2023   Hypokalemia 12/19/2023   AKI (acute kidney injury) (HCC) 12/19/2023   Leukocytosis 12/19/2023   Hyperlipidemia, unspecified 12/19/2023   Essential hypertension 12/19/2023   Vision changes 12/19/2023   Lymphedema 05/22/2023   Chronic venous insufficiency 05/22/2023   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 replacement therapy 04/10/2023  Insomnia due to medical condition 04/25/2022   GAD (generalized anxiety disorder) 02/02/2022   Other specified depressive episodes 02/02/2022   Long-term current use of benzodiazepine 02/02/2022   Migraine with aura and without status migrainosus, not intractable 11/03/2020   Arrhythmia 08/06/2020   Status migrainosus 02/25/2019   Osteoporosis 04/09/2013   Vitamin D deficiency 04/09/2013   DDD (degenerative disc disease), lumbosacral 02/08/2005   ONSET DATE:  12/18/2023  REFERRING DIAG:   THERAPY DIAG:  Muscle weakness (generalized)  Rationale for Evaluation and Treatment: Rehabilitation  SUBJECTIVE:  SUBJECTIVE STATEMENT: Pt. reports having a follow-up appointment eye appointment next month. Pt accompanied by: significant other  PERTINENT HISTORY: Pt. has Hx of stroke with onset 12/18/2023. Pt. PMHx includes: HTN, Hyperlipidemia, Kidney Stones, near syncopal event, loss of vision.  PRECAUTIONS: None  WEIGHT BEARING RESTRICTIONS:  PAIN:  Are you having pain? No  FALLS: Has patient fallen in last 6 months? Yes. Number of falls    LIVING ENVIRONMENT: Lives with: lives with their family Lives in: [place], [place] Stairs: No, inside the house, yes but does not use Has following equipment at home:   PLOF: Independent  PATIENT GOALS: To be able to see  OBJECTIVE:  Note: Objective measures were completed at Evaluation unless otherwise noted.  HAND DOMINANCE: Right  ADLs:  Eating: Drinking from straw is different, able to use utensils.  Grooming: Fatigues completing hair care.  UB Dressing: independent, if clothes are in front of her she can find clothing LB Dressing: Independent Toileting: Independent Bathing: Independent Tub Shower transfers: Independent Equipment: none Has difficulty with using cell phone  IADLs: Shopping: Does not typically go out shopping, and has not tried to. Light housekeeping: Pt. reports that she does not typically do house cleaning. Pt. has been able to unpack belongings from boxes due to her recent move in. Meal Prep: Pt. reports that is does not currently cook, however reports that she probably could. Has difficulty opening bottle caps/lids. Community mobility: Requires assistance from husband to navigate  through buildings, negotiate stairs-Pt. With increased fear of falling  Medication management: Able to push down medication bottles to open  Financial management: No change in the process-uses  automatic bill pay system.  Has difficulty with using cell phone Handwriting: N/T Work: Pt. was actively working managing a Health visitor, works a lot of hours  MOBILITY STATUS: Independent and Needs Assist: Requires hand on hand assistance with nvigating through environments.   POSTURE COMMENTS:  No Significant postural limitations Sitting balance: Good  ACTIVITY TOLERANCE: Activity tolerance: Good  FUNCTIONAL OUTCOME MEASURES:   UPPER EXTREMITY ROM:    Active ROM Right Eval WFL Left Eval [facility]  Shoulder flexion    Shoulder abduction    Shoulder adduction    Shoulder extension    Shoulder internal rotation    Shoulder external rotation    Elbow flexion    Elbow extension    Wrist flexion    Wrist extension    Wrist ulnar deviation    Wrist radial deviation    Wrist pronation    Wrist supination    (Blank rows = not tested)  UPPER EXTREMITY MMT:     MMT Right eval Right Left eval Left  Shoulder flexion 4-/5 5/5 4-/5 5/5  Shoulder abduction 4-/5 5/5 4-/5 5/5  Shoulder adduction      Shoulder extension      Shoulder internal rotation      Shoulder external rotation      Middle trapezius  Lower trapezius      Elbow flexion 5/5 5/5 5/5 5/5  Elbow extension 5/5 5/5 5/5 5/5  Wrist flexion 4/5 5/5 4-/5 5/5  Wrist extension 4/5 5/5 4-/5 5/5  Wrist ulnar deviation      Wrist radial deviation      Wrist pronation      Wrist supination      (Blank rows = not tested)  HAND FUNCTION: Grip strength: Right: 39 lbs; Left: 28 lbs, Lateral pinch: Right: 11 lbs, Left: 9 lbs, and 3 point pinch: Right: 9 lbs, Left: 9 lbs  01/29/24 Grip strength: Right: 39 lbs; Left: 36 lbs, Lateral pinch: Right: 14 lbs, Left: 12 lbs, and 3 point pinch: Right: 12 lbs, Left: 11 lbs   COORDINATION: 9 Hole Peg test: Right: 30 sec; Left: 36 sec  01/29/24 9 Hole Peg test: Right: 24 sec; Left: 23 sec   SENSATION: WFL  EDEMA:   MUSCLE TONE:   COGNITION: Overall cognitive  status: Within functional limits for tasks assessed  VISION: Subjective report: Pt. reports changes in her vision at onset of stroke, starting with blurry vision, resulting in vision loss in the R eye.  Baseline vision: Pt. Is able to visually track  Visual history: Hx of eye surgery  VISION ASSESSMENT: TBD  PERCEPTION: WFL  PRAXIS: WFL  OBSERVATIONS:                                                                                                                    TREATMENT DATE: 03/13/24   Therapeutic Activities:   Self-Care:   -Pt. worked on Careers information officer, and efficiency completing simulated checks while focusing on adjusting letter size, and spacing to be able to fit all words on the appropriate lines.  -Pt. worked on subtracting the Interior and spatial designer amount of each check from the balance. -Pt. was further challenged with dual tasking while performing the check writing task.   HOME EXERCISE PROGRAM:  -Writing tasks -visual motor tasks.SABRA  GOALS: Goals reviewed with patient? Yes  SHORT TERM GOALS: Target date: 04/17/2024    Pt. Will independently utilize HEP for hand strength, coordination, and visual compensatory strategies ADL/ADLs Baseline: 03/06/24: Independent Eval: No  current HEP  Goal status: Ongoing   LONG TERM GOALS: Target date: 05/29/2024   Pt. Will be able to independently implement visual scanning/visual search compensatory strategies for tasks within her extra personal space navigating through community environments 100% of the time.  Baseline: 01/29/24:  Independent  100% of the time within the therapy gym, and hallway. Eval: Pt. Requires increased assist to navigate through community environments. Goal status: Achieved  2.  Pt. Will be able to independently utilize visual scanning/visual search strategies  during ADLS/IADL within her near space, and during tabletop top tasks. 100% of the time.  Baseline: 03/06/24: Initiates visual scanning 85% of the  time  01/29/24: 75% Eval: Pt. Education to be provided.  Goal status: INITIAL  3.  Pt. Will be able to independently initiate visual scanning techniques  in her own environment to reduce risk of falls.  Baseline: 01/29/24: Education was provided, Pt. Is utlizing visual compensatory strategies/visual scanning with in her home. Eval: Pt. Education to be provided.  Goal status: Achieved  4.  Pt. Will increase BUE Grip Strength by 5# to be able to independently hold objects for ADL/IADL use. Baseline: 01/29/24: Grip strength: Right: 39 lbs; Left: 28 lbs, Eval: Right Grip: 39#, Left Grip: 28# Goal status: Achieved  5.  Pt. Will increase L Lateral Pinch strength by 2# to be able to independently twist off bottle caps/lids. Baseline: 01/29/24: Lateral pinch: Right: 14 lbs, Left: 12 lbs Eval: Lateral pinch: Right: 11 lbs, Left: 9 lbs  Goal status: Achieved  6.  Pt. Will increase BUE strength for shoulder flexion/abduction by 2 mm grades to independently complete hair care tasks. Baseline: 01/29/24: 5/5 overall Eval: Right Shoulder Flexion: 4-/5, Left Shoulder Flexion:4-/5, Right Shoulder Abduction: 4-/5, Left Shoulder Abduction: 4-/5 Goal status: Achieved  7.  Pt will be indep with medication set up using weekly pill organizer.  Baseline: 01/29/24: Independent 01/15/24: Daughter currently manages medication set up.  Goal status: Achieved  8. Pt will increase typing speed to 20-30 words per minute with at least 90% accuracy to work towards more efficient typing for job related  responsibilities.  Baseline: 03/06/24: 10 wpm with 92% accuracy. 01/29/24: Continue 01/15/24: 8wpm x88% accuracy=7 wpm  Goal status: Progressing/Ongoing  9. Pt will be able to scan small print on store receipts to check for errors with 100% accuracy using visual compensation strategies as needed.  Baseline: 03/06/24: Pt. Continues to present with difficulty with visual scanning small  small items efficiently. 01/29/24: Pt. Continues to  have difficulty scanning small print items. 01/15/24: Not yet attempted; required for return to work/job responsibility  Goal status:  Ongoing  10. Pt. Will independently identify 8 items consistently from visual visual memory in preparation or ADLs.    Baseline: 03/06/24: Pt. Is able to identify up to 6 picture items while seated at the tabletop. Pt. is able to consistently Identify 4 letters from visual memory when attention is divided.  01/29/24: Pt. is able to identify 6 items consistently from visual memory.    Gaol status: Ongoing      11. Pt. will be independently write 4 sentences  efficiently with 100% legibility, no deviation from writing on a blank line, and appropriate spacing between letters.              Baseline: 03/06/24: 4 lines with 75% legibility, positive deviation below the line, and excessive spacing between the words.              Goal status: Ongoing  ASSESSMENT: CLINICAL IMPRESSION:  Pt. continues to improve with writing  legibility, however continues to work on accuracy with appropriate spacing. Pt. was not able to fit all words in the company names within the designated space simulated checks with 100% accuracy. Pt. required increased time to complete check writing, filling out the check register, and subtracting the balances. Pt. Presented with inconsistencies with subtracting balances. Pt. continues to benefit from Occupational Therapy services to improve her ability to use visual compensatory strategies, and improve overall BUE functioning in order to improve engagement in, and maximize overall independence with ADL, and IADL tasks.  PERFORMANCE DEFICITS: in functional skills including ADLs, IADLs, coordination, dexterity, Fine motor control, and vision, and psychosocial skills including coping strategies, environmental adaptation, habits, interpersonal interactions, and routines and behaviors.   IMPAIRMENTS: are limiting  patient from ADLs, IADLs, rest and sleep, work,  leisure, and social participation.   CO-MORBIDITIES: may have co-morbidities  that affects occupational performance. Patient will benefit from skilled OT to address above impairments and improve overall function.  MODIFICATION OR ASSISTANCE TO COMPLETE EVALUATION: Min-Moderate modification of tasks or assist with assess necessary to complete an evaluation.  OT OCCUPATIONAL PROFILE AND HISTORY: Detailed assessment: Review of records and additional review of physical, cognitive, psychosocial history related to current functional performance.  CLINICAL DECISION MAKING: Moderate - several treatment options, min-mod task modification necessary  REHAB POTENTIAL: Good  EVALUATION COMPLEXITY: Moderate    PLAN:  OT FREQUENCY: 2x/week  OT DURATION: 12 weeks  PLANNED INTERVENTIONS: 97168 OT Re-evaluation, 97535 self care/ADL training, 02889 therapeutic exercise, 97530 therapeutic activity, 97112 neuromuscular re-education, visual/perceptual remediation/compensation, patient/family education, and DME and/or AE instructions  RECOMMENDED OTHER SERVICES: ST  CONSULTED AND AGREED WITH PLAN OF CARE: Patient and family member/caregiver  PLAN FOR NEXT SESSION: Treatment  Richardson Otter, MS, OTR/L   03/13/24, 1:28 PM

## 2024-03-13 NOTE — Therapy (Signed)
 OUTPATIENT SPEECH LANGUAGE PATHOLOGY  TREATMENT   Patient Name: Ashley Collins MRN: [id number] DOB:[birth year], 63 y.o., female Today's Date: 03/13/2024  PCP: Gaetana Haddock, NP  REFERRING PROVIDER: same   End of Session - 03/13/24 1359     Visit Number 6    Number of Visits 24    Date for SLP Re-Evaluation 05/14/24    SLP Start Time 1400    SLP Stop Time  1445    SLP Time Calculation (min) 45 min    Activity Tolerance Patient tolerated treatment well           Patient Active Problem List   Diagnosis Date Noted   Acute CVA (cerebrovascular accident) (HCC) 12/19/2023   Hypokalemia 12/19/2023   AKI (acute kidney injury) (HCC) 12/19/2023   Leukocytosis 12/19/2023   Hyperlipidemia, unspecified 12/19/2023   Essential hypertension 12/19/2023   Vision changes 12/19/2023   Lymphedema 05/22/2023   Chronic venous insufficiency 05/22/2023   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 replacement therapy 04/10/2023   Insomnia due to medical condition 04/25/2022   GAD (generalized anxiety disorder) 02/02/2022   Other specified depressive episodes 02/02/2022   Long-term current use of benzodiazepine 02/02/2022   Migraine with aura and without status migrainosus, not intractable 11/03/2020   Arrhythmia 08/06/2020   Status migrainosus 02/25/2019   Osteoporosis 04/09/2013   Vitamin D deficiency 04/09/2013   DDD (degenerative disc disease), lumbosacral 02/08/2005    ONSET DATE: 12/19/23   REFERRING DIAG: CVA- memory deficits  THERAPY DIAG:  Cognitive communication deficit  Rationale for Evaluation and Treatment Rehabilitation  SUBJECTIVE:   SUBJECTIVE STATEMENT: Pt alert, pleasant, and cooperative. Pt accompanied by: self and significant other  PERTINENT HISTORY & DIAGNOSTIC FINDINGS: Pt is 63 y.o. female who presents today for a cognitive-communication evaluation in setting of stroke. MRI 12/18/23 1. 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 No  associated hemorrhage or mass effect. PMHx as outlined above.  PAIN:  Are you having pain? No   FALLS: Has patient fallen in last 6 months?  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   PATIENT GOALS  to return to PLOF   OBJECTIVE:  TODAY'S TREATMENT: Reviewed concept of use of journaling to improve short term and prospective memory. Pt has been journaling to improve short term and prospective memory. Reviewed attention strategies such as telling self ot mentally focus and to narrate actions aloud (e.g. when cooking, placing items down, or while going to get something from another room). Pt endorsed utilizing at home with success. Reviewed modified Pomodoro method with pt taking regularly schedule rest breaks. Pt to schedule day utilizing calendar as if she was at work with consideration for priority tasks and building in rest breaks. Reviewed types of memory and external memory strategies. Pt has been using calendar on iPhone. Prospective memory targeted and pt completed task prescribed last session (bringing in family photo). Pt noted using written reminder which she paired with an everyday task (taking meds). Education and supportive counseling provided re: pt's concerns for returning to work, adaptations for success, and reducing cognitive load/managing stress.  PATIENT EDUCATION: Education details: as above Person educated: Patient and Spouse Education method: Explanation; Handout Education comprehension: verbalized understanding  HOME EXERCISE PROGRAM:        Practice narrating actions  Telling self to  mentally focus  Continue calendar use, blocking schedule, and journaling     GOALS:  Goals reviewed with patient? Yes  SHORT TERM GOALS: Target date: 10  sessions  Pt will complete PROM re: memory.  Baseline: Goal status: MET   2.  Pt will endorse successful implementation of at least x2 compensations for attention and memory.  Baseline:  Goal status: INITIAL  3.  With Moderate A, patient will establish external aid for memory/executive function and bring to more than 50% of therapy sessions.    Baseline:  Goal status: INITIAL    LONG TERM GOALS: Target date: 12 weeks  Pt will endorse improvement in cognitive-communication per PROM.  Baseline:  Goal status: INITIAL  2.  Pt and/or husband will demonstrate understanding of ways to promote and support cognitive-communication outside of SLP sessions.  Baseline:  Goal status: INITIAL   ASSESSMENT:  CLINICAL IMPRESSION:  Pt is 64 y.o. female who presents today for a cognitive-communication treatment in setting of stroke. Initial assessment completed via formal means (Cognitive-Linguistic Quick Test) and PROM (Neuro-QoL Adult Cognitive Function v2.0). Pt presents with at least mild cognitive-communication deficits affecting attention and visuospatial skills per CLQT as well as memory and executive functioning per PROM. Suspect CLQT is not as sensitive to higher level cognitive-communication deficits appreciated by pt during iADLs. See details of today's tx as outlined above. Recommend skilled ST services targeting above mentioned deficits to improve QoL and performance on ADLs/IADLs.  OBJECTIVE IMPAIRMENTS include attention, memory, executive functioning, and visuospatial deficits. These impairments are limiting patient from return to work, managing appointments, household responsibilities, and ADLs/IADLs. Factors affecting potential to achieve goals and functional outcome are medical prognosis.. Patient will benefit from skilled SLP services to address above impairments and improve overall function.  REHAB POTENTIAL: Good  PLAN: SLP FREQUENCY: 2x/week  SLP DURATION: 12  weeks  PLANNED INTERVENTIONS: Cueing hierachy, Cognitive reorganization, Internal/external aids, Functional tasks, SLP instruction and feedback, Compensatory strategies, and Patient/family education    Delon Bangs, M.S., CCC-SLP Speech-Language Pathologist [place] [facility] [phone] [place])  [facility] at [facility] [address] [place], [place], [address] Phone: [phone]   Fax:  [phone]

## 2024-03-18 ENCOUNTER — Ambulatory Visit: Admitting: Occupational Therapy

## 2024-03-18 ENCOUNTER — Ambulatory Visit: Admitting: Physical Therapy

## 2024-03-18 ENCOUNTER — Ambulatory Visit

## 2024-03-18 DIAGNOSIS — M6281 Muscle weakness (generalized): Secondary | ICD-10-CM

## 2024-03-18 DIAGNOSIS — R262 Difficulty in walking, not elsewhere classified: Secondary | ICD-10-CM

## 2024-03-18 DIAGNOSIS — R41841 Cognitive communication deficit: Secondary | ICD-10-CM

## 2024-03-18 DIAGNOSIS — H543 Unqualified visual loss, both eyes: Secondary | ICD-10-CM

## 2024-03-18 DIAGNOSIS — H547 Unspecified visual loss: Secondary | ICD-10-CM

## 2024-03-18 DIAGNOSIS — R278 Other lack of coordination: Secondary | ICD-10-CM

## 2024-03-18 NOTE — Therapy (Signed)
 Occupational Therapy Progress Note   Patient Name: Ashley Collins MRN: [id number] DOB:27-Mar-1961, 63 y.o., female Today's Date: 03/18/2024  PCP: Harvey Gaetana CROME, NP REFERRING PROVIDER: Harvey Gaetana CROME, NP   OT End of Session - 03/18/24 1334     Visit Number 23    Number of Visits 48    Date for Recertification  05/29/24    OT Start Time 1317    OT Stop Time 1400    OT Time Calculation (min) 43 min    Activity Tolerance Patient tolerated treatment well    Behavior During Therapy WFL for tasks assessed/performed              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   Cardiac arrhythmia    Nonspecific ST T wave changes on EKG   Chronic venous insufficiency of lower extremity    Complication of anesthesia    nausea and vomiting   DDD (degenerative disc disease), lumbosacral    Essential hypertension    Headache    History of kidney stones    Hyperlipidemia    Kidney stones    Lymphedema    Migraines    Osteoporosis    PONV (postoperative nausea and vomiting)    Right ureteral stone    Vitamin B12 deficiency    Vitamin D deficiency    Past Surgical History:  Procedure Laterality Date   AUGMENTATION MAMMAPLASTY     CESAREAN SECTION     x 4   COLONOSCOPY WITH PROPOFOL  N/A 03/31/2022   Procedure: COLONOSCOPY WITH PROPOFOL ;  Surgeon: Onita Elspeth Sharper, DO;  Location: [facility] ENDOSCOPY;  Service: Gastroenterology;  Laterality: N/A;   CYSTOSCOPY W/ RETROGRADES Bilateral 07/10/2020   Procedure: CYSTOSCOPY WITH RETROGRADE PYELOGRAM;  Surgeon: Francisca Redell BROCKS, MD;  Location: [facility] ORS;  Service: Urology;  Laterality: Bilateral;   CYSTOSCOPY/URETEROSCOPY/HOLMIUM LASER/STENT PLACEMENT     CYSTOSCOPY/URETEROSCOPY/HOLMIUM LASER/STENT PLACEMENT Bilateral 07/10/2020   Procedure: CYSTOSCOPY/URETEROSCOPY/HOLMIUM LASER/STENT PLACEMENT;  Surgeon: Francisca Redell BROCKS, MD;  Location: [facility] ORS;  Service: Urology;   Laterality: Bilateral;   CYSTOSCOPY/URETEROSCOPY/HOLMIUM LASER/STENT PLACEMENT Right 08/25/2023   Procedure: CYSTOSCOPY/URETEROSCOPY/HOLMIUM LASER;  Surgeon: Francisca Redell BROCKS, MD;  Location: [facility] ORS;  Service: Urology;  Laterality: Right;   CYSTOSCOPY/URETEROSCOPY/HOLMIUM LASER/STENT PLACEMENT Right 12/15/2023   Procedure: CYSTOSCOPY/URETEROSCOPY/HOLMIUM LASER;  Surgeon: Francisca Redell BROCKS, MD;  Location: [facility] ORS;  Service: Urology;  Laterality: Right;   EXTRACORPOREAL SHOCK WAVE LITHOTRIPSY     x 10 plus   Eye Lift     FACIAL COSMETIC SURGERY     GANGLION CYST EXCISION Right 12/21/2021   Procedure: REMOVAL GANGLION OF WRIST;  Surgeon: Kathlynn Sharper, MD;  Location: [facility] ORS;  Service: Orthopedics;  Laterality: Right;   LIPOSUCTION     TONSILLECTOMY     TRIGGER FINGER RELEASE Right 12/21/2021   Procedure: RELEASE TRIGGER FINGER/A-1 PULLEY;  Surgeon: Kathlynn Sharper, MD;  Location: [facility] ORS;  Service: Orthopedics;  Laterality: Right;   URETEROSCOPY WITH HOLMIUM LASER LITHOTRIPSY     Patient Active Problem List   Diagnosis Date Noted   Acute CVA (cerebrovascular accident) (HCC) 12/19/2023   Hypokalemia 12/19/2023   AKI (acute kidney injury) (HCC) 12/19/2023   Leukocytosis 12/19/2023   Hyperlipidemia, unspecified 12/19/2023   Essential hypertension 12/19/2023   Vision changes 12/19/2023   Lymphedema 05/22/2023   Chronic venous insufficiency 05/22/2023   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 replacement therapy 04/10/2023   Insomnia  due to medical condition 04/25/2022   GAD (generalized anxiety disorder) 02/02/2022   Other specified depressive episodes 02/02/2022   Long-term current use of benzodiazepine 02/02/2022   Migraine with aura and without status migrainosus, not intractable 11/03/2020   Arrhythmia 08/06/2020   Status migrainosus 02/25/2019   Osteoporosis 04/09/2013   Vitamin D deficiency 04/09/2013   DDD (degenerative disc disease), lumbosacral 02/08/2005   ONSET DATE:  12/18/2023  REFERRING DIAG:   THERAPY DIAG:  Low vision, both eyes  Rationale for Evaluation and Treatment: Rehabilitation  SUBJECTIVE:  SUBJECTIVE STATEMENT: Pt. reports that she rested all day yesterday after over doing it Saturday when she went through her clothing. Pt accompanied by: significant other  PERTINENT HISTORY: Pt. has Hx of stroke with onset 12/18/2023. Pt. PMHx includes: HTN, Hyperlipidemia, Kidney Stones, near syncopal event, loss of vision.  PRECAUTIONS: None  WEIGHT BEARING RESTRICTIONS:  PAIN:  Are you having pain? No  FALLS: Has patient fallen in last 6 months? Yes. Number of falls    LIVING ENVIRONMENT: Lives with: lives with their family Lives in: [place], [place] Stairs: No, inside the house, yes but does not use Has following equipment at home:   PLOF: Independent  PATIENT GOALS: To be able to see  OBJECTIVE:  Note: Objective measures were completed at Evaluation unless otherwise noted.  HAND DOMINANCE: Right  ADLs:  Eating: Drinking from straw is different, able to use utensils.  Grooming: Fatigues completing hair care.  UB Dressing: independent, if clothes are in front of her she can find clothing LB Dressing: Independent Toileting: Independent Bathing: Independent Tub Shower transfers: Independent Equipment: none Has difficulty with using cell phone  IADLs: Shopping: Does not typically go out shopping, and has not tried to. Light housekeeping: Pt. reports that she does not typically do house cleaning. Pt. has been able to unpack belongings from boxes due to her recent move in. Meal Prep: Pt. reports that is does not currently cook, however reports that she probably could. Has difficulty opening bottle caps/lids. Community mobility: Requires assistance from husband to navigate  through buildings, negotiate stairs-Pt. With increased fear of falling  Medication management: Able to push down medication bottles to open  Financial management:  No change in the process-uses automatic bill pay system.  Has difficulty with using cell phone Handwriting: N/T Work: Pt. was actively working managing a Health visitor, works a lot of hours  MOBILITY STATUS: Independent and Needs Assist: Requires hand on hand assistance with nvigating through environments.   POSTURE COMMENTS:  No Significant postural limitations Sitting balance: Good  ACTIVITY TOLERANCE: Activity tolerance: Good  FUNCTIONAL OUTCOME MEASURES:   UPPER EXTREMITY ROM:    Active ROM Right Eval WFL Left Eval [facility]  Shoulder flexion    Shoulder abduction    Shoulder adduction    Shoulder extension    Shoulder internal rotation    Shoulder external rotation    Elbow flexion    Elbow extension    Wrist flexion    Wrist extension    Wrist ulnar deviation    Wrist radial deviation    Wrist pronation    Wrist supination    (Blank rows = not tested)  UPPER EXTREMITY MMT:     MMT Right eval Right Left eval Left  Shoulder flexion 4-/5 5/5 4-/5 5/5  Shoulder abduction 4-/5 5/5 4-/5 5/5  Shoulder adduction      Shoulder extension      Shoulder internal rotation      Shoulder external  rotation      Middle trapezius      Lower trapezius      Elbow flexion 5/5 5/5 5/5 5/5  Elbow extension 5/5 5/5 5/5 5/5  Wrist flexion 4/5 5/5 4-/5 5/5  Wrist extension 4/5 5/5 4-/5 5/5  Wrist ulnar deviation      Wrist radial deviation      Wrist pronation      Wrist supination      (Blank rows = not tested)  HAND FUNCTION: Grip strength: Right: 39 lbs; Left: 28 lbs, Lateral pinch: Right: 11 lbs, Left: 9 lbs, and 3 point pinch: Right: 9 lbs, Left: 9 lbs  01/29/24 Grip strength: Right: 39 lbs; Left: 36 lbs, Lateral pinch: Right: 14 lbs, Left: 12 lbs, and 3 point pinch: Right: 12 lbs, Left: 11 lbs   COORDINATION: 9 Hole Peg test: Right: 30 sec; Left: 36 sec  01/29/24 9 Hole Peg test: Right: 24 sec; Left: 23 sec   SENSATION: WFL  EDEMA:   MUSCLE TONE:    COGNITION: Overall cognitive status: Within functional limits for tasks assessed  VISION: Subjective report: Pt. reports changes in her vision at onset of stroke, starting with blurry vision, resulting in vision loss in the R eye.  Baseline vision: Pt. Is able to visually track  Visual history: Hx of eye surgery  VISION ASSESSMENT: TBD  PERCEPTION: WFL  PRAXIS: WFL  OBSERVATIONS:                                                                                                                    TREATMENT DATE: 03/18/24   Self-Care:   -Pt. worked on visual scanning through complex bills answering a detailed set of questions about the bill.  -Pt. worked on visually scanning through dollar cost amounts for specified items. -Pt. worked on visually scanning through moderately complex, and complex detailed schedules. -Pt. worked on answering specific detailed questions about the schedules located on the back of the schedule form page working on dividing her attention and visual memory skills.  HOME EXERCISE PROGRAM:  -Visual memory tasks -Writing tasks -visual motor tasks.  GOALS: Goals reviewed with patient? Yes  SHORT TERM GOALS: Target date: 04/17/2024    Pt. Will independently utilize HEP for hand strength, coordination, and visual compensatory strategies ADL/ADLs Baseline: 03/06/24: Independent Eval: No  current HEP  Goal status: Ongoing   LONG TERM GOALS: Target date: 05/29/2024   Pt. Will be able to independently implement visual scanning/visual search compensatory strategies for tasks within her extra personal space navigating through community environments 100% of the time.  Baseline: 01/29/24:  Independent  100% of the time within the therapy gym, and hallway. Eval: Pt. Requires increased assist to navigate through community environments. Goal status: Achieved  2.  Pt. Will be able to independently utilize visual scanning/visual search strategies  during ADLS/IADL  within her near space, and during tabletop top tasks. 100% of the time.  Baseline: 03/06/24: Initiates visual scanning 85% of the time  01/29/24:  75% Eval: Pt. Education to be provided.  Goal status: INITIAL  3.  Pt. Will be able to independently initiate visual scanning techniques in her own environment to reduce risk of falls.  Baseline: 01/29/24: Education was provided, Pt. Is utlizing visual compensatory strategies/visual scanning with in her home. Eval: Pt. Education to be provided.  Goal status: Achieved  4.  Pt. Will increase BUE Grip Strength by 5# to be able to independently hold objects for ADL/IADL use. Baseline: 01/29/24: Grip strength: Right: 39 lbs; Left: 28 lbs, Eval: Right Grip: 39#, Left Grip: 28# Goal status: Achieved  5.  Pt. Will increase L Lateral Pinch strength by 2# to be able to independently twist off bottle caps/lids. Baseline: 01/29/24: Lateral pinch: Right: 14 lbs, Left: 12 lbs Eval: Lateral pinch: Right: 11 lbs, Left: 9 lbs  Goal status: Achieved  6.  Pt. Will increase BUE strength for shoulder flexion/abduction by 2 mm grades to independently complete hair care tasks. Baseline: 01/29/24: 5/5 overall Eval: Right Shoulder Flexion: 4-/5, Left Shoulder Flexion:4-/5, Right Shoulder Abduction: 4-/5, Left Shoulder Abduction: 4-/5 Goal status: Achieved  7.  Pt will be indep with medication set up using weekly pill organizer.  Baseline: 01/29/24: Independent 01/15/24: Daughter currently manages medication set up.  Goal status: Achieved  8. Pt will increase typing speed to 20-30 words per minute with at least 90% accuracy to work towards more efficient typing for job related  responsibilities.  Baseline: 03/06/24: 10 wpm with 92% accuracy. 01/29/24: Continue 01/15/24: 8wpm x88% accuracy=7 wpm  Goal status: Progressing/Ongoing  9. Pt will be able to scan small print on store receipts to check for errors with 100% accuracy using visual compensation strategies as  needed.  Baseline: 03/06/24: Pt. Continues to present with difficulty with visual scanning small  small items efficiently. 01/29/24: Pt. Continues to have difficulty scanning small print items. 01/15/24: Not yet attempted; required for return to work/job responsibility  Goal status:  Ongoing  10. Pt. Will independently identify 8 items consistently from visual visual memory in preparation or ADLs.    Baseline: 03/06/24: Pt. Is able to identify up to 6 picture items while seated at the tabletop. Pt. is able to consistently Identify 4 letters from visual memory when attention is divided.  01/29/24: Pt. is able to identify 6 items consistently from visual memory.    Gaol status: Ongoing      11. Pt. will be independently write 4 sentences  efficiently with 100% legibility, no deviation from writing on a blank line, and appropriate spacing between letters.              Baseline: 03/06/24: 4 lines with 75% legibility, positive deviation below the line, and excessive spacing between the words.              Goal status: Ongoing  ASSESSMENT: CLINICAL IMPRESSION:  Pt. reports being very fatigued yesterday after having gone through her clothing on Saturday. Pt. was able to efficiently and accurately visually scan through a complex monthly bill. Pt. presented with increased difficulty dividing her attention, visually scanning, and visual memory tasks when flipping between the front and back of the schedule page requiring increased time to acclimate to each side of the page when answering and responding to each of the questions. Pt. continues to benefit from Occupational Therapy services to improve her ability to use visual compensatory strategies, and improve overall BUE functioning in order to improve engagement in, and maximize overall independence with ADL, and IADL tasks.  PERFORMANCE DEFICITS: in functional skills including ADLs, IADLs, coordination, dexterity, Fine motor control, and vision, and psychosocial  skills including coping strategies, environmental adaptation, habits, interpersonal interactions, and routines and behaviors.   IMPAIRMENTS: are limiting patient from ADLs, IADLs, rest and sleep, work, leisure, and social participation.   CO-MORBIDITIES: may have co-morbidities  that affects occupational performance. Patient will benefit from skilled OT to address above impairments and improve overall function.  MODIFICATION OR ASSISTANCE TO COMPLETE EVALUATION: Min-Moderate modification of tasks or assist with assess necessary to complete an evaluation.  OT OCCUPATIONAL PROFILE AND HISTORY: Detailed assessment: Review of records and additional review of physical, cognitive, psychosocial history related to current functional performance.  CLINICAL DECISION MAKING: Moderate - several treatment options, min-mod task modification necessary  REHAB POTENTIAL: Good  EVALUATION COMPLEXITY: Moderate    PLAN:  OT FREQUENCY: 2x/week  OT DURATION: 12 weeks  PLANNED INTERVENTIONS: 97168 OT Re-evaluation, 97535 self care/ADL training, 02889 therapeutic exercise, 97530 therapeutic activity, 97112 neuromuscular re-education, visual/perceptual remediation/compensation, patient/family education, and DME and/or AE instructions  RECOMMENDED OTHER SERVICES: ST  CONSULTED AND AGREED WITH PLAN OF CARE: Patient and family member/caregiver  PLAN FOR NEXT SESSION: Treatment  Richardson Otter, MS, OTR/L   03/18/24, 1:36 PM

## 2024-03-18 NOTE — Therapy (Signed)
 OUTPATIENT SPEECH LANGUAGE PATHOLOGY  TREATMENT   Patient Name: Ashley Collins MRN: [id number] DOB:13-Aug-1960, 63 y.o., female Today's Date: 03/18/2024  PCP: Gaetana Haddock, NP  REFERRING PROVIDER: same   End of Session - 03/18/24 1351     Visit Number 7    Number of Visits 24    Date for Recertification  05/14/24    SLP Start Time 1400    SLP Stop Time  1445    SLP Time Calculation (min) 45 min    Activity Tolerance Patient tolerated treatment well           Patient Active Problem List   Diagnosis Date Noted   Acute CVA (cerebrovascular accident) (HCC) 12/19/2023   Hypokalemia 12/19/2023   AKI (acute kidney injury) (HCC) 12/19/2023   Leukocytosis 12/19/2023   Hyperlipidemia, unspecified 12/19/2023   Essential hypertension 12/19/2023   Vision changes 12/19/2023   Lymphedema 05/22/2023   Chronic venous insufficiency 05/22/2023   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 replacement therapy 04/10/2023   Insomnia due to medical condition 04/25/2022   GAD (generalized anxiety disorder) 02/02/2022   Other specified depressive episodes 02/02/2022   Long-term current use of benzodiazepine 02/02/2022   Migraine with aura and without status migrainosus, not intractable 11/03/2020   Arrhythmia 08/06/2020   Status migrainosus 02/25/2019   Osteoporosis 04/09/2013   Vitamin D deficiency 04/09/2013   DDD (degenerative disc disease), lumbosacral 02/08/2005    ONSET DATE: 12/19/23   REFERRING DIAG: CVA- memory deficits  THERAPY DIAG:  Cognitive communication deficit  Rationale for Evaluation and Treatment Rehabilitation  SUBJECTIVE:   SUBJECTIVE STATEMENT: Pt alert, pleasant, and cooperative. Pt accompanied by: self and significant other  PERTINENT HISTORY & DIAGNOSTIC FINDINGS: Pt is 63 y.o. female who presents today for a cognitive-communication evaluation in setting of stroke. MRI 12/18/23 1. 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 No  associated hemorrhage or mass effect. PMHx as outlined above.  PAIN:  Are you having pain? No   FALLS: Has patient fallen in last 6 months?  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   PATIENT GOALS  to return to PLOF   OBJECTIVE:  TODAY'S TREATMENT: Reviewed concept of use of journaling to improve short term and prospective memory. Pt has been journaling to improve short term and prospective memory. Reviewed attention strategies such as telling self to mentally focus and to narrate actions aloud (e.g. when cooking, placing items down, or while going to get something from another room). Pt endorsed utilizing at home with success. Reviewed modified Pomodoro method with pt taking regularly schedule rest breaks. Pt to schedule day utilizing calendar as if she was at work with consideration for priority tasks and building in rest breaks. Reviewed types of memory and external memory strategies. Pt has been using calendar on iPhone. Pt demonstrated use of internal memory strategies to code and recall x5 names for pictured faces immediately and after 35 delay with extra time. Education and supportive counseling provided re: pt's concerns for returning to work, adaptations for success, and reducing cognitive load/managing stress.  PATIENT EDUCATION: Education details: as above Person educated: Patient and Spouse Education method: Explanation; Handout Education comprehension: verbalized understanding  HOME EXERCISE PROGRAM:        Practice narrating actions  Telling self to mentally focus  Continue  calendar use, blocking schedule, and journaling     GOALS:  Goals reviewed with patient? Yes  SHORT TERM GOALS: Target date: 10 sessions  Pt will complete PROM re:  memory.  Baseline: Goal status: MET   2.  Pt will endorse successful implementation of at least x2 compensations for attention and memory.  Baseline:  Goal status: INITIAL  3.  With Moderate A, patient will establish external aid for memory/executive function and bring to more than 50% of therapy sessions.    Baseline:  Goal status: INITIAL    LONG TERM GOALS: Target date: 12 weeks  Pt will endorse improvement in cognitive-communication per PROM.  Baseline:  Goal status: INITIAL  2.  Pt and/or husband will demonstrate understanding of ways to promote and support cognitive-communication outside of SLP sessions.  Baseline:  Goal status: INITIAL   ASSESSMENT:  CLINICAL IMPRESSION:  Pt is 63 y.o. female who presents today for a cognitive-communication treatment in setting of stroke. Initial assessment completed via formal means (Cognitive-Linguistic Quick Test) and PROM (Neuro-QoL Adult Cognitive Function v2.0). Pt presents with at least mild cognitive-communication deficits affecting attention and visuospatial skills per CLQT as well as memory and executive functioning per PROM. Suspect CLQT is not as sensitive to higher level cognitive-communication deficits appreciated by pt during iADLs. See details of today's tx as outlined above. Recommend skilled ST services targeting above mentioned deficits to improve QoL and performance on ADLs/IADLs.  OBJECTIVE IMPAIRMENTS include attention, memory, executive functioning, and visuospatial deficits. These impairments are limiting patient from return to work, managing appointments, household responsibilities, and ADLs/IADLs. Factors affecting potential to achieve goals and functional outcome are medical prognosis.. Patient will benefit from skilled SLP services to address above impairments and improve overall function.  REHAB POTENTIAL: Good  PLAN: SLP FREQUENCY: 2x/week  SLP DURATION: 12 weeks  PLANNED INTERVENTIONS: Cueing hierachy,  Cognitive reorganization, Internal/external aids, Functional tasks, SLP instruction and feedback, Compensatory strategies, and Patient/family education    Delon Bangs, M.S., CCC-SLP Speech-Language Pathologist [facility] [phone] [place])  [facility] at [facility] [address] [place], [place], [address] Phone: [phone]   Fax:  [phone]

## 2024-03-18 NOTE — Therapy (Unsigned)
 OUTPATIENT PHYSICAL THERAPY TREATMENT/ Discharge Assessment.   Patient Name: Ashley Collins MRN: [id number] DOB:Jun 30, 1960, 63 y.o., female Today's Date: 03/18/2024  PCP: Harvey Gaetana CROME, NP REFERRING PROVIDER: Harvey Gaetana CROME, NP, Prentice Reges, DO   END OF SESSION:  PT End of Session - 03/18/24 1445     Visit Number 7    Number of Visits 7   Date for Recertification  03/19/24    Authorization Type [employer] HMO/PPO    Authorization Time Period 02/20/24-03/19/24    Progress Note Due on Visit 10    Activity Tolerance Patient tolerated treatment well;No increased pain    Behavior During Therapy WFL for tasks assessed/performed          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   Cardiac arrhythmia    Nonspecific ST T wave changes on EKG   Chronic venous insufficiency of lower extremity    Complication of anesthesia    nausea and vomiting   DDD (degenerative disc disease), lumbosacral    Essential hypertension    Headache    History of kidney stones    Hyperlipidemia    Kidney stones    Lymphedema    Migraines    Osteoporosis    PONV (postoperative nausea and vomiting)    Right ureteral stone    Vitamin B12 deficiency    Vitamin D deficiency    Past Surgical History:  Procedure Laterality Date   AUGMENTATION MAMMAPLASTY     CESAREAN SECTION     x 4   COLONOSCOPY WITH PROPOFOL  N/A 03/31/2022   Procedure: COLONOSCOPY WITH PROPOFOL ;  Surgeon: Onita Elspeth Sharper, DO;  Location: [facility];  Service: Gastroenterology;  Laterality: N/A;   CYSTOSCOPY W/ RETROGRADES Bilateral 07/10/2020   Procedure: CYSTOSCOPY WITH RETROGRADE PYELOGRAM;  Surgeon: Francisca Redell BROCKS, MD;  Location: [facility];  Service: Urology;  Laterality: Bilateral;   CYSTOSCOPY/URETEROSCOPY/HOLMIUM LASER/STENT PLACEMENT     CYSTOSCOPY/URETEROSCOPY/HOLMIUM LASER/STENT PLACEMENT Bilateral 07/10/2020   Procedure:  CYSTOSCOPY/URETEROSCOPY/HOLMIUM LASER/STENT PLACEMENT;  Surgeon: Francisca Redell BROCKS, MD;  Location: [facility];  Service: Urology;  Laterality: Bilateral;   CYSTOSCOPY/URETEROSCOPY/HOLMIUM LASER/STENT PLACEMENT Right 08/25/2023   Procedure: CYSTOSCOPY/URETEROSCOPY/HOLMIUM LASER;  Surgeon: Francisca Redell BROCKS, MD;  Location: [facility];  Service: Urology;  Laterality: Right;   CYSTOSCOPY/URETEROSCOPY/HOLMIUM LASER/STENT PLACEMENT Right 12/15/2023   Procedure: CYSTOSCOPY/URETEROSCOPY/HOLMIUM LASER;  Surgeon: Francisca Redell BROCKS, MD;  Location: [facility];  Service: Urology;  Laterality: Right;   EXTRACORPOREAL SHOCK WAVE LITHOTRIPSY     x 10 plus   Eye Lift     FACIAL COSMETIC SURGERY     GANGLION CYST EXCISION Right 12/21/2021   Procedure: REMOVAL GANGLION OF WRIST;  Surgeon: Kathlynn Sharper, MD;  Location: [facility];  Service: Orthopedics;  Laterality: Right;   LIPOSUCTION     TONSILLECTOMY     TRIGGER FINGER RELEASE Right 12/21/2021   Procedure: RELEASE TRIGGER FINGER/A-1 PULLEY;  Surgeon: Kathlynn Sharper, MD;  Location: [facility];  Service: Orthopedics;  Laterality: Right;   URETEROSCOPY WITH HOLMIUM LASER LITHOTRIPSY     Patient Active Problem List   Diagnosis Date Noted   Acute CVA (cerebrovascular accident) (HCC) 12/19/2023   Hypokalemia 12/19/2023   AKI (acute kidney injury) (HCC) 12/19/2023   Leukocytosis 12/19/2023   Hyperlipidemia, unspecified 12/19/2023   Essential hypertension 12/19/2023   Vision changes 12/19/2023   Lymphedema 05/22/2023   Chronic venous insufficiency 05/22/2023   Menopausal syndrome on  hormone replacement therapy 04/10/2023   Insomnia due to medical condition 04/25/2022   GAD (generalized anxiety disorder) 02/02/2022   Other specified depressive episodes 02/02/2022   Long-term current use of benzodiazepine 02/02/2022   Migraine with aura and without status migrainosus, not intractable 11/03/2020   Arrhythmia 08/06/2020   Status migrainosus 02/25/2019   Osteoporosis  04/09/2013   Vitamin D deficiency 04/09/2013   DDD (degenerative disc disease), lumbosacral 02/08/2005   ONSET DATE: July 2025 REFERRING DIAG: s/p CVA; chronic Left knee pain   THERAPY DIAG:  Other lack of coordination  Difficulty in walking, not elsewhere classified  Visual impairment  Muscle weakness (generalized)  Rationale for Evaluation and Treatment: Rehabilitation  SUBJECTIVE:                                                                                                                                                                                             SUBJECTIVE STATEMENT:   - Pt doing well. States that she has had knee pain for roughly a year. States that she received MRI last week and was found to have L knee torn meniscus. While be getting orthopedic consult later this week.   - reports that she was able to walk around the mall for 20 min last week as well as going shopping and find objects in store with specific targets with only a little difficulty. Still feels like scanning is harder.   -   Pt accompanied by: significant other  PERTINENT HISTORY:   63yoF who sustained a CVA in July 2025 with visual field loss and memory impairment, now followed by [facility] neurology and [facility]. Pt has been working with OT here since July, OT noted some episodic imbalance and mild cognitive impairment hence recommended referral to OPPT and OPST. PCP sent referrals for these 3rd week of August. Pt also seen by [facility] sports medicine for chronic Left knee pain s/p remote menisectomy, MRI results pending, also sent a referral to PT for Left knee pain. Pt has memory difficulty since CVA which makes it difficult for her to specific particular deficits or difficulty with mobility since CVA, however OT has seen some episodic stumbling and LOB after bending forward to reach. Pt reports limited confidence in balance overall when walking out of house, will hold onto husband, is startled  by new visual input in crowded spaces pertaining to visual deficits. 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   PAIN:  Are you having pain? No PRECAUTIONS: None  WEIGHT BEARING RESTRICTIONS: No  FALLS: Has patient fallen in last 6 months? 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   PATIENT GOALS: Return to work, full confident mobility/activity.   OBJECTIVE:  Note: Objective measures were completed at Evaluation unless otherwise noted.                                                                                                                             TREATMENT DATE 03/18/24:    10 Meter Walk Test: Patient instructed to walk 10 meters (32.8 ft) as quickly and as safely as possible at their normal speed x2 and at a fast speed x2. Time measured from 2 meter mark to 8 meter mark to accommodate ramp-up and ramp-down.  Normal speed 1: 9.8 sec  Normal speed 2: 9.6sec  Average Normal speed: 1.03 m/s Fast speed 1: 8.7sec Fast speed 2: 8.4sec Average Fast speed: 1.16 m/s Cut off scores: <0.4 m/s = household Ambulator, 0.4-0.8 m/s = limited community Ambulator, >0.8 m/s = community Ambulator, >1.2 m/s = crossing a street, <1.0 = increased fall risk MCID 0.05 m/s (small), 0.13 m/s (moderate), 0.06 m/s (significant)  (ANPTA Core Set of Outcome Measures for Adults with Neurologic Conditions, 2018)   PT instructed pt in TUG: 9.65 sec (average of 3 trials; >13.5 sec indicates increased fall risk) PT instructed pt in Cognitive TUG: 10.21 sec (average of 3 trials; >13.5 sec indicates increased fall risk)   MiniBEST Assesment- Balance Evaluation Systems Test  [facility] PT Assessment - 03/18/24 0001        Mini-BESTest   Sit To Stand Normal: Comes to stand without use of hands and stabilizes independently.    Rise to Toes Normal: Stable for 3 s with maximum height.    Stand on one leg (left) Normal: 20 s.    Stand on one leg (right) Normal: 20 s.    Stand on one leg - lowest score 2    Compensatory Stepping Correction - Forward Normal: Recovers independently with a single, large step (second realignement is allowed).    Compensatory Stepping Correction - Backward Moderate: More than one step is required to recover equilibrium    Compensatory Stepping Correction - Left Lateral Normal: Recovers independently with 1 step (crossover or lateral OK)    Compensatory Stepping Correction - Right Lateral Normal: Recovers independently with 1 step (crossover or lateral OK)    Stepping Corredtion Lateral - lowest score 2    Stance - Feet together, eyes open, firm surface  Normal: 30s    Stance - Feet together, eyes closed, foam surface  Normal: 30s    Incline - Eyes Closed Normal: Stands independently 30s and aligns with gravity    Change in Gait Speed Normal: Significantly changes walkling speed without imbalance  Walk with head turns - Horizontal Normal: performs head turns with no change in gait speed and good balance    Walk with pivot turns Normal: Turns with feet close FAST (< 3 steps) with good balance.    Step over obstacles Normal: Able to step over box with minimal change of gait speed and with good balance.    Timed UP & GO with Dual Task Normal: No noticeable change in sitting, standing or walking while backward counting when compared to TUG without    Mini-BEST total score 27          SCORE 27/28  PT instructed pt in gait through patio and handicap ramp as well as and through grass without assist >1055ft total. No LOB on significantly unlevel surface.    PATIENT EDUCATION: Education details: findings for balance deficits through minibest and how it relates to functional balance at  work.  encouraged to initiate walking program on treadmill.  Person educated: Patient, husband Education method: collaborative learning, deliberate practice, positive reinforcement, explicit instruction, establish rules. Education comprehension: verbalized understanding  HOME EXERCISE PROGRAM: Repeat exposure to public spaces up to 1 hours outings to practice spatial navigation, awareness, and visual reintegration. Visual scanning of traffic from passenger seat of car for visual reintegration.   GOALS: Goals reviewed with patient? No  SHORT TERM GOALS: Target date: 02/25/24  Pt to report understanding of importance of progressive reintegration into community outings with progressive increase in length and complexity.  Baseline: verbalized understanding  Goal status: INITIAL  2.  Pt to reports ability to navigate simple, uncrowded public spaces at supervision level without use of SO for support and with use of railings prn.  Baseline: able to navigate store with UE support on Shopping cart. Still using husband for arm support in parking lots.  Goal status: In progress.   3.  Pt to report participation in regular long distance walking with SO 3x/week starting at 25 minutes per walk or greater.  Baseline: reports that she is walking in the mall at least 2 days a week Goal status: In progress   4. Pt will increase Minibest by > 4 points to indicate improved safety with community mobility and reduced fall risk.  Baseline: 18 9/22: 27 Goal status: MET   ASSESSMENT:  CLINICAL IMPRESSION: PT instructed pt in d/c assessment. Was noted to increase MiniBEST to 27/28, but is still slightly self limiting for community mobility and use of community training options, requesting SO assist with gait in parking lots and limiting time in walking trials to <20 min. Pt was noted to have no LOB on very unlevel simulated community ambulation through grass.  Due to improved balance and safety with mobility,  pt will no longer require PT services.    OBJECTIVE IMPAIRMENTS: decreased activity tolerance, decreased cognition, decreased endurance, difficulty walking, decreased safety awareness, and impaired perceived functional ability.   ACTIVITY LIMITATIONS: bending and stairs  PARTICIPATION LIMITATIONS: medication management, interpersonal relationship, driving, shopping, community activity, and occupation  PERSONAL FACTORS: Profession and Time since onset of injury/illness/exacerbation are also affecting patient's functional outcome.   REHAB POTENTIAL: Poor  CLINICAL DECISION MAKING: Evolving/moderate complexity  EVALUATION COMPLEXITY: High  PLAN:  PT FREQUENCY: 1x/week  PT DURATION: 1 week  PLANNED INTERVENTIONS: 97750- Physical Performance Testing, 97110-Therapeutic exercises, 97530- Therapeutic activity, [id number]- Neuromuscular re-education, 97535- Self Care, [id number]- Manual therapy, [phone]- Gait training, Patient/Family education, Balance training, Stair training, and Cognitive remediation  PLAN FOR NEXT SESSION:  N/A due to d/c  Massie Dollar PT, DPT  Physical Therapist - [facility]  7:01 AM 03/20/24

## 2024-03-19 NOTE — Progress Notes (Signed)
 History of Present Illness:   Ashley Collins is a 63 y.o. female here for   Verbally consented to the use of AI for note-taking.   Chief Complaint  Patient presents with  . Follow-up    History of Present Illness Ashley Collins is a 63 year old female who presents for follow-up   She has been attending physical therapy and engaging in new activities, which she believes may have caused some irritation. She experiences occasional headaches, with the last one occurring yesterday. She is scheduled for Botox treatment next month and is awaiting approval for Ajovy injections. She has been prescribed an additional medication for headaches, which she used for the first time yesterday after nearly three months without needing it.  Her blood pressure readings have improved since obtaining a new blood pressure machine. Previously, her readings were in the 160s and 140s, but with the new machine, they are around 109 to 112. She takes half a pill of olmesartan at 5 PM to avoid daytime brain fog, which she reports has improved since changing the timing of her medication.  She experiences brain fog, which she attributes to her blood pressure medication. She has been working on improving her short-term memory through therapy exercises. Her long-term memory is intact, but she struggles with remembering new tasks. Her vision has improved with the use of glasses, and she has been practicing visual scanning and reading. She reports progress in her ability to write and manage tasks such as balancing a checkbook.  She reports pelvic pressure and frequent nighttime urination, suspecting a possible UTI. No back pain radiating to the front; the patient reports the pressure is just pelvic at this point.  She engages in regular physical therapy and rates her diet as healthy. She manages her own finances and medications, performs some housework and cooking, and rates her overall health as fair. She has not been shopping alone  recently but has done so in the past.     03/19/2024  HRA  _   HEALTH STATUS   Have you been hospitalized recently? Yes*  Do you exercise? Yes  How often do you exercise? 3-5 days  What type of exercise? physical therapy, walking  How would you rate your diet? Healthy  __   DIFFICULTY WITH   Bathing No  Dressing No  Toileting No  Transferring from bed, chair, etc. No  Bowel or Bladder control No  Feeding No  ___   HOME   Rugs in the hallway? No  Grab bars in the bathroom? No*  Stairs inside the home? Yes*  Handrails on the stairs? Yes  Poor lighting? No  _____   HEARING   Do you have trouble hearing the television when others do not? No  Do you have to strain to hear conversations? No  ______   ADVANCED DIRECTIVE   Do you have an advance directive? (Living Will and/or Healthcare Power of Attorney) No*  If not, are you interested in receiving information about setting one up? No  _______   HRA QUESTIONS   Do you have problems with your memory? Yes*  In the past 4 weeks, was someone available to help you if you were having difficulties? Yes  Are you able to get to the places you need to go by driving or using public transportation? Yes  Do you always wear your seatbelt? Yes  Are you able to do your shopping independently? Yes  Are you able to manage your own finances? Yes  Are you able to manage your own medications (knowing what medications are for and remembering to take them)? Yes  Are you able to use the telephone without difficulty? Yes  Are you able to do your housework and cooking independently? Yes  Do you have problems with your teeth or dentures? No  Do you take Opioid medication? No  How would you rate your overall health? Fair      Past Medical History:   Past Medical History:  Diagnosis Date  . Anxiety   . Complication of anesthesia   . Depression   . Hypertension   . Kidney stone   . Osteoporosis   . Sleep apnea     Past Surgical History:    Past Surgical History:  Procedure Laterality Date  . CYSTOSCOPY W/ RETROGRADES  06/30/2020  . CYSTOSCOPY/URETEROSCOPY/HOLMIUM LASER/STENT PLACEMENT  07/10/2020  . REMOVAL GANGLION OF WRIST (Right) RELEASE TRIGGER FINGER/A-1 PULLEY (Right)  Right 12/21/2021   Dr. Kathlynn  . Colon @ [facility]  03/31/2022   Sessile serrated polyp/Hyperplastic polyps/Repeat 42yrs/SMR  . CATARACT EXTRACTION    . CESAREAN SECTION     x 4  . CYSTOSCOPY/URETEROSCOPY/HOLMIUM LASER/STENT PLACEMENT    . Extracorporeal Shock Wave Lithotripsy     X 10 plus  . Eye Lift    . FACIAL COSMETIC SURGERY    . PLACEMENT OF BREAST IMPLANTS    . TONSILLECTOMY    . URETEROSCOPY WITH HOLMIUM LASER LITHOTRIPSY      Allergies:  No Known Allergies  Current Medications:   Prior to Admission medications  Medication Sig Taking? Last Dose  amitriptyline  (ELAVIL ) 75 MG tablet Take 1 tablet (75 mg total) by mouth at bedtime Yes Taking  aspirin  81 MG EC tablet Take 81 mg by mouth once daily Yes Taking  atorvastatin  (LIPITOR) 40 MG tablet Take 1 tablet (40 mg total) by mouth once daily for 90 days Yes Taking  biotin 10,000 mcg Cap Take by mouth Yes Taking  ergocalciferol, vitamin D2, 1,250 mcg (50,000 unit) capsule Take 1 capsule (50,000 Units total) by mouth once a week for 30 days Yes   fremanezumab-vfrm (AJOVY AUTOINJECTOR) 225 mg/1.5 mL AtIn Inject 1.5 mLs subcutaneously monthly Yes Taking  LORazepam  (ATIVAN ) 0.5 MG tablet  Yes Taking  methocarbamoL (ROBAXIN) 500 MG tablet 1/2-1 po bid prn Yes Taking  olmesartan (BENICAR) 20 MG tablet Take 0.5 tablet daily, may increase to 1 tablet Yes Taking  onabotulinumtoxinA, cosmetic, (BOTOX) 50 unit injection Inject 1 Dose into the muscle Yes Taking  zolpidem  (AMBIEN ) 10 mg tablet Take 10 mg by mouth at bedtime as needed for Sleep Yes Taking    Family History:   Family History  Problem Relation Name Age of Onset  . Lung cancer Mother suzanne greenberg   . Cancer Mother suzanne  greenberg   . Osteoporosis (Thinning of bones) Mother suzanne greenberg   . Bone cancer Father    . Myocardial Infarction (Heart attack) Father    . No Known Problems Sister 23   . No Known Problems Sister 9   . Myocardial Infarction (Heart attack) Brother 60   . Anxiety Daughter Ileana     Social History:   Social History   Socioeconomic History  . Marital status: Married  Tobacco Use  . Smoking status: Former  . Smokeless tobacco: Never  . Tobacco comments:    smoked as a teenager for about a year  Vaping Use  . Vaping status: Never Used  Substance and Sexual Activity  .  Alcohol use: Not Currently    Comment: Occasionally  . Drug use: Not Currently    Comment: cocaine  . Sexual activity: Yes    Partners: Male    Birth control/protection: Post-menopausal  Other Topics Concern  . Would you please tell us  about the people who live in your home, your pets, or anything else important to your social life? No   Social Drivers of Corporate investment banker Strain: Low Risk  (03/19/2024)   Overall Financial Resource Strain (CARDIA)   . Difficulty of Paying Living Expenses: Not hard at all  Food Insecurity: No Food Insecurity (03/19/2024)   Hunger Vital Sign   . Worried About Programme researcher, broadcasting/film/video in the Last Year: Never true   . Ran Out of Food in the Last Year: Never true  Transportation Needs: No Transportation Needs (03/19/2024)   PRAPARE - Transportation   . Lack of Transportation (Medical): No   . Lack of Transportation (Non-Medical): No  Social Connections: Moderately Integrated (12/19/2023)   Received from [facility]   Social Connection and Isolation Panel   . In a typical week, how many times do you talk on the phone with family, friends, or neighbors?: More than three times a week   . How often do you get together with friends or relatives?: Once a week   . How often do you attend church or religious services?: Never   . Do you belong to any clubs or organizations  such as church groups, unions, fraternal or athletic groups, or school groups?: Yes   . How often do you attend meetings of the clubs or organizations you belong to?: More than 4 times per year   . Are you married, widowed, divorced, separated, never married, or living with a partner?: Married  Housing Stability: Low Risk  (03/19/2024)   Housing Stability Vital Sign   . Unable to Pay for Housing in the Last Year: No   . Number of Times Moved in the Last Year: 0   . Homeless in the Last Year: No    Review of Systems:   A 10 point review of systems is negative, except for the pertinent positives and negatives detailed in the HPI.  Vitals:   Vitals:   03/19/24 1112  BP: 138/74  Pulse: 93  SpO2: 99%  Weight: 53.9 kg (118 lb 12.8 oz)  Height: 157.5 cm (5' 2.01)     Body mass index is 21.72 kg/m.  Physical Exam:   Physical Exam Vitals and nursing note reviewed.  Constitutional:      General: She is not in acute distress.    Appearance: Normal appearance. She is not ill-appearing, toxic-appearing or diaphoretic.  HENT:     Head: Normocephalic and atraumatic.     Right Ear: External ear normal.     Left Ear: External ear normal.  Eyes:     General:        Right eye: No discharge.        Left eye: No discharge.     Conjunctiva/sclera: Conjunctivae normal.  Cardiovascular:     Rate and Rhythm: Normal rate and regular rhythm.     Pulses: Normal pulses.     Heart sounds: Normal heart sounds. No murmur heard.    No friction rub. No gallop.  Pulmonary:     Effort: Pulmonary effort is normal. No respiratory distress.     Breath sounds: Normal breath sounds. No stridor. No wheezing, rhonchi or rales.  Chest:  Chest wall: No tenderness.  Skin:    General: Skin is warm and dry.     Capillary Refill: Capillary refill takes less than 2 seconds.  Neurological:     Mental Status: She is alert.     Assessment and Plan:  No results found for this visit on  03/19/24.  Diagnoses and all orders for this visit:  Medicare annual wellness visit, subsequent  Dysuria -     Urinalysis w/Microscopic -     Urine Culture, Routine - [facility]  Hypertension, essential -     CBC w/auto Differential (5 Part) -     Comprehensive Metabolic Panel (CMP) -     Lipid Panel w/calc LDL -     CBC w/auto Differential (5 Part); Future -     Comprehensive Metabolic Panel (CMP); Future -     Hemoglobin A1C; Future -     Lipid Panel w/calc LDL; Future -     Thyroid  Stimulating-Hormone (TSH); Future -     Vitamin D, 25-Hydroxy - [facility]; Future -     Vitamin B12; Future  Chronic venous insufficiency -     CBC w/auto Differential (5 Part) -     Comprehensive Metabolic Panel (CMP) -     Lipid Panel w/calc LDL  Mixed hyperlipidemia -     CBC w/auto Differential (5 Part) -     Comprehensive Metabolic Panel (CMP) -     Lipid Panel w/calc LDL -     CBC w/auto Differential (5 Part); Future -     Comprehensive Metabolic Panel (CMP); Future -     Hemoglobin A1C; Future -     Lipid Panel w/calc LDL; Future -     Thyroid  Stimulating-Hormone (TSH); Future -     Vitamin D, 25-Hydroxy - [facility]; Future -     Vitamin B12; Future  Vitamin D deficiency -     CBC w/auto Differential (5 Part) -     Comprehensive Metabolic Panel (CMP) -     Lipid Panel w/calc LDL -     Vitamin D, 25-Hydroxy - [facility] -     CBC w/auto Differential (5 Part); Future -     Comprehensive Metabolic Panel (CMP); Future -     Hemoglobin A1C; Future -     Lipid Panel w/calc LDL; Future -     Thyroid  Stimulating-Hormone (TSH); Future -     Vitamin D, 25-Hydroxy - [facility]; Future -     Vitamin B12; Future  Acute CVA (cerebrovascular accident) (CMS/HHS-HCC)  Vitamin B12 deficiency -     Vitamin B12 -     CBC w/auto Differential (5 Part); Future -     Comprehensive Metabolic Panel (CMP); Future -     Hemoglobin A1C; Future -     Lipid Panel w/calc LDL; Future -     Thyroid   Stimulating-Hormone (TSH); Future -     Vitamin D, 25-Hydroxy - [facility]; Future -     Vitamin B12; Future  Annual physical exam -     CBC w/auto Differential (5 Part); Future -     Comprehensive Metabolic Panel (CMP); Future -     Hemoglobin A1C; Future -     Lipid Panel w/calc LDL; Future -     Thyroid  Stimulating-Hormone (TSH); Future -     Vitamin D, 25-Hydroxy - [facility]; Future -     Vitamin B12; Future  Abnormal finding of blood chemistry, unspecified -     Hemoglobin A1C; Future  Other orders -  ergocalciferol, vitamin D2, 1,250 mcg (50,000 unit) capsule; Take 1 capsule (50,000 Units total) by mouth once a week for 30 days    Assessment & Plan Urinary symptoms with pelvic pressure, rule out urinary tract infection Reports pelvic pressure and frequent urination, raising suspicion for a urinary tract infection. - Perform urinalysis to rule out urinary tract infection  Migraine Reports a significant reduction in migraine frequency, with only one headache in the past three months. Receiving Botox injections every three months and awaiting approval for Ajovy injectable. Under the care of a migraine specialist at [facility]. 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 Undergoing therapy to improve memory and cognitive function, including exercises for short-term memory retention. - Continue cognitive therapy exercises  Hypertension Blood pressure readings have improved with the new blood pressure machine, showing values around 109-112 mmHg. Olmesartan is effectively managing blood pressure. She prefers to take medication in the evening to avoid daytime brain fog. - Continue olmesartan as prescribed - Monitor blood pressure  regularly with the new machine  Visual impairment requiring corrective lenses Reports improved vision with corrective lenses. Undergoing visual therapy and has an upcoming appointment with an ophthalmologist at [facility] to reassess her vision. - Continue wearing corrective lenses - Attend ophthalmology appointment at [facility] next month   Follow up 6 months for PE, sooner if needed  This note has been created using automated tools and reviewed for accuracy by provider.  Patient received an After Visit Summary    Attestation Statement:   I personally performed the service, non-incident to. (WP)   GLENDA LYNN FIELDS, NP   *Some images could not be shown.

## 2024-03-19 NOTE — Progress Notes (Signed)
 [facility] - Physiatry  PROCEDURE: 1. Bilateral L5-S1 transforaminal epidural injections under fluoroscopic guidance. 2. Use of fluoroscopic guidance was required to ensure adequate delivery of medication into the epidural space and for proper needle placement. 3. The patient was monitored with continuous pulse oximetry throughout the procedures.   DIAGNOSIS/INDICATION FOR PROCEDURE: The patient is a pleasant 63 year old female followed by [facility] for acute on chronic low back and left greater than right buttock pain.  Clinically she has symptoms consistent with a lumbosacral radiculitis.  MRI of the lumbar spine without contrast from [facility] dated 09/03/2021 imaging and report reviewed today. L5-S1 tiny central disc protrusion with moderate bilateral facet arthropathy. No central stenosis.  There is mild bilateral foraminal stenosis.  L4-5 slight disc bulge with mild bilateral facet arthropathy without central or foraminal stenosis. L3-4 to T12-L1 is unremarkable.   Procedures: 03/19/2024: Bilateral L5-S1 transforaminal ESI 10/31/2023: RFA to the bilateral L4-5 and L5-S1 facet joints (50% relief) 04/25/2023: Bilateral L5-S1 transforaminal ESI (75% relief) 11/02/2022: Bilateral L5-S1 transforaminal ESI (good relief)  07/25/2022: RFA to the bilateral L4-5 and L5-S1 facet joints  11/04/2021: RFA to the bilateral L4-5 and L5-S1 facet joints (80% relief) 10/12/2021: MBB to the bilateral L4-5 and L5-S1 facet joints (10/10 to 2/10) 09/16/2021: MBB to the bilateral L4-5 and L5-S1 facet joints (10/10 to 2/10) 12/22/2020: Left C5-6 transforaminal ESI (95% relief) 11/16/2020: Left C5-6 transforaminal ESI (80% relief until she started a new job) 09/04/2020: Left cervical trigger point injection (Dr. Sharrie) 08/03/2020: Left subacromial injection (Dr. Sharrie)   IMPRESSION/RESULTS: Patient tolerated the procedure well without any complications.  We used a  3.5 inch needle and 2% lidocaine .  Immediately after the procedure she reported 50% relief.  She will follow-up with Whitney in 3 to 4 weeks.   INFORMED CONSENT: The patient understood the potential risks and benefits of the procedure, which were explained to the patient prior to the procedure.  The patient read and signed the consent stating complete understanding of the information, and wished to proceed with the procedure, there were no barriers to understanding.  Ample time was given for any questions to be answered prior to the procedure. The risks of the procedure were explained including, but not limited to the risk of bleeding and/or infection into the spinal area, intervertebral discs, or epidural space; nerve injury; nerve irritation; reaction to medications; etc.  The patient denied any history of bleeding disorders, allergies to medications used or other medical contraindications to the procedure.  No promises were given to any expected outcome.     PROCEDURE: A time out was performed by physician and assistant.  Correct patient, procedure, plan of care, site, position, and equipment were verified.  Identical procedural technique was used for both epidural injections.  The patient was sterilely prepped with a triple scrub of betadine solution and draped in the prone position.  Careful attention was paid to aseptic technique throughout the procedure.  Then, the Right L5-S1 neuroforamen was identified under fluoroscopic guidance.  The overlying skin and subcutaneous tissues were anesthetized with approximately 2 cc of 1% Xylocaine .  A 25 gauge spinal needle was inserted down into the foramen.  Approximately 2 cc of Omnipaque -240 mgI/mL contrast was infiltrated under real time-fluoroscopy demonstrating satisfactory spread along the exiting spinal nerve and into the epidural space without vascular uptake.  No aspirate was noted.  Spot films were taken.  A solution containing 0.5 cc of dexamethasone   sodium phosphate  10 mg/cc and 0.5  cc of 2% preservative-free lidocaine  was slowly infiltrated around the spinal nerve and into the epidural space under real-time fluoroscopy.  There was good contrast washout.  The needle was removed.   Then, the Left L5-S1 neuroforamen was identified under fluoroscopic guidance.  The overlying skin and subcutaneous tissues were anesthetized with approximately 2 cc of 1% Xylocaine .  A 25 gauge spinal needle was inserted down into the foramen.  Approximately 2 cc of Omnipaque -240 mgI/mL contrast was infiltrated under real time-fluoroscopy demonstrating satisfactory spread along the exiting spinal nerve and into the epidural space without vascular uptake.  No aspirate was noted.  Spot films were taken.  A solution containing 0.5 cc of dexamethasone  sodium phosphate  10 mg/cc and 0.5 cc of 2% preservative-free lidocaine  was slowly infiltrated around the spinal nerve and into the epidural space under real-time fluoroscopy.  There was good contrast washout.  The needle was removed.  There were no complications.   DISCHARGE SUMMARY: The patient was monitored post-injection for 30 minutes in the recovery area and remained stable without evidence of complications.  The patient was discharged with discharge instructions in stable condition. Patient was instructed to contact us  with any problems.  Procedure fluoro time: 24 seconds. Fluoroscopy dose: 7.11 mGy.  This is below the dose threshold #1 (5000 mGy).    BENJAMIN CHARLES CHASNIS, DO

## 2024-03-20 ENCOUNTER — Ambulatory Visit

## 2024-03-20 ENCOUNTER — Ambulatory Visit: Admitting: Occupational Therapy

## 2024-03-20 DIAGNOSIS — M6281 Muscle weakness (generalized): Secondary | ICD-10-CM | POA: Diagnosis not present

## 2024-03-20 DIAGNOSIS — R41841 Cognitive communication deficit: Secondary | ICD-10-CM

## 2024-03-20 NOTE — Therapy (Signed)
 OUTPATIENT SPEECH LANGUAGE PATHOLOGY  TREATMENT   Patient Name: Ashley Collins MRN: [id number] DOB:12-May-1961, 63 y.o., female Today's Date: 03/20/2024  PCP: Gaetana Haddock, NP  REFERRING PROVIDER: same   End of Session - 03/20/24 1358     Visit Number 8    Number of Visits 24    Date for Recertification  05/14/24    SLP Start Time 1400    SLP Stop Time  1445    SLP Time Calculation (min) 45 min    Activity Tolerance Patient tolerated treatment well           Patient Active Problem List   Diagnosis Date Noted   Acute CVA (cerebrovascular accident) (HCC) 12/19/2023   Hypokalemia 12/19/2023   AKI (acute kidney injury) 12/19/2023   Leukocytosis 12/19/2023   Hyperlipidemia, unspecified 12/19/2023   Essential hypertension 12/19/2023   Vision changes 12/19/2023   Lymphedema 05/22/2023   Chronic venous insufficiency 05/22/2023   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 replacement therapy 04/10/2023   Insomnia due to medical condition 04/25/2022   GAD (generalized anxiety disorder) 02/02/2022   Other specified depressive episodes 02/02/2022   Long-term current use of benzodiazepine 02/02/2022   Migraine with aura and without status migrainosus, not intractable 11/03/2020   Arrhythmia 08/06/2020   Status migrainosus 02/25/2019   Osteoporosis 04/09/2013   Vitamin D deficiency 04/09/2013   DDD (degenerative disc disease), lumbosacral 02/08/2005    ONSET DATE: 12/19/23   REFERRING DIAG: CVA- memory deficits  THERAPY DIAG:  Cognitive communication deficit  Rationale for Evaluation and Treatment Rehabilitation  SUBJECTIVE:   SUBJECTIVE STATEMENT: Pt alert, pleasant, and cooperative. Pt accompanied by: self and significant other  PERTINENT HISTORY & DIAGNOSTIC FINDINGS: Pt is 63 y.o. female who presents today for a cognitive-communication evaluation in setting of stroke. MRI 12/18/23 1. 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 No associated  hemorrhage or mass effect. PMHx as outlined above.  PAIN:  Are you having pain? No   FALLS: Has patient fallen in last 6 months?  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   PATIENT GOALS  to return to PLOF   OBJECTIVE:  TODAY'S TREATMENT: Reviewed concept of use of journaling to improve short term and prospective memory. Pt has been journaling to improve short term and prospective memory. Reviewed attention strategies such as telling self to mentally focus and to narrate actions aloud (e.g. when cooking, placing items down, or while going to get something from another room). Pt endorsed utilizing at home with success. Reviewed modified Pomodoro method with pt taking regularly schedule rest breaks. Pt continues schedule day utilizing calendar as if she was at work with consideration for priority tasks and building in rest breaks. Reviewed types of memory and external memory strategies. Pt has been using calendar on iPhone. Pt utilized internal memory strategies to recall names/faces from last session. Introduced executive functioning and metacognitive strategies for improved executive functioning. Pt utilized with min A to complete a deductive reasoning puzzle. Additional puzzle given for HEP.   PATIENT EDUCATION: Education details: as above Person educated: Patient and Spouse Education method: Explanation; Handout Education comprehension: verbalized understanding  HOME EXERCISE PROGRAM:        Practice narrating actions  Telling self to mentally focus  Continue calendar use, blocking schedule, and journaling  Deductive reasoning puzzle      GOALS:  Goals reviewed with patient? Yes  SHORT TERM GOALS: Target date: 10 sessions  Pt will complete PROM re:  memory.  Baseline: Goal status: MET   2.  Pt will endorse successful implementation of at least x2 compensations for attention and memory.  Baseline:  Goal status: INITIAL  3.  With Moderate A, patient will establish external aid for memory/executive function and bring to more than 50% of therapy sessions.    Baseline:  Goal status: INITIAL    LONG TERM GOALS: Target date: 12 weeks  Pt will endorse improvement in cognitive-communication per PROM.  Baseline:  Goal status: INITIAL  2.  Pt and/or husband will demonstrate understanding of ways to promote and support cognitive-communication outside of SLP sessions.  Baseline:  Goal status: INITIAL   ASSESSMENT:  CLINICAL IMPRESSION:  Pt is 64 y.o. female who presents today for a cognitive-communication treatment in setting of stroke. Initial assessment completed via formal means (Cognitive-Linguistic Quick Test) and PROM (Neuro-QoL Adult Cognitive Function v2.0). Pt presents with at least mild cognitive-communication deficits affecting attention and visuospatial skills per CLQT as well as memory and executive functioning per PROM. Suspect CLQT is not as sensitive to higher level cognitive-communication deficits appreciated by pt during iADLs. See details of today's tx as outlined above. Recommend skilled ST services targeting above mentioned deficits to improve QoL and performance on ADLs/IADLs.  OBJECTIVE IMPAIRMENTS include attention, memory, executive functioning, and visuospatial deficits. These impairments are limiting patient from return to work, managing appointments, household responsibilities, and ADLs/IADLs. Factors affecting potential to achieve goals and functional outcome are medical prognosis.. Patient will benefit from skilled SLP services to address above impairments and improve overall function.  REHAB POTENTIAL: Good  PLAN: SLP FREQUENCY: 2x/week  SLP DURATION: 12 weeks  PLANNED INTERVENTIONS: Cueing hierachy,  Cognitive reorganization, Internal/external aids, Functional tasks, SLP instruction and feedback, Compensatory strategies, and Patient/family education    Delon Bangs, M.S., CCC-SLP Speech-Language Pathologist [facility] [phone] [address])  [facility] [address] [place], [place], [address] Phone: [phone]   Fax:  [phone]

## 2024-03-20 NOTE — Therapy (Addendum)
 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   Cardiac arrhythmia    Nonspecific ST T wave changes on EKG   Chronic venous insufficiency of lower extremity    Complication of anesthesia    nausea and vomiting   DDD (degenerative disc disease), lumbosacral    Essential hypertension    Headache    History of kidney stones    Hyperlipidemia    Kidney stones    Lymphedema    Migraines    Osteoporosis    PONV (postoperative nausea and vomiting)    Right ureteral stone    Vitamin B12 deficiency    Vitamin D deficiency    Past Surgical History:  Procedure Laterality Date   AUGMENTATION MAMMAPLASTY     CESAREAN SECTION     x 4   COLONOSCOPY WITH PROPOFOL  N/A 03/31/2022   Procedure: COLONOSCOPY WITH PROPOFOL ;  Surgeon: Onita Elspeth Sharper, DO;  Location: [facility] END[facility]COPY;  Service: Gastroenterology;  Laterality: N/A;   CYSTOSCOPY W/ RETROGRADES Bilateral 07/10/2020   Procedure: CYSTOSCOPY WITH RETROGRADE PYELOGRAM;  Surgeon: Francisca Redell BROCKS, MD;  Location: [facility] ORS;  Service: Urology;  Laterality: Bilateral;   CYSTOSCOPY/URETEROSCOPY/HOLMIUM LASER/STENT PLACEMENT     CYSTOSCOPY/URETEROSCOPY/HOLMIUM LASER/STENT PLACEMENT Bilateral 07/10/2020   Procedure: CYSTOSCOPY/URETEROSCOPY/HOLMIUM LASER/STENT PLACEMENT;  Surgeon: Francisca Redell BROCKS, MD;  Location: [facility] ORS;  Service: Urology;  Laterality: Bilateral;   CYSTOSCOPY/URETEROSCOPY/HOLMIUM  LASER/STENT PLACEMENT Right 08/25/2023   Procedure: CYSTOSCOPY/URETEROSCOPY/HOLMIUM LASER;  Surgeon: Francisca Redell BROCKS, MD;  Location: [facility] ORS;  Service: Urology;  Laterality: Right;   CYSTOSCOPY/URETEROSCOPY/HOLMIUM LASER/STENT PLACEMENT Right 12/15/2023   Procedure: CYSTOSCOPY/URETEROSCOPY/HOLMIUM LASER;  Surgeon: Francisca Redell BROCKS, MD;  Location: [facility] ORS;  Service: Urology;  Laterality: Right;   EXTRACORPOREAL SHOCK WAVE LITHOTRIPSY     x 10 plus   Eye Lift     FACIAL COSMETIC SURGERY     GANGLION CYST EXCISION Right 12/21/2021   Procedure: REMOVAL GANGLION OF WRIST;  Surgeon: Kathlynn Sharper, MD;  Location: [facility] ORS;  Service: Orthopedics;  Laterality: Right;   LIPOSUCTION     TONSILLECTOMY     TRIGGER FINGER RELEASE Right 12/21/2021   Procedure: RELEASE TRIGGER FINGER/A-1 PULLEY;  Surgeon: Kathlynn Sharper, MD;  Location: [facility] ORS;  Service: Orthopedics;  Laterality: Right;   URETEROSCOPY WITH HOLMIUM LASER LITHOTRIPSY     Patient Active Problem List   Diagnosis Date Noted   Acute CVA (cerebrovascular accident) (HCC) 12/19/2023   Hypokalemia 12/19/2023   AKI (acute kidney injury) 12/19/2023   Leukocytosis 12/19/2023   Hyperlipidemia, unspecified 12/19/2023   Essential hypertension 12/19/2023   Vision changes 12/19/2023   Lymphedema 05/22/2023   Chronic venous insufficiency 05/22/2023   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 replacement therapy 04/10/2023   Insomnia due to medical condition 04/25/2022   GAD (generalized anxiety  disorder) 02/02/2022   Other specified depressive episodes 02/02/2022   Long-term current use of benzodiazepine 02/02/2022   Migraine with aura and without status migrainosus, not intractable 11/03/2020   Arrhythmia 08/06/2020   Status migrainosus 02/25/2019   Osteoporosis 04/09/2013   Vitamin D deficiency 04/09/2013   DDD (degenerative disc disease), lumbosacral 02/08/2005   ONSET DATE: 12/18/2023  REFERRING DIAG:   THERAPY DIAG:  Muscle weakness  (generalized)  Rationale for Evaluation and Treatment: Rehabilitation  SUBJECTIVE:  SUBJECTIVE STATEMENT: Pt. reports that she has been cleaning out closets at home Pt accompanied by: significant other  PERTINENT HISTORY: Pt. has Hx of stroke with onset 12/18/2023. Pt. PMHx includes: HTN, Hyperlipidemia, Kidney Stones, near syncopal event, loss of vision.  PRECAUTIONS: None  WEIGHT BEARING RESTRICTIONS: None  PAIN:  Are you having pain? No  FALLS: Has patient fallen in last 6 months? Yes. Number of falls    LIVING ENVIRONMENT: Lives with: lives with their family Lives in: [place], [place] Stairs: No, inside the house, yes but does not use Has following equipment at home:   PLOF: Independent  PATIENT GOALS: To be able to see  OBJECTIVE:  Note: Objective measures were completed at Evaluation unless otherwise noted.  HAND DOMINANCE: Right  ADLs:  Eating: Drinking from straw is different, able to use utensils.  Grooming: Fatigues completing hair care.  UB Dressing: independent, if clothes are in front of her she can find clothing LB Dressing: Independent Toileting: Independent Bathing: Independent Tub Shower transfers: Independent Equipment: none Has difficulty with using cell phone  IADLs: Shopping: Does not typically go out shopping, and has not tried to. Light housekeeping: Pt. reports that she does not typically do house cleaning. Pt. has been able to unpack belongings from boxes due to her recent move in. Meal Prep: Pt. reports that is does not currently cook, however reports that she probably could. Has difficulty opening bottle caps/lids. Community mobility: Requires assistance from husband to navigate  through buildings, negotiate stairs-Pt. With increased fear of falling  Medication management: Able to push down medication bottles to open  Financial management: No change in the process-uses automatic bill pay system.  Has difficulty with using cell  phone Handwriting: N/T Work: Pt. was actively working managing a Health visitor, works a lot of hours  MOBILITY STATUS: Independent and Needs Assist: Requires hand on hand assistance with nvigating through environments.   POSTURE COMMENTS:  No Significant postural limitations Sitting balance: Good  ACTIVITY TOLERANCE: Activity tolerance: Good  FUNCTIONAL OUTCOME MEASURES:   UPPER EXTREMITY ROM:    Active ROM Right Eval WFL Left Eval [facility]  Shoulder flexion    Shoulder abduction    Shoulder adduction    Shoulder extension    Shoulder internal rotation    Shoulder external rotation    Elbow flexion    Elbow extension    Wrist flexion    Wrist extension    Wrist ulnar deviation    Wrist radial deviation    Wrist pronation    Wrist supination    (Blank rows = not tested)  UPPER EXTREMITY MMT:     MMT Right eval Right Left eval Left  Shoulder flexion 4-/5 5/5 4-/5 5/5  Shoulder abduction 4-/5 5/5 4-/5 5/5  Shoulder adduction      Shoulder extension      Shoulder internal rotation      Shoulder external rotation      Middle trapezius      Lower trapezius  Elbow flexion 5/5 5/5 5/5 5/5  Elbow extension 5/5 5/5 5/5 5/5  Wrist flexion 4/5 5/5 4-/5 5/5  Wrist extension 4/5 5/5 4-/5 5/5  Wrist ulnar deviation      Wrist radial deviation      Wrist pronation      Wrist supination      (Blank rows = not tested)  HAND FUNCTION: Grip strength: Right: 39 lbs; Left: 28 lbs, Lateral pinch: Right: 11 lbs, Left: 9 lbs, and 3 point pinch: Right: 9 lbs, Left: 9 lbs  01/29/24 Grip strength: Right: 39 lbs; Left: 36 lbs, Lateral pinch: Right: 14 lbs, Left: 12 lbs, and 3 point pinch: Right: 12 lbs, Left: 11 lbs   COORDINATION: 9 Hole Peg test: Right: 30 sec; Left: 36 sec  01/29/24 9 Hole Peg test: Right: 24 sec; Left: 23 sec   SENSATION: WFL  EDEMA:   MUSCLE TONE:   COGNITION: Overall cognitive status: Within functional limits for tasks  assessed  VISION: Subjective report: Pt. reports changes in her vision at onset of stroke, starting with blurry vision, resulting in vision loss in the R eye.  Baseline vision: Pt. 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 -Pt. was further challenged with dual tasking holding a conversation intermittently while completing math equations. -A visual memory component was added to the task working on writing/transferring numbers from the front to the back of the page prior to adding, or subtracting them.  HOME EXERCISE PROGRAM:  -Visual memory tasks -Writing tasks -visual motor tasks.  GOALS: Goals reviewed with patient? Yes  SHORT TERM GOALS: Target date: 04/17/2024    Pt. Will independently utilize HEP for hand strength, coordination, and visual compensatory strategies ADL/ADLs Baseline: 03/06/24: Independent Eval: No  current HEP  Goal status: Ongoing   LONG TERM GOALS: Target date: 05/29/2024   Pt. Will be able to independently implement visual scanning/visual search compensatory strategies for tasks within her extra personal space navigating through community environments 100% of the time.  Baseline: 01/29/24:  Independent  100% of the time within the therapy gym, and hallway. Eval: Pt. Requires increased assist to navigate through community environments. Goal status: Achieved  2.  Pt. Will be able to independently utilize visual scanning/visual search strategies  during ADLS/IADL within her near space, and during tabletop  top tasks. 100% of the time.  Baseline: 03/06/24: Initiates visual scanning 85% of the time  01/29/24: 75% Eval: Pt. Education to be provided.  Goal status: INITIAL  3.  Pt. Will be able to independently initiate  visual scanning techniques in her own environment to reduce risk of falls.  Baseline: 01/29/24: Education was provided, Pt. Is utlizing visual compensatory strategies/visual scanning with in her home. Eval: Pt. Education to be provided.  Goal status: Achieved  4.  Pt. Will increase BUE Grip Strength by 5# to be able to independently hold objects for ADL/IADL use. Baseline: 01/29/24: Grip strength: Right: 39 lbs; Left: 28 lbs, Eval: Right Grip: 39#, Left Grip: 28# Goal status: Achieved  5.  Pt. Will increase L Lateral Pinch strength by 2# to be able to independently twist off bottle caps/lids. Baseline: 01/29/24: Lateral pinch: Right: 14 lbs, Left: 12 lbs Eval: Lateral pinch: Right: 11 lbs, Left: 9 lbs  Goal status: Achieved  6.  Pt. Will increase BUE strength for shoulder flexion/abduction by 2 mm grades to independently complete hair care tasks. Baseline: 01/29/24: 5/5 overall Eval: Right Shoulder Flexion: 4-/5, Left Shoulder Flexion:4-/5, Right Shoulder Abduction: 4-/5, Left Shoulder Abduction: 4-/5 Goal status: Achieved  7.  Pt will be indep with medication set up using weekly pill organizer.  Baseline: 01/29/24: Independent 01/15/24: Daughter currently manages medication set up.  Goal status: Achieved  8. Pt will increase typing speed to 20-30 words per minute with at least 90% accuracy to work towards more efficient typing for job related  responsibilities.  Baseline: 03/06/24: 10 wpm with 92% accuracy. 01/29/24: Continue 01/15/24: 8wpm x88% accuracy=7 wpm  Goal status: Progressing/Ongoing  9. Pt will be able to scan small print on store receipts to check for errors with 100% accuracy using visual compensation strategies as needed.  Baseline: 03/06/24: Pt. Continues to present with  difficulty with visual scanning small  small items efficiently. 01/29/24: Pt. Continues to have difficulty scanning small print items. 01/15/24: Not yet attempted; required for return to work/job responsibility  Goal status:  Ongoing  10. Pt. will independently identify 8 items consistently from visual visual memory in preparation or ADLs.    Baseline: 03/06/24: Pt. is able to identify up to 6 picture items while seated at the tabletop. Pt. is able to consistently Identify 4 letters from visual memory when attention is divided.  01/29/24: Pt. is able to identify 6 items consistently from visual memory.    Gaol status: Ongoing      11. Pt. will be independently write 4 sentences  efficiently with 100% legibility, no deviation from writing on a blank line, and appropriate spacing between letters.              Baseline: 03/06/24: 4 lines with 75% legibility, positive deviation below the line, and excessive spacing between the words.              Goal status: Ongoing  ASSESSMENT:  CLINICAL IMPRESSION:  Pt. was able to perform visual memory skills recalling 2 sets of 3 digit numbers from the front of the page and transferring them to the back of the page prior to solving the addition equations. Pt. was able to complete addition of a series of four 3 digit numbers with 100% accuracy, as well as subtracting  moderately complex, and complex dollar equations with increased time required. Pt. was able to recall two 3 digit dollar amounts from the front of the page, and transfer them to the back of the page, however was unable to recall the amount of the cents for each. Pt. required increased time to complete the task, and often had to flip the page back and forth multiple times for accuracy with visual memory.  Pt. continues to benefit from Occupational Therapy services to improve her ability to use visual compensatory strategies, and improve overall BUE functioning in order to improve engagement in, and maximize  overall independence with ADL, and IADL tasks.  PERFORMANCE DEFICITS: in functional skills including ADLs, IADLs, coordination, dexterity, Fine motor control, and vision, and psychosocial skills including coping strategies, environmental adaptation, habits, interpersonal interactions, and routines and behaviors.   IMPAIRMENTS: are limiting patient from ADLs, IADLs, rest and sleep, work, leisure, and social participation.   CO-MORBIDITIES: may have co-morbidities  that affects occupational performance. Patient will benefit from skilled OT to address above impairments and improve overall function.  MODIFICATION OR ASSISTANCE TO COMPLETE EVALUATION: Min-Moderate modification of tasks or assist with assess necessary to complete an evaluation.  OT OCCUPATIONAL PROFILE AND HISTORY: Detailed assessment: Review of records and additional review of physical, cognitive, psychosocial history related to current functional performance.  CLINICAL DECISION MAKING: Moderate - several treatment options, min-mod task modification necessary  REHAB POTENTIAL: Good  EVALUATION COMPLEXITY: Moderate    PLAN:  OT FREQUENCY: 2x/week  OT DURATION: 12 weeks  PLANNED INTERVENTIONS: 97168 OT Re-evaluation, 97535 self care/ADL training, 02889 therapeutic exercise, 97530 therapeutic activity, 97112 neuromuscular re-education, visual/perceptual remediation/compensation, patient/family education, and DME and/or AE instructions  RECOMMENDED OTHER SERVICES: ST  CONSULTED AND AGREED WITH PLAN OF CARE: Patient and family member/caregiver  PLAN FOR NEXT SESSION: Treatment  Richardson Otter, MS, OTR/L   03/20/24, 1:27 PM

## 2024-03-26 ENCOUNTER — Ambulatory Visit: Admitting: Occupational Therapy

## 2024-03-26 ENCOUNTER — Ambulatory Visit: Admitting: Physical Therapy

## 2024-03-26 ENCOUNTER — Encounter: Payer: Self-pay | Admitting: Occupational Therapy

## 2024-03-26 ENCOUNTER — Ambulatory Visit

## 2024-03-26 DIAGNOSIS — R278 Other lack of coordination: Secondary | ICD-10-CM

## 2024-03-26 DIAGNOSIS — H543 Unqualified visual loss, both eyes: Secondary | ICD-10-CM

## 2024-03-26 DIAGNOSIS — M6281 Muscle weakness (generalized): Secondary | ICD-10-CM

## 2024-03-26 DIAGNOSIS — H547 Unspecified visual loss: Secondary | ICD-10-CM

## 2024-03-26 DIAGNOSIS — R41841 Cognitive communication deficit: Secondary | ICD-10-CM

## 2024-03-26 NOTE — Therapy (Addendum)
 Occupational Therapy Progress Note   Patient Name: Ashley Collins MRN: [id number] DOB:Jun 04, 1961, 63 y.o., female Today's Date: 03/26/2024  PCP: Harvey Gaetana CROME, NP REFERRING PROVIDER: Harvey Gaetana CROME, NP   OT End of Session - 03/26/24 1955     Visit Number 25    Number of Visits 48    Date for Recertification  05/29/24    OT Start Time 1615    OT Stop Time 1710    OT Time Calculation (min) 55 min    Activity Tolerance Patient tolerated treatment well    Behavior During Therapy WFL for tasks assessed/performed              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   Cardiac arrhythmia    Nonspecific ST T wave changes on EKG   Chronic venous insufficiency of lower extremity    Complication of anesthesia    nausea and vomiting   DDD (degenerative disc disease), lumbosacral    Essential hypertension    Headache    History of kidney stones    Hyperlipidemia    Kidney stones    Lymphedema    Migraines    Osteoporosis    PONV (postoperative nausea and vomiting)    Right ureteral stone    Vitamin B12 deficiency    Vitamin D deficiency    Past Surgical History:  Procedure Laterality Date   AUGMENTATION MAMMAPLASTY     CESAREAN SECTION     x 4   COLONOSCOPY WITH PROPOFOL  N/A 03/31/2022   Procedure: COLONOSCOPY WITH PROPOFOL ;  Surgeon: Onita Elspeth Sharper, DO;  Location: [facility] ENDOSCOPY;  Service: Gastroenterology;  Laterality: N/A;   CYSTOSCOPY W/ RETROGRADES Bilateral 07/10/2020   Procedure: CYSTOSCOPY WITH RETROGRADE PYELOGRAM;  Surgeon: Francisca Redell BROCKS, MD;  Location: [facility] ORS;  Service: Urology;  Laterality: Bilateral;   CYSTOSCOPY/URETEROSCOPY/HOLMIUM LASER/STENT PLACEMENT     CYSTOSCOPY/URETEROSCOPY/HOLMIUM LASER/STENT PLACEMENT Bilateral 07/10/2020   Procedure: CYSTOSCOPY/URETEROSCOPY/HOLMIUM LASER/STENT PLACEMENT;  Surgeon: Francisca Redell BROCKS, MD;  Location: [facility] ORS;  Service: Urology;   Laterality: Bilateral;   CYSTOSCOPY/URETEROSCOPY/HOLMIUM LASER/STENT PLACEMENT Right 08/25/2023   Procedure: CYSTOSCOPY/URETEROSCOPY/HOLMIUM LASER;  Surgeon: Francisca Redell BROCKS, MD;  Location: [facility] ORS;  Service: Urology;  Laterality: Right;   CYSTOSCOPY/URETEROSCOPY/HOLMIUM LASER/STENT PLACEMENT Right 12/15/2023   Procedure: CYSTOSCOPY/URETEROSCOPY/HOLMIUM LASER;  Surgeon: Francisca Redell BROCKS, MD;  Location: [facility] ORS;  Service: Urology;  Laterality: Right;   EXTRACORPOREAL SHOCK WAVE LITHOTRIPSY     x 10 plus   Eye Lift     FACIAL COSMETIC SURGERY     GANGLION CYST EXCISION Right 12/21/2021   Procedure: REMOVAL GANGLION OF WRIST;  Surgeon: Kathlynn Sharper, MD;  Location: [facility] ORS;  Service: Orthopedics;  Laterality: Right;   LIPOSUCTION     TONSILLECTOMY     TRIGGER FINGER RELEASE Right 12/21/2021   Procedure: RELEASE TRIGGER FINGER/A-1 PULLEY;  Surgeon: Kathlynn Sharper, MD;  Location: [facility] ORS;  Service: Orthopedics;  Laterality: Right;   URETEROSCOPY WITH HOLMIUM LASER LITHOTRIPSY     Patient Active Problem List   Diagnosis Date Noted   Acute CVA (cerebrovascular accident) (HCC) 12/19/2023   Hypokalemia 12/19/2023   AKI (acute kidney injury) 12/19/2023   Leukocytosis 12/19/2023   Hyperlipidemia, unspecified 12/19/2023   Essential hypertension 12/19/2023   Vision changes 12/19/2023   Lymphedema 05/22/2023   Chronic venous insufficiency 05/22/2023   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 replacement therapy 04/10/2023   Insomnia due  to medical condition 04/25/2022   GAD (generalized anxiety disorder) 02/02/2022   Other specified depressive episodes 02/02/2022   Long-term current use of benzodiazepine 02/02/2022   Migraine with aura and without status migrainosus, not intractable 11/03/2020   Arrhythmia 08/06/2020   Status migrainosus 02/25/2019   Osteoporosis 04/09/2013   Vitamin D deficiency 04/09/2013   DDD (degenerative disc disease), lumbosacral 02/08/2005   ONSET DATE:  12/18/2023  REFERRING DIAG:   THERAPY DIAG:  Muscle weakness (generalized)  Other lack of coordination  Visual impairment  Low vision, both eyes  Rationale for Evaluation and Treatment: Rehabilitation  SUBJECTIVE:  SUBJECTIVE STATEMENT: Pt reports she still has a hard time with some tasks requiring visual memory.  She has been working on Chiropractor.  Pt accompanied by: significant other  PERTINENT HISTORY: Pt. has Hx of stroke with onset 12/18/2023. Pt. PMHx includes: HTN, Hyperlipidemia, Kidney Stones, near syncopal event, loss of vision.  PRECAUTIONS: None  WEIGHT BEARING RESTRICTIONS: None  PAIN:  Are you having pain? No  FALLS: Has patient fallen in last 6 months? Yes. Number of falls    LIVING ENVIRONMENT: Lives with: lives with their family Lives in: [place], [place] Stairs: No, inside the house, yes but does not use Has following equipment at home:   PLOF: Independent  PATIENT GOALS: To be able to see  OBJECTIVE:  Note: Objective measures were completed at Evaluation unless otherwise noted.  HAND DOMINANCE: Right  ADLs:  Eating: Drinking from straw is different, able to use utensils.  Grooming: Fatigues completing hair care.  UB Dressing: independent, if clothes are in front of her she can find clothing LB Dressing: Independent Toileting: Independent Bathing: Independent Tub Shower transfers: Independent Equipment: none Has difficulty with using cell phone  IADLs: Shopping: Does not typically go out shopping, and has not tried to. Light housekeeping: Pt. reports that she does not typically do house cleaning. Pt. has been able to unpack belongings from boxes due to her recent move in. Meal Prep: Pt. reports that is does not currently cook, however reports that she probably could. Has difficulty opening bottle caps/lids. Community mobility: Requires assistance from husband to navigate  through buildings, negotiate stairs-Pt. With increased fear of  falling  Medication management: Able to push down medication bottles to open  Financial management: No change in the process-uses automatic bill pay system.  Has difficulty with using cell phone Handwriting: N/T Work: Pt. was actively working managing a Health visitor, works a lot of hours  MOBILITY STATUS: Independent and Needs Assist: Requires hand on hand assistance with nvigating through environments.   POSTURE COMMENTS:  No Significant postural limitations Sitting balance: Good  ACTIVITY TOLERANCE: Activity tolerance: Good  FUNCTIONAL OUTCOME MEASURES:   UPPER EXTREMITY ROM:    Active ROM Right Eval WFL Left Eval [facility]  Shoulder flexion    Shoulder abduction    Shoulder adduction    Shoulder extension    Shoulder internal rotation    Shoulder external rotation    Elbow flexion    Elbow extension    Wrist flexion    Wrist extension    Wrist ulnar deviation    Wrist radial deviation    Wrist pronation    Wrist supination    (Blank rows = not tested)  UPPER EXTREMITY MMT:     MMT Right eval Right Left eval Left  Shoulder flexion 4-/5 5/5 4-/5 5/5  Shoulder abduction 4-/5 5/5 4-/5 5/5  Shoulder adduction      Shoulder extension  Shoulder internal rotation      Shoulder external rotation      Middle trapezius      Lower trapezius      Elbow flexion 5/5 5/5 5/5 5/5  Elbow extension 5/5 5/5 5/5 5/5  Wrist flexion 4/5 5/5 4-/5 5/5  Wrist extension 4/5 5/5 4-/5 5/5  Wrist ulnar deviation      Wrist radial deviation      Wrist pronation      Wrist supination      (Blank rows = not tested)  HAND FUNCTION: Grip strength: Right: 39 lbs; Left: 28 lbs, Lateral pinch: Right: 11 lbs, Left: 9 lbs, and 3 point pinch: Right: 9 lbs, Left: 9 lbs  01/29/24 Grip strength: Right: 39 lbs; Left: 36 lbs, Lateral pinch: Right: 14 lbs, Left: 12 lbs, and 3 point pinch: Right: 12 lbs, Left: 11 lbs   COORDINATION: 9 Hole Peg test: Right: 30 sec; Left: 36  sec  01/29/24 9 Hole Peg test: Right: 24 sec; Left: 23 sec   SENSATION: WFL  EDEMA:   MUSCLE TONE:   COGNITION: Overall cognitive status: Within functional limits for tasks assessed  VISION: Subjective report: Pt. reports changes in her vision at onset of stroke, starting with blurry vision, resulting in vision loss in the R eye.  Baseline vision: Pt. Is able to visually track  Visual history: Hx of eye surgery  VISION ASSESSMENT: TBD  PERCEPTION: WFL  PRAXIS: WFL  OBSERVATIONS:                                                                                                                    TREATMENT DATE: 03/26/24   Therapeutic Activities:  Pt seen this date with focus on visual memory tasks utilizing lists as follows:  Pt formulating a list of 10 vegetables, therapist also made a separate list.  Therapist shared list with pt and then list was removed and pt was required to note items she recalled from the lists which were not the same.  Another round completed with items that were the same on each list.  Pt demonstrated difficulty with recalling items which were different from the lists 2 of 5 but had an improved performance with recall of items that were similar, recall 6/10.  Visual memory task with dice, pt to recall a series of 3 numbers, 4 numbers and 5 numbers from dice rolled.  She was consistent with 3 numbers, able to recall 3 of 5 accurately for 4 numbers and demonstrated difficulty with 5 number sequences.  Repeated with the use of letters versus numbers and pt had a similar result.   Handwriting skills for name, address and also filling out an envelope with address to a family member and return address.  Placement, size of letters and information correct with good legibility.    HOME EXERCISE PROGRAM:  -Visual memory tasks -Writing tasks -visual motor tasks.  GOALS: Goals reviewed with patient? Yes  SHORT TERM GOALS: Target date: 04/17/2024  Pt. Will  independently utilize HEP for hand strength, coordination, and visual compensatory strategies ADL/ADLs Baseline: 03/06/24: Independent Eval: No  current HEP  Goal status: Ongoing   LONG TERM GOALS: Target date: 05/29/2024   Pt. Will be able to independently implement visual scanning/visual search compensatory strategies for tasks within her extra personal space navigating through community environments 100% of the time.  Baseline: 01/29/24:  Independent  100% of the time within the therapy gym, and hallway. Eval: Pt. Requires increased assist to navigate through community environments. Goal status: Achieved  2.  Pt. Will be able to independently utilize visual scanning/visual search strategies  during ADLS/IADL within her near space, and during tabletop top tasks. 100% of the time.  Baseline: 03/06/24: Initiates visual scanning 85% of the time  01/29/24: 75% Eval: Pt. Education to be provided.  Goal status: INITIAL  3.  Pt. Will be able to independently initiate visual scanning techniques in her own environment to reduce risk of falls.  Baseline: 01/29/24: Education was provided, Pt. Is utlizing visual compensatory strategies/visual scanning with in her home. Eval: Pt. Education to be provided.  Goal status: Achieved  4.  Pt. Will increase BUE Grip Strength by 5# to be able to independently hold objects for ADL/IADL use. Baseline: 01/29/24: Grip strength: Right: 39 lbs; Left: 28 lbs, Eval: Right Grip: 39#, Left Grip: 28# Goal status: Achieved  5.  Pt. Will increase L Lateral Pinch strength by 2# to be able to independently twist off bottle caps/lids. Baseline: 01/29/24: Lateral pinch: Right: 14 lbs, Left: 12 lbs Eval: Lateral pinch: Right: 11 lbs, Left: 9 lbs  Goal status: Achieved  6.  Pt. Will increase BUE strength for shoulder flexion/abduction by 2 mm grades to independently complete hair care tasks. Baseline: 01/29/24: 5/5 overall Eval: Right Shoulder Flexion: 4-/5, Left Shoulder  Flexion:4-/5, Right Shoulder Abduction: 4-/5, Left Shoulder Abduction: 4-/5 Goal status: Achieved  7.  Pt will be indep with medication set up using weekly pill organizer.  Baseline: 01/29/24: Independent 01/15/24: Daughter currently manages medication set up.  Goal status: Achieved  8. Pt will increase typing speed to 20-30 words per minute with at least 90% accuracy to work towards more efficient typing for job related  responsibilities.  Baseline: 03/06/24: 10 wpm with 92% accuracy. 01/29/24: Continue 01/15/24: 8wpm x88% accuracy=7 wpm  Goal status: Progressing/Ongoing  9. Pt will be able to scan small print on store receipts to check for errors with 100% accuracy using visual compensation strategies as needed.  Baseline: 03/06/24: Pt. Continues to present with difficulty with visual scanning small  small items efficiently. 01/29/24: Pt. Continues to have difficulty scanning small print items. 01/15/24: Not yet attempted; required for return to work/job responsibility  Goal status:  Ongoing  10. Pt. will independently identify 8 items consistently from visual visual memory in preparation or ADLs.    Baseline: 03/06/24: Pt. is able to identify up to 6 picture items while seated at the tabletop. Pt. is able to consistently Identify 4 letters from visual memory when attention is divided.  01/29/24: Pt. is able to identify 6 items consistently from visual memory.    Gaol status: Ongoing      11. Pt. will be independently write 4 sentences  efficiently with 100% legibility, no deviation from writing on a blank line, and appropriate spacing between letters.              Baseline: 03/06/24: 4 lines with 75% legibility, positive deviation below the line, and excessive spacing  between the words.              Goal status: Ongoing  ASSESSMENT:  CLINICAL IMPRESSION: Pt progressing well with tasks.  Pt continues to demonstrate difficulty with visual memory tasks involving letters and numbers, does well with  sequence of 3, min difficulty with 4 items and moderate difficulty with 5 or more.  Handwriting is improving, she performed well with filling out envelope with recall of a family members address and correct placement of all info with good legibility.  Discussed a variety of work tasks and demands, will continue to work towards these tasks as well as IADL tasks at home to maximize safety and independence in necessary daily tasks.     PERFORMANCE DEFICITS: in functional skills including ADLs, IADLs, coordination, dexterity, Fine motor control, and vision, and psychosocial skills including coping strategies, environmental adaptation, habits, interpersonal interactions, and routines and behaviors.   IMPAIRMENTS: are limiting patient from ADLs, IADLs, rest and sleep, work, leisure, and social participation.   CO-MORBIDITIES: may have co-morbidities  that affects occupational performance. Patient will benefit from skilled OT to address above impairments and improve overall function.  MODIFICATION OR ASSISTANCE TO COMPLETE EVALUATION: Min-Moderate modification of tasks or assist with assess necessary to complete an evaluation.  OT OCCUPATIONAL PROFILE AND HISTORY: Detailed assessment: Review of records and additional review of physical, cognitive, psychosocial history related to current functional performance.  CLINICAL DECISION MAKING: Moderate - several treatment options, min-mod task modification necessary  REHAB POTENTIAL: Good  EVALUATION COMPLEXITY: Moderate    PLAN:  OT FREQUENCY: 2x/week  OT DURATION: 12 weeks  PLANNED INTERVENTIONS: 97168 OT Re-evaluation, 97535 self care/ADL training, 02889 therapeutic exercise, 97530 therapeutic activity, 97112 neuromuscular re-education, visual/perceptual remediation/compensation, patient/family education, and DME and/or AE instructions  RECOMMENDED OTHER SERVICES: ST  CONSULTED AND AGREED WITH PLAN OF CARE: Patient and family  member/caregiver  PLAN FOR NEXT SESSION: Treatment  Nahshon Reich T Lamarr Feenstra, OTR/L, CLT  03/20/24, 7:57 PM

## 2024-03-26 NOTE — Therapy (Signed)
 OUTPATIENT SPEECH LANGUAGE PATHOLOGY  TREATMENT   Patient Name: Ashley Collins MRN: [id number] DOB:1960/11/21, 63 y.o., female Today's Date: 03/26/2024  PCP: Gaetana Haddock, NP  REFERRING PROVIDER: same   End of Session - 03/26/24 1527     Visit Number 9    Number of Visits 24    Date for Recertification  05/14/24    SLP Start Time 1530    SLP Stop Time  1615    SLP Time Calculation (min) 45 min    Activity Tolerance Patient tolerated treatment well           Patient Active Problem List   Diagnosis Date Noted   Acute CVA (cerebrovascular accident) (HCC) 12/19/2023   Hypokalemia 12/19/2023   AKI (acute kidney injury) 12/19/2023   Leukocytosis 12/19/2023   Hyperlipidemia, unspecified 12/19/2023   Essential hypertension 12/19/2023   Vision changes 12/19/2023   Lymphedema 05/22/2023   Chronic venous insufficiency 05/22/2023   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 replacement therapy 04/10/2023   Insomnia due to medical condition 04/25/2022   GAD (generalized anxiety disorder) 02/02/2022   Other specified depressive episodes 02/02/2022   Long-term current use of benzodiazepine 02/02/2022   Migraine with aura and without status migrainosus, not intractable 11/03/2020   Arrhythmia 08/06/2020   Status migrainosus 02/25/2019   Osteoporosis 04/09/2013   Vitamin D deficiency 04/09/2013   DDD (degenerative disc disease), lumbosacral 02/08/2005    ONSET DATE: 12/19/23   REFERRING DIAG: CVA- memory deficits  THERAPY DIAG:  Cognitive communication deficit  Rationale for Evaluation and Treatment Rehabilitation  SUBJECTIVE:   SUBJECTIVE STATEMENT: Pt alert, pleasant, and cooperative. Pt accompanied by: self and significant other  PERTINENT HISTORY & DIAGNOSTIC FINDINGS: Pt is 62 y.o. female who presents today for a cognitive-communication evaluation in setting of stroke. MRI 12/18/23 1. 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 No associated  hemorrhage or mass effect. PMHx as outlined above.  PAIN:  Are you having pain? No   FALLS: Has patient fallen in last 6 months?  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   PATIENT GOALS  to return to PLOF   OBJECTIVE:  TODAY'S TREATMENT: Reviewed concept of use of journaling to improve short term and prospective memory. Pt has been journaling to improve short term and prospective memory. Reviewed attention strategies such as telling self to mentally focus and to narrate actions aloud (e.g. when cooking, placing items down, or while going to get something from another room). Pt endorsed utilizing at home with success. Reviewed modified Pomodoro method with pt taking regularly schedule rest breaks. Pt continues schedule day utilizing calendar as if she was at work with consideration for priority tasks and building in rest breaks. Pt has now started color coding tasks for easier access. Pt also removed excess apps/organized home screen for easier scanning/access to information. Reviewed types of memory and external memory strategies. Pt has been using calendar on iPhone. Pt utilized internal memory strategies to recall names/faces from previous session. Reviewed executive functioning and metacognitive strategies for improved executive functioning. Pt utilized with min A to complete a deductive reasoning puzzle. Additional puzzles given for HEP.   PATIENT EDUCATION: Education details: as above Person educated: Patient and Spouse Education method: Explanation; Handout Education comprehension: verbalized understanding  HOME EXERCISE PROGRAM:  Practice narrating actions  Telling self to mentally focus  Continue calendar use, blocking schedule, and  journaling  Deductive reasoning puzzle      GOALS:  Goals reviewed with patient? Yes  SHORT TERM GOALS: Target date: 10 sessions  Pt will complete PROM re: memory.  Baseline: Goal status: MET   2.  Pt will endorse successful implementation of at least x2 compensations for attention and memory.  Baseline:  Goal status: INITIAL  3.  With Moderate A, patient will establish external aid for memory/executive function and bring to more than 50% of therapy sessions.    Baseline:  Goal status: INITIAL    LONG TERM GOALS: Target date: 12 weeks  Pt will endorse improvement in cognitive-communication per PROM.  Baseline:  Goal status: INITIAL  2.  Pt and/or husband will demonstrate understanding of ways to promote and support cognitive-communication outside of SLP sessions.  Baseline:  Goal status: INITIAL   ASSESSMENT:  CLINICAL IMPRESSION:  Pt is 63 y.o. female who presents today for a cognitive-communication treatment in setting of stroke. Initial assessment completed via formal means (Cognitive-Linguistic Quick Test) and PROM (Neuro-QoL Adult Cognitive Function v2.0). Pt presents with at least mild cognitive-communication deficits affecting attention and visuospatial skills per CLQT as well as memory and executive functioning per PROM. Suspect CLQT is not as sensitive to higher level cognitive-communication deficits appreciated by pt during iADLs. See details of today's tx as outlined above. Recommend skilled ST services targeting above mentioned deficits to improve QoL and performance on ADLs/IADLs.  OBJECTIVE IMPAIRMENTS include attention, memory, executive functioning, and visuospatial deficits. These impairments are limiting patient from return to work, managing appointments, household responsibilities, and ADLs/IADLs. Factors affecting potential to achieve goals and functional outcome are medical prognosis.. Patient will benefit from skilled SLP services to address above  impairments and improve overall function.  REHAB POTENTIAL: Good  PLAN: SLP FREQUENCY: 2x/week  SLP DURATION: 12 weeks  PLANNED INTERVENTIONS: Cueing hierachy, Cognitive reorganization, Internal/external aids, Functional tasks, SLP instruction and feedback, Compensatory strategies, and Patient/family education    Delon Bangs, M.S., CCC-SLP Speech-Language Pathologist [facility] [phone] [address])   [facility] at [facility] [address] [place], [place], [address] Phone: [phone]   Fax:  [phone]

## 2024-03-28 ENCOUNTER — Ambulatory Visit: Admitting: Physical Therapy

## 2024-03-28 ENCOUNTER — Ambulatory Visit: Attending: Family Medicine

## 2024-03-28 ENCOUNTER — Ambulatory Visit: Admitting: Occupational Therapy

## 2024-03-28 DIAGNOSIS — M5459 Other low back pain: Secondary | ICD-10-CM | POA: Insufficient documentation

## 2024-03-28 DIAGNOSIS — M546 Pain in thoracic spine: Secondary | ICD-10-CM | POA: Diagnosis present

## 2024-03-28 DIAGNOSIS — R278 Other lack of coordination: Secondary | ICD-10-CM | POA: Diagnosis present

## 2024-03-28 DIAGNOSIS — H543 Unqualified visual loss, both eyes: Secondary | ICD-10-CM | POA: Diagnosis present

## 2024-03-28 DIAGNOSIS — M6281 Muscle weakness (generalized): Secondary | ICD-10-CM | POA: Diagnosis present

## 2024-03-28 DIAGNOSIS — R41841 Cognitive communication deficit: Secondary | ICD-10-CM | POA: Insufficient documentation

## 2024-03-28 NOTE — Therapy (Signed)
 Occupational Therapy Progress Note   Patient Name: Ashley Collins MRN: [id number] DOB:1960-07-03, 63 y.o., female Today's Date: 03/28/2024  PCP: Harvey Gaetana CROME, NP REFERRING PROVIDER: Harvey Gaetana CROME, NP   OT End of Session - 03/28/24 1731     Visit Number 26    Number of Visits 48    Date for Recertification  05/29/24    OT Start Time 1400    OT Stop Time 1445    OT Time Calculation (min) 45 min    Activity Tolerance Patient tolerated treatment well    Behavior During Therapy WFL for tasks assessed/performed              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   Cardiac arrhythmia    Nonspecific ST T wave changes on EKG   Chronic venous insufficiency of lower extremity    Complication of anesthesia    nausea and vomiting   DDD (degenerative disc disease), lumbosacral    Essential hypertension    Headache    History of kidney stones    Hyperlipidemia    Kidney stones    Lymphedema    Migraines    Osteoporosis    PONV (postoperative nausea and vomiting)    Right ureteral stone    Vitamin B12 deficiency    Vitamin D deficiency    Past Surgical History:  Procedure Laterality Date   AUGMENTATION MAMMAPLASTY     CESAREAN SECTION     x 4   COLONOSCOPY WITH PROPOFOL  N/A 03/31/2022   Procedure: COLONOSCOPY WITH PROPOFOL ;  Surgeon: Onita Elspeth Sharper, DO;  Location: [facility];  Service: Gastroenterology;  Laterality: N/A;   CYSTOSCOPY W/ RETROGRADES Bilateral 07/10/2020   Procedure: CYSTOSCOPY WITH RETROGRADE PYELOGRAM;  Surgeon: Francisca Redell BROCKS, MD;  Location: [facility];  Service: Urology;  Laterality: Bilateral;   CYSTOSCOPY/URETEROSCOPY/HOLMIUM LASER/STENT PLACEMENT     CYSTOSCOPY/URETEROSCOPY/HOLMIUM LASER/STENT PLACEMENT Bilateral 07/10/2020   Procedure: CYSTOSCOPY/URETEROSCOPY/HOLMIUM LASER/STENT PLACEMENT;  Surgeon: Francisca Redell BROCKS, MD;  Location: [facility];  Service: Urology;   Laterality: Bilateral;   CYSTOSCOPY/URETEROSCOPY/HOLMIUM LASER/STENT PLACEMENT Right 08/25/2023   Procedure: CYSTOSCOPY/URETEROSCOPY/HOLMIUM LASER;  Surgeon: Francisca Redell BROCKS, MD;  Location: [facility];  Service: Urology;  Laterality: Right;   CYSTOSCOPY/URETEROSCOPY/HOLMIUM LASER/STENT PLACEMENT Right 12/15/2023   Procedure: CYSTOSCOPY/URETEROSCOPY/HOLMIUM LASER;  Surgeon: Francisca Redell BROCKS, MD;  Location: [facility];  Service: Urology;  Laterality: Right;   EXTRACORPOREAL SHOCK WAVE LITHOTRIPSY     x 10 plus   Eye Lift     FACIAL COSMETIC SURGERY     GANGLION CYST EXCISION Right 12/21/2021   Procedure: REMOVAL GANGLION OF WRIST;  Surgeon: Kathlynn Sharper, MD;  Location: [facility];  Service: Orthopedics;  Laterality: Right;   LIPOSUCTION     TONSILLECTOMY     TRIGGER FINGER RELEASE Right 12/21/2021   Procedure: RELEASE TRIGGER FINGER/A-1 PULLEY;  Surgeon: Kathlynn Sharper, MD;  Location: [facility];  Service: Orthopedics;  Laterality: Right;   URETEROSCOPY WITH HOLMIUM LASER LITHOTRIPSY     Patient Active Problem List   Diagnosis Date Noted   Acute CVA (cerebrovascular accident) (HCC) 12/19/2023   Hypokalemia 12/19/2023   AKI (acute kidney injury) 12/19/2023   Leukocytosis 12/19/2023   Hyperlipidemia, unspecified 12/19/2023   Essential hypertension 12/19/2023   Vision changes 12/19/2023   Lymphedema 05/22/2023   Chronic venous insufficiency 05/22/2023   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 replacement therapy 04/10/2023   Insomnia due  to medical condition 04/25/2022   GAD (generalized anxiety disorder) 02/02/2022   Other specified depressive episodes 02/02/2022   Long-term current use of benzodiazepine 02/02/2022   Migraine with aura and without status migrainosus, not intractable 11/03/2020   Arrhythmia 08/06/2020   Status migrainosus 02/25/2019   Osteoporosis 04/09/2013   Vitamin D deficiency 04/09/2013   DDD (degenerative disc disease), lumbosacral 02/08/2005   ONSET DATE:  12/18/2023  REFERRING DIAG:   THERAPY DIAG:  Muscle weakness (generalized)  Rationale for Evaluation and Treatment: Rehabilitation  SUBJECTIVE:  SUBJECTIVE STATEMENT: Pt. reports that she tried the [employer] at home.  Pt accompanied by: significant other  PERTINENT HISTORY: Pt. has Hx of stroke with onset 12/18/2023. Pt. PMHx includes: HTN, Hyperlipidemia, Kidney Stones, near syncopal event, loss of vision.  PRECAUTIONS: None  WEIGHT BEARING RESTRICTIONS: None  PAIN:  Are you having pain? No  FALLS: Has patient fallen in last 6 months? Yes. Number of falls    LIVING ENVIRONMENT: Lives with: lives with their family Lives in: [place], [place] Stairs: No, inside the house, yes but does not use Has following equipment at home:   PLOF: Independent  PATIENT GOALS: To be able to see  OBJECTIVE:  Note: Objective measures were completed at Evaluation unless otherwise noted.  HAND DOMINANCE: Right  ADLs:  Eating: Drinking from straw is different, able to use utensils.  Grooming: Fatigues completing hair care.  UB Dressing: independent, if clothes are in front of her she can find clothing LB Dressing: Independent Toileting: Independent Bathing: Independent Tub Shower transfers: Independent Equipment: none Has difficulty with using cell phone  IADLs: Shopping: Does not typically go out shopping, and has not tried to. Light housekeeping: Pt. reports that she does not typically do house cleaning. Pt. has been able to unpack belongings from boxes due to her recent move in. Meal Prep: Pt. reports that is does not currently cook, however reports that she probably could. Has difficulty opening bottle caps/lids. Community mobility: Requires assistance from husband to navigate  through buildings, negotiate stairs-Pt. With increased fear of falling  Medication management: Able to push down medication bottles to open  Financial management: No change in the process-uses  automatic bill pay system.  Has difficulty with using cell phone Handwriting: N/T Work: Pt. was actively working managing a Health visitor, works a lot of hours  MOBILITY STATUS: Independent and Needs Assist: Requires hand on hand assistance with nvigating through environments.   POSTURE COMMENTS:  No Significant postural limitations Sitting balance: Good  ACTIVITY TOLERANCE: Activity tolerance: Good  FUNCTIONAL OUTCOME MEASURES:   UPPER EXTREMITY ROM:    Active ROM Right Eval WFL Left Eval [facility]  Shoulder flexion    Shoulder abduction    Shoulder adduction    Shoulder extension    Shoulder internal rotation    Shoulder external rotation    Elbow flexion    Elbow extension    Wrist flexion    Wrist extension    Wrist ulnar deviation    Wrist radial deviation    Wrist pronation    Wrist supination    (Blank rows = not tested)  UPPER EXTREMITY MMT:     MMT Right eval Right Left eval Left  Shoulder flexion 4-/5 5/5 4-/5 5/5  Shoulder abduction 4-/5 5/5 4-/5 5/5  Shoulder adduction      Shoulder extension      Shoulder internal rotation      Shoulder external rotation      Middle  trapezius      Lower trapezius      Elbow flexion 5/5 5/5 5/5 5/5  Elbow extension 5/5 5/5 5/5 5/5  Wrist flexion 4/5 5/5 4-/5 5/5  Wrist extension 4/5 5/5 4-/5 5/5  Wrist ulnar deviation      Wrist radial deviation      Wrist pronation      Wrist supination      (Blank rows = not tested)  HAND FUNCTION: Grip strength: Right: 39 lbs; Left: 28 lbs, Lateral pinch: Right: 11 lbs, Left: 9 lbs, and 3 point pinch: Right: 9 lbs, Left: 9 lbs  01/29/24 Grip strength: Right: 39 lbs; Left: 36 lbs, Lateral pinch: Right: 14 lbs, Left: 12 lbs, and 3 point pinch: Right: 12 lbs, Left: 11 lbs   COORDINATION: 9 Hole Peg test: Right: 30 sec; Left: 36 sec  01/29/24 9 Hole Peg test: Right: 24 sec; Left: 23 sec   SENSATION: WFL  EDEMA:   MUSCLE TONE:   COGNITION: Overall cognitive  status: Within functional limits for tasks assessed  VISION: Subjective report: Pt. reports changes in her vision at onset of stroke, starting with blurry vision, resulting in vision loss in the R eye.  Baseline vision: Pt. Is able to visually track  Visual history: Hx of eye surgery  VISION ASSESSMENT: TBD  PERCEPTION: WFL  PRAXIS: WFL  OBSERVATIONS:                                                                                                                    TREATMENT DATE: 03/28/24   Therapeutic Activities:   -Pt. completed tasks focused on promoting visual scanning, and visual search strategies at the tabletop locating specific items among many different objects on a single wide sheet of paper. Pt. omitted 4 items the first time through the task, however with cues Pt. was able to accurately complete the remaining items  2nd time through. -Visual memory: Pt. Was able to recall 4 of 14 items after completing the visual scanning task. Pt. Was able to recall an additional item with cues.  -Pt. worked on [employer] using flip flop targets, formulating mirror images of designs in multiple positions. Pt. was able to complete simple designs, however  had more difficulty as the degree of complexity increased.  HOME EXERCISE PROGRAM:  -Visual perceptual: Flip flop target design patterns. -Visual memory tasks -Writing tasks -visual motor tasks.  GOALS: Goals reviewed with patient? Yes  SHORT TERM GOALS: Target date: 04/17/2024    Pt. Will independently utilize HEP for hand strength, coordination, and visual compensatory strategies ADL/ADLs Baseline: 03/06/24: Independent Eval: No  current HEP  Goal status: Ongoing   LONG TERM GOALS: Target date: 05/29/2024   Pt. Will be able to independently implement visual scanning/visual search compensatory strategies for tasks within her extra personal space navigating through community environments 100% of the time.   Baseline: 01/29/24:  Independent  100% of the time within the therapy gym, and hallway. Eval: Pt. Requires increased assist  to navigate through community environments. Goal status: Achieved  2.  Pt. Will be able to independently utilize visual scanning/visual search strategies  during ADLS/IADL within her near space, and during tabletop top tasks. 100% of the time.  Baseline: 03/06/24: Initiates visual scanning 85% of the time  01/29/24: 75% Eval: Pt. Education to be provided.  Goal status: INITIAL  3.  Pt. Will be able to independently initiate visual scanning techniques in her own environment to reduce risk of falls.  Baseline: 01/29/24: Education was provided, Pt. Is utlizing visual compensatory strategies/visual scanning with in her home. Eval: Pt. Education to be provided.  Goal status: Achieved  4.  Pt. Will increase BUE Grip Strength by 5# to be able to independently hold objects for ADL/IADL use. Baseline: 01/29/24: Grip strength: Right: 39 lbs; Left: 28 lbs, Eval: Right Grip: 39#, Left Grip: 28# Goal status: Achieved  5.  Pt. Will increase L Lateral Pinch strength by 2# to be able to independently twist off bottle caps/lids. Baseline: 01/29/24: Lateral pinch: Right: 14 lbs, Left: 12 lbs Eval: Lateral pinch: Right: 11 lbs, Left: 9 lbs  Goal status: Achieved  6.  Pt. Will increase BUE strength for shoulder flexion/abduction by 2 mm grades to independently complete hair care tasks. Baseline: 01/29/24: 5/5 overall Eval: Right Shoulder Flexion: 4-/5, Left Shoulder Flexion:4-/5, Right Shoulder Abduction: 4-/5, Left Shoulder Abduction: 4-/5 Goal status: Achieved  7.  Pt will be indep with medication set up using weekly pill organizer.  Baseline: 01/29/24: Independent 01/15/24: Daughter currently manages medication set up.  Goal status: Achieved  8. Pt will increase typing speed to 20-30 words per minute with at least 90% accuracy to work towards more efficient typing for job  related  responsibilities.  Baseline: 03/06/24: 10 wpm with 92% accuracy. 01/29/24: Continue 01/15/24: 8wpm x88% accuracy=7 wpm  Goal status: Progressing/Ongoing  9. Pt will be able to scan small print on store receipts to check for errors with 100% accuracy using visual compensation strategies as needed.  Baseline: 03/06/24: Pt. Continues to present with difficulty with visual scanning small  small items efficiently. 01/29/24: Pt. Continues to have difficulty scanning small print items. 01/15/24: Not yet attempted; required for return to work/job responsibility  Goal status:  Ongoing  10. Pt. will independently identify 8 items consistently from visual visual memory in preparation or ADLs.    Baseline: 03/06/24: Pt. is able to identify up to 6 picture items while seated at the tabletop. Pt. is able to consistently Identify 4 letters from visual memory when attention is divided.  01/29/24: Pt. is able to identify 6 items consistently from visual memory.    Gaol status: Ongoing      11. Pt. will be independently write 4 sentences  efficiently with 100% legibility, no deviation from writing on a blank line, and appropriate spacing between letters.              Baseline: 03/06/24: 4 lines with 75% legibility, positive deviation below the line, and excessive spacing between the words.              Goal status: Ongoing  ASSESSMENT:  CLINICAL IMPRESSION:  Pt. omitted 4 of the  items on the visual scanning task, however after cues was able to complete 100% of the items. Pt. presents with difficulty completing moderately complex, and complex visual perceptual flip-flop target design patterns requiring increased cuing to complete. Pt. Was able to recall 4/14 items from visual memory after completing the visual scanning  task.  Pt. continues to benefit from Occupational Therapy services to improve her ability to use visual compensatory strategies, and improve overall BUE functioning in order to improve engagement  in, and maximize overall independence with ADL, and IADL tasks.    PERFORMANCE DEFICITS: in functional skills including ADLs, IADLs, coordination, dexterity, Fine motor control, and vision, and psychosocial skills including coping strategies, environmental adaptation, habits, interpersonal interactions, and routines and behaviors.   IMPAIRMENTS: are limiting patient from ADLs, IADLs, rest and sleep, work, leisure, and social participation.   CO-MORBIDITIES: may have co-morbidities  that affects occupational performance. Patient will benefit from skilled OT to address above impairments and improve overall function.  MODIFICATION OR ASSISTANCE TO COMPLETE EVALUATION: Min-Moderate modification of tasks or assist with assess necessary to complete an evaluation.  OT OCCUPATIONAL PROFILE AND HISTORY: Detailed assessment: Review of records and additional review of physical, cognitive, psychosocial history related to current functional performance.  CLINICAL DECISION MAKING: Moderate - several treatment options, min-mod task modification necessary  REHAB POTENTIAL: Good  EVALUATION COMPLEXITY: Moderate    PLAN:  OT FREQUENCY: 2x/week  OT DURATION: 12 weeks  PLANNED INTERVENTIONS: 97168 OT Re-evaluation, 97535 self care/ADL training, 02889 therapeutic exercise, 97530 therapeutic activity, 97112 neuromuscular re-education, visual/perceptual remediation/compensation, patient/family education, and DME and/or AE instructions  RECOMMENDED OTHER SERVICES: ST  CONSULTED AND AGREED WITH PLAN OF CARE: Patient and family member/caregiver  PLAN FOR NEXT SESSION: Treatment  Richardson Otter, MS, OTR/L 03/28/24

## 2024-03-28 NOTE — Therapy (Signed)
 OUTPATIENT SPEECH LANGUAGE PATHOLOGY  TREATMENT / PROGRESS NOTE   Patient Name: Ashley Collins MRN: [id number] DOB:1960-10-28, 63 y.o., female Today's Date: 03/28/2024  PCP: Gaetana Haddock, NP  REFERRING PROVIDER: same  Speech Therapy Progress Note  Dates of Reporting Period: 02/20/24 to 03/28/24  Objective: Patient has been seen for 10 speech therapy sessions this reporting period targeting cognitive-communication. Patient is making progress toward LTGs and met 3/3 STGs this reporting period. See skilled intervention, clinical impressions, and goals below for details.    End of Session - 03/28/24 1526     Visit Number 10    Number of Visits 24    Date for Recertification  05/14/24    SLP Start Time 1445    SLP Stop Time  1525    SLP Time Calculation (min) 40 min    Activity Tolerance Patient tolerated treatment well           Patient Active Problem List   Diagnosis Date Noted   Acute CVA (cerebrovascular accident) (HCC) 12/19/2023   Hypokalemia 12/19/2023   AKI (acute kidney injury) 12/19/2023   Leukocytosis 12/19/2023   Hyperlipidemia, unspecified 12/19/2023   Essential hypertension 12/19/2023   Vision changes 12/19/2023   Lymphedema 05/22/2023   Chronic venous insufficiency 05/22/2023   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 replacement therapy 04/10/2023   Insomnia due to medical condition 04/25/2022   GAD (generalized anxiety disorder) 02/02/2022   Other specified depressive episodes 02/02/2022   Long-term current use of benzodiazepine 02/02/2022   Migraine with aura and without status migrainosus, not intractable 11/03/2020   Arrhythmia 08/06/2020   Status migrainosus 02/25/2019   Osteoporosis 04/09/2013   Vitamin D deficiency 04/09/2013   DDD (degenerative disc disease), lumbosacral 02/08/2005    ONSET DATE: 12/19/23   REFERRING DIAG: CVA- memory deficits  THERAPY DIAG:  Cognitive communication deficit  Rationale for Evaluation and Treatment  Rehabilitation  SUBJECTIVE:   SUBJECTIVE STATEMENT: Pt alert, pleasant, and cooperative. Pt accompanied by: self and significant other  PERTINENT HISTORY & DIAGNOSTIC FINDINGS: Pt is 63 y.o. female who presents today for a cognitive-communication evaluation in setting of stroke. MRI 12/18/23 1. 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 No associated hemorrhage or mass effect. PMHx as outlined above.  PAIN:  Are you having pain? No   FALLS: Has patient fallen in last 6 months?  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   PATIENT GOALS  to return to PLOF   OBJECTIVE:  TODAY'S TREATMENT: Reviewed concept of use of journaling to improve short term and prospective memory. Pt has been journaling to improve short term and prospective memory.  Pt continues schedule day utilizing calendar as if she was at work with consideration for priority tasks and building in rest breaks. Pt has now started color coding tasks for easier access. Pt also removed excess apps/organized home screen for easier scanning/access to information. Reviewed types of memory and external memory strategies. Pt has been using calendar on iPhone. Pt utilized internal memory strategies to recall names/faces from previous sessions. Reviewed executive functioning and metacognitive strategies for improved executive functioning. Pt utilized with min A to complete a deductive reasoning puzzle. Additional puzzles given for HEP.   PATIENT EDUCATION: Education details: as above Person educated: Patient and Spouse Education method: Explanation; Handout Education comprehension:  verbalized understanding  HOME EXERCISE PROGRAM:        Practice narrating actions  Telling self  to mentally focus  Continue calendar use, blocking schedule, and journaling  Deductive reasoning puzzle      GOALS:  Goals reviewed with patient? Yes  SHORT TERM GOALS: Target date: 10 sessions  Pt will complete PROM re: memory.  Baseline: Goal status: MET   2.  Pt will endorse successful implementation of at least x2 compensations for attention and memory.  Baseline:  Goal status: MET  3.  With Moderate A, patient will establish external aid for memory/executive function and bring to more than 50% of therapy sessions.    Baseline:  Goal status: MET    LONG TERM GOALS: Target date: 12 weeks  Pt will endorse improvement in cognitive-communication per PROM.  Baseline:  Goal status: PROGRESSING  2.  Pt and/or husband will demonstrate understanding of ways to promote and support cognitive-communication outside of SLP sessions.  Baseline:  Goal status: PROGRESSING   ASSESSMENT:  CLINICAL IMPRESSION:  Pt is 63 y.o. female who presents today for a cognitive-communication treatment in setting of stroke. Initial assessment completed via formal means (Cognitive-Linguistic Quick Test) and PROM (Neuro-QoL Adult Cognitive Function v2.0). Pt presents with at least mild cognitive-communication deficits affecting attention and visuospatial skills per CLQT as well as memory and executive functioning per PROM. Suspect CLQT is not as sensitive to higher level cognitive-communication deficits appreciated by pt during iADLs. See details of today's tx as outlined above. Recommend skilled ST services targeting above mentioned deficits to improve QoL and performance on ADLs/IADLs.  OBJECTIVE IMPAIRMENTS include attention, memory, executive functioning, and visuospatial deficits. These impairments are limiting patient from return to work, managing appointments, household responsibilities, and ADLs/IADLs. Factors affecting potential to achieve goals and functional outcome are medical prognosis..  Patient will benefit from skilled SLP services to address above impairments and improve overall function.  REHAB POTENTIAL: Good  PLAN: SLP FREQUENCY: 2x/week  SLP DURATION: 12 weeks  PLANNED INTERVENTIONS: Cueing hierachy, Cognitive reorganization, Internal/external aids, Functional tasks, SLP instruction and feedback, Compensatory strategies, and Patient/family education    Delon Bangs, M.S., CCC-SLP Speech-Language Pathologist [place] [facility] [phone] [place])  [facility] [address] [place], [place], [address] Phone: [phone]   Fax:  [phone]

## 2024-04-02 ENCOUNTER — Ambulatory Visit
Admission: RE | Admit: 2024-04-02 | Discharge: 2024-04-02 | Disposition: A | Discharge status: Home / Self Care | Source: Ambulatory Visit | Attending: Physician Assistant | Admitting: Physician Assistant

## 2024-04-02 ENCOUNTER — Ambulatory Visit: Admitting: Physical Therapy

## 2024-04-02 ENCOUNTER — Ambulatory Visit

## 2024-04-02 ENCOUNTER — Ambulatory Visit: Admitting: Physician Assistant

## 2024-04-02 ENCOUNTER — Ambulatory Visit: Admitting: Occupational Therapy

## 2024-04-02 VITALS — BP 131/78 | HR 93

## 2024-04-02 DIAGNOSIS — R3989 Other symptoms and signs involving the genitourinary system: Secondary | ICD-10-CM

## 2024-04-02 DIAGNOSIS — R41841 Cognitive communication deficit: Secondary | ICD-10-CM

## 2024-04-02 DIAGNOSIS — M6281 Muscle weakness (generalized): Secondary | ICD-10-CM

## 2024-04-02 LAB — BLADDER SCAN AMB NON-IMAGING: Scan Result: 1 mL

## 2024-04-02 MED ORDER — SOLIFENACIN SUCCINATE 5 MG PO TABS: 5.0000 mg | ORAL_TABLET | Freq: Every day | ORAL | 11 refills | Status: AC

## 2024-04-02 NOTE — Therapy (Addendum)
 Occupational Therapy Progress Note   Patient Name: Ashley Collins MRN: [id number] DOB:14-May-1961, 63 y.o., female Today's Date: 04/02/2024  PCP: Harvey Gaetana CROME, NP REFERRING PROVIDER: Harvey Gaetana CROME, NP   OT End of Session - 04/02/24 1151     Visit Number 27    Number of Visits 48    Date for Recertification  05/29/24    OT Start Time 1005    OT Stop Time 1050    OT Time Calculation (min) 45 min    Activity Tolerance Patient tolerated treatment well    Behavior During Therapy WFL for tasks assessed/performed              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   Cardiac arrhythmia    Nonspecific ST T wave changes on EKG   Chronic venous insufficiency of lower extremity    Complication of anesthesia    nausea and vomiting   DDD (degenerative disc disease), lumbosacral    Essential hypertension    Headache    History of kidney stones    Hyperlipidemia    Kidney stones    Lymphedema    Migraines    Osteoporosis    PONV (postoperative nausea and vomiting)    Right ureteral stone    Vitamin B12 deficiency    Vitamin D deficiency    Past Surgical History:  Procedure Laterality Date   AUGMENTATION MAMMAPLASTY     CESAREAN SECTION     x 4   COLONOSCOPY WITH PROPOFOL  N/A 03/31/2022   Procedure: COLONOSCOPY WITH PROPOFOL ;  Surgeon: Onita Elspeth Sharper, DO;  Location: [facility] ENDOSCOPY;  Service: Gastroenterology;  Laterality: N/A;   CYSTOSCOPY W/ RETROGRADES Bilateral 07/10/2020   Procedure: CYSTOSCOPY WITH RETROGRADE PYELOGRAM;  Surgeon: Francisca Redell BROCKS, MD;  Location: [facility] ORS;  Service: Urology;  Laterality: Bilateral;   CYSTOSCOPY/URETEROSCOPY/HOLMIUM LASER/STENT PLACEMENT     CYSTOSCOPY/URETEROSCOPY/HOLMIUM LASER/STENT PLACEMENT Bilateral 07/10/2020   Procedure: CYSTOSCOPY/URETEROSCOPY/HOLMIUM LASER/STENT PLACEMENT;  Surgeon: Francisca Redell BROCKS, MD;  Location: [facility] ORS;  Service: Urology;   Laterality: Bilateral;   CYSTOSCOPY/URETEROSCOPY/HOLMIUM LASER/STENT PLACEMENT Right 08/25/2023   Procedure: CYSTOSCOPY/URETEROSCOPY/HOLMIUM LASER;  Surgeon: Francisca Redell BROCKS, MD;  Location: [facility] ORS;  Service: Urology;  Laterality: Right;   CYSTOSCOPY/URETEROSCOPY/HOLMIUM LASER/STENT PLACEMENT Right 12/15/2023   Procedure: CYSTOSCOPY/URETEROSCOPY/HOLMIUM LASER;  Surgeon: Francisca Redell BROCKS, MD;  Location: [facility] ORS;  Service: Urology;  Laterality: Right;   EXTRACORPOREAL SHOCK WAVE LITHOTRIPSY     x 10 plus   Eye Lift     FACIAL COSMETIC SURGERY     GANGLION CYST EXCISION Right 12/21/2021   Procedure: REMOVAL GANGLION OF WRIST;  Surgeon: Kathlynn Sharper, MD;  Location: [facility] ORS;  Service: Orthopedics;  Laterality: Right;   LIPOSUCTION     TONSILLECTOMY     TRIGGER FINGER RELEASE Right 12/21/2021   Procedure: RELEASE TRIGGER FINGER/A-1 PULLEY;  Surgeon: Kathlynn Sharper, MD;  Location: [facility] ORS;  Service: Orthopedics;  Laterality: Right;   URETEROSCOPY WITH HOLMIUM LASER LITHOTRIPSY     Patient Active Problem List   Diagnosis Date Noted   Acute CVA (cerebrovascular accident) (HCC) 12/19/2023   Hypokalemia 12/19/2023   AKI (acute kidney injury) 12/19/2023   Leukocytosis 12/19/2023   Hyperlipidemia, unspecified 12/19/2023   Essential hypertension 12/19/2023   Vision changes 12/19/2023   Lymphedema 05/22/2023   Chronic venous insufficiency 05/22/2023   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 replacement therapy 04/10/2023   Insomnia due  to medical condition 04/25/2022   GAD (generalized anxiety disorder) 02/02/2022   Other specified depressive episodes 02/02/2022   Long-term current use of benzodiazepine 02/02/2022   Migraine with aura and without status migrainosus, not intractable 11/03/2020   Arrhythmia 08/06/2020   Status migrainosus 02/25/2019   Osteoporosis 04/09/2013   Vitamin D deficiency 04/09/2013   DDD (degenerative disc disease), lumbosacral 02/08/2005   ONSET DATE:  12/18/2023  REFERRING DIAG:   THERAPY DIAG:  Muscle weakness (generalized)  Rationale for Evaluation and Treatment: Rehabilitation  SUBJECTIVE:  SUBJECTIVE STATEMENT: 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  Pt accompanied by: significant other  PERTINENT HISTORY: Pt. has Hx of stroke with onset 12/18/2023. Pt. PMHx includes: HTN, Hyperlipidemia, Kidney Stones, near syncopal event, loss of vision.  PRECAUTIONS: None  WEIGHT BEARING RESTRICTIONS: None  PAIN:  Are you having pain? No  FALLS: Has patient fallen in last 6 months? Yes. Number of falls    LIVING ENVIRONMENT: Lives with: lives with their family Lives in: [place], [place] Stairs: No, inside the house, yes but does not use Has following equipment at home:   PLOF: Independent  PATIENT GOALS: To be able to see  OBJECTIVE:  Note: Objective measures were completed at Evaluation unless otherwise noted.  HAND DOMINANCE: Right  ADLs:  Eating: Drinking from straw is different, able to use utensils.  Grooming: Fatigues completing hair care.  UB Dressing: independent, if clothes are in front of her she can find clothing LB Dressing: Independent Toileting: Independent Bathing: Independent Tub Shower transfers: Independent Equipment: none Has difficulty with using cell phone  IADLs: Shopping: Does not typically go out shopping, and has not tried to. Light housekeeping: Pt. reports that she does not typically do house cleaning. Pt. has been able to unpack belongings from boxes due to her recent move in. Meal Prep: Pt. reports that is does not currently cook, however reports that she probably could. Has difficulty opening bottle caps/lids. Community mobility: Requires assistance from husband to navigate  through buildings, negotiate stairs-Pt. With increased fear of falling  Medication management: Able to push down medication bottles to open   Financial management: No change in the process-uses automatic bill pay system.  Has difficulty with using cell phone Handwriting: N/T Work: Pt. was actively working managing a Health visitor, works a lot of hours  MOBILITY STATUS: Independent and Needs Assist: Requires hand on hand assistance with nvigating through environments.   POSTURE COMMENTS:  No Significant postural limitations Sitting balance: Good  ACTIVITY TOLERANCE: Activity tolerance: Good  FUNCTIONAL OUTCOME MEASURES:   UPPER EXTREMITY ROM:    Active ROM Right Eval WFL Left Eval [facility]  Shoulder flexion    Shoulder abduction    Shoulder adduction    Shoulder extension    Shoulder internal rotation    Shoulder external rotation    Elbow flexion    Elbow extension    Wrist flexion    Wrist extension    Wrist ulnar deviation    Wrist radial deviation    Wrist pronation    Wrist supination    (Blank rows = not tested)  UPPER EXTREMITY MMT:     MMT Right eval Right Left eval Left  Shoulder flexion 4-/5 5/5 4-/5 5/5  Shoulder abduction 4-/5 5/5 4-/5 5/5  Shoulder adduction      Shoulder extension      Shoulder internal rotation  Shoulder external rotation      Middle trapezius      Lower trapezius      Elbow flexion 5/5 5/5 5/5 5/5  Elbow extension 5/5 5/5 5/5 5/5  Wrist flexion 4/5 5/5 4-/5 5/5  Wrist extension 4/5 5/5 4-/5 5/5  Wrist ulnar deviation      Wrist radial deviation      Wrist pronation      Wrist supination      (Blank rows = not tested)  HAND FUNCTION: Grip strength: Right: 39 lbs; Left: 28 lbs, Lateral pinch: Right: 11 lbs, Left: 9 lbs, and 3 point pinch: Right: 9 lbs, Left: 9 lbs  01/29/24 Grip strength: Right: 39 lbs; Left: 36 lbs, Lateral pinch: Right: 14 lbs, Left: 12 lbs, and 3 point pinch: Right: 12 lbs, Left: 11 lbs   COORDINATION: 9 Hole Peg test: Right: 30 sec; Left: 36 sec  01/29/24 9 Hole Peg test: Right: 24 sec; Left: 23  sec   SENSATION: WFL  EDEMA:   MUSCLE TONE:   COGNITION: Overall cognitive status: Within functional limits for tasks assessed  VISION: Subjective report: Pt. reports changes in her vision at onset of stroke, starting with blurry vision, resulting in vision loss in the R eye.  Baseline vision: Pt. Is able to visually track  Visual history: Hx of eye surgery  VISION ASSESSMENT: TBD  PERCEPTION: WFL  PRAXIS: WFL  OBSERVATIONS:                                                                                                                    TREATMENT DATE: 04/02/24   Therapeutic Activities:   -sequencing, and visual saccades tasks left to right, and right to left without using an anchor. -Facilitated visual saccades verbalizing numbers from left to right.  -Facilitated visual, and wording sequencing skills single, and multi-step directions. -Facilitated visual perceptual skills using flip flop targets, formulating mirror images of designs in multiple positions. Pt. was able to complete simple designs, however  had more difficulty as the degree of complexity increased.  HOME EXERCISE PROGRAM:  -Visual perceptual: Flip flop target design patterns. -Visual memory tasks -Writing tasks -visual motor tasks.  GOALS: Goals reviewed with patient? Yes  SHORT TERM GOALS: Target date: 04/17/2024    Pt. Will independently utilize HEP for hand strength, coordination, and visual compensatory strategies ADL/ADLs Baseline: 03/06/24: Independent Eval: No  current HEP  Goal status: Ongoing   LONG TERM GOALS: Target date: 05/29/2024   Pt. Will be able to independently implement visual scanning/visual search compensatory strategies for tasks within her extra personal space navigating through community environments 100% of the time.  Baseline: 01/29/24:  Independent  100% of the time within the therapy gym, and hallway. Eval: Pt. Requires increased assist to navigate through community  environments. Goal status: Achieved  2.  Pt. Will be able to independently utilize visual scanning/visual search strategies  during ADLS/IADL within her near space, and during tabletop top tasks. 100% of the time.  Baseline:  03/06/24: Initiates visual scanning 85% of the time  01/29/24: 75% Eval: Pt. Education to be provided.  Goal status: INITIAL  3.  Pt. Will be able to independently initiate visual scanning techniques in her own environment to reduce risk of falls.  Baseline: 01/29/24: Education was provided, Pt. Is utlizing visual compensatory strategies/visual scanning with in her home. Eval: Pt. Education to be provided.  Goal status: Achieved  4.  Pt. Will increase BUE Grip Strength by 5# to be able to independently hold objects for ADL/IADL use. Baseline: 01/29/24: Grip strength: Right: 39 lbs; Left: 28 lbs, Eval: Right Grip: 39#, Left Grip: 28# Goal status: Achieved  5.  Pt. Will increase L Lateral Pinch strength by 2# to be able to independently twist off bottle caps/lids. Baseline: 01/29/24: Lateral pinch: Right: 14 lbs, Left: 12 lbs Eval: Lateral pinch: Right: 11 lbs, Left: 9 lbs  Goal status: Achieved  6.  Pt. Will increase BUE strength for shoulder flexion/abduction by 2 mm grades to independently complete hair care tasks. Baseline: 01/29/24: 5/5 overall Eval: Right Shoulder Flexion: 4-/5, Left Shoulder Flexion:4-/5, Right Shoulder Abduction: 4-/5, Left Shoulder Abduction: 4-/5 Goal status: Achieved  7.  Pt will be indep with medication set up using weekly pill organizer.  Baseline: 01/29/24: Independent 01/15/24: Daughter currently manages medication set up.  Goal status: Achieved  8. Pt will increase typing speed to 20-30 words per minute with at least 90% accuracy to work towards more efficient typing for job related  responsibilities.  Baseline: 03/06/24: 10 wpm with 92% accuracy. 01/29/24: Continue 01/15/24: 8wpm x88% accuracy=7 wpm  Goal status: Progressing/Ongoing  9. Pt  will be able to scan small print on store receipts to check for errors with 100% accuracy using visual compensation strategies as needed.  Baseline: 03/06/24: Pt. Continues to present with difficulty with visual scanning small  small items efficiently. 01/29/24: Pt. Continues to have difficulty scanning small print items. 01/15/24: Not yet attempted; required for return to work/job responsibility  Goal status:  Ongoing  10. Pt. will independently identify 8 items consistently from visual visual memory in preparation or ADLs.    Baseline: 03/06/24: Pt. is able to identify up to 6 picture items while seated at the tabletop. Pt. is able to consistently Identify 4 letters from visual memory when attention is divided.  01/29/24: Pt. is able to identify 6 items consistently from visual memory.    Gaol status: Ongoing      11. Pt. will be independently write 4 sentences  efficiently with 100% legibility, no deviation from writing on a blank line, and appropriate spacing between letters.              Baseline: 03/06/24: 4 lines with 75% legibility, positive deviation below the line, and excessive spacing between the words.              Goal status: Ongoing  ASSESSMENT:  CLINICAL IMPRESSION:  Pt. continues to present with difficulty completing sequencing tasks, as well as simple, and moderately complex visual perceptual flip-flop target design patterns requiring increased cuing to complete. Pt. required step-by step cuing, increased time, and assist to complete each task. Pt. presented with difficulty with multi-step task word sequencing tasks. Pt. continues to benefit from Occupational Therapy services to improve her ability to use visual compensatory strategies, and improve overall BUE functioning in order to improve engagement in, and maximize overall independence with ADL, and IADL tasks.    PERFORMANCE DEFICITS: in functional skills including ADLs, IADLs, coordination, dexterity,  Fine motor control, and  vision, and psychosocial skills including coping strategies, environmental adaptation, habits, interpersonal interactions, and routines and behaviors.   IMPAIRMENTS: are limiting patient from ADLs, IADLs, rest and sleep, work, leisure, and social participation.   CO-MORBIDITIES: may have co-morbidities  that affects occupational performance. Patient will benefit from skilled OT to address above impairments and improve overall function.  MODIFICATION OR ASSISTANCE TO COMPLETE EVALUATION: Min-Moderate modification of tasks or assist with assess necessary to complete an evaluation.  OT OCCUPATIONAL PROFILE AND HISTORY: Detailed assessment: Review of records and additional review of physical, cognitive, psychosocial history related to current functional performance.  CLINICAL DECISION MAKING: Moderate - several treatment options, min-mod task modification necessary  REHAB POTENTIAL: Good  EVALUATION COMPLEXITY: Moderate    PLAN:  OT FREQUENCY: 2x/week  OT DURATION: 12 weeks  PLANNED INTERVENTIONS: 97168 OT Re-evaluation, 97535 self care/ADL training, 02889 therapeutic exercise, 97530 therapeutic activity, 97112 neuromuscular re-education, visual/perceptual remediation/compensation, patient/family education, and DME and/or AE instructions  RECOMMENDED OTHER SERVICES: ST  CONSULTED AND AGREED WITH PLAN OF CARE: Patient and family member/caregiver  PLAN FOR NEXT SESSION: Treatment  Richardson Otter, MS, OTR/L 03/28/24

## 2024-04-02 NOTE — Progress Notes (Signed)
04/02/2024 4:38 PM   Ashley Collins January 08, 1961 [id number]  CC: Chief Complaint  Patient presents with   Establish Care   HPI: Ashley Collins is a 63 y.o. female with PMH nephrolithiasis and CVA who presents today for evaluation of bladder pressure.   Today she reports sudden onset bladder pressure about 10 days ago.  Onset coincided with some epidural spinal injections.  She was given antibiotics by her PCP for possible UTI with no change in her symptoms.  She had some leftover Vesicare and has been taking it for the past 2 days with some mild improvement.  She states the pressure increases throughout the day with increased movement.  She has had similar episodes in the remote past while living in [place]  and had an unknown in-office procedure to address it, possible dilation.  PVR 1mL.  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   Cardiac arrhythmia    Nonspecific ST T wave changes on EKG   Chronic venous insufficiency of lower extremity    Complication of anesthesia    nausea and vomiting   DDD (degenerative disc disease), lumbosacral    Essential hypertension    Headache    History of kidney stones    Hyperlipidemia    Kidney stones    Lymphedema    Migraines    Osteoporosis    PONV (postoperative nausea and vomiting)    Right ureteral stone    Vitamin B12 deficiency    Vitamin D deficiency     Surgical History: Past Surgical History:  Procedure Laterality Date   AUGMENTATION MAMMAPLASTY     CESAREAN SECTION     x 4   COLONOSCOPY WITH PROPOFOL  N/A 03/31/2022   Procedure: COLONOSCOPY WITH PROPOFOL ;  Surgeon: Onita Elspeth Sharper, DO;  Location: [facility] ENDOSCOPY;  Service: Gastroenterology;  Laterality: N/A;   CYSTOSCOPY W/ RETROGRADES Bilateral 07/10/2020   Procedure: CYSTOSCOPY WITH RETROGRADE PYELOGRAM;  Surgeon: Francisca Redell BROCKS, MD;  Location: [facility] ORS;  Service: Urology;  Laterality:  Bilateral;   CYSTOSCOPY/URETEROSCOPY/HOLMIUM LASER/STENT PLACEMENT     CYSTOSCOPY/URETEROSCOPY/HOLMIUM LASER/STENT PLACEMENT Bilateral 07/10/2020   Procedure: CYSTOSCOPY/URETEROSCOPY/HOLMIUM LASER/STENT PLACEMENT;  Surgeon: Francisca Redell BROCKS, MD;  Location: [facility] ORS;  Service: Urology;  Laterality: Bilateral;   CYSTOSCOPY/URETEROSCOPY/HOLMIUM LASER/STENT PLACEMENT Right 08/25/2023   Procedure: CYSTOSCOPY/URETEROSCOPY/HOLMIUM LASER;  Surgeon: Francisca Redell BROCKS, MD;  Location: [facility];  Service: Urology;  Laterality: Right;   CYSTOSCOPY/URETEROSCOPY/HOLMIUM LASER/STENT PLACEMENT Right 12/15/2023   Procedure: CYSTOSCOPY/URETEROSCOPY/HOLMIUM LASER;  Surgeon: Francisca Redell BROCKS, MD;  Location: [facility];  Service: Urology;  Laterality: Right;   EXTRACORPOREAL SHOCK WAVE LITHOTRIPSY     x 10 plus   Eye Lift     FACIAL COSMETIC SURGERY     GANGLION CYST EXCISION Right 12/21/2021   Procedure: REMOVAL GANGLION OF WRIST;  Surgeon: Kathlynn Sharper, MD;  Location: [facility] ORS;  Service: Orthopedics;  Laterality: Right;   LIPOSUCTION     TONSILLECTOMY     TRIGGER FINGER RELEASE Right 12/21/2021   Procedure: RELEASE TRIGGER FINGER/A-1 PULLEY;  Surgeon: Kathlynn Sharper, MD;  Location: [facility] ORS;  Service: Orthopedics;  Laterality: Right;   URETEROSCOPY WITH HOLMIUM LASER LITHOTRIPSY      Home Medications:  Allergies as of 04/02/2024       Reactions   Percocet [oxycodone -acetaminophen ] Itching   Pt tolerates both oxycodone  and tylenol  individually         Medication List  Accurate as of April 02, 2024  4:38 PM. If you have any questions, ask your nurse or doctor.          STOP taking these medications    Ajovy 225 MG/1.5ML Soaj Generic drug: Fremanezumab-vfrm Stopped by: Lucie Hones       TAKE these medications    amitriptyline  75 MG tablet Commonly known as: ELAVIL  Take 75 mg by mouth at bedtime.   aspirin  EC 81 MG tablet Take 1 tablet (81 mg total) by mouth daily. Swallow  whole.   atorvastatin  40 MG tablet Commonly known as: LIPITOR Take 1 tablet (40 mg total) by mouth daily.   Biotin 10 MG Caps Take by mouth.   BOTOX IM Inject 1 Dose into the muscle every 3 (three) months.   LORazepam  0.5 MG tablet Commonly known as: ATIVAN  Take 1 tablet (0.5 mg total) by mouth as directed. TAKE 1 TABLET 3-4 TIMES A WEEK ONLY FOR SEVERE ANXIETY ATTACKS. PLEASE LIMIT USE, 26 TABLETS MUST LAST 30 DAYS   olmesartan 5 MG tablet Commonly known as: BENICAR Take 5 mg by mouth daily.   ondansetron  8 MG disintegrating tablet Commonly known as: ZOFRAN -ODT Take 8 mg by mouth 3 (three) times daily.   zolpidem  10 MG tablet Commonly known as: AMBIEN  TAKE 1 TABLET BY MOUTH AT BEDTIME        Allergies:  Allergies  Allergen Reactions   Percocet [Oxycodone -Acetaminophen ] Itching    Pt tolerates both oxycodone  and tylenol  individually     Family History: Family History  Problem Relation Age of Onset   Drug abuse Daughter    Depression Daughter    Depression Son    Bladder Cancer Neg Hx    Kidney cancer Neg Hx    Prostate cancer Neg Hx     Social History:   reports that she has never smoked. She has never been exposed to tobacco smoke. She has never used smokeless tobacco. She reports that she does not currently use alcohol. She reports that she does not currently use drugs.  Physical Exam: BP 131/78 (BP Location: Left Arm, Patient Position: Sitting, Cuff Size: Normal)   Pulse 93   SpO2 97%   Constitutional:  Alert and oriented, no acute distress, nontoxic appearing HEENT: [place], AT Cardiovascular: No clubbing, cyanosis, or edema Respiratory: Normal respiratory effort, no increased work of breathing Skin: No rashes, bruises or suspicious lesions Neurologic: Grossly intact, no focal deficits, moving all 4 extremities Psychiatric: Normal mood and affect  Laboratory Data: Results for orders placed or performed in visit on 04/02/24  BLADDER SCAN AMB  NON-IMAGING   Collection Time: 04/02/24  4:54 PM  Result Value Ref Range   Scan Result 1 ml   Assessment & Plan:   1. Sensation of pressure in bladder area (Primary) Emptying appropriately. Question role of recent epidural injections and her sudden onset symptoms.  I asked her to leave a UA on the way out and we will contact her with results.  Will also get a KUB to rule out distal ureteral stone.  In the meantime, we will send in a new prescription for Vesicare for symptom control. - DG Abd 1 View; Future - Urinalysis, Complete - BLADDER SCAN AMB NON-IMAGING - solifenacin (VESICARE) 5 MG tablet; Take 1 tablet (5 mg total) by mouth daily.  Dispense: 30 tablet; Refill: 11   Return for Will call with results.  Lucie Hones, PA-C  [facility] [address], [address] [place], [place]  [address] [phone]  

## 2024-04-02 NOTE — Addendum Note (Signed)
 Addended by: Janaia Kozel P on: 04/02/2024 05:09 PM   Modules accepted: Orders

## 2024-04-02 NOTE — Therapy (Signed)
 OUTPATIENT SPEECH LANGUAGE PATHOLOGY  TREATMENT   Patient Name: Ashley Collins MRN: [id number] DOB:Mar 13, 1961, 63 y.o., female Today's Date: 04/02/2024  PCP: Gaetana Haddock, NP  REFERRING PROVIDER: same  Speech Therapy Progress Note  Dates of Reporting Period: 02/20/24 to 03/28/24  Objective: Patient has been seen for 10 speech therapy sessions this reporting period targeting cognitive-communication. Patient is making progress toward LTGs and met 3/3 STGs this reporting period. See skilled intervention, clinical impressions, and goals below for details.    End of Session - 04/02/24 0925     Visit Number 11    Number of Visits 24    Date for Recertification  05/14/24    SLP Start Time 0915    SLP Stop Time  1005    SLP Time Calculation (min) 50 min    Activity Tolerance Patient tolerated treatment well           Patient Active Problem List   Diagnosis Date Noted   Acute CVA (cerebrovascular accident) (HCC) 12/19/2023   Hypokalemia 12/19/2023   AKI (acute kidney injury) 12/19/2023   Leukocytosis 12/19/2023   Hyperlipidemia, unspecified 12/19/2023   Essential hypertension 12/19/2023   Vision changes 12/19/2023   Lymphedema 05/22/2023   Chronic venous insufficiency 05/22/2023   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 replacement therapy 04/10/2023   Insomnia due to medical condition 04/25/2022   GAD (generalized anxiety disorder) 02/02/2022   Other specified depressive episodes 02/02/2022   Long-term current use of benzodiazepine 02/02/2022   Migraine with aura and without status migrainosus, not intractable 11/03/2020   Arrhythmia 08/06/2020   Status migrainosus 02/25/2019   Osteoporosis 04/09/2013   Vitamin D deficiency 04/09/2013   DDD (degenerative disc disease), lumbosacral 02/08/2005    ONSET DATE: 12/19/23   REFERRING DIAG: CVA- memory deficits  THERAPY DIAG:  Cognitive communication deficit  Rationale for Evaluation and Treatment Rehabilitation  SUBJECTIVE:    SUBJECTIVE STATEMENT: Pt alert, pleasant, and cooperative. Pt accompanied by: self and significant other  PERTINENT HISTORY & DIAGNOSTIC FINDINGS: Pt is 63 y.o. female who presents today for a cognitive-communication evaluation in setting of stroke. MRI 12/18/23 1. 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 No associated hemorrhage or mass effect. PMHx as outlined above.  PAIN:  Are you having pain? No   FALLS: Has patient fallen in last 6 months?  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   PATIENT GOALS  to return to PLOF   OBJECTIVE:  TODAY'S TREATMENT: Reviewed executive functioning and metacognitive strategies for improved executive functioning. Pt given scheduling activity (base on her work duties). Pt completed with min cues to double check work. Additional scheduling activity given for HEP.  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 Pt endorsed feeling more fatigued than anticipated and noted getting nervous as customers formed a line and were waiting. Supportive counseling provided as it relates to cognitive-communication. Discussed possible work accommodations for when pt returns to work.  PATIENT EDUCATION: Education details: as above Person educated: Patient and Spouse Education method: Explanation; Handout Education comprehension: verbalized understanding  HOME EXERCISE PROGRAM:        Practice narrating actions  Telling self to mentally focus  Continue calendar use, blocking schedule, and journaling  Scheduling activity     GOALS:  Goals  reviewed with patient? Yes  SHORT TERM GOALS: Target date: 10 sessions  Pt will complete PROM re: memory.  Baseline: Goal status:  MET   2.  Pt will endorse successful implementation of at least x2 compensations for attention and memory.  Baseline:  Goal status: MET  3.  With Moderate A, patient will establish external aid for memory/executive function and bring to more than 50% of therapy sessions.    Baseline:  Goal status: MET    LONG TERM GOALS: Target date: 12 weeks  Pt will endorse improvement in cognitive-communication per PROM.  Baseline:  Goal status: PROGRESSING  2.  Pt and/or husband will demonstrate understanding of ways to promote and support cognitive-communication outside of SLP sessions.  Baseline:  Goal status: PROGRESSING   ASSESSMENT:  CLINICAL IMPRESSION:  Pt is 63 y.o. female who presents today for a cognitive-communication treatment in setting of stroke. Initial assessment completed via formal means (Cognitive-Linguistic Quick Test) and PROM (Neuro-QoL Adult Cognitive Function v2.0). Pt presents with at least mild cognitive-communication deficits affecting attention and visuospatial skills per CLQT as well as memory and executive functioning per PROM. Suspect CLQT is not as sensitive to higher level cognitive-communication deficits appreciated by pt during iADLs. See details of today's tx as outlined above. Recommend skilled ST services targeting above mentioned deficits to improve QoL and performance on ADLs/IADLs.  OBJECTIVE IMPAIRMENTS include attention, memory, executive functioning, and visuospatial deficits. These impairments are limiting patient from return to work, managing appointments, household responsibilities, and ADLs/IADLs. Factors affecting potential to achieve goals and functional outcome are medical prognosis.. Patient will benefit from skilled SLP services to address above impairments and improve overall function.  REHAB POTENTIAL: Good  PLAN: SLP FREQUENCY: 2x/week  SLP DURATION: 12 weeks  PLANNED INTERVENTIONS: Cueing hierachy, Cognitive reorganization,  Internal/external aids, Functional tasks, SLP instruction and feedback, Compensatory strategies, and Patient/family education    Delon Bangs, M.S., CCC-SLP Speech-Language Pathologist [facility] [phone] [place])  [facility] [address] [place], [place], [address] Phone: [phone]   Fax:  [phone]

## 2024-04-03 LAB — URINALYSIS, COMPLETE
Bilirubin, UA: NEGATIVE
Glucose, UA: NEGATIVE
Ketones, UA: NEGATIVE
Nitrite, UA: NEGATIVE
Specific Gravity, UA: 1.025 (ref 1.005–1.030)
Urobilinogen, Ur: 0.2 mg/dL (ref 0.2–1.0)
pH, UA: 6 (ref 5.0–7.5)

## 2024-04-03 LAB — MICROSCOPIC EXAMINATION: Epithelial Cells (non renal): >10 /HPF — AB (ref 0–10)

## 2024-04-04 ENCOUNTER — Ambulatory Visit

## 2024-04-04 ENCOUNTER — Ambulatory Visit: Admitting: Occupational Therapy

## 2024-04-04 ENCOUNTER — Ambulatory Visit: Admitting: Physical Therapy

## 2024-04-04 DIAGNOSIS — R41841 Cognitive communication deficit: Secondary | ICD-10-CM

## 2024-04-04 DIAGNOSIS — M6281 Muscle weakness (generalized): Secondary | ICD-10-CM

## 2024-04-04 NOTE — Therapy (Signed)
 Occupational Therapy Progress Note   Patient Name: Ashley Collins MRN: [id number] DOB:1961-05-20, 63 y.o., female Today's Date: 04/04/2024  PCP: Harvey Gaetana CROME, NP REFERRING PROVIDER: Harvey Gaetana CROME, NP   OT End of Session - 04/04/24 1409     Visit Number 28    Number of Visits 48    Date for Recertification  05/29/24    OT Start Time 1400    OT Stop Time 1445    OT Time Calculation (min) 45 min    Activity Tolerance Patient tolerated treatment well    Behavior During Therapy WFL for tasks assessed/performed              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   Cardiac arrhythmia    Nonspecific ST T wave changes on EKG   Chronic venous insufficiency of lower extremity    Complication of anesthesia    nausea and vomiting   DDD (degenerative disc disease), lumbosacral    Essential hypertension    Headache    History of kidney stones    Hyperlipidemia    Kidney stones    Lymphedema    Migraines    Osteoporosis    PONV (postoperative nausea and vomiting)    Right ureteral stone    Vitamin B12 deficiency    Vitamin D deficiency    Past Surgical History:  Procedure Laterality Date   AUGMENTATION MAMMAPLASTY     CESAREAN SECTION     x 4   COLONOSCOPY WITH PROPOFOL  N/A 03/31/2022   Procedure: COLONOSCOPY WITH PROPOFOL ;  Surgeon: Onita Elspeth Sharper, DO;  Location: [facility] ENDOSCOPY;  Service: Gastroenterology;  Laterality: N/A;   CYSTOSCOPY W/ RETROGRADES Bilateral 07/10/2020   Procedure: CYSTOSCOPY WITH RETROGRADE PYELOGRAM;  Surgeon: Francisca Redell BROCKS, MD;  Location: [facility] ORS;  Service: Urology;  Laterality: Bilateral;   CYSTOSCOPY/URETEROSCOPY/HOLMIUM LASER/STENT PLACEMENT     CYSTOSCOPY/URETEROSCOPY/HOLMIUM LASER/STENT PLACEMENT Bilateral 07/10/2020   Procedure: CYSTOSCOPY/URETEROSCOPY/HOLMIUM LASER/STENT PLACEMENT;  Surgeon: Francisca Redell BROCKS, MD;  Location: [facility] ORS;  Service: Urology;   Laterality: Bilateral;   CYSTOSCOPY/URETEROSCOPY/HOLMIUM LASER/STENT PLACEMENT Right 08/25/2023   Procedure: CYSTOSCOPY/URETEROSCOPY/HOLMIUM LASER;  Surgeon: Francisca Redell BROCKS, MD;  Location: [facility] ORS;  Service: Urology;  Laterality: Right;   CYSTOSCOPY/URETEROSCOPY/HOLMIUM LASER/STENT PLACEMENT Right 12/15/2023   Procedure: CYSTOSCOPY/URETEROSCOPY/HOLMIUM LASER;  Surgeon: Francisca Redell BROCKS, MD;  Location: [facility] ORS;  Service: Urology;  Laterality: Right;   EXTRACORPOREAL SHOCK WAVE LITHOTRIPSY     x 10 plus   Eye Lift     FACIAL COSMETIC SURGERY     GANGLION CYST EXCISION Right 12/21/2021   Procedure: REMOVAL GANGLION OF WRIST;  Surgeon: Kathlynn Sharper, MD;  Location: [facility] ORS;  Service: Orthopedics;  Laterality: Right;   LIPOSUCTION     TONSILLECTOMY     TRIGGER FINGER RELEASE Right 12/21/2021   Procedure: RELEASE TRIGGER FINGER/A-1 PULLEY;  Surgeon: Kathlynn Sharper, MD;  Location: [facility] ORS;  Service: Orthopedics;  Laterality: Right;   URETEROSCOPY WITH HOLMIUM LASER LITHOTRIPSY     Patient Active Problem List   Diagnosis Date Noted   Acute CVA (cerebrovascular accident) (HCC) 12/19/2023   Hypokalemia 12/19/2023   AKI (acute kidney injury) 12/19/2023   Leukocytosis 12/19/2023   Hyperlipidemia, unspecified 12/19/2023   Essential hypertension 12/19/2023   Vision changes 12/19/2023   Lymphedema 05/22/2023   Chronic venous insufficiency 05/22/2023   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 replacement therapy 04/10/2023   Insomnia due  to medical condition 04/25/2022   GAD (generalized anxiety disorder) 02/02/2022   Other specified depressive episodes 02/02/2022   Long-term current use of benzodiazepine 02/02/2022   Migraine with aura and without status migrainosus, not intractable 11/03/2020   Arrhythmia 08/06/2020   Status migrainosus 02/25/2019   Osteoporosis 04/09/2013   Vitamin D deficiency 04/09/2013   DDD (degenerative disc disease), lumbosacral 02/08/2005   ONSET DATE:  12/18/2023  REFERRING DIAG:   THERAPY DIAG:  Muscle weakness (generalized)  Rationale for Evaluation and Treatment: Rehabilitation  SUBJECTIVE:  SUBJECTIVE STATEMENT: Pt. reports that she  and her daughter have noticed that she is dropping more things with her right hand lately.  Pt accompanied by: significant other  PERTINENT HISTORY: Pt. has Hx of stroke with onset 12/18/2023. Pt. PMHx includes: HTN, Hyperlipidemia, Kidney Stones, near syncopal event, loss of vision.  PRECAUTIONS: None  WEIGHT BEARING RESTRICTIONS: None  PAIN:  Are you having pain? No  FALLS: Has patient fallen in last 6 months? Yes. Number of falls    LIVING ENVIRONMENT: Lives with: lives with their family Lives in: [place], [place] Stairs: No, inside the house, yes but does not use Has following equipment at home:   PLOF: Independent  PATIENT GOALS: To be able to see  OBJECTIVE:  Note: Objective measures were completed at Evaluation unless otherwise noted.  HAND DOMINANCE: Right  ADLs:  Eating: Drinking from straw is different, able to use utensils.  Grooming: Fatigues completing hair care.  UB Dressing: independent, if clothes are in front of her she can find clothing LB Dressing: Independent Toileting: Independent Bathing: Independent Tub Shower transfers: Independent Equipment: none Has difficulty with using cell phone  IADLs: Shopping: Does not typically go out shopping, and has not tried to. Light housekeeping: Pt. reports that she does not typically do house cleaning. Pt. has been able to unpack belongings from boxes due to her recent move in. Meal Prep: Pt. reports that is does not currently cook, however reports that she probably could. Has difficulty opening bottle caps/lids. Community mobility: Requires assistance from husband to navigate  through buildings, negotiate stairs-Pt. With increased fear of falling  Medication management: Able to push down medication bottles to open   Financial management: No change in the process-uses automatic bill pay system.  Has difficulty with using cell phone Handwriting: N/T Work: Pt. was actively working managing a Health visitor, works a lot of hours  MOBILITY STATUS: Independent and Needs Assist: Requires hand on hand assistance with nvigating through environments.   POSTURE COMMENTS:  No Significant postural limitations Sitting balance: Good  ACTIVITY TOLERANCE: Activity tolerance: Good  FUNCTIONAL OUTCOME MEASURES:   UPPER EXTREMITY ROM:    Active ROM Right Eval WFL Left Eval [facility]  Shoulder flexion    Shoulder abduction    Shoulder adduction    Shoulder extension    Shoulder internal rotation    Shoulder external rotation    Elbow flexion    Elbow extension    Wrist flexion    Wrist extension    Wrist ulnar deviation    Wrist radial deviation    Wrist pronation    Wrist supination    (Blank rows = not tested)  UPPER EXTREMITY MMT:     MMT Right eval Right 01/29/24 Right  04/04/24 Left eval Left 01/29/24 Left 04/04/24  Shoulder flexion 4-/5 5/5 5/5 4-/5 5/5 5/5  Shoulder abduction 4-/5 5/5 5/5 4-/5 5/5 5/5  Shoulder adduction        Shoulder extension  Shoulder internal rotation        Shoulder external rotation        Middle trapezius        Lower trapezius        Elbow flexion 5/5 5/5 5/5 5/5 5/5 5/5  Elbow extension 5/5 5/5 5/5 5/5 5/5 5/5  Wrist flexion 4/5 5/5 5/5 4-/5 5/5 5/5  Wrist extension 4/5 5/5 5/5 4-/5 5/5 5/5  Wrist ulnar deviation        Wrist radial deviation        Wrist pronation        Wrist supination        (Blank rows = not tested)  HAND FUNCTION: Grip strength: Right: 39 lbs; Left: 28 lbs, Lateral pinch: Right: 11 lbs, Left: 9 lbs, and 3 point pinch: Right: 9 lbs, Left: 9 lbs  01/29/24 Grip strength: Right: 39 lbs; Left: 36 lbs, Lateral pinch: Right: 14 lbs, Left: 12 lbs, and 3 point pinch: Right: 12 lbs, Left: 11 lbs  04/04/24:  Grip strength:  Right: 37 lbs; Left: 42 lbs    COORDINATION: 9 Hole Peg test: Right: 30 sec; Left: 36 sec  01/29/24 9 Hole Peg test: Right: 24 sec; Left: 23 sec.  04/04/24:  9 Hole Peg test: Right: 28 sec; Left: 33 sec.      SENSATION: WFL  EDEMA:   MUSCLE TONE:   COGNITION: Overall cognitive status: Within functional limits for tasks assessed  VISION: Subjective report: Pt. reports changes in her vision at onset of stroke, starting with blurry vision, resulting in vision loss in the R eye.  Baseline vision: Pt. Is able to visually track  Visual history: Hx of eye surgery  VISION ASSESSMENT: TBD  PERCEPTION: WFL  PRAXIS: WFL  OBSERVATIONS:                                                                                                                    TREATMENT DATE: 04/04/24   Measurements were obtained and goals were reviewed with the patient  Therapeutic activities:  - Right hand strengthening exercises were performed using yellow Theraputty including: gross gripping, gross digit extension, lateral, and 3pt. pinch strengthening, thumb opposition, bilateral alternating twisting motion in opposite directions with supination/pronation, rolling circular spheres within the tips of the fingers, and manipulating putty within the tips of fingers to remove small objects. Pt. Was provided with a visual handout HEP through [employer] with a video access code.   HOME EXERCISE PROGRAM:  -Yellow Theraputty exercises for  hand strengthening at bridge -Visual perceptual: Flip flop target design patterns. -Visual memory tasks -Writing tasks -visual motor tasks.  GOALS: Goals reviewed with patient? Yes  SHORT TERM GOALS: Target date: 04/17/2024    Pt. Will independently utilize HEP for hand strength, coordination, and visual compensatory strategies ADL/ADLs Baseline: 03/06/24: Independent Eval: No  current HEP  Goal status: Ongoing   LONG TERM GOALS: Target date: 05/29/2024   Pt.  Will be able to independently implement visual scanning/visual search  compensatory strategies for tasks within her extra personal space navigating through community environments 100% of the time.  Baseline: 01/29/24:  Independent  100% of the time within the therapy gym, and hallway. Eval: Pt. Requires increased assist to navigate through community environments. Goal status: Achieved  2.  Pt. Will be able to independently utilize visual scanning/visual search strategies  during ADLS/IADL within her near space, and during tabletop top tasks. 100% of the time.  Baseline: 03/06/24: Initiates visual scanning 85% of the time  01/29/24: 75% Eval: Pt. Education to be provided.  Goal status: INITIAL  3.  Pt. Will be able to independently initiate visual scanning techniques in her own environment to reduce risk of falls.  Baseline: 01/29/24: Education was provided, Pt. Is utlizing visual compensatory strategies/visual scanning with in her home. Eval: Pt. Education to be provided.  Goal status: Achieved  4.  Pt. Will increase BUE Grip Strength by 5# to be able to independently and securely hold objects for ADL/IADL use. Baseline:  04/04/2024: Grip strength: Right: 37 lbs; Left: 42 lbs patient is dropping items from her hands 01/29/24: Grip strength: Right: 39 lbs; Left: 28 lbs, Eval: Right Grip: 39#, Left Grip: 28# Goal status: 04/04/2024 revised  5.  Pt. Will increase L Lateral Pinch strength by 2# to be able to open bottles. Baseline: 04/04/2024: Grip strength: Right: 37 lbs; Left: 42 lbs 01/29/24: Lateral pinch: Right: 14 lbs, Left: 12 lbs Eval: Lateral pinch: Right: 11 lbs, Left: 9 lbs  Goal status: Achieved  6.  Pt. Will increase BUE strength for shoulder flexion/abduction by 2 mm grades to independently complete hair care tasks. Baseline: 03/16/2024: 5/5 01/29/24: 5/5 overall Eval: Right Shoulder Flexion: 4-/5, Left Shoulder Flexion:4-/5, Right Shoulder Abduction: 4-/5, Left Shoulder Abduction: 4-/5 Goal  status: Achieved  7.  Pt will be able to independently and efficiently manipulate medication without dropping them.  Baseline: 04/04/2024: 9 Hole Peg test: Right: 28 sec; Left: 33 sec. 01/29/24: Independent 01/15/24: Daughter currently manages medication set up.  Goal status: 04/04/2024 revised  8. Pt will increase typing speed to 20-30 words per minute with at least 90% accuracy to work towards more efficient typing for job related  responsibilities.  Baseline: 03/06/24: 10 wpm with 92% accuracy. 01/29/24: Continue 01/15/24: 8wpm x88% accuracy=7 wpm  Goal status: Progressing/Ongoing  9. Pt will be able to scan small print on store receipts to check for errors with 100% accuracy using visual compensation strategies as needed.  Baseline: 03/06/24: Pt. Continues to present with difficulty with visual scanning small  small items efficiently. 01/29/24: Pt. Continues to have difficulty scanning small print items. 01/15/24: Not yet attempted; required for return to work/job responsibility  Goal status:  Ongoing  10. Pt. will independently identify 8 items consistently from visual visual memory in preparation or ADLs.    Baseline: 03/06/24: Pt. is able to identify up to 6 picture items while seated at the tabletop. Pt. is able to consistently Identify 4 letters from visual memory when attention is divided.  01/29/24: Pt. is able to identify 6 items consistently from visual memory.    Gaol status: Ongoing      11. Pt. will be independently write 4 sentences  efficiently with 100% legibility, no deviation from writing on a blank line, and appropriate spacing between letters.              Baseline: 03/06/24: 4 lines with 75% legibility, positive deviation below the line, and excessive spacing between the words.  Goal status: Ongoing  ASSESSMENT:  CLINICAL IMPRESSION:  Patient reports that she has been dropping things more frequently from her right han.  Patient reports that she dropped her smoothly,  and her pillbox medication recently. Measurements were obtained for right upper extremity strength, right grip strength, and fine motor coordination.  Patient has had a decrease in right grip strength, and increase in left grip strength, as well as slower right and left fine motor coordination scores when assessed on the nine-hole peg test. Pt. goals were reviewed and revised with the patient for improving grip strength, and to being able to securely hold items in her hand and manipulate small items in her hand without dropping them. Pt. continues to benefit from Occupational Therapy services to improve her ability to use visual compensatory strategies, and improve overall BUE functioning in order to improve engagement in, and maximize overall independence with ADL, and IADL tasks.    PERFORMANCE DEFICITS: in functional skills including ADLs, IADLs, coordination, dexterity, Fine motor control, and vision, and psychosocial skills including coping strategies, environmental adaptation, habits, interpersonal interactions, and routines and behaviors.   IMPAIRMENTS: are limiting patient from ADLs, IADLs, rest and sleep, work, leisure, and social participation.   CO-MORBIDITIES: may have co-morbidities  that affects occupational performance. Patient will benefit from skilled OT to address above impairments and improve overall function.  MODIFICATION OR ASSISTANCE TO COMPLETE EVALUATION: Min-Moderate modification of tasks or assist with assess necessary to complete an evaluation.  OT OCCUPATIONAL PROFILE AND HISTORY: Detailed assessment: Review of records and additional review of physical, cognitive, psychosocial history related to current functional performance.  CLINICAL DECISION MAKING: Moderate - several treatment options, min-mod task modification necessary  REHAB POTENTIAL: Good  EVALUATION COMPLEXITY: Moderate    PLAN:  OT FREQUENCY: 2x/week  OT DURATION: 12 weeks  PLANNED INTERVENTIONS:  97168 OT Re-evaluation, 97535 self care/ADL training, 02889 therapeutic exercise, 97530 therapeutic activity, 97112 neuromuscular re-education, visual/perceptual remediation/compensation, patient/family education, and DME and/or AE instructions  RECOMMENDED OTHER SERVICES: ST  CONSULTED AND AGREED WITH PLAN OF CARE: Patient and family member/caregiver  PLAN FOR NEXT SESSION: Treatment  Richardson Otter, MS, OTR/L 03/28/24

## 2024-04-04 NOTE — Therapy (Signed)
 OUTPATIENT SPEECH LANGUAGE PATHOLOGY  TREATMENT   Patient Name: Ashley Collins MRN: [id number] DOB:29-May-1961, 63 y.o., female Today's Date: 04/04/2024  PCP: Gaetana Haddock, NP  REFERRING PROVIDER: same   End of Session - 04/04/24 1442     Visit Number 12    Number of Visits 24    Date for Recertification  05/14/24    SLP Start Time 1445    SLP Stop Time  1530    SLP Time Calculation (min) 45 min    Activity Tolerance Patient tolerated treatment well           Patient Active Problem List   Diagnosis Date Noted   Acute CVA (cerebrovascular accident) (HCC) 12/19/2023   Hypokalemia 12/19/2023   AKI (acute kidney injury) 12/19/2023   Leukocytosis 12/19/2023   Hyperlipidemia, unspecified 12/19/2023   Essential hypertension 12/19/2023   Vision changes 12/19/2023   Lymphedema 05/22/2023   Chronic venous insufficiency 05/22/2023   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 replacement therapy 04/10/2023   Insomnia due to medical condition 04/25/2022   GAD (generalized anxiety disorder) 02/02/2022   Other specified depressive episodes 02/02/2022   Long-term current use of benzodiazepine 02/02/2022   Migraine with aura and without status migrainosus, not intractable 11/03/2020   Arrhythmia 08/06/2020   Status migrainosus 02/25/2019   Osteoporosis 04/09/2013   Vitamin D deficiency 04/09/2013   DDD (degenerative disc disease), lumbosacral 02/08/2005    ONSET DATE: 12/19/23   REFERRING DIAG: CVA- memory deficits  THERAPY DIAG:  Cognitive communication deficit  Rationale for Evaluation and Treatment Rehabilitation  SUBJECTIVE:   SUBJECTIVE STATEMENT: Pt alert, pleasant, and cooperative. Pt accompanied by: self and significant other  PERTINENT HISTORY & DIAGNOSTIC FINDINGS: Pt is 63 y.o. female who presents today for a cognitive-communication evaluation in setting of stroke. MRI 12/18/23 1. 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 No associated  hemorrhage or mass effect. PMHx as outlined above.  PAIN:  Are you having pain? No   FALLS: Has patient fallen in last 6 months?  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   PATIENT GOALS  to return to PLOF   OBJECTIVE:  TODAY'S TREATMENT: Reviewed executive functioning and metacognitive strategies for improved executive functioning. Reviewed scheduling activity pt given for HEP, pt partially completed and will plan to finish over the weekend.   At pt's request, pt given with additional memory task - coding and recalling x5 pictured items. Pt required min A to code items. Pt independently recall items after 10 minute delay with distraction.  Pt expressed ongoing frustration re: CLOF and brain fog. Supportive counseling provided as appropriate. Pt noted feeling exhausted after each day. Pt doing well scheduling days and keeping up affairs, but is not scheduling breaks. Reviewed Pomodoro method and encouraged pt to schedule regular breaks throughout the day.   PATIENT EDUCATION: Education details: as above Person educated: Patient and Spouse Education method: Explanation; Handout Education comprehension: verbalized understanding  HOME EXERCISE PROGRAM:        Practice narrating actions  Telling self to mentally focus  Continue calendar use, blocking schedule, and journaling  Scheduling activity     GOALS:  Goals reviewed with patient? Yes  SHORT TERM GOALS: Target date: 10 sessions  Pt will complete PROM re: memory.  Baseline: Goal status: MET  2.  Pt will endorse successful implementation of at least x2 compensations for attention and memory.  Baseline:  Goal status: MET  3.  With Moderate A, patient will establish external aid for  memory/executive function and bring to more than 50% of therapy sessions.    Baseline:  Goal status: MET    LONG TERM GOALS: Target date: 12 weeks  Pt will endorse improvement in cognitive-communication per PROM.  Baseline:  Goal status: PROGRESSING  2.  Pt and/or husband will demonstrate understanding of ways to promote and support cognitive-communication outside of SLP sessions.  Baseline:  Goal status: PROGRESSING   ASSESSMENT:  CLINICAL IMPRESSION:  Pt is 63 y.o. female who presents today for a cognitive-communication treatment in setting of stroke. Initial assessment completed via formal means (Cognitive-Linguistic Quick Test) and PROM (Neuro-QoL Adult Cognitive Function v2.0). Pt presents with at least mild cognitive-communication deficits affecting attention and visuospatial skills per CLQT as well as memory and executive functioning per PROM. Suspect CLQT is not as sensitive to higher level cognitive-communication deficits appreciated by pt during iADLs. See details of today's tx as outlined above. Recommend skilled ST services targeting above mentioned deficits to improve QoL and performance on ADLs/IADLs.  OBJECTIVE IMPAIRMENTS include attention, memory, executive functioning, and visuospatial deficits. These impairments are limiting patient from return to work, managing appointments, household responsibilities, and ADLs/IADLs. Factors affecting potential to achieve goals and functional outcome are medical prognosis.. Patient will benefit from skilled SLP services to address above impairments and improve overall function.  REHAB POTENTIAL: Good  PLAN: SLP FREQUENCY: 2x/week  SLP DURATION: 12 weeks  PLANNED INTERVENTIONS: Cueing hierachy, Cognitive reorganization, Internal/external aids, Functional tasks, SLP instruction and feedback, Compensatory strategies, and Patient/family education    Delon Bangs, M.S., CCC-SLP Speech-Language Pathologist [place] [facility] [phone] [place])  [facility] at [facility] [address] [place], [place], [address] Phone: [phone]   Fax:  [phone]

## 2024-04-07 ENCOUNTER — Ambulatory Visit: Payer: Self-pay | Admitting: Urology

## 2024-04-08 ENCOUNTER — Ambulatory Visit

## 2024-04-08 ENCOUNTER — Ambulatory Visit: Admitting: Occupational Therapy

## 2024-04-08 DIAGNOSIS — R41841 Cognitive communication deficit: Secondary | ICD-10-CM

## 2024-04-08 DIAGNOSIS — M6281 Muscle weakness (generalized): Secondary | ICD-10-CM

## 2024-04-08 NOTE — Therapy (Signed)
 OUTPATIENT SPEECH LANGUAGE PATHOLOGY  TREATMENT   Patient Name: Ashley Collins MRN: [id number] DOB:[birth year], 63 y.o., female Today's Date: 04/08/2024  PCP: Gaetana Haddock, NP  REFERRING PROVIDER: same   End of Session - 04/08/24 1353     Visit Number 13    Number of Visits 24    Date for Recertification  05/14/24    SLP Start Time 1400    SLP Stop Time  1445    SLP Time Calculation (min) 45 min    Activity Tolerance Patient tolerated treatment well           Patient Active Problem List   Diagnosis Date Noted   Acute CVA (cerebrovascular accident) (HCC) 12/19/2023   Hypokalemia 12/19/2023   AKI (acute kidney injury) 12/19/2023   Leukocytosis 12/19/2023   Hyperlipidemia, unspecified 12/19/2023   Essential hypertension 12/19/2023   Vision changes 12/19/2023   Lymphedema 05/22/2023   Chronic venous insufficiency 05/22/2023   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 replacement therapy 04/10/2023   Insomnia due to medical condition 04/25/2022   GAD (generalized anxiety disorder) 02/02/2022   Other specified depressive episodes 02/02/2022   Long-term current use of benzodiazepine 02/02/2022   Migraine with aura and without status migrainosus, not intractable 11/03/2020   Arrhythmia 08/06/2020   Status migrainosus 02/25/2019   Osteoporosis 04/09/2013   Vitamin D deficiency 04/09/2013   DDD (degenerative disc disease), lumbosacral 02/08/2005    ONSET DATE: 12/19/23   REFERRING DIAG: CVA- memory deficits  THERAPY DIAG:  Cognitive communication deficit  Rationale for Evaluation and Treatment Rehabilitation  SUBJECTIVE:   SUBJECTIVE STATEMENT: Pt alert, pleasant, and cooperative. Pt accompanied by: self and significant other  PERTINENT HISTORY & DIAGNOSTIC FINDINGS: Pt is 63 y.o. female who presents today for a cognitive-communication evaluation in setting of stroke. MRI 12/18/23 1. 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 No associated  hemorrhage or mass effect. PMHx as outlined above.  PAIN:  Are you having pain? No   FALLS: Has patient fallen in last 6 months?  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   PATIENT GOALS  to return to PLOF   OBJECTIVE:  TODAY'S TREATMENT: Reviewed executive functioning and metacognitive strategies for improved executive functioning. Reviewed scheduling activity pt given for HEP, pt completed without difficulty. . Pt tasked with revising schedule with 2 team members off - pt completed with extra time and self-correction.   At pt's request, pt given with additional memory task - coding and recalling x6 pictured items. Pt required extra time code items. Pt independently recall items after 15 minute delay with distraction. Pt planning to try to recall for next session with use of strategies.    PATIENT EDUCATION: Education details: as above Person educated: Patient and Spouse Education method: Explanation; Handout Education comprehension: verbalized understanding  HOME EXERCISE PROGRAM:        Practice narrating actions  Telling self to mentally focus  Continue calendar use, blocking schedule, and journaling  Scheduling activity     GOALS:  Goals reviewed with patient? Yes  SHORT TERM GOALS: Target date: 10 sessions  Pt will complete PROM re: memory.  Baseline: Goal status: MET   2.  Pt will endorse successful implementation of at least x2 compensations for attention and memory.  Baseline:  Goal status: MET  3.  With Moderate A, patient will establish external aid for memory/executive function and bring to more than 50% of therapy sessions.    Baseline:  Goal status: MET    LONG TERM GOALS: Target date: 12 weeks  Pt will endorse improvement  in cognitive-communication per PROM.  Baseline:  Goal status: PROGRESSING  2.  Pt and/or husband will demonstrate understanding of ways to promote and support cognitive-communication outside of SLP sessions.  Baseline:  Goal status: PROGRESSING   ASSESSMENT:  CLINICAL IMPRESSION:  Pt is 63 y.o. female who presents today for a cognitive-communication treatment in setting of stroke. Initial assessment completed via formal means (Cognitive-Linguistic Quick Test) and PROM (Neuro-QoL Adult Cognitive Function v2.0). Pt presents with at least mild cognitive-communication deficits affecting attention and visuospatial skills per CLQT as well as memory and executive functioning per PROM. Suspect CLQT is not as sensitive to higher level cognitive-communication deficits appreciated by pt during iADLs. See details of today's tx as outlined above. Recommend skilled ST services targeting above mentioned deficits to improve QoL and performance on ADLs/IADLs.  OBJECTIVE IMPAIRMENTS include attention, memory, executive functioning, and visuospatial deficits. These impairments are limiting patient from return to work, managing appointments, household responsibilities, and ADLs/IADLs. Factors affecting potential to achieve goals and functional outcome are medical prognosis.. Patient will benefit from skilled SLP services to address above impairments and improve overall function.  REHAB POTENTIAL: Good  PLAN: SLP FREQUENCY: 2x/week  SLP DURATION: 12 weeks  PLANNED INTERVENTIONS: Cueing hierachy, Cognitive reorganization, Internal/external aids, Functional tasks, SLP instruction and feedback, Compensatory strategies, and Patient/family education    Delon Bangs, M.S., CCC-SLP Speech-Language Pathologist [place] [facility] [phone] [place])  [place] [facility] Outpatient Rehabilitation at [facility] [address] [place], [place], [address] Phone:  [phone]   Fax:  [phone]

## 2024-04-08 NOTE — Therapy (Signed)
 Occupational Therapy Progress Note   Patient Name: Ashley Collins MRN: [id number] DOB:October 21, 1960, 63 y.o., female Today's Date: 04/08/2024  PCP: Harvey Gaetana CROME, NP REFERRING PROVIDER: Harvey Gaetana CROME, NP   OT End of Session - 04/08/24 1553     Visit Number 29    Number of Visits 48    Date for Recertification  05/29/24    OT Start Time 1315    OT Stop Time 1400    OT Time Calculation (min) 45 min    Activity Tolerance Patient tolerated treatment well    Behavior During Therapy WFL for tasks assessed/performed              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   Cardiac arrhythmia    Nonspecific ST T wave changes on EKG   Chronic venous insufficiency of lower extremity    Complication of anesthesia    nausea and vomiting   DDD (degenerative disc disease), lumbosacral    Essential hypertension    Headache    History of kidney stones    Hyperlipidemia    Kidney stones    Lymphedema    Migraines    Osteoporosis    PONV (postoperative nausea and vomiting)    Right ureteral stone    Vitamin B12 deficiency    Vitamin D deficiency    Past Surgical History:  Procedure Laterality Date   AUGMENTATION MAMMAPLASTY     CESAREAN SECTION     x 4   COLONOSCOPY WITH PROPOFOL  N/A 03/31/2022   Procedure: COLONOSCOPY WITH PROPOFOL ;  Surgeon: Onita Elspeth Sharper, DO;  Location: [facility];  Service: Gastroenterology;  Laterality: N/A;   CYSTOSCOPY W/ RETROGRADES Bilateral 07/10/2020   Procedure: CYSTOSCOPY WITH RETROGRADE PYELOGRAM;  Surgeon: Francisca Redell BROCKS, MD;  Location: [facility];  Service: Urology;  Laterality: Bilateral;   CYSTOSCOPY/URETEROSCOPY/HOLMIUM LASER/STENT PLACEMENT     CYSTOSCOPY/URETEROSCOPY/HOLMIUM LASER/STENT PLACEMENT Bilateral 07/10/2020   Procedure: CYSTOSCOPY/URETEROSCOPY/HOLMIUM LASER/STENT PLACEMENT;  Surgeon: Francisca Redell BROCKS, MD;  Location: [facility];  Service: Urology;   Laterality: Bilateral;   CYSTOSCOPY/URETEROSCOPY/HOLMIUM LASER/STENT PLACEMENT Right 08/25/2023   Procedure: CYSTOSCOPY/URETEROSCOPY/HOLMIUM LASER;  Surgeon: Francisca Redell BROCKS, MD;  Location: [facility];  Service: Urology;  Laterality: Right;   CYSTOSCOPY/URETEROSCOPY/HOLMIUM LASER/STENT PLACEMENT Right 12/15/2023   Procedure: CYSTOSCOPY/URETEROSCOPY/HOLMIUM LASER;  Surgeon: Francisca Redell BROCKS, MD;  Location: [facility];  Service: Urology;  Laterality: Right;   EXTRACORPOREAL SHOCK WAVE LITHOTRIPSY     x 10 plus   Eye Lift     FACIAL COSMETIC SURGERY     GANGLION CYST EXCISION Right 12/21/2021   Procedure: REMOVAL GANGLION OF WRIST;  Surgeon: Kathlynn Sharper, MD;  Location: [facility];  Service: Orthopedics;  Laterality: Right;   LIPOSUCTION     TONSILLECTOMY     TRIGGER FINGER RELEASE Right 12/21/2021   Procedure: RELEASE TRIGGER FINGER/A-1 PULLEY;  Surgeon: Kathlynn Sharper, MD;  Location: [facility];  Service: Orthopedics;  Laterality: Right;   URETEROSCOPY WITH HOLMIUM LASER LITHOTRIPSY     Patient Active Problem List   Diagnosis Date Noted   Acute CVA (cerebrovascular accident) (HCC) 12/19/2023   Hypokalemia 12/19/2023   AKI (acute kidney injury) 12/19/2023   Leukocytosis 12/19/2023   Hyperlipidemia, unspecified 12/19/2023   Essential hypertension 12/19/2023   Vision changes 12/19/2023   Lymphedema 05/22/2023   Chronic venous insufficiency 05/22/2023   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 replacement therapy 04/10/2023   Insomnia due  to medical condition 04/25/2022   GAD (generalized anxiety disorder) 02/02/2022   Other specified depressive episodes 02/02/2022   Long-term current use of benzodiazepine 02/02/2022   Migraine with aura and without status migrainosus, not intractable 11/03/2020   Arrhythmia 08/06/2020   Status migrainosus 02/25/2019   Osteoporosis 04/09/2013   Vitamin D deficiency 04/09/2013   DDD (degenerative disc disease), lumbosacral 02/08/2005   ONSET DATE:  12/18/2023  REFERRING DIAG:   THERAPY DIAG:  Muscle weakness (generalized)  Rationale for Evaluation and Treatment: Rehabilitation  SUBJECTIVE:  SUBJECTIVE STATEMENT: Pt. reports that she will have to manipulate small objects int the [facility] [employer] department at work.  Pt accompanied by: significant other  PERTINENT HISTORY: Pt. has Hx of stroke with onset 12/18/2023. Pt. PMHx includes: HTN, Hyperlipidemia, Kidney Stones, near syncopal event, loss of vision.  PRECAUTIONS: None  WEIGHT BEARING RESTRICTIONS: None  PAIN:  Are you having pain? No  FALLS: Has patient fallen in last 6 months? Yes. Number of falls    LIVING ENVIRONMENT: Lives with: lives with their family Lives in: [place], [place] Stairs: No, inside the house, yes but does not use Has following equipment at home:   PLOF: Independent  PATIENT GOALS: To be able to see  OBJECTIVE:  Note: Objective measures were completed at Evaluation unless otherwise noted.  HAND DOMINANCE: Right  ADLs:  Eating: Drinking from straw is different, able to use utensils.  Grooming: Fatigues completing hair care.  UB Dressing: independent, if clothes are in front of her she can find clothing LB Dressing: Independent Toileting: Independent Bathing: Independent Tub Shower transfers: Independent Equipment: none Has difficulty with using cell phone  IADLs: Shopping: Does not typically go out shopping, and has not tried to. Light housekeeping: Pt. reports that she does not typically do house cleaning. Pt. has been able to unpack belongings from boxes due to her recent move in. Meal Prep: Pt. reports that is does not currently cook, however reports that she probably could. Has difficulty opening bottle caps/lids. Community mobility: Requires assistance from husband to navigate  through buildings, negotiate stairs-Pt. With increased fear of falling  Medication management: Able to push down medication bottles to open  Financial management: No  change in the process-uses automatic bill pay system.  Has difficulty with using cell phone Handwriting: N/T Work: Pt. was actively working managing a Health visitor, works a lot of hours  MOBILITY STATUS: Independent and Needs Assist: Requires hand on hand assistance with nvigating through environments.   POSTURE COMMENTS:  No Significant postural limitations Sitting balance: Good  ACTIVITY TOLERANCE: Activity tolerance: Good  FUNCTIONAL OUTCOME MEASURES:   UPPER EXTREMITY ROM:    Active ROM Right Eval WFL Left Eval [facility]  Shoulder flexion    Shoulder abduction    Shoulder adduction    Shoulder extension    Shoulder internal rotation    Shoulder external rotation    Elbow flexion    Elbow extension    Wrist flexion    Wrist extension    Wrist ulnar deviation    Wrist radial deviation    Wrist pronation    Wrist supination    (Blank rows = not tested)  UPPER EXTREMITY MMT:     MMT Right eval Right 01/29/24 Right  04/04/24 Left eval Left 01/29/24 Left 04/04/24  Shoulder flexion 4-/5 5/5 5/5 4-/5 5/5 5/5  Shoulder abduction 4-/5 5/5 5/5 4-/5 5/5 5/5  Shoulder adduction        Shoulder extension  Shoulder internal rotation        Shoulder external rotation        Middle trapezius        Lower trapezius        Elbow flexion 5/5 5/5 5/5 5/5 5/5 5/5  Elbow extension 5/5 5/5 5/5 5/5 5/5 5/5  Wrist flexion 4/5 5/5 5/5 4-/5 5/5 5/5  Wrist extension 4/5 5/5 5/5 4-/5 5/5 5/5  Wrist ulnar deviation        Wrist radial deviation        Wrist pronation        Wrist supination        (Blank rows = not tested)  HAND FUNCTION: Grip strength: Right: 39 lbs; Left: 28 lbs, Lateral pinch: Right: 11 lbs, Left: 9 lbs, and 3 point pinch: Right: 9 lbs, Left: 9 lbs  01/29/24 Grip strength: Right: 39 lbs; Left: 36 lbs, Lateral pinch: Right: 14 lbs, Left: 12 lbs, and 3 point pinch: Right: 12 lbs, Left: 11 lbs  04/04/24:  Grip strength: Right: 37 lbs; Left: 42  lbs    COORDINATION: 9 Hole Peg test: Right: 30 sec; Left: 36 sec  01/29/24 9 Hole Peg test: Right: 24 sec; Left: 23 sec.  04/04/24:  9 Hole Peg test: Right: 28 sec; Left: 33 sec.      SENSATION: WFL  EDEMA:   MUSCLE TONE:   COGNITION: Overall cognitive status: Within functional limits for tasks assessed  VISION: Subjective report: Pt. reports changes in her vision at onset of stroke, starting with blurry vision, resulting in vision loss in the R eye.  Baseline vision: Pt. Is able to visually track  Visual history: Hx of eye surgery  VISION ASSESSMENT: TBD  PERCEPTION: WFL  PRAXIS: WFL  OBSERVATIONS:                                                                                                                    TREATMENT DATE: 04/08/24   Therapeutic activities:  -Facilitated progressive right gross grip strengthening with 17.9# of grip strength resistive force  x's 2 trials. -Incorporated a reaching component through multiple planes in combination with the task with sustained gross grip to further challenge distal motor control while moving the proximal UE.  -Facilitated Lateral, and 3pt. pinch strengthening using yellow, red, green, and blue, and black level resistive clips.  -Facilitated translatory movements moving clips from the lateral pinch position to the 3pt. pinch position in preparation for securely placing them on the dowel to promote hand function skills. -Completed EZ Board exercises performed to target right forearm supination/pronation, wrist flexion/extension using gross grasp, and lateral pinch (key) grasp in multiple  planes to promote shoulder flexion, abduction, and wrist flexion, and extension while performing resistive wrist flexion and extension with a gross grip. -Facilitated FMC grasp for 1 resistive cubes alternating thumb opposition to the tip of the 2nd/3rd digits while the board is placed at a vertical angle to encourage wrist  extension. Emphasis was placed on islolating  2nd digit extension to press them into place -Facilitated visual scanning skills, and visual memory skills using moderately complex schedules with the scanning plane placed on a vertical angle.         HOME EXERCISE PROGRAM:  -Yellow Theraputty exercises for  hand strengthening at bridge -Visual perceptual: Flip flop target design patterns. -Visual memory tasks -Writing tasks -visual motor tasks.  GOALS: Goals reviewed with patient? Yes  SHORT TERM GOALS: Target date: 04/17/2024    Pt. Will independently utilize HEP for hand strength, coordination, and visual compensatory strategies ADL/ADLs Baseline: 03/06/24: Independent Eval: No  current HEP  Goal status: Ongoing   LONG TERM GOALS: Target date: 05/29/2024   Pt. Will be able to independently implement visual scanning/visual search compensatory strategies for tasks within her extra personal space navigating through community environments 100% of the time.  Baseline: 01/29/24:  Independent  100% of the time within the therapy gym, and hallway. Eval: Pt. Requires increased assist to navigate through community environments. Goal status: Achieved  2.  Pt. Will be able to independently utilize visual scanning/visual search strategies  during ADLS/IADL within her near space, and during tabletop top tasks. 100% of the time.  Baseline: 03/06/24: Initiates visual scanning 85% of the time  01/29/24: 75% Eval: Pt. Education to be provided.  Goal status: INITIAL  3.  Pt. Will be able to independently initiate visual scanning techniques in her own environment to reduce risk of falls.  Baseline: 01/29/24: Education was provided, Pt. Is utlizing visual compensatory strategies/visual scanning with in her home. Eval: Pt. Education to be provided.  Goal status: Achieved  4.  Pt. Will increase BUE Grip Strength by 5# to be able to independently and securely hold objects for ADL/IADL use. Baseline:   04/04/2024: Grip strength: Right: 37 lbs; Left: 42 lbs patient is dropping items from her hands 01/29/24: Grip strength: Right: 39 lbs; Left: 28 lbs, Eval: Right Grip: 39#, Left Grip: 28# Goal status: 04/04/2024 revised  5.  Pt. Will increase L Lateral Pinch strength by 2# to be able to open bottles. Baseline: 04/04/2024: Grip strength: Right: 37 lbs; Left: 42 lbs 01/29/24: Lateral pinch: Right: 14 lbs, Left: 12 lbs Eval: Lateral pinch: Right: 11 lbs, Left: 9 lbs  Goal status: Achieved  6.  Pt. Will increase BUE strength for shoulder flexion/abduction by 2 mm grades to independently complete hair care tasks. Baseline: 03/16/2024: 5/5 01/29/24: 5/5 overall Eval: Right Shoulder Flexion: 4-/5, Left Shoulder Flexion:4-/5, Right Shoulder Abduction: 4-/5, Left Shoulder Abduction: 4-/5 Goal status: Achieved  7.  Pt will be able to independently and efficiently manipulate medication without dropping them.  Baseline: 04/04/2024: 9 Hole Peg test: Right: 28 sec; Left: 33 sec. 01/29/24: Independent 01/15/24: Daughter currently manages medication set up.  Goal status: 04/04/2024 revised  8. Pt will increase typing speed to 20-30 words per minute with at least 90% accuracy to work towards more efficient typing for job related  responsibilities.  Baseline: 03/06/24: 10 wpm with 92% accuracy. 01/29/24: Continue 01/15/24: 8wpm x88% accuracy=7 wpm  Goal status: Progressing/Ongoing  9. Pt will be able to scan small print on store receipts to check for errors with 100% accuracy using visual compensation strategies as needed.  Baseline: 03/06/24: Pt. Continues to present with difficulty with visual scanning small  small items efficiently. 01/29/24: Pt. Continues to have difficulty scanning small print items. 01/15/24: Not yet attempted; required for return to work/job responsibility  Goal status:  Ongoing  10. Pt. will independently identify 8 items  consistently from visual visual memory in preparation or ADLs.    Baseline:  03/06/24: Pt. is able to identify up to 6 picture items while seated at the tabletop. Pt. is able to consistently Identify 4 letters from visual memory when attention is divided.  01/29/24: Pt. is able to identify 6 items consistently from visual memory.    Gaol status: Ongoing      11. Pt. will be independently write 4 sentences  efficiently with 100% legibility, no deviation from writing on a blank line, and appropriate spacing between letters.              Baseline: 03/06/24: 4 lines with 75% legibility, positive deviation below the line, and excessive spacing between the words.              Goal status: Ongoing  ASSESSMENT:  CLINICAL IMPRESSION:  Pt. reports that she had a restful weekend. Pt. reports that she found out that she can receive Botox injections to help with ere eye twitching. Pt. requires consistent cues for right hand position, and technique for each of the therapeutic activities to improve hand function, and strength to be able to hold items more securely in her hand. Pt. Requires increased time to complete the visual scanning task with the scan placed vertically. Pt. continues to benefit from Occupational Therapy services to improve her ability to use visual compensatory strategies, and improve overall BUE functioning in order to improve engagement in, and maximize overall independence with ADL, and IADL tasks.    PERFORMANCE DEFICITS: in functional skills including ADLs, IADLs, coordination, dexterity, Fine motor control, and vision, and psychosocial skills including coping strategies, environmental adaptation, habits, interpersonal interactions, and routines and behaviors.   IMPAIRMENTS: are limiting patient from ADLs, IADLs, rest and sleep, work, leisure, and social participation.   CO-MORBIDITIES: may have co-morbidities  that affects occupational performance. Patient will benefit from skilled OT to address above impairments and improve overall function.  MODIFICATION OR  ASSISTANCE TO COMPLETE EVALUATION: Min-Moderate modification of tasks or assist with assess necessary to complete an evaluation.  OT OCCUPATIONAL PROFILE AND HISTORY: Detailed assessment: Review of records and additional review of physical, cognitive, psychosocial history related to current functional performance.  CLINICAL DECISION MAKING: Moderate - several treatment options, min-mod task modification necessary  REHAB POTENTIAL: Good  EVALUATION COMPLEXITY: Moderate    PLAN:  OT FREQUENCY: 2x/week  OT DURATION: 12 weeks  PLANNED INTERVENTIONS: 97168 OT Re-evaluation, 97535 self care/ADL training, 02889 therapeutic exercise, 97530 therapeutic activity, 97112 neuromuscular re-education, visual/perceptual remediation/compensation, patient/family education, and DME and/or AE instructions  RECOMMENDED OTHER SERVICES: ST  CONSULTED AND AGREED WITH PLAN OF CARE: Patient and family member/caregiver  PLAN FOR NEXT SESSION: Treatment  Richardson Otter, MS, OTR/L 03/28/24

## 2024-04-09 ENCOUNTER — Encounter

## 2024-04-09 ENCOUNTER — Encounter: Admitting: Occupational Therapy

## 2024-04-09 ENCOUNTER — Ambulatory Visit

## 2024-04-09 LAB — CULTURE, URINE COMPREHENSIVE

## 2024-04-11 ENCOUNTER — Ambulatory Visit: Admitting: Occupational Therapy

## 2024-04-11 ENCOUNTER — Ambulatory Visit: Admitting: Physical Therapy

## 2024-04-11 ENCOUNTER — Ambulatory Visit

## 2024-04-11 DIAGNOSIS — M6281 Muscle weakness (generalized): Secondary | ICD-10-CM

## 2024-04-11 DIAGNOSIS — R41841 Cognitive communication deficit: Secondary | ICD-10-CM | POA: Diagnosis not present

## 2024-04-11 DIAGNOSIS — H543 Unqualified visual loss, both eyes: Secondary | ICD-10-CM

## 2024-04-11 DIAGNOSIS — R278 Other lack of coordination: Secondary | ICD-10-CM

## 2024-04-11 NOTE — Therapy (Signed)
 OUTPATIENT SPEECH LANGUAGE PATHOLOGY  TREATMENT   Patient Name: Ashley Collins MRN: [id number] DOB:[birth year], 63 y.o., female Today's Date: 04/11/2024  PCP: Gaetana Haddock, NP  REFERRING PROVIDER: same   End of Session - 04/11/24 1441     Visit Number 14    Number of Visits 24    Date for Recertification  05/14/24    SLP Start Time 1445    SLP Stop Time  1530    SLP Time Calculation (min) 45 min    Activity Tolerance Patient tolerated treatment well           Patient Active Problem List   Diagnosis Date Noted   Acute CVA (cerebrovascular accident) (HCC) 12/19/2023   Hypokalemia 12/19/2023   AKI (acute kidney injury) 12/19/2023   Leukocytosis 12/19/2023   Hyperlipidemia, unspecified 12/19/2023   Essential hypertension 12/19/2023   Vision changes 12/19/2023   Lymphedema 05/22/2023   Chronic venous insufficiency 05/22/2023   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 replacement therapy 04/10/2023   Insomnia due to medical condition 04/25/2022   GAD (generalized anxiety disorder) 02/02/2022   Other specified depressive episodes 02/02/2022   Long-term current use of benzodiazepine 02/02/2022   Migraine with aura and without status migrainosus, not intractable 11/03/2020   Arrhythmia 08/06/2020   Status migrainosus 02/25/2019   Osteoporosis 04/09/2013   Vitamin D deficiency 04/09/2013   DDD (degenerative disc disease), lumbosacral 02/08/2005    ONSET DATE: 12/19/23   REFERRING DIAG: CVA- memory deficits  THERAPY DIAG:  Cognitive communication deficit  Rationale for Evaluation and Treatment Rehabilitation  SUBJECTIVE:   SUBJECTIVE STATEMENT: Pt alert, pleasant, and cooperative. Pt accompanied by: self and significant other  PERTINENT HISTORY & DIAGNOSTIC FINDINGS: Pt is 63 y.o. female who presents today for a cognitive-communication evaluation in setting of stroke. MRI 12/18/23 1. 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 No associated  hemorrhage or mass effect. PMHx as outlined above.  PAIN:  Are you having pain? No   FALLS: Has patient fallen in last 6 months?  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   PATIENT GOALS  to return to PLOF   OBJECTIVE:  TODAY'S TREATMENT: Pt seen for skilled ST targeting memory. Pt recalled 5/7 pictured items from last session with initial min/mod cues. Pt completed immediate recall with distraction task with 100% accuracy for 2 items, 60% accuracy with 3 items and longer delay. Pt benefited from cues to repeat items and associate.  Discussed potential compensations for returning to work (e.g. keeping to do list on phone, using Siri for efficiency). Pt continues to express frustration with CLOF. Supportive counseling provided as appropriate.   PATIENT EDUCATION: Education details: as above Person educated: Patient and Spouse Education method: Explanation; Handout Education comprehension: verbalized understanding  HOME EXERCISE PROGRAM:        Practice narrating actions  Telling self to mentally focus  Continue calendar use, blocking schedule, and journaling  Scheduling activity     GOALS:  Goals reviewed with patient? Yes  SHORT TERM GOALS: Target date: 10 sessions  Pt will complete PROM re: memory.  Baseline: Goal status: MET   2.  Pt will endorse successful implementation of at least x2 compensations for attention and memory.  Baseline:  Goal status: MET  3.  With Moderate A, patient will establish  external aid for memory/executive function and bring to more than 50% of therapy sessions.    Baseline:  Goal status: MET    LONG TERM GOALS: Target date: 12 weeks  Pt will endorse improvement in cognitive-communication per PROM.   Baseline:  Goal status: PROGRESSING  2.  Pt and/or husband will demonstrate understanding of ways to promote and support cognitive-communication outside of SLP sessions.  Baseline:  Goal status: PROGRESSING   ASSESSMENT:  CLINICAL IMPRESSION:  Pt is 63 y.o. female who presents today for a cognitive-communication treatment in setting of stroke. Initial assessment completed via formal means (Cognitive-Linguistic Quick Test) and PROM (Neuro-QoL Adult Cognitive Function v2.0). Pt presents with at least mild cognitive-communication deficits affecting attention and visuospatial skills per CLQT as well as memory and executive functioning per PROM. Suspect CLQT is not as sensitive to higher level cognitive-communication deficits appreciated by pt during iADLs. See details of today's tx as outlined above. Recommend skilled ST services targeting above mentioned deficits to improve QoL and performance on ADLs/IADLs.  OBJECTIVE IMPAIRMENTS include attention, memory, executive functioning, and visuospatial deficits. These impairments are limiting patient from return to work, managing appointments, household responsibilities, and ADLs/IADLs. Factors affecting potential to achieve goals and functional outcome are medical prognosis.. Patient will benefit from skilled SLP services to address above impairments and improve overall function.  REHAB POTENTIAL: Good  PLAN: SLP FREQUENCY: 2x/week  SLP DURATION: 12 weeks  PLANNED INTERVENTIONS: Cueing hierachy, Cognitive reorganization, Internal/external aids, Functional tasks, SLP instruction and feedback, Compensatory strategies, and Patient/family education    Delon Bangs, M.S., CCC-SLP Speech-Language Pathologist [facility] [phone] [place])   [facility] [address] [place], [place], [address] Phone: [phone]   Fax:  [phone]

## 2024-04-11 NOTE — Therapy (Signed)
 Occupational Therapy Progress Note  Dates of reporting period  03/06/24   to   04/11/24    Patient Name: Ashley Collins MRN: [id number] DOB:[birth year], 63 y.o., female Today's Date: 04/11/2024  PCP: Harvey Gaetana CROME, NP REFERRING PROVIDER: Harvey Gaetana CROME, NP   OT End of Session - 04/11/24 2234     Visit Number 30    Number of Visits 48    Date for Recertification  05/29/24    OT Start Time 1400    OT Stop Time 1445    OT Time Calculation (min) 45 min    Activity Tolerance Patient tolerated treatment well    Behavior During Therapy WFL for tasks assessed/performed              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   Cardiac arrhythmia    Nonspecific ST T wave changes on EKG   Chronic venous insufficiency of lower extremity    Complication of anesthesia    nausea and vomiting   DDD (degenerative disc disease), lumbosacral    Essential hypertension    Headache    History of kidney stones    Hyperlipidemia    Kidney stones    Lymphedema    Migraines    Osteoporosis    PONV (postoperative nausea and vomiting)    Right ureteral stone    Vitamin B12 deficiency    Vitamin D deficiency    Past Surgical History:  Procedure Laterality Date   AUGMENTATION MAMMAPLASTY     CESAREAN SECTION     x 4   COLONOSCOPY WITH PROPOFOL  N/A 03/31/2022   Procedure: COLONOSCOPY WITH PROPOFOL ;  Surgeon: Onita Elspeth Sharper, DO;  Location: [facility] ENDOSCOPY;  Service: Gastroenterology;  Laterality: N/A;   CYSTOSCOPY W/ RETROGRADES Bilateral 07/10/2020   Procedure: CYSTOSCOPY WITH RETROGRADE PYELOGRAM;  Surgeon: Francisca Redell BROCKS, MD;  Location: [facility] ORS;  Service: Urology;  Laterality: Bilateral;   CYSTOSCOPY/URETEROSCOPY/HOLMIUM LASER/STENT PLACEMENT     CYSTOSCOPY/URETEROSCOPY/HOLMIUM LASER/STENT PLACEMENT Bilateral 07/10/2020   Procedure: CYSTOSCOPY/URETEROSCOPY/HOLMIUM LASER/STENT PLACEMENT;  Surgeon: Francisca Redell BROCKS, MD;  Location: [facility] ORS;  Service: Urology;  Laterality: Bilateral;   CYSTOSCOPY/URETEROSCOPY/HOLMIUM LASER/STENT PLACEMENT Right 08/25/2023   Procedure: CYSTOSCOPY/URETEROSCOPY/HOLMIUM LASER;  Surgeon: Francisca Redell BROCKS, MD;  Location: [facility] ORS;  Service: Urology;  Laterality: Right;   CYSTOSCOPY/URETEROSCOPY/HOLMIUM LASER/STENT PLACEMENT Right 12/15/2023   Procedure: CYSTOSCOPY/URETEROSCOPY/HOLMIUM LASER;  Surgeon: Francisca Redell BROCKS, MD;  Location: [facility] ORS;  Service: Urology;  Laterality: Right;   EXTRACORPOREAL SHOCK WAVE LITHOTRIPSY     x 10 plus   Eye Lift     FACIAL COSMETIC SURGERY     GANGLION CYST EXCISION Right 12/21/2021   Procedure: REMOVAL GANGLION OF WRIST;  Surgeon: Kathlynn Sharper, MD;  Location: [facility] ORS;  Service: Orthopedics;  Laterality: Right;   LIPOSUCTION     TONSILLECTOMY     TRIGGER FINGER RELEASE Right 12/21/2021   Procedure: RELEASE TRIGGER FINGER/A-1 PULLEY;  Surgeon: Kathlynn Sharper, MD;  Location: [facility] ORS;  Service: Orthopedics;  Laterality: Right;   URETEROSCOPY WITH HOLMIUM LASER LITHOTRIPSY     Patient Active Problem List   Diagnosis Date Noted   Acute CVA (cerebrovascular accident) (HCC) 12/19/2023   Hypokalemia 12/19/2023   AKI (acute kidney injury) 12/19/2023   Leukocytosis 12/19/2023   Hyperlipidemia, unspecified 12/19/2023   Essential hypertension 12/19/2023   Vision changes 12/19/2023   Lymphedema 05/22/2023   Chronic venous insufficiency  05/22/2023   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 replacement therapy 04/10/2023   Insomnia due to medical condition 04/25/2022   GAD (generalized anxiety disorder) 02/02/2022   Other specified depressive episodes 02/02/2022   Long-term current use of benzodiazepine 02/02/2022   Migraine with aura and without status migrainosus, not intractable 11/03/2020   Arrhythmia 08/06/2020   Status migrainosus 02/25/2019   Osteoporosis 04/09/2013   Vitamin D deficiency 04/09/2013   DDD (degenerative disc disease),  lumbosacral 02/08/2005   ONSET DATE: 12/18/2023  REFERRING DIAG:   THERAPY DIAG:  Muscle weakness (generalized)  Other lack of coordination  Low vision, both eyes  Rationale for Evaluation and Treatment: Rehabilitation  SUBJECTIVE:  SUBJECTIVE STATEMENT: Patient reports being very concerned that she was unable to complete the memory homework task that she was provided in speech therapy.   Pt accompanied by: significant other  PERTINENT HISTORY: Pt. has Hx of stroke with onset 12/18/2023. Pt. PMHx includes: HTN, Hyperlipidemia, Kidney Stones, near syncopal event, loss of vision.  PRECAUTIONS: None  WEIGHT BEARING RESTRICTIONS: None  PAIN:  Are you having pain? No  FALLS: Has patient fallen in last 6 months? Yes. Number of falls    LIVING ENVIRONMENT: Lives with: lives with their family Lives in: [place], [place] Stairs: No, inside the house, yes but does not use Has following equipment at home:   PLOF: Independent  PATIENT GOALS: To be able to see  OBJECTIVE:   HAND DOMINANCE: Right  ADLs:  Eating: Drinking from straw is different, able to use utensils.  Grooming: Fatigues completing hair care.  UB Dressing: independent, if clothes are in front of her she can find clothing LB Dressing: Independent Toileting: Independent Bathing: Independent Tub Shower transfers: Independent Equipment: none Has difficulty with using cell phone  IADLs: Shopping: Does not typically go out shopping, and has not tried to. Light housekeeping: Pt. reports that she does not typically do house cleaning. Pt. has been able to unpack belongings from boxes due to her recent move in. Meal Prep: Pt. reports that is does not currently cook, however reports that she probably could. Has difficulty opening bottle caps/lids. Community mobility: Requires assistance from husband to navigate  through buildings, negotiate stairs-Pt. With increased fear of falling  Medication management: Able to push  down medication bottles to open  Financial management: No change in the process-uses automatic bill pay system.  Has difficulty with using cell phone Handwriting: N/T Work: Pt. was actively working managing a Health visitor, works a lot of hours  MOBILITY STATUS: Independent and Needs Assist: Requires hand on hand assistance with nvigating through environments.   POSTURE COMMENTS:  No Significant postural limitations Sitting balance: Good  ACTIVITY TOLERANCE: Activity tolerance: Good  FUNCTIONAL OUTCOME MEASURES:   UPPER EXTREMITY ROM:    Active ROM Right Eval WFL Left Eval [facility]  Shoulder flexion    Shoulder abduction    Shoulder adduction    Shoulder extension    Shoulder internal rotation    Shoulder external rotation    Elbow flexion    Elbow extension    Wrist flexion    Wrist extension    Wrist ulnar deviation    Wrist radial deviation    Wrist pronation    Wrist supination    (Blank rows = not tested)  UPPER EXTREMITY MMT:     MMT Right eval Right 01/29/24 Right  04/04/24 Left eval Left 01/29/24 Left 04/04/24  Shoulder flexion 4-/5 5/5 5/5 4-/5 5/5 5/5  Shoulder abduction 4-/5 5/5  5/5 4-/5 5/5 5/5  Shoulder adduction        Shoulder extension        Shoulder internal rotation        Shoulder external rotation        Middle trapezius        Lower trapezius        Elbow flexion 5/5 5/5 5/5 5/5 5/5 5/5  Elbow extension 5/5 5/5 5/5 5/5 5/5 5/5  Wrist flexion 4/5 5/5 5/5 4-/5 5/5 5/5  Wrist extension 4/5 5/5 5/5 4-/5 5/5 5/5  Wrist ulnar deviation        Wrist radial deviation        Wrist pronation        Wrist supination        (Blank rows = not tested)  HAND FUNCTION: Grip strength: Right: 39 lbs; Left: 28 lbs, Lateral pinch: Right: 11 lbs, Left: 9 lbs, and 3 point pinch: Right: 9 lbs, Left: 9 lbs  01/29/24 Grip strength: Right: 39 lbs; Left: 36 lbs, Lateral pinch: Right: 14 lbs, Left: 12 lbs, and 3 point pinch: Right: 12 lbs, Left: 11  lbs  04/04/24:  Grip strength: Right: 37 lbs; Left: 42 lbs    COORDINATION: 9 Hole Peg test: Right: 30 sec; Left: 36 sec  01/29/24 9 Hole Peg test: Right: 24 sec; Left: 23 sec.  04/04/24:  9 Hole Peg test: Right: 28 sec; Left: 33 sec.      SENSATION: WFL  EDEMA:   MUSCLE TONE:   COGNITION: Overall cognitive status: Within functional limits for tasks assessed  VISION: Subjective report: Pt. reports changes in her vision at onset of stroke, starting with blurry vision, resulting in vision loss in the R eye.  Baseline vision: Pt. Is able to visually track  Visual history: Hx of eye surgery  VISION ASSESSMENT: TBD  PERCEPTION: WFL  PRAXIS: WFL  OBSERVATIONS:                                                                                                                    TREATMENT DATE: 04/11/24   Therapeutic activities:  -Facilitated right hand progressive right gross grip strengthening with 2 red level resistive bands.  Patient worked on Systems analyst for 10-20 reps with a gripper in both vertical and horizontal planes. -Facilitated Lateral, and 3pt. pinch strengthening using yellow, red, green, and blue, and black level resistive clips.  -Facilitated translatory movements moving clips from the lateral pinch position to the 3pt. pinch position in preparation for securely placing them on the dowel to promote hand function skills. -Completed EZ Board exercises performed to target right forearm supination/pronation, wrist flexion/extension using gross grasp, and lateral pinch (key) grasp in multiple  planes to promote shoulder flexion, abduction, and wrist flexion, and extension while performing resistive wrist flexion and extension with a gross grip. -Facilitated FMC grasp for 1 resistive cubes alternating thumb opposition to the tip of the 2nd/3rd digits while the board is placed at a vertical angle  to encourage wrist extension. Emphasis was placed on islolating  2nd digit extension to press them into place for the first trial, and alternating isolated 2nd through 5th digits extension for the second trial. -Facilitated visual scanning skills combination with visual memory skills using moderately complex schedules with the scanning plane at the table top surface.  HOME EXERCISE PROGRAM:  -Yellow Theraputty exercises for  hand strengthening at bridge -Visual perceptual: Flip flop target design patterns. -Visual memory tasks -Writing tasks -visual motor tasks.  GOALS: Goals reviewed with patient? Yes  SHORT TERM GOALS: Target date: 04/17/2024    Pt. Will independently utilize HEP for hand strength, coordination, and visual compensatory strategies ADL/ADLs Baseline: 04/12/23: Independent, continue 03/06/24: Independent Eval: No  current HEP  Goal status: Ongoing   LONG TERM GOALS: Target date: 05/29/2024   Pt. Will be able to independently implement visual scanning/visual search compensatory strategies for tasks within her extra personal space navigating through community environments 100% of the time.  Baseline: 01/29/24:  Independent  100% of the time within the therapy gym, and hallway. Eval: Pt. Requires increased assist to navigate through community environments. Goal status: Achieved  2.  Pt. Will be able to independently utilize visual scanning/visual search strategies  during ADLs/IADLs within her near space, and during tabletop tasks, and tasks at a vertical plane with 100% accuracy. Baseline: 04/11/24: 80% simple to moderately complex visual scanning tasks however 70% accuracy for complex visual scanning tasks, and scanning at a vertical plane. 03/06/24: Initiates visual scanning 85% of the time  01/29/24: 75% Eval: Pt. Education to be provided.  Goal status: revised-04/11/24  3.  Pt. Will be able to independently initiate visual scanning techniques in her own environment to reduce risk of falls.  Baseline: 01/29/24: Education was provided,  Pt. is utlizing visual compensatory strategies/visual scanning within her home. Eval: Pt. Education to be provided.  Goal status: Achieved  4.  Pt. Will increase BUE Grip Strength by 5# to be able to independently and securely hold objects for ADL/IADL use. Baseline:  04/11/24: Continue- recent revision 04/04/2024: Grip strength: Right: 37 lbs; Left: 42 lbs patient is dropping items from her hands 01/29/24: Grip strength: Right: 39 lbs; Left: 28 lbs, Eval: Right Grip: 39#, Left Grip: 28# Goal status: Ongoing-recent revision  5.  Pt. Will increase L Lateral Pinch strength by 2# to be able to open bottles. Baseline: 04/04/2024: Grip strength: Right: 37 lbs; Left: 42 lbs 01/29/24: Lateral pinch: Right: 14 lbs, Left: 12 lbs Eval: Lateral pinch: Right: 11 lbs, Left: 9 lbs  Goal status: Achieved  6.  Pt. Will increase BUE strength for shoulder flexion/abduction by 2 mm grades to independently complete hair care tasks. Baseline: 03/16/2024: 5/5 01/29/24: 5/5 overall Eval: Right Shoulder Flexion: 4-/5, Left Shoulder Flexion:4-/5, Right Shoulder Abduction: 4-/5, Left Shoulder Abduction: 4-/5 Goal status: Achieved  7.  Pt will be able to independently and efficiently manipulate medication without dropping them.  Baseline: 04/11/24: Continue, recent revision 04/04/2024: 9 Hole Peg test: Right: 28 sec; Left: 33 sec. 01/29/24: Independent 01/15/24: Daughter currently manages medication set up.  Goal status: Ongoing, recent revision  8. Pt will increase typing speed to 20-30 words per minute with at least 90% accuracy to work towards more efficient typing for job related  responsibilities.  Baseline: 04/11/24: TBD 03/06/24: 10 wpm with 92% accuracy. 01/29/24: Continue 01/15/24: 8wpm x88% accuracy=7 wpm  Goal status: Ongoing  9. Pt will be able to scan small print on store receipts to check for errors  with 100% accuracy using visual compensation strategies as needed.  Baseline:04/11/24: Pt. Continues to present with  difficulty with visual scanning small  small items efficiently. 03/06/24: Pt. Continues to present with difficulty with visual scanning small  small items efficiently. 01/29/24: Pt. Continues to have difficulty scanning small print items. 01/15/24: Not yet attempted; required for return to work/job responsibility  Goal status:  Ongoing  10. Pt. will independently identify 8 items consistently from visual visual memory in preparation or ADLs.    Baseline: 04/11/24: 5-7 items  while seated at the tabletop. 03/06/24: Pt. is able to identify up to 6 picture items while seated at the tabletop. Pt. is able to consistently Identify 4 letters from visual memory when attention is divided.  01/29/24: Pt. is able to identify 6 items consistently from visual memory.    Gaol status: Ongoing      11. Pt. will be independently write 4 sentences  efficiently with 100% legibility, no deviation from writing on a blank line, and appropriate spacing between letters.              Baseline: 04/11/24: Continue 03/06/24: 4 lines with 75% legibility, positive deviation below the line, and excessive spacing between the words.              Goal status: Ongoing  ASSESSMENT:  CLINICAL IMPRESSION:  Patient reports being very concerned that she was unable to complete the memory homework task she was provided in speech therapy. Measurements were recently obtained, and goals were reinstated for right grip strength, pinch strength, and fine motor coordination skills.  Patient continues to present with weakness in the right upper extremity and continues to drop things at home. Patient is progressing with using visual compensatory strategies for visual scanning. The goal for visual scanning was revised to work on accuracy, as well as to challenge visual scanning skills in a vertical plane. Patient continues to present with difficulty with visual memory impacting daily ADL and IADL care. Patient to continue with plan of care and goals into the  next progress reporting period. Pt. continues to benefit from Occupational Therapy services to improve her ability to use visual compensatory strategies, and improve overall BUE functioning in order to improve engagement in, and maximize overall independence with ADL, and IADL tasks.    PERFORMANCE DEFICITS: in functional skills including ADLs, IADLs, coordination, dexterity, Fine motor control, and vision, and psychosocial skills including coping strategies, environmental adaptation, habits, interpersonal interactions, and routines and behaviors.   IMPAIRMENTS: are limiting patient from ADLs, IADLs, rest and sleep, work, leisure, and social participation.   CO-MORBIDITIES: may have co-morbidities  that affects occupational performance. Patient will benefit from skilled OT to address above impairments and improve overall function.  MODIFICATION OR ASSISTANCE TO COMPLETE EVALUATION: Min-Moderate modification of tasks or assist with assess necessary to complete an evaluation.  OT OCCUPATIONAL PROFILE AND HISTORY: Detailed assessment: Review of records and additional review of physical, cognitive, psychosocial history related to current functional performance.  CLINICAL DECISION MAKING: Moderate - several treatment options, min-mod task modification necessary  REHAB POTENTIAL: Good  EVALUATION COMPLEXITY: Moderate    PLAN:  OT FREQUENCY: 2x/week  OT DURATION: 12 weeks  PLANNED INTERVENTIONS: 97168 OT Re-evaluation, 97535 self care/ADL training, 02889 therapeutic exercise, 97530 therapeutic activity, 97112 neuromuscular re-education, visual/perceptual remediation/compensation, patient/family education, and DME and/or AE instructions  RECOMMENDED OTHER SERVICES: ST  CONSULTED AND AGREED WITH PLAN OF CARE: Patient and family member/caregiver  PLAN FOR NEXT SESSION: Treatment  Hazley Dezeeuw,  MS, OTR/L 04/11/24

## 2024-04-16 ENCOUNTER — Ambulatory Visit

## 2024-04-16 ENCOUNTER — Ambulatory Visit: Admitting: Occupational Therapy

## 2024-04-16 DIAGNOSIS — R41841 Cognitive communication deficit: Secondary | ICD-10-CM | POA: Diagnosis not present

## 2024-04-16 DIAGNOSIS — M6281 Muscle weakness (generalized): Secondary | ICD-10-CM

## 2024-04-16 NOTE — Therapy (Signed)
 OUTPATIENT SPEECH LANGUAGE PATHOLOGY  TREATMENT   Patient Name: Ashley Collins MRN: [id number] DOB:1961/04/08, 63 y.o., female Today's Date: 04/16/2024  PCP: Gaetana Haddock, NP  REFERRING PROVIDER: same   End of Session - 04/16/24 1536     Visit Number 15    Number of Visits 24    Date for Recertification  05/14/24    SLP Start Time 1445    SLP Stop Time  1530    SLP Time Calculation (min) 45 min    Activity Tolerance Patient tolerated treatment well           Patient Active Problem List   Diagnosis Date Noted   Acute CVA (cerebrovascular accident) (HCC) 12/19/2023   Hypokalemia 12/19/2023   AKI (acute kidney injury) 12/19/2023   Leukocytosis 12/19/2023   Hyperlipidemia, unspecified 12/19/2023   Essential hypertension 12/19/2023   Vision changes 12/19/2023   Lymphedema 05/22/2023   Chronic venous insufficiency 05/22/2023   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 replacement therapy 04/10/2023   Insomnia due to medical condition 04/25/2022   GAD (generalized anxiety disorder) 02/02/2022   Other specified depressive episodes 02/02/2022   Long-term current use of benzodiazepine 02/02/2022   Migraine with aura and without status migrainosus, not intractable 11/03/2020   Arrhythmia 08/06/2020   Status migrainosus 02/25/2019   Osteoporosis 04/09/2013   Vitamin D deficiency 04/09/2013   DDD (degenerative disc disease), lumbosacral 02/08/2005    ONSET DATE: 12/19/23   REFERRING DIAG: CVA- memory deficits  THERAPY DIAG:  Cognitive communication deficit  Rationale for Evaluation and Treatment Rehabilitation  SUBJECTIVE:   SUBJECTIVE STATEMENT: Pt alert, pleasant, and cooperative. Pt accompanied by: self and significant other  PERTINENT HISTORY & DIAGNOSTIC FINDINGS: Pt is 63 y.o. female who presents today for a cognitive-communication evaluation in setting of stroke. MRI 12/18/23 1. 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 No associated  hemorrhage or mass effect. PMHx as outlined above.  PAIN:  Are you having pain? No   FALLS: Has patient fallen in last 6 months?  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   PATIENT GOALS  to return to PLOF   OBJECTIVE:  TODAY'S TREATMENT: Pt seen for skilled ST targeting memory. Pt recalled 7/7 pictured items from last session independently. Pt completed immediate recall with distraction task (distractions of >2 minutes) with 100% accuracy for 2 items with min cues for use of memory strategies.   Discussed potential compensations for returning to work (e.g. keeping to do list on phone, using Siri for efficiency). Pt continues to express frustration with CLOF. Supportive counseling provided as appropriate.   PATIENT EDUCATION: Education details: as above Person educated: Patient and Spouse Education method: Explanation; Handout Education comprehension: verbalized understanding  HOME EXERCISE PROGRAM:        Practice narrating actions  Telling self to mentally focus  Continue calendar use, blocking schedule, and journaling  Scheduling activity     GOALS:  Goals reviewed with patient? Yes  SHORT TERM GOALS: Target date: 10 sessions  Pt will complete PROM re: memory.  Baseline: Goal status: MET   2.  Pt will endorse successful implementation of at least x2 compensations for attention and memory.  Baseline:  Goal status: MET  3.  With Moderate A, patient will establish external aid for memory/executive function and bring  to more than 50% of therapy sessions.    Baseline:  Goal status: MET    LONG TERM GOALS: Target date: 12 weeks  Pt will endorse improvement in cognitive-communication per PROM.  Baseline:  Goal status: PROGRESSING  2.   Pt and/or husband will demonstrate understanding of ways to promote and support cognitive-communication outside of SLP sessions.  Baseline:  Goal status: PROGRESSING   ASSESSMENT:  CLINICAL IMPRESSION:  Pt is 63 y.o. female who presents today for a cognitive-communication treatment in setting of stroke. Initial assessment completed via formal means (Cognitive-Linguistic Quick Test) and PROM (Neuro-QoL Adult Cognitive Function v2.0). Pt presents with at least mild cognitive-communication deficits affecting attention and visuospatial skills per CLQT as well as memory and executive functioning per PROM. Suspect CLQT is not as sensitive to higher level cognitive-communication deficits appreciated by pt during iADLs. See details of today's tx as outlined above. Recommend skilled ST services targeting above mentioned deficits to improve QoL and performance on ADLs/IADLs.  OBJECTIVE IMPAIRMENTS include attention, memory, executive functioning, and visuospatial deficits. These impairments are limiting patient from return to work, managing appointments, household responsibilities, and ADLs/IADLs. Factors affecting potential to achieve goals and functional outcome are medical prognosis.. Patient will benefit from skilled SLP services to address above impairments and improve overall function.  REHAB POTENTIAL: Good  PLAN: SLP FREQUENCY: 2x/week  SLP DURATION: 12 weeks  PLANNED INTERVENTIONS: Cueing hierachy, Cognitive reorganization, Internal/external aids, Functional tasks, SLP instruction and feedback, Compensatory strategies, and Patient/family education    Delon Bangs, M.S., CCC-SLP Speech-Language Pathologist [facility] [phone] [place])  [facility] [address] [place], [place], [address] Phone: [phone]   Fax:  [phone]

## 2024-04-17 ENCOUNTER — Ambulatory Visit: Admitting: Occupational Therapy

## 2024-04-17 ENCOUNTER — Ambulatory Visit

## 2024-04-17 DIAGNOSIS — R41841 Cognitive communication deficit: Secondary | ICD-10-CM

## 2024-04-17 DIAGNOSIS — M6281 Muscle weakness (generalized): Secondary | ICD-10-CM

## 2024-04-17 NOTE — Therapy (Signed)
 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   Cardiac arrhythmia    Nonspecific ST T wave changes on EKG   Chronic venous insufficiency of lower extremity    Complication of anesthesia    nausea and vomiting   DDD (degenerative disc disease), lumbosacral    Essential hypertension    Headache    History of kidney stones    Hyperlipidemia    Kidney stones    Lymphedema    Migraines    Osteoporosis    PONV (postoperative nausea and vomiting)    Right ureteral stone    Vitamin B12 deficiency    Vitamin D deficiency    Past Surgical History:  Procedure Laterality Date   AUGMENTATION MAMMAPLASTY     CESAREAN SECTION     x 4   COLONOSCOPY WITH PROPOFOL  N/A 03/31/2022   Procedure: COLONOSCOPY WITH PROPOFOL ;  Surgeon: Onita Elspeth Sharper, DO;  Location: [facility];  Service: Gastroenterology;  Laterality: N/A;   CYSTOSCOPY W/ RETROGRADES Bilateral 07/10/2020   Procedure: CYSTOSCOPY WITH RETROGRADE PYELOGRAM;  Surgeon: Francisca Redell BROCKS, MD;  Location: [facility] ORS;  Service: Urology;  Laterality: Bilateral;   CYSTOSCOPY/URETEROSCOPY/HOLMIUM LASER/STENT PLACEMENT     CYSTOSCOPY/URETEROSCOPY/HOLMIUM LASER/STENT PLACEMENT Bilateral 07/10/2020   Procedure: CYSTOSCOPY/URETEROSCOPY/HOLMIUM LASER/STENT PLACEMENT;  Surgeon: Francisca Redell BROCKS, MD;  Location: [facility] ORS;  Service: Urology;  Laterality:  Bilateral;   CYSTOSCOPY/URETEROSCOPY/HOLMIUM LASER/STENT PLACEMENT Right 08/25/2023   Procedure: CYSTOSCOPY/URETEROSCOPY/HOLMIUM LASER;  Surgeon: Francisca Redell BROCKS, MD;  Location: [facility] OR[facility];  Service: Urology;  Laterality: Right;   CYSTOSCOPY/URETEROSCOPY/HOLMIUM LASER/STENT PLACEMENT Right 12/15/2023   Procedure: CYSTOSCOPY/URETEROSCOPY/HOLMIUM LASER;  Surgeon: Francisca Redell BROCKS, MD;  Location: [facility] OR[facility];  Service: Urology;  Laterality: Right;   EXTRACORPOREAL SHOCK WAVE LITHOTRIPSY     x 10 plus   Eye Lift     FACIAL COSMETIC SURGERY     GANGLION CYST EXCISION Right 12/21/2021   Procedure: REMOVAL GANGLION OF WRIST;  Surgeon: Kathlynn Sharper, MD;  Location: [facility] ORS;  Service: Orthopedics;  Laterality: Right;   LIPOSUCTION     TONSILLECTOMY     TRIGGER FINGER RELEASE Right 12/21/2021   Procedure: RELEASE TRIGGER FINGER/A-1 PULLEY;  Surgeon: Kathlynn Sharper, MD;  Location: [facility] ORS;  Service: Orthopedics;  Laterality: Right;   URETEROSCOPY WITH HOLMIUM LASER LITHOTRIPSY     Patient Active Problem List   Diagnosis Date Noted   Acute CVA (cerebrovascular accident) (HCC) 12/19/2023   Hypokalemia 12/19/2023   AKI (acute kidney injury) 12/19/2023   Leukocytosis 12/19/2023   Hyperlipidemia, unspecified 12/19/2023   Essential hypertension 12/19/2023   Vision changes 12/19/2023   Lymphedema 05/22/2023   Chronic venous insufficiency 05/22/2023   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 replacement therapy 04/10/2023   Insomnia due to medical condition 04/25/2022  GAD (generalized anxiety disorder) 02/02/2022   Other specified depressive episodes 02/02/2022   Long-term current use of benzodiazepine 02/02/2022   Migraine with aura and without status migrainosus, not intractable 11/03/2020   Arrhythmia 08/06/2020   Status migrainosus 02/25/2019   Osteoporosis 04/09/2013   Vitamin D deficiency 04/09/2013   DDD (degenerative disc disease), lumbosacral 02/08/2005   ONSET DATE: 12/18/2023  REFERRING DIAG:    THERAPY DIAG:  Muscle weakness (generalized)  Rationale for Evaluation and Treatment: Rehabilitation  SUBJECTIVE:  SUBJECTIVE STATEMENT: Patient reports  that she may have to look for a different line of work if she is not able to do her job. Pt. 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  Pt accompanied by: significant other  PERTINENT HISTORY: Pt. has Hx of stroke with onset 12/18/2023. Pt. PMHx includes: HTN, Hyperlipidemia, Kidney Stones, near syncopal event, loss of vision.  PRECAUTIONS: None  WEIGHT BEARING RESTRICTIONS: None  PAIN:  Are you having pain? No  FALLS: Has patient fallen in last 6 months? Yes. Number of falls    LIVING ENVIRONMENT: Lives with: lives with their family Lives in: [place], [place] Stairs: No, inside the house, yes but does not use Has following equipment at home:   PLOF: Independent  PATIENT GOALS: To be able to see  OBJECTIVE:   HAND DOMINANCE: Right  ADLs:  Eating: Drinking from straw is different, able to use utensils.  Grooming: Fatigues completing hair care.  UB Dressing: independent, if clothes are in front of her she can find clothing LB Dressing: Independent Toileting: Independent Bathing: Independent Tub Shower transfers: Independent Equipment: none Has difficulty with using cell phone  IADLs: Shopping: Does not typically go out shopping, and has not tried to. Light housekeeping: Pt. reports that she does not typically do house cleaning. Pt. has been able to unpack belongings from boxes due to her recent move in. Meal Prep: Pt. reports that is does not currently cook, however reports that she probably could. Has difficulty opening bottle caps/lids. Community mobility: Requires assistance from husband to navigate  through buildings, negotiate stairs-Pt. With increased fear of falling  Medication management: Able to push down medication bottles to open  Financial management: No change in the  process-uses automatic bill pay system.  Has difficulty with using cell phone Handwriting: N/T Work: Pt. was actively working managing a Health visitor, works a lot of hours  MOBILITY STATUS: Independent and Needs Assist: Requires hand on hand assistance with nvigating through environments.   POSTURE COMMENTS:  No Significant postural limitations Sitting balance: Good  ACTIVITY TOLERANCE: Activity tolerance: Good  FUNCTIONAL OUTCOME MEASURES:   UPPER EXTREMITY ROM:    Active ROM Right Eval WFL Left Eval [facility]  Shoulder flexion    Shoulder abduction    Shoulder adduction    Shoulder extension    Shoulder internal rotation    Shoulder external rotation    Elbow flexion    Elbow extension    Wrist flexion    Wrist extension    Wrist ulnar deviation    Wrist radial deviation    Wrist pronation    Wrist supination    (Blank rows = not tested)  UPPER EXTREMITY MMT:     MMT Right eval Right 01/29/24 Right  04/04/24 Left eval Left 01/29/24 Left 04/04/24  Shoulder flexion 4-/5 5/5 5/5 4-/5 5/5 5/5  Shoulder abduction 4-/5 5/5 5/5 4-/5 5/5 5/5  Shoulder adduction        Shoulder  extension        Shoulder internal rotation        Shoulder external rotation        Middle trapezius        Lower trapezius        Elbow flexion 5/5 5/5 5/5 5/5 5/5 5/5  Elbow extension 5/5 5/5 5/5 5/5 5/5 5/5  Wrist flexion 4/5 5/5 5/5 4-/5 5/5 5/5  Wrist extension 4/5 5/5 5/5 4-/5 5/5 5/5  Wrist ulnar deviation        Wrist radial deviation        Wrist pronation        Wrist supination        (Blank rows = not tested)  HAND FUNCTION: Grip strength: Right: 39 lbs; Left: 28 lbs, Lateral pinch: Right: 11 lbs, Left: 9 lbs, and 3 point pinch: Right: 9 lbs, Left: 9 lbs  01/29/24 Grip strength: Right: 39 lbs; Left: 36 lbs, Lateral pinch: Right: 14 lbs, Left: 12 lbs, and 3 point pinch: Right: 12 lbs, Left: 11 lbs  04/04/24:  Grip strength: Right: 37 lbs; Left: 42  lbs    COORDINATION: 9 Hole Peg test: Right: 30 sec; Left: 36 sec  01/29/24 9 Hole Peg test: Right: 24 sec; Left: 23 sec.  04/04/24:  9 Hole Peg test: Right: 28 sec; Left: 33 sec.      SENSATION: WFL  EDEMA:   MUSCLE TONE:   COGNITION: Overall cognitive status: Within functional limits for tasks assessed  VISION: Subjective report: Pt. reports changes in her vision at onset of stroke, starting with blurry vision, resulting in vision loss in the R eye.  Baseline vision: Pt. Is able to visually track  Visual history: Hx of eye surgery  VISION ASSESSMENT: TBD  PERCEPTION: WFL  PRAXIS: WFL  OBSERVATIONS:                                                                                                                    TREATMENT DATE: 04/17/24   Therapeutic activities:  -Facilitated right hand progressive right gross grip strengthening using a 23.4# gross gripper, and sustaining grip while reaching up through multiple planes to discard the pegs into container- 2 trials were completed. -Facilitated Lateral, and 3pt. pinch strengthening using yellow, red, green, and blue level resistive clips.  -Facilitated translatory movements moving clips from the lateral pinch position to the 3pt. pinch position in preparation for securely placing them on the dowel to promote hand function skills. -Facilitated right hand [facility] skills using a 3pt. grasp for grasping resistive cubes from a resistive board placed at a vertical angle. -Facilitated compensatory strategies for visual scanning, visual search strategies, and visual memory skills for tabletop ADL/IADL tasks.     HOME EXERCISE PROGRAM:  Ida Theraputty exercises for  hand strengthening at bridge -Visual perceptual: Flip flop target design patterns. -Visual memory tasks -Writing tasks -visual motor tasks.  GOALS: Goals reviewed with patient? Yes  SHORT TERM GOALS: Target date: 04/17/2024  Pt. Will independently  utilize HEP for hand strength, coordination, and visual compensatory strategies ADL/ADLs Baseline: 04/12/23: Independent, continue 03/06/24: Independent Eval: No  current HEP  Goal status: Ongoing   LONG TERM GOALS: Target date: 05/29/2024   Pt. Will be able to independently implement visual scanning/visual search compensatory strategies for tasks within her extra personal space navigating through community environments 100% of the time.  Baseline: 01/29/24:  Independent  100% of the time within the therapy gym, and hallway. Eval: Pt. Requires increased assist to navigate through community environments. Goal status: Achieved  2.  Pt. Will be able to independently utilize visual scanning/visual search strategies  during ADLs/IADLs within her near space, and during tabletop tasks, and tasks at a vertical plane with 100% accuracy. Baseline: 04/11/24: 80% simple to moderately complex visual scanning tasks however 70% accuracy for complex visual scanning tasks, and scanning at a vertical plane. 03/06/24: Initiates visual scanning 85% of the time  01/29/24: 75% Eval: Pt. Education to be provided.  Goal status: revised-04/11/24  3.  Pt. Will be able to independently initiate visual scanning techniques in her own environment to reduce risk of falls.  Baseline: 01/29/24: Education was provided, Pt. is utlizing visual compensatory strategies/visual scanning within her home. Eval: Pt. Education to be provided.  Goal status: Achieved  4.  Pt. Will increase BUE Grip Strength by 5# to be able to independently and securely hold objects for ADL/IADL use. Baseline:  04/11/24: Continue- recent revision 04/04/2024: Grip strength: Right: 37 lbs; Left: 42 lbs patient is dropping items from her hands 01/29/24: Grip strength: Right: 39 lbs; Left: 28 lbs, Eval: Right Grip: 39#, Left Grip: 28# Goal status: Ongoing-recent revision  5.  Pt. Will increase L Lateral Pinch strength by 2# to be able to open bottles. Baseline:  04/04/2024: Grip strength: Right: 37 lbs; Left: 42 lbs 01/29/24: Lateral pinch: Right: 14 lbs, Left: 12 lbs Eval: Lateral pinch: Right: 11 lbs, Left: 9 lbs  Goal status: Achieved  6.  Pt. Will increase BUE strength for shoulder flexion/abduction by 2 mm grades to independently complete hair care tasks. Baseline: 03/16/2024: 5/5 01/29/24: 5/5 overall Eval: Right Shoulder Flexion: 4-/5, Left Shoulder Flexion:4-/5, Right Shoulder Abduction: 4-/5, Left Shoulder Abduction: 4-/5 Goal status: Achieved  7.  Pt will be able to independently and efficiently manipulate medication without dropping them.  Baseline: 04/11/24: Continue, recent revision 04/04/2024: 9 Hole Peg test: Right: 28 sec; Left: 33 sec. 01/29/24: Independent 01/15/24: Daughter currently manages medication set up.  Goal status: Ongoing, recent revision  8. Pt will increase typing speed to 20-30 words per minute with at least 90% accuracy to work towards more efficient typing for job related  responsibilities.  Baseline: 04/11/24: TBD 03/06/24: 10 wpm with 92% accuracy. 01/29/24: Continue 01/15/24: 8wpm x88% accuracy=7 wpm  Goal status: Ongoing  9. Pt will be able to scan small print on store receipts to check for errors with 100% accuracy using visual compensation strategies as needed.  Baseline:04/11/24: Pt. Continues to present with difficulty with visual scanning small  small items efficiently. 03/06/24: Pt. Continues to present with difficulty with visual scanning small  small items efficiently. 01/29/24: Pt. Continues to have difficulty scanning small print items. 01/15/24: Not yet attempted; required for return to work/job responsibility  Goal status:  Ongoing  10. Pt. will independently identify 8 items consistently from visual visual memory in preparation or ADLs.    Baseline: 04/11/24: 5-7 items  while seated at the tabletop. 03/06/24: Pt. is able to identify  up to 6 picture items while seated at the tabletop. Pt. is able to consistently  Identify 4 letters from visual memory when attention is divided.  01/29/24: Pt. is able to identify 6 items consistently from visual memory.    Gaol status: Ongoing      11. Pt. will be independently write 4 sentences  efficiently with 100% legibility, no deviation from writing on a blank line, and appropriate spacing between letters.              Baseline: 04/11/24: Continue 03/06/24: 4 lines with 75% legibility, positive deviation below the line, and excessive spacing between the words.              Goal status: Ongoing  ASSESSMENT:  CLINICAL IMPRESSION:  Pt. reports having a follow-up eye appointment at [facility] tomorrow. Pt. tolerated grip strength, and pinch strengthening exercises well today. Pt. has progressed to be able to locate all images with 100% accuracy on the Bell task. Pt. was able to recall 9/14 images from visual memory. Pt. was able to recall 2 additional images when reviewing the images a 2nd time. Pt. continues to benefit from Occupational Therapy services to improve her ability to use visual compensatory strategies, and improve overall BUE functioning in order to improve engagement in, and maximize overall independence with ADL, and IADL tasks.    PERFORMANCE DEFICITS: in functional skills including ADLs, IADLs, coordination, dexterity, Fine motor control, and vision, and psychosocial skills including coping strategies, environmental adaptation, habits, interpersonal interactions, and routines and behaviors.   IMPAIRMENTS: are limiting patient from ADLs, IADLs, rest and sleep, work, leisure, and social participation.   CO-MORBIDITIES: may have co-morbidities  that affects occupational performance. Patient will benefit from skilled OT to address above impairments and improve overall function.  MODIFICATION OR ASSISTANCE TO COMPLETE EVALUATION: Min-Moderate modification of tasks or assist with assess necessary to complete an evaluation.  OT OCCUPATIONAL PROFILE AND HISTORY:  Detailed assessment: Review of records and additional review of physical, cognitive, psychosocial history related to current functional performance.  CLINICAL DECISION MAKING: Moderate - several treatment options, min-mod task modification necessary  REHAB POTENTIAL: Good  EVALUATION COMPLEXITY: Moderate    PLAN:  OT FREQUENCY: 2x/week  OT DURATION: 12 weeks  PLANNED INTERVENTIONS: 97168 OT Re-evaluation, 97535 self care/ADL training, 02889 therapeutic exercise, 97530 therapeutic activity, 97112 neuromuscular re-education, visual/perceptual remediation/compensation, patient/family education, and DME and/or AE instructions  RECOMMENDED OTHER SERVICES: ST  CONSULTED AND AGREED WITH PLAN OF CARE: Patient and family member/caregiver  PLAN FOR NEXT SESSION: Treatment  Richardson Otter, MS, OTR/L 04/17/24

## 2024-04-17 NOTE — Therapy (Addendum)
 Occupational Therapy Neuro Treatment Note     Patient Name: Ashley Collins MRN: [id number] DOB:02/26/1961, 63 y.o., female Today's Date: 04/17/2024  PCP: Harvey Gaetana CROME, NP REFERRING PROVIDER: Harvey Gaetana CROME, NP   OT End of Session - 04/17/24 1330     Visit Number 31    Number of Visits 48    Date for Recertification  05/29/24    OT Start Time 1400    OT Stop Time 1445    OT Time Calculation (min) 45 min    Activity Tolerance Patient tolerated treatment well    Behavior During Therapy WFL for tasks assessed/performed              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   Cardiac arrhythmia    Nonspecific ST T wave changes on EKG   Chronic venous insufficiency of lower extremity    Complication of anesthesia    nausea and vomiting   DDD (degenerative disc disease), lumbosacral    Essential hypertension    Headache    History of kidney stones    Hyperlipidemia    Kidney stones    Lymphedema    Migraines    Osteoporosis    PONV (postoperative nausea and vomiting)    Right ureteral stone    Vitamin B12 deficiency    Vitamin D deficiency    Past Surgical History:  Procedure Laterality Date   AUGMENTATION MAMMAPLASTY     CESAREAN SECTION     x 4   COLONOSCOPY WITH PROPOFOL  N/A 03/31/2022   Procedure: COLONOSCOPY WITH PROPOFOL ;  Surgeon: Onita Elspeth Sharper, DO;  Location: [facility];  Service: Gastroenterology;  Laterality: N/A;   CYSTOSCOPY W/ RETROGRADES Bilateral 07/10/2020   Procedure: CYSTOSCOPY WITH RETROGRADE PYELOGRAM;  Surgeon: Francisca Redell BROCKS, MD;  Location: [facility];  Service: Urology;  Laterality: Bilateral;   CYSTOSCOPY/URETEROSCOPY/HOLMIUM LASER/STENT PLACEMENT     CYSTOSCOPY/URETEROSCOPY/HOLMIUM LASER/STENT PLACEMENT Bilateral 07/10/2020   Procedure: CYSTOSCOPY/URETEROSCOPY/HOLMIUM LASER/STENT PLACEMENT;  Surgeon: Francisca Redell BROCKS, MD;  Location: [facility];  Service:  Urology;  Laterality: Bilateral;   CYSTOSCOPY/URETEROSCOPY/HOLMIUM LASER/STENT PLACEMENT Right 08/25/2023   Procedure: CYSTOSCOPY/URETEROSCOPY/HOLMIUM LASER;  Surgeon: Francisca Redell BROCKS, MD;  Location: [facility];  Service: Urology;  Laterality: Right;   CYSTOSCOPY/URETEROSCOPY/HOLMIUM LASER/STENT PLACEMENT Right 12/15/2023   Procedure: CYSTOSCOPY/URETEROSCOPY/HOLMIUM LASER;  Surgeon: Francisca Redell BROCKS, MD;  Location: [facility];  Service: Urology;  Laterality: Right;   EXTRACORPOREAL SHOCK WAVE LITHOTRIPSY     x 10 plus   Eye Lift     FACIAL COSMETIC SURGERY     GANGLION CYST EXCISION Right 12/21/2021   Procedure: REMOVAL GANGLION OF WRIST;  Surgeon: Kathlynn Sharper, MD;  Location: [facility];  Service: Orthopedics;  Laterality: Right;   LIPOSUCTION     TONSILLECTOMY     TRIGGER FINGER RELEASE Right 12/21/2021   Procedure: RELEASE TRIGGER FINGER/A-1 PULLEY;  Surgeon: Kathlynn Sharper, MD;  Location: [facility];  Service: Orthopedics;  Laterality: Right;   URETEROSCOPY WITH HOLMIUM LASER LITHOTRIPSY     Patient Active Problem List   Diagnosis Date Noted   Acute CVA (cerebrovascular accident) (HCC) 12/19/2023   Hypokalemia 12/19/2023   AKI (acute kidney injury) 12/19/2023   Leukocytosis 12/19/2023   Hyperlipidemia, unspecified 12/19/2023   Essential hypertension 12/19/2023   Vision changes 12/19/2023   Lymphedema 05/22/2023   Chronic venous insufficiency 05/22/2023   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 replacement therapy 04/10/2023  Insomnia due to medical condition 04/25/2022   GAD (generalized anxiety disorder) 02/02/2022   Other specified depressive episodes 02/02/2022   Long-term current use of benzodiazepine 02/02/2022   Migraine with aura and without status migrainosus, not intractable 11/03/2020   Arrhythmia 08/06/2020   Status migrainosus 02/25/2019   Osteoporosis 04/09/2013   Vitamin D deficiency 04/09/2013   DDD (degenerative disc disease), lumbosacral 02/08/2005   ONSET DATE:  12/18/2023  REFERRING DIAG:   THERAPY DIAG:  Muscle weakness (generalized)  Rationale for Evaluation and Treatment: Rehabilitation  SUBJECTIVE:  SUBJECTIVE STATEMENT: Patient reports having a follow up eye appointment at [facility] this week.  Pt accompanied by: significant other  PERTINENT HISTORY: Pt. has Hx of stroke with onset 12/18/2023. Pt. PMHx includes: HTN, Hyperlipidemia, Kidney Stones, near syncopal event, loss of vision.  PRECAUTIONS: None  WEIGHT BEARING RESTRICTIONS: None  PAIN:  Are you having pain? No  FALLS: Has patient fallen in last 6 months? Yes. Number of falls    LIVING ENVIRONMENT: Lives with: lives with their family Lives in: [place], [place] Stairs: No, inside the house, yes but does not use Has following equipment at home:   PLOF: Independent  PATIENT GOALS: To be able to see  OBJECTIVE:   HAND DOMINANCE: Right  ADLs:  Eating: Drinking from straw is different, able to use utensils.  Grooming: Fatigues completing hair care.  UB Dressing: independent, if clothes are in front of her she can find clothing LB Dressing: Independent Toileting: Independent Bathing: Independent Tub Shower transfers: Independent Equipment: none Has difficulty with using cell phone  IADLs: Shopping: Does not typically go out shopping, and has not tried to. Light housekeeping: Pt. reports that she does not typically do house cleaning. Pt. has been able to unpack belongings from boxes due to her recent move in. Meal Prep: Pt. reports that is does not currently cook, however reports that she probably could. Has difficulty opening bottle caps/lids. Community mobility: Requires assistance from husband to navigate  through buildings, negotiate stairs-Pt. With increased fear of falling  Medication management: Able to push down medication bottles to open  Financial management: No change in the process-uses automatic bill pay system.  Has difficulty with using cell  phone Handwriting: N/T Work: Pt. was actively working managing a Health visitor, works a lot of hours  MOBILITY STATUS: Independent and Needs Assist: Requires hand on hand assistance with nvigating through environments.   POSTURE COMMENTS:  No Significant postural limitations Sitting balance: Good  ACTIVITY TOLERANCE: Activity tolerance: Good  FUNCTIONAL OUTCOME MEASURES:   UPPER EXTREMITY ROM:    Active ROM Right Eval WFL Left Eval [facility]  Shoulder flexion    Shoulder abduction    Shoulder adduction    Shoulder extension    Shoulder internal rotation    Shoulder external rotation    Elbow flexion    Elbow extension    Wrist flexion    Wrist extension    Wrist ulnar deviation    Wrist radial deviation    Wrist pronation    Wrist supination    (Blank rows = not tested)  UPPER EXTREMITY MMT:     MMT Right eval Right 01/29/24 Right  04/04/24 Left eval Left 01/29/24 Left 04/04/24  Shoulder flexion 4-/5 5/5 5/5 4-/5 5/5 5/5  Shoulder abduction 4-/5 5/5 5/5 4-/5 5/5 5/5  Shoulder adduction        Shoulder extension        Shoulder internal rotation  Shoulder external rotation        Middle trapezius        Lower trapezius        Elbow flexion 5/5 5/5 5/5 5/5 5/5 5/5  Elbow extension 5/5 5/5 5/5 5/5 5/5 5/5  Wrist flexion 4/5 5/5 5/5 4-/5 5/5 5/5  Wrist extension 4/5 5/5 5/5 4-/5 5/5 5/5  Wrist ulnar deviation        Wrist radial deviation        Wrist pronation        Wrist supination        (Blank rows = not tested)  HAND FUNCTION: Grip strength: Right: 39 lbs; Left: 28 lbs, Lateral pinch: Right: 11 lbs, Left: 9 lbs, and 3 point pinch: Right: 9 lbs, Left: 9 lbs  01/29/24 Grip strength: Right: 39 lbs; Left: 36 lbs, Lateral pinch: Right: 14 lbs, Left: 12 lbs, and 3 point pinch: Right: 12 lbs, Left: 11 lbs  04/04/24:  Grip strength: Right: 37 lbs; Left: 42 lbs    COORDINATION: 9 Hole Peg test: Right: 30 sec; Left: 36 sec  01/29/24 9 Hole Peg  test: Right: 24 sec; Left: 23 sec.  04/04/24:  9 Hole Peg test: Right: 28 sec; Left: 33 sec.      SENSATION: WFL  EDEMA:   MUSCLE TONE:   COGNITION: Overall cognitive status: Within functional limits for tasks assessed  VISION: Subjective report: Pt. reports changes in her vision at onset of stroke, starting with blurry vision, resulting in vision loss in the R eye.  Baseline vision: Pt. Is able to visually track  Visual history: Hx of eye surgery  VISION ASSESSMENT: TBD  PERCEPTION: WFL  PRAXIS: WFL  OBSERVATIONS:                                                                                                                    TREATMENT DATE: 04/16/24   Therapeutic activities:  -Facilitated right hand progressive right gross grip strengthening with 2 red level resistive bands.  Patient worked on Systems analyst for 10-20 reps with a gripper in both vertical and horizontal planes. -Facilitated Lateral, and 3pt. pinch strengthening using yellow, red, green, and blue, and black level resistive clips.  -Facilitated translatory movements moving clips from the lateral pinch position to the 3pt. pinch position in preparation for securely placing them on the dowel to promote hand function skills. -Facilitated visual perceptual skills using flip flop targets, formulating mirror images of designs in multiple positions. Pt. was able to complete simple designs. Pt. required fewer cues.     HOME EXERCISE PROGRAM:  Ida Theraputty exercises for  hand strengthening at bridge -Visual perceptual: Flip flop target design patterns. -Visual memory tasks -Writing tasks -visual motor tasks.  GOALS: Goals reviewed with patient? Yes  SHORT TERM GOALS: Target date: 04/17/2024    Pt. Will independently utilize HEP for hand strength, coordination, and visual compensatory strategies ADL/ADLs Baseline: 04/12/23: Independent, continue 03/06/24: Independent Eval: No  current HEP   Goal  status: Ongoing   LONG TERM GOALS: Target date: 05/29/2024   Pt. Will be able to independently implement visual scanning/visual search compensatory strategies for tasks within her extra personal space navigating through community environments 100% of the time.  Baseline: 01/29/24:  Independent  100% of the time within the therapy gym, and hallway. Eval: Pt. Requires increased assist to navigate through community environments. Goal status: Achieved  2.  Pt. Will be able to independently utilize visual scanning/visual search strategies  during ADLs/IADLs within her near space, and during tabletop tasks, and tasks at a vertical plane with 100% accuracy. Baseline: 04/11/24: 80% simple to moderately complex visual scanning tasks however 70% accuracy for complex visual scanning tasks, and scanning at a vertical plane. 03/06/24: Initiates visual scanning 85% of the time  01/29/24: 75% Eval: Pt. Education to be provided.  Goal status: revised-04/11/24  3.  Pt. Will be able to independently initiate visual scanning techniques in her own environment to reduce risk of falls.  Baseline: 01/29/24: Education was provided, Pt. is utlizing visual compensatory strategies/visual scanning within her home. Eval: Pt. Education to be provided.  Goal status: Achieved  4.  Pt. Will increase BUE Grip Strength by 5# to be able to independently and securely hold objects for ADL/IADL use. Baseline:  04/11/24: Continue- recent revision 04/04/2024: Grip strength: Right: 37 lbs; Left: 42 lbs patient is dropping items from her hands 01/29/24: Grip strength: Right: 39 lbs; Left: 28 lbs, Eval: Right Grip: 39#, Left Grip: 28# Goal status: Ongoing-recent revision  5.  Pt. Will increase L Lateral Pinch strength by 2# to be able to open bottles. Baseline: 04/04/2024: Grip strength: Right: 37 lbs; Left: 42 lbs 01/29/24: Lateral pinch: Right: 14 lbs, Left: 12 lbs Eval: Lateral pinch: Right: 11 lbs, Left: 9 lbs  Goal status:  Achieved  6.  Pt. Will increase BUE strength for shoulder flexion/abduction by 2 mm grades to independently complete hair care tasks. Baseline: 03/16/2024: 5/5 01/29/24: 5/5 overall Eval: Right Shoulder Flexion: 4-/5, Left Shoulder Flexion:4-/5, Right Shoulder Abduction: 4-/5, Left Shoulder Abduction: 4-/5 Goal status: Achieved  7.  Pt will be able to independently and efficiently manipulate medication without dropping them.  Baseline: 04/11/24: Continue, recent revision 04/04/2024: 9 Hole Peg test: Right: 28 sec; Left: 33 sec. 01/29/24: Independent 01/15/24: Daughter currently manages medication set up.  Goal status: Ongoing, recent revision  8. Pt will increase typing speed to 20-30 words per minute with at least 90% accuracy to work towards more efficient typing for job related  responsibilities.  Baseline: 04/11/24: TBD 03/06/24: 10 wpm with 92% accuracy. 01/29/24: Continue 01/15/24: 8wpm x88% accuracy=7 wpm  Goal status: Ongoing  9. Pt will be able to scan small print on store receipts to check for errors with 100% accuracy using visual compensation strategies as needed.  Baseline:04/11/24: Pt. Continues to present with difficulty with visual scanning small  small items efficiently. 03/06/24: Pt. Continues to present with difficulty with visual scanning small  small items efficiently. 01/29/24: Pt. Continues to have difficulty scanning small print items. 01/15/24: Not yet attempted; required for return to work/job responsibility  Goal status:  Ongoing  10. Pt. will independently identify 8 items consistently from visual visual memory in preparation or ADLs.    Baseline: 04/11/24: 5-7 items  while seated at the tabletop. 03/06/24: Pt. is able to identify up to 6 picture items while seated at the tabletop. Pt. is able to consistently Identify 4 letters from visual memory when attention is divided.  01/29/24: Pt.  is able to identify 6 items consistently from visual memory.    Gaol status:  Ongoing      11. Pt. will be independently write 4 sentences  efficiently with 100% legibility, no deviation from writing on a blank line, and appropriate spacing between letters.              Baseline: 04/11/24: Continue 03/06/24: 4 lines with 75% legibility, positive deviation below the line, and excessive spacing between the words.              Goal status: Ongoing  ASSESSMENT:  CLINICAL IMPRESSION:  Pt. tolerated the grip strength, and pinch strengthening exercises well today. Pt. was able to complete visual perceptual flip flip targets/mirror image tasks with fewer cues required. Pt. continues to benefit from Occupational Therapy services to improve her ability to use visual compensatory strategies, and improve overall BUE functioning in order to improve engagement in, and maximize overall independence with ADL, and IADL tasks.    PERFORMANCE DEFICITS: in functional skills including ADLs, IADLs, coordination, dexterity, Fine motor control, and vision, and psychosocial skills including coping strategies, environmental adaptation, habits, interpersonal interactions, and routines and behaviors.   IMPAIRMENTS: are limiting patient from ADLs, IADLs, rest and sleep, work, leisure, and social participation.   CO-MORBIDITIES: may have co-morbidities  that affects occupational performance. Patient will benefit from skilled OT to address above impairments and improve overall function.  MODIFICATION OR ASSISTANCE TO COMPLETE EVALUATION: Min-Moderate modification of tasks or assist with assess necessary to complete an evaluation.  OT OCCUPATIONAL PROFILE AND HISTORY: Detailed assessment: Review of records and additional review of physical, cognitive, psychosocial history related to current functional performance.  CLINICAL DECISION MAKING: Moderate - several treatment options, min-mod task modification necessary  REHAB POTENTIAL: Good  EVALUATION COMPLEXITY: Moderate    PLAN:  OT FREQUENCY:  2x/week  OT DURATION: 12 weeks  PLANNED INTERVENTIONS: 97168 OT Re-evaluation, 97535 self care/ADL training, 02889 therapeutic exercise, 97530 therapeutic activity, 97112 neuromuscular re-education, visual/perceptual remediation/compensation, patient/family education, and DME and/or AE instructions  RECOMMENDED OTHER SERVICES: ST  CONSULTED AND AGREED WITH PLAN OF CARE: Patient and family member/caregiver  PLAN FOR NEXT SESSION: Treatment  Richardson Otter, MS, OTR/L 04/17/24

## 2024-04-17 NOTE — Therapy (Signed)
 OUTPATIENT SPEECH LANGUAGE PATHOLOGY  TREATMENT   Patient Name: Ashley Collins MRN: [id number] DOB:January 27, 1961, 63 y.o., female Today's Date: 04/17/2024  PCP: Gaetana Haddock, NP  REFERRING PROVIDER: same   End of Session - 04/17/24 1227     Visit Number 16    Number of Visits 24    Date for Recertification  05/14/24    SLP Start Time 1100    SLP Stop Time  1145    SLP Time Calculation (min) 45 min    Activity Tolerance Patient tolerated treatment well           Patient Active Problem List   Diagnosis Date Noted   Acute CVA (cerebrovascular accident) (HCC) 12/19/2023   Hypokalemia 12/19/2023   AKI (acute kidney injury) 12/19/2023   Leukocytosis 12/19/2023   Hyperlipidemia, unspecified 12/19/2023   Essential hypertension 12/19/2023   Vision changes 12/19/2023   Lymphedema 05/22/2023   Chronic venous insufficiency 05/22/2023   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 replacement therapy 04/10/2023   Insomnia due to medical condition 04/25/2022   GAD (generalized anxiety disorder) 02/02/2022   Other specified depressive episodes 02/02/2022   Long-term current use of benzodiazepine 02/02/2022   Migraine with aura and without status migrainosus, not intractable 11/03/2020   Arrhythmia 08/06/2020   Status migrainosus 02/25/2019   Osteoporosis 04/09/2013   Vitamin D deficiency 04/09/2013   DDD (degenerative disc disease), lumbosacral 02/08/2005    ONSET DATE: 12/19/23   REFERRING DIAG: CVA- memory deficits  THERAPY DIAG:  Cognitive communication deficit  Rationale for Evaluation and Treatment Rehabilitation  SUBJECTIVE:   SUBJECTIVE STATEMENT: Pt alert, pleasant, and cooperative. Pt accompanied by: self and significant other  PERTINENT HISTORY & DIAGNOSTIC FINDINGS: Pt is 63 y.o. female who presents today for a cognitive-communication evaluation in setting of stroke. MRI 12/18/23 1. 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 No associated  hemorrhage or mass effect. PMHx as outlined above.  PAIN:  Are you having pain? No   FALLS: Has patient fallen in last 6 months?  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   PATIENT GOALS  to return to PLOF   OBJECTIVE:  TODAY'S TREATMENT: Pt participated in wayfinding, immediate/short term recall, divided attention, and functional problem solving task. Pt able to find way to gift shop (following directions by front desk), stay within a shopping budget, anticipate change, and find way back to SLP office without difficulty.   Pt endorsed difficulty retaining written material and TV shows. Will plan to target memory for such functional information in upcoming sessions.    PATIENT EDUCATION: Education details: as above Person educated: Patient and Spouse Education method: Explanation; Handout Education comprehension: verbalized understanding  HOME EXERCISE PROGRAM:        Continue compensations for attention and memory     GOALS:  Goals reviewed with patient? Yes  SHORT TERM GOALS: Target date: 10 sessions  Pt will complete PROM re: memory.  Baseline: Goal status: MET   2.  Pt will endorse successful implementation of at least x2 compensations for attention and memory.  Baseline:  Goal status: MET  3.  With Moderate A, patient will establish external aid for memory/executive function and bring to more than 50% of therapy sessions.    Baseline:  Goal status: MET    LONG TERM  GOALS: Target date: 12 weeks  Pt will endorse improvement in cognitive-communication per PROM.  Baseline:  Goal status: PROGRESSING  2.  Pt and/or husband will demonstrate understanding of ways to promote and support cognitive-communication outside of SLP sessions.   Baseline:  Goal status: PROGRESSING   ASSESSMENT:  CLINICAL IMPRESSION:  Pt is 63 y.o. female who presents today for a cognitive-communication treatment in setting of stroke. Initial assessment completed via formal means (Cognitive-Linguistic Quick Test) and PROM (Neuro-QoL Adult Cognitive Function v2.0). Pt presents with at least mild cognitive-communication deficits affecting attention and visuospatial skills per CLQT as well as memory and executive functioning per PROM. Suspect CLQT is not as sensitive to higher level cognitive-communication deficits appreciated by pt during iADLs. See details of today's tx as outlined above. Recommend skilled ST services targeting above mentioned deficits to improve QoL and performance on ADLs/IADLs.  OBJECTIVE IMPAIRMENTS include attention, memory, executive functioning, and visuospatial deficits. These impairments are limiting patient from return to work, managing appointments, household responsibilities, and ADLs/IADLs. Factors affecting potential to achieve goals and functional outcome are medical prognosis.. Patient will benefit from skilled SLP services to address above impairments and improve overall function.  REHAB POTENTIAL: Good  PLAN: SLP FREQUENCY: 2x/week  SLP DURATION: 12 weeks  PLANNED INTERVENTIONS: Cueing hierachy, Cognitive reorganization, Internal/external aids, Functional tasks, SLP instruction and feedback, Compensatory strategies, and Patient/family education    Delon Bangs, M.S., CCC-SLP Speech-Language Pathologist [facility] [phone] [place])  [place] [facility] [address] [place], [place], [address] Phone: [phone]   Fax:  [phone]

## 2024-04-18 ENCOUNTER — Ambulatory Visit

## 2024-04-18 ENCOUNTER — Encounter: Admitting: Occupational Therapy

## 2024-04-18 ENCOUNTER — Encounter

## 2024-04-18 ENCOUNTER — Other Ambulatory Visit: Payer: Self-pay | Admitting: Psychiatry

## 2024-04-18 ENCOUNTER — Telehealth: Payer: Self-pay

## 2024-04-18 DIAGNOSIS — F411 Generalized anxiety disorder: Secondary | ICD-10-CM

## 2024-04-18 NOTE — Telephone Encounter (Signed)
 Patient called stating that she went to the pharmacy to request a refill of her LORazepam  (ATIVAN ) 0.5 MG tablet and was told that she did not have any on file I called the pharmacy and spoke to Devere and she stated that the patient did not have any please advise  Last visit 02-22-24  Next visit 06-24-24    Preferred Pharmacies   [id number] - [place], [place] - [address] Phone: [phone]  Fax: [phone]

## 2024-04-18 NOTE — Telephone Encounter (Signed)
 Contacted pharmacist, spoke to [place], the prescription refills were made inactive and she said she did not know how.  She took a verbal order to reactivate the 2 refills on file.

## 2024-04-23 ENCOUNTER — Ambulatory Visit: Admitting: Physical Therapy

## 2024-04-23 ENCOUNTER — Ambulatory Visit

## 2024-04-23 ENCOUNTER — Ambulatory Visit: Admitting: Occupational Therapy

## 2024-04-23 DIAGNOSIS — R41841 Cognitive communication deficit: Secondary | ICD-10-CM | POA: Diagnosis not present

## 2024-04-23 DIAGNOSIS — M6281 Muscle weakness (generalized): Secondary | ICD-10-CM

## 2024-04-23 DIAGNOSIS — R278 Other lack of coordination: Secondary | ICD-10-CM

## 2024-04-23 NOTE — Therapy (Signed)
 OUTPATIENT SPEECH LANGUAGE PATHOLOGY  TREATMENT   Patient Name: Ashley Collins MRN: [id number] DOB:January 04, 1961, 63 y.o., female Today's Date: 04/23/2024  PCP: Gaetana Haddock, NP  REFERRING PROVIDER: same   End of Session - 04/23/24 1351     Visit Number 17    Number of Visits 24    Date for Recertification  05/14/24    SLP Start Time 1400    SLP Stop Time  1445    SLP Time Calculation (min) 45 min    Activity Tolerance Patient tolerated treatment well           Patient Active Problem List   Diagnosis Date Noted   Acute CVA (cerebrovascular accident) (HCC) 12/19/2023   Hypokalemia 12/19/2023   AKI (acute kidney injury) 12/19/2023   Leukocytosis 12/19/2023   Hyperlipidemia, unspecified 12/19/2023   Essential hypertension 12/19/2023   Vision changes 12/19/2023   Lymphedema 05/22/2023   Chronic venous insufficiency 05/22/2023   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 replacement therapy 04/10/2023   Insomnia due to medical condition 04/25/2022   GAD (generalized anxiety disorder) 02/02/2022   Other specified depressive episodes 02/02/2022   Long-term current use of benzodiazepine 02/02/2022   Migraine with aura and without status migrainosus, not intractable 11/03/2020   Arrhythmia 08/06/2020   Status migrainosus 02/25/2019   Osteoporosis 04/09/2013   Vitamin D deficiency 04/09/2013   DDD (degenerative disc disease), lumbosacral 02/08/2005    ONSET DATE: 12/19/23   REFERRING DIAG: CVA- memory deficits  THERAPY DIAG:  Cognitive communication deficit  Rationale for Evaluation and Treatment Rehabilitation  SUBJECTIVE:   SUBJECTIVE STATEMENT: Pt alert, pleasant, and cooperative. Pt accompanied by: self and significant other  PERTINENT HISTORY & DIAGNOSTIC FINDINGS: Pt is 63 y.o. female who presents today for a cognitive-communication evaluation in setting of stroke. MRI 12/18/23 1. 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 No associated  hemorrhage or mass effect. PMHx as outlined above.  PAIN:  Are you having pain? No   FALLS: Has patient fallen in last 6 months?  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 Pt watched ~13 minute video and recalled x5 facts with indep use of written cues. Pt tasked with recalling same facts for HEP tomorrow. Discussed ways to code/recall information for next session.    PATIENT EDUCATION: Education details: as above Person educated: Patient and Spouse Education method: Explanation; Handout Education comprehension: verbalized understanding  HOME EXERCISE PROGRAM:        Continue compensations for attention and memory     GOALS:  Goals reviewed with patient? Yes  SHORT TERM GOALS: Target date: 10 sessions  Pt will complete PROM re: memory.  Baseline: Goal status: MET   2.  Pt will endorse successful implementation of at least x2 compensations for attention and memory.  Baseline:  Goal status: MET  3.  With Moderate A, patient will establish external aid for memory/executive function and bring to more than 50% of therapy sessions.    Baseline:  Goal status: MET    LONG TERM GOALS: Target date: 12 weeks  Pt will endorse improvement in cognitive-communication per PROM.  Baseline:  Goal status: PROGRESSING  2.  Pt and/or husband will demonstrate understanding of ways  to promote and support cognitive-communication outside of SLP sessions.  Baseline:  Goal status: PROGRESSING   ASSESSMENT:  CLINICAL IMPRESSION:  Pt is 63 y.o. female who presents today for a cognitive-communication treatment in setting of stroke. Initial assessment completed via  formal means (Cognitive-Linguistic Quick Test) and PROM (Neuro-QoL Adult Cognitive Function v2.0). Pt presents with at least mild cognitive-communication deficits affecting attention and visuospatial skills per CLQT as well as memory and executive functioning per PROM. Suspect CLQT is not as sensitive to higher level cognitive-communication deficits appreciated by pt during iADLs. See details of today's tx as outlined above. Recommend skilled ST services targeting above mentioned deficits to improve QoL and performance on ADLs/IADLs.  OBJECTIVE IMPAIRMENTS include attention, memory, executive functioning, and visuospatial deficits. These impairments are limiting patient from return to work, managing appointments, household responsibilities, and ADLs/IADLs. Factors affecting potential to achieve goals and functional outcome are medical prognosis.. Patient will benefit from skilled SLP services to address above impairments and improve overall function.  REHAB POTENTIAL: Good  PLAN: SLP FREQUENCY: 2x/week  SLP DURATION: 12 weeks  PLANNED INTERVENTIONS: Cueing hierachy, Cognitive reorganization, Internal/external aids, Functional tasks, SLP instruction and feedback, Compensatory strategies, and Patient/family education    Delon Bangs, M.S., CCC-SLP Speech-Language Pathologist [place] [facility] [phone] [place])  [facility] [address] [place], [place], [address] Phone: [phone]   Fax:  [phone]

## 2024-04-23 NOTE — Therapy (Signed)
 Occupational Therapy Neuro Treatment Note     Patient Name: Ashley Collins MRN: [id number] DOB:1961-01-04, 63 y.o., female Today's Date: 04/23/2024  PCP: Harvey Gaetana CROME, NP REFERRING PROVIDER: Harvey Gaetana CROME, NP   OT End of Session - 04/23/24 1732     Visit Number 34    Number of Visits 48    Date for Recertification  05/29/24    OT Start Time 1445    OT Stop Time 1530    OT Time Calculation (min) 45 min    Activity Tolerance Patient tolerated treatment well    Behavior During Therapy WFL for tasks assessed/performed              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   Cardiac arrhythmia    Nonspecific ST T wave changes on EKG   Chronic venous insufficiency of lower extremity    Complication of anesthesia    nausea and vomiting   DDD (degenerative disc disease), lumbosacral    Essential hypertension    Headache    History of kidney stones    Hyperlipidemia    Kidney stones    Lymphedema    Migraines    Osteoporosis    PONV (postoperative nausea and vomiting)    Right ureteral stone    Vitamin B12 deficiency    Vitamin D deficiency    Past Surgical History:  Procedure Laterality Date   AUGMENTATION MAMMAPLASTY     CESAREAN SECTION     x 4   COLONOSCOPY WITH PROPOFOL  N/A 03/31/2022   Procedure: COLONOSCOPY WITH PROPOFOL ;  Surgeon: Onita Elspeth Sharper, DO;  Location: [facility] ENDOSCOPY;  Service: Gastroenterology;  Laterality: N/A;   CYSTOSCOPY W/ RETROGRADES Bilateral 07/10/2020   Procedure: CYSTOSCOPY WITH RETROGRADE PYELOGRAM;  Surgeon: Francisca Redell BROCKS, MD;  Location: [facility] ORS;  Service: Urology;  Laterality: Bilateral;   CYSTOSCOPY/URETEROSCOPY/HOLMIUM LASER/STENT PLACEMENT     CYSTOSCOPY/URETEROSCOPY/HOLMIUM LASER/STENT PLACEMENT Bilateral 07/10/2020   Procedure: CYSTOSCOPY/URETEROSCOPY/HOLMIUM LASER/STENT PLACEMENT;  Surgeon: Francisca Redell BROCKS, MD;  Location: [facility] ORS;  Service:  Urology;  Laterality: Bilateral;   CYSTOSCOPY/URETEROSCOPY/HOLMIUM LASER/STENT PLACEMENT Right 08/25/2023   Procedure: CYSTOSCOPY/URETEROSCOPY/HOLMIUM LASER;  Surgeon: Francisca Redell BROCKS, MD;  Location: [facility] ORS;  Service: Urology;  Laterality: Right;   CYSTOSCOPY/URETEROSCOPY/HOLMIUM LASER/STENT PLACEMENT Right 12/15/2023   Procedure: CYSTOSCOPY/URETEROSCOPY/HOLMIUM LASER;  Surgeon: Francisca Redell BROCKS, MD;  Location: [facility] ORS;  Service: Urology;  Laterality: Right;   EXTRACORPOREAL SHOCK WAVE LITHOTRIPSY     x 10 plus   Eye Lift     FACIAL COSMETIC SURGERY     GANGLION CYST EXCISION Right 12/21/2021   Procedure: REMOVAL GANGLION OF WRIST;  Surgeon: Kathlynn Sharper, MD;  Location: [facility] ORS;  Service: Orthopedics;  Laterality: Right;   LIPOSUCTION     TONSILLECTOMY     TRIGGER FINGER RELEASE Right 12/21/2021   Procedure: RELEASE TRIGGER FINGER/A-1 PULLEY;  Surgeon: Kathlynn Sharper, MD;  Location: [facility] ORS;  Service: Orthopedics;  Laterality: Right;   URETEROSCOPY WITH HOLMIUM LASER LITHOTRIPSY     Patient Active Problem List   Diagnosis Date Noted   Acute CVA (cerebrovascular accident) (HCC) 12/19/2023   Hypokalemia 12/19/2023   AKI (acute kidney injury) 12/19/2023   Leukocytosis 12/19/2023   Hyperlipidemia, unspecified 12/19/2023   Essential hypertension 12/19/2023   Vision changes 12/19/2023   Lymphedema 05/22/2023   Chronic venous insufficiency 05/22/2023   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 replacement therapy 04/10/2023  Insomnia due to medical condition 04/25/2022   GAD (generalized anxiety disorder) 02/02/2022   Other specified depressive episodes 02/02/2022   Long-term current use of benzodiazepine 02/02/2022   Migraine with aura and without status migrainosus, not intractable 11/03/2020   Arrhythmia 08/06/2020   Status migrainosus 02/25/2019   Osteoporosis 04/09/2013   Vitamin D deficiency 04/09/2013   DDD (degenerative disc disease), lumbosacral 02/08/2005   ONSET DATE:  12/18/2023  REFERRING DIAG:   THERAPY DIAG:  Muscle weakness (generalized)  Other lack of coordination  Rationale for Evaluation and Treatment: Rehabilitation  SUBJECTIVE:  SUBJECTIVE STATEMENT:   Pt. reports having had a good follow-up eye appointment last week.  Pt accompanied by: significant other  PERTINENT HISTORY: Pt. has Hx of stroke with onset 12/18/2023. Pt. PMHx includes: HTN, Hyperlipidemia, Kidney Stones, near syncopal event, loss of vision.  PRECAUTIONS: None  WEIGHT BEARING RESTRICTIONS: None  PAIN:  Are you having pain? No  FALLS: Has patient fallen in last 6 months? Yes. Number of falls    LIVING ENVIRONMENT: Lives with: lives with their family Lives in: [place], [place] Stairs: No, inside the house, yes but does not use Has following equipment at home:   PLOF: Independent  PATIENT GOALS: To be able to see  OBJECTIVE:   HAND DOMINANCE: Right  ADLs:  Eating: Drinking from straw is different, able to use utensils.  Grooming: Fatigues completing hair care.  UB Dressing: independent, if clothes are in front of her she can find clothing LB Dressing: Independent Toileting: Independent Bathing: Independent Tub Shower transfers: Independent Equipment: none Has difficulty with using cell phone  IADLs: Shopping: Does not typically go out shopping, and has not tried to. Light housekeeping: Pt. reports that she does not typically do house cleaning. Pt. has been able to unpack belongings from boxes due to her recent move in. Meal Prep: Pt. reports that is does not currently cook, however reports that she probably could. Has difficulty opening bottle caps/lids. Community mobility: Requires assistance from husband to navigate  through buildings, negotiate stairs-Pt. With increased fear of falling  Medication management: Able to push down medication bottles to open  Financial management: No change in the process-uses automatic bill pay system.  Has difficulty  with using cell phone Handwriting: N/T Work: Pt. was actively working managing a health visitor, works a lot of hours  MOBILITY STATUS: Independent and Needs Assist: Requires hand on hand assistance with nvigating through environments.   POSTURE COMMENTS:  No Significant postural limitations Sitting balance: Good  ACTIVITY TOLERANCE: Activity tolerance: Good  FUNCTIONAL OUTCOME MEASURES:   UPPER EXTREMITY ROM:    Active ROM Right Eval WFL Left Eval [facility]  Shoulder flexion    Shoulder abduction    Shoulder adduction    Shoulder extension    Shoulder internal rotation    Shoulder external rotation    Elbow flexion    Elbow extension    Wrist flexion    Wrist extension    Wrist ulnar deviation    Wrist radial deviation    Wrist pronation    Wrist supination    (Blank rows = not tested)  UPPER EXTREMITY MMT:     MMT Right eval Right 01/29/24 Right  04/04/24 Left eval Left 01/29/24 Left 04/04/24  Shoulder flexion 4-/5 5/5 5/5 4-/5 5/5 5/5  Shoulder abduction 4-/5 5/5 5/5 4-/5 5/5 5/5  Shoulder adduction        Shoulder extension        Shoulder internal rotation  Shoulder external rotation        Middle trapezius        Lower trapezius        Elbow flexion 5/5 5/5 5/5 5/5 5/5 5/5  Elbow extension 5/5 5/5 5/5 5/5 5/5 5/5  Wrist flexion 4/5 5/5 5/5 4-/5 5/5 5/5  Wrist extension 4/5 5/5 5/5 4-/5 5/5 5/5  Wrist ulnar deviation        Wrist radial deviation        Wrist pronation        Wrist supination        (Blank rows = not tested)  HAND FUNCTION: Grip strength: Right: 39 lbs; Left: 28 lbs, Lateral pinch: Right: 11 lbs, Left: 9 lbs, and 3 point pinch: Right: 9 lbs, Left: 9 lbs  01/29/24 Grip strength: Right: 39 lbs; Left: 36 lbs, Lateral pinch: Right: 14 lbs, Left: 12 lbs, and 3 point pinch: Right: 12 lbs, Left: 11 lbs  04/04/24:  Grip strength: Right: 37 lbs; Left: 42 lbs    COORDINATION: 9 Hole Peg test: Right: 30 sec; Left: 36  sec  01/29/24 9 Hole Peg test: Right: 24 sec; Left: 23 sec.  04/04/24:  9 Hole Peg test: Right: 28 sec; Left: 33 sec.      SENSATION: WFL  EDEMA:   MUSCLE TONE:   COGNITION: Overall cognitive status: Within functional limits for tasks assessed  VISION: Subjective report: Pt. reports changes in her vision at onset of stroke, starting with blurry vision, resulting in vision loss in the R eye.  Baseline vision: Pt. Is able to visually track  Visual history: Hx of eye surgery  VISION ASSESSMENT: TBD  PERCEPTION: WFL  PRAXIS: WFL  OBSERVATIONS:                                                                                                                    TREATMENT DATE: 04/23/24   Therapeutic activities:  -Facilitated visual memory, and sequencing skills working to locate  4, 7, and 9 step visual sequencing task. -Facilitated visual scanning for each step of the IADL sequence card placed randomly throughout the room. -Facilitated Lateral, and 3pt. pinch strengthening using yellow, red, green, and blue level resistive clips.  -Facilitated translatory movements moving clips from the lateral pinch position to the 3pt. pinch position in preparation for securely placing them on the dowel to promote hand function skills.  HOME EXERCISE PROGRAM:  Ida Theraputty exercises for  hand strengthening at bridge -Visual perceptual: Flip flop target design patterns. -Visual memory tasks -Writing tasks -visual motor tasks.  GOALS: Goals reviewed with patient? Yes  SHORT TERM GOALS: Target date: 04/17/2024    Pt. Will independently utilize HEP for hand strength, coordination, and visual compensatory strategies ADL/ADLs Baseline: 04/12/23: Independent, continue 03/06/24: Independent Eval: No  current HEP  Goal status: Ongoing   LONG TERM GOALS: Target date: 05/29/2024   Pt. Will be able to independently implement visual scanning/visual search compensatory strategies for  tasks within her extra personal  space navigating through community environments 100% of the time.  Baseline: 01/29/24:  Independent  100% of the time within the therapy gym, and hallway. Eval: Pt. Requires increased assist to navigate through community environments. Goal status: Achieved  2.  Pt. Will be able to independently utilize visual scanning/visual search strategies  during ADLs/IADLs within her near space, and during tabletop tasks, and tasks at a vertical plane with 100% accuracy. Baseline: 04/11/24: 80% simple to moderately complex visual scanning tasks however 70% accuracy for complex visual scanning tasks, and scanning at a vertical plane. 03/06/24: Initiates visual scanning 85% of the time  01/29/24: 75% Eval: Pt. Education to be provided.  Goal status: revised-04/11/24  3.  Pt. Will be able to independently initiate visual scanning techniques in her own environment to reduce risk of falls.  Baseline: 01/29/24: Education was provided, Pt. is utlizing visual compensatory strategies/visual scanning within her home. Eval: Pt. Education to be provided.  Goal status: Achieved  4.  Pt. Will increase BUE Grip Strength by 5# to be able to independently and securely hold objects for ADL/IADL use. Baseline:  04/11/24: Continue- recent revision 04/04/2024: Grip strength: Right: 37 lbs; Left: 42 lbs patient is dropping items from her hands 01/29/24: Grip strength: Right: 39 lbs; Left: 28 lbs, Eval: Right Grip: 39#, Left Grip: 28# Goal status: Ongoing-recent revision  5.  Pt. Will increase L Lateral Pinch strength by 2# to be able to open bottles. Baseline: 04/04/2024: Grip strength: Right: 37 lbs; Left: 42 lbs 01/29/24: Lateral pinch: Right: 14 lbs, Left: 12 lbs Eval: Lateral pinch: Right: 11 lbs, Left: 9 lbs  Goal status: Achieved  6.  Pt. Will increase BUE strength for shoulder flexion/abduction by 2 mm grades to independently complete hair care tasks. Baseline: 03/16/2024: 5/5 01/29/24: 5/5 overall  Eval: Right Shoulder Flexion: 4-/5, Left Shoulder Flexion:4-/5, Right Shoulder Abduction: 4-/5, Left Shoulder Abduction: 4-/5 Goal status: Achieved  7.  Pt will be able to independently and efficiently manipulate medication without dropping them.  Baseline: 04/11/24: Continue, recent revision 04/04/2024: 9 Hole Peg test: Right: 28 sec; Left: 33 sec. 01/29/24: Independent 01/15/24: Daughter currently manages medication set up.  Goal status: Ongoing, recent revision  8. Pt will increase typing speed to 20-30 words per minute with at least 90% accuracy to work towards more efficient typing for job related  responsibilities.  Baseline: 04/11/24: TBD 03/06/24: 10 wpm with 92% accuracy. 01/29/24: Continue 01/15/24: 8wpm x88% accuracy=7 wpm  Goal status: Ongoing  9. Pt will be able to scan small print on store receipts to check for errors with 100% accuracy using visual compensation strategies as needed.  Baseline:04/11/24: Pt. Continues to present with difficulty with visual scanning small  small items efficiently. 03/06/24: Pt. Continues to present with difficulty with visual scanning small  small items efficiently. 01/29/24: Pt. Continues to have difficulty scanning small print items. 01/15/24: Not yet attempted; required for return to work/job responsibility  Goal status:  Ongoing  10. Pt. will independently identify 8 items consistently from visual visual memory in preparation or ADLs.    Baseline: 04/11/24: 5-7 items  while seated at the tabletop. 03/06/24: Pt. is able to identify up to 6 picture items while seated at the tabletop. Pt. is able to consistently Identify 4 letters from visual memory when attention is divided.  01/29/24: Pt. is able to identify 6 items consistently from visual memory.    Gaol status: Ongoing      11. Pt. will be independently write 4 sentences  efficiently with 100% legibility, no deviation from writing on a blank line, and appropriate spacing between letters.               Baseline: 04/11/24: Continue 03/06/24: 4 lines with 75% legibility, positive deviation below the line, and excessive spacing between the words.              Goal status: Ongoing  ASSESSMENT:  CLINICAL IMPRESSION:  Pt. was able to independently, and efficiently visually scan the room to locate, and sequence each step of the 4, and 7 step picture sequences. Pt. presented with increased difficulty completing the 9 step sequential task, and presented with more difficulty with attention to the more complex fine details of the sequence pictures. Pt. continues to benefit from Occupational Therapy services to improve her ability to use visual compensatory strategies, and improve overall BUE functioning in order to improve engagement in, and maximize overall independence with ADL, and IADL tasks.    PERFORMANCE DEFICITS: in functional skills including ADLs, IADLs, coordination, dexterity, Fine motor control, and vision, and psychosocial skills including coping strategies, environmental adaptation, habits, interpersonal interactions, and routines and behaviors.   IMPAIRMENTS: are limiting patient from ADLs, IADLs, rest and sleep, work, leisure, and social participation.   CO-MORBIDITIES: may have co-morbidities  that affects occupational performance. Patient will benefit from skilled OT to address above impairments and improve overall function.  MODIFICATION OR ASSISTANCE TO COMPLETE EVALUATION: Min-Moderate modification of tasks or assist with assess necessary to complete an evaluation.  OT OCCUPATIONAL PROFILE AND HISTORY: Detailed assessment: Review of records and additional review of physical, cognitive, psychosocial history related to current functional performance.  CLINICAL DECISION MAKING: Moderate - several treatment options, min-mod task modification necessary  REHAB POTENTIAL: Good  EVALUATION COMPLEXITY: Moderate    PLAN:  OT FREQUENCY: 2x/week  OT DURATION: 12 weeks  PLANNED  INTERVENTIONS: 97168 OT Re-evaluation, 97535 self care/ADL training, 02889 therapeutic exercise, 97530 therapeutic activity, 97112 neuromuscular re-education, visual/perceptual remediation/compensation, patient/family education, and DME and/or AE instructions  RECOMMENDED OTHER SERVICES: ST  CONSULTED AND AGREED WITH PLAN OF CARE: Patient and family member/caregiver  PLAN FOR NEXT SESSION: Treatment  Richardson Otter, MS, OTR/L  04/23/24

## 2024-04-24 ENCOUNTER — Ambulatory Visit

## 2024-04-24 ENCOUNTER — Ambulatory Visit: Admitting: Occupational Therapy

## 2024-04-24 ENCOUNTER — Ambulatory Visit: Admitting: Physical Therapy

## 2024-04-24 DIAGNOSIS — R41841 Cognitive communication deficit: Secondary | ICD-10-CM | POA: Diagnosis not present

## 2024-04-24 DIAGNOSIS — M5459 Other low back pain: Secondary | ICD-10-CM

## 2024-04-24 DIAGNOSIS — M546 Pain in thoracic spine: Secondary | ICD-10-CM

## 2024-04-24 DIAGNOSIS — M6281 Muscle weakness (generalized): Secondary | ICD-10-CM

## 2024-04-24 NOTE — Therapy (Unsigned)
 OUTPATIENT PHYSICAL THERAPY THORACOLUMBAR EVALUATION   Patient Name: Ashley Collins MRN: [id number] DOB:02/26/1961, 63 y.o., female Today's Date: 04/25/2024  END OF SESSION:  PT End of Session - 04/25/24 0706     Visit Number 1    Number of Visits 16    Date for Recertification  06/20/24    Authorization Type [employer] HMO/PPO    Progress Note Due on Visit 10    PT Start Time 1407    PT Stop Time 1445    PT Time Calculation (min) 38 min    Activity Tolerance Patient tolerated treatment well;Patient limited by pain    Behavior During Therapy WFL for tasks assessed/performed          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   Cardiac arrhythmia    Nonspecific ST T wave changes on EKG   Chronic venous insufficiency of lower extremity    Complication of anesthesia    nausea and vomiting   DDD (degenerative disc disease), lumbosacral    Essential hypertension    Headache    History of kidney stones    Hyperlipidemia    Kidney stones    Lymphedema    Migraines    Osteoporosis    PONV (postoperative nausea and vomiting)    Right ureteral stone    Vitamin B12 deficiency    Vitamin D deficiency    Past Surgical History:  Procedure Laterality Date   AUGMENTATION MAMMAPLASTY     CESAREAN SECTION     x 4   COLONOSCOPY WITH PROPOFOL  N/A 03/31/2022   Procedure: COLONOSCOPY WITH PROPOFOL ;  Surgeon: Onita Elspeth Sharper, DO;  Location: [facility] ENDOSCOPY;  Service: Gastroenterology;  Laterality: N/A;   CYSTOSCOPY W/ RETROGRADES Bilateral 07/10/2020   Procedure: CYSTOSCOPY WITH RETROGRADE PYELOGRAM;  Surgeon: Francisca Redell BROCKS, MD;  Location: [facility] ORS;  Service: Urology;  Laterality: Bilateral;   CYSTOSCOPY/URETEROSCOPY/HOLMIUM LASER/STENT PLACEMENT     CYSTOSCOPY/URETEROSCOPY/HOLMIUM LASER/STENT PLACEMENT Bilateral 07/10/2020   Procedure: CYSTOSCOPY/URETEROSCOPY/HOLMIUM LASER/STENT PLACEMENT;  Surgeon:  Francisca Redell BROCKS, MD;  Location: [facility] ORS;  Service: Urology;  Laterality: Bilateral;   CYSTOSCOPY/URETEROSCOPY/HOLMIUM LASER/STENT PLACEMENT Right 08/25/2023   Procedure: CYSTOSCOPY/URETEROSCOPY/HOLMIUM LASER;  Surgeon: Francisca Redell BROCKS, MD;  Location: [facility] ORS;  Service: Urology;  Laterality: Right;   CYSTOSCOPY/URETEROSCOPY/HOLMIUM LASER/STENT PLACEMENT Right 12/15/2023   Procedure: CYSTOSCOPY/URETEROSCOPY/HOLMIUM LASER;  Surgeon: Francisca Redell BROCKS, MD;  Location: [facility] ORS;  Service: Urology;  Laterality: Right;   EXTRACORPOREAL SHOCK WAVE LITHOTRIPSY     x 10 plus   Eye Lift     FACIAL COSMETIC SURGERY     GANGLION CYST EXCISION Right 12/21/2021   Procedure: REMOVAL GANGLION OF WRIST;  Surgeon: Kathlynn Sharper, MD;  Location: [facility] ORS;  Service: Orthopedics;  Laterality: Right;   LIPOSUCTION     TONSILLECTOMY     TRIGGER FINGER RELEASE Right 12/21/2021   Procedure: RELEASE TRIGGER FINGER/A-1 PULLEY;  Surgeon: Kathlynn Sharper, MD;  Location: [facility] ORS;  Service: Orthopedics;  Laterality: Right;   URETEROSCOPY WITH HOLMIUM LASER LITHOTRIPSY     Patient Active Problem List   Diagnosis Date Noted   Acute CVA (cerebrovascular accident) (HCC) 12/19/2023   Hypokalemia 12/19/2023   AKI (acute kidney injury) 12/19/2023   Leukocytosis 12/19/2023   Hyperlipidemia, unspecified 12/19/2023   Essential hypertension 12/19/2023   Vision changes 12/19/2023   Lymphedema 05/22/2023   Chronic venous insufficiency 05/22/2023   Menopausal syndrome on  hormone replacement therapy 04/10/2023   Insomnia due to medical condition 04/25/2022   GAD (generalized anxiety disorder) 02/02/2022   Other specified depressive episodes 02/02/2022   Long-term current use of benzodiazepine 02/02/2022   Migraine with aura and without status migrainosus, not intractable 11/03/2020   Arrhythmia 08/06/2020   Status migrainosus 02/25/2019   Osteoporosis 04/09/2013   Vitamin D deficiency 04/09/2013   DDD (degenerative disc  disease), lumbosacral 02/08/2005    PCP: Harvey Gaetana CROME, NP   REFERRING PROVIDER: Meeler, Benton CROME, FNP   REFERRING DIAG: M54.16 (ICD-10-CM) - Lumbar radiculitis   Rationale for Evaluation and Treatment: Rehabilitation  THERAPY DIAG:  Muscle weakness (generalized)  Other low back pain  Pain in thoracic spine  ONSET DATE: 1.5 months   SUBJECTIVE:                                                                                                                                                                                           SUBJECTIVE STATEMENT: Pt here to work on pain and discomfort in her mid back. Pt reports the more she is on her feet the more her back hurts and relief comes from being supine. Pt has not had any previous therapy for her back pain. Pt has epidural that assists with her pain below her belt line. Pt still in OT working on her upper extremity and grip strength. Pt has epidural shots for last 3 years. Pt initially hurt her back lifting something to put in her car. Patient can stand for maybe 30 min at a time before pain is no longer tolerable. She reports that sitting down does not improve her pain but lying on her side in would bring relief. Pt has history of osteoporosis and sliped disc in her back. Pt pain in this area is similar to pain she gets epidural for but just further superior into her thoracic spine.    PERTINENT HISTORY:  Patient familiar to this clinic before.  Was previously seen following CVA for mobility and balance deficits.  Still seeing speech and language pathologist and occupational therapy.  PAIN:  Are you having pain? Yes: NPRS scale: 3-10 ( at end of day everyday) Pain location: above belt line and radiating laterally  Pain description: nagging, throbbing pain  Aggravating factors: Standing Relieving factors: Lying down  PRECAUTIONS: None  RED FLAGS: None   WEIGHT BEARING RESTRICTIONS: No  FALLS:  Has patient fallen in  last 6 months? No  LIVING ENVIRONMENT: Lives with: lives with SO Lives in: House/apartment Stairs: No Has following equipment at home: None  OCCUPATION: Merchandiser, Retail   PLOF: Independent and  Independent with basic ADLs  PATIENT GOALS: improve back pain   NEXT MD VISIT: Not scheduled   OBJECTIVE:  Note: Objective measures were completed at Evaluation unless otherwise noted.  DIAGNOSTIC FINDINGS:  No recent images since to 2023 were mild degenerative changes in the lumbar spine were noted  PATIENT SURVEYS:  Modified Oswestry:  MODIFIED OSWESTRY DISABILITY SCALE  Date: 04/25/24 Score  Pain intensity 5 =  Pain medication has no effect on my pain.  2. Personal care (washing, dressing, etc.) 0 =  I can take care of myself normally without causing increased pain.  3. Lifting 5 =  I cannot lift or carry anything at all.  4. Walking 3 =  Pain prevents me from walking more than  mile.  5. Sitting 3 =  Pain prevents me from sitting more than  hour.  6. Standing 2 =  Pain prevents me from standing more than 1 hour  7. Sleeping 1 = I can sleep well only by using pain medication.  8. Social Life 2 = Pain prevents me from participating in more energetic activities (eg. sports, dancing).  9. Traveling 2 =  My pain restricts my travel over 2 hours.  10. Employment/ Homemaking 3 = Pain prevents me from doing anything but light duties.  Total 26/50   Interpretation of scores: Score Category Description  0-20% Minimal Disability The patient can cope with most living activities. Usually no treatment is indicated apart from advice on lifting, sitting and exercise  21-40% Moderate Disability The patient experiences more pain and difficulty with sitting, lifting and standing. Travel and social life are more difficult and they may be disabled from work. Personal care, sexual activity and sleeping are not grossly affected, and the patient can usually be managed by conservative means  41-60%  Severe Disability Pain remains the main problem in this group, but activities of daily living are affected. These patients require a detailed investigation  61-80% Crippled Back pain impinges on all aspects of the patient's life. Positive intervention is required  81-100% Bed-bound These patients are either bed-bound or exaggerating their symptoms  Bluford FORBES Zoe DELENA Karon DELENA, et al. Surgery versus conservative management of stable thoracolumbar fracture: the PRESTO feasibility RCT. [place] ([place]): [employer]; 2021 Nov. [facility] Technology Assessment, No. 25.62.) Appendix 3, Oswestry Disability Index category descriptors. Available from: Findjewelers.cz  Minimally Clinically Important Difference (MCID) = 12.8%  COGNITION: Overall cognitive status: Within functional limits for tasks assessed     SENSATION: WFL  MUSCLE LENGTH: Hamstrings: WFL during DLLT   POSTURE: rounded shoulders  PALPATION: Tender to palpation along lumbar paraspinals, origin of gluteal muscles, and some along thoracic paraspinals.  Patient had most pain along lumbar paraspinals and gluteal musculature on the left greater than right side  LUMBAR ROM:   AROM eval  Flexion WNL  Extension Min - tight   Right lateral flexion To knee- tight  Left lateral flexion To knee - tight   Right rotation WNL- tight   Left rotation WNL tight    (Blank rows = not tested)  LOWER EXTREMITY ROM:    Further hip range of motion may be beneficial  LOWER EXTREMITY MMT:    MMT Right eval Left eval  Hip flexion 4 4  Hip extension    Hip abduction 4+ 4+  Hip adduction 5 5  Hip internal rotation    Hip external rotation    Knee flexion 5 5  Knee extension 5 5  Ankle dorsiflexion  5 5  Ankle plantarflexion    Ankle inversion    Ankle eversion     (Blank rows = not tested) Measured in seated position   LUMBAR SPECIAL TESTS:  DLLT: could not sustain past 60 degrees  FUNCTIONAL  TESTS:     TREATMENT DATE: 04/25/24   SELF CARE Patient instructed in plan of care, findings for evaluation, and ways of physical therapy may improve their function and quality of life.     PATIENT EDUCATION: Education details: POC Person educated: Patient Education method: Explanation Education comprehension: verbalized understanding   HOME EXERCISE PROGRAM: Establish visit 2    ASSESSMENT:  CLINICAL IMPRESSION:  Patient is a 63 year old female who presents to physical therapy with complaints of back pain.  Patient has a 3-year history of getting epidural injections to manage her back pain and it has previously been successful but patient reports that the pain is now superior to the previous levels and the level of that the epidural was completed as no longer effective.  Patient noted to have significant decrease in core strength as noted with double leg lowering test.  Patient also having significant difficulty with standing for prolonged periods of time as well as being on her feet both of which she will need to be more proficient at as she progresses to returning to work.  Patient will benefit from skilled physical therapy to address the above impairments and improve her quality of life and ability to complete functional activities without significant pain.  OBJECTIVE IMPAIRMENTS: Abnormal gait.   ACTIVITY LIMITATIONS: carrying, standing, squatting, and locomotion level  PARTICIPATION LIMITATIONS: meal prep, cleaning, shopping, community activity, and occupation  PERSONAL FACTORS: Past/current experiences, Time since onset of injury/illness/exacerbation, and 3+ comorbidities: HTN, HLD, osteoporosis, B12 deficiency are also affecting patient's functional outcome.   REHAB POTENTIAL: Good  CLINICAL DECISION MAKING: Stable/uncomplicated  EVALUATION COMPLEXITY: Low   GOALS: Goals reviewed with patient? Yes  SHORT TERM GOALS: Target date: 05/23/2024       Patient  will be independent in home exercise program to improve strength/mobility for better functional independence with ADLs. Baseline: No HEP currently  Goal status: INITIAL   LONG TERM GOALS: Target date: 06/19/2024    Patient will improve modified Oswestry pain disability questionnaire by 10 points or greater in order to indicate improvement in her subjective level of back pain as well as her ability to complete functional tasks without pain limitations Baseline: 26 Goal status: INITIAL  2.  Patient will improve double leg limb lowering test from 60 degrees to 30 degrees or greater showing good core control throughout in order to indicate improved strength of anterior core muscles for improved lumbar stability Baseline: 60 degrees patient loses control and has some discomfort Goal status: INITIAL  3.  Patient will report ability to stand for greater than 1 hour without limitations of pain over to allow her to return to work-related tasks as well as shopping related tasks with less limitation Baseline: When patient stands for 30 minutes per her report she starts to get intense pain that is only relieved by lying down Goal status: INITIAL  4.  Patient report ability to lift light objects from the floor and set them on an object such as a table in order without increase in back pain to show improved functional capacity as well as improved ability to return to work-related tasks Baseline: Patient reports she cannot lift or carry anything at all  Goal status: INITIAL    PLAN:  PT FREQUENCY: 2x/week  PT DURATION: 8 weeks  PLANNED INTERVENTIONS: 97750- Physical Performance Testing, 97110-Therapeutic exercises, 97530- Therapeutic activity, [id number]- Neuromuscular re-education, 97535- Self Care, 02859- Manual therapy, [id number]- Gait training, [id number] (1-2 muscles), 20561 (3+ muscles)- Dry Needling, Patient/Family education, Balance training, Stair training, and Moist heat.  PLAN FOR NEXT SESSION: hip  assessment, STM or IASTM to paraspinals, QL and/ or gluteal origin   Lonni KATHEE Gainer, PT 04/25/2024, 7:08 AM

## 2024-04-24 NOTE — Therapy (Signed)
 OUTPATIENT SPEECH LANGUAGE PATHOLOGY  TREATMENT   Patient Name: Ashley Collins MRN: [id number] DOB:15-Jul-1960, 63 y.o., female Today's Date: 04/24/2024  PCP: Gaetana Haddock, NP  REFERRING PROVIDER: same   End of Session - 04/24/24 1421     Visit Number 18    Number of Visits 24    Date for Recertification  05/14/24    SLP Start Time 1445    SLP Stop Time  1525    SLP Time Calculation (min) 40 min    Activity Tolerance Patient tolerated treatment well           Patient Active Problem List   Diagnosis Date Noted   Acute CVA (cerebrovascular accident) (HCC) 12/19/2023   Hypokalemia 12/19/2023   AKI (acute kidney injury) 12/19/2023   Leukocytosis 12/19/2023   Hyperlipidemia, unspecified 12/19/2023   Essential hypertension 12/19/2023   Vision changes 12/19/2023   Lymphedema 05/22/2023   Chronic venous insufficiency 05/22/2023   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 replacement therapy 04/10/2023   Insomnia due to medical condition 04/25/2022   GAD (generalized anxiety disorder) 02/02/2022   Other specified depressive episodes 02/02/2022   Long-term current use of benzodiazepine 02/02/2022   Migraine with aura and without status migrainosus, not intractable 11/03/2020   Arrhythmia 08/06/2020   Status migrainosus 02/25/2019   Osteoporosis 04/09/2013   Vitamin D deficiency 04/09/2013   DDD (degenerative disc disease), lumbosacral 02/08/2005    ONSET DATE: 12/19/23   REFERRING DIAG: CVA- memory deficits  THERAPY DIAG:  Cognitive communication deficit  Rationale for Evaluation and Treatment Rehabilitation  SUBJECTIVE:   SUBJECTIVE STATEMENT: Pt alert, pleasant, and cooperative. Pt accompanied by: self and significant other  PERTINENT HISTORY & DIAGNOSTIC FINDINGS: Pt is 63 y.o. female who presents today for a cognitive-communication evaluation in setting of stroke. MRI 12/18/23 1. 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 No associated  hemorrhage or mass effect. PMHx as outlined above.  PAIN:  Are you having pain? No   FALLS: Has patient fallen in last 6 months?  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 Pt recalled >5 details of video from previous session with extra time and self cueing. Discussed memory strategies of personal connection/saliency and repetition. Introduced settings on iPhone/iPad to improve attention/scanning (e.g. reader view, font, eliminating distractors). Pt returned demo with min cues, pt to trial at home.   PATIENT EDUCATION: Education details: as above Person educated: Patient and Spouse Education method: Explanation Education comprehension: verbalized understanding  HOME EXERCISE PROGRAM:        Continue compensations for attention and memory      GOALS:  Goals reviewed with patient? Yes  SHORT TERM GOALS: Target date: 10 sessions  Pt will complete PROM re: memory.  Baseline: Goal status: MET   2.  Pt will endorse successful implementation of at least x2 compensations for attention and memory.  Baseline:  Goal status: MET  3.  With Moderate A, patient will establish external aid for memory/executive function and bring to more than 50% of therapy sessions.    Baseline:  Goal status: MET    LONG TERM GOALS: Target date: 12 weeks  Pt will endorse improvement in cognitive-communication per PROM.  Baseline:  Goal status: PROGRESSING  2.  Pt and/or husband will demonstrate understanding of ways to promote and support cognitive-communication outside of SLP sessions.  Baseline:  Goal status: PROGRESSING   ASSESSMENT:  CLINICAL IMPRESSION:  Pt is 63  y.o. female who presents today for a cognitive-communication treatment in setting of stroke. Initial assessment completed via formal means (Cognitive-Linguistic Quick Test) and PROM (Neuro-QoL Adult Cognitive Function v2.0). Pt presents with at least mild cognitive-communication deficits affecting attention and visuospatial skills per CLQT as well as memory and executive functioning per PROM. Suspect CLQT is not as sensitive to higher level cognitive-communication deficits appreciated by pt during iADLs. See details of today's tx as outlined above. Recommend skilled ST services targeting above mentioned deficits to improve QoL and performance on ADLs/IADLs.  OBJECTIVE IMPAIRMENTS include attention, memory, executive functioning, and visuospatial deficits. These impairments are limiting patient from return to work, managing appointments, household responsibilities, and ADLs/IADLs. Factors affecting potential to achieve goals and functional outcome are medical prognosis.. Patient will benefit from skilled SLP services to address above impairments and improve overall function.  REHAB POTENTIAL: Good  PLAN: SLP FREQUENCY: 2x/week  SLP DURATION: 12 weeks  PLANNED INTERVENTIONS: Cueing hierachy, Cognitive reorganization, Internal/external aids, Functional tasks, SLP instruction and feedback, Compensatory strategies, and Patient/family education    Delon Bangs, M.S., CCC-SLP Speech-Language Pathologist [facility] [phone] [place])  [facility] at [facility] [address] [place], [place], [address] Phone: [phone]   Fax:  [phone]

## 2024-04-25 ENCOUNTER — Ambulatory Visit

## 2024-04-25 ENCOUNTER — Encounter: Admitting: Occupational Therapy

## 2024-04-25 ENCOUNTER — Encounter

## 2024-04-25 ENCOUNTER — Ambulatory Visit: Admitting: Physical Therapy

## 2024-04-25 DIAGNOSIS — L578 Other skin changes due to chronic exposure to nonionizing radiation: Secondary | ICD-10-CM | POA: Diagnosis not present

## 2024-04-25 DIAGNOSIS — Z85820 Personal history of malignant melanoma of skin: Secondary | ICD-10-CM

## 2024-04-25 DIAGNOSIS — L738 Other specified follicular disorders: Secondary | ICD-10-CM | POA: Diagnosis not present

## 2024-04-25 DIAGNOSIS — W908XXA Exposure to other nonionizing radiation, initial encounter: Secondary | ICD-10-CM | POA: Diagnosis not present

## 2024-04-25 DIAGNOSIS — L820 Inflamed seborrheic keratosis: Secondary | ICD-10-CM | POA: Diagnosis not present

## 2024-04-25 MED ORDER — TRETINOIN 0.025 % EX CREA: TOPICAL_CREAM | Freq: Every day | CUTANEOUS | 5 refills | Status: AC

## 2024-04-25 NOTE — Patient Instructions (Addendum)
 Start tretinoin  0.025% cream in the evening. Educated patient about proper use and potential side effects, including dryness, irritation, sun sensitivity, and transient worsening of acne. Start with applying three times a week gradually increase to using nightly.   Cryotherapy Aftercare  Wash gently with soap and water  everyday.   Apply Vaseline and Band-Aid daily until healed.    Due to recent changes in healthcare laws, you may see results of your pathology and/or laboratory studies on MyChart before the doctors have had a chance to review them. We understand that in some cases there may be results that are confusing or concerning to you. Please understand that not all results are received at the same time and often the doctors may need to interpret multiple results in order to provide you with the best plan of care or course of treatment. Therefore, we ask that you please give us  2 business days to thoroughly review all your results before contacting the office for clarification. Should we see a critical lab result, you will be contacted sooner.   If You Need Anything After Your Visit  If you have any questions or concerns for your doctor, please call our main line at [phone] and press option 4 to reach your doctor's medical assistant. If no one answers, please leave a voicemail as directed and we will return your call as soon as possible. Messages left after 4 pm will be answered the following business day.   You may also send us  a message via MyChart. We typically respond to MyChart messages within 1-2 business days.  For prescription refills, please ask your pharmacy to contact our office. Our fax number is [phone].  If you have an urgent issue when the clinic is closed that cannot wait until the next business day, you can page your doctor at the number below.    Please note that while we do our best to be available for urgent issues outside of office hours, we are not available  24/7.   If you have an urgent issue and are unable to reach us , you may choose to seek medical care at your doctor's office, retail clinic, urgent care center, or emergency room.  If you have a medical emergency, please immediately call 911 or go to the emergency department.  Pager Numbers  - Dr. Hester: [phone]  - Dr. Jackquline: [phone]  - Dr. Claudene: [phone]   In the event of inclement weather, please call our main line at [phone] for an update on the status of any delays or closures.  Dermatology Medication Tips: Please keep the boxes that topical medications come in in order to help keep track of the instructions about where and how to use these. Pharmacies typically print the medication instructions only on the boxes and not directly on the medication tubes.   If your medication is too expensive, please contact our office at [phone] option 4 or send us  a message through MyChart.   We are unable to tell what your co-pay for medications will be in advance as this is different depending on your insurance coverage. However, we may be able to find a substitute medication at lower cost or fill out paperwork to get insurance to cover a needed medication.   If a prior authorization is required to get your medication covered by your insurance company, please allow us  1-2 business days to complete this process.  Drug prices often vary depending on where the prescription is filled and some pharmacies may offer  cheaper prices.  The website www.goodrx.com contains coupons for medications through different pharmacies. The prices here do not account for what the cost may be with help from insurance (it may be cheaper with your insurance), but the website can give you the price if you did not use any insurance.  - You can print the associated coupon and take it with your prescription to the pharmacy.  - You may also stop by our office during regular business hours and pick  up a GoodRx coupon card.  - If you need your prescription sent electronically to a different pharmacy, notify our office through [facility] or by phone at [phone] option 4.     Si Usted Necesita Algo Despus de Su Visita  Tambin puede enviarnos un mensaje a travs de Clinical Cytogeneticist. Por lo general respondemos a los mensajes de MyChart en el transcurso de 1 a 2 das hbiles.  Para renovar recetas, por favor pida a su farmacia que se ponga en contacto con nuestra oficina. Randi lakes de fax es [place] [phone].  Si tiene un asunto urgente cuando la clnica est cerrada y que no puede esperar hasta el siguiente da hbil, puede llamar/localizar a su doctor(a) al nmero que aparece a continuacin.   Por favor, tenga en cuenta que aunque hacemos todo lo posible para estar disponibles para asuntos urgentes fuera del horario de [place], no estamos disponibles las 24 horas del da, los 7 [address]  [phone] de la [place].   Si tiene un problema urgente y no puede comunicarse con nosotros, puede optar por buscar atencin mdica  en el consultorio de su doctor(a), en una clnica privada, en un centro de atencin urgente o en una sala de emergencias.  Si tiene engineer, drilling, por favor llame inmediatamente al 911 o vaya a la sala de emergencias.  Nmeros de bper  - Dr. Hester: [phone]  - Dra. Jackquline: [phone]  - Dr. Claudene: [phone]   En caso de inclemencias del tiempo, por favor llame a landry capes principal al [phone] para una actualizacin sobre el [place] de cualquier retraso o cierre.  Consejos para la medicacin en dermatologa: Por favor, guarde las cajas en las que vienen los medicamentos de uso tpico para ayudarle a seguir las instrucciones sobre dnde y cmo usarlos. Las farmacias generalmente imprimen las instrucciones del medicamento slo en las cajas y no directamente en los tubos del [place].   Si su medicamento es muy caro, por favor, pngase en  contacto con landry rieger llamando al [phone] y presione la opcin 4 o envenos un mensaje a travs de Clinical Cytogeneticist.   No podemos decirle cul ser su copago por los medicamentos por adelantado ya que esto es diferente dependiendo de la cobertura de su seguro. Sin embargo, es posible que podamos encontrar un medicamento sustituto a audiological scientist un formulario para que el seguro cubra el medicamento que se considera necesario.   Si se requiere una autorizacin previa para que su compaa de seguros cubra su medicamento, por favor permtanos de 1 a 2 das hbiles para completar este proceso.  Los precios de los medicamentos varan con frecuencia dependiendo del environmental consultant de dnde se surte la receta y alguna farmacias pueden ofrecer precios ms baratos.  El sitio web www.goodrx.com tiene cupones para medicamentos de health and safety inspector. Los precios aqu no tienen en cuenta lo que podra costar con la ayuda del seguro (puede ser ms barato con su seguro), pero el sitio web puede darle el precio si no dance movement psychotherapist  ningn seguro.  - Puede imprimir el cupn correspondiente y llevarlo con su receta a la farmacia.  - Tambin puede pasar por nuestra oficina durante el horario de atencin regular y education officer, museum una tarjeta de cupones de GoodRx.  - Si necesita que su receta se enve electrnicamente a una farmacia diferente, informe a nuestra oficina a travs de MyChart de [place] o por telfono llamando al [phone] y presione la opcin 4.

## 2024-04-25 NOTE — Therapy (Signed)
 Occupational Therapy Neuro Treatment Note   Patient Name: Ashley Collins MRN: [id number] DOB:1960-10-10, 63 y.o., female Today's Date: 04/25/2024  PCP: Harvey Gaetana CROME, NP REFERRING PROVIDER: Harvey Gaetana CROME, NP   OT End of Session - 04/25/24 0847     Visit Number 35    Number of Visits 48    Date for Recertification  05/29/24    OT Start Time 1530    OT Stop Time 1615    OT Time Calculation (min) 45 min    Activity Tolerance Patient tolerated treatment well    Behavior During Therapy WFL for tasks assessed/performed              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   Cardiac arrhythmia    Nonspecific ST T wave changes on EKG   Chronic venous insufficiency of lower extremity    Complication of anesthesia    nausea and vomiting   DDD (degenerative disc disease), lumbosacral    Essential hypertension    Headache    History of kidney stones    Hyperlipidemia    Kidney stones    Lymphedema    Migraines    Osteoporosis    PONV (postoperative nausea and vomiting)    Right ureteral stone    Vitamin B12 deficiency    Vitamin D deficiency    Past Surgical History:  Procedure Laterality Date   AUGMENTATION MAMMAPLASTY     CESAREAN SECTION     x 4   COLONOSCOPY WITH PROPOFOL  N/A 03/31/2022   Procedure: COLONOSCOPY WITH PROPOFOL ;  Surgeon: Onita Elspeth Sharper, DO;  Location: [facility] ENDOSCOPY;  Service: Gastroenterology;  Laterality: N/A;   CYSTOSCOPY W/ RETROGRADES Bilateral 07/10/2020   Procedure: CYSTOSCOPY WITH RETROGRADE PYELOGRAM;  Surgeon: Francisca Redell BROCKS, MD;  Location: [facility] ORS;  Service: Urology;  Laterality: Bilateral;   CYSTOSCOPY/URETEROSCOPY/HOLMIUM LASER/STENT PLACEMENT     CYSTOSCOPY/URETEROSCOPY/HOLMIUM LASER/STENT PLACEMENT Bilateral 07/10/2020   Procedure: CYSTOSCOPY/URETEROSCOPY/HOLMIUM LASER/STENT PLACEMENT;  Surgeon: Francisca Redell BROCKS, MD;  Location: [facility] ORS;  Service:  Urology;  Laterality: Bilateral;   CYSTOSCOPY/URETEROSCOPY/HOLMIUM LASER/STENT PLACEMENT Right 08/25/2023   Procedure: CYSTOSCOPY/URETEROSCOPY/HOLMIUM LASER;  Surgeon: Francisca Redell BROCKS, MD;  Location: [facility] ORS;  Service: Urology;  Laterality: Right;   CYSTOSCOPY/URETEROSCOPY/HOLMIUM LASER/STENT PLACEMENT Right 12/15/2023   Procedure: CYSTOSCOPY/URETEROSCOPY/HOLMIUM LASER;  Surgeon: Francisca Redell BROCKS, MD;  Location: [facility] ORS;  Service: Urology;  Laterality: Right;   EXTRACORPOREAL SHOCK WAVE LITHOTRIPSY     x 10 plus   Eye Lift     FACIAL COSMETIC SURGERY     GANGLION CYST EXCISION Right 12/21/2021   Procedure: REMOVAL GANGLION OF WRIST;  Surgeon: Kathlynn Sharper, MD;  Location: [facility] ORS;  Service: Orthopedics;  Laterality: Right;   LIPOSUCTION     TONSILLECTOMY     TRIGGER FINGER RELEASE Right 12/21/2021   Procedure: RELEASE TRIGGER FINGER/A-1 PULLEY;  Surgeon: Kathlynn Sharper, MD;  Location: [facility] ORS;  Service: Orthopedics;  Laterality: Right;   URETEROSCOPY WITH HOLMIUM LASER LITHOTRIPSY     Patient Active Problem List   Diagnosis Date Noted   Acute CVA (cerebrovascular accident) (HCC) 12/19/2023   Hypokalemia 12/19/2023   AKI (acute kidney injury) 12/19/2023   Leukocytosis 12/19/2023   Hyperlipidemia, unspecified 12/19/2023   Essential hypertension 12/19/2023   Vision changes 12/19/2023   Lymphedema 05/22/2023   Chronic venous insufficiency 05/22/2023   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 replacement therapy 04/10/2023   Insomnia  due to medical condition 04/25/2022   GAD (generalized anxiety disorder) 02/02/2022   Other specified depressive episodes 02/02/2022   Long-term current use of benzodiazepine 02/02/2022   Migraine with aura and without status migrainosus, not intractable 11/03/2020   Arrhythmia 08/06/2020   Status migrainosus 02/25/2019   Osteoporosis 04/09/2013   Vitamin D deficiency 04/09/2013   DDD (degenerative disc disease), lumbosacral 02/08/2005   ONSET DATE:  12/18/2023  REFERRING DIAG:   THERAPY DIAG:  Muscle weakness (generalized)  Rationale for Evaluation and Treatment: Rehabilitation  SUBJECTIVE:  SUBJECTIVE STATEMENT:   Pt. reports doing okay today  Pt accompanied by: significant other  PERTINENT HISTORY: Pt. has Hx of stroke with onset 12/18/2023. Pt. PMHx includes: HTN, Hyperlipidemia, Kidney Stones, near syncopal event, loss of vision.  PRECAUTIONS: None  WEIGHT BEARING RESTRICTIONS: None  PAIN:  Are you having pain? No  FALLS: Has patient fallen in last 6 months? Yes. Number of falls    LIVING ENVIRONMENT: Lives with: lives with their family Lives in: [place], [place] Stairs: No, inside the house, yes but does not use Has following equipment at home:   PLOF: Independent  PATIENT GOALS: To be able to see  OBJECTIVE:   HAND DOMINANCE: Right  ADLs:  Eating: Drinking from straw is different, able to use utensils.  Grooming: Fatigues completing hair care.  UB Dressing: independent, if clothes are in front of her she can find clothing LB Dressing: Independent Toileting: Independent Bathing: Independent Tub Shower transfers: Independent Equipment: none Has difficulty with using cell phone  IADLs: Shopping: Does not typically go out shopping, and has not tried to. Light housekeeping: Pt. reports that she does not typically do house cleaning. Pt. has been able to unpack belongings from boxes due to her recent move in. Meal Prep: Pt. reports that is does not currently cook, however reports that she probably could. Has difficulty opening bottle caps/lids. Community mobility: Requires assistance from husband to navigate  through buildings, negotiate stairs-Pt. with increased fear of falling  Medication management: Able to push down medication bottles to open  Financial management: No change in the process-uses automatic bill pay system.  Has difficulty with using cell phone Handwriting: N/T Work: Pt. was actively  working managing a health visitor, works a lot of hours  MOBILITY STATUS: Independent and Needs Assist: Requires hand on hand assistance with nvigating through environments.   POSTURE COMMENTS:  No Significant postural limitations Sitting balance: Good  ACTIVITY TOLERANCE: Activity tolerance: Good  FUNCTIONAL OUTCOME MEASURES:   UPPER EXTREMITY ROM:    Active ROM Right Eval WFL Left Eval [facility]  Shoulder flexion    Shoulder abduction    Shoulder adduction    Shoulder extension    Shoulder internal rotation    Shoulder external rotation    Elbow flexion    Elbow extension    Wrist flexion    Wrist extension    Wrist ulnar deviation    Wrist radial deviation    Wrist pronation    Wrist supination    (Blank rows = not tested)  UPPER EXTREMITY MMT:     MMT Right eval Right 01/29/24 Right  04/04/24 Left eval Left 01/29/24 Left 04/04/24  Shoulder flexion 4-/5 5/5 5/5 4-/5 5/5 5/5  Shoulder abduction 4-/5 5/5 5/5 4-/5 5/5 5/5  Shoulder adduction        Shoulder extension        Shoulder internal rotation        Shoulder external rotation  Middle trapezius        Lower trapezius        Elbow flexion 5/5 5/5 5/5 5/5 5/5 5/5  Elbow extension 5/5 5/5 5/5 5/5 5/5 5/5  Wrist flexion 4/5 5/5 5/5 4-/5 5/5 5/5  Wrist extension 4/5 5/5 5/5 4-/5 5/5 5/5  Wrist ulnar deviation        Wrist radial deviation        Wrist pronation        Wrist supination        (Blank rows = not tested)  HAND FUNCTION: Grip strength: Right: 39 lbs; Left: 28 lbs, Lateral pinch: Right: 11 lbs, Left: 9 lbs, and 3 point pinch: Right: 9 lbs, Left: 9 lbs  01/29/24 Grip strength: Right: 39 lbs; Left: 36 lbs, Lateral pinch: Right: 14 lbs, Left: 12 lbs, and 3 point pinch: Right: 12 lbs, Left: 11 lbs  04/04/24:  Grip strength: Right: 37 lbs; Left: 42 lbs    COORDINATION: 9 Hole Peg test: Right: 30 sec; Left: 36 sec  01/29/24 9 Hole Peg test: Right: 24 sec; Left: 23  sec.  04/04/24:  9 Hole Peg test: Right: 28 sec; Left: 33 sec.  SENSATION: WFL  EDEMA:   MUSCLE TONE:   COGNITION: Overall cognitive status: Within functional limits for tasks assessed  VISION: Subjective report: Pt. reports changes in her vision at onset of stroke, starting with blurry vision, resulting in vision loss in the R eye.  Baseline vision: Pt. Is able to visually track  Visual history: Hx of eye surgery  VISION ASSESSMENT: TBD  PERCEPTION: WFL  PRAXIS: WFL  OBSERVATIONS:                                                                                                                    TREATMENT DATE: 04/24/24   Therapeutic activities:  - Facilitated visual sequencing skills following multistep directions to formulate a sequence of letters in a word phrase.  Patient required cues for spacing accuracy. - Facilitated right hand legibility and spacing formulating lists of words and printed and cursive form. - Facilitated visual memory skills listing the words from that task above.  Patient was able to recall 7 out of 9 of each of the last items when working on 1 word list at a time.  HOME EXERCISE PROGRAM:  Ida Theraputty exercises for  hand strengthening at bridge -Visual perceptual: Flip flop target design patterns. -Visual memory tasks -Writing tasks -visual motor tasks.  GOALS: Goals reviewed with patient? Yes  SHORT TERM GOALS: Target date: 04/17/2024    Pt. Will independently utilize HEP for hand strength, coordination, and visual compensatory strategies ADL/ADLs Baseline: 04/12/23: Independent, continue 03/06/24: Independent Eval: No  current HEP  Goal status: Ongoing   LONG TERM GOALS: Target date: 05/29/2024   Pt. Will be able to independently implement visual scanning/visual search compensatory strategies for tasks within her extra personal space navigating through community environments 100% of the time.  Baseline: 01/29/24:  Independent  100% of the time within the therapy gym, and hallway. Eval: Pt. Requires increased assist to navigate through community environments. Goal status: Achieved  2.  Pt. Will be able to independently utilize visual scanning/visual search strategies  during ADLs/IADLs within her near space, and during tabletop tasks, and tasks at a vertical plane with 100% accuracy. Baseline: 04/11/24: 80% simple to moderately complex visual scanning tasks however 70% accuracy for complex visual scanning tasks, and scanning at a vertical plane. 03/06/24: Initiates visual scanning 85% of the time  01/29/24: 75% Eval: Pt. Education to be provided.  Goal status: revised-04/11/24  3.  Pt. Will be able to independently initiate visual scanning techniques in her own environment to reduce risk of falls.  Baseline: 01/29/24: Education was provided, Pt. is utlizing visual compensatory strategies/visual scanning within her home. Eval: Pt. Education to be provided.  Goal status: Achieved  4.  Pt. Will increase BUE Grip Strength by 5# to be able to independently and securely hold objects for ADL/IADL use. Baseline:  04/11/24: Continue- recent revision 04/04/2024: Grip strength: Right: 37 lbs; Left: 42 lbs patient is dropping items from her hands 01/29/24: Grip strength: Right: 39 lbs; Left: 28 lbs, Eval: Right Grip: 39#, Left Grip: 28# Goal status: Ongoing-recent revision  5.  Pt. Will increase L Lateral Pinch strength by 2# to be able to open bottles. Baseline: 04/04/2024: Grip strength: Right: 37 lbs; Left: 42 lbs 01/29/24: Lateral pinch: Right: 14 lbs, Left: 12 lbs Eval: Lateral pinch: Right: 11 lbs, Left: 9 lbs  Goal status: Achieved  6.  Pt. Will increase BUE strength for shoulder flexion/abduction by 2 mm grades to independently complete hair care tasks. Baseline: 03/16/2024: 5/5 01/29/24: 5/5 overall Eval: Right Shoulder Flexion: 4-/5, Left Shoulder Flexion:4-/5, Right Shoulder Abduction: 4-/5, Left Shoulder Abduction: 4-/5 Goal  status: Achieved  7.  Pt will be able to independently and efficiently manipulate medication without dropping them.  Baseline: 04/11/24: Continue, recent revision 04/04/2024: 9 Hole Peg test: Right: 28 sec; Left: 33 sec. 01/29/24: Independent 01/15/24: Daughter currently manages medication set up.  Goal status: Ongoing, recent revision  8. Pt will increase typing speed to 20-30 words per minute with at least 90% accuracy to work towards more efficient typing for job related  responsibilities.  Baseline: 04/11/24: TBD 03/06/24: 10 wpm with 92% accuracy. 01/29/24: Continue 01/15/24: 8wpm x88% accuracy=7 wpm  Goal status: Ongoing  9. Pt will be able to scan small print on store receipts to check for errors with 100% accuracy using visual compensation strategies as needed.  Baseline:04/11/24: Pt. Continues to present with difficulty with visual scanning small  small items efficiently. 03/06/24: Pt. Continues to present with difficulty with visual scanning small  small items efficiently. 01/29/24: Pt. Continues to have difficulty scanning small print items. 01/15/24: Not yet attempted; required for return to work/job responsibility  Goal status:  Ongoing  10. Pt. will independently identify 8 items consistently from visual visual memory in preparation or ADLs.    Baseline: 04/11/24: 5-7 items  while seated at the tabletop. 03/06/24: Pt. is able to identify up to 6 picture items while seated at the tabletop. Pt. is able to consistently Identify 4 letters from visual memory when attention is divided.  01/29/24: Pt. is able to identify 6 items consistently from visual memory.    Gaol status: Ongoing      11. Pt. will be independently write 4 sentences  efficiently with 100% legibility, no deviation from writing on a blank line, and appropriate spacing  between letters.              Baseline: 04/11/24: Continue 03/06/24: 4 lines with 75% legibility, positive deviation below the line, and excessive spacing between the  words.              Goal status: Ongoing  ASSESSMENT:  CLINICAL IMPRESSION:  Patient was able to formulate cursive and printed  words while spacing 2 words per line with the 100% legibility in each form. Patient presented with difficulty following multistep directions during the sequential work task. Patient was able recall through visual memory 7 out of 10 items from each word list only working 1 list at a time. Pt. continues to benefit from Occupational Therapy services to improve her ability to use visual compensatory strategies, and improve overall BUE functioning in order to improve engagement in, and maximize overall independence with ADL, and IADL tasks.    PERFORMANCE DEFICITS: in functional skills including ADLs, IADLs, coordination, dexterity, Fine motor control, and vision, and psychosocial skills including coping strategies, environmental adaptation, habits, interpersonal interactions, and routines and behaviors.   IMPAIRMENTS: are limiting patient from ADLs, IADLs, rest and sleep, work, leisure, and social participation.   CO-MORBIDITIES: may have co-morbidities  that affects occupational performance. Patient will benefit from skilled OT to address above impairments and improve overall function.  MODIFICATION OR ASSISTANCE TO COMPLETE EVALUATION: Min-Moderate modification of tasks or assist with assess necessary to complete an evaluation.  OT OCCUPATIONAL PROFILE AND HISTORY: Detailed assessment: Review of records and additional review of physical, cognitive, psychosocial history related to current functional performance.  CLINICAL DECISION MAKING: Moderate - several treatment options, min-mod task modification necessary  REHAB POTENTIAL: Good  EVALUATION COMPLEXITY: Moderate    PLAN:  OT FREQUENCY: 2x/week  OT DURATION: 12 weeks  PLANNED INTERVENTIONS: 97168 OT Re-evaluation, 97535 self care/ADL training, 02889 therapeutic exercise, 97530 therapeutic activity, 97112  neuromuscular re-education, visual/perceptual remediation/compensation, patient/family education, and DME and/or AE instructions  RECOMMENDED OTHER SERVICES: ST  CONSULTED AND AGREED WITH PLAN OF CARE: Patient and family member/caregiver  PLAN FOR NEXT SESSION: Treatment  Richardson Otter, MS, OTR/L  04/24/24

## 2024-04-25 NOTE — Progress Notes (Signed)
 Subjective   Ashley Collins is a 63 y.o. female who presents for the following: Lesion(s) of concern . Patient is established patient .  Today patient reports: LOC at nose, rough spots at bilateral thighs, arms. Patient also wanting to be seeing for oil glands at under eyes. 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   Review of Systems:    No other skin or systemic complaints except as noted in HPI or Assessment and Plan.  The following portions of the chart were reviewed this encounter and updated as appropriate: medications, allergies, medical history  Relevant Medical History:  Personal history of non melanoma skin cancer - see medical history for full details  and Personal history of actinic keratosis   Objective  Well appearing patient in no apparent distress; mood and affect are within normal limits. Examination was performed of the: Focused Exam of: Face, legs, back   Examination notable for: Seborrheic Keratosis(es): Stuck-on appearing keratotic papule(s) on the trunk, some  irritated with redness, crusting, edema, and/or partial avulsion, Actinic Damage/Elastosis: chronic sun damage: dyspigmentation, telangiectasia, and wrinkling  - Multiple flesh-colored to yellow delled papules 2-33mm in size over forehead and midface  Examination limited by: Undergarments, Shoes or socks , Makeup , Clothing, and Patient deferred removal     Left forearm x1, left thigh x1, right thigh x1, R lower leg x3, back x3 (9) Stuck on waxy paps with erythema  Assessment & Plan    Benign Lesions/ Findings:  -Seborrheic Keratosis - Reassurance provided regarding the benign appearance of lesions noted on exam today; no treatment is indicated in the absence of symptoms/changes. - Reinforced importance of photoprotective strategies including liberal and frequent sunscreen use of a broad-spectrum SPF 30 or greater, use of protective clothing, and sun avoidance for prevention of cutaneous malignancy and photoaging.  Counseled  patient on the importance of regular self-skin monitoring as well as routine clinical skin examinations as scheduled.   ACTINIC DAMAGE - Chronic condition, secondary to cumulative UV/sun exposure - Recommend daily broad spectrum sunscreen SPF 30+ to sun-exposed areas, reapply every 2 hours as needed.  - Staying in the shade or wearing long sleeves, sun glasses (UVA+UVB protection) and wide brim hats (4-inch brim around the entire circumference of the hat) are also recommended for sun protection.  - Call for new or changing lesions.  Personal history of non melanoma skin cancer  - Reviewed medical history for full details  - Reviewed sun protective measures as above - Encouraged full body skin exams   FACIAL ELASTOSIS Exam: Rhytides and volume loss.  Treatment Plan: Start tretinoin  0.025% cream in the evening. Educated patient about proper use and potential side effects, including dryness, irritation, sun sensitivity, and transient worsening of acne. Start with applying three times a week gradually increase to using nightly.   Recommend daily broad spectrum sunscreen SPF 30+ to sun-exposed areas, reapply every 2 hours as needed. Call for new or changing lesions.  Staying in the shade or wearing long sleeves, sun glasses (UVA+UVB protection) and wide brim hats (4-inch brim around the entire circumference of the hat) are also recommended for sun protection.     Level of service outlined above   Procedures, orders, diagnosis for this visit:  INFLAMED SEBORRHEIC KERATOSIS (9) Left forearm x1, left thigh x1, right thigh x1, R lower leg x3, back x3 (9) Symptomatic, irritating, patient would like treated. Destruction of lesion - Left forearm x1, left thigh x1, right thigh x1, R lower leg x3, back x3 (9) Complexity:  simple   Destruction method: cryotherapy   Informed consent: discussed and consent obtained   Timeout:  patient name, date of birth, surgical site, and procedure verified Lesion  destroyed using liquid nitrogen: Yes   Region frozen until ice ball extended beyond lesion: Yes   Cryo cycles: 1 or 2. Outcome: patient tolerated procedure well with no complications   Post-procedure details: wound care instructions given   Additional details:  Prior to procedure, discussed risks of blister formation, small wound, skin dyspigmentation, or rare scar following cryotherapy. Recommend Vaseline ointment to treated areas while healing.    Inflamed seborrheic keratosis -     Destruction of lesion  Other orders -     Tretinoin ; Apply topically at bedtime. Apply 2-3 times weekly at night to dry skin after cleaning. Increase frequency up to nightly as tolerated.  Dispense: 20 g; Refill: 5    Return to clinic: Return if symptoms worsen or fail to improve.  I, Jacquelynn V. Wilfred, CMA, am acting as scribe for Lauraine JAYSON Kanaris, MD.  Documentation: I have reviewed the above documentation for accuracy and completeness, and I agree with the above.  Lauraine JAYSON Kanaris, MD

## 2024-04-30 ENCOUNTER — Ambulatory Visit

## 2024-04-30 ENCOUNTER — Ambulatory Visit: Admitting: Physical Therapy

## 2024-04-30 ENCOUNTER — Ambulatory Visit: Attending: Family Medicine | Admitting: Occupational Therapy

## 2024-04-30 DIAGNOSIS — M5459 Other low back pain: Secondary | ICD-10-CM | POA: Diagnosis present

## 2024-04-30 DIAGNOSIS — M546 Pain in thoracic spine: Secondary | ICD-10-CM

## 2024-04-30 DIAGNOSIS — H543 Unqualified visual loss, both eyes: Secondary | ICD-10-CM | POA: Diagnosis present

## 2024-04-30 DIAGNOSIS — R278 Other lack of coordination: Secondary | ICD-10-CM

## 2024-04-30 DIAGNOSIS — M6281 Muscle weakness (generalized): Secondary | ICD-10-CM | POA: Diagnosis present

## 2024-04-30 DIAGNOSIS — R41841 Cognitive communication deficit: Secondary | ICD-10-CM

## 2024-04-30 NOTE — Therapy (Signed)
 OUTPATIENT SPEECH LANGUAGE PATHOLOGY  TREATMENT   Patient Name: Ashley Collins MRN: [id number] DOB:[birth year], 63 y.o., female Today's Date: 04/30/2024  PCP: Gaetana Haddock, NP  REFERRING PROVIDER: same   End of Session - 04/30/24 1438     Visit Number 19    Number of Visits 24    Date for Recertification  05/14/24    SLP Start Time 1445    SLP Stop Time  1530    SLP Time Calculation (min) 45 min    Activity Tolerance Patient tolerated treatment well           Patient Active Problem List   Diagnosis Date Noted   Acute CVA (cerebrovascular accident) (HCC) 12/19/2023   Hypokalemia 12/19/2023   AKI (acute kidney injury) 12/19/2023   Leukocytosis 12/19/2023   Hyperlipidemia, unspecified 12/19/2023   Essential hypertension 12/19/2023   Vision changes 12/19/2023   Lymphedema 05/22/2023   Chronic venous insufficiency 05/22/2023   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 replacement therapy 04/10/2023   Insomnia due to medical condition 04/25/2022   GAD (generalized anxiety disorder) 02/02/2022   Other specified depressive episodes 02/02/2022   Long-term current use of benzodiazepine 02/02/2022   Migraine with aura and without status migrainosus, not intractable 11/03/2020   Arrhythmia 08/06/2020   Status migrainosus 02/25/2019   Osteoporosis 04/09/2013   Vitamin D deficiency 04/09/2013   DDD (degenerative disc disease), lumbosacral 02/08/2005    ONSET DATE: 12/19/23   REFERRING DIAG: CVA- memory deficits  THERAPY DIAG:  Cognitive communication deficit  Rationale for Evaluation and Treatment Rehabilitation  SUBJECTIVE:   SUBJECTIVE STATEMENT: Pt alert, pleasant, and cooperative. Pt accompanied by: self and significant other  PERTINENT HISTORY & DIAGNOSTIC FINDINGS: Pt is 63 y.o. female who presents today for a cognitive-communication evaluation in setting of stroke. MRI 12/18/23 1. 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 No associated  hemorrhage or mass effect. PMHx as outlined above.  PAIN:  Are you having pain? No   FALLS: Has patient fallen in last 6 months?  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 Pt recalled >5 details from written passage immediately and after delay with distraction. Pt benefited from writing information down to code it and extra time for recall.    PATIENT EDUCATION: Education details: as above Person educated: Patient and Spouse Education method: Explanation Education comprehension: verbalized understanding  HOME EXERCISE PROGRAM:        Continue compensations for attention and memory      GOALS:  Goals reviewed with patient? Yes  SHORT TERM GOALS: Target date: 10 sessions  Pt will complete PROM re: memory.  Baseline: Goal status: MET   2.  Pt will endorse successful implementation of at least x2 compensations for attention and memory.  Baseline:  Goal status: MET  3.  With Moderate A, patient will establish external aid for memory/executive function and bring to more than 50% of therapy sessions.    Baseline:  Goal status: MET    LONG TERM GOALS: Target date: 12 weeks  Pt will endorse improvement in cognitive-communication per PROM.  Baseline:  Goal status: PROGRESSING  2.  Pt and/or husband will demonstrate understanding of ways to promote and support cognitive-communication  outside of SLP sessions.  Baseline:  Goal status: PROGRESSING   ASSESSMENT:  CLINICAL IMPRESSION:  Pt is 63 y.o. female who presents today for a cognitive-communication treatment in setting of stroke. Initial assessment completed via formal means  (Cognitive-Linguistic Quick Test) and PROM (Neuro-QoL Adult Cognitive Function v2.0). Pt presents with at least mild cognitive-communication deficits affecting attention and visuospatial skills per CLQT as well as memory and executive functioning per PROM. Suspect CLQT is not as sensitive to higher level cognitive-communication deficits appreciated by pt during iADLs. See details of today's tx as outlined above. Recommend skilled ST services targeting above mentioned deficits to improve QoL and performance on ADLs/IADLs.  OBJECTIVE IMPAIRMENTS include attention, memory, executive functioning, and visuospatial deficits. These impairments are limiting patient from return to work, managing appointments, household responsibilities, and ADLs/IADLs. Factors affecting potential to achieve goals and functional outcome are medical prognosis.. Patient will benefit from skilled SLP services to address above impairments and improve overall function.  REHAB POTENTIAL: Good  PLAN: SLP FREQUENCY: 2x/week  SLP DURATION: 12 weeks  PLANNED INTERVENTIONS: Cueing hierachy, Cognitive reorganization, Internal/external aids, Functional tasks, SLP instruction and feedback, Compensatory strategies, and Patient/family education    Delon Bangs, M.S., CCC-SLP Speech-Language Pathologist [facility] [phone] [place])  [place] [facility] at [facility] [address] [place], [place], [address] Phone: [phone]   Fax:  [phone]

## 2024-04-30 NOTE — Therapy (Signed)
 Occupational Therapy Neuro Treatment Note   Patient Name: Ashley Collins MRN: [id number] DOB:Dec 15, 1960, 63 y.o., female Today's Date: 04/30/2024  PCP: Harvey Gaetana CROME, NP REFERRING PROVIDER: Harvey Gaetana CROME, NP   OT End of Session - 04/30/24 1326     Visit Number 36    Number of Visits 48    Date for Recertification  05/29/24    OT Start Time 1315    OT Stop Time 1400    OT Time Calculation (min) 45 min    Activity Tolerance Patient tolerated treatment well    Behavior During Therapy WFL for tasks assessed/performed              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   Cardiac arrhythmia    Nonspecific ST T wave changes on EKG   Chronic venous insufficiency of lower extremity    Complication of anesthesia    nausea and vomiting   DDD (degenerative disc disease), lumbosacral    Essential hypertension    Headache    History of kidney stones    Hyperlipidemia    Kidney stones    Lymphedema    Migraines    Osteoporosis    PONV (postoperative nausea and vomiting)    Right ureteral stone    Vitamin B12 deficiency    Vitamin D deficiency    Past Surgical History:  Procedure Laterality Date   AUGMENTATION MAMMAPLASTY     CESAREAN SECTION     x 4   COLONOSCOPY WITH PROPOFOL  N/A 03/31/2022   Procedure: COLONOSCOPY WITH PROPOFOL ;  Surgeon: Onita Elspeth Sharper, DO;  Location: [facility] ENDOSCOPY;  Service: Gastroenterology;  Laterality: N/A;   CYSTOSCOPY W/ RETROGRADES Bilateral 07/10/2020   Procedure: CYSTOSCOPY WITH RETROGRADE PYELOGRAM;  Surgeon: Francisca Redell BROCKS, MD;  Location: [facility] ORS;  Service: Urology;  Laterality: Bilateral;   CYSTOSCOPY/URETEROSCOPY/HOLMIUM LASER/STENT PLACEMENT     CYSTOSCOPY/URETEROSCOPY/HOLMIUM LASER/STENT PLACEMENT Bilateral 07/10/2020   Procedure: CYSTOSCOPY/URETEROSCOPY/HOLMIUM LASER/STENT PLACEMENT;  Surgeon: Francisca Redell BROCKS, MD;  Location: [facility] ORS;  Service:  Urology;  Laterality: Bilateral;   CYSTOSCOPY/URETEROSCOPY/HOLMIUM LASER/STENT PLACEMENT Right 08/25/2023   Procedure: CYSTOSCOPY/URETEROSCOPY/HOLMIUM LASER;  Surgeon: Francisca Redell BROCKS, MD;  Location: [facility] ORS;  Service: Urology;  Laterality: Right;   CYSTOSCOPY/URETEROSCOPY/HOLMIUM LASER/STENT PLACEMENT Right 12/15/2023   Procedure: CYSTOSCOPY/URETEROSCOPY/HOLMIUM LASER;  Surgeon: Francisca Redell BROCKS, MD;  Location: [facility] ORS;  Service: Urology;  Laterality: Right;   EXTRACORPOREAL SHOCK WAVE LITHOTRIPSY     x 10 plus   Eye Lift     FACIAL COSMETIC SURGERY     GANGLION CYST EXCISION Right 12/21/2021   Procedure: REMOVAL GANGLION OF WRIST;  Surgeon: Kathlynn Sharper, MD;  Location: [facility] ORS;  Service: Orthopedics;  Laterality: Right;   LIPOSUCTION     TONSILLECTOMY     TRIGGER FINGER RELEASE Right 12/21/2021   Procedure: RELEASE TRIGGER FINGER/A-1 PULLEY;  Surgeon: Kathlynn Sharper, MD;  Location: [facility] ORS;  Service: Orthopedics;  Laterality: Right;   URETEROSCOPY WITH HOLMIUM LASER LITHOTRIPSY     Patient Active Problem List   Diagnosis Date Noted   Acute CVA (cerebrovascular accident) (HCC) 12/19/2023   Hypokalemia 12/19/2023   AKI (acute kidney injury) 12/19/2023   Leukocytosis 12/19/2023   Hyperlipidemia, unspecified 12/19/2023   Essential hypertension 12/19/2023   Vision changes 12/19/2023   Lymphedema 05/22/2023   Chronic venous insufficiency 05/22/2023   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 replacement therapy 04/10/2023   Insomnia  due to medical condition 04/25/2022   GAD (generalized anxiety disorder) 02/02/2022   Other specified depressive episodes 02/02/2022   Long-term current use of benzodiazepine 02/02/2022   Migraine with aura and without status migrainosus, not intractable 11/03/2020   Arrhythmia 08/06/2020   Status migrainosus 02/25/2019   Osteoporosis 04/09/2013   Vitamin D deficiency 04/09/2013   DDD (degenerative disc disease), lumbosacral 02/08/2005   ONSET DATE:  12/18/2023  REFERRING DIAG:   THERAPY DIAG:  Muscle weakness (generalized)  Other lack of coordination  Rationale for Evaluation and Treatment: Rehabilitation  SUBJECTIVE:  SUBJECTIVE STATEMENT:   Pt.  reports that she plans to return to work at reduced hours in January after the holiday season winds down.  Pt accompanied by: significant other  PERTINENT HISTORY: Pt. has Hx of stroke with onset 12/18/2023. Pt. PMHx includes: HTN, Hyperlipidemia, Kidney Stones, near syncopal event, loss of vision.  PRECAUTIONS: None  WEIGHT BEARING RESTRICTIONS: None  PAIN:  Are you having pain? No  FALLS: Has patient fallen in last 6 months? Yes. Number of falls    LIVING ENVIRONMENT: Lives with: lives with their family Lives in: [place], [place] Stairs: No, inside the house, yes but does not use Has following equipment at home:   PLOF: Independent  PATIENT GOALS: To be able to see  OBJECTIVE:   HAND DOMINANCE: Right  ADLs:  Eating: Drinking from straw is different, able to use utensils.  Grooming: Fatigues completing hair care.  UB Dressing: independent, if clothes are in front of her she can find clothing LB Dressing: Independent Toileting: Independent Bathing: Independent Tub Shower transfers: Independent Equipment: none Has difficulty with using cell phone  IADLs: Shopping: Does not typically go out shopping, and has not tried to. Light housekeeping: Pt. reports that she does not typically do house cleaning. Pt. has been able to unpack belongings from boxes due to her recent move in. Meal Prep: Pt. reports that is does not currently cook, however reports that she probably could. Has difficulty opening bottle caps/lids. Community mobility: Requires assistance from husband to navigate  through buildings, negotiate stairs-Pt. with increased fear of falling  Medication management: Able to push down medication bottles to open  Financial management: No change in the process-uses  automatic bill pay system.  Has difficulty with using cell phone Handwriting: N/T Work: Pt. was actively working managing a health visitor, works a lot of hours  MOBILITY STATUS: Independent and Needs Assist: Requires hand on hand assistance with nvigating through environments.   POSTURE COMMENTS:  No Significant postural limitations Sitting balance: Good  ACTIVITY TOLERANCE: Activity tolerance: Good  FUNCTIONAL OUTCOME MEASURES:   UPPER EXTREMITY ROM:    Active ROM Right Eval WFL Left Eval [facility]  Shoulder flexion    Shoulder abduction    Shoulder adduction    Shoulder extension    Shoulder internal rotation    Shoulder external rotation    Elbow flexion    Elbow extension    Wrist flexion    Wrist extension    Wrist ulnar deviation    Wrist radial deviation    Wrist pronation    Wrist supination    (Blank rows = not tested)  UPPER EXTREMITY MMT:     MMT Right eval Right 01/29/24 Right  04/04/24 Left eval Left 01/29/24 Left 04/04/24  Shoulder flexion 4-/5 5/5 5/5 4-/5 5/5 5/5  Shoulder abduction 4-/5 5/5 5/5 4-/5 5/5 5/5  Shoulder adduction        Shoulder extension  Shoulder internal rotation        Shoulder external rotation        Middle trapezius        Lower trapezius        Elbow flexion 5/5 5/5 5/5 5/5 5/5 5/5  Elbow extension 5/5 5/5 5/5 5/5 5/5 5/5  Wrist flexion 4/5 5/5 5/5 4-/5 5/5 5/5  Wrist extension 4/5 5/5 5/5 4-/5 5/5 5/5  Wrist ulnar deviation        Wrist radial deviation        Wrist pronation        Wrist supination        (Blank rows = not tested)  HAND FUNCTION: Grip strength: Right: 39 lbs; Left: 28 lbs, Lateral pinch: Right: 11 lbs, Left: 9 lbs, and 3 point pinch: Right: 9 lbs, Left: 9 lbs  01/29/24 Grip strength: Right: 39 lbs; Left: 36 lbs, Lateral pinch: Right: 14 lbs, Left: 12 lbs, and 3 point pinch: Right: 12 lbs, Left: 11 lbs  04/04/24:  Grip strength: Right: 37 lbs; Left: 42 lbs    COORDINATION: 9 Hole  Peg test: Right: 30 sec; Left: 36 sec  01/29/24 9 Hole Peg test: Right: 24 sec; Left: 23 sec.  04/04/24:  9 Hole Peg test: Right: 28 sec; Left: 33 sec.  SENSATION: WFL  EDEMA:   MUSCLE TONE:   COGNITION: Overall cognitive status: Within functional limits for tasks assessed  VISION: Subjective report: Pt. reports changes in her vision at onset of stroke, starting with blurry vision, resulting in vision loss in the R eye.  Baseline vision: Pt. Is able to visually track  Visual history: Hx of eye surgery  VISION ASSESSMENT: TBD  PERCEPTION: WFL  PRAXIS: WFL  OBSERVATIONS:                                                                                                                    TREATMENT DATE: 04/30/24   Therapeutic activities:   -Facilitated right hand progressive right gross grip strengthening using a 23.4# gross gripper, and sustaining grip while reaching up through multiple planes to discard the pegs into container- 2 trials were completed. -Incorporated a reaching component through multiple planes in combination with sustaining gross grip. -Facilitated Lateral, and 3pt. pinch strengthening using yellow, red, green, and blue level resistive clips.  -Facilitated translatory movements moving clips from the lateral pinch position to the 3pt. pinch position in preparation for securely placing them on the dowel to promote hand function skills. -Facilitated right hand FMC tasks using the Grooved pegboard, grasping 1 grooved pegs from a horizontal position in the shallow dish, and transitioning them to a vertical position in preparation for placing them upright into the pegboard.  -Patient worked on removing the grooved pegs alternating thumb opposition to the 2nd through 5th digit every 4 -Patient worked with  the board positioned flat at the tabletop surface followed by an elevated vertical plane to further challenge [facility] skills  -Pt. worked on psychiatrist  recalling details in the rows on the grooved pegboard.    HOME EXERCISE PROGRAM:  Ida Theraputty exercises for  hand strengthening at bridge -Visual perceptual: Flip flop target design patterns. -Visual memory tasks -Writing tasks -visual motor tasks.  GOALS: Goals reviewed with patient? Yes  SHORT TERM GOALS: Target date: 04/17/2024    Pt. Will independently utilize HEP for hand strength, coordination, and visual compensatory strategies ADL/ADLs Baseline: 04/12/23: Independent, continue 03/06/24: Independent Eval: No  current HEP  Goal status: Ongoing   LONG TERM GOALS: Target date: 05/29/2024   Pt. Will be able to independently implement visual scanning/visual search compensatory strategies for tasks within her extra personal space navigating through community environments 100% of the time.  Baseline: 01/29/24:  Independent  100% of the time within the therapy gym, and hallway. Eval: Pt. Requires increased assist to navigate through community environments. Goal status: Achieved  2.  Pt. Will be able to independently utilize visual scanning/visual search strategies  during ADLs/IADLs within her near space, and during tabletop tasks, and tasks at a vertical plane with 100% accuracy. Baseline: 04/11/24: 80% simple to moderately complex visual scanning tasks however 70% accuracy for complex visual scanning tasks, and scanning at a vertical plane. 03/06/24: Initiates visual scanning 85% of the time  01/29/24: 75% Eval: Pt. Education to be provided.  Goal status: revised-04/11/24  3.  Pt. Will be able to independently initiate visual scanning techniques in her own environment to reduce risk of falls.  Baseline: 01/29/24: Education was provided, Pt. is utlizing visual compensatory strategies/visual scanning within her home. Eval: Pt. Education to be provided.  Goal status: Achieved  4.  Pt. Will increase BUE Grip Strength by 5# to be able to independently and securely hold objects for  ADL/IADL use. Baseline:  04/11/24: Continue- recent revision 04/04/2024: Grip strength: Right: 37 lbs; Left: 42 lbs patient is dropping items from her hands 01/29/24: Grip strength: Right: 39 lbs; Left: 28 lbs, Eval: Right Grip: 39#, Left Grip: 28# Goal status: Ongoing-recent revision  5.  Pt. Will increase L Lateral Pinch strength by 2# to be able to open bottles. Baseline: 04/04/2024: Grip strength: Right: 37 lbs; Left: 42 lbs 01/29/24: Lateral pinch: Right: 14 lbs, Left: 12 lbs Eval: Lateral pinch: Right: 11 lbs, Left: 9 lbs  Goal status: Achieved  6.  Pt. Will increase BUE strength for shoulder flexion/abduction by 2 mm grades to independently complete hair care tasks. Baseline: 03/16/2024: 5/5 01/29/24: 5/5 overall Eval: Right Shoulder Flexion: 4-/5, Left Shoulder Flexion:4-/5, Right Shoulder Abduction: 4-/5, Left Shoulder Abduction: 4-/5 Goal status: Achieved  7.  Pt will be able to independently and efficiently manipulate medication without dropping them.  Baseline: 04/11/24: Continue, recent revision 04/04/2024: 9 Hole Peg test: Right: 28 sec; Left: 33 sec. 01/29/24: Independent 01/15/24: Daughter currently manages medication set up.  Goal status: Ongoing, recent revision  8. Pt will increase typing speed to 20-30 words per minute with at least 90% accuracy to work towards more efficient typing for job related  responsibilities.  Baseline: 04/11/24: TBD 03/06/24: 10 wpm with 92% accuracy. 01/29/24: Continue 01/15/24: 8wpm x88% accuracy=7 wpm  Goal status: Ongoing  9. Pt will be able to scan small print on store receipts to check for errors with 100% accuracy using visual compensation strategies as needed.  Baseline:04/11/24: Pt. Continues to present with difficulty with visual scanning small  small items efficiently. 03/06/24: Pt. Continues to present with difficulty with visual scanning small  small items efficiently. 01/29/24: Pt. Continues to have  difficulty scanning small print items. 01/15/24:  Not yet attempted; required for return to work/job responsibility  Goal status:  Ongoing  10. Pt. will independently identify 8 items consistently from visual visual memory in preparation or ADLs.    Baseline: 04/11/24: 5-7 items  while seated at the tabletop. 03/06/24: Pt. is able to identify up to 6 picture items while seated at the tabletop. Pt. is able to consistently Identify 4 letters from visual memory when attention is divided.  01/29/24: Pt. is able to identify 6 items consistently from visual memory.    Gaol status: Ongoing      11. Pt. will be independently write 4 sentences  efficiently with 100% legibility, no deviation from writing on a blank line, and appropriate spacing between letters.              Baseline: 04/11/24: Continue 03/06/24: 4 lines with 75% legibility, positive deviation below the line, and excessive spacing between the words.              Goal status: Ongoing  ASSESSMENT:  CLINICAL IMPRESSION:  Pt. was able to perform the gross grip and pinch strengthening tasks with cues for form and technique initially for each of the tasks.  Patient was able to perform fine motor coordination skills  efficiently with the board positioned both flat at the tabletop surface followed by at a vertical plane. Pt. presented with difficulty recalling specific  details of the task and grooved pegboard via visual memory. Pt. continues to benefit from Occupational Therapy services to improve her ability to use visual compensatory strategies, improve visual memory skills and improve overall BUE functioning in order to improve engagement in, and maximize overall independence with ADLs, and IADL tasks.    PERFORMANCE DEFICITS: in functional skills including ADLs, IADLs, coordination, dexterity, Fine motor control, and vision, and psychosocial skills including coping strategies, environmental adaptation, habits, interpersonal interactions, and routines and behaviors.   IMPAIRMENTS: are limiting  patient from ADLs, IADLs, rest and sleep, work, leisure, and social participation.   CO-MORBIDITIES: may have co-morbidities  that affects occupational performance. Patient will benefit from skilled OT to address above impairments and improve overall function.  MODIFICATION OR ASSISTANCE TO COMPLETE EVALUATION: Min-Moderate modification of tasks or assist with assess necessary to complete an evaluation.  OT OCCUPATIONAL PROFILE AND HISTORY: Detailed assessment: Review of records and additional review of physical, cognitive, psychosocial history related to current functional performance.  CLINICAL DECISION MAKING: Moderate - several treatment options, min-mod task modification necessary  REHAB POTENTIAL: Good  EVALUATION COMPLEXITY: Moderate    PLAN:  OT FREQUENCY: 2x/week  OT DURATION: 12 weeks  PLANNED INTERVENTIONS: 97168 OT Re-evaluation, 97535 self care/ADL training, 02889 therapeutic exercise, 97530 therapeutic activity, 97112 neuromuscular re-education, visual/perceptual remediation/compensation, patient/family education, and DME and/or AE instructions  RECOMMENDED OTHER SERVICES: ST  CONSULTED AND AGREED WITH PLAN OF CARE: Patient and family member/caregiver  PLAN FOR NEXT SESSION: Treatment  Richardson Otter, MS, OTR/L  04/30/24

## 2024-04-30 NOTE — Therapy (Signed)
 OUTPATIENT PHYSICAL THERAPY THORACOLUMBAR TREATMENT   Patient Name: Ashley Collins MRN: [id number] DOB:03/06/1961, 63 y.o., female Today's Date: 04/30/2024  END OF SESSION:  PT End of Session - 04/30/24 1408     Visit Number 2    Number of Visits 16    Date for Recertification  06/20/24    Authorization Type [employer] HMO/PPO    Progress Note Due on Visit 10    PT Start Time 1403    PT Stop Time 1442    PT Time Calculation (min) 39 min    Activity Tolerance Patient tolerated treatment well;Patient limited by pain    Behavior During Therapy WFL for tasks assessed/performed           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   Cardiac arrhythmia    Nonspecific ST T wave changes on EKG   Chronic venous insufficiency of lower extremity    Complication of anesthesia    nausea and vomiting   DDD (degenerative disc disease), lumbosacral    Essential hypertension    Headache    History of kidney stones    Hyperlipidemia    Kidney stones    Lymphedema    Migraines    Osteoporosis    PONV (postoperative nausea and vomiting)    Right ureteral stone    Vitamin B12 deficiency    Vitamin D deficiency    Past Surgical History:  Procedure Laterality Date   AUGMENTATION MAMMAPLASTY     CESAREAN SECTION     x 4   COLONOSCOPY WITH PROPOFOL  N/A 03/31/2022   Procedure: COLONOSCOPY WITH PROPOFOL ;  Surgeon: Onita Elspeth Sharper, DO;  Location: [facility];  Service: Gastroenterology;  Laterality: N/A;   CYSTOSCOPY W/ RETROGRADES Bilateral 07/10/2020   Procedure: CYSTOSCOPY WITH RETROGRADE PYELOGRAM;  Surgeon: Francisca Redell BROCKS, MD;  Location: [facility];  Service: Urology;  Laterality: Bilateral;   CYSTOSCOPY/URETEROSCOPY/HOLMIUM LASER/STENT PLACEMENT     CYSTOSCOPY/URETEROSCOPY/HOLMIUM LASER/STENT PLACEMENT Bilateral 07/10/2020   Procedure: CYSTOSCOPY/URETEROSCOPY/HOLMIUM LASER/STENT PLACEMENT;  Surgeon:  Francisca Redell BROCKS, MD;  Location: [facility];  Service: Urology;  Laterality: Bilateral;   CYSTOSCOPY/URETEROSCOPY/HOLMIUM LASER/STENT PLACEMENT Right 08/25/2023   Procedure: CYSTOSCOPY/URETEROSCOPY/HOLMIUM LASER;  Surgeon: Francisca Redell BROCKS, MD;  Location: [facility];  Service: Urology;  Laterality: Right;   CYSTOSCOPY/URETEROSCOPY/HOLMIUM LASER/STENT PLACEMENT Right 12/15/2023   Procedure: CYSTOSCOPY/URETEROSCOPY/HOLMIUM LASER;  Surgeon: Francisca Redell BROCKS, MD;  Location: [facility];  Service: Urology;  Laterality: Right;   EXTRACORPOREAL SHOCK WAVE LITHOTRIPSY     x 10 plus   Eye Lift     FACIAL COSMETIC SURGERY     GANGLION CYST EXCISION Right 12/21/2021   Procedure: REMOVAL GANGLION OF WRIST;  Surgeon: Kathlynn Sharper, MD;  Location: [facility];  Service: Orthopedics;  Laterality: Right;   LIPOSUCTION     TONSILLECTOMY     TRIGGER FINGER RELEASE Right 12/21/2021   Procedure: RELEASE TRIGGER FINGER/A-1 PULLEY;  Surgeon: Kathlynn Sharper, MD;  Location: [facility];  Service: Orthopedics;  Laterality: Right;   URETEROSCOPY WITH HOLMIUM LASER LITHOTRIPSY     Patient Active Problem List   Diagnosis Date Noted   Acute CVA (cerebrovascular accident) (HCC) 12/19/2023   Hypokalemia 12/19/2023   AKI (acute kidney injury) 12/19/2023   Leukocytosis 12/19/2023   Hyperlipidemia, unspecified 12/19/2023   Essential hypertension 12/19/2023   Vision changes 12/19/2023   Lymphedema 05/22/2023   Chronic venous insufficiency 05/22/2023   Menopausal syndrome  on hormone replacement therapy 04/10/2023   Insomnia due to medical condition 04/25/2022   GAD (generalized anxiety disorder) 02/02/2022   Other specified depressive episodes 02/02/2022   Long-term current use of benzodiazepine 02/02/2022   Migraine with aura and without status migrainosus, not intractable 11/03/2020   Arrhythmia 08/06/2020   Status migrainosus 02/25/2019   Osteoporosis 04/09/2013   Vitamin D deficiency 04/09/2013   DDD (degenerative disc  disease), lumbosacral 02/08/2005    PCP: Harvey Gaetana CROME, NP   REFERRING PROVIDER: Meeler, Benton CROME, FNP   REFERRING DIAG: M54.16 (ICD-10-CM) - Lumbar radiculitis   Rationale for Evaluation and Treatment: Rehabilitation  THERAPY DIAG:  Muscle weakness (generalized)  Other low back pain  Other lack of coordination  Pain in thoracic spine  ONSET DATE: 1.5 months   SUBJECTIVE:                                                                                                                                                                                           SUBJECTIVE STATEMENT: Pt reports continued low back pain. No changes to note since last visit.   From Eval: Pt here to work on pain and discomfort in her mid back. Pt reports the more she is on her feet the more her back hurts and relief comes from being supine. Pt has not had any previous therapy for her back pain. Pt has epidural that assists with her pain below her belt line. Pt still in OT working on her upper extremity and grip strength. Pt has epidural shots for last 3 years. Pt initially hurt her back lifting something to put in her car. Patient can stand for maybe 30 min at a time before pain is no longer tolerable. She reports that sitting down does not improve her pain but lying on her side in would bring relief. Pt has history of osteoporosis and sliped disc in her back. Pt pain in this area is similar to pain she gets epidural for but just further superior into her thoracic spine.    PERTINENT HISTORY:  Patient familiar to this clinic before.  Was previously seen following CVA for mobility and balance deficits.  Still seeing speech and language pathologist and occupational therapy.  PAIN:  Are you having pain? Yes: NPRS scale: 3-10 ( at end of day everyday) Pain location: above belt line and radiating laterally  Pain description: nagging, throbbing pain  Aggravating factors: Standing Relieving factors: Lying  down  PRECAUTIONS: None  RED FLAGS: None   WEIGHT BEARING RESTRICTIONS: No  FALLS:  Has patient fallen in last 6 months? No  LIVING ENVIRONMENT: Lives with: lives  with SO Lives in: House/apartment Stairs: No Has following equipment at home: None  OCCUPATION: [employer]   PLOF: Independent and Independent with basic ADLs  PATIENT GOALS: improve back pain   NEXT MD VISIT: Not scheduled   OBJECTIVE:   Note: Objective measures were completed at Evaluation unless otherwise noted.  DIAGNOSTIC FINDINGS:  No recent images since to 2023 were mild degenerative changes in the lumbar spine were noted  PATIENT SURVEYS:  Modified Oswestry:  MODIFIED OSWESTRY DISABILITY SCALE  Date: 04/25/24 Score  Pain intensity 5 =  Pain medication has no effect on my pain.  2. Personal care (washing, dressing, etc.) 0 =  I can take care of myself normally without causing increased pain.  3. Lifting 5 =  I cannot lift or carry anything at all.  4. Walking 3 =  Pain prevents me from walking more than  mile.  5. Sitting 3 =  Pain prevents me from sitting more than  hour.  6. Standing 2 =  Pain prevents me from standing more than 1 hour  7. Sleeping 1 = I can sleep well only by using pain medication.  8. Social Life 2 = Pain prevents me from participating in more energetic activities (eg. sports, dancing).  9. Traveling 2 =  My pain restricts my travel over 2 hours.  10. Employment/ Homemaking 3 = Pain prevents me from doing anything but light duties.  Total 26/50   Interpretation of scores: Score Category Description  0-20% Minimal Disability The patient can cope with most living activities. Usually no treatment is indicated apart from advice on lifting, sitting and exercise  21-40% Moderate Disability The patient experiences more pain and difficulty with sitting, lifting and standing. Travel and social life are more difficult and they may be disabled from work. Personal care, sexual  activity and sleeping are not grossly affected, and the patient can usually be managed by conservative means  41-60% Severe Disability Pain remains the main problem in this group, but activities of daily living are affected. These patients require a detailed investigation  61-80% Crippled Back pain impinges on all aspects of the patient's life. Positive intervention is required  81-100% Bed-bound These patients are either bed-bound or exaggerating their symptoms  Bluford FORBES Zoe DELENA Karon DELENA, et al. Surgery versus conservative management of stable thoracolumbar fracture: the PRESTO feasibility RCT. [place] ([place]): [employer]; 2021 Nov. [facility] Technology Assessment, No. 25.62.) Appendix 3, Oswestry Disability Index category descriptors. Available from: Findjewelers.cz  Minimally Clinically Important Difference (MCID) = 12.8%  COGNITION: Overall cognitive status: Within functional limits for tasks assessed     SENSATION: WFL  MUSCLE LENGTH: Hamstrings: WFL during DLLT   POSTURE: rounded shoulders  PALPATION: Tender to palpation along lumbar paraspinals, origin of gluteal muscles, and some along thoracic paraspinals.  Patient had most pain along lumbar paraspinals and gluteal musculature on the left greater than right side  LUMBAR ROM:   AROM eval  Flexion WNL  Extension Min - tight   Right lateral flexion To knee- tight  Left lateral flexion To knee - tight   Right rotation WNL- tight   Left rotation WNL tight    (Blank rows = not tested)  LOWER EXTREMITY ROM:    Further hip range of motion may be beneficial  LOWER EXTREMITY MMT:    MMT Right eval Left eval  Hip flexion 4 4  Hip extension    Hip abduction 4+ 4+  Hip adduction 5 5  Hip internal rotation    Hip external rotation    Knee flexion 5 5  Knee extension 5 5  Ankle dorsiflexion 5 5  Ankle plantarflexion    Ankle inversion    Ankle eversion     (Blank rows = not  tested) Measured in seated position   LUMBAR SPECIAL TESTS:  DLLT: could not sustain past 60 degrees  FUNCTIONAL TESTS:     TREATMENT DATE: 04/30/24   TE- To improve strength, endurance, mobility, and function of specific targeted muscle groups or improve joint range of motion or improve muscle flexibility  Nustep B UE and LE movement training with MHP on lower back throughout x 8 min L 2 - reports pain improvement with activity   Manual therapy   STM to L gluteal region near origin and Lumbar paraspinals. X 12 min   TE- To improve strength, endurance, mobility, and function of specific targeted muscle groups or improve joint range of motion or improve muscle flexibility  All 4s LE bird dog x 10 ea   Sl clamshell on R LE 2 x 10   SL open book stretch x 10 reps   Supine hooklying:   Clamshell GTB 2 x 15 reps x 2 sec hold   Supine TrA contraction  2 x 15 - instruction to make maneuver like getting into tight dress    PATIENT EDUCATION: Education details: POC Person educated: Patient Education method: Explanation Education comprehension: verbalized understanding   HOME EXERCISE PROGRAM: Access Code: [email or web address] URL: [email or web address] Date: 04/30/2024 Prepared by: Lonni Gainer  Exercises - Clamshell  - 1 x daily - 7 x weekly - 3 sets - 10 reps - Bird Dog Progression  - 1 x daily - 7 x weekly - 1 sets - 10 reps - Sidelying Thoracic Rotation with Open Book  - 1 x daily - 7 x weekly - 1 sets - 10 reps - 5 sec hold   ASSESSMENT:  CLINICAL IMPRESSION:  Patient arrived with good motivation for completion of pt activities.  Pt responds well to nustep with heat reporting improved pain levels in back following. Pt had significant trigger points in her gluteal musculature this date and reported improvement from IASTM this date and subsequent therapeutic exercise to address muscles targeted with IATSM. Pt provided with initial HEP and goes over every  exercise in session to ensure proper performance. Pt will continue to benefit from skilled physical therapy intervention to address impairments, improve QOL, and attain therapy goals.    OBJECTIVE IMPAIRMENTS: Abnormal gait.   ACTIVITY LIMITATIONS: carrying, standing, squatting, and locomotion level  PARTICIPATION LIMITATIONS: meal prep, cleaning, shopping, community activity, and occupation  PERSONAL FACTORS: Past/current experiences, Time since onset of injury/illness/exacerbation, and 3+ comorbidities: HTN, HLD, osteoporosis, B12 deficiency are also affecting patient's functional outcome.   REHAB POTENTIAL: Good  CLINICAL DECISION MAKING: Stable/uncomplicated  EVALUATION COMPLEXITY: Low   GOALS: Goals reviewed with patient? Yes  SHORT TERM GOALS: Target date: 05/23/2024       Patient will be independent in home exercise program to improve strength/mobility for better functional independence with ADLs. Baseline: No HEP currently  Goal status: INITIAL   LONG TERM GOALS: Target date: 06/19/2024    Patient will improve modified Oswestry pain disability questionnaire by 10 points or greater in order to indicate improvement in her subjective level of back pain as well as her ability to complete functional tasks without pain limitations Baseline: 26 Goal status: INITIAL  2.  Patient will improve double leg  limb lowering test from 60 degrees to 30 degrees or greater showing good core control throughout in order to indicate improved strength of anterior core muscles for improved lumbar stability Baseline: 60 degrees patient loses control and has some discomfort Goal status: INITIAL  3.  Patient will report ability to stand for greater than 1 hour without limitations of pain over to allow her to return to work-related tasks as well as shopping related tasks with less limitation Baseline: When patient stands for 30 minutes per her report she starts to get intense pain that is  only relieved by lying down Goal status: INITIAL  4.  Patient report ability to lift light objects from the floor and set them on an object such as a table in order without increase in back pain to show improved functional capacity as well as improved ability to return to work-related tasks Baseline: Patient reports she cannot lift or carry anything at all  Goal status: INITIAL    PLAN:  PT FREQUENCY: 2x/week  PT DURATION: 8 weeks  PLANNED INTERVENTIONS: 97750- Physical Performance Testing, 97110-Therapeutic exercises, 97530- Therapeutic activity, [id number]- Neuromuscular re-education, 97535- Self Care, [id number]- Manual therapy, [id number]- Gait training, [phone] (1-2 muscles), [id number] (3+ muscles)- Dry Needling, Patient/Family education, Balance training, Stair training, and Moist heat.  PLAN FOR NEXT SESSION: hip assessment, STM or IASTM to paraspinals, QL and/ or gluteal origin Standing endurance - Standing side plank and bird dog on wall    Lonni KATHEE Gainer, PT 04/30/2024, 2:09 PM

## 2024-05-02 ENCOUNTER — Ambulatory Visit: Admitting: Physical Therapy

## 2024-05-02 ENCOUNTER — Ambulatory Visit

## 2024-05-02 ENCOUNTER — Ambulatory Visit: Admitting: Occupational Therapy

## 2024-05-02 ENCOUNTER — Encounter: Payer: Self-pay | Admitting: Occupational Therapy

## 2024-05-02 DIAGNOSIS — H543 Unqualified visual loss, both eyes: Secondary | ICD-10-CM

## 2024-05-02 DIAGNOSIS — M5459 Other low back pain: Secondary | ICD-10-CM

## 2024-05-02 DIAGNOSIS — R41841 Cognitive communication deficit: Secondary | ICD-10-CM

## 2024-05-02 DIAGNOSIS — M6281 Muscle weakness (generalized): Secondary | ICD-10-CM

## 2024-05-02 DIAGNOSIS — R278 Other lack of coordination: Secondary | ICD-10-CM

## 2024-05-02 DIAGNOSIS — M546 Pain in thoracic spine: Secondary | ICD-10-CM

## 2024-05-02 NOTE — Therapy (Signed)
 OUTPATIENT PHYSICAL THERAPY THORACOLUMBAR TREATMENT   Patient Name: Ashley Collins MRN: [id number] DOB:Nov 12, 1960, 63 y.o., female Today's Date: 05/02/2024  END OF SESSION:  PT End of Session - 05/02/24 1324     Visit Number 3    Number of Visits 16    Date for Recertification  06/20/24    Authorization Type [employer] HMO/PPO    Progress Note Due on Visit 10    PT Start Time 1318    PT Stop Time 1357    PT Time Calculation (min) 39 min    Activity Tolerance Patient tolerated treatment well;Patient limited by pain    Behavior During Therapy WFL for tasks assessed/performed           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   Cardiac arrhythmia    Nonspecific ST T wave changes on EKG   Chronic venous insufficiency of lower extremity    Complication of anesthesia    nausea and vomiting   DDD (degenerative disc disease), lumbosacral    Essential hypertension    Headache    History of kidney stones    Hyperlipidemia    Kidney stones    Lymphedema    Migraines    Osteoporosis    PONV (postoperative nausea and vomiting)    Right ureteral stone    Vitamin B12 deficiency    Vitamin D deficiency    Past Surgical History:  Procedure Laterality Date   AUGMENTATION MAMMAPLASTY     CESAREAN SECTION     x 4   COLONOSCOPY WITH PROPOFOL  N/A 03/31/2022   Procedure: COLONOSCOPY WITH PROPOFOL ;  Surgeon: Onita Elspeth Sharper, DO;  Location: [facility] ENDOSCOPY;  Service: Gastroenterology;  Laterality: N/A;   CYSTOSCOPY W/ RETROGRADES Bilateral 07/10/2020   Procedure: CYSTOSCOPY WITH RETROGRADE PYELOGRAM;  Surgeon: Francisca Redell BROCKS, MD;  Location: [facility] ORS;  Service: Urology;  Laterality: Bilateral;   CYSTOSCOPY/URETEROSCOPY/HOLMIUM LASER/STENT PLACEMENT     CYSTOSCOPY/URETEROSCOPY/HOLMIUM LASER/STENT PLACEMENT Bilateral 07/10/2020   Procedure: CYSTOSCOPY/URETEROSCOPY/HOLMIUM LASER/STENT PLACEMENT;  Surgeon:  Francisca Redell BROCKS, MD;  Location: [facility] ORS;  Service: Urology;  Laterality: Bilateral;   CYSTOSCOPY/URETEROSCOPY/HOLMIUM LASER/STENT PLACEMENT Right 08/25/2023   Procedure: CYSTOSCOPY/URETEROSCOPY/HOLMIUM LASER;  Surgeon: Francisca Redell BROCKS, MD;  Location: [facility] ORS;  Service: Urology;  Laterality: Right;   CYSTOSCOPY/URETEROSCOPY/HOLMIUM LASER/STENT PLACEMENT Right 12/15/2023   Procedure: CYSTOSCOPY/URETEROSCOPY/HOLMIUM LASER;  Surgeon: Francisca Redell BROCKS, MD;  Location: [facility] ORS;  Service: Urology;  Laterality: Right;   EXTRACORPOREAL SHOCK WAVE LITHOTRIPSY     x 10 plus   Eye Lift     FACIAL COSMETIC SURGERY     GANGLION CYST EXCISION Right 12/21/2021   Procedure: REMOVAL GANGLION OF WRIST;  Surgeon: Kathlynn Sharper, MD;  Location: [facility] ORS;  Service: Orthopedics;  Laterality: Right;   LIPOSUCTION     TONSILLECTOMY     TRIGGER FINGER RELEASE Right 12/21/2021   Procedure: RELEASE TRIGGER FINGER/A-1 PULLEY;  Surgeon: Kathlynn Sharper, MD;  Location: [facility] ORS;  Service: Orthopedics;  Laterality: Right;   URETEROSCOPY WITH HOLMIUM LASER LITHOTRIPSY     Patient Active Problem List   Diagnosis Date Noted   Acute CVA (cerebrovascular accident) (HCC) 12/19/2023   Hypokalemia 12/19/2023   AKI (acute kidney injury) 12/19/2023   Leukocytosis 12/19/2023   Hyperlipidemia, unspecified 12/19/2023   Essential hypertension 12/19/2023   Vision changes 12/19/2023   Lymphedema 05/22/2023   Chronic venous insufficiency 05/22/2023   Menopausal syndrome  on hormone replacement therapy 04/10/2023   Insomnia due to medical condition 04/25/2022   GAD (generalized anxiety disorder) 02/02/2022   Other specified depressive episodes 02/02/2022   Long-term current use of benzodiazepine 02/02/2022   Migraine with aura and without status migrainosus, not intractable 11/03/2020   Arrhythmia 08/06/2020   Status migrainosus 02/25/2019   Osteoporosis 04/09/2013   Vitamin D deficiency 04/09/2013   DDD (degenerative disc  disease), lumbosacral 02/08/2005    PCP: Harvey Gaetana CROME, NP   REFERRING PROVIDER: Meeler, Benton CROME, FNP   REFERRING DIAG: M54.16 (ICD-10-CM) - Lumbar radiculitis   Rationale for Evaluation and Treatment: Rehabilitation  THERAPY DIAG:  Muscle weakness (generalized)  Other low back pain  Pain in thoracic spine  ONSET DATE: 1.5 months   SUBJECTIVE:                                                                                                                                                                                           SUBJECTIVE STATEMENT: Pt reports improved pain in her back after last session, particularly on the left side. Reports HEP went well without issue.   From Eval: Pt here to work on pain and discomfort in her mid back. Pt reports the more she is on her feet the more her back hurts and relief comes from being supine. Pt has not had any previous therapy for her back pain. Pt has epidural that assists with her pain below her belt line. Pt still in OT working on her upper extremity and grip strength. Pt has epidural shots for last 3 years. Pt initially hurt her back lifting something to put in her car. Patient can stand for maybe 30 min at a time before pain is no longer tolerable. She reports that sitting down does not improve her pain but lying on her side in would bring relief. Pt has history of osteoporosis and sliped disc in her back. Pt pain in this area is similar to pain she gets epidural for but just further superior into her thoracic spine.    PERTINENT HISTORY:  Patient familiar to this clinic before.  Was previously seen following CVA for mobility and balance deficits.  Still seeing speech and language pathologist and occupational therapy.  PAIN:  Are you having pain? Yes: NPRS scale: 3-10 ( at end of day everyday) Pain location: above belt line and radiating laterally  Pain description: nagging, throbbing pain  Aggravating factors:  Standing Relieving factors: Lying down  PRECAUTIONS: None  RED FLAGS: None   WEIGHT BEARING RESTRICTIONS: No  FALLS:  Has patient fallen in last 6 months? No  LIVING ENVIRONMENT:  Lives with: lives with SO Lives in: House/apartment Stairs: No Has following equipment at home: None  OCCUPATION: Merchandiser, Retail   PLOF: Independent and Independent with basic ADLs  PATIENT GOALS: improve back pain   NEXT MD VISIT: Not scheduled   OBJECTIVE:   Note: Objective measures were completed at Evaluation unless otherwise noted.  DIAGNOSTIC FINDINGS:  No recent images since to 2023 were mild degenerative changes in the lumbar spine were noted  PATIENT SURVEYS:  Modified Oswestry:  MODIFIED OSWESTRY DISABILITY SCALE  Date: 04/25/24 Score  Pain intensity 5 =  Pain medication has no effect on my pain.  2. Personal care (washing, dressing, etc.) 0 =  I can take care of myself normally without causing increased pain.  3. Lifting 5 =  I cannot lift or carry anything at all.  4. Walking 3 =  Pain prevents me from walking more than  mile.  5. Sitting 3 =  Pain prevents me from sitting more than  hour.  6. Standing 2 =  Pain prevents me from standing more than 1 hour  7. Sleeping 1 = I can sleep well only by using pain medication.  8. Social Life 2 = Pain prevents me from participating in more energetic activities (eg. sports, dancing).  9. Traveling 2 =  My pain restricts my travel over 2 hours.  10. Employment/ Homemaking 3 = Pain prevents me from doing anything but light duties.  Total 26/50   Interpretation of scores: Score Category Description  0-20% Minimal Disability The patient can cope with most living activities. Usually no treatment is indicated apart from advice on lifting, sitting and exercise  21-40% Moderate Disability The patient experiences more pain and difficulty with sitting, lifting and standing. Travel and social life are more difficult and they may be disabled  from work. Personal care, sexual activity and sleeping are not grossly affected, and the patient can usually be managed by conservative means  41-60% Severe Disability Pain remains the main problem in this group, but activities of daily living are affected. These patients require a detailed investigation  61-80% Crippled Back pain impinges on all aspects of the patient's life. Positive intervention is required  81-100% Bed-bound These patients are either bed-bound or exaggerating their symptoms  Bluford FORBES Zoe DELENA Karon DELENA, et al. Surgery versus conservative management of stable thoracolumbar fracture: the PRESTO feasibility RCT. [place] ([place]): [employer]; 2021 Nov. [facility] Technology Assessment, No. 25.62.) Appendix 3, Oswestry Disability Index category descriptors. Available from: Findjewelers.cz  Minimally Clinically Important Difference (MCID) = 12.8%  COGNITION: Overall cognitive status: Within functional limits for tasks assessed     SENSATION: WFL  MUSCLE LENGTH: Hamstrings: WFL during DLLT   POSTURE: rounded shoulders  PALPATION: Tender to palpation along lumbar paraspinals, origin of gluteal muscles, and some along thoracic paraspinals.  Patient had most pain along lumbar paraspinals and gluteal musculature on the left greater than right side  LUMBAR ROM:   AROM eval  Flexion WNL  Extension Min - tight   Right lateral flexion To knee- tight  Left lateral flexion To knee - tight   Right rotation WNL- tight   Left rotation WNL tight    (Blank rows = not tested)  LOWER EXTREMITY ROM:    Further hip range of motion may be beneficial  LOWER EXTREMITY MMT:    MMT Right eval Left eval  Hip flexion 4 4  Hip extension    Hip abduction 4+ 4+  Hip adduction  5 5  Hip internal rotation    Hip external rotation    Knee flexion 5 5  Knee extension 5 5  Ankle dorsiflexion 5 5  Ankle plantarflexion    Ankle inversion    Ankle  eversion     (Blank rows = not tested) Measured in seated position   LUMBAR SPECIAL TESTS:  DLLT: could not sustain past 60 degrees  FUNCTIONAL TESTS:     TREATMENT DATE: 05/02/24   TE- To improve strength, endurance, mobility, and function of specific targeted muscle groups or improve joint range of motion or improve muscle flexibility  Nustep B UE and LE movement training with MHP on lower back throughout x 8 min L 2 - reports pain improvement with activity   Manual therapy   STM to L gluteal region near origin and Lumbar paraspinals. X 15 min   TE- To improve strength, endurance, mobility, and function of specific targeted muscle groups or improve joint range of motion or improve muscle flexibility  All 4s LE bird dog x progression UE and LE only x 5 ea then combined for 5 ea   SL clamshell on R LE 2 x 10   SL open book stretch x 12 reps   Supine hooklying:   Supine TrA contraction   x 15 - instruction to make maneuver like getting into tight dress  -added clamshell hold to exercise on second round to add glute activation with core activation and stabilization   PATIENT EDUCATION: Education details: POC Person educated: Patient Education method: Explanation Education comprehension: verbalized understanding   HOME EXERCISE PROGRAM: Access Code: [email or web address] URL: [email or web address] Date: 04/30/2024 Prepared by: Lonni Gainer  Exercises - Clamshell  - 1 x daily - 7 x weekly - 3 sets - 10 reps - Bird Dog Progression  - 1 x daily - 7 x weekly - 1 sets - 10 reps - Sidelying Thoracic Rotation with Open Book  - 1 x daily - 7 x weekly - 1 sets - 10 reps - 5 sec hold   ASSESSMENT:  CLINICAL IMPRESSION:  Patient arrived with good motivation for completion of pt activities.  Pt responds well to nustep with heat reporting improved pain levels in back following. Pt had significant trigger points in her gluteal musculature this date and reported  improvement from IASTM this date and subsequent therapeutic exercise to address muscles targeted with IATSM. Pt responding well to interventions and may progress next session if pt continues to have improved pain levels. Pt will continue to benefit from skilled physical therapy intervention to address impairments, improve QOL, and attain therapy goals.   OBJECTIVE IMPAIRMENTS: Abnormal gait.   ACTIVITY LIMITATIONS: carrying, standing, squatting, and locomotion level  PARTICIPATION LIMITATIONS: meal prep, cleaning, shopping, community activity, and occupation  PERSONAL FACTORS: Past/current experiences, Time since onset of injury/illness/exacerbation, and 3+ comorbidities: HTN, HLD, osteoporosis, B12 deficiency are also affecting patient's functional outcome.   REHAB POTENTIAL: Good  CLINICAL DECISION MAKING: Stable/uncomplicated  EVALUATION COMPLEXITY: Low   GOALS: Goals reviewed with patient? Yes  SHORT TERM GOALS: Target date: 05/23/2024       Patient will be independent in home exercise program to improve strength/mobility for better functional independence with ADLs. Baseline: No HEP currently  Goal status: INITIAL   LONG TERM GOALS: Target date: 06/19/2024    Patient will improve modified Oswestry pain disability questionnaire by 10 points or greater in order to indicate improvement in her subjective level of back pain as well as her ability  to complete functional tasks without pain limitations Baseline: 26 Goal status: INITIAL  2.  Patient will improve double leg limb lowering test from 60 degrees to 30 degrees or greater showing good core control throughout in order to indicate improved strength of anterior core muscles for improved lumbar stability Baseline: 60 degrees patient loses control and has some discomfort Goal status: INITIAL  3.  Patient will report ability to stand for greater than 1 hour without limitations of pain over to allow her to return to  work-related tasks as well as shopping related tasks with less limitation Baseline: When patient stands for 30 minutes per her report she starts to get intense pain that is only relieved by lying down Goal status: INITIAL  4.  Patient report ability to lift light objects from the floor and set them on an object such as a table in order without increase in back pain to show improved functional capacity as well as improved ability to return to work-related tasks Baseline: Patient reports she cannot lift or carry anything at all  Goal status: INITIAL    PLAN:  PT FREQUENCY: 2x/week  PT DURATION: 8 weeks  PLANNED INTERVENTIONS: 97750- Physical Performance Testing, 97110-Therapeutic exercises, 97530- Therapeutic activity, [id number]- Neuromuscular re-education, 97535- Self Care, [id number]- Manual therapy, [id number]- Gait training, [phone] (1-2 muscles), [id number] (3+ muscles)- Dry Needling, Patient/Family education, Balance training, Stair training, and Moist heat.  PLAN FOR NEXT SESSION: hip assessment, STM or IASTM to paraspinals, QL and/ or gluteal origin Standing endurance - Standing side plank and bird dog on wall    Lonni KATHEE Gainer, PT 05/02/2024, 1:30 PM

## 2024-05-02 NOTE — Therapy (Signed)
 Occupational Therapy Neuro Treatment Note   Patient Name: Ashley Collins MRN: [id number] DOB:[birth year], 63 y.o., female Today's Date: 05/02/2024  PCP: Harvey Gaetana CROME, NP REFERRING PROVIDER: Harvey Gaetana CROME, NP   OT End of Session - 05/02/24 1450     Visit Number 37    Number of Visits 48    Date for Recertification  05/29/24    OT Start Time 1445    OT Stop Time 1530    OT Time Calculation (min) 45 min    Activity Tolerance Patient tolerated treatment well    Behavior During Therapy WFL for tasks assessed/performed               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   Cardiac arrhythmia    Nonspecific ST T wave changes on EKG   Chronic venous insufficiency of lower extremity    Complication of anesthesia    nausea and vomiting   DDD (degenerative disc disease), lumbosacral    Essential hypertension    Headache    History of kidney stones    Hyperlipidemia    Kidney stones    Lymphedema    Migraines    Osteoporosis    PONV (postoperative nausea and vomiting)    Right ureteral stone    Vitamin B12 deficiency    Vitamin D deficiency    Past Surgical History:  Procedure Laterality Date   AUGMENTATION MAMMAPLASTY     CESAREAN SECTION     x 4   COLONOSCOPY WITH PROPOFOL  N/A 03/31/2022   Procedure: COLONOSCOPY WITH PROPOFOL ;  Surgeon: Onita Elspeth Sharper, DO;  Location: [facility] ENDOSCOPY;  Service: Gastroenterology;  Laterality: N/A;   CYSTOSCOPY W/ RETROGRADES Bilateral 07/10/2020   Procedure: CYSTOSCOPY WITH RETROGRADE PYELOGRAM;  Surgeon: Francisca Redell BROCKS, MD;  Location: [facility] ORS;  Service: Urology;  Laterality: Bilateral;   CYSTOSCOPY/URETEROSCOPY/HOLMIUM LASER/STENT PLACEMENT     CYSTOSCOPY/URETEROSCOPY/HOLMIUM LASER/STENT PLACEMENT Bilateral 07/10/2020   Procedure: CYSTOSCOPY/URETEROSCOPY/HOLMIUM LASER/STENT PLACEMENT;  Surgeon: Francisca Redell BROCKS, MD;  Location: [facility] ORS;  Service:  Urology;  Laterality: Bilateral;   CYSTOSCOPY/URETEROSCOPY/HOLMIUM LASER/STENT PLACEMENT Right 08/25/2023   Procedure: CYSTOSCOPY/URETEROSCOPY/HOLMIUM LASER;  Surgeon: Francisca Redell BROCKS, MD;  Location: [facility] ORS;  Service: Urology;  Laterality: Right;   CYSTOSCOPY/URETEROSCOPY/HOLMIUM LASER/STENT PLACEMENT Right 12/15/2023   Procedure: CYSTOSCOPY/URETEROSCOPY/HOLMIUM LASER;  Surgeon: Francisca Redell BROCKS, MD;  Location: [facility] ORS;  Service: Urology;  Laterality: Right;   EXTRACORPOREAL SHOCK WAVE LITHOTRIPSY     x 10 plus   Eye Lift     FACIAL COSMETIC SURGERY     GANGLION CYST EXCISION Right 12/21/2021   Procedure: REMOVAL GANGLION OF WRIST;  Surgeon: Kathlynn Sharper, MD;  Location: [facility] ORS;  Service: Orthopedics;  Laterality: Right;   LIPOSUCTION     TONSILLECTOMY     TRIGGER FINGER RELEASE Right 12/21/2021   Procedure: RELEASE TRIGGER FINGER/A-1 PULLEY;  Surgeon: Kathlynn Sharper, MD;  Location: [facility] ORS;  Service: Orthopedics;  Laterality: Right;   URETEROSCOPY WITH HOLMIUM LASER LITHOTRIPSY     Patient Active Problem List   Diagnosis Date Noted   Acute CVA (cerebrovascular accident) (HCC) 12/19/2023   Hypokalemia 12/19/2023   AKI (acute kidney injury) 12/19/2023   Leukocytosis 12/19/2023   Hyperlipidemia, unspecified 12/19/2023   Essential hypertension 12/19/2023   Vision changes 12/19/2023   Lymphedema 05/22/2023   Chronic venous insufficiency 05/22/2023   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 replacement therapy 04/10/2023  Insomnia due to medical condition 04/25/2022   GAD (generalized anxiety disorder) 02/02/2022   Other specified depressive episodes 02/02/2022   Long-term current use of benzodiazepine 02/02/2022   Migraine with aura and without status migrainosus, not intractable 11/03/2020   Arrhythmia 08/06/2020   Status migrainosus 02/25/2019   Osteoporosis 04/09/2013   Vitamin D deficiency 04/09/2013   DDD (degenerative disc disease), lumbosacral 02/08/2005   ONSET DATE:  12/18/2023  REFERRING DIAG:   THERAPY DIAG:  Muscle weakness (generalized)  Other lack of coordination  Low vision, both eyes  Rationale for Evaluation and Treatment: Rehabilitation  SUBJECTIVE:  SUBJECTIVE STATEMENT:   Pt.  reports that she plans to return to work at reduced hours in January after the holiday season winds down.  Pt accompanied by: significant other  PERTINENT HISTORY: Pt. has Hx of stroke with onset 12/18/2023. Pt. PMHx includes: HTN, Hyperlipidemia, Kidney Stones, near syncopal event, loss of vision.  PRECAUTIONS: None  WEIGHT BEARING RESTRICTIONS: None  PAIN:  Are you having pain? No  FALLS: Has patient fallen in last 6 months? Yes. Number of falls    LIVING ENVIRONMENT: Lives with: lives with their family Lives in: [place], [place] Stairs: No, inside the house, yes but does not use Has following equipment at home:   PLOF: Independent  PATIENT GOALS: To be able to see  OBJECTIVE:   HAND DOMINANCE: Right  ADLs:  Eating: Drinking from straw is different, able to use utensils.  Grooming: Fatigues completing hair care.  UB Dressing: independent, if clothes are in front of her she can find clothing LB Dressing: Independent Toileting: Independent Bathing: Independent Tub Shower transfers: Independent Equipment: none Has difficulty with using cell phone  IADLs: Shopping: Does not typically go out shopping, and has not tried to. Light housekeeping: Pt. reports that she does not typically do house cleaning. Pt. has been able to unpack belongings from boxes due to her recent move in. Meal Prep: Pt. reports that is does not currently cook, however reports that she probably could. Has difficulty opening bottle caps/lids. Community mobility: Requires assistance from husband to navigate  through buildings, negotiate stairs-Pt. with increased fear of falling  Medication management: Able to push down medication bottles to open  Financial management: No  change in the process-uses automatic bill pay system.  Has difficulty with using cell phone Handwriting: N/T Work: Pt. was actively working managing a health visitor, works a lot of hours  MOBILITY STATUS: Independent and Needs Assist: Requires hand on hand assistance with nvigating through environments.   POSTURE COMMENTS:  No Significant postural limitations Sitting balance: Good  ACTIVITY TOLERANCE: Activity tolerance: Good  FUNCTIONAL OUTCOME MEASURES:   UPPER EXTREMITY ROM:    Active ROM Right Eval WFL Left Eval [facility]  Shoulder flexion    Shoulder abduction    Shoulder adduction    Shoulder extension    Shoulder internal rotation    Shoulder external rotation    Elbow flexion    Elbow extension    Wrist flexion    Wrist extension    Wrist ulnar deviation    Wrist radial deviation    Wrist pronation    Wrist supination    (Blank rows = not tested)  UPPER EXTREMITY MMT:     MMT Right eval Right 01/29/24 Right  04/04/24 Left eval Left 01/29/24 Left 04/04/24  Shoulder flexion 4-/5 5/5 5/5 4-/5 5/5 5/5  Shoulder abduction 4-/5 5/5 5/5 4-/5 5/5 5/5  Shoulder adduction  Shoulder extension        Shoulder internal rotation        Shoulder external rotation        Middle trapezius        Lower trapezius        Elbow flexion 5/5 5/5 5/5 5/5 5/5 5/5  Elbow extension 5/5 5/5 5/5 5/5 5/5 5/5  Wrist flexion 4/5 5/5 5/5 4-/5 5/5 5/5  Wrist extension 4/5 5/5 5/5 4-/5 5/5 5/5  Wrist ulnar deviation        Wrist radial deviation        Wrist pronation        Wrist supination        (Blank rows = not tested)  HAND FUNCTION: Grip strength: Right: 39 lbs; Left: 28 lbs, Lateral pinch: Right: 11 lbs, Left: 9 lbs, and 3 point pinch: Right: 9 lbs, Left: 9 lbs  01/29/24 Grip strength: Right: 39 lbs; Left: 36 lbs, Lateral pinch: Right: 14 lbs, Left: 12 lbs, and 3 point pinch: Right: 12 lbs, Left: 11 lbs  04/04/24:  Grip strength: Right: 37 lbs; Left: 42  lbs    COORDINATION: 9 Hole Peg test: Right: 30 sec; Left: 36 sec  01/29/24 9 Hole Peg test: Right: 24 sec; Left: 23 sec.  04/04/24:  9 Hole Peg test: Right: 28 sec; Left: 33 sec.  SENSATION: WFL  EDEMA:   MUSCLE TONE:   COGNITION: Overall cognitive status: Within functional limits for tasks assessed  VISION: Subjective report: Pt. reports changes in her vision at onset of stroke, starting with blurry vision, resulting in vision loss in the R eye.  Baseline vision: Pt. Is able to visually track  Visual history: Hx of eye surgery  VISION ASSESSMENT: TBD  PERCEPTION: WFL  PRAXIS: WFL  OBSERVATIONS:                                                                                                                    TREATMENT DATE: 05/02/24   Therapeutic Exercises:  -Red rubberband provided for thumb palmar and radial abduction 2 set x 10 reps -Green theraputty HEP reviewed - 1 set x 10 reps each to progress  strengthening, coordination and sensory stimulation of R hand.  Patient provided visual demonstration, verbal and tactile cues as needed to improve performance of the various exercises/activities including: - Putty Rolls - encourage to roll putty into logs   - Pinch and Pull with Putty -  - Finger Adduction with Putty  - Removing Objects from Putty    Therapeutic activities: -Pt worked on in scientist, physiological grasping 6-8 scrabble tiles from container and placing them one at a time on table using palm to finger translation. -Pt. worked on runner, broadcasting/film/video 3-5 letter words from scrabble tiles then attempting to recall list. 1st attempt pt created 5 words, free recalled 0/5, recalled 3/5 with visual cues. Discussed memory strategies with pt stating creating story/association working best. 2nd attempt pt create 3 words and free recalled  3/3 words with use of a story to recall.    HOME EXERCISE PROGRAM:  -Green Theraputty exercises for  hand strengthening  at bridge -Visual perceptual: Flip flop target design patterns. -Visual memory tasks -Writing tasks -visual motor tasks.  GOALS: Goals reviewed with patient? Yes  SHORT TERM GOALS: Target date: 04/17/2024    Pt. Will independently utilize HEP for hand strength, coordination, and visual compensatory strategies ADL/ADLs Baseline: 04/12/23: Independent, continue 03/06/24: Independent Eval: No  current HEP  Goal status: Ongoing   LONG TERM GOALS: Target date: 05/29/2024   Pt. Will be able to independently implement visual scanning/visual search compensatory strategies for tasks within her extra personal space navigating through community environments 100% of the time.  Baseline: 01/29/24:  Independent  100% of the time within the therapy gym, and hallway. Eval: Pt. Requires increased assist to navigate through community environments. Goal status: Achieved  2.  Pt. Will be able to independently utilize visual scanning/visual search strategies  during ADLs/IADLs within her near space, and during tabletop tasks, and tasks at a vertical plane with 100% accuracy. Baseline: 04/11/24: 80% simple to moderately complex visual scanning tasks however 70% accuracy for complex visual scanning tasks, and scanning at a vertical plane. 03/06/24: Initiates visual scanning 85% of the time  01/29/24: 75% Eval: Pt. Education to be provided.  Goal status: revised-04/11/24  3.  Pt. Will be able to independently initiate visual scanning techniques in her own environment to reduce risk of falls.  Baseline: 01/29/24: Education was provided, Pt. is utlizing visual compensatory strategies/visual scanning within her home. Eval: Pt. Education to be provided.  Goal status: Achieved  4.  Pt. Will increase BUE Grip Strength by 5# to be able to independently and securely hold objects for ADL/IADL use. Baseline:  04/11/24: Continue- recent revision 04/04/2024: Grip strength: Right: 37 lbs; Left: 42 lbs patient is dropping items  from her hands 01/29/24: Grip strength: Right: 39 lbs; Left: 28 lbs, Eval: Right Grip: 39#, Left Grip: 28# Goal status: Ongoing-recent revision  5.  Pt. Will increase L Lateral Pinch strength by 2# to be able to open bottles. Baseline: 04/04/2024: Grip strength: Right: 37 lbs; Left: 42 lbs 01/29/24: Lateral pinch: Right: 14 lbs, Left: 12 lbs Eval: Lateral pinch: Right: 11 lbs, Left: 9 lbs  Goal status: Achieved  6.  Pt. Will increase BUE strength for shoulder flexion/abduction by 2 mm grades to independently complete hair care tasks. Baseline: 03/16/2024: 5/5 01/29/24: 5/5 overall Eval: Right Shoulder Flexion: 4-/5, Left Shoulder Flexion:4-/5, Right Shoulder Abduction: 4-/5, Left Shoulder Abduction: 4-/5 Goal status: Achieved  7.  Pt will be able to independently and efficiently manipulate medication without dropping them.  Baseline: 04/11/24: Continue, recent revision 04/04/2024: 9 Hole Peg test: Right: 28 sec; Left: 33 sec. 01/29/24: Independent 01/15/24: Daughter currently manages medication set up.  Goal status: Ongoing, recent revision  8. Pt will increase typing speed to 20-30 words per minute with at least 90% accuracy to work towards more efficient typing for job related  responsibilities.  Baseline: 04/11/24: TBD 03/06/24: 10 wpm with 92% accuracy. 01/29/24: Continue 01/15/24: 8wpm x88% accuracy=7 wpm  Goal status: Ongoing  9. Pt will be able to scan small print on store receipts to check for errors with 100% accuracy using visual compensation strategies as needed.  Baseline:04/11/24: Pt. Continues to present with difficulty with visual scanning small  small items efficiently. 03/06/24: Pt. Continues to present with difficulty with visual scanning small  small items efficiently. 01/29/24: Pt. Continues to  have difficulty scanning small print items. 01/15/24: Not yet attempted; required for return to work/job responsibility  Goal status:  Ongoing  10. Pt. will independently identify 8 items  consistently from visual visual memory in preparation or ADLs.    Baseline: 04/11/24: 5-7 items  while seated at the tabletop. 03/06/24: Pt. is able to identify up to 6 picture items while seated at the tabletop. Pt. is able to consistently Identify 4 letters from visual memory when attention is divided.  01/29/24: Pt. is able to identify 6 items consistently from visual memory.    Gaol status: Ongoing      11. Pt. will be independently write 4 sentences  efficiently with 100% legibility, no deviation from writing on a blank line, and appropriate spacing between letters.              Baseline: 04/11/24: Continue 03/06/24: 4 lines with 75% legibility, positive deviation below the line, and excessive spacing between the words.              Goal status: Ongoing  ASSESSMENT:  CLINICAL IMPRESSION:  Required visual cues to complete theraputty exercises - pt defaults to gross gripping only with only verbal cues. Issued 10 small beads for use with home putty to promote pinching and FMC. Completed memory task with scrabble tiles, improved recall with reminders to use cognitive strategies. Pt. continues to benefit from Occupational Therapy services to improve her ability to use visual compensatory strategies, improve visual memory skills and improve overall BUE functioning in order to improve engagement in, and maximize overall independence with ADLs, and IADL tasks.    PERFORMANCE DEFICITS: in functional skills including ADLs, IADLs, coordination, dexterity, Fine motor control, and vision, and psychosocial skills including coping strategies, environmental adaptation, habits, interpersonal interactions, and routines and behaviors.   IMPAIRMENTS: are limiting patient from ADLs, IADLs, rest and sleep, work, leisure, and social participation.   CO-MORBIDITIES: may have co-morbidities  that affects occupational performance. Patient will benefit from skilled OT to address above impairments and improve overall  function.  MODIFICATION OR ASSISTANCE TO COMPLETE EVALUATION: Min-Moderate modification of tasks or assist with assess necessary to complete an evaluation.  OT OCCUPATIONAL PROFILE AND HISTORY: Detailed assessment: Review of records and additional review of physical, cognitive, psychosocial history related to current functional performance.  CLINICAL DECISION MAKING: Moderate - several treatment options, min-mod task modification necessary  REHAB POTENTIAL: Good  EVALUATION COMPLEXITY: Moderate    PLAN:  OT FREQUENCY: 2x/week  OT DURATION: 12 weeks  PLANNED INTERVENTIONS: 97168 OT Re-evaluation, 97535 self care/ADL training, 02889 therapeutic exercise, 97530 therapeutic activity, 97112 neuromuscular re-education, visual/perceptual remediation/compensation, patient/family education, and DME and/or AE instructions  RECOMMENDED OTHER SERVICES: ST  CONSULTED AND AGREED WITH PLAN OF CARE: Patient and family member/caregiver  PLAN FOR NEXT SESSION: Treatment  Elston Slot, M.S. OTR/L  05/02/24, 2:50 PM  ascom [phone]

## 2024-05-02 NOTE — Therapy (Signed)
 OUTPATIENT SPEECH LANGUAGE PATHOLOGY  TREATMENT / PROGRESS NOTE   Patient Name: Ashley Collins MRN: [id number] DOB:1960-10-10, 63 y.o., female Today's Date: 05/02/2024  PCP: Gaetana Haddock, NP  REFERRING PROVIDER: same  Speech Therapy Progress Note  Dates of Reporting Period: 04/01/24 to 05/02/24  Objective: Patient has been seen for 10 speech therapy sessions this reporting period targeting cognitive-communication. Patient is making progress toward LTGs and met all STGs this reporting period. See skilled intervention, clinical impressions, and goals below for details.    End of Session - 05/02/24 1358     Visit Number 20    Number of Visits 24    Date for Recertification  05/14/24    SLP Start Time 1400    SLP Stop Time  1445    SLP Time Calculation (min) 45 min    Activity Tolerance Patient tolerated treatment well           Patient Active Problem List   Diagnosis Date Noted   Acute CVA (cerebrovascular accident) (HCC) 12/19/2023   Hypokalemia 12/19/2023   AKI (acute kidney injury) 12/19/2023   Leukocytosis 12/19/2023   Hyperlipidemia, unspecified 12/19/2023   Essential hypertension 12/19/2023   Vision changes 12/19/2023   Lymphedema 05/22/2023   Chronic venous insufficiency 05/22/2023   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 replacement therapy 04/10/2023   Insomnia due to medical condition 04/25/2022   GAD (generalized anxiety disorder) 02/02/2022   Other specified depressive episodes 02/02/2022   Long-term current use of benzodiazepine 02/02/2022   Migraine with aura and without status migrainosus, not intractable 11/03/2020   Arrhythmia 08/06/2020   Status migrainosus 02/25/2019   Osteoporosis 04/09/2013   Vitamin D deficiency 04/09/2013   DDD (degenerative disc disease), lumbosacral 02/08/2005    ONSET DATE: 12/19/23   REFERRING DIAG: CVA- memory deficits  THERAPY DIAG:  Cognitive communication deficit  Rationale for Evaluation and Treatment  Rehabilitation  SUBJECTIVE:   SUBJECTIVE STATEMENT: Pt alert, pleasant, and cooperative. Pt accompanied by: self and significant other  PERTINENT HISTORY & DIAGNOSTIC FINDINGS: Pt is 63 y.o. female who presents today for a cognitive-communication evaluation in setting of stroke. MRI 12/18/23 1. 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 No associated hemorrhage or mass effect. PMHx as outlined above.  PAIN:  Are you having pain? No   FALLS: Has patient fallen in last 6 months?  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 Pt recalled >5 details from written passage from last session with extra time.   Further education provided re: strategies for improved attention/memory at work. Pt identified several strategies to implement upon return to work. Pt educated on use of notes feature for voice memos/reminder. Pt returned demonstration.   PATIENT EDUCATION: Education details: as above Person educated: Patient and Spouse Education method: Explanation Education comprehension: verbalized understanding  HOME EXERCISE PROGRAM:        Continue compensations for attention and memory      GOALS:  Goals reviewed with patient? Yes  SHORT TERM GOALS: Target date: 10 sessions  Pt will complete PROM re: memory.  Baseline: Goal status: MET   2.  Pt will endorse successful implementation of at least x2 compensations for attention and memory.  Baseline:  Goal status: MET  3.  With Moderate A, patient will establish external aid for memory/executive function and bring to more than 50% of therapy sessions.    Baseline:  Goal  status: MET    LONG TERM GOALS: Target date: 12 weeks  Pt will endorse improvement in cognitive-communication per PROM.  Baseline:  Goal status: PROGRESSING  2.  Pt and/or husband will demonstrate understanding of ways to promote and support cognitive-communication outside of SLP sessions.  Baseline:  Goal status: MET   ASSESSMENT:  CLINICAL IMPRESSION:  Pt is 63 y.o. female who presents today for a cognitive-communication treatment in setting of stroke. Initial assessment completed via formal means (Cognitive-Linguistic Quick Test) and PROM (Neuro-QoL Adult Cognitive Function v2.0). Pt presents with at least mild cognitive-communication deficits affecting attention and visuospatial skills per CLQT as well as memory and executive functioning per PROM. Suspect CLQT is not as sensitive to higher level cognitive-communication deficits appreciated by pt during iADLs. See details of today's tx as outlined above. Recommend skilled ST services targeting above mentioned deficits to improve QoL and performance on ADLs/IADLs.  OBJECTIVE IMPAIRMENTS include attention, memory, executive functioning, and visuospatial deficits. These impairments are limiting patient from return to work, managing appointments, household responsibilities, and ADLs/IADLs. Factors affecting potential to achieve goals and functional outcome are medical prognosis.. Patient will benefit from skilled SLP services to address above impairments and improve overall function.  REHAB POTENTIAL: Good  PLAN: SLP FREQUENCY: 2x/week  SLP DURATION: 12 weeks  PLANNED INTERVENTIONS: Cueing hierachy, Cognitive reorganization, Internal/external aids, Functional tasks, SLP instruction and feedback, Compensatory strategies, and Patient/family education    Delon Bangs, M.S., CCC-SLP Speech-Language Pathologist [facility] [phone] [address])  [facility] Outpatient Rehabilitation  at [facility] [address] [place], [place], [address] Phone: [phone]   Fax:  [phone]

## 2024-05-07 ENCOUNTER — Ambulatory Visit: Admitting: Occupational Therapy

## 2024-05-07 ENCOUNTER — Ambulatory Visit

## 2024-05-07 DIAGNOSIS — H543 Unqualified visual loss, both eyes: Secondary | ICD-10-CM

## 2024-05-07 DIAGNOSIS — M6281 Muscle weakness (generalized): Secondary | ICD-10-CM

## 2024-05-07 DIAGNOSIS — R41841 Cognitive communication deficit: Secondary | ICD-10-CM

## 2024-05-07 NOTE — Therapy (Signed)
 Occupational Therapy Neuro Treatment Note   Patient Name: Ashley Collins MRN: [id number] DOB:07-21-1960, 63 y.o., female Today's Date: 05/07/2024  PCP: Harvey Gaetana CROME, NP REFERRING PROVIDER: Harvey Gaetana CROME, NP   OT End of Session - 05/07/24 1106     Visit Number 38    Number of Visits 48    Date for Recertification  05/29/24    OT Start Time 1100    OT Stop Time 1145    OT Time Calculation (min) 45 min    Activity Tolerance Patient tolerated treatment well    Behavior During Therapy WFL for tasks assessed/performed               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   Cardiac arrhythmia    Nonspecific ST T wave changes on EKG   Chronic venous insufficiency of lower extremity    Complication of anesthesia    nausea and vomiting   DDD (degenerative disc disease), lumbosacral    Essential hypertension    Headache    History of kidney stones    Hyperlipidemia    Kidney stones    Lymphedema    Migraines    Osteoporosis    PONV (postoperative nausea and vomiting)    Right ureteral stone    Vitamin B12 deficiency    Vitamin D deficiency    Past Surgical History:  Procedure Laterality Date   AUGMENTATION MAMMAPLASTY     CESAREAN SECTION     x 4   COLONOSCOPY WITH PROPOFOL  N/A 03/31/2022   Procedure: COLONOSCOPY WITH PROPOFOL ;  Surgeon: Onita Elspeth Sharper, DO;  Location: [facility] ENDOSCOPY;  Service: Gastroenterology;  Laterality: N/A;   CYSTOSCOPY W/ RETROGRADES Bilateral 07/10/2020   Procedure: CYSTOSCOPY WITH RETROGRADE PYELOGRAM;  Surgeon: Francisca Redell BROCKS, MD;  Location: [facility] ORS;  Service: Urology;  Laterality: Bilateral;   CYSTOSCOPY/URETEROSCOPY/HOLMIUM LASER/STENT PLACEMENT     CYSTOSCOPY/URETEROSCOPY/HOLMIUM LASER/STENT PLACEMENT Bilateral 07/10/2020   Procedure: CYSTOSCOPY/URETEROSCOPY/HOLMIUM LASER/STENT PLACEMENT;  Surgeon: Francisca Redell BROCKS, MD;  Location: [facility] ORS;  Service:  Urology;  Laterality: Bilateral;   CYSTOSCOPY/URETEROSCOPY/HOLMIUM LASER/STENT PLACEMENT Right 08/25/2023   Procedure: CYSTOSCOPY/URETEROSCOPY/HOLMIUM LASER;  Surgeon: Francisca Redell BROCKS, MD;  Location: [facility] ORS;  Service: Urology;  Laterality: Right;   CYSTOSCOPY/URETEROSCOPY/HOLMIUM LASER/STENT PLACEMENT Right 12/15/2023   Procedure: CYSTOSCOPY/URETEROSCOPY/HOLMIUM LASER;  Surgeon: Francisca Redell BROCKS, MD;  Location: [facility] ORS;  Service: Urology;  Laterality: Right;   EXTRACORPOREAL SHOCK WAVE LITHOTRIPSY     x 10 plus   Eye Lift     FACIAL COSMETIC SURGERY     GANGLION CYST EXCISION Right 12/21/2021   Procedure: REMOVAL GANGLION OF WRIST;  Surgeon: Kathlynn Sharper, MD;  Location: [facility] ORS;  Service: Orthopedics;  Laterality: Right;   LIPOSUCTION     TONSILLECTOMY     TRIGGER FINGER RELEASE Right 12/21/2021   Procedure: RELEASE TRIGGER FINGER/A-1 PULLEY;  Surgeon: Kathlynn Sharper, MD;  Location: [facility] ORS;  Service: Orthopedics;  Laterality: Right;   URETEROSCOPY WITH HOLMIUM LASER LITHOTRIPSY     Patient Active Problem List   Diagnosis Date Noted   Acute CVA (cerebrovascular accident) (HCC) 12/19/2023   Hypokalemia 12/19/2023   AKI (acute kidney injury) 12/19/2023   Leukocytosis 12/19/2023   Hyperlipidemia, unspecified 12/19/2023   Essential hypertension 12/19/2023   Vision changes 12/19/2023   Lymphedema 05/22/2023   Chronic venous insufficiency 05/22/2023   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 replacement therapy 04/10/2023  Insomnia due to medical condition 04/25/2022   GAD (generalized anxiety disorder) 02/02/2022   Other specified depressive episodes 02/02/2022   Long-term current use of benzodiazepine 02/02/2022   Migraine with aura and without status migrainosus, not intractable 11/03/2020   Arrhythmia 08/06/2020   Status migrainosus 02/25/2019   Osteoporosis 04/09/2013   Vitamin D deficiency 04/09/2013   DDD (degenerative disc disease), lumbosacral 02/08/2005   ONSET DATE:  12/18/2023  REFERRING DIAG:   THERAPY DIAG:  Muscle weakness (generalized)  Low vision, both eyes  Rationale for Evaluation and Treatment: Rehabilitation  SUBJECTIVE:  SUBJECTIVE STATEMENT:   Pt. reports that her husband has been scheduled with PT at the same time as her POT session.  Pt accompanied by: significant other  PERTINENT HISTORY: Pt. has Hx of stroke with onset 12/18/2023. Pt. PMHx includes: HTN, Hyperlipidemia, Kidney Stones, near syncopal event, loss of vision.  PRECAUTIONS: None  WEIGHT BEARING RESTRICTIONS: None  PAIN:  Are you having pain? No  FALLS: Has patient fallen in last 6 months? Yes. Number of falls    LIVING ENVIRONMENT: Lives with: lives with their family Lives in: [place], [place] Stairs: No, inside the house, yes but does not use Has following equipment at home:   PLOF: Independent  PATIENT GOALS: To be able to see  OBJECTIVE:   HAND DOMINANCE: Right  ADLs:  Eating: Drinking from straw is different, able to use utensils.  Grooming: Fatigues completing hair care.  UB Dressing: independent, if clothes are in front of her she can find clothing LB Dressing: Independent Toileting: Independent Bathing: Independent Tub Shower transfers: Independent Equipment: none Has difficulty with using cell phone  IADLs: Shopping: Does not typically go out shopping, and has not tried to. Light housekeeping: Pt. reports that she does not typically do house cleaning. Pt. has been able to unpack belongings from boxes due to her recent move in. Meal Prep: Pt. reports that is does not currently cook, however reports that she probably could. Has difficulty opening bottle caps/lids. Community mobility: Requires assistance from husband to navigate  through buildings, negotiate stairs-Pt. with increased fear of falling  Medication management: Able to push down medication bottles to open  Financial management: No change in the process-uses automatic bill pay  system.  Has difficulty with using cell phone Handwriting: N/T Work: Pt. was actively working managing a health visitor, works a lot of hours  MOBILITY STATUS: Independent and Needs Assist: Requires hand on hand assistance with nvigating through environments.   POSTURE COMMENTS:  No Significant postural limitations Sitting balance: Good  ACTIVITY TOLERANCE: Activity tolerance: Good  FUNCTIONAL OUTCOME MEASURES:   UPPER EXTREMITY ROM:    Active ROM Right Eval WFL Left Eval [facility]  Shoulder flexion    Shoulder abduction    Shoulder adduction    Shoulder extension    Shoulder internal rotation    Shoulder external rotation    Elbow flexion    Elbow extension    Wrist flexion    Wrist extension    Wrist ulnar deviation    Wrist radial deviation    Wrist pronation    Wrist supination    (Blank rows = not tested)  UPPER EXTREMITY MMT:     MMT Right eval Right 01/29/24 Right  04/04/24 Left eval Left 01/29/24 Left 04/04/24  Shoulder flexion 4-/5 5/5 5/5 4-/5 5/5 5/5  Shoulder abduction 4-/5 5/5 5/5 4-/5 5/5 5/5  Shoulder adduction        Shoulder extension  Shoulder internal rotation        Shoulder external rotation        Middle trapezius        Lower trapezius        Elbow flexion 5/5 5/5 5/5 5/5 5/5 5/5  Elbow extension 5/5 5/5 5/5 5/5 5/5 5/5  Wrist flexion 4/5 5/5 5/5 4-/5 5/5 5/5  Wrist extension 4/5 5/5 5/5 4-/5 5/5 5/5  Wrist ulnar deviation        Wrist radial deviation        Wrist pronation        Wrist supination        (Blank rows = not tested)  HAND FUNCTION: Grip strength: Right: 39 lbs; Left: 28 lbs, Lateral pinch: Right: 11 lbs, Left: 9 lbs, and 3 point pinch: Right: 9 lbs, Left: 9 lbs  01/29/24 Grip strength: Right: 39 lbs; Left: 36 lbs, Lateral pinch: Right: 14 lbs, Left: 12 lbs, and 3 point pinch: Right: 12 lbs, Left: 11 lbs  04/04/24:  Grip strength: Right: 37 lbs; Left: 42 lbs    COORDINATION: 9 Hole Peg test: Right: 30  sec; Left: 36 sec  01/29/24 9 Hole Peg test: Right: 24 sec; Left: 23 sec.  04/04/24:  9 Hole Peg test: Right: 28 sec; Left: 33 sec.  SENSATION: WFL  EDEMA:   MUSCLE TONE:   COGNITION: Overall cognitive status: Within functional limits for tasks assessed  VISION: Subjective report: Pt. reports changes in her vision at onset of stroke, starting with blurry vision, resulting in vision loss in the R eye.  Baseline vision: Pt. Is able to visually track  Visual history: Hx of eye surgery  VISION ASSESSMENT: TBD  PERCEPTION: WFL  PRAXIS: WFL  OBSERVATIONS:                                                                                                                    TREATMENT DATE: 05/07/24   Therapeutic activities:   -Facilitated right hand progressive right gross grip strengthening using a 23.4# gross gripper, and sustaining grip while reaching up through multiple planes to discard the pegs into container- 2 trials were completed. -Incorporated a reaching component through multiple planes in combination with sustaining gross grip. -Facilitated Lateral, and 3pt. pinch strengthening using yellow, red, green, and blue level resistive clips.  -Facilitated translatory movements moving clips from the lateral pinch position to the 3pt. pinch position in preparation for securely placing them on the dowel to promote hand function skills. -Facilitated right hand FMC tasks using the Grooved pegboard, grasping 1 grooved pegs from a horizontal position in the shallow dish, and transitioning them to a vertical position in preparation for placing them upright into the pegboard.  -Patient worked on removing the grooved pegs alternating thumb opposition to the 2nd through 5th digit every 4 -Patient worked with  the board positioned flat at the tabletop surface followed by an elevated vertical plane to further challenge [facility] skills -Pt worked on in writer  a hand full of 1/2  scrabble tiles from container, worked on moving them through her right hand to sort them, and placing them one at a time on table using palm to finger translation. -Pt. worked on runner, broadcasting/film/video 3-5 letter words from scrabble tiles then attempting to recall list Pt. Was unable to recall the 3 words immediately after completing them. Pt. was unable to recall the 3 words even after verbal cues.   PATIENT EDUCATION:  Pt. Education was provided about condition management; and  stroke recovery care.  HOME EXERCISE PROGRAM:  -Green Theraputty exercises for  hand strengthening at bridge -Visual perceptual: Flip flop target design patterns. -Visual memory tasks -Writing tasks -visual motor tasks.  GOALS: Goals reviewed with patient? Yes  SHORT TERM GOALS: Target date: 04/17/2024    Pt. Will independently utilize HEP for hand strength, coordination, and visual compensatory strategies ADL/ADLs Baseline: 04/12/23: Independent, continue 03/06/24: Independent Eval: No  current HEP  Goal status: Ongoing   LONG TERM GOALS: Target date: 05/29/2024   Pt. Will be able to independently implement visual scanning/visual search compensatory strategies for tasks within her extra personal space navigating through community environments 100% of the time.  Baseline: 01/29/24:  Independent  100% of the time within the therapy gym, and hallway. Eval: Pt. Requires increased assist to navigate through community environments. Goal status: Achieved  2.  Pt. Will be able to independently utilize visual scanning/visual search strategies  during ADLs/IADLs within her near space, and during tabletop tasks, and tasks at a vertical plane with 100% accuracy. Baseline: 04/11/24: 80% simple to moderately complex visual scanning tasks however 70% accuracy for complex visual scanning tasks, and scanning at a vertical plane. 03/06/24: Initiates visual scanning 85% of the time  01/29/24: 75% Eval: Pt. Education to be  provided.  Goal status: revised-04/11/24  3.  Pt. Will be able to independently initiate visual scanning techniques in her own environment to reduce risk of falls.  Baseline: 01/29/24: Education was provided, Pt. is utlizing visual compensatory strategies/visual scanning within her home. Eval: Pt. Education to be provided.  Goal status: Achieved  4.  Pt. Will increase BUE Grip Strength by 5# to be able to independently and securely hold objects for ADL/IADL use. Baseline:  04/11/24: Continue- recent revision 04/04/2024: Grip strength: Right: 37 lbs; Left: 42 lbs patient is dropping items from her hands 01/29/24: Grip strength: Right: 39 lbs; Left: 28 lbs, Eval: Right Grip: 39#, Left Grip: 28# Goal status: Ongoing-recent revision  5.  Pt. Will increase L Lateral Pinch strength by 2# to be able to open bottles. Baseline: 04/04/2024: Grip strength: Right: 37 lbs; Left: 42 lbs 01/29/24: Lateral pinch: Right: 14 lbs, Left: 12 lbs Eval: Lateral pinch: Right: 11 lbs, Left: 9 lbs  Goal status: Achieved  6.  Pt. Will increase BUE strength for shoulder flexion/abduction by 2 mm grades to independently complete hair care tasks. Baseline: 03/16/2024: 5/5 01/29/24: 5/5 overall Eval: Right Shoulder Flexion: 4-/5, Left Shoulder Flexion:4-/5, Right Shoulder Abduction: 4-/5, Left Shoulder Abduction: 4-/5 Goal status: Achieved  7.  Pt will be able to independently and efficiently manipulate medication without dropping them.  Baseline: 04/11/24: Continue, recent revision 04/04/2024: 9 Hole Peg test: Right: 28 sec; Left: 33 sec. 01/29/24: Independent 01/15/24: Daughter currently manages medication set up.  Goal status: Ongoing, recent revision  8. Pt will increase typing speed to 20-30 words per minute with at least 90% accuracy to work towards more efficient typing for job related  responsibilities.  Baseline: 04/11/24: TBD 03/06/24: 10 wpm with 92% accuracy. 01/29/24: Continue 01/15/24: 8wpm x88% accuracy=7 wpm  Goal  status: Ongoing  9. Pt will be able to scan small print on store receipts to check for errors with 100% accuracy using visual compensation strategies as needed.  Baseline:04/11/24: Pt. Continues to present with difficulty with visual scanning small  small items efficiently. 03/06/24: Pt. Continues to present with difficulty with visual scanning small  small items efficiently. 01/29/24: Pt. Continues to have difficulty scanning small print items. 01/15/24: Not yet attempted; required for return to work/job responsibility  Goal status:  Ongoing  10. Pt. will independently identify 8 items consistently from visual visual memory in preparation or ADLs.    Baseline: 04/11/24: 5-7 items  while seated at the tabletop. 03/06/24: Pt. is able to identify up to 6 picture items while seated at the tabletop. Pt. is able to consistently Identify 4 letters from visual memory when attention is divided.  01/29/24: Pt. is able to identify 6 items consistently from visual memory.    Gaol status: Ongoing      11. Pt. will be independently write 4 sentences  efficiently with 100% legibility, no deviation from writing on a blank line, and appropriate spacing between letters.              Baseline: 04/11/24: Continue 03/06/24: 4 lines with 75% legibility, positive deviation below the line, and excessive spacing between the words.              Goal status: Ongoing  ASSESSMENT:  CLINICAL IMPRESSION:  Pt. was able to perform translatory movements moving, and progressing 1/2 square tiles through her right hand in preparation for placing them onto the table.  Pt. presented with difficulty with recalling 3 words from visual memory directly after creating them. Pt. Is progressing with right grip, and pinch strengthening tasks. Pt. continues to benefit from Occupational Therapy services to improve her ability to use visual compensatory strategies, improve visual memory skills and improve overall BUE functioning in order to improve  engagement in, and maximize overall independence with ADLs, and IADL tasks.    PERFORMANCE DEFICITS: in functional skills including ADLs, IADLs, coordination, dexterity, Fine motor control, and vision, and psychosocial skills including coping strategies, environmental adaptation, habits, interpersonal interactions, and routines and behaviors.   IMPAIRMENTS: are limiting patient from ADLs, IADLs, rest and sleep, work, leisure, and social participation.   CO-MORBIDITIES: may have co-morbidities  that affects occupational performance. Patient will benefit from skilled OT to address above impairments and improve overall function.  MODIFICATION OR ASSISTANCE TO COMPLETE EVALUATION: Min-Moderate modification of tasks or assist with assess necessary to complete an evaluation.  OT OCCUPATIONAL PROFILE AND HISTORY: Detailed assessment: Review of records and additional review of physical, cognitive, psychosocial history related to current functional performance.  CLINICAL DECISION MAKING: Moderate - several treatment options, min-mod task modification necessary  REHAB POTENTIAL: Good  EVALUATION COMPLEXITY: Moderate    PLAN:  OT FREQUENCY: 2x/week  OT DURATION: 12 weeks  PLANNED INTERVENTIONS: 97168 OT Re-evaluation, 97535 self care/ADL training, 02889 therapeutic exercise, 97530 therapeutic activity, 97112 neuromuscular re-education, visual/perceptual remediation/compensation, patient/family education, and DME and/or AE instructions  RECOMMENDED OTHER SERVICES: ST  CONSULTED AND AGREED WITH PLAN OF CARE: Patient and family member/caregiver  PLAN FOR NEXT SESSION: Treatment  Richardson Otter, MS, OTR/L  05/07/24, 11:42 AM

## 2024-05-07 NOTE — Therapy (Signed)
 OUTPATIENT SPEECH LANGUAGE PATHOLOGY  TREATMENT / PROGRESS NOTE   Patient Name: Ashley Collins MRN: [id number] DOB:Jan 03, 1961, 63 y.o., female Today's Date: 05/07/2024  PCP: Gaetana Haddock, NP  REFERRING PROVIDER: same  Speech Therapy Progress Note  Dates of Reporting Period: 04/01/24 to 05/02/24  Objective: Patient has been seen for 10 speech therapy sessions this reporting period targeting cognitive-communication. Patient is making progress toward LTGs and met all STGs this reporting period. See skilled intervention, clinical impressions, and goals below for details.    End of Session - 05/07/24 1227     Visit Number 21    Number of Visits 24    Date for Recertification  05/14/24    SLP Start Time 1145    SLP Stop Time  1225    SLP Time Calculation (min) 40 min    Activity Tolerance Patient tolerated treatment well           Patient Active Problem List   Diagnosis Date Noted   Acute CVA (cerebrovascular accident) (HCC) 12/19/2023   Hypokalemia 12/19/2023   AKI (acute kidney injury) 12/19/2023   Leukocytosis 12/19/2023   Hyperlipidemia, unspecified 12/19/2023   Essential hypertension 12/19/2023   Vision changes 12/19/2023   Lymphedema 05/22/2023   Chronic venous insufficiency 05/22/2023   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 replacement therapy 04/10/2023   Insomnia due to medical condition 04/25/2022   GAD (generalized anxiety disorder) 02/02/2022   Other specified depressive episodes 02/02/2022   Long-term current use of benzodiazepine 02/02/2022   Migraine with aura and without status migrainosus, not intractable 11/03/2020   Arrhythmia 08/06/2020   Status migrainosus 02/25/2019   Osteoporosis 04/09/2013   Vitamin D deficiency 04/09/2013   DDD (degenerative disc disease), lumbosacral 02/08/2005    ONSET DATE: 12/19/23   REFERRING DIAG: CVA- memory deficits  THERAPY DIAG:  Cognitive communication deficit  Rationale for Evaluation and Treatment  Rehabilitation  SUBJECTIVE:   SUBJECTIVE STATEMENT: Pt alert, pleasant, and cooperative. Pt accompanied by: self and significant other  PERTINENT HISTORY & DIAGNOSTIC FINDINGS: Pt is 62 y.o. female who presents today for a cognitive-communication evaluation in setting of stroke. MRI 12/18/23 1. 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 No associated hemorrhage or mass effect. PMHx as outlined above.  PAIN:  Are you having pain? No   FALLS: Has patient fallen in last 6 months?  See PT evaluation for details  LIVING ENVIRONMENT: Lives with: lives with their spouse Lives in: House/apartment  PLOF:  Level of assistance: Independent with ADLs Employment: Full-time employment; prior stroke was working full time at a [employer]   PATIENT GOALS  to return to [employer]   OBJECTIVE:  TODAY'S TREATMENT:   The Neuro-QOLT Item Bank v2.0-Cognition Function-Short Form is an eight-item test designed to measure difficulties with cognitive functioning (e.g., memory, attention and decision making or in the application of such abilities to everyday tasks (e.g., planning, organizing, calculating, remembering and learning). Source: Meg CHARM Haber, J-S, et al. (2012). Neuro-QOL: brief measures of health-related quality of life for clinical research in neurology. Neurology, 78(23), [phone].   In the past 7 days...  I had to read something several times to understand it. 2- Often (once a day)  My thinking was slow. 1- Very Often (several times a day)  I had to work really hard to pay attention or I would make a mistake. 1- Very Often (several times a day)  I had trouble concentrating. 3- Sometimes (2-3 times)   How much DIFFICULTY do  you currently have...  Reading and following complex instructions (e.g., directions for a new medication)? 3- Somewhat  Planning for and keeping appointments that are not part of your weekly routine (e.g., a therapy or  doctor appointment, or a social gathering with friends and family)? 4- A little  Managing your time to do most of your daily activities? 4- A little  Learning new tasks or instructions? 4- A little   T-SCORE: 37; 1.3 SD below mean of 50  Discussed results of PROM, progress to date, pt's personal goals for ST, and current successes in cognitive-communication.     PATIENT EDUCATION: Education details: as above Person educated: Patient and Spouse Education method: Explanation Education comprehension: verbalized understanding  HOME EXERCISE PROGRAM:        Continue compensations for attention and memory      GOALS:  Goals reviewed with patient? Yes  SHORT TERM GOALS: Target date: 10 sessions  Pt will complete PROM re: memory.  Baseline: Goal status: MET   2.  Pt will endorse successful implementation of at least x2 compensations for attention and memory.  Baseline:  Goal status: MET  3.  With Moderate A, patient will establish external aid for memory/executive function and bring to more than 50% of therapy sessions.    Baseline:  Goal status: MET    LONG TERM GOALS: Target date: 12 weeks  Pt will endorse improvement in cognitive-communication per PROM.  Baseline:  Goal status: PROGRESSING  2.  Pt and/or husband will demonstrate understanding of ways to promote and support cognitive-communication outside of SLP sessions.  Baseline:  Goal status: MET   ASSESSMENT:  CLINICAL IMPRESSION:  Pt is 63 y.o. female who presents today for a cognitive-communication treatment in setting of stroke. Initial assessment completed via formal means (Cognitive-Linguistic Quick Test) and PROM (Neuro-QoL Adult Cognitive Function v2.0). Pt presents with at least mild cognitive-communication deficits affecting attention and visuospatial skills per CLQT as well as memory and executive functioning per PROM. Suspect CLQT is not as sensitive to higher level cognitive-communication deficits  appreciated by pt during iADLs. See details of today's tx as outlined above. Recommend skilled ST services targeting above mentioned deficits to improve QoL and performance on ADLs/IADLs.  OBJECTIVE IMPAIRMENTS include attention, memory, executive functioning, and visuospatial deficits. These impairments are limiting patient from return to work, managing appointments, household responsibilities, and ADLs/IADLs. Factors affecting potential to achieve goals and functional outcome are medical prognosis.. Patient will benefit from skilled SLP services to address above impairments and improve overall function.  REHAB POTENTIAL: Good  PLAN: SLP FREQUENCY: 2x/week  SLP DURATION: 12 weeks  PLANNED INTERVENTIONS: Cueing hierachy, Cognitive reorganization, Internal/external aids, Functional tasks, SLP instruction and feedback, Compensatory strategies, and Patient/family education    Delon Bangs, M.S., CCC-SLP Speech-Language Pathologist [facility] [phone] [place])  [facility] [address] [place], [place], [address] Phone: [phone]   Fax:  [phone]

## 2024-05-09 ENCOUNTER — Ambulatory Visit: Admitting: Occupational Therapy

## 2024-05-09 ENCOUNTER — Ambulatory Visit

## 2024-05-09 ENCOUNTER — Ambulatory Visit: Admitting: Physical Therapy

## 2024-05-09 DIAGNOSIS — R41841 Cognitive communication deficit: Secondary | ICD-10-CM

## 2024-05-09 DIAGNOSIS — M6281 Muscle weakness (generalized): Secondary | ICD-10-CM

## 2024-05-09 NOTE — Therapy (Signed)
 OUTPATIENT SPEECH LANGUAGE PATHOLOGY  TREATMENT    Patient Name: Ashley Collins MRN: [id number] DOB:Jul 05, 1960, 63 y.o., female Today's Date: 05/09/2024  PCP: Gaetana Haddock, NP  REFERRING PROVIDER: same  Speech Therapy Progress Note  Dates of Reporting Period: 04/01/24 to 05/02/24  Objective: Patient has been seen for 10 speech therapy sessions this reporting period targeting cognitive-communication. Patient is making progress toward LTGs and met all STGs this reporting period. See skilled intervention, clinical impressions, and goals below for details.    End of Session - 05/09/24 1608     Visit Number 22    Number of Visits 24    Date for Recertification  05/14/24    SLP Start Time 1525    SLP Stop Time  1605    SLP Time Calculation (min) 40 min    Activity Tolerance Patient tolerated treatment well           Patient Active Problem List   Diagnosis Date Noted   Acute CVA (cerebrovascular accident) (HCC) 12/19/2023   Hypokalemia 12/19/2023   AKI (acute kidney injury) 12/19/2023   Leukocytosis 12/19/2023   Hyperlipidemia, unspecified 12/19/2023   Essential hypertension 12/19/2023   Vision changes 12/19/2023   Lymphedema 05/22/2023   Chronic venous insufficiency 05/22/2023   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 replacement therapy 04/10/2023   Insomnia due to medical condition 04/25/2022   GAD (generalized anxiety disorder) 02/02/2022   Other specified depressive episodes 02/02/2022   Long-term current use of benzodiazepine 02/02/2022   Migraine with aura and without status migrainosus, not intractable 11/03/2020   Arrhythmia 08/06/2020   Status migrainosus 02/25/2019   Osteoporosis 04/09/2013   Vitamin D deficiency 04/09/2013   DDD (degenerative disc disease), lumbosacral 02/08/2005    ONSET DATE: 12/19/23   REFERRING DIAG: CVA- memory deficits  THERAPY DIAG:  Cognitive communication deficit  Rationale for Evaluation and Treatment Rehabilitation  SUBJECTIVE:    SUBJECTIVE STATEMENT: Pt alert, pleasant, and cooperative. Pt accompanied by: self and significant other  PERTINENT HISTORY & DIAGNOSTIC FINDINGS: Pt is 63 y.o. female who presents today for a cognitive-communication evaluation in setting of stroke. MRI 12/18/23 1. 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 No associated hemorrhage or mass effect. PMHx as outlined above.  PAIN:  Are you having pain? No   FALLS: Has patient fallen in last 6 months?  See PT evaluation for details  LIVING ENVIRONMENT: Lives with: lives with their spouse Lives in: House/apartment  PLOF:  Level of assistance: Independent with ADLs Employment: Full-time employment; prior stroke was working full time at a [employer]   PATIENT GOALS  to return to PLOF   OBJECTIVE:  TODAY'S TREATMENT:  Pt identified difficulty with short term recall particularly for reading and watching TV series as an area of focus. Introduced compensatory strategies for improved attention and memory while reading. Pt brought in x3 books from home and tasked with previewing books, selecting book, and planning to read x1 chapter prior to next visit.     PATIENT EDUCATION: Education details: as above Person educated: Patient and Spouse Education method: Explanation Education comprehension: verbalized understanding  HOME EXERCISE PROGRAM:        Continue compensations for attention and memory (with emphasis on reading)      GOALS:  Goals reviewed with patient? Yes  SHORT TERM GOALS: Target date: 10 sessions  Pt will complete PROM re: memory.  Baseline: Goal status: MET   2.  Pt will endorse successful implementation of at least x2  compensations for attention and memory.  Baseline:  Goal status: MET  3.  With Moderate A, patient will establish external aid for memory/executive function and bring to more than 50% of therapy sessions.    Baseline:  Goal status:  MET    LONG TERM GOALS: Target date: 12 weeks  Pt will endorse improvement in cognitive-communication per PROM.  Baseline:  Goal status: PROGRESSING  2.  Pt and/or husband will demonstrate understanding of ways to promote and support cognitive-communication outside of SLP sessions.  Baseline:  Goal status: MET   ASSESSMENT:  CLINICAL IMPRESSION:  Pt is 63 y.o. female who presents today for a cognitive-communication treatment in setting of stroke. Initial assessment completed via formal means (Cognitive-Linguistic Quick Test) and PROM (Neuro-QoL Adult Cognitive Function v2.0). Pt presents with at least mild cognitive-communication deficits affecting attention and visuospatial skills per CLQT as well as memory and executive functioning per PROM. Suspect CLQT is not as sensitive to higher level cognitive-communication deficits appreciated by pt during iADLs. See details of today's tx as outlined above. Recommend skilled ST services targeting above mentioned deficits to improve QoL and performance on ADLs/IADLs.  OBJECTIVE IMPAIRMENTS include attention, memory, executive functioning, and visuospatial deficits. These impairments are limiting patient from return to work, managing appointments, household responsibilities, and ADLs/IADLs. Factors affecting potential to achieve goals and functional outcome are medical prognosis.. Patient will benefit from skilled SLP services to address above impairments and improve overall function.  REHAB POTENTIAL: Good  PLAN: SLP FREQUENCY: 2x/week  SLP DURATION: 12 weeks  PLANNED INTERVENTIONS: Cueing hierachy, Cognitive reorganization, Internal/external aids, Functional tasks, SLP instruction and feedback, Compensatory strategies, and Patient/family education    Delon Bangs, M.S., CCC-SLP Speech-Language Pathologist [place] [facility] [phone] [place])  [facility] Outpatient Rehabilitation at  [facility] [address] [place], [place], [address] Phone: [phone]   Fax:  [phone]

## 2024-05-09 NOTE — Therapy (Addendum)
 Occupational Therapy Neuro Treatment Note   Patient Name: Ashley Collins MRN: [id number] DOB:May 30, 1961, 63 y.o., female Today's Date: 05/09/2024  PCP: Harvey Gaetana CROME, NP REFERRING PROVIDER: Harvey Gaetana CROME, NP   OT End of Session - 05/09/24 1649     Visit Number 39    Number of Visits 48    Date for Recertification  05/29/24    OT Start Time 1615    OT Stop Time 1700    OT Time Calculation (min) 45 min    Activity Tolerance Patient tolerated treatment well    Behavior During Therapy WFL for tasks assessed/performed               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   Cardiac arrhythmia    Nonspecific ST T wave changes on EKG   Chronic venous insufficiency of lower extremity    Complication of anesthesia    nausea and vomiting   DDD (degenerative disc disease), lumbosacral    Essential hypertension    Headache    History of kidney stones    Hyperlipidemia    Kidney stones    Lymphedema    Migraines    Osteoporosis    PONV (postoperative nausea and vomiting)    Right ureteral stone    Vitamin B12 deficiency    Vitamin D deficiency    Past Surgical History:  Procedure Laterality Date   AUGMENTATION MAMMAPLASTY     CESAREAN SECTION     x 4   COLONOSCOPY WITH PROPOFOL  N/A 03/31/2022   Procedure: COLONOSCOPY WITH PROPOFOL ;  Surgeon: Onita Elspeth Sharper, DO;  Location: [facility] ENDOSCOPY;  Service: Gastroenterology;  Laterality: N/A;   CYSTOSCOPY W/ RETROGRADES Bilateral 07/10/2020   Procedure: CYSTOSCOPY WITH RETROGRADE PYELOGRAM;  Surgeon: Francisca Redell BROCKS, MD;  Location: [facility] ORS;  Service: Urology;  Laterality: Bilateral;   CYSTOSCOPY/URETEROSCOPY/HOLMIUM LASER/STENT PLACEMENT     CYSTOSCOPY/URETEROSCOPY/HOLMIUM LASER/STENT PLACEMENT Bilateral 07/10/2020   Procedure: CYSTOSCOPY/URETEROSCOPY/HOLMIUM LASER/STENT PLACEMENT;  Surgeon: Francisca Redell BROCKS, MD;  Location: [facility] ORS;  Service:  Urology;  Laterality: Bilateral;   CYSTOSCOPY/URETEROSCOPY/HOLMIUM LASER/STENT PLACEMENT Right 08/25/2023   Procedure: CYSTOSCOPY/URETEROSCOPY/HOLMIUM LASER;  Surgeon: Francisca Redell BROCKS, MD;  Location: [facility] ORS;  Service: Urology;  Laterality: Right;   CYSTOSCOPY/URETEROSCOPY/HOLMIUM LASER/STENT PLACEMENT Right 12/15/2023   Procedure: CYSTOSCOPY/URETEROSCOPY/HOLMIUM LASER;  Surgeon: Francisca Redell BROCKS, MD;  Location: [facility] ORS;  Service: Urology;  Laterality: Right;   EXTRACORPOREAL SHOCK WAVE LITHOTRIPSY     x 10 plus   Eye Lift     FACIAL COSMETIC SURGERY     GANGLION CYST EXCISION Right 12/21/2021   Procedure: REMOVAL GANGLION OF WRIST;  Surgeon: Kathlynn Sharper, MD;  Location: [facility] ORS;  Service: Orthopedics;  Laterality: Right;   LIPOSUCTION     TONSILLECTOMY     TRIGGER FINGER RELEASE Right 12/21/2021   Procedure: RELEASE TRIGGER FINGER/A-1 PULLEY;  Surgeon: Kathlynn Sharper, MD;  Location: [facility] ORS;  Service: Orthopedics;  Laterality: Right;   URETEROSCOPY WITH HOLMIUM LASER LITHOTRIPSY     Patient Active Problem List   Diagnosis Date Noted   Acute CVA (cerebrovascular accident) (HCC) 12/19/2023   Hypokalemia 12/19/2023   AKI (acute kidney injury) 12/19/2023   Leukocytosis 12/19/2023   Hyperlipidemia, unspecified 12/19/2023   Essential hypertension 12/19/2023   Vision changes 12/19/2023   Lymphedema 05/22/2023   Chronic venous insufficiency 05/22/2023   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 replacement therapy 04/10/2023  Insomnia due to medical condition 04/25/2022   GAD (generalized anxiety disorder) 02/02/2022   Other specified depressive episodes 02/02/2022   Long-term current use of benzodiazepine 02/02/2022   Migraine with aura and without status migrainosus, not intractable 11/03/2020   Arrhythmia 08/06/2020   Status migrainosus 02/25/2019   Osteoporosis 04/09/2013   Vitamin D deficiency 04/09/2013   DDD (degenerative disc disease), lumbosacral 02/08/2005   ONSET DATE:  12/18/2023  REFERRING DIAG:   THERAPY DIAG:  Muscle weakness (generalized)  Rationale for Evaluation and Treatment: Rehabilitation  SUBJECTIVE:  SUBJECTIVE STATEMENT:   Pt. reports that her husband is sick today.  Pt accompanied by: significant other  PERTINENT HISTORY: Pt. has Hx of stroke with onset 12/18/2023. Pt. PMHx includes: HTN, Hyperlipidemia, Kidney Stones, near syncopal event, loss of vision.  PRECAUTIONS: None  WEIGHT BEARING RESTRICTIONS: None  PAIN:  Are you having pain? No  FALLS: Has patient fallen in last 6 months? Yes. Number of falls    LIVING ENVIRONMENT: Lives with: lives with their family Lives in: [place], [place] Stairs: No, inside the house, yes but does not use Has following equipment at home:   PLOF: Independent  PATIENT GOALS: To be able to see  OBJECTIVE:   HAND DOMINANCE: Right  ADLs:  Eating: Drinking from straw is different, able to use utensils.  Grooming: Fatigues completing hair care.  UB Dressing: independent, if clothes are in front of her she can find clothing LB Dressing: Independent Toileting: Independent Bathing: Independent Tub Shower transfers: Independent Equipment: none Has difficulty with using cell phone  IADLs: Shopping: Does not typically go out shopping, and has not tried to. Light housekeeping: Pt. reports that she does not typically do house cleaning. Pt. has been able to unpack belongings from boxes due to her recent move in. Meal Prep: Pt. reports that is does not currently cook, however reports that she probably could. Has difficulty opening bottle caps/lids. Community mobility: Requires assistance from husband to navigate  through buildings, negotiate stairs-Pt. with increased fear of falling  Medication management: Able to push down medication bottles to open  Financial management: No change in the process-uses automatic bill pay system.  Has difficulty with using cell phone Handwriting: N/T Work: Pt. was  actively working managing a health visitor, works a lot of hours  MOBILITY STATUS: Independent and Needs Assist: Requires hand on hand assistance with nvigating through environments.   POSTURE COMMENTS:  No Significant postural limitations Sitting balance: Good  ACTIVITY TOLERANCE: Activity tolerance: Good  FUNCTIONAL OUTCOME MEASURES:   UPPER EXTREMITY ROM:    Active ROM Right Eval WFL Left Eval [place]  Shoulder flexion    Shoulder abduction    Shoulder adduction    Shoulder extension    Shoulder internal rotation    Shoulder external rotation    Elbow flexion    Elbow extension    Wrist flexion    Wrist extension    Wrist ulnar deviation    Wrist radial deviation    Wrist pronation    Wrist supination    (Blank rows = not tested)  UPPER EXTREMITY MMT:     MMT Right eval Right 01/29/24 Right  04/04/24 Left eval Left 01/29/24 Left 04/04/24  Shoulder flexion 4-/5 5/5 5/5 4-/5 5/5 5/5  Shoulder abduction 4-/5 5/5 5/5 4-/5 5/5 5/5  Shoulder adduction        Shoulder extension        Shoulder internal rotation        Shoulder external  rotation        Middle trapezius        Lower trapezius        Elbow flexion 5/5 5/5 5/5 5/5 5/5 5/5  Elbow extension 5/5 5/5 5/5 5/5 5/5 5/5  Wrist flexion 4/5 5/5 5/5 4-/5 5/5 5/5  Wrist extension 4/5 5/5 5/5 4-/5 5/5 5/5  Wrist ulnar deviation        Wrist radial deviation        Wrist pronation        Wrist supination        (Blank rows = not tested)  HAND FUNCTION: Grip strength: Right: 39 lbs; Left: 28 lbs, Lateral pinch: Right: 11 lbs, Left: 9 lbs, and 3 point pinch: Right: 9 lbs, Left: 9 lbs  01/29/24 Grip strength: Right: 39 lbs; Left: 36 lbs, Lateral pinch: Right: 14 lbs, Left: 12 lbs, and 3 point pinch: Right: 12 lbs, Left: 11 lbs  04/04/24:  Grip strength: Right: 37 lbs; Left: 42 lbs    COORDINATION: 9 Hole Peg test: Right: 30 sec; Left: 36 sec  01/29/24 9 Hole Peg test: Right: 24 sec; Left: 23  sec.  04/04/24:  9 Hole Peg test: Right: 28 sec; Left: 33 sec.  SENSATION: WFL  EDEMA:   MUSCLE TONE:   COGNITION: Overall cognitive status: Within functional limits for tasks assessed  VISION: Subjective report: Pt. reports changes in her vision at onset of stroke, starting with blurry vision, resulting in vision loss in the R eye.  Baseline vision: Pt. Is able to visually track  Visual history: Hx of eye surgery  VISION ASSESSMENT: TBD  PERCEPTION: WFL  PRAXIS: WFL  OBSERVATIONS:                                                                                                                    TREATMENT DATE: 05/09/24   Therapeutic activities:   -Facilitated visual compensatory strategies using visual scanning through, and sorting multiple 1/2 objects placed randomly on a page.  -Pt. education was provided about visual compensatory strategies using an anchor at the far left 2/2 multiple missed items on the far left of the page. -Facilitated visual memory skills recalling the sorted objects from the visual scanning page.   PATIENT EDUCATION:  Pt. education was provided about visual compensatory strategies using an anchor at the far left of a page during tabletop tasks.  HOME EXERCISE PROGRAM:  -Green Theraputty exercises for  hand strengthening at bridge -Visual perceptual: Flip flop target design patterns. -Visual memory tasks -Writing tasks -visual motor tasks.  GOALS: Goals reviewed with patient? Yes  SHORT TERM GOALS: Target date: 04/17/2024    Pt. Will independently utilize HEP for hand strength, coordination, and visual compensatory strategies ADL/ADLs Baseline: 04/12/23: Independent, continue 03/06/24: Independent Eval: No  current HEP  Goal status: Ongoing   LONG TERM GOALS: Target date: 05/29/2024   Pt. Will be able to independently implement visual scanning/visual search compensatory strategies for tasks within her extra personal space  navigating through community environments 100% of the time.  Baseline: 01/29/24:  Independent  100% of the time within the therapy gym, and hallway. Eval: Pt. Requires increased assist to navigate through community environments. Goal status: Achieved  2.  Pt. Will be able to independently utilize visual scanning/visual search strategies  during ADLs/IADLs within her near space, and during tabletop tasks, and tasks at a vertical plane with 100% accuracy. Baseline: 04/11/24: 80% simple to moderately complex visual scanning tasks however 70% accuracy for complex visual scanning tasks, and scanning at a vertical plane. 03/06/24: Initiates visual scanning 85% of the time  01/29/24: 75% Eval: Pt. Education to be provided.  Goal status: revised-04/11/24  3.  Pt. Will be able to independently initiate visual scanning techniques in her own environment to reduce risk of falls.  Baseline: 01/29/24: Education was provided, Pt. is utlizing visual compensatory strategies/visual scanning within her home. Eval: Pt. Education to be provided.  Goal status: Achieved  4.  Pt. Will increase BUE Grip Strength by 5# to be able to independently and securely hold objects for ADL/IADL use. Baseline:  04/11/24: Continue- recent revision 04/04/2024: Grip strength: Right: 37 lbs; Left: 42 lbs patient is dropping items from her hands 01/29/24: Grip strength: Right: 39 lbs; Left: 28 lbs, Eval: Right Grip: 39#, Left Grip: 28# Goal status: Ongoing-recent revision  5.  Pt. Will increase L Lateral Pinch strength by 2# to be able to open bottles. Baseline: 04/04/2024: Grip strength: Right: 37 lbs; Left: 42 lbs 01/29/24: Lateral pinch: Right: 14 lbs, Left: 12 lbs Eval: Lateral pinch: Right: 11 lbs, Left: 9 lbs  Goal status: Achieved  6.  Pt. Will increase BUE strength for shoulder flexion/abduction by 2 mm grades to independently complete hair care tasks. Baseline: 03/16/2024: 5/5 01/29/24: 5/5 overall Eval: Right Shoulder Flexion: 4-/5,  Left Shoulder Flexion:4-/5, Right Shoulder Abduction: 4-/5, Left Shoulder Abduction: 4-/5 Goal status: Achieved  7.  Pt will be able to independently and efficiently manipulate medication without dropping them.  Baseline: 04/11/24: Continue, recent revision 04/04/2024: 9 Hole Peg test: Right: 28 sec; Left: 33 sec. 01/29/24: Independent 01/15/24: Daughter currently manages medication set up.  Goal status: Ongoing, recent revision  8. Pt will increase typing speed to 20-30 words per minute with at least 90% accuracy to work towards more efficient typing for job related  responsibilities.  Baseline: 04/11/24: TBD 03/06/24: 10 wpm with 92% accuracy. 01/29/24: Continue 01/15/24: 8wpm x88% accuracy=7 wpm  Goal status: Ongoing  9. Pt will be able to scan small print on store receipts to check for errors with 100% accuracy using visual compensation strategies as needed.  Baseline:04/11/24: Pt. Continues to present with difficulty with visual scanning small  small items efficiently. 03/06/24: Pt. Continues to present with difficulty with visual scanning small  small items efficiently. 01/29/24: Pt. Continues to have difficulty scanning small print items. 01/15/24: Not yet attempted; required for return to work/job responsibility  Goal status:  Ongoing  10. Pt. will independently identify 8 items consistently from visual visual memory in preparation or ADLs.    Baseline: 04/11/24: 5-7 items  while seated at the tabletop. 03/06/24: Pt. is able to identify up to 6 picture items while seated at the tabletop. Pt. is able to consistently Identify 4 letters from visual memory when attention is divided.  01/29/24: Pt. is able to identify 6 items consistently from visual memory.    Gaol status: Ongoing      11. Pt. will be independently write 4 sentences  efficiently  with 100% legibility, no deviation from writing on a blank line, and appropriate spacing between letters.              Baseline: 04/11/24: Continue 03/06/24: 4  lines with 75% legibility, positive deviation below the line, and excessive spacing between the words.              Goal status: Ongoing  ASSESSMENT:  CLINICAL IMPRESSION:   Pt. worked on multiple trials of sorting objects placed randomly on a page during the visual scanning task.  Pt. missed 4 items in the 1st trial, 3 items during the 2nd trial, and 2 items during the 3rd trial. With most of the missed items on the far left of the page. Pt. Education was provided about the use of a anchor to assist with visual scanning to the far left.  Pt. Was able to recall 2 items and 1 with cues during the first trial, 2 items in the 2nd trial with cues required for 2, and 2 objects were recalled in the 3rd trial and and one with a cue. Pt. continues to benefit from Occupational Therapy services to improve her ability to use visual compensatory strategies, improve visual memory skills and improve overall BUE functioning in order to improve engagement in, and maximize overall independence with ADLs, and IADL tasks.    PERFORMANCE DEFICITS: in functional skills including ADLs, IADLs, coordination, dexterity, Fine motor control, and vision, and psychosocial skills including coping strategies, environmental adaptation, habits, interpersonal interactions, and routines and behaviors.   IMPAIRMENTS: are limiting patient from ADLs, IADLs, rest and sleep, work, leisure, and social participation.   CO-MORBIDITIES: may have co-morbidities  that affects occupational performance. Patient will benefit from skilled OT to address above impairments and improve overall function.  MODIFICATION OR ASSISTANCE TO COMPLETE EVALUATION: Min-Moderate modification of tasks or assist with assess necessary to complete an evaluation.  OT OCCUPATIONAL PROFILE AND HISTORY: Detailed assessment: Review of records and additional review of physical, cognitive, psychosocial history related to current functional performance.  CLINICAL DECISION  MAKING: Moderate - several treatment options, min-mod task modification necessary  REHAB POTENTIAL: Good  EVALUATION COMPLEXITY: Moderate    PLAN:  OT FREQUENCY: 2x/week  OT DURATION: 12 weeks  PLANNED INTERVENTIONS: 97168 OT Re-evaluation, 97535 self care/ADL training, 02889 therapeutic exercise, 97530 therapeutic activity, 97112 neuromuscular re-education, visual/perceptual remediation/compensation, patient/family education, and DME and/or AE instructions  RECOMMENDED OTHER SERVICES: ST  CONSULTED AND AGREED WITH PLAN OF CARE: Patient and family member/caregiver  PLAN FOR NEXT SESSION: Treatment  Richardson Otter, MS, OTR/L  05/09/24, 5:10 PM

## 2024-05-14 ENCOUNTER — Ambulatory Visit: Admitting: Occupational Therapy

## 2024-05-14 ENCOUNTER — Ambulatory Visit

## 2024-05-14 DIAGNOSIS — R41841 Cognitive communication deficit: Secondary | ICD-10-CM

## 2024-05-14 DIAGNOSIS — M6281 Muscle weakness (generalized): Secondary | ICD-10-CM

## 2024-05-14 NOTE — Therapy (Signed)
 OUTPATIENT SPEECH LANGUAGE PATHOLOGY  TREATMENT / RE-CERTIFICATION   Patient Name: Ashley Collins MRN: [id number] DOB:[birth year], 63 y.o., female Today's Date: 05/14/2024  PCP: Gaetana Haddock, NP  REFERRING PROVIDER: same     End of Session - 05/14/24 1146     Visit Number 23    Number of Visits 47    Date for Recertification  08/06/24    SLP Start Time 1145    SLP Stop Time  1230    SLP Time Calculation (min) 45 min    Activity Tolerance Patient tolerated treatment well           Patient Active Problem List   Diagnosis Date Noted   Acute CVA (cerebrovascular accident) (HCC) 12/19/2023   Hypokalemia 12/19/2023   AKI (acute kidney injury) 12/19/2023   Leukocytosis 12/19/2023   Hyperlipidemia, unspecified 12/19/2023   Essential hypertension 12/19/2023   Vision changes 12/19/2023   Lymphedema 05/22/2023   Chronic venous insufficiency 05/22/2023   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 replacement therapy 04/10/2023   Insomnia due to medical condition 04/25/2022   GAD (generalized anxiety disorder) 02/02/2022   Other specified depressive episodes 02/02/2022   Long-term current use of benzodiazepine 02/02/2022   Migraine with aura and without status migrainosus, not intractable 11/03/2020   Arrhythmia 08/06/2020   Status migrainosus 02/25/2019   Osteoporosis 04/09/2013   Vitamin D deficiency 04/09/2013   DDD (degenerative disc disease), lumbosacral 02/08/2005    ONSET DATE: 12/19/23   REFERRING DIAG: CVA- memory deficits  THERAPY DIAG:  Cognitive communication deficit  Rationale for Evaluation and Treatment Rehabilitation  SUBJECTIVE:   SUBJECTIVE STATEMENT: Pt alert, pleasant, and cooperative. Pt accompanied by: self and significant other  PERTINENT HISTORY & DIAGNOSTIC FINDINGS: Pt is 63 y.o. female who presents today for a cognitive-communication evaluation in setting of stroke. MRI 12/18/23 1. Small acute left PCA distribution infarct involving the left occipital  cortex. No associated hemorrhage or mass effect. PMHx as outlined above.  PAIN:  Are you having pain? No   FALLS: Has patient fallen in last 6 months?  See PT evaluation for details  LIVING ENVIRONMENT: Lives with: lives with their spouse Lives in: House/apartment  PLOF:  Level of assistance: Independent with ADLs Employment: Full-time employment; prior stroke was working full time at a [employer]   PATIENT GOALS  to return to PLOF   OBJECTIVE:  TODAY'S TREATMENT:  Pt identified difficulty with short term recall particularly for reading and watching TV series as an area of focus. Reviewed compensatory strategies for improved attention and memory while reading. Pt endorsed reading aloud, taking notes, re-reading, and taking breaks PRN. Pt recalled several details from chapter with use of strategies; however, pt with difficulty summarizing the chapter/synthesizing the information. Will continue to address.     PATIENT EDUCATION: Education details: as above Person educated: Patient and Spouse Education method: Explanation Education comprehension: verbalized understanding  HOME EXERCISE PROGRAM:        Continue compensations for attention and memory (with emphasis on reading)      GOALS:  Goals reviewed with patient? Yes  SHORT TERM GOALS: Target date: 10 sessions  Pt will complete PROM re: memory.  Baseline: Goal status: MET   2.  Pt will endorse successful implementation of at least x2 compensations for attention and memory.  Baseline:  Goal status: MET  3.  With Moderate A, patient will establish external aid for memory/executive function and bring to more than 50% of therapy sessions.    Baseline:  Goal  status: MET  New goals - established 05/14/2024 4. Pt will utilize compensation for attention/recall (e.g. reading aloud, notetaking) to recall and summarize details in a functional task (e.g. therapy session, reading, tv  program).  Baseline:  Goal status: INITIAL   5. Pt will demonstrate alternating attention over 10 minutes between 2 mod complex cognitive-linguistic tasks >80% accuracy with modified Independence. Baseline: Goal status: INITIAL    6. Pt will identify x3 compensations for improved cognitive-communication during functional activities at home/work.   Baseline:   Goal status: INITIAL    LONG TERM GOALS: Target date: 12 weeks  Pt will endorse improvement in cognitive-communication per PROM.  Baseline:  Goal status: MET  2.  Pt and/or husband will demonstrate understanding of ways to promote and support cognitive-communication outside of SLP sessions.  Baseline:  Goal status: MET  New goal - established 05/14/24 3. Patient will report engagement in cognitive activities outside of ST 4/7 days.   Baseline:   Goal status: INITIAL  ASSESSMENT:  CLINICAL IMPRESSION:  Pt is 64 y.o. female who presents today for a cognitive-communication treatment in setting of stroke. Initial assessment completed via formal means (Cognitive-Linguistic Quick Test) and PROM (Neuro-QoL Adult Cognitive Function v2.0). Pt presents with at least mild cognitive-communication deficits affecting attention and visuospatial skills per CLQT as well as memory and executive functioning per PROM. Suspect CLQT is not as sensitive to higher level cognitive-communication deficits appreciated by pt during iADLs. Pt continues to make excellent progress; however, pt continues with impaired working memory/divided attention and short term recall. Pt planning to transition back to work in in January 2026 and pt would benefit from further compensation training and cognitive retraining to promote a successful transition back to work. Please see details of today's tx as outlined above. Recommend skilled ST services targeting above mentioned deficits to improve QoL and performance on ADLs/IADLs.  OBJECTIVE IMPAIRMENTS include attention,  memory, executive functioning, and visuospatial deficits. These impairments are limiting patient from return to work, managing appointments, household responsibilities, and ADLs/IADLs. Factors affecting potential to achieve goals and functional outcome are medical prognosis.. Patient will benefit from skilled SLP services to address above impairments and improve overall function.  REHAB POTENTIAL: Good  PLAN: SLP FREQUENCY: 1-2x/week  SLP DURATION: 12 weeks  PLANNED INTERVENTIONS: Cueing hierachy, Cognitive reorganization, Internal/external aids, Functional tasks, SLP instruction and feedback, Compensatory strategies, and Patient/family education    Delon Bangs, M.S., CCC-SLP Speech-Language Pathologist [facility] [phone] [place])  [facility] at [facility] [address] [employer] [place], [place], [address] Phone: [phone]   Fax:  [phone]

## 2024-05-14 NOTE — Therapy (Signed)
 Occupational Therapy Progress/Recertification Note  Dates of reporting period  04/11/24   to   05/14/24    Patient Name: Ashley Collins MRN: [id number] DOB:[birth year], 63 y.o., female Today's Date: 05/14/2024  PCP: Harvey Gaetana CROME, NP REFERRING PROVIDER: Harvey Gaetana CROME, NP   OT End of Session - 05/14/24 1107     Visit Number 40    Number of Visits 72    Date for Recertification  08/06/24    OT Start Time 1105    OT Stop Time 1145    OT Time Calculation (min) 40 min    Activity Tolerance Patient tolerated treatment well    Behavior During Therapy WFL for tasks assessed/performed               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   Cardiac arrhythmia    Nonspecific ST T wave changes on EKG   Chronic venous insufficiency of lower extremity    Complication of anesthesia    nausea and vomiting   DDD (degenerative disc disease), lumbosacral    Essential hypertension    Headache    History of kidney stones    Hyperlipidemia    Kidney stones    Lymphedema    Migraines    Osteoporosis    PONV (postoperative nausea and vomiting)    Right ureteral stone    Vitamin B12 deficiency    Vitamin D deficiency    Past Surgical History:  Procedure Laterality Date   AUGMENTATION MAMMAPLASTY     CESAREAN SECTION     x 4   COLONOSCOPY WITH PROPOFOL  N/A 03/31/2022   Procedure: COLONOSCOPY WITH PROPOFOL ;  Surgeon: Onita Elspeth Sharper, DO;  Location: [facility] ENDOSCOPY;  Service: Gastroenterology;  Laterality: N/A;   CYSTOSCOPY W/ RETROGRADES Bilateral 07/10/2020   Procedure: CYSTOSCOPY WITH RETROGRADE PYELOGRAM;  Surgeon: Francisca Redell BROCKS, MD;  Location: [facility] ORS;  Service: Urology;  Laterality: Bilateral;   CYSTOSCOPY/URETEROSCOPY/HOLMIUM LASER/STENT PLACEMENT     CYSTOSCOPY/URETEROSCOPY/HOLMIUM LASER/STENT PLACEMENT Bilateral 07/10/2020   Procedure: CYSTOSCOPY/URETEROSCOPY/HOLMIUM LASER/STENT PLACEMENT;   Surgeon: Francisca Redell BROCKS, MD;  Location: [facility] ORS;  Service: Urology;  Laterality: Bilateral;   CYSTOSCOPY/URETEROSCOPY/HOLMIUM LASER/STENT PLACEMENT Right 08/25/2023   Procedure: CYSTOSCOPY/URETEROSCOPY/HOLMIUM LASER;  Surgeon: Francisca Redell BROCKS, MD;  Location: [facility] ORS;  Service: Urology;  Laterality: Right;   CYSTOSCOPY/URETEROSCOPY/HOLMIUM LASER/STENT PLACEMENT Right 12/15/2023   Procedure: CYSTOSCOPY/URETEROSCOPY/HOLMIUM LASER;  Surgeon: Francisca Redell BROCKS, MD;  Location: [facility] ORS;  Service: Urology;  Laterality: Right;   EXTRACORPOREAL SHOCK WAVE LITHOTRIPSY     x 10 plus   Eye Lift     FACIAL COSMETIC SURGERY     GANGLION CYST EXCISION Right 12/21/2021   Procedure: REMOVAL GANGLION OF WRIST;  Surgeon: Kathlynn Sharper, MD;  Location: [facility] ORS;  Service: Orthopedics;  Laterality: Right;   LIPOSUCTION     TONSILLECTOMY     TRIGGER FINGER RELEASE Right 12/21/2021   Procedure: RELEASE TRIGGER FINGER/A-1 PULLEY;  Surgeon: Kathlynn Sharper, MD;  Location: [facility] ORS;  Service: Orthopedics;  Laterality: Right;   URETEROSCOPY WITH HOLMIUM LASER LITHOTRIPSY     Patient Active Problem List   Diagnosis Date Noted   Acute CVA (cerebrovascular accident) (HCC) 12/19/2023   Hypokalemia 12/19/2023   AKI (acute kidney injury) 12/19/2023   Leukocytosis 12/19/2023   Hyperlipidemia, unspecified 12/19/2023   Essential hypertension 12/19/2023   Vision changes 12/19/2023   Lymphedema 05/22/2023   Chronic venous  insufficiency 05/22/2023   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 replacement therapy 04/10/2023   Insomnia due to medical condition 04/25/2022   GAD (generalized anxiety disorder) 02/02/2022   Other specified depressive episodes 02/02/2022   Long-term current use of benzodiazepine 02/02/2022   Migraine with aura and without status migrainosus, not intractable 11/03/2020   Arrhythmia 08/06/2020   Status migrainosus 02/25/2019   Osteoporosis 04/09/2013   Vitamin D deficiency 04/09/2013   DDD (degenerative  disc disease), lumbosacral 02/08/2005   ONSET DATE: 12/18/2023  REFERRING DIAG:   THERAPY DIAG:  Muscle weakness (generalized)  Rationale for Evaluation and Treatment: Rehabilitation  SUBJECTIVE:  SUBJECTIVE STATEMENT:   Pt. reports doing okay today  Pt accompanied by: significant other  PERTINENT HISTORY: Pt. has Hx of stroke with onset 12/18/2023. Pt. PMHx includes: HTN, Hyperlipidemia, Kidney Stones, near syncopal event, loss of vision.  PRECAUTIONS: None  WEIGHT BEARING RESTRICTIONS: None  PAIN:  Are you having pain? No  FALLS: Has patient fallen in last 6 months? Yes. Number of falls    LIVING ENVIRONMENT: Lives with: lives with their family Lives in: [place], [place] Stairs: No, inside the house, yes but does not use Has following equipment at home:   PLOF: Independent  PATIENT GOALS: To be able to see  OBJECTIVE:   HAND DOMINANCE: Right  ADLs:  Eating: Drinking from straw is different, able to use utensils.  Grooming: Fatigues completing hair care.  UB Dressing: independent, if clothes are in front of her she can find clothing LB Dressing: Independent Toileting: Independent Bathing: Independent Tub Shower transfers: Independent Equipment: none Has difficulty with using cell phone  IADLs: Shopping: Does not typically go out shopping, and has not tried to. Light housekeeping: Pt. reports that she does not typically do house cleaning. Pt. has been able to unpack belongings from boxes due to her recent move in. Meal Prep: Pt. reports that is does not currently cook, however reports that she probably could. Has difficulty opening bottle caps/lids. Community mobility: Requires assistance from husband to navigate  through buildings, negotiate stairs-Pt. with increased fear of falling  Medication management: Able to push down medication bottles to open  Financial management: No change in the process-uses automatic bill pay system.  Has difficulty with using cell  phone Handwriting: N/T Work: Pt. was actively working managing a health visitor, works a lot of hours  MOBILITY STATUS: Independent and Needs Assist: Requires hand on hand assistance with nvigating through environments.   POSTURE COMMENTS:  No Significant postural limitations Sitting balance: Good  ACTIVITY TOLERANCE: Activity tolerance: Good  FUNCTIONAL OUTCOME MEASURES:   UPPER EXTREMITY ROM:    Active ROM Right Eval WFL Left Eval [facility]  Shoulder flexion    Shoulder abduction    Shoulder adduction    Shoulder extension    Shoulder internal rotation    Shoulder external rotation    Elbow flexion    Elbow extension    Wrist flexion    Wrist extension    Wrist ulnar deviation    Wrist radial deviation    Wrist pronation    Wrist supination    (Blank rows = not tested)  UPPER EXTREMITY MMT:     MMT Right eval Right 01/29/24 Right  04/04/24 Left eval Left 01/29/24 Left 04/04/24  Shoulder flexion 4-/5 5/5 5/5 4-/5 5/5 5/5  Shoulder abduction 4-/5 5/5 5/5 4-/5 5/5 5/5  Shoulder adduction        Shoulder extension        Shoulder internal  rotation        Shoulder external rotation        Middle trapezius        Lower trapezius        Elbow flexion 5/5 5/5 5/5 5/5 5/5 5/5  Elbow extension 5/5 5/5 5/5 5/5 5/5 5/5  Wrist flexion 4/5 5/5 5/5 4-/5 5/5 5/5  Wrist extension 4/5 5/5 5/5 4-/5 5/5 5/5  Wrist ulnar deviation        Wrist radial deviation        Wrist pronation        Wrist supination        (Blank rows = not tested)  HAND FUNCTION: Grip strength: Right: 39 lbs; Left: 28 lbs, Lateral pinch: Right: 11 lbs, Left: 9 lbs, and 3 point pinch: Right: 9 lbs, Left: 9 lbs  01/29/24 Grip strength: Right: 39 lbs; Left: 36 lbs, Lateral pinch: Right: 14 lbs, Left: 12 lbs, and 3 point pinch: Right: 12 lbs, Left: 11 lbs  04/04/24:  Grip strength: Right: 37 lbs; Left: 42 lbs  05/14/24  Grip strength: Right: 55 lbs; Left: 47 lbs  COORDINATION:  9 Hole Peg  test: Right: 30 sec; Left: 36 sec  01/29/24 9 Hole Peg test: Right: 24 sec; Left: 23 sec.  04/04/24:  9 Hole Peg test: Right: 28 sec; Left: 33 sec.  05/14/24:  9 Hole Peg test: Right: 23 sec; Left: 22 sec.  SENSATION: WFL  EDEMA:   MUSCLE TONE:   COGNITION: Overall cognitive status: Within functional limits for tasks assessed  VISION: Subjective report: Pt. reports changes in her vision at onset of stroke, starting with blurry vision, resulting in vision loss in the R eye.  Baseline vision: Pt. Is able to visually track  Visual history: Hx of eye surgery  VISION ASSESSMENT: TBD  PERCEPTION: WFL  PRAXIS: WFL  OBSERVATIONS:                                                                                                                    TREATMENT DATE: 05/14/24   Measurements were obtained, and goals were reviewed with the Pt.   PATIENT EDUCATION:  Pt. education was provided  pt. Functional status, goals.  HOME EXERCISE PROGRAM:  -Green Theraputty exercises for  hand strengthening at bridge -Visual perceptual: Flip flop target design patterns. -Visual memory tasks -Writing tasks -visual motor tasks.  GOALS: Goals reviewed with patient? Yes  SHORT TERM GOALS: Target date: 06/25/2024   Pt. Will independently utilize HEP for hand strength, coordination, and visual compensatory strategies ADL/ADLs Baseline: 05/14/24: Independent 04/12/23: Independent, continue 03/06/24: Independent Eval: No  current HEP  Goal status: Ongoing   LONG TERM GOALS: Target date: 08/06/2024  Pt. Will be able to independently implement visual scanning/visual search compensatory strategies for tasks within her extra personal space navigating through community environments 100% of the time.  Baseline: 01/29/24:  Independent  100% of the time within the therapy gym, and hallway. Eval: Pt. Requires increased assist to navigate  through community environments. Goal status: Achieved  2.   Pt. Will be able to independently utilize visual scanning/visual search strategies  during ADLs/IADLs within her near space, and during tabletop tasks, and tasks at a vertical plane with 100% accuracy. Baseline: 05/14/24: 90% simple to moderately complex visual scanning tasks however 75% accuracy for complex visual scanning tasks, and scanning at a vertical plane. 04/11/24: 80% simple to moderately complex visual scanning tasks however 70% accuracy for complex visual scanning tasks, and scanning at a vertical plane.  03/06/24: Initiates visual scanning 85% of the time  01/29/24: 75% Eval: Pt. Education to be provided.  Goal status: Progressing Ongoing  3.  Pt. Will be able to independently initiate visual scanning techniques in her own environment to reduce risk of falls.  Baseline: 01/29/24: Education was provided, Pt. is utlizing visual compensatory strategies/visual scanning within her home. Eval: Pt. Education to be provided.  Goal status: Achieved  4.  Pt. Will increase BUE Grip Strength by 5# to be able to independently and securely hold objects for ADL/IADL use. Baseline: 05/14/24: Grip strength: Right: 55 lbs; Left: 47 lbs Pt. Has improved with holding items within her hand without dropping them.04/11/24: Continue- recent revision 04/04/2024: Grip strength: Right: 37 lbs; Left: 42 lbs patient is dropping items from her hands 01/29/24: Grip strength: Right: 39 lbs; Left: 28 lbs, Eval: Right Grip: 39#, Left Grip: 28# Goal status: Achieved  5.  Pt. Will increase L Lateral Pinch strength by 2# to be able to open bottles. Baseline: 05/14/24: Pt. is able to open previously opened jars/bottles. Has difficulty with unopened jars/bottles.04/04/2024: Grip strength: Right: 37 lbs; Left: 42 lbs 01/29/24: Lateral pinch: Right: 14 lbs, Left: 12 lbs Eval: Lateral pinch: Right: 11 lbs, Left: 9 lbs  Goal status: Deferred  6.  Pt. Will increase BUE strength for shoulder flexion/abduction by 2 mm grades to  independently complete hair care tasks. Baseline:  03/16/2024: 5/5 01/29/24: 5/5 overall Eval: Right Shoulder Flexion: 4-/5, Left Shoulder Flexion:4-/5, Right Shoulder Abduction: 4-/5, Left Shoulder Abduction: 4-/5 Goal status: Achieved  7.  Pt will be able to independently and efficiently manipulate medication without dropping them.  Baseline: 05/14/24: 9 Hole Peg test: Right: 23 sec; Left: 22 sec. 04/11/24: Continue, recent revision 04/04/2024: 9 Hole Peg test: Right: 28 sec; Left: 33 sec. 01/29/24: Independent 01/15/24: Daughter currently manages medication set up.  Goal status: Ongoing  8. Pt will increase typing speed to 20-30 words per minute with at least 90% accuracy to work towards more efficient typing for job related  responsibilities.  Baseline: 05/14/24: Pt. Continues to present with multiple mistypes, and requires increased time.05/14/24: 04/11/24: TBD 03/06/24: 10 wpm with 92% accuracy. 01/29/24: Continue 01/15/24: 8wpm x88% accuracy=7 wpm  Goal status: Ongoing  9. Pt will be able to scan small print on store receipts to check for errors with 100% accuracy using visual compensation strategies as needed.  Baseline: 05/14/24: Pt. Is improving with efficiency of visually scanning receipts.04/11/24: Pt. Continues to present with difficulty with visual scanning small  small items efficiently. 03/06/24: Pt. Continues to present with difficulty with visual scanning small  small items efficiently. 01/29/24: Pt. Continues to have difficulty scanning small print items. 01/15/24: Not yet attempted; required for return to work/job responsibility  Goal status:  Ongoing  10. Pt. will independently identify 8 items consistently from visual visual memory in preparation or ADLs.    Baseline: 05/14/24: 5-7 items.04/11/24: 5-7 items  while seated at the tabletop. 03/06/24: Pt. is able to  identify up to 6 picture items while seated at the tabletop. Pt. is able to consistently Identify 4 letters from visual memory  when attention is divided.  01/29/24: Pt. is able to identify 6 items consistently from visual memory.    Gaol status: Ongoing      11. Pt. will be independently write 4 sentences  efficiently with 100% legibility, no deviation from writing on a blank line, and appropriate spacing between letters.              Baseline: 05/14/24: 4 lines with 80% legibility in printed form, completed in 2 min. & 16 sec. 04/11/24: Continue 03/06/24: 4 lines with 75% legibility, positive deviation below the line, and excessive spacing between the words.              Goal status: Ongoing  ASSESSMENT:  CLINICAL IMPRESSION:  Measurements were obtained, and goals were reviewed with the Pt.  Pt. Has improved with grip strength, holding items more securely, [facility] skills, manipulating small objects during ADLs and IADL tasks. Pt. Continues to present with difficulty with consistency of recalling items from visual memory, visual scanning items at the tabletop in complex contexts, and typing speed, and accuracy. Pt. continues to benefit from Occupational Therapy services to improve her ability to use visual compensatory strategies, improve visual memory skills and improve overall BUE functioning in order to improve engagement in, and maximize overall independence with ADLs, and IADL tasks.    PERFORMANCE DEFICITS: in functional skills including ADLs, IADLs, coordination, dexterity, Fine motor control, and vision, and psychosocial skills including coping strategies, environmental adaptation, habits, interpersonal interactions, and routines and behaviors.   IMPAIRMENTS: are limiting patient from ADLs, IADLs, rest and sleep, work, leisure, and social participation.   CO-MORBIDITIES: may have co-morbidities  that affects occupational performance. Patient will benefit from skilled OT to address above impairments and improve overall function.  MODIFICATION OR ASSISTANCE TO COMPLETE EVALUATION: Min-Moderate modification of tasks or  assist with assess necessary to complete an evaluation.  OT OCCUPATIONAL PROFILE AND HISTORY: Detailed assessment: Review of records and additional review of physical, cognitive, psychosocial history related to current functional performance.  CLINICAL DECISION MAKING: Moderate - several treatment options, min-mod task modification necessary  REHAB POTENTIAL: Good  EVALUATION COMPLEXITY: Moderate    PLAN:  OT FREQUENCY: 2x/week  OT DURATION: 12 weeks  PLANNED INTERVENTIONS: 97168 OT Re-evaluation, 97535 self care/ADL training, 02889 therapeutic exercise, 97530 therapeutic activity, 97112 neuromuscular re-education, visual/perceptual remediation/compensation, patient/family education, and DME and/or AE instructions  RECOMMENDED OTHER SERVICES: ST  CONSULTED AND AGREED WITH PLAN OF CARE: Patient and family member/caregiver  PLAN FOR NEXT SESSION: Treatment  Richardson Otter, MS, OTR/L  05/14/24, 11:08 AM

## 2024-05-16 ENCOUNTER — Ambulatory Visit: Admitting: Occupational Therapy

## 2024-05-16 ENCOUNTER — Ambulatory Visit: Admitting: Physical Therapy

## 2024-05-16 ENCOUNTER — Ambulatory Visit

## 2024-05-16 DIAGNOSIS — R41841 Cognitive communication deficit: Secondary | ICD-10-CM

## 2024-05-16 DIAGNOSIS — M6281 Muscle weakness (generalized): Secondary | ICD-10-CM | POA: Diagnosis not present

## 2024-05-16 NOTE — Therapy (Signed)
 Occupational Therapy Neuro Treatment Note   Patient Name: Ashley Collins MRN: [id number] DOB:1961/03/14, 63 y.o., female Today's Date: 05/16/2024  PCP: Harvey Gaetana CROME, NP REFERRING PROVIDER: Harvey Gaetana CROME, NP   OT End of Session - 05/16/24 2318     Visit Number 41    Number of Visits 72    Date for Recertification  08/06/24    OT Start Time 1615    OT Stop Time 1700    OT Time Calculation (min) 45 min    Activity Tolerance Patient tolerated treatment well    Behavior During Therapy WFL for tasks assessed/performed               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   Cardiac arrhythmia    Nonspecific ST T wave changes on EKG   Chronic venous insufficiency of lower extremity    Complication of anesthesia    nausea and vomiting   DDD (degenerative disc disease), lumbosacral    Essential hypertension    Headache    History of kidney stones    Hyperlipidemia    Kidney stones    Lymphedema    Migraines    Osteoporosis    PONV (postoperative nausea and vomiting)    Right ureteral stone    Vitamin B12 deficiency    Vitamin D deficiency    Past Surgical History:  Procedure Laterality Date   AUGMENTATION MAMMAPLASTY     CESAREAN SECTION     x 4   COLONOSCOPY WITH PROPOFOL  N/A 03/31/2022   Procedure: COLONOSCOPY WITH PROPOFOL ;  Surgeon: Onita Elspeth Sharper, DO;  Location: [facility];  Service: Gastroenterology;  Laterality: N/A;   CYSTOSCOPY W/ RETROGRADES Bilateral 07/10/2020   Procedure: CYSTOSCOPY WITH RETROGRADE PYELOGRAM;  Surgeon: Francisca Redell BROCKS, MD;  Location: [facility];  Service: Urology;  Laterality: Bilateral;   CYSTOSCOPY/URETEROSCOPY/HOLMIUM LASER/STENT PLACEMENT     CYSTOSCOPY/URETEROSCOPY/HOLMIUM LASER/STENT PLACEMENT Bilateral 07/10/2020   Procedure: CYSTOSCOPY/URETEROSCOPY/HOLMIUM LASER/STENT PLACEMENT;  Surgeon: Francisca Redell BROCKS, MD;  Location: [facility];  Service:  Urology;  Laterality: Bilateral;   CYSTOSCOPY/URETEROSCOPY/HOLMIUM LASER/STENT PLACEMENT Right 08/25/2023   Procedure: CYSTOSCOPY/URETEROSCOPY/HOLMIUM LASER;  Surgeon: Francisca Redell BROCKS, MD;  Location: [facility];  Service: Urology;  Laterality: Right;   CYSTOSCOPY/URETEROSCOPY/HOLMIUM LASER/STENT PLACEMENT Right 12/15/2023   Procedure: CYSTOSCOPY/URETEROSCOPY/HOLMIUM LASER;  Surgeon: Francisca Redell BROCKS, MD;  Location: [facility];  Service: Urology;  Laterality: Right;   EXTRACORPOREAL SHOCK WAVE LITHOTRIPSY     x 10 plus   Eye Lift     FACIAL COSMETIC SURGERY     GANGLION CYST EXCISION Right 12/21/2021   Procedure: REMOVAL GANGLION OF WRIST;  Surgeon: Kathlynn Sharper, MD;  Location: [facility];  Service: Orthopedics;  Laterality: Right;   LIPOSUCTION     TONSILLECTOMY     TRIGGER FINGER RELEASE Right 12/21/2021   Procedure: RELEASE TRIGGER FINGER/A-1 PULLEY;  Surgeon: Kathlynn Sharper, MD;  Location: [facility];  Service: Orthopedics;  Laterality: Right;   URETEROSCOPY WITH HOLMIUM LASER LITHOTRIPSY     Patient Active Problem List   Diagnosis Date Noted   Acute CVA (cerebrovascular accident) (HCC) 12/19/2023   Hypokalemia 12/19/2023   AKI (acute kidney injury) 12/19/2023   Leukocytosis 12/19/2023   Hyperlipidemia, unspecified 12/19/2023   Essential hypertension 12/19/2023   Vision changes 12/19/2023   Lymphedema 05/22/2023   Chronic venous insufficiency 05/22/2023   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 replacement therapy 04/10/2023  Insomnia due to medical condition 04/25/2022   GAD (generalized anxiety disorder) 02/02/2022   Other specified depressive episodes 02/02/2022   Long-term current use of benzodiazepine 02/02/2022   Migraine with aura and without status migrainosus, not intractable 11/03/2020   Arrhythmia 08/06/2020   Status migrainosus 02/25/2019   Osteoporosis 04/09/2013   Vitamin D deficiency 04/09/2013   DDD (degenerative disc disease), lumbosacral 02/08/2005   ONSET DATE:  12/18/2023  REFERRING DIAG:   THERAPY DIAG:  Muscle weakness (generalized)  Rationale for Evaluation and Treatment: Rehabilitation  SUBJECTIVE:  SUBJECTIVE STATEMENT:   Pt. reports that she has started reading Ellouise Turner's new book.  Pt accompanied by: significant other  PERTINENT HISTORY: Pt. has Hx of stroke with onset 12/18/2023. Pt. PMHx includes: HTN, Hyperlipidemia, Kidney Stones, near syncopal event, loss of vision.  PRECAUTIONS: None  WEIGHT BEARING RESTRICTIONS: None  PAIN:  Are you having pain? No  FALLS: Has patient fallen in last 6 months? Yes. Number of falls    LIVING ENVIRONMENT: Lives with: lives with their family Lives in: [place], [place] Stairs: No, inside the house, yes but does not use Has following equipment at home:   PLOF: Independent  PATIENT GOALS: To be able to see  OBJECTIVE:   HAND DOMINANCE: Right  ADLs:  Eating: Drinking from straw is different, able to use utensils.  Grooming: Fatigues completing hair care.  UB Dressing: independent, if clothes are in front of her she can find clothing LB Dressing: Independent Toileting: Independent Bathing: Independent Tub Shower transfers: Independent Equipment: none Has difficulty with using cell phone  IADLs: Shopping: Does not typically go out shopping, and has not tried to. Light housekeeping: Pt. reports that she does not typically do house cleaning. Pt. has been able to unpack belongings from boxes due to her recent move in. Meal Prep: Pt. reports that is does not currently cook, however reports that she probably could. Has difficulty opening bottle caps/lids. Community mobility: Requires assistance from husband to navigate  through buildings, negotiate stairs-Pt. with increased fear of falling  Medication management: Able to push down medication bottles to open  Financial management: No change in the process-uses automatic bill pay system.  Has difficulty with using cell  phone Handwriting: N/T Work: Pt. was actively working managing a health visitor, works a lot of hours  MOBILITY STATUS: Independent and Needs Assist: Requires hand on hand assistance with nvigating through environments.   POSTURE COMMENTS:  No Significant postural limitations Sitting balance: Good  ACTIVITY TOLERANCE: Activity tolerance: Good  FUNCTIONAL OUTCOME MEASURES:   UPPER EXTREMITY ROM:    Active ROM Right Eval WFL Left Eval [facility]  Shoulder flexion    Shoulder abduction    Shoulder adduction    Shoulder extension    Shoulder internal rotation    Shoulder external rotation    Elbow flexion    Elbow extension    Wrist flexion    Wrist extension    Wrist ulnar deviation    Wrist radial deviation    Wrist pronation    Wrist supination    (Blank rows = not tested)  UPPER EXTREMITY MMT:     MMT Right eval Right 01/29/24 Right  04/04/24 Left eval Left 01/29/24 Left 04/04/24  Shoulder flexion 4-/5 5/5 5/5 4-/5 5/5 5/5  Shoulder abduction 4-/5 5/5 5/5 4-/5 5/5 5/5  Shoulder adduction        Shoulder extension        Shoulder internal rotation  Shoulder external rotation        Middle trapezius        Lower trapezius        Elbow flexion 5/5 5/5 5/5 5/5 5/5 5/5  Elbow extension 5/5 5/5 5/5 5/5 5/5 5/5  Wrist flexion 4/5 5/5 5/5 4-/5 5/5 5/5  Wrist extension 4/5 5/5 5/5 4-/5 5/5 5/5  Wrist ulnar deviation        Wrist radial deviation        Wrist pronation        Wrist supination        (Blank rows = not tested)  HAND FUNCTION: Grip strength: Right: 39 lbs; Left: 28 lbs, Lateral pinch: Right: 11 lbs, Left: 9 lbs, and 3 point pinch: Right: 9 lbs, Left: 9 lbs  01/29/24 Grip strength: Right: 39 lbs; Left: 36 lbs, Lateral pinch: Right: 14 lbs, Left: 12 lbs, and 3 point pinch: Right: 12 lbs, Left: 11 lbs  04/04/24:  Grip strength: Right: 37 lbs; Left: 42 lbs  05/14/24  Grip strength: Right: 55 lbs; Left: 47 lbs  COORDINATION:  9 Hole Peg  test: Right: 30 sec; Left: 36 sec  01/29/24 9 Hole Peg test: Right: 24 sec; Left: 23 sec.  04/04/24:  9 Hole Peg test: Right: 28 sec; Left: 33 sec.  05/14/24:  9 Hole Peg test: Right: 23 sec; Left: 22 sec.  SENSATION: WFL  EDEMA:   MUSCLE TONE:   COGNITION: Overall cognitive status: Within functional limits for tasks assessed  VISION: Subjective report: Pt. reports changes in her vision at onset of stroke, starting with blurry vision, resulting in vision loss in the R eye.  Baseline vision: Pt. Is able to visually track  Visual history: Hx of eye surgery  VISION ASSESSMENT: TBD  PERCEPTION: WFL  PRAXIS: WFL  OBSERVATIONS:                                                                                                                    TREATMENT DATE: 05/16/24   Therapeutic Activities:   -Facilitated visual memory skills working on recalling specific details (similarities and differences) on 6 nutcrackers.  Patient was able to recall 50% of the details from visual memory in the first trial, and 70% of the details in the second trial of writing details down. -Facilitated visual memory skills recalling specific details using ADL IADL home safety picture cards with 75% accuracy   PATIENT EDUCATION:  Pt. education was provided  pt. Functional status, goals.  HOME EXERCISE PROGRAM:  -Upgraded to blue theraputty for gross grip strengthening. -Green Theraputty exercises for  hand strengthening at bridge -Visual perceptual: Flip flop target design patterns. -Visual memory tasks -Writing tasks -visual motor tasks.  GOALS: Goals reviewed with patient? Yes  SHORT TERM GOALS: Target date: 06/25/2024   Pt. Will independently utilize HEP for hand strength, coordination, and visual compensatory strategies ADL/ADLs Baseline: 05/14/24: Independent 04/12/23: Independent, continue 03/06/24: Independent Eval: No  current HEP  Goal status: Ongoing   LONG  TERM GOALS: Target  date: 08/06/2024  Pt. Will be able to independently implement visual scanning/visual search compensatory strategies for tasks within her extra personal space navigating through community environments 100% of the time.  Baseline: 01/29/24:  Independent  100% of the time within the therapy gym, and hallway. Eval: Pt. Requires increased assist to navigate through community environments. Goal status: Achieved  2.  Pt. Will be able to independently utilize visual scanning/visual search strategies  during ADLs/IADLs within her near space, and during tabletop tasks, and tasks at a vertical plane with 100% accuracy. Baseline: 05/14/24: 90% simple to moderately complex visual scanning tasks however 75% accuracy for complex visual scanning tasks, and scanning at a vertical plane. 04/11/24: 80% simple to moderately complex visual scanning tasks however 70% accuracy for complex visual scanning tasks, and scanning at a vertical plane.  03/06/24: Initiates visual scanning 85% of the time  01/29/24: 75% Eval: Pt. Education to be provided.  Goal status: Progressing Ongoing  3.  Pt. Will be able to independently initiate visual scanning techniques in her own environment to reduce risk of falls.  Baseline: 01/29/24: Education was provided, Pt. is utlizing visual compensatory strategies/visual scanning within her home. Eval: Pt. Education to be provided.  Goal status: Achieved  4.  Pt. Will increase BUE Grip Strength by 5# to be able to independently and securely hold objects for ADL/IADL use. Baseline: 05/14/24: Grip strength: Right: 55 lbs; Left: 47 lbs Pt. Has improved with holding items within her hand without dropping them.04/11/24: Continue- recent revision 04/04/2024: Grip strength: Right: 37 lbs; Left: 42 lbs patient is dropping items from her hands 01/29/24: Grip strength: Right: 39 lbs; Left: 28 lbs, Eval: Right Grip: 39#, Left Grip: 28# Goal status: Achieved  5.  Pt. Will increase L Lateral Pinch strength by 2#  to be able to open bottles. Baseline: 05/14/24: Pt. is able to open previously opened jars/bottles. Has difficulty with unopened jars/bottles.04/04/2024: Grip strength: Right: 37 lbs; Left: 42 lbs 01/29/24: Lateral pinch: Right: 14 lbs, Left: 12 lbs Eval: Lateral pinch: Right: 11 lbs, Left: 9 lbs  Goal status: Deferred  6.  Pt. Will increase BUE strength for shoulder flexion/abduction by 2 mm grades to independently complete hair care tasks. Baseline:  03/16/2024: 5/5 01/29/24: 5/5 overall Eval: Right Shoulder Flexion: 4-/5, Left Shoulder Flexion:4-/5, Right Shoulder Abduction: 4-/5, Left Shoulder Abduction: 4-/5 Goal status: Achieved  7.  Pt will be able to independently and efficiently manipulate medication without dropping them.  Baseline: 05/14/24: 9 Hole Peg test: Right: 23 sec; Left: 22 sec. 04/11/24: Continue, recent revision 04/04/2024: 9 Hole Peg test: Right: 28 sec; Left: 33 sec. 01/29/24: Independent 01/15/24: Daughter currently manages medication set up.  Goal status: Ongoing  8. Pt will increase typing speed to 20-30 words per minute with at least 90% accuracy to work towards more efficient typing for job related  responsibilities.  Baseline: 05/14/24: Pt. Continues to present with multiple mistypes, and requires increased time.05/14/24: 04/11/24: TBD 03/06/24: 10 wpm with 92% accuracy. 01/29/24: Continue 01/15/24: 8wpm x88% accuracy=7 wpm  Goal status: Ongoing  9. Pt will be able to scan small print on store receipts to check for errors with 100% accuracy using visual compensation strategies as needed.  Baseline: 05/14/24: Pt. Is improving with efficiency of visually scanning receipts.04/11/24: Pt. Continues to present with difficulty with visual scanning small  small items efficiently. 03/06/24: Pt. Continues to present with difficulty with visual scanning small  small items efficiently. 01/29/24: Pt. Continues to have difficulty  scanning small print items. 01/15/24: Not yet attempted; required  for return to work/job responsibility  Goal status:  Ongoing  10. Pt. will independently identify 8 items consistently from visual visual memory in preparation or ADLs.    Baseline: 05/14/24: 5-7 items.04/11/24: 5-7 items  while seated at the tabletop. 03/06/24: Pt. is able to identify up to 6 picture items while seated at the tabletop. Pt. is able to consistently Identify 4 letters from visual memory when attention is divided.  01/29/24: Pt. is able to identify 6 items consistently from visual memory.    Gaol status: Ongoing      11. Pt. will be independently write 4 sentences  efficiently with 100% legibility, no deviation from writing on a blank line, and appropriate spacing between letters.              Baseline: 05/14/24: 4 lines with 80% legibility in printed form, completed in 2 min. & 16 sec. 04/11/24: Continue 03/06/24: 4 lines with 75% legibility, positive deviation below the line, and excessive spacing between the words.              Goal status: Ongoing  ASSESSMENT:  CLINICAL IMPRESSION:  Patient completed 2 trials of visual memory tasks. The first trial patient was able to recall 50% of the details from visual memory.  Patient utilizes strategies such as writing down the details during the second trial. Patient was able to recall 70% of the details and the second trial. Patient was able to recall 75% of the details and the ADL/IADL home safety picture cards.  Patient upgraded to blue Theraputty resistance for gross grip strengthening at home. Pt. continues to benefit from Occupational Therapy services to improve her ability to use visual compensatory strategies, improve visual memory skills and improve overall BUE functioning in order to improve engagement in, and maximize overall independence with ADLs, and IADL tasks.    PERFORMANCE DEFICITS: in functional skills including ADLs, IADLs, coordination, dexterity, Fine motor control, and vision, and psychosocial skills including coping  strategies, environmental adaptation, habits, interpersonal interactions, and routines and behaviors.   IMPAIRMENTS: are limiting patient from ADLs, IADLs, rest and sleep, work, leisure, and social participation.   CO-MORBIDITIES: may have co-morbidities  that affects occupational performance. Patient will benefit from skilled OT to address above impairments and improve overall function.  MODIFICATION OR ASSISTANCE TO COMPLETE EVALUATION: Min-Moderate modification of tasks or assist with assess necessary to complete an evaluation.  OT OCCUPATIONAL PROFILE AND HISTORY: Detailed assessment: Review of records and additional review of physical, cognitive, psychosocial history related to current functional performance.  CLINICAL DECISION MAKING: Moderate - several treatment options, min-mod task modification necessary  REHAB POTENTIAL: Good  EVALUATION COMPLEXITY: Moderate    PLAN:  OT FREQUENCY: 2x/week  OT DURATION: 12 weeks  PLANNED INTERVENTIONS: 97168 OT Re-evaluation, 97535 self care/ADL training, 02889 therapeutic exercise, 97530 therapeutic activity, 97112 neuromuscular re-education, visual/perceptual remediation/compensation, patient/family education, and DME and/or AE instructions  RECOMMENDED OTHER SERVICES: ST  CONSULTED AND AGREED WITH PLAN OF CARE: Patient and family member/caregiver  PLAN FOR NEXT SESSION: Treatment  Richardson Otter, MS, OTR/L  05/16/24, 11:20 PM

## 2024-05-16 NOTE — Therapy (Signed)
 OUTPATIENT SPEECH LANGUAGE PATHOLOGY  TREATMENT   Patient Name: Ashley Collins MRN: [id number] DOB:1960/10/29, 63 y.o., female Today's Date: 05/16/2024  PCP: Gaetana Haddock, NP  REFERRING PROVIDER: same     End of Session - 05/16/24 1525     Visit Number 24    Number of Visits 47    Date for Recertification  08/06/24    SLP Start Time 1530    SLP Stop Time  1615    SLP Time Calculation (min) 45 min    Activity Tolerance Patient tolerated treatment well           Patient Active Problem List   Diagnosis Date Noted   Acute CVA (cerebrovascular accident) (HCC) 12/19/2023   Hypokalemia 12/19/2023   AKI (acute kidney injury) 12/19/2023   Leukocytosis 12/19/2023   Hyperlipidemia, unspecified 12/19/2023   Essential hypertension 12/19/2023   Vision changes 12/19/2023   Lymphedema 05/22/2023   Chronic venous insufficiency 05/22/2023   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 replacement therapy 04/10/2023   Insomnia due to medical condition 04/25/2022   GAD (generalized anxiety disorder) 02/02/2022   Other specified depressive episodes 02/02/2022   Long-term current use of benzodiazepine 02/02/2022   Migraine with aura and without status migrainosus, not intractable 11/03/2020   Arrhythmia 08/06/2020   Status migrainosus 02/25/2019   Osteoporosis 04/09/2013   Vitamin D deficiency 04/09/2013   DDD (degenerative disc disease), lumbosacral 02/08/2005    ONSET DATE: 12/19/23   REFERRING DIAG: CVA- memory deficits  THERAPY DIAG:  Cognitive communication deficit  Rationale for Evaluation and Treatment Rehabilitation  SUBJECTIVE:   SUBJECTIVE STATEMENT: Pt alert, pleasant, and cooperative. Pt accompanied by: self and significant other  PERTINENT HISTORY & DIAGNOSTIC FINDINGS: Pt is 63 y.o. female who presents today for a cognitive-communication evaluation in setting of stroke. MRI 12/18/23 1. 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 No  associated hemorrhage or mass effect. PMHx as outlined above.  PAIN:  Are you having pain? No   FALLS: Has patient fallen in last 6 months?  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 Pt was able to summarize key points from chapter in biography she is reading with use of notes. Pt stated she is continuing to read aloud, summarize intermittently, write notes and review notes prior to resuming reading. Will continue to target.  Divided attention targeted utilzing Constant Therapy app. Pt completed alternating symbols task (Levels 1 and 5) with ~90% accuracy, min/mod cues for use of attention strategies. Increased errors and amount of time needed to complete noted with visual distractors present. Discussed real word applications of divided attention.    PATIENT EDUCATION: Education details: as above Person educated: Patient and Spouse Education method: Explanation Education comprehension: verbalized understanding  HOME EXERCISE PROGRAM:        Continue compensations for attention and memory (with emphasis on reading)      GOALS:  Goals reviewed with patient? Yes  SHORT TERM GOALS: Target date: 10 sessions  Pt will complete PROM re: memory.  Baseline: Goal status: MET   2.  Pt will endorse successful implementation of at least x2 compensations for attention and memory.  Baseline:  Goal status: MET  3.  With Moderate A, patient will establish external aid for memory/executive function and bring to more than 50% of therapy sessions.    Baseline:  Goal status: MET  New goals - established 05/14/2024 4. Pt will utilize  compensation for attention/recall (e.g. reading aloud, notetaking) to recall and summarize details in a functional task (e.g. therapy session, reading, tv program).  Baseline:  Goal status: INITIAL   5. Pt will demonstrate alternating attention over 10 minutes between 2 mod complex cognitive-linguistic tasks >80% accuracy with modified Independence. Baseline: Goal status: INITIAL    6. Pt will identify x3 compensations for improved cognitive-communication during functional activities at home/work.   Baseline:   Goal status: INITIAL    LONG TERM GOALS: Target date: 12 weeks  Pt will endorse improvement in cognitive-communication per PROM.  Baseline:  Goal status: MET  2.  Pt and/or husband will demonstrate understanding of ways to promote and support cognitive-communication outside of SLP sessions.  Baseline:  Goal status: MET  New goal - established 05/14/24 3. Patient will report engagement in cognitive activities outside of ST 4/7 days.   Baseline:   Goal status: INITIAL  ASSESSMENT:  CLINICAL IMPRESSION:  Pt is 63 y.o. female who presents today for a cognitive-communication treatment in setting of stroke. Initial assessment completed via formal means (Cognitive-Linguistic Quick Test) and PROM (Neuro-QoL Adult Cognitive Function v2.0). Pt presents with at least mild cognitive-communication deficits affecting attention and visuospatial skills per CLQT as well as memory and executive functioning per PROM. Suspect CLQT is not as sensitive to higher level cognitive-communication deficits appreciated by pt during iADLs. Pt continues to make excellent progress; however, pt continues with impaired working memory/divided attention and short term recall. Pt planning to transition back to work in in January 2026 and pt would benefit from further compensation training and cognitive retraining to promote a successful transition back to work. Please see details of today's tx as outlined above.  Recommend skilled ST services targeting above mentioned deficits to improve QoL and performance on ADLs/IADLs.  OBJECTIVE IMPAIRMENTS include attention, memory, executive functioning, and visuospatial deficits. These impairments are limiting patient from return to work, managing appointments, household responsibilities, and ADLs/IADLs. Factors affecting potential to achieve goals and functional outcome are medical prognosis.. Patient will benefit from skilled SLP services to address above impairments and improve overall function.  REHAB POTENTIAL: Good  PLAN: SLP FREQUENCY: 1-2x/week  SLP DURATION: 12 weeks  PLANNED INTERVENTIONS: Cueing hierachy, Cognitive reorganization, Internal/external aids, Functional tasks, SLP instruction and feedback, Compensatory strategies, and Patient/family education    Delon Bangs, M.S., CCC-SLP Speech-Language Pathologist [facility] [phone] [place])  [facility] at [facility] [address] [place], [place], [address] Phone: [phone]   Fax:  [phone]

## 2024-05-20 ENCOUNTER — Ambulatory Visit: Admitting: Physical Therapy

## 2024-05-20 ENCOUNTER — Encounter

## 2024-05-21 ENCOUNTER — Ambulatory Visit

## 2024-05-21 ENCOUNTER — Ambulatory Visit: Admitting: Occupational Therapy

## 2024-05-21 ENCOUNTER — Ambulatory Visit: Admitting: Physical Therapy

## 2024-05-21 DIAGNOSIS — M6281 Muscle weakness (generalized): Secondary | ICD-10-CM | POA: Diagnosis not present

## 2024-05-21 DIAGNOSIS — H543 Unqualified visual loss, both eyes: Secondary | ICD-10-CM

## 2024-05-21 NOTE — Therapy (Signed)
 Occupational Therapy Neuro Treatment Note   Patient Name: Ashley Collins MRN: [id number] DOB:1960/11/13, 63 y.o., female Today's Date: 05/21/2024  PCP: Harvey Gaetana CROME, NP REFERRING PROVIDER: Harvey Gaetana CROME, NP   OT End of Session - 05/21/24 1409     Visit Number 42    Number of Visits 72    Date for Recertification  08/06/24    OT Start Time 1400    OT Stop Time 1445    OT Time Calculation (min) 45 min    Activity Tolerance Patient tolerated treatment well    Behavior During Therapy WFL for tasks assessed/performed               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   Cardiac arrhythmia    Nonspecific ST T wave changes on EKG   Chronic venous insufficiency of lower extremity    Complication of anesthesia    nausea and vomiting   DDD (degenerative disc disease), lumbosacral    Essential hypertension    Headache    History of kidney stones    Hyperlipidemia    Kidney stones    Lymphedema    Migraines    Osteoporosis    PONV (postoperative nausea and vomiting)    Right ureteral stone    Vitamin B12 deficiency    Vitamin D deficiency    Past Surgical History:  Procedure Laterality Date   AUGMENTATION MAMMAPLASTY     CESAREAN SECTION     x 4   COLONOSCOPY WITH PROPOFOL  N/A 03/31/2022   Procedure: COLONOSCOPY WITH PROPOFOL ;  Surgeon: Onita Elspeth Sharper, DO;  Location: [facility] ENDOSCOPY;  Service: Gastroenterology;  Laterality: N/A;   CYSTOSCOPY W/ RETROGRADES Bilateral 07/10/2020   Procedure: CYSTOSCOPY WITH RETROGRADE PYELOGRAM;  Surgeon: Francisca Redell BROCKS, MD;  Location: [facility] ORS;  Service: Urology;  Laterality: Bilateral;   CYSTOSCOPY/URETEROSCOPY/HOLMIUM LASER/STENT PLACEMENT     CYSTOSCOPY/URETEROSCOPY/HOLMIUM LASER/STENT PLACEMENT Bilateral 07/10/2020   Procedure: CYSTOSCOPY/URETEROSCOPY/HOLMIUM LASER/STENT PLACEMENT;  Surgeon: Francisca Redell BROCKS, MD;  Location: [facility] ORS;  Service:  Urology;  Laterality: Bilateral;   CYSTOSCOPY/URETEROSCOPY/HOLMIUM LASER/STENT PLACEMENT Right 08/25/2023   Procedure: CYSTOSCOPY/URETEROSCOPY/HOLMIUM LASER;  Surgeon: Francisca Redell BROCKS, MD;  Location: [facility] ORS;  Service: Urology;  Laterality: Right;   CYSTOSCOPY/URETEROSCOPY/HOLMIUM LASER/STENT PLACEMENT Right 12/15/2023   Procedure: CYSTOSCOPY/URETEROSCOPY/HOLMIUM LASER;  Surgeon: Francisca Redell BROCKS, MD;  Location: [facility] ORS;  Service: Urology;  Laterality: Right;   EXTRACORPOREAL SHOCK WAVE LITHOTRIPSY     x 10 plus   Eye Lift     FACIAL COSMETIC SURGERY     GANGLION CYST EXCISION Right 12/21/2021   Procedure: REMOVAL GANGLION OF WRIST;  Surgeon: Kathlynn Sharper, MD;  Location: [facility] ORS;  Service: Orthopedics;  Laterality: Right;   LIPOSUCTION     TONSILLECTOMY     TRIGGER FINGER RELEASE Right 12/21/2021   Procedure: RELEASE TRIGGER FINGER/A-1 PULLEY;  Surgeon: Kathlynn Sharper, MD;  Location: [facility] ORS;  Service: Orthopedics;  Laterality: Right;   URETEROSCOPY WITH HOLMIUM LASER LITHOTRIPSY     Patient Active Problem List   Diagnosis Date Noted   Acute CVA (cerebrovascular accident) (HCC) 12/19/2023   Hypokalemia 12/19/2023   AKI (acute kidney injury) 12/19/2023   Leukocytosis 12/19/2023   Hyperlipidemia, unspecified 12/19/2023   Essential hypertension 12/19/2023   Vision changes 12/19/2023   Lymphedema 05/22/2023   Chronic venous insufficiency 05/22/2023   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 replacement therapy 04/10/2023  Insomnia due to medical condition 04/25/2022   GAD (generalized anxiety disorder) 02/02/2022   Other specified depressive episodes 02/02/2022   Long-term current use of benzodiazepine 02/02/2022   Migraine with aura and without status migrainosus, not intractable 11/03/2020   Arrhythmia 08/06/2020   Status migrainosus 02/25/2019   Osteoporosis 04/09/2013   Vitamin D deficiency 04/09/2013   DDD (degenerative disc disease), lumbosacral 02/08/2005   ONSET DATE:  12/18/2023  REFERRING DIAG:   THERAPY DIAG:  Low vision, both eyes  Rationale for Evaluation and Treatment: Rehabilitation  SUBJECTIVE:  SUBJECTIVE STATEMENT:   Pt. reports that she has therapy again tomorrow, as well.  Pt accompanied by: significant other  PERTINENT HISTORY: Pt. has Hx of stroke with onset 12/18/2023. Pt. PMHx includes: HTN, Hyperlipidemia, Kidney Stones, near syncopal event, loss of vision.  PRECAUTIONS: None  WEIGHT BEARING RESTRICTIONS: None  PAIN:  Are you having pain? No  FALLS: Has patient fallen in last 6 months? Yes. Number of falls    LIVING ENVIRONMENT: Lives with: lives with their family Lives in: [place], [place] Stairs: No, inside the house, yes but does not use Has following equipment at home:   PLOF: Independent  PATIENT GOALS: To be able to see  OBJECTIVE:   HAND DOMINANCE: Right  ADLs:  Eating: Drinking from straw is different, able to use utensils.  Grooming: Fatigues completing hair care.  UB Dressing: independent, if clothes are in front of her she can find clothing LB Dressing: Independent Toileting: Independent Bathing: Independent Tub Shower transfers: Independent Equipment: none Has difficulty with using cell phone  IADLs: Shopping: Does not typically go out shopping, and has not tried to. Light housekeeping: Pt. reports that she does not typically do house cleaning. Pt. has been able to unpack belongings from boxes due to her recent move in. Meal Prep: Pt. reports that is does not currently cook, however reports that she probably could. Has difficulty opening bottle caps/lids. Community mobility: Requires assistance from husband to navigate  through buildings, negotiate stairs-Pt. with increased fear of falling  Medication management: Able to push down medication bottles to open  Financial management: No change in the process-uses automatic bill pay system.  Has difficulty with using cell phone Handwriting: N/T Work:  Pt. was actively working managing a health visitor, works a lot of hours  MOBILITY STATUS: Independent and Needs Assist: Requires hand on hand assistance with nvigating through environments.   POSTURE COMMENTS:  No Significant postural limitations Sitting balance: Good  ACTIVITY TOLERANCE: Activity tolerance: Good  FUNCTIONAL OUTCOME MEASURES:   UPPER EXTREMITY ROM:    Active ROM Right Eval WFL Left Eval [facility]  Shoulder flexion    Shoulder abduction    Shoulder adduction    Shoulder extension    Shoulder internal rotation    Shoulder external rotation    Elbow flexion    Elbow extension    Wrist flexion    Wrist extension    Wrist ulnar deviation    Wrist radial deviation    Wrist pronation    Wrist supination    (Blank rows = not tested)  UPPER EXTREMITY MMT:     MMT Right eval Right 01/29/24 Right  04/04/24 Left eval Left 01/29/24 Left 04/04/24  Shoulder flexion 4-/5 5/5 5/5 4-/5 5/5 5/5  Shoulder abduction 4-/5 5/5 5/5 4-/5 5/5 5/5  Shoulder adduction        Shoulder extension        Shoulder internal rotation  Shoulder external rotation        Middle trapezius        Lower trapezius        Elbow flexion 5/5 5/5 5/5 5/5 5/5 5/5  Elbow extension 5/5 5/5 5/5 5/5 5/5 5/5  Wrist flexion 4/5 5/5 5/5 4-/5 5/5 5/5  Wrist extension 4/5 5/5 5/5 4-/5 5/5 5/5  Wrist ulnar deviation        Wrist radial deviation        Wrist pronation        Wrist supination        (Blank rows = not tested)  HAND FUNCTION: Grip strength: Right: 39 lbs; Left: 28 lbs, Lateral pinch: Right: 11 lbs, Left: 9 lbs, and 3 point pinch: Right: 9 lbs, Left: 9 lbs  01/29/24 Grip strength: Right: 39 lbs; Left: 36 lbs, Lateral pinch: Right: 14 lbs, Left: 12 lbs, and 3 point pinch: Right: 12 lbs, Left: 11 lbs  04/04/24:  Grip strength: Right: 37 lbs; Left: 42 lbs  05/14/24  Grip strength: Right: 55 lbs; Left: 47 lbs  COORDINATION:  9 Hole Peg test: Right: 30 sec; Left: 36  sec  01/29/24 9 Hole Peg test: Right: 24 sec; Left: 23 sec.  04/04/24:  9 Hole Peg test: Right: 28 sec; Left: 33 sec.  05/14/24:  9 Hole Peg test: Right: 23 sec; Left: 22 sec.  SENSATION: WFL  EDEMA:   MUSCLE TONE:   COGNITION: Overall cognitive status: Within functional limits for tasks assessed  VISION: Subjective report: Pt. reports changes in her vision at onset of stroke, starting with blurry vision, resulting in vision loss in the R eye.  Baseline vision: Pt. Is able to visually track  Visual history: Hx of eye surgery  VISION ASSESSMENT: TBD  PERCEPTION: WFL  PRAXIS: WFL  OBSERVATIONS:                                                                                                                    TREATMENT DATE: 05/21/24   Therapeutic activities:   -Facilitated visual memory, and sequencing skills working to locate  2-7 & 8 step sequential tasks at a time with focus on increasing the complexity of the task. -Facilitated visual scanning for each step of the IADL sequence card placed randomly throughout the room. Pt. required increased cues for more simple sequential scenarios. -Facilitated visual motor tasks working towards building a vertical tabletop PCP Pipe tower following visual directions. -Facilitated visual memory skills working to construct the PCP vertical pipe tower from scratch.    PATIENT EDUCATION:  Pt. education was provided  pt. Functional status, goals.  HOME EXERCISE PROGRAM:  -Upgraded to blue theraputty for gross grip strengthening. -Green Theraputty exercises for  hand strengthening at bridge -Visual perceptual: Flip flop target design patterns. -Visual memory tasks -Writing tasks -visual motor tasks.  GOALS: Goals reviewed with patient? Yes  SHORT TERM GOALS: Target date: 06/25/2024   Pt. Will independently utilize HEP for hand strength, coordination, and visual compensatory  strategies ADL/ADLs Baseline: 05/14/24:  Independent 04/12/23: Independent, continue 03/06/24: Independent Eval: No  current HEP  Goal status: Ongoing   LONG TERM GOALS: Target date: 08/06/2024  Pt. Will be able to independently implement visual scanning/visual search compensatory strategies for tasks within her extra personal space navigating through community environments 100% of the time.  Baseline: 01/29/24:  Independent  100% of the time within the therapy gym, and hallway. Eval: Pt. Requires increased assist to navigate through community environments. Goal status: Achieved  2.  Pt. Will be able to independently utilize visual scanning/visual search strategies  during ADLs/IADLs within her near space, and during tabletop tasks, and tasks at a vertical plane with 100% accuracy. Baseline: 05/14/24: 90% simple to moderately complex visual scanning tasks however 75% accuracy for complex visual scanning tasks, and scanning at a vertical plane. 04/11/24: 80% simple to moderately complex visual scanning tasks however 70% accuracy for complex visual scanning tasks, and scanning at a vertical plane.  03/06/24: Initiates visual scanning 85% of the time  01/29/24: 75% Eval: Pt. Education to be provided.  Goal status: Progressing Ongoing  3.  Pt. Will be able to independently initiate visual scanning techniques in her own environment to reduce risk of falls.  Baseline: 01/29/24: Education was provided, Pt. is utlizing visual compensatory strategies/visual scanning within her home. Eval: Pt. Education to be provided.  Goal status: Achieved  4.  Pt. Will increase BUE Grip Strength by 5# to be able to independently and securely hold objects for ADL/IADL use. Baseline: 05/14/24: Grip strength: Right: 55 lbs; Left: 47 lbs Pt. Has improved with holding items within her hand without dropping them.04/11/24: Continue- recent revision 04/04/2024: Grip strength: Right: 37 lbs; Left: 42 lbs patient is dropping items from her hands 01/29/24: Grip strength: Right:  39 lbs; Left: 28 lbs, Eval: Right Grip: 39#, Left Grip: 28# Goal status: Achieved  5.  Pt. Will increase L Lateral Pinch strength by 2# to be able to open bottles. Baseline: 05/14/24: Pt. is able to open previously opened jars/bottles. Has difficulty with unopened jars/bottles.04/04/2024: Grip strength: Right: 37 lbs; Left: 42 lbs 01/29/24: Lateral pinch: Right: 14 lbs, Left: 12 lbs Eval: Lateral pinch: Right: 11 lbs, Left: 9 lbs  Goal status: Deferred  6.  Pt. Will increase BUE strength for shoulder flexion/abduction by 2 mm grades to independently complete hair care tasks. Baseline:  03/16/2024: 5/5 01/29/24: 5/5 overall Eval: Right Shoulder Flexion: 4-/5, Left Shoulder Flexion:4-/5, Right Shoulder Abduction: 4-/5, Left Shoulder Abduction: 4-/5 Goal status: Achieved  7.  Pt will be able to independently and efficiently manipulate medication without dropping them.  Baseline: 05/14/24: 9 Hole Peg test: Right: 23 sec; Left: 22 sec. 04/11/24: Continue, recent revision 04/04/2024: 9 Hole Peg test: Right: 28 sec; Left: 33 sec. 01/29/24: Independent 01/15/24: Daughter currently manages medication set up.  Goal status: Ongoing  8. Pt will increase typing speed to 20-30 words per minute with at least 90% accuracy to work towards more efficient typing for job related  responsibilities.  Baseline: 05/14/24: Pt. Continues to present with multiple mistypes, and requires increased time.05/14/24: 04/11/24: TBD 03/06/24: 10 wpm with 92% accuracy. 01/29/24: Continue 01/15/24: 8wpm x88% accuracy=7 wpm  Goal status: Ongoing  9. Pt will be able to scan small print on store receipts to check for errors with 100% accuracy using visual compensation strategies as needed.  Baseline: 05/14/24: Pt. Is improving with efficiency of visually scanning receipts.04/11/24: Pt. Continues to present with difficulty with visual scanning small  small items  efficiently. 03/06/24: Pt. Continues to present with difficulty with visual scanning  small  small items efficiently. 01/29/24: Pt. Continues to have difficulty scanning small print items. 01/15/24: Not yet attempted; required for return to work/job responsibility  Goal status:  Ongoing  10. Pt. will independently identify 8 items consistently from visual visual memory in preparation or ADLs.    Baseline: 05/14/24: 5-7 items.04/11/24: 5-7 items  while seated at the tabletop. 03/06/24: Pt. is able to identify up to 6 picture items while seated at the tabletop. Pt. is able to consistently Identify 4 letters from visual memory when attention is divided.  01/29/24: Pt. is able to identify 6 items consistently from visual memory.    Gaol status: Ongoing      11. Pt. will be independently write 4 sentences  efficiently with 100% legibility, no deviation from writing on a blank line, and appropriate spacing between letters.              Baseline: 05/14/24: 4 lines with 80% legibility in printed form, completed in 2 min. & 16 sec. 04/11/24: Continue 03/06/24: 4 lines with 75% legibility, positive deviation below the line, and excessive spacing between the words.              Goal status: Ongoing  ASSESSMENT:  CLINICAL IMPRESSION:  Pt. required cues for problem solving through the steps of more complex sequencing tasks. Pt. requires cues for visual memory tasks. Pt. was efficiently able to construct a simple vertical PCP Pipe tower, however required extensive cues to reconstruct it from visual memory. Pt. continues to benefit from Occupational Therapy services to improve her ability to use visual compensatory strategies, and improve overall BUE functioning in order to improve engagement in, and maximize overall independence with ADL, and IADL tasks.     PERFORMANCE DEFICITS: in functional skills including ADLs, IADLs, coordination, dexterity, Fine motor control, and vision, and psychosocial skills including coping strategies, environmental adaptation, habits, interpersonal interactions, and  routines and behaviors.   IMPAIRMENTS: are limiting patient from ADLs, IADLs, rest and sleep, work, leisure, and social participation.   CO-MORBIDITIES: may have co-morbidities  that affects occupational performance. Patient will benefit from skilled OT to address above impairments and improve overall function.  MODIFICATION OR ASSISTANCE TO COMPLETE EVALUATION: Min-Moderate modification of tasks or assist with assess necessary to complete an evaluation.  OT OCCUPATIONAL PROFILE AND HISTORY: Detailed assessment: Review of records and additional review of physical, cognitive, psychosocial history related to current functional performance.  CLINICAL DECISION MAKING: Moderate - several treatment options, min-mod task modification necessary  REHAB POTENTIAL: Good  EVALUATION COMPLEXITY: Moderate    PLAN:  OT FREQUENCY: 2x/week  OT DURATION: 12 weeks  PLANNED INTERVENTIONS: 97168 OT Re-evaluation, 97535 self care/ADL training, 02889 therapeutic exercise, 97530 therapeutic activity, 97112 neuromuscular re-education, visual/perceptual remediation/compensation, patient/family education, and DME and/or AE instructions  RECOMMENDED OTHER SERVICES: ST  CONSULTED AND AGREED WITH PLAN OF CARE: Patient and family member/caregiver  PLAN FOR NEXT SESSION: Treatment  Richardson Otter, MS, OTR/L  05/21/24, 2:11 PM

## 2024-05-22 ENCOUNTER — Ambulatory Visit: Admitting: Occupational Therapy

## 2024-05-22 ENCOUNTER — Ambulatory Visit

## 2024-05-22 DIAGNOSIS — H543 Unqualified visual loss, both eyes: Secondary | ICD-10-CM

## 2024-05-22 DIAGNOSIS — M6281 Muscle weakness (generalized): Secondary | ICD-10-CM | POA: Diagnosis not present

## 2024-05-22 DIAGNOSIS — R41841 Cognitive communication deficit: Secondary | ICD-10-CM

## 2024-05-22 NOTE — Therapy (Signed)
 OUTPATIENT SPEECH LANGUAGE PATHOLOGY  TREATMENT   Patient Name: Ashley Collins MRN: [id number] DOB:10-22-1960, 63 y.o., female Today's Date: 05/22/2024  PCP: Gaetana Haddock, NP  REFERRING PROVIDER: same     End of Session - 05/22/24 1142     Visit Number 25    Number of Visits 47    Date for Recertification  08/06/24    SLP Start Time 1100    SLP Stop Time  1145    SLP Time Calculation (min) 45 min    Activity Tolerance Patient tolerated treatment well           Patient Active Problem List   Diagnosis Date Noted   Acute CVA (cerebrovascular accident) (HCC) 12/19/2023   Hypokalemia 12/19/2023   AKI (acute kidney injury) 12/19/2023   Leukocytosis 12/19/2023   Hyperlipidemia, unspecified 12/19/2023   Essential hypertension 12/19/2023   Vision changes 12/19/2023   Lymphedema 05/22/2023   Chronic venous insufficiency 05/22/2023   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 replacement therapy 04/10/2023   Insomnia due to medical condition 04/25/2022   GAD (generalized anxiety disorder) 02/02/2022   Other specified depressive episodes 02/02/2022   Long-term current use of benzodiazepine 02/02/2022   Migraine with aura and without status migrainosus, not intractable 11/03/2020   Arrhythmia 08/06/2020   Status migrainosus 02/25/2019   Osteoporosis 04/09/2013   Vitamin D deficiency 04/09/2013   DDD (degenerative disc disease), lumbosacral 02/08/2005    ONSET DATE: 12/19/23   REFERRING DIAG: CVA- memory deficits  THERAPY DIAG:  Cognitive communication deficit  Rationale for Evaluation and Treatment Rehabilitation  SUBJECTIVE:   SUBJECTIVE STATEMENT: Pt alert, pleasant, and cooperative. Pt accompanied by: self and significant other  PERTINENT HISTORY & DIAGNOSTIC FINDINGS: Pt is 63 y.o. female who presents today for a cognitive-communication evaluation in setting of stroke. MRI 12/18/23 1. 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 No  associated hemorrhage or mass effect. PMHx as outlined above.  PAIN:  Are you having pain? No   FALLS: Has patient fallen in last 6 months?  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 Pt was able to summarize key points from chapter in biography she is reading with use of notes. Pt stated she is continuing to read aloud, summarize intermittently, write notes and review notes prior to resuming reading. Pt noted needing to summarize after each paragraph vs after whole page. Will continue to target.  Divided attention targeted utilzing Constant Therapy app. Pt completed alternating words task (Level 1) with ~90% accuracy, min/mod cues for use of attention strategies. Increased difficulty with divided attention with increased complexity    PATIENT EDUCATION: Education details: as above Person educated: Patient and Spouse Education method: Explanation Education comprehension: verbalized understanding  HOME EXERCISE PROGRAM:        Continue compensations for attention and memory (with emphasis on reading)  Divided attention worksheets      GOALS:  Goals reviewed with patient? Yes  SHORT TERM GOALS: Target date: 10 sessions  Pt will complete PROM re: memory.  Baseline: Goal status: MET   2.  Pt will endorse successful implementation of at least x2 compensations for attention and memory.  Baseline:  Goal status:  MET  3.  With Moderate A, patient will establish external aid for memory/executive function and bring to more than 50% of therapy sessions.    Baseline:  Goal status: MET  New goals - established 05/14/2024 4. Pt  will utilize compensation for attention/recall (e.g. reading aloud, notetaking) to recall and summarize details in a functional task (e.g. therapy session, reading, tv program).  Baseline:  Goal status: INITIAL   5. Pt will demonstrate alternating attention over 10 minutes between 2 mod complex cognitive-linguistic tasks >80% accuracy with modified Independence. Baseline: Goal status: INITIAL    6. Pt will identify x3 compensations for improved cognitive-communication during functional activities at home/work.   Baseline:   Goal status: INITIAL    LONG TERM GOALS: Target date: 12 weeks  Pt will endorse improvement in cognitive-communication per PROM.  Baseline:  Goal status: MET  2.  Pt and/or husband will demonstrate understanding of ways to promote and support cognitive-communication outside of SLP sessions.  Baseline:  Goal status: MET  New goal - established 05/14/24 3. Patient will report engagement in cognitive activities outside of ST 4/7 days.   Baseline:   Goal status: INITIAL  ASSESSMENT:  CLINICAL IMPRESSION:  Pt is 63 y.o. female who presents today for a cognitive-communication treatment in setting of stroke. Initial assessment completed via formal means (Cognitive-Linguistic Quick Test) and PROM (Neuro-QoL Adult Cognitive Function v2.0). Pt presents with at least mild cognitive-communication deficits affecting attention and visuospatial skills per CLQT as well as memory and executive functioning per PROM. Suspect CLQT is not as sensitive to higher level cognitive-communication deficits appreciated by pt during iADLs. Pt continues to make excellent progress; however, pt continues with impaired working memory/divided attention and short term recall. Pt planning to transition back to work in in January 2026 and pt would benefit from further compensation training and cognitive retraining to promote a successful transition back to work. Please see details of today's tx as  outlined above. Recommend skilled ST services targeting above mentioned deficits to improve QoL and performance on ADLs/IADLs.  OBJECTIVE IMPAIRMENTS include attention, memory, executive functioning, and visuospatial deficits. These impairments are limiting patient from return to work, managing appointments, household responsibilities, and ADLs/IADLs. Factors affecting potential to achieve goals and functional outcome are medical prognosis.. Patient will benefit from skilled SLP services to address above impairments and improve overall function.  REHAB POTENTIAL: Good  PLAN: SLP FREQUENCY: 1-2x/week  SLP DURATION: 12 weeks  PLANNED INTERVENTIONS: Cueing hierachy, Cognitive reorganization, Internal/external aids, Functional tasks, SLP instruction and feedback, Compensatory strategies, and Patient/family education    Delon Bangs, M.S., CCC-SLP Speech-Language Pathologist [facility] [id number] [place]ET[place])  [facility] at [facility] [address] [place], [place], [address] Phone: [phone]   Fax:  [phone]

## 2024-05-22 NOTE — Therapy (Signed)
 Occupational Therapy Neuro Treatment Note   Patient Name: Ashley Collins MRN: [id number] DOB:Mar 10, 1961, 63 y.o., female Today's Date: 05/22/2024  PCP: Harvey Gaetana CROME, NP REFERRING PROVIDER: Harvey Gaetana CROME, NP   OT End of Session - 05/22/24 1151     Visit Number 43    Number of Visits 72    Date for Recertification  08/06/24    OT Start Time 1145    OT Stop Time 1230    OT Time Calculation (min) 45 min    Activity Tolerance Patient tolerated treatment well    Behavior During Therapy WFL for tasks assessed/performed               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   Cardiac arrhythmia    Nonspecific ST T wave changes on EKG   Chronic venous insufficiency of lower extremity    Complication of anesthesia    nausea and vomiting   DDD (degenerative disc disease), lumbosacral    Essential hypertension    Headache    History of kidney stones    Hyperlipidemia    Kidney stones    Lymphedema    Migraines    Osteoporosis    PONV (postoperative nausea and vomiting)    Right ureteral stone    Vitamin B12 deficiency    Vitamin D deficiency    Past Surgical History:  Procedure Laterality Date   AUGMENTATION MAMMAPLASTY     CESAREAN SECTION     x 4   COLONOSCOPY WITH PROPOFOL  N/A 03/31/2022   Procedure: COLONOSCOPY WITH PROPOFOL ;  Surgeon: Onita Elspeth Sharper, DO;  Location: [facility] ENDOSCOPY;  Service: Gastroenterology;  Laterality: N/A;   CYSTOSCOPY W/ RETROGRADES Bilateral 07/10/2020   Procedure: CYSTOSCOPY WITH RETROGRADE PYELOGRAM;  Surgeon: Francisca Redell BROCKS, MD;  Location: [facility] ORS;  Service: Urology;  Laterality: Bilateral;   CYSTOSCOPY/URETEROSCOPY/HOLMIUM LASER/STENT PLACEMENT     CYSTOSCOPY/URETEROSCOPY/HOLMIUM LASER/STENT PLACEMENT Bilateral 07/10/2020   Procedure: CYSTOSCOPY/URETEROSCOPY/HOLMIUM LASER/STENT PLACEMENT;  Surgeon: Francisca Redell BROCKS, MD;  Location: [facility] ORS;  Service:  Urology;  Laterality: Bilateral;   CYSTOSCOPY/URETEROSCOPY/HOLMIUM LASER/STENT PLACEMENT Right 08/25/2023   Procedure: CYSTOSCOPY/URETEROSCOPY/HOLMIUM LASER;  Surgeon: Francisca Redell BROCKS, MD;  Location: [facility];  Service: Urology;  Laterality: Right;   CYSTOSCOPY/URETEROSCOPY/HOLMIUM LASER/STENT PLACEMENT Right 12/15/2023   Procedure: CYSTOSCOPY/URETEROSCOPY/HOLMIUM LASER;  Surgeon: Francisca Redell BROCKS, MD;  Location: [facility];  Service: Urology;  Laterality: Right;   EXTRACORPOREAL SHOCK WAVE LITHOTRIPSY     x 10 plus   Eye Lift     FACIAL COSMETIC SURGERY     GANGLION CYST EXCISION Right 12/21/2021   Procedure: REMOVAL GANGLION OF WRIST;  Surgeon: Kathlynn Sharper, MD;  Location: [facility] OR[facility];  Service: Orthopedics;  Laterality: Right;   LIPOSUCTION     TONSILLECTOMY     TRIGGER FINGER RELEASE Right 12/21/2021   Procedure: RELEASE TRIGGER FINGER/A-1 PULLEY;  Surgeon: Kathlynn Sharper, MD;  Location: [facility] ORS;  Service: Orthopedics;  Laterality: Right;   URETEROSCOPY WITH HOLMIUM LASER LITHOTRIPSY     Patient Active Problem List   Diagnosis Date Noted   Acute CVA (cerebrovascular accident) (HCC) 12/19/2023   Hypokalemia 12/19/2023   AKI (acute kidney injury) 12/19/2023   Leukocytosis 12/19/2023   Hyperlipidemia, unspecified 12/19/2023   Essential hypertension 12/19/2023   Vision changes 12/19/2023   Lymphedema 05/22/2023   Chronic venous insufficiency 05/22/2023   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 replacement therapy 04/10/2023  Insomnia due to medical condition 04/25/2022   GAD (generalized anxiety disorder) 02/02/2022   Other specified depressive episodes 02/02/2022   Long-term current use of benzodiazepine 02/02/2022   Migraine with aura and without status migrainosus, not intractable 11/03/2020   Arrhythmia 08/06/2020   Status migrainosus 02/25/2019   Osteoporosis 04/09/2013   Vitamin D deficiency 04/09/2013   DDD (degenerative disc disease), lumbosacral 02/08/2005   ONSET DATE:  12/18/2023  REFERRING DIAG:   THERAPY DIAG:  Muscle weakness (generalized)  Low vision, both eyes  Rationale for Evaluation and Treatment: Rehabilitation  SUBJECTIVE:  SUBJECTIVE STATEMENT:   Pt. reports that she is making a blueberry cheesecake for Thanksgiving.  Pt accompanied by: significant other  PERTINENT HISTORY: Pt. has Hx of stroke with onset 12/18/2023. Pt. PMHx includes: HTN, Hyperlipidemia, Kidney Stones, near syncopal event, loss of vision.  PRECAUTIONS: None  WEIGHT BEARING RESTRICTIONS: None  PAIN:  Are you having pain? No  FALLS: Has patient fallen in last 6 months? Yes. Number of falls    LIVING ENVIRONMENT: Lives with: lives with their family Lives in: [place], [place] Stairs: No, inside the house, yes but does not use Has following equipment at home:   PLOF: Independent  PATIENT GOALS: To be able to see  OBJECTIVE:   HAND DOMINANCE: Right  ADLs:  Eating: Drinking from straw is different, able to use utensils.  Grooming: Fatigues completing hair care.  UB Dressing: independent, if clothes are in front of her she can find clothing LB Dressing: Independent Toileting: Independent Bathing: Independent Tub Shower transfers: Independent Equipment: none Has difficulty with using cell phone  IADLs: Shopping: Does not typically go out shopping, and has not tried to. Light housekeeping: Pt. reports that she does not typically do house cleaning. Pt. has been able to unpack belongings from boxes due to her recent move in. Meal Prep: Pt. reports that is does not currently cook, however reports that she probably could. Has difficulty opening bottle caps/lids. Community mobility: Requires assistance from husband to navigate  through buildings, negotiate stairs-Pt. with increased fear of falling  Medication management: Able to push down medication bottles to open  Financial management: No change in the process-uses automatic bill pay system.  Has difficulty  with using cell phone Handwriting: N/T Work: Pt. was actively working managing a health visitor, works a lot of hours  MOBILITY STATUS: Independent and Needs Assist: Requires hand on hand assistance with nvigating through environments.   POSTURE COMMENTS:  No Significant postural limitations Sitting balance: Good  ACTIVITY TOLERANCE: Activity tolerance: Good  FUNCTIONAL OUTCOME MEASURES:   UPPER EXTREMITY ROM:    Active ROM Right Eval WFL Left Eval [facility]  Shoulder flexion    Shoulder abduction    Shoulder adduction    Shoulder extension    Shoulder internal rotation    Shoulder external rotation    Elbow flexion    Elbow extension    Wrist flexion    Wrist extension    Wrist ulnar deviation    Wrist radial deviation    Wrist pronation    Wrist supination    (Blank rows = not tested)  UPPER EXTREMITY MMT:     MMT Right eval Right 01/29/24 Right  04/04/24 Left eval Left 01/29/24 Left 04/04/24  Shoulder flexion 4-/5 5/5 5/5 4-/5 5/5 5/5  Shoulder abduction 4-/5 5/5 5/5 4-/5 5/5 5/5  Shoulder adduction        Shoulder extension        Shoulder internal rotation  Shoulder external rotation        Middle trapezius        Lower trapezius        Elbow flexion 5/5 5/5 5/5 5/5 5/5 5/5  Elbow extension 5/5 5/5 5/5 5/5 5/5 5/5  Wrist flexion 4/5 5/5 5/5 4-/5 5/5 5/5  Wrist extension 4/5 5/5 5/5 4-/5 5/5 5/5  Wrist ulnar deviation        Wrist radial deviation        Wrist pronation        Wrist supination        (Blank rows = not tested)  HAND FUNCTION: Grip strength: Right: 39 lbs; Left: 28 lbs, Lateral pinch: Right: 11 lbs, Left: 9 lbs, and 3 point pinch: Right: 9 lbs, Left: 9 lbs  01/29/24 Grip strength: Right: 39 lbs; Left: 36 lbs, Lateral pinch: Right: 14 lbs, Left: 12 lbs, and 3 point pinch: Right: 12 lbs, Left: 11 lbs  04/04/24:  Grip strength: Right: 37 lbs; Left: 42 lbs  05/14/24  Grip strength: Right: 55 lbs; Left: 47  lbs  COORDINATION:  9 Hole Peg test: Right: 30 sec; Left: 36 sec  01/29/24 9 Hole Peg test: Right: 24 sec; Left: 23 sec.  04/04/24:  9 Hole Peg test: Right: 28 sec; Left: 33 sec.  05/14/24:  9 Hole Peg test: Right: 23 sec; Left: 22 sec.  SENSATION: WFL  EDEMA:   MUSCLE TONE:   COGNITION: Overall cognitive status: Within functional limits for tasks assessed  VISION: Subjective report: Pt. reports changes in her vision at onset of stroke, starting with blurry vision, resulting in vision loss in the R eye.  Baseline vision: Pt. Is able to visually track  Visual history: Hx of eye surgery  VISION ASSESSMENT: TBD  PERCEPTION: WFL  PRAXIS: WFL  OBSERVATIONS:                                                                                                                    TREATMENT DATE: 05/22/24   Therapeutic activities:   -Facilitated visual memory, and sequencing skills working to locate  2-7 & 8 step sequential tasks at a time with focus on increasing the complexity of the task. -Facilitated visual scanning for each step of the IADL sequence card placed randomly throughout the room. Pt. required increased cues for more simple sequential scenarios. -Facilitated visual motor tasks working towards constructing simple to moderately complex vertical tabletop PCP Pipe towers following visual pattern directions. -Facilitated visual memory skills working to construct the PCP vertical pipe tower just completed without the visual pattern aid    PATIENT EDUCATION:  Pt. education was provided  pt. Functional status, goals.  HOME EXERCISE PROGRAM:  -Upgraded to blue theraputty for gross grip strengthening. -Green Theraputty exercises for  hand strengthening at bridge -Visual perceptual: Flip flop target design patterns. -Visual memory tasks -Writing tasks -visual motor tasks.  GOALS: Goals reviewed with patient? Yes  SHORT TERM GOALS: Target date: 06/25/2024   Pt.  Will  independently utilize HEP for hand strength, coordination, and visual compensatory strategies ADL/ADLs Baseline: 05/14/24: Independent 04/12/23: Independent, continue 03/06/24: Independent Eval: No  current HEP  Goal status: Ongoing   LONG TERM GOALS: Target date: 08/06/2024  Pt. Will be able to independently implement visual scanning/visual search compensatory strategies for tasks within her extra personal space navigating through community environments 100% of the time.  Baseline: 01/29/24:  Independent  100% of the time within the therapy gym, and hallway. Eval: Pt. Requires increased assist to navigate through community environments. Goal status: Achieved  2.  Pt. Will be able to independently utilize visual scanning/visual search strategies  during ADLs/IADLs within her near space, and during tabletop tasks, and tasks at a vertical plane with 100% accuracy. Baseline: 05/14/24: 90% simple to moderately complex visual scanning tasks however 75% accuracy for complex visual scanning tasks, and scanning at a vertical plane. 04/11/24: 80% simple to moderately complex visual scanning tasks however 70% accuracy for complex visual scanning tasks, and scanning at a vertical plane.  03/06/24: Initiates visual scanning 85% of the time  01/29/24: 75% Eval: Pt. Education to be provided.  Goal status: Progressing Ongoing  3.  Pt. Will be able to independently initiate visual scanning techniques in her own environment to reduce risk of falls.  Baseline: 01/29/24: Education was provided, Pt. is utlizing visual compensatory strategies/visual scanning within her home. Eval: Pt. Education to be provided.  Goal status: Achieved  4.  Pt. Will increase BUE Grip Strength by 5# to be able to independently and securely hold objects for ADL/IADL use. Baseline: 05/14/24: Grip strength: Right: 55 lbs; Left: 47 lbs Pt. Has improved with holding items within her hand without dropping them.04/11/24: Continue- recent  revision 04/04/2024: Grip strength: Right: 37 lbs; Left: 42 lbs patient is dropping items from her hands 01/29/24: Grip strength: Right: 39 lbs; Left: 28 lbs, Eval: Right Grip: 39#, Left Grip: 28# Goal status: Achieved  5.  Pt. Will increase L Lateral Pinch strength by 2# to be able to open bottles. Baseline: 05/14/24: Pt. is able to open previously opened jars/bottles. Has difficulty with unopened jars/bottles.04/04/2024: Grip strength: Right: 37 lbs; Left: 42 lbs 01/29/24: Lateral pinch: Right: 14 lbs, Left: 12 lbs Eval: Lateral pinch: Right: 11 lbs, Left: 9 lbs  Goal status: Deferred  6.  Pt. Will increase BUE strength for shoulder flexion/abduction by 2 mm grades to independently complete hair care tasks. Baseline:  03/16/2024: 5/5 01/29/24: 5/5 overall Eval: Right Shoulder Flexion: 4-/5, Left Shoulder Flexion:4-/5, Right Shoulder Abduction: 4-/5, Left Shoulder Abduction: 4-/5 Goal status: Achieved  7.  Pt will be able to independently and efficiently manipulate medication without dropping them.  Baseline: 05/14/24: 9 Hole Peg test: Right: 23 sec; Left: 22 sec. 04/11/24: Continue, recent revision 04/04/2024: 9 Hole Peg test: Right: 28 sec; Left: 33 sec. 01/29/24: Independent 01/15/24: Daughter currently manages medication set up.  Goal status: Ongoing  8. Pt will increase typing speed to 20-30 words per minute with at least 90% accuracy to work towards more efficient typing for job related  responsibilities.  Baseline: 05/14/24: Pt. Continues to present with multiple mistypes, and requires increased time.05/14/24: 04/11/24: TBD 03/06/24: 10 wpm with 92% accuracy. 01/29/24: Continue 01/15/24: 8wpm x88% accuracy=7 wpm  Goal status: Ongoing  9. Pt will be able to scan small print on store receipts to check for errors with 100% accuracy using visual compensation strategies as needed.  Baseline: 05/14/24: Pt. Is improving with efficiency of visually scanning receipts.04/11/24: Pt. Continues to present  with  difficulty with visual scanning small  small items efficiently. 03/06/24: Pt. Continues to present with difficulty with visual scanning small  small items efficiently. 01/29/24: Pt. Continues to have difficulty scanning small print items. 01/15/24: Not yet attempted; required for return to work/job responsibility  Goal status:  Ongoing  10. Pt. will independently identify 8 items consistently from visual visual memory in preparation or ADLs.    Baseline: 05/14/24: 5-7 items.04/11/24: 5-7 items  while seated at the tabletop. 03/06/24: Pt. is able to identify up to 6 picture items while seated at the tabletop. Pt. is able to consistently Identify 4 letters from visual memory when attention is divided.  01/29/24: Pt. is able to identify 6 items consistently from visual memory.    Gaol status: Ongoing      11. Pt. will be independently write 4 sentences  efficiently with 100% legibility, no deviation from writing on a blank line, and appropriate spacing between letters.              Baseline: 05/14/24: 4 lines with 80% legibility in printed form, completed in 2 min. & 16 sec. 04/11/24: Continue 03/06/24: 4 lines with 75% legibility, positive deviation below the line, and excessive spacing between the words.              Goal status: Ongoing  ASSESSMENT:  CLINICAL IMPRESSION:  Pt. requires cues for completing visual memory tasks, and recalling items efficiently from visual memory. Pt. was efficiently able to construct a simple and a moderately complex vertical PCP Pipe tower, however requires extensive cues to recall the pattern that was just completed, and to reconstruct it from visual memory. Pt. continues to benefit from Occupational Therapy services to improve her ability to use visual compensatory strategies, and improve overall BUE functioning in order to improve engagement in, and maximize overall independence with ADL, and IADL tasks.     PERFORMANCE DEFICITS: in functional skills including ADLs,  IADLs, coordination, dexterity, Fine motor control, and vision, and psychosocial skills including coping strategies, environmental adaptation, habits, interpersonal interactions, and routines and behaviors.   IMPAIRMENTS: are limiting patient from ADLs, IADLs, rest and sleep, work, leisure, and social participation.   CO-MORBIDITIES: may have co-morbidities  that affects occupational performance. Patient will benefit from skilled OT to address above impairments and improve overall function.  MODIFICATION OR ASSISTANCE TO COMPLETE EVALUATION: Min-Moderate modification of tasks or assist with assess necessary to complete an evaluation.  OT OCCUPATIONAL PROFILE AND HISTORY: Detailed assessment: Review of records and additional review of physical, cognitive, psychosocial history related to current functional performance.  CLINICAL DECISION MAKING: Moderate - several treatment options, min-mod task modification necessary  REHAB POTENTIAL: Good  EVALUATION COMPLEXITY: Moderate    PLAN:  OT FREQUENCY: 2x/week  OT DURATION: 12 weeks  PLANNED INTERVENTIONS: 97168 OT Re-evaluation, 97535 self care/ADL training, 02889 therapeutic exercise, 97530 therapeutic activity, 97112 neuromuscular re-education, visual/perceptual remediation/compensation, patient/family education, and DME and/or AE instructions  RECOMMENDED OTHER SERVICES: ST  CONSULTED AND AGREED WITH PLAN OF CARE: Patient and family member/caregiver  PLAN FOR NEXT SESSION: Treatment  Richardson Otter, MS, OTR/L  05/22/24, 12:14 PM

## 2024-05-28 ENCOUNTER — Ambulatory Visit: Admitting: Occupational Therapy

## 2024-05-28 ENCOUNTER — Ambulatory Visit: Admitting: Physical Therapy

## 2024-05-28 ENCOUNTER — Ambulatory Visit

## 2024-05-28 DIAGNOSIS — R262 Difficulty in walking, not elsewhere classified: Secondary | ICD-10-CM | POA: Insufficient documentation

## 2024-05-28 DIAGNOSIS — R278 Other lack of coordination: Secondary | ICD-10-CM | POA: Insufficient documentation

## 2024-05-28 DIAGNOSIS — M6281 Muscle weakness (generalized): Secondary | ICD-10-CM | POA: Diagnosis present

## 2024-05-28 DIAGNOSIS — H543 Unqualified visual loss, both eyes: Secondary | ICD-10-CM | POA: Diagnosis present

## 2024-05-28 DIAGNOSIS — M546 Pain in thoracic spine: Secondary | ICD-10-CM

## 2024-05-28 DIAGNOSIS — R41841 Cognitive communication deficit: Secondary | ICD-10-CM | POA: Insufficient documentation

## 2024-05-28 DIAGNOSIS — M5459 Other low back pain: Secondary | ICD-10-CM | POA: Insufficient documentation

## 2024-05-28 NOTE — Therapy (Signed)
 OUTPATIENT PHYSICAL THERAPY THORACOLUMBAR TREATMENT   Patient Name: Ashley Collins MRN: [id number] DOB:Jul 04, 1960, 63 y.o., female Today's Date: 05/28/2024  END OF SESSION:  PT End of Session - 05/28/24 0001     Visit Number 4    Number of Visits 16    Date for Recertification  06/20/24    Authorization Type --    Authorization Time Period --    Progress Note Due on Visit 10    PT Start Time 0204    PT Stop Time 0245    PT Time Calculation (min) 41 min    Equipment Utilized During Treatment Gait belt    Activity Tolerance Patient tolerated treatment well    Behavior During Therapy WFL for tasks assessed/performed           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   Cardiac arrhythmia    Nonspecific ST T wave changes on EKG   Chronic venous insufficiency of lower extremity    Complication of anesthesia    nausea and vomiting   DDD (degenerative disc disease), lumbosacral    Essential hypertension    Headache    History of kidney stones    Hyperlipidemia    Kidney stones    Lymphedema    Migraines    Osteoporosis    PONV (postoperative nausea and vomiting)    Right ureteral stone    Vitamin B12 deficiency    Vitamin D deficiency    Past Surgical History:  Procedure Laterality Date   AUGMENTATION MAMMAPLASTY     CESAREAN SECTION     x 4   COLONOSCOPY WITH PROPOFOL  N/A 03/31/2022   Procedure: COLONOSCOPY WITH PROPOFOL ;  Surgeon: Onita Elspeth Sharper, DO;  Location: [facility] ENDOSCOPY;  Service: Gastroenterology;  Laterality: N/A;   CYSTOSCOPY W/ RETROGRADES Bilateral 07/10/2020   Procedure: CYSTOSCOPY WITH RETROGRADE PYELOGRAM;  Surgeon: Francisca Redell BROCKS, MD;  Location: [facility] ORS;  Service: Urology;  Laterality: Bilateral;   CYSTOSCOPY/URETEROSCOPY/HOLMIUM LASER/STENT PLACEMENT     CYSTOSCOPY/URETEROSCOPY/HOLMIUM LASER/STENT PLACEMENT Bilateral 07/10/2020   Procedure: CYSTOSCOPY/URETEROSCOPY/HOLMIUM  LASER/STENT PLACEMENT;  Surgeon: Francisca Redell BROCKS, MD;  Location: [facility] ORS;  Service: Urology;  Laterality: Bilateral;   CYSTOSCOPY/URETEROSCOPY/HOLMIUM LASER/STENT PLACEMENT Right 08/25/2023   Procedure: CYSTOSCOPY/URETEROSCOPY/HOLMIUM LASER;  Surgeon: Francisca Redell BROCKS, MD;  Location: [facility] ORS;  Service: Urology;  Laterality: Right;   CYSTOSCOPY/URETEROSCOPY/HOLMIUM LASER/STENT PLACEMENT Right 12/15/2023   Procedure: CYSTOSCOPY/URETEROSCOPY/HOLMIUM LASER;  Surgeon: Francisca Redell BROCKS, MD;  Location: [facility] ORS;  Service: Urology;  Laterality: Right;   EXTRACORPOREAL SHOCK WAVE LITHOTRIPSY     x 10 plus   Eye Lift     FACIAL COSMETIC SURGERY     GANGLION CYST EXCISION Right 12/21/2021   Procedure: REMOVAL GANGLION OF WRIST;  Surgeon: Kathlynn Sharper, MD;  Location: [facility] ORS;  Service: Orthopedics;  Laterality: Right;   LIPOSUCTION     TONSILLECTOMY     TRIGGER FINGER RELEASE Right 12/21/2021   Procedure: RELEASE TRIGGER FINGER/A-1 PULLEY;  Surgeon: Kathlynn Sharper, MD;  Location: [facility] ORS;  Service: Orthopedics;  Laterality: Right;   URETEROSCOPY WITH HOLMIUM LASER LITHOTRIPSY     Patient Active Problem List   Diagnosis Date Noted   Acute CVA (cerebrovascular accident) (HCC) 12/19/2023   Hypokalemia 12/19/2023   AKI (acute kidney injury) 12/19/2023   Leukocytosis 12/19/2023   Hyperlipidemia, unspecified 12/19/2023   Essential hypertension 12/19/2023   Vision changes 12/19/2023   Lymphedema  05/22/2023   Chronic venous insufficiency 05/22/2023   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 replacement therapy 04/10/2023   Insomnia due to medical condition 04/25/2022   GAD (generalized anxiety disorder) 02/02/2022   Other specified depressive episodes 02/02/2022   Long-term current use of benzodiazepine 02/02/2022   Migraine with aura and without status migrainosus, not intractable 11/03/2020   Arrhythmia 08/06/2020   Status migrainosus 02/25/2019   Osteoporosis 04/09/2013   Vitamin D deficiency 04/09/2013    DDD (degenerative disc disease), lumbosacral 02/08/2005    PCP: Harvey Gaetana CROME, NP   REFERRING PROVIDER: Meeler, Benton CROME, FNP   REFERRING DIAG: M54.16 (ICD-10-CM) - Lumbar radiculitis   Rationale for Evaluation and Treatment: Rehabilitation  THERAPY DIAG:  Muscle weakness (generalized)  Other low back pain  Difficulty in walking, not elsewhere classified  Pain in thoracic spine  ONSET DATE: 1.5 months   SUBJECTIVE:                                                                                                                                                                                           SUBJECTIVE STATEMENT: Pt reports that she is doing okay this afternoon. Has been off from PT for roughly a month due scheduling issues.  States that she tried turning her mattress to see if that offered som relief without much success. Reports that she is still having some issues in the mid back. Rates ache in mid back 6/10  From Eval: Pt here to work on pain and discomfort in her mid back. Pt reports the more she is on her feet the more her back hurts and relief comes from being supine. Pt has not had any previous therapy for her back pain. Pt has epidural that assists with her pain below her belt line. Pt still in OT working on her upper extremity and grip strength. Pt has epidural shots for last 3 years. Pt initially hurt her back lifting something to put in her car. Patient can stand for maybe 30 min at a time before pain is no longer tolerable. She reports that sitting down does not improve her pain but lying on her side in would bring relief. Pt has history of osteoporosis and sliped disc in her back. Pt pain in this area is similar to pain she gets epidural for but just further superior into her thoracic spine.    PERTINENT HISTORY:  Patient familiar to this clinic before.  Was previously seen following CVA for mobility and balance deficits.  Still seeing speech and  language pathologist and occupational therapy.  PAIN:  Are you having pain? Yes: NPRS scale: 3-10 ( at  end of day everyday) Pain location: above belt line and radiating laterally  Pain description: nagging, throbbing pain  Aggravating factors: Standing Relieving factors: Lying down  PRECAUTIONS: None  RED FLAGS: None   WEIGHT BEARING RESTRICTIONS: No  FALLS:  Has patient fallen in last 6 months? No  LIVING ENVIRONMENT: Lives with: lives with SO Lives in: House/apartment Stairs: No Has following equipment at home: None  OCCUPATION: Merchandiser, Retail   PLOF: Independent and Independent with basic ADLs  PATIENT GOALS: improve back pain   NEXT MD VISIT: Not scheduled   OBJECTIVE:   Note: Objective measures were completed at Evaluation unless otherwise noted.  DIAGNOSTIC FINDINGS:  No recent images since to 2023 were mild degenerative changes in the lumbar spine were noted  PATIENT SURVEYS:  Modified Oswestry:  MODIFIED OSWESTRY DISABILITY SCALE  Date: 04/25/24 Score  Pain intensity 5 =  Pain medication has no effect on my pain.  2. Personal care (washing, dressing, etc.) 0 =  I can take care of myself normally without causing increased pain.  3. Lifting 5 =  I cannot lift or carry anything at all.  4. Walking 3 =  Pain prevents me from walking more than  mile.  5. Sitting 3 =  Pain prevents me from sitting more than  hour.  6. Standing 2 =  Pain prevents me from standing more than 1 hour  7. Sleeping 1 = I can sleep well only by using pain medication.  8. Social Life 2 = Pain prevents me from participating in more energetic activities (eg. sports, dancing).  9. Traveling 2 =  My pain restricts my travel over 2 hours.  10. Employment/ Homemaking 3 = Pain prevents me from doing anything but light duties.  Total 26/50   Interpretation of scores: Score Category Description  0-20% Minimal Disability The patient can cope with most living activities. Usually no  treatment is indicated apart from advice on lifting, sitting and exercise  21-40% Moderate Disability The patient experiences more pain and difficulty with sitting, lifting and standing. Travel and social life are more difficult and they may be disabled from work. Personal care, sexual activity and sleeping are not grossly affected, and the patient can usually be managed by conservative means  41-60% Severe Disability Pain remains the main problem in this group, but activities of daily living are affected. These patients require a detailed investigation  61-80% Crippled Back pain impinges on all aspects of the patient's life. Positive intervention is required  81-100% Bed-bound These patients are either bed-bound or exaggerating their symptoms  Bluford FORBES Zoe DELENA Karon DELENA, et al. Surgery versus conservative management of stable thoracolumbar fracture: the PRESTO feasibility RCT. [place] ([place]): [employer]; 2021 Nov. [facility] Technology Assessment, No. 25.62.) Appendix 3, Oswestry Disability Index category descriptors. Available from: Findjewelers.cz  Minimally Clinically Important Difference (MCID) = 12.8%  COGNITION: Overall cognitive status: Within functional limits for tasks assessed     SENSATION: WFL  MUSCLE LENGTH: Hamstrings: WFL during DLLT   POSTURE: rounded shoulders  PALPATION: Tender to palpation along lumbar paraspinals, origin of gluteal muscles, and some along thoracic paraspinals.  Patient had most pain along lumbar paraspinals and gluteal musculature on the left greater than right side  LUMBAR ROM:   AROM eval  Flexion WNL  Extension Min - tight   Right lateral flexion To knee- tight  Left lateral flexion To knee - tight   Right rotation WNL- tight   Left rotation WNL tight    (  Blank rows = not tested)  LOWER EXTREMITY ROM:    Further hip range of motion may be beneficial  LOWER EXTREMITY MMT:    MMT Right eval  Left eval  Hip flexion 4 4  Hip extension    Hip abduction 4+ 4+  Hip adduction 5 5  Hip internal rotation    Hip external rotation    Knee flexion 5 5  Knee extension 5 5  Ankle dorsiflexion 5 5  Ankle plantarflexion    Ankle inversion    Ankle eversion     (Blank rows = not tested) Measured in seated position   LUMBAR SPECIAL TESTS:  DLLT: could not sustain past 60 degrees  FUNCTIONAL TESTS:     TREATMENT DATE: 05/28/24   TE- To improve strength, endurance, mobility, and function of specific targeted muscle groups or improve joint range of motion or improve muscle flexibility  Nustep B UE and LE movement training with MHP on lower back throughout x6 min L 3-6 - reports pain improvement with activity   Supine TrA contraction 3 sec hold   x 15 - instruction to make maneuver like getting into tight dress -tactile cues with 2 fingers from each hand above ASIS.   LTR x 15 bil with 3 sec hold  Modified dead bug for isometric hip flexion into contralateral UE x 15 bil with 3 sec hold   Open book x 12 bil with 2-3 sec hold.   Manual therapy   STM to Bil thoracic paraspinals R> L, bil QL L>R, x 10 min  Grade 2-3 thoracic CPA mobilizations, 30 sec per segment T4-T12     PATIENT EDUCATION: Education details: POC Person educated: Patient Education method: Explanation Education comprehension: verbalized understanding   HOME EXERCISE PROGRAM: Access Code: [email or web address] URL: [email or web address] Date: 04/30/2024 Prepared by: Lonni Gainer  Exercises - Clamshell  - 1 x daily - 7 x weekly - 3 sets - 10 reps - Bird Dog Progression  - 1 x daily - 7 x weekly - 1 sets - 10 reps - Sidelying Thoracic Rotation with Open Book  - 1 x daily - 7 x weekly - 1 sets - 10 reps - 5 sec hold   ASSESSMENT:  CLINICAL IMPRESSION:  Patient arrived with good motivation for completion of pt activities. Returns to PT after ~ 1 month hiatus due to scheduling conflict Pt  responds well to nustep with heat reporting improved pain levels in back following. Pt had significant trigger points in R side paraspinals and L side QL. Management with STM. Also noted to have significantly reduced joint mobility in tspine. Pt responding well to interventions and may progress next session if pt continues to have improved pain levels. Pt will continue to benefit from skilled physical therapy intervention to address impairments, improve QOL, and attain therapy goals.   OBJECTIVE IMPAIRMENTS: Abnormal gait.   ACTIVITY LIMITATIONS: carrying, standing, squatting, and locomotion level  PARTICIPATION LIMITATIONS: meal prep, cleaning, shopping, community activity, and occupation  PERSONAL FACTORS: Past/current experiences, Time since onset of injury/illness/exacerbation, and 3+ comorbidities: HTN, HLD, osteoporosis, B12 deficiency are also affecting patient's functional outcome.   REHAB POTENTIAL: Good  CLINICAL DECISION MAKING: Stable/uncomplicated  EVALUATION COMPLEXITY: Low   GOALS: Goals reviewed with patient? Yes  SHORT TERM GOALS: Target date: 05/23/2024       Patient will be independent in home exercise program to improve strength/mobility for better functional independence with ADLs. Baseline: No HEP currently  Goal status: INITIAL   LONG TERM  GOALS: Target date: 06/19/2024    Patient will improve modified Oswestry pain disability questionnaire by 10 points or greater in order to indicate improvement in her subjective level of back pain as well as her ability to complete functional tasks without pain limitations Baseline: 26 Goal status: INITIAL  2.  Patient will improve double leg limb lowering test from 60 degrees to 30 degrees or greater showing good core control throughout in order to indicate improved strength of anterior core muscles for improved lumbar stability Baseline: 60 degrees patient loses control and has some discomfort Goal status:  INITIAL  3.  Patient will report ability to stand for greater than 1 hour without limitations of pain over to allow her to return to work-related tasks as well as shopping related tasks with less limitation Baseline: When patient stands for 30 minutes per her report she starts to get intense pain that is only relieved by lying down Goal status: INITIAL  4.  Patient report ability to lift light objects from the floor and set them on an object such as a table in order without increase in back pain to show improved functional capacity as well as improved ability to return to work-related tasks Baseline: Patient reports she cannot lift or carry anything at all  Goal status: INITIAL    PLAN:  PT FREQUENCY: 2x/week  PT DURATION: 8 weeks  PLANNED INTERVENTIONS: 97750- Physical Performance Testing, 97110-Therapeutic exercises, 97530- Therapeutic activity, [id number]- Neuromuscular re-education, 97535- Self Care, [id number]- Manual therapy, [id number]- Gait training, [phone] (1-2 muscles), [id number] (3+ muscles)- Dry Needling, Patient/Family education, Balance training, Stair training, and Moist heat.  PLAN FOR NEXT SESSION:   hip assessment, STM or IASTM to paraspinals, QL and/ or gluteal origin Standing endurance - Standing side plank and bird dog on wall    Massie FORBES Dollar, PT 05/28/2024, 2:02 PM

## 2024-05-28 NOTE — Therapy (Signed)
 Occupational Therapy Neuro Treatment Note   Patient Name: Ashley Collins MRN: [id number] DOB:04-10-1961, 63 y.o., female Today's Date: 05/28/2024  PCP: Harvey Gaetana CROME, NP REFERRING PROVIDER: Harvey Gaetana CROME, NP   OT End of Session - 05/28/24 1604     Visit Number 44    Number of Visits 72    Date for Recertification  08/06/24    OT Start Time 1530    OT Stop Time 1615    OT Time Calculation (min) 45 min    Activity Tolerance Patient tolerated treatment well    Behavior During Therapy WFL for tasks assessed/performed               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   Cardiac arrhythmia    Nonspecific ST T wave changes on EKG   Chronic venous insufficiency of lower extremity    Complication of anesthesia    nausea and vomiting   DDD (degenerative disc disease), lumbosacral    Essential hypertension    Headache    History of kidney stones    Hyperlipidemia    Kidney stones    Lymphedema    Migraines    Osteoporosis    PONV (postoperative nausea and vomiting)    Right ureteral stone    Vitamin B12 deficiency    Vitamin D deficiency    Past Surgical History:  Procedure Laterality Date   AUGMENTATION MAMMAPLASTY     CESAREAN SECTION     x 4   COLONOSCOPY WITH PROPOFOL  N/A 03/31/2022   Procedure: COLONOSCOPY WITH PROPOFOL ;  Surgeon: Onita Elspeth Sharper, DO;  Location: [facility] ENDOSCOPY;  Service: Gastroenterology;  Laterality: N/A;   CYSTOSCOPY W/ RETROGRADES Bilateral 07/10/2020   Procedure: CYSTOSCOPY WITH RETROGRADE PYELOGRAM;  Surgeon: Francisca Redell BROCKS, MD;  Location: [facility] ORS;  Service: Urology;  Laterality: Bilateral;   CYSTOSCOPY/URETEROSCOPY/HOLMIUM LASER/STENT PLACEMENT     CYSTOSCOPY/URETEROSCOPY/HOLMIUM LASER/STENT PLACEMENT Bilateral 07/10/2020   Procedure: CYSTOSCOPY/URETEROSCOPY/HOLMIUM LASER/STENT PLACEMENT;  Surgeon: Francisca Redell BROCKS, MD;  Location: [facility] ORS;  Service:  Urology;  Laterality: Bilateral;   CYSTOSCOPY/URETEROSCOPY/HOLMIUM LASER/STENT PLACEMENT Right 08/25/2023   Procedure: CYSTOSCOPY/URETEROSCOPY/HOLMIUM LASER;  Surgeon: Francisca Redell BROCKS, MD;  Location: [facility] ORS;  Service: Urology;  Laterality: Right;   CYSTOSCOPY/URETEROSCOPY/HOLMIUM LASER/STENT PLACEMENT Right 12/15/2023   Procedure: CYSTOSCOPY/URETEROSCOPY/HOLMIUM LASER;  Surgeon: Francisca Redell BROCKS, MD;  Location: [facility] ORS;  Service: Urology;  Laterality: Right;   EXTRACORPOREAL SHOCK WAVE LITHOTRIPSY     x 10 plus   Eye Lift     FACIAL COSMETIC SURGERY     GANGLION CYST EXCISION Right 12/21/2021   Procedure: REMOVAL GANGLION OF WRIST;  Surgeon: Kathlynn Sharper, MD;  Location: [facility] ORS;  Service: Orthopedics;  Laterality: Right;   LIPOSUCTION     TONSILLECTOMY     TRIGGER FINGER RELEASE Right 12/21/2021   Procedure: RELEASE TRIGGER FINGER/A-1 PULLEY;  Surgeon: Kathlynn Sharper, MD;  Location: [facility] ORS;  Service: Orthopedics;  Laterality: Right;   URETEROSCOPY WITH HOLMIUM LASER LITHOTRIPSY     Patient Active Problem List   Diagnosis Date Noted   Acute CVA (cerebrovascular accident) (HCC) 12/19/2023   Hypokalemia 12/19/2023   AKI (acute kidney injury) 12/19/2023   Leukocytosis 12/19/2023   Hyperlipidemia, unspecified 12/19/2023   Essential hypertension 12/19/2023   Vision changes 12/19/2023   Lymphedema 05/22/2023   Chronic venous insufficiency 05/22/2023   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 replacement therapy 04/10/2023  Insomnia due to medical condition 04/25/2022   GAD (generalized anxiety disorder) 02/02/2022   Other specified depressive episodes 02/02/2022   Long-term current use of benzodiazepine 02/02/2022   Migraine with aura and without status migrainosus, not intractable 11/03/2020   Arrhythmia 08/06/2020   Status migrainosus 02/25/2019   Osteoporosis 04/09/2013   Vitamin D deficiency 04/09/2013   DDD (degenerative disc disease), lumbosacral 02/08/2005   ONSET DATE:  12/18/2023  REFERRING DIAG:   THERAPY DIAG:  Muscle weakness (generalized)  Rationale for Evaluation and Treatment: Rehabilitation  SUBJECTIVE:  SUBJECTIVE STATEMENT:   Pt. Reports having had a nice Thanksgiving  Pt accompanied by: significant other  PERTINENT HISTORY: Pt. has Hx of stroke with onset 12/18/2023. Pt. PMHx includes: HTN, Hyperlipidemia, Kidney Stones, near syncopal event, loss of vision.  PRECAUTIONS: None  WEIGHT BEARING RESTRICTIONS: None  PAIN:  Are you having pain? No  FALLS: Has patient fallen in last 6 months? Yes. Number of falls    LIVING ENVIRONMENT: Lives with: lives with their family Lives in: [place], [place] Stairs: No, inside the house, yes but does not use Has following equipment at home:   PLOF: Independent  PATIENT GOALS: To be able to see  OBJECTIVE:   HAND DOMINANCE: Right  ADLs:  Eating: Drinking from straw is different, able to use utensils.  Grooming: Fatigues completing hair care.  UB Dressing: independent, if clothes are in front of her she can find clothing LB Dressing: Independent Toileting: Independent Bathing: Independent Tub Shower transfers: Independent Equipment: none Has difficulty with using cell phone  IADLs: Shopping: Does not typically go out shopping, and has not tried to. Light housekeeping: Pt. reports that she does not typically do house cleaning. Pt. has been able to unpack belongings from boxes due to her recent move in. Meal Prep: Pt. reports that is does not currently cook, however reports that she probably could. Has difficulty opening bottle caps/lids. Community mobility: Requires assistance from husband to navigate  through buildings, negotiate stairs-Pt. with increased fear of falling  Medication management: Able to push down medication bottles to open  Financial management: No change in the process-uses automatic bill pay system.  Has difficulty with using cell phone Handwriting: N/T Work: Pt. was  actively working managing a health visitor, works a lot of hours  MOBILITY STATUS: Independent and Needs Assist: Requires hand on hand assistance with nvigating through environments.   POSTURE COMMENTS:  No Significant postural limitations Sitting balance: Good  ACTIVITY TOLERANCE: Activity tolerance: Good  FUNCTIONAL OUTCOME MEASURES:   UPPER EXTREMITY ROM:    Active ROM Right Eval WFL Left Eval [facility]  Shoulder flexion    Shoulder abduction    Shoulder adduction    Shoulder extension    Shoulder internal rotation    Shoulder external rotation    Elbow flexion    Elbow extension    Wrist flexion    Wrist extension    Wrist ulnar deviation    Wrist radial deviation    Wrist pronation    Wrist supination    (Blank rows = not tested)  UPPER EXTREMITY MMT:     MMT Right eval Right 01/29/24 Right  04/04/24 Left eval Left 01/29/24 Left 04/04/24  Shoulder flexion 4-/5 5/5 5/5 4-/5 5/5 5/5  Shoulder abduction 4-/5 5/5 5/5 4-/5 5/5 5/5  Shoulder adduction        Shoulder extension        Shoulder internal rotation        Shoulder external rotation  Middle trapezius        Lower trapezius        Elbow flexion 5/5 5/5 5/5 5/5 5/5 5/5  Elbow extension 5/5 5/5 5/5 5/5 5/5 5/5  Wrist flexion 4/5 5/5 5/5 4-/5 5/5 5/5  Wrist extension 4/5 5/5 5/5 4-/5 5/5 5/5  Wrist ulnar deviation        Wrist radial deviation        Wrist pronation        Wrist supination        (Blank rows = not tested)  HAND FUNCTION: Grip strength: Right: 39 lbs; Left: 28 lbs, Lateral pinch: Right: 11 lbs, Left: 9 lbs, and 3 point pinch: Right: 9 lbs, Left: 9 lbs  01/29/24 Grip strength: Right: 39 lbs; Left: 36 lbs, Lateral pinch: Right: 14 lbs, Left: 12 lbs, and 3 point pinch: Right: 12 lbs, Left: 11 lbs  04/04/24:  Grip strength: Right: 37 lbs; Left: 42 lbs  05/14/24  Grip strength: Right: 55 lbs; Left: 47 lbs  COORDINATION:  9 Hole Peg test: Right: 30 sec; Left: 36  sec  01/29/24 9 Hole Peg test: Right: 24 sec; Left: 23 sec.  04/04/24:  9 Hole Peg test: Right: 28 sec; Left: 33 sec.  05/14/24:  9 Hole Peg test: Right: 23 sec; Left: 22 sec.  SENSATION: WFL  EDEMA:   MUSCLE TONE:   COGNITION: Overall cognitive status: Within functional limits for tasks assessed  VISION: Subjective report: Pt. reports changes in her vision at onset of stroke, starting with blurry vision, resulting in vision loss in the R eye.  Baseline vision: Pt. Is able to visually track  Visual history: Hx of eye surgery  VISION ASSESSMENT: TBD  PERCEPTION: WFL  PRAXIS: WFL  OBSERVATIONS:                                                                                                                    TREATMENT DATE: 05/28/24   Therapeutic activities:   -Facilitated visual scanning tasks for multiple items at a time.  -Facilitated visual memory skills working to recall the items completed during the visual memory task. -Promoted focus on attention to detail with reviewing her work for accuracy, and counting the number of items in each category.   PATIENT EDUCATION:  Pt. education was provided Pt. functional status, goals.  HOME EXERCISE PROGRAM:  -Upgraded to blue theraputty for gross grip strengthening. -Green Theraputty exercises for  hand strengthening at bridge -Visual perceptual: Flip flop target design patterns. -Visual memory tasks -Writing tasks -visual motor tasks.  GOALS: Goals reviewed with patient? Yes  SHORT TERM GOALS: Target date: 06/25/2024   Pt. Will independently utilize HEP for hand strength, coordination, and visual compensatory strategies ADL/ADLs Baseline: 05/14/24: Independent 04/12/23: Independent, continue 03/06/24: Independent Eval: No  current HEP  Goal status: Ongoing   LONG TERM GOALS: Target date: 08/06/2024  Pt. Will be able to independently implement visual scanning/visual search compensatory strategies for tasks  within her extra personal space  navigating through community environments 100% of the time.  Baseline: 01/29/24:  Independent  100% of the time within the therapy gym, and hallway. Eval: Pt. Requires increased assist to navigate through community environments. Goal status: Achieved  2.  Pt. Will be able to independently utilize visual scanning/visual search strategies  during ADLs/IADLs within her near space, and during tabletop tasks, and tasks at a vertical plane with 100% accuracy. Baseline: 05/14/24: 90% simple to moderately complex visual scanning tasks however 75% accuracy for complex visual scanning tasks, and scanning at a vertical plane. 04/11/24: 80% simple to moderately complex visual scanning tasks however 70% accuracy for complex visual scanning tasks, and scanning at a vertical plane.  03/06/24: Initiates visual scanning 85% of the time  01/29/24: 75% Eval: Pt. Education to be provided.  Goal status: Progressing Ongoing  3.  Pt. Will be able to independently initiate visual scanning techniques in her own environment to reduce risk of falls.  Baseline: 01/29/24: Education was provided, Pt. is utlizing visual compensatory strategies/visual scanning within her home. Eval: Pt. Education to be provided.  Goal status: Achieved  4.  Pt. Will increase BUE Grip Strength by 5# to be able to independently and securely hold objects for ADL/IADL use. Baseline: 05/14/24: Grip strength: Right: 55 lbs; Left: 47 lbs Pt. Has improved with holding items within her hand without dropping them.04/11/24: Continue- recent revision 04/04/2024: Grip strength: Right: 37 lbs; Left: 42 lbs patient is dropping items from her hands 01/29/24: Grip strength: Right: 39 lbs; Left: 28 lbs, Eval: Right Grip: 39#, Left Grip: 28# Goal status: Achieved  5.  Pt. Will increase L Lateral Pinch strength by 2# to be able to open bottles. Baseline: 05/14/24: Pt. is able to open previously opened jars/bottles. Has difficulty with  unopened jars/bottles.04/04/2024: Grip strength: Right: 37 lbs; Left: 42 lbs 01/29/24: Lateral pinch: Right: 14 lbs, Left: 12 lbs Eval: Lateral pinch: Right: 11 lbs, Left: 9 lbs  Goal status: Deferred  6.  Pt. Will increase BUE strength for shoulder flexion/abduction by 2 mm grades to independently complete hair care tasks. Baseline:  03/16/2024: 5/5 01/29/24: 5/5 overall Eval: Right Shoulder Flexion: 4-/5, Left Shoulder Flexion:4-/5, Right Shoulder Abduction: 4-/5, Left Shoulder Abduction: 4-/5 Goal status: Achieved  7.  Pt will be able to independently and efficiently manipulate medication without dropping them.  Baseline: 05/14/24: 9 Hole Peg test: Right: 23 sec; Left: 22 sec. 04/11/24: Continue, recent revision 04/04/2024: 9 Hole Peg test: Right: 28 sec; Left: 33 sec. 01/29/24: Independent 01/15/24: Daughter currently manages medication set up.  Goal status: Ongoing  8. Pt will increase typing speed to 20-30 words per minute with at least 90% accuracy to work towards more efficient typing for job related  responsibilities.  Baseline: 05/14/24: Pt. Continues to present with multiple mistypes, and requires increased time.05/14/24: 04/11/24: TBD 03/06/24: 10 wpm with 92% accuracy. 01/29/24: Continue 01/15/24: 8wpm x88% accuracy=7 wpm  Goal status: Ongoing  9. Pt will be able to scan small print on store receipts to check for errors with 100% accuracy using visual compensation strategies as needed.  Baseline: 05/14/24: Pt. Is improving with efficiency of visually scanning receipts.04/11/24: Pt. Continues to present with difficulty with visual scanning small  small items efficiently. 03/06/24: Pt. Continues to present with difficulty with visual scanning small  small items efficiently. 01/29/24: Pt. Continues to have difficulty scanning small print items. 01/15/24: Not yet attempted; required for return to work/job responsibility  Goal status:  Ongoing  10. Pt. will independently identify 8  items consistently  from visual visual memory in preparation or ADLs.    Baseline: 05/14/24: 5-7 items.04/11/24: 5-7 items  while seated at the tabletop. 03/06/24: Pt. is able to identify up to 6 picture items while seated at the tabletop. Pt. is able to consistently Identify 4 letters from visual memory when attention is divided.  01/29/24: Pt. is able to identify 6 items consistently from visual memory.    Gaol status: Ongoing      11. Pt. will be independently write 4 sentences  efficiently with 100% legibility, no deviation from writing on a blank line, and appropriate spacing between letters.              Baseline: 05/14/24: 4 lines with 80% legibility in printed form, completed in 2 min. & 16 sec. 04/11/24: Continue 03/06/24: 4 lines with 75% legibility, positive deviation below the line, and excessive spacing between the words.              Goal status: Ongoing  ASSESSMENT:  CLINICAL IMPRESSION:  Pt. was able to visually scan for multiple items at once on a standard 8.5x11 page with 100% accuracy. Pt. however, had difficulty recalling the items from visual memory after completing the task, as well as some inaccuracies when reviewing and counting her items. Pt. required increased time to complete the task. Pt. required cues for accuracy with counting the number of items completed during the visual scanning task. Pt. continues to benefit from Occupational Therapy services to improve her ability to use visual compensatory strategies, and improve overall BUE functioning in order to improve engagement in, and maximize overall independence with ADL, and IADL tasks.     PERFORMANCE DEFICITS: in functional skills including ADLs, IADLs, coordination, dexterity, Fine motor control, and vision, and psychosocial skills including coping strategies, environmental adaptation, habits, interpersonal interactions, and routines and behaviors.   IMPAIRMENTS: are limiting patient from ADLs, IADLs, rest and sleep, work, leisure, and  social participation.   CO-MORBIDITIES: may have co-morbidities  that affects occupational performance. Patient will benefit from skilled OT to address above impairments and improve overall function.  MODIFICATION OR ASSISTANCE TO COMPLETE EVALUATION: Min-Moderate modification of tasks or assist with assess necessary to complete an evaluation.  OT OCCUPATIONAL PROFILE AND HISTORY: Detailed assessment: Review of records and additional review of physical, cognitive, psychosocial history related to current functional performance.  CLINICAL DECISION MAKING: Moderate - several treatment options, min-mod task modification necessary  REHAB POTENTIAL: Good  EVALUATION COMPLEXITY: Moderate    PLAN:  OT FREQUENCY: 2x/week  OT DURATION: 12 weeks  PLANNED INTERVENTIONS: 97168 OT Re-evaluation, 97535 self care/ADL training, 02889 therapeutic exercise, 97530 therapeutic activity, 97112 neuromuscular re-education, visual/perceptual remediation/compensation, patient/family education, and DME and/or AE instructions  RECOMMENDED OTHER SERVICES: ST  CONSULTED AND AGREED WITH PLAN OF CARE: Patient and family member/caregiver  PLAN FOR NEXT SESSION: Treatment  Richardson Otter, MS, OTR/L  05/28/24, 4:06 PM

## 2024-05-28 NOTE — Therapy (Signed)
 OUTPATIENT SPEECH LANGUAGE PATHOLOGY  TREATMENT   Patient Name: Ashley Collins MRN: [id number] DOB:Jun 02, 1961, 63 y.o., female Today's Date: 05/28/2024  PCP: Gaetana Haddock, NP  REFERRING PROVIDER: same     End of Session - 05/28/24 1406     Visit Number 26    Number of Visits 47    Date for Recertification  08/06/24    SLP Start Time 1445    SLP Stop Time  1530    SLP Time Calculation (min) 45 min    Activity Tolerance Patient tolerated treatment well           Patient Active Problem List   Diagnosis Date Noted   Acute CVA (cerebrovascular accident) (HCC) 12/19/2023   Hypokalemia 12/19/2023   AKI (acute kidney injury) 12/19/2023   Leukocytosis 12/19/2023   Hyperlipidemia, unspecified 12/19/2023   Essential hypertension 12/19/2023   Vision changes 12/19/2023   Lymphedema 05/22/2023   Chronic venous insufficiency 05/22/2023   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 replacement therapy 04/10/2023   Insomnia due to medical condition 04/25/2022   GAD (generalized anxiety disorder) 02/02/2022   Other specified depressive episodes 02/02/2022   Long-term current use of benzodiazepine 02/02/2022   Migraine with aura and without status migrainosus, not intractable 11/03/2020   Arrhythmia 08/06/2020   Status migrainosus 02/25/2019   Osteoporosis 04/09/2013   Vitamin D deficiency 04/09/2013   DDD (degenerative disc disease), lumbosacral 02/08/2005    ONSET DATE: 12/19/23   REFERRING DIAG: CVA- memory deficits  THERAPY DIAG:  Cognitive communication deficit  Rationale for Evaluation and Treatment Rehabilitation  SUBJECTIVE:   SUBJECTIVE STATEMENT: Pt alert, pleasant, and cooperative. Pt accompanied by: self and significant other  PERTINENT HISTORY & DIAGNOSTIC FINDINGS: Pt is 63 y.o. female who presents today for a cognitive-communication evaluation in setting of stroke. MRI 12/18/23 1. 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 No  associated hemorrhage or mass effect. PMHx as outlined above.  PAIN:  Are you having pain? No   FALLS: Has patient fallen in last 6 months?  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 Pt was able to summarize key points from chapter in biography she is reading with use of notes. Pt stated she is continuing to read aloud, summarize intermittently, write notes and review notes prior to resuming reading. Pt noted needing to summarize after each paragraph vs after whole page. Pt noted difficulty with underlining, suggested use of index card for straighter lines. Pt reports reduced reading stamina, suggested moving to earlier in the day as pt tends to read in evening. Will continue to target.  Reviewed divided attention worksheets, pt completed for HEP with ~90% accuracy. Extra time and self-correction noted.     PATIENT EDUCATION: Education details: as above Person educated: Patient and Spouse Education method: Explanation Education comprehension: verbalized understanding  HOME EXERCISE PROGRAM:        Continue compensations for attention and memory (with emphasis on reading)       GOALS:  Goals reviewed with patient? Yes  SHORT TERM GOALS: Target date: 10 sessions  Pt will complete PROM re: memory.  Baseline: Goal status: MET   2.  Pt will endorse successful implementation  of at least x2 compensations for attention and memory.  Baseline:  Goal status: MET  3.  With Moderate A, patient will establish external aid for memory/executive function and bring to more than 50% of therapy sessions.    Baseline:  Goal status:  MET  New goals - established 05/14/2024 4. Pt will utilize compensation for attention/recall (e.g. reading aloud, notetaking) to recall and summarize details in a functional task (e.g. therapy session, reading, tv program).  Baseline:  Goal status: INITIAL   5. Pt will demonstrate alternating attention over 10 minutes between 2 mod complex cognitive-linguistic tasks >80% accuracy with modified Independence. Baseline: Goal status: INITIAL    6. Pt will identify x3 compensations for improved cognitive-communication during functional activities at home/work.   Baseline:   Goal status: INITIAL    LONG TERM GOALS: Target date: 12 weeks  Pt will endorse improvement in cognitive-communication per PROM.  Baseline:  Goal status: MET  2.  Pt and/or husband will demonstrate understanding of ways to promote and support cognitive-communication outside of SLP sessions.  Baseline:  Goal status: MET  New goal - established 05/14/24 3. Patient will report engagement in cognitive activities outside of ST 4/7 days.   Baseline:   Goal status: INITIAL  ASSESSMENT:  CLINICAL IMPRESSION:  Pt is 63 y.o. female who presents today for a cognitive-communication treatment in setting of stroke. Initial assessment completed via formal means (Cognitive-Linguistic Quick Test) and PROM (Neuro-QoL Adult Cognitive Function v2.0). Pt presents with at least mild cognitive-communication deficits affecting attention and visuospatial skills per CLQT as well as memory and executive functioning per PROM. Suspect CLQT is not as sensitive to higher level cognitive-communication deficits appreciated by pt during iADLs. Pt continues to make excellent progress; however, pt continues with impaired working memory/divided attention and short term recall. Pt planning to transition back to work in in January 2026 and pt would benefit from further compensation training and cognitive retraining to promote a successful transition  back to work. Please see details of today's tx as outlined above. Recommend skilled ST services targeting above mentioned deficits to improve QoL and performance on ADLs/IADLs.  OBJECTIVE IMPAIRMENTS include attention, memory, executive functioning, and visuospatial deficits. These impairments are limiting patient from return to work, managing appointments, household responsibilities, and ADLs/IADLs. Factors affecting potential to achieve goals and functional outcome are medical prognosis.. Patient will benefit from skilled SLP services to address above impairments and improve overall function.  REHAB POTENTIAL: Good  PLAN: SLP FREQUENCY: 1-2x/week  SLP DURATION: 12 weeks  PLANNED INTERVENTIONS: Cueing hierachy, Cognitive reorganization, Internal/external aids, Functional tasks, SLP instruction and feedback, Compensatory strategies, and Patient/family education    Delon Bangs, M.S., CCC-SLP Speech-Language Pathologist [facility] [phone] [place])  [facility] at [facility] [address] [place], [place], [address] Phone: [phone]   Fax:  [phone]

## 2024-05-30 ENCOUNTER — Ambulatory Visit

## 2024-05-30 ENCOUNTER — Ambulatory Visit: Admitting: Occupational Therapy

## 2024-05-30 ENCOUNTER — Ambulatory Visit: Admitting: Physical Therapy

## 2024-05-30 DIAGNOSIS — R41841 Cognitive communication deficit: Secondary | ICD-10-CM | POA: Diagnosis not present

## 2024-05-30 DIAGNOSIS — M5459 Other low back pain: Secondary | ICD-10-CM

## 2024-05-30 DIAGNOSIS — M6281 Muscle weakness (generalized): Secondary | ICD-10-CM

## 2024-05-30 NOTE — Therapy (Signed)
 Occupational Therapy Neuro Treatment Note   Patient Name: Ashley Collins MRN: [id number] DOB:June 11, 1961, 63 y.o., female Today's Date: 05/30/2024  PCP: Harvey Gaetana CROME, NP REFERRING PROVIDER: Harvey Gaetana CROME, NP   OT End of Session - 05/30/24 1624     Visit Number 45    Number of Visits 72    Date for Recertification  08/06/24    OT Start Time 1620    OT Stop Time 1700    OT Time Calculation (min) 40 min    Activity Tolerance Patient tolerated treatment well    Behavior During Therapy WFL for tasks assessed/performed               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   Cardiac arrhythmia    Nonspecific ST T wave changes on EKG   Chronic venous insufficiency of lower extremity    Complication of anesthesia    nausea and vomiting   DDD (degenerative disc disease), lumbosacral    Essential hypertension    Headache    History of kidney stones    Hyperlipidemia    Kidney stones    Lymphedema    Migraines    Osteoporosis    PONV (postoperative nausea and vomiting)    Right ureteral stone    Vitamin B12 deficiency    Vitamin D deficiency    Past Surgical History:  Procedure Laterality Date   AUGMENTATION MAMMAPLASTY     CESAREAN SECTION     x 4   COLONOSCOPY WITH PROPOFOL  N/A 03/31/2022   Procedure: COLONOSCOPY WITH PROPOFOL ;  Surgeon: Onita Elspeth Sharper, DO;  Location: [facility] ENDOSCOPY;  Service: Gastroenterology;  Laterality: N/A;   CYSTOSCOPY W/ RETROGRADES Bilateral 07/10/2020   Procedure: CYSTOSCOPY WITH RETROGRADE PYELOGRAM;  Surgeon: Francisca Redell BROCKS, MD;  Location: [facility] ORS;  Service: Urology;  Laterality: Bilateral;   CYSTOSCOPY/URETEROSCOPY/HOLMIUM LASER/STENT PLACEMENT     CYSTOSCOPY/URETEROSCOPY/HOLMIUM LASER/STENT PLACEMENT Bilateral 07/10/2020   Procedure: CYSTOSCOPY/URETEROSCOPY/HOLMIUM LASER/STENT PLACEMENT;  Surgeon: Francisca Redell BROCKS, MD;  Location: [facility] ORS;  Service:  Urology;  Laterality: Bilateral;   CYSTOSCOPY/URETEROSCOPY/HOLMIUM LASER/STENT PLACEMENT Right 08/25/2023   Procedure: CYSTOSCOPY/URETEROSCOPY/HOLMIUM LASER;  Surgeon: Francisca Redell BROCKS, MD;  Location: [facility] ORS;  Service: Urology;  Laterality: Right;   CYSTOSCOPY/URETEROSCOPY/HOLMIUM LASER/STENT PLACEMENT Right 12/15/2023   Procedure: CYSTOSCOPY/URETEROSCOPY/HOLMIUM LASER;  Surgeon: Francisca Redell BROCKS, MD;  Location: [facility] ORS;  Service: Urology;  Laterality: Right;   EXTRACORPOREAL SHOCK WAVE LITHOTRIPSY     x 10 plus   Eye Lift     FACIAL COSMETIC SURGERY     GANGLION CYST EXCISION Right 12/21/2021   Procedure: REMOVAL GANGLION OF WRIST;  Surgeon: Kathlynn Sharper, MD;  Location: [facility] ORS;  Service: Orthopedics;  Laterality: Right;   LIPOSUCTION     TONSILLECTOMY     TRIGGER FINGER RELEASE Right 12/21/2021   Procedure: RELEASE TRIGGER FINGER/A-1 PULLEY;  Surgeon: Kathlynn Sharper, MD;  Location: [facility] ORS;  Service: Orthopedics;  Laterality: Right;   URETEROSCOPY WITH HOLMIUM LASER LITHOTRIPSY     Patient Active Problem List   Diagnosis Date Noted   Acute CVA (cerebrovascular accident) (HCC) 12/19/2023   Hypokalemia 12/19/2023   AKI (acute kidney injury) 12/19/2023   Leukocytosis 12/19/2023   Hyperlipidemia, unspecified 12/19/2023   Essential hypertension 12/19/2023   Vision changes 12/19/2023   Lymphedema 05/22/2023   Chronic venous insufficiency 05/22/2023   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 replacement therapy 04/10/2023  Insomnia due to medical condition 04/25/2022   GAD (generalized anxiety disorder) 02/02/2022   Other specified depressive episodes 02/02/2022   Long-term current use of benzodiazepine 02/02/2022   Migraine with aura and without status migrainosus, not intractable 11/03/2020   Arrhythmia 08/06/2020   Status migrainosus 02/25/2019   Osteoporosis 04/09/2013   Vitamin D deficiency 04/09/2013   DDD (degenerative disc disease), lumbosacral 02/08/2005   ONSET DATE:  12/18/2023  REFERRING DIAG:   THERAPY DIAG:  Muscle weakness (generalized)  Rationale for Evaluation and Treatment: Rehabilitation  SUBJECTIVE:  SUBJECTIVE STATEMENT:   Pt. Reports  that her husband has PT at the same time as her OT appointment this afternoon.  Pt accompanied by: significant other  PERTINENT HISTORY: Pt. has Hx of stroke with onset 12/18/2023. Pt. PMHx includes: HTN, Hyperlipidemia, Kidney Stones, near syncopal event, loss of vision.  PRECAUTIONS: None  WEIGHT BEARING RESTRICTIONS: None  PAIN:  Are you having pain? No  FALLS: Has patient fallen in last 6 months? Yes. Number of falls    LIVING ENVIRONMENT: Lives with: lives with their family Lives in: [place], [place] Stairs: No, inside the house, yes but does not use Has following equipment at home:   PLOF: Independent  PATIENT GOALS: To be able to see  OBJECTIVE:   HAND DOMINANCE: Right  ADLs:  Eating: Drinking from straw is different, able to use utensils.  Grooming: Fatigues completing hair care.  UB Dressing: independent, if clothes are in front of her she can find clothing LB Dressing: Independent Toileting: Independent Bathing: Independent Tub Shower transfers: Independent Equipment: none Has difficulty with using cell phone  IADLs: Shopping: Does not typically go out shopping, and has not tried to. Light housekeeping: Pt. reports that she does not typically do house cleaning. Pt. has been able to unpack belongings from boxes due to her recent move in. Meal Prep: Pt. reports that is does not currently cook, however reports that she probably could. Has difficulty opening bottle caps/lids. Community mobility: Requires assistance from husband to navigate  through buildings, negotiate stairs-Pt. with increased fear of falling  Medication management: Able to push down medication bottles to open  Financial management: No change in the process-uses automatic bill pay system.  Has difficulty with  using cell phone Handwriting: N/T Work: Pt. was actively working managing a health visitor, works a lot of hours  MOBILITY STATUS: Independent and Needs Assist: Requires hand on hand assistance with nvigating through environments.   POSTURE COMMENTS:  No Significant postural limitations Sitting balance: Good  ACTIVITY TOLERANCE: Activity tolerance: Good  FUNCTIONAL OUTCOME MEASURES:   UPPER EXTREMITY ROM:    Active ROM Right Eval WFL Left Eval [facility]  Shoulder flexion    Shoulder abduction    Shoulder adduction    Shoulder extension    Shoulder internal rotation    Shoulder external rotation    Elbow flexion    Elbow extension    Wrist flexion    Wrist extension    Wrist ulnar deviation    Wrist radial deviation    Wrist pronation    Wrist supination    (Blank rows = not tested)  UPPER EXTREMITY MMT:     MMT Right eval Right 01/29/24 Right  04/04/24 Left eval Left 01/29/24 Left 04/04/24  Shoulder flexion 4-/5 5/5 5/5 4-/5 5/5 5/5  Shoulder abduction 4-/5 5/5 5/5 4-/5 5/5 5/5  Shoulder adduction        Shoulder extension        Shoulder internal  rotation        Shoulder external rotation        Middle trapezius        Lower trapezius        Elbow flexion 5/5 5/5 5/5 5/5 5/5 5/5  Elbow extension 5/5 5/5 5/5 5/5 5/5 5/5  Wrist flexion 4/5 5/5 5/5 4-/5 5/5 5/5  Wrist extension 4/5 5/5 5/5 4-/5 5/5 5/5  Wrist ulnar deviation        Wrist radial deviation        Wrist pronation        Wrist supination        (Blank rows = not tested)  HAND FUNCTION: Grip strength: Right: 39 lbs; Left: 28 lbs, Lateral pinch: Right: 11 lbs, Left: 9 lbs, and 3 point pinch: Right: 9 lbs, Left: 9 lbs  01/29/24 Grip strength: Right: 39 lbs; Left: 36 lbs, Lateral pinch: Right: 14 lbs, Left: 12 lbs, and 3 point pinch: Right: 12 lbs, Left: 11 lbs  04/04/24:  Grip strength: Right: 37 lbs; Left: 42 lbs  05/14/24  Grip strength: Right: 55 lbs; Left: 47  lbs  COORDINATION:  9 Hole Peg test: Right: 30 sec; Left: 36 sec  01/29/24 9 Hole Peg test: Right: 24 sec; Left: 23 sec.  04/04/24:  9 Hole Peg test: Right: 28 sec; Left: 33 sec.  05/14/24:  9 Hole Peg test: Right: 23 sec; Left: 22 sec.  SENSATION: WFL  EDEMA:   MUSCLE TONE:   COGNITION: Overall cognitive status: Within functional limits for tasks assessed  VISION: Subjective report: Pt. reports changes in her vision at onset of stroke, starting with blurry vision, resulting in vision loss in the R eye.  Baseline vision: Pt. Is able to visually track  Visual history: Hx of eye surgery  VISION ASSESSMENT: TBD  PERCEPTION: WFL  PRAXIS: WFL  OBSERVATIONS:                                                                                                                    TREATMENT DATE: 05/30/24   Therapeutic activities:   -Facilitated visual perceptual skills creating mirror images in 4 quadrants using simple, and moderately complex flip-flop targets with increased time required to complete. -The Flip-Flop Targets task was modified, and the size was graded for easier completion. -Pt. Education was provided about compensatory strategies for visual perceptual functioning.  PATIENT EDUCATION:  Compensatory strategies for visual perceptual functioning  HOME EXERCISE PROGRAM:  -Upgraded to blue theraputty for gross grip strengthening. -Green Theraputty exercises for  hand strengthening at bridge -Visual perceptual: Flip flop target design patterns. -Visual memory tasks -Writing tasks -visual motor tasks.  GOALS: Goals reviewed with patient? Yes  SHORT TERM GOALS: Target date: 06/25/2024   Pt. Will independently utilize HEP for hand strength, coordination, and visual compensatory strategies ADL/ADLs Baseline: 05/14/24: Independent 04/12/23: Independent, continue 03/06/24: Independent Eval: No  current HEP  Goal status: Ongoing   LONG TERM GOALS: Target date:  08/06/2024  Pt. Will be  able to independently implement visual scanning/visual search compensatory strategies for tasks within her extra personal space navigating through community environments 100% of the time.  Baseline: 01/29/24:  Independent  100% of the time within the therapy gym, and hallway. Eval: Pt. Requires increased assist to navigate through community environments. Goal status: Achieved  2.  Pt. Will be able to independently utilize visual scanning/visual search strategies  during ADLs/IADLs within her near space, and during tabletop tasks, and tasks at a vertical plane with 100% accuracy. Baseline: 05/14/24: 90% simple to moderately complex visual scanning tasks however 75% accuracy for complex visual scanning tasks, and scanning at a vertical plane. 04/11/24: 80% simple to moderately complex visual scanning tasks however 70% accuracy for complex visual scanning tasks, and scanning at a vertical plane.  03/06/24: Initiates visual scanning 85% of the time  01/29/24: 75% Eval: Pt. Education to be provided.  Goal status: Progressing Ongoing  3.  Pt. Will be able to independently initiate visual scanning techniques in her own environment to reduce risk of falls.  Baseline: 01/29/24: Education was provided, Pt. is utlizing visual compensatory strategies/visual scanning within her home. Eval: Pt. Education to be provided.  Goal status: Achieved  4.  Pt. Will increase BUE Grip Strength by 5# to be able to independently and securely hold objects for ADL/IADL use. Baseline: 05/14/24: Grip strength: Right: 55 lbs; Left: 47 lbs Pt. Has improved with holding items within her hand without dropping them.04/11/24: Continue- recent revision 04/04/2024: Grip strength: Right: 37 lbs; Left: 42 lbs patient is dropping items from her hands 01/29/24: Grip strength: Right: 39 lbs; Left: 28 lbs, Eval: Right Grip: 39#, Left Grip: 28# Goal status: Achieved  5.  Pt. Will increase L Lateral Pinch strength by 2# to be  able to open bottles. Baseline: 05/14/24: Pt. is able to open previously opened jars/bottles. Has difficulty with unopened jars/bottles.04/04/2024: Grip strength: Right: 37 lbs; Left: 42 lbs 01/29/24: Lateral pinch: Right: 14 lbs, Left: 12 lbs Eval: Lateral pinch: Right: 11 lbs, Left: 9 lbs  Goal status: Deferred  6.  Pt. Will increase BUE strength for shoulder flexion/abduction by 2 mm grades to independently complete hair care tasks. Baseline:  03/16/2024: 5/5 01/29/24: 5/5 overall Eval: Right Shoulder Flexion: 4-/5, Left Shoulder Flexion:4-/5, Right Shoulder Abduction: 4-/5, Left Shoulder Abduction: 4-/5 Goal status: Achieved  7.  Pt will be able to independently and efficiently manipulate medication without dropping them.  Baseline: 05/14/24: 9 Hole Peg test: Right: 23 sec; Left: 22 sec. 04/11/24: Continue, recent revision 04/04/2024: 9 Hole Peg test: Right: 28 sec; Left: 33 sec. 01/29/24: Independent 01/15/24: Daughter currently manages medication set up.  Goal status: Ongoing  8. Pt will increase typing speed to 20-30 words per minute with at least 90% accuracy to work towards more efficient typing for job related  responsibilities.  Baseline: 05/14/24: Pt. Continues to present with multiple mistypes, and requires increased time.05/14/24: 04/11/24: TBD 03/06/24: 10 wpm with 92% accuracy. 01/29/24: Continue 01/15/24: 8wpm x88% accuracy=7 wpm  Goal status: Ongoing  9. Pt will be able to scan small print on store receipts to check for errors with 100% accuracy using visual compensation strategies as needed.  Baseline: 05/14/24: Pt. Is improving with efficiency of visually scanning receipts.04/11/24: Pt. Continues to present with difficulty with visual scanning small  small items efficiently. 03/06/24: Pt. Continues to present with difficulty with visual scanning small  small items efficiently. 01/29/24: Pt. Continues to have difficulty scanning small print items. 01/15/24: Not yet attempted; required for  return to work/job responsibility  Goal status:  Ongoing  10. Pt. will independently identify 8 items consistently from visual visual memory in preparation or ADLs.    Baseline: 05/14/24: 5-7 items.04/11/24: 5-7 items  while seated at the tabletop. 03/06/24: Pt. is able to identify up to 6 picture items while seated at the tabletop. Pt. is able to consistently Identify 4 letters from visual memory when attention is divided.  01/29/24: Pt. is able to identify 6 items consistently from visual memory.    Gaol status: Ongoing      11. Pt. will be independently write 4 sentences  efficiently with 100% legibility, no deviation from writing on a blank line, and appropriate spacing between letters.              Baseline: 05/14/24: 4 lines with 80% legibility in printed form, completed in 2 min. & 16 sec. 04/11/24: Continue 03/06/24: 4 lines with 75% legibility, positive deviation below the line, and excessive spacing between the words.              Goal status: Ongoing  ASSESSMENT:  CLINICAL IMPRESSION:  Pt. required verbal cues, and visual cues perceptually for the visual perceptual Flip-Flop Targets. Pt. required cuing for the image in relation to the base horizontal, and vertical lines for approximately 75% of the simple, and moderately complex images. Pt. presented with difficulty completing moderately complex flip flop targets. Pt. was more efficiently able to complete the task when the task was modified to a larger size. Pt. frequently turned the page 180 degrees in order to complete the bottom 2 images. Pt. continues to benefit from Occupational Therapy services to improve her ability to use visual compensatory strategies, and improve overall BUE functioning in order to improve engagement in, and maximize overall independence with ADL, and IADL tasks.     PERFORMANCE DEFICITS: in functional skills including ADLs, IADLs, coordination, dexterity, Fine motor control, and vision, and psychosocial skills  including coping strategies, environmental adaptation, habits, interpersonal interactions, and routines and behaviors.   IMPAIRMENTS: are limiting patient from ADLs, IADLs, rest and sleep, work, leisure, and social participation.   CO-MORBIDITIES: may have co-morbidities  that affects occupational performance. Patient will benefit from skilled OT to address above impairments and improve overall function.  MODIFICATION OR ASSISTANCE TO COMPLETE EVALUATION: Min-Moderate modification of tasks or assist with assess necessary to complete an evaluation.  OT OCCUPATIONAL PROFILE AND HISTORY: Detailed assessment: Review of records and additional review of physical, cognitive, psychosocial history related to current functional performance.  CLINICAL DECISION MAKING: Moderate - several treatment options, min-mod task modification necessary  REHAB POTENTIAL: Good  EVALUATION COMPLEXITY: Moderate    PLAN:  OT FREQUENCY: 2x/week  OT DURATION: 12 weeks  PLANNED INTERVENTIONS: 97168 OT Re-evaluation, 97535 self care/ADL training, 02889 therapeutic exercise, 97530 therapeutic activity, 97112 neuromuscular re-education, visual/perceptual remediation/compensation, patient/family education, and DME and/or AE instructions  RECOMMENDED OTHER SERVICES: ST  CONSULTED AND AGREED WITH PLAN OF CARE: Patient and family member/caregiver  PLAN FOR NEXT SESSION: Treatment  Richardson Otter, MS, OTR/L  05/30/24, 4:29 PM

## 2024-05-30 NOTE — Therapy (Signed)
 OUTPATIENT PHYSICAL THERAPY THORACOLUMBAR TREATMENT   Patient Name: Ashley Collins MRN: [id number] DOB:1961/05/03, 63 y.o., female Today's Date: 05/30/2024  END OF SESSION:  PT End of Session - 05/30/24 1453     Visit Number 5    Number of Visits 16    Date for Recertification  06/20/24    Progress Note Due on Visit 10    PT Start Time 1448    PT Stop Time 1527    PT Time Calculation (min) 39 min    Equipment Utilized During Treatment Gait belt    Activity Tolerance Patient tolerated treatment well    Behavior During Therapy WFL for tasks assessed/performed            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   Cardiac arrhythmia    Nonspecific ST T wave changes on EKG   Chronic venous insufficiency of lower extremity    Complication of anesthesia    nausea and vomiting   DDD (degenerative disc disease), lumbosacral    Essential hypertension    Headache    History of kidney stones    Hyperlipidemia    Kidney stones    Lymphedema    Migraines    Osteoporosis    PONV (postoperative nausea and vomiting)    Right ureteral stone    Vitamin B12 deficiency    Vitamin D deficiency    Past Surgical History:  Procedure Laterality Date   AUGMENTATION MAMMAPLASTY     CESAREAN SECTION     x 4   COLONOSCOPY WITH PROPOFOL  N/A 03/31/2022   Procedure: COLONOSCOPY WITH PROPOFOL ;  Surgeon: Onita Elspeth Sharper, DO;  Location: [facility] ENDOSCOPY;  Service: Gastroenterology;  Laterality: N/A;   CYSTOSCOPY W/ RETROGRADES Bilateral 07/10/2020   Procedure: CYSTOSCOPY WITH RETROGRADE PYELOGRAM;  Surgeon: Francisca Redell BROCKS, MD;  Location: [facility] ORS;  Service: Urology;  Laterality: Bilateral;   CYSTOSCOPY/URETEROSCOPY/HOLMIUM LASER/STENT PLACEMENT     CYSTOSCOPY/URETEROSCOPY/HOLMIUM LASER/STENT PLACEMENT Bilateral 07/10/2020   Procedure: CYSTOSCOPY/URETEROSCOPY/HOLMIUM LASER/STENT PLACEMENT;  Surgeon: Francisca Redell BROCKS, MD;   Location: [facility] ORS;  Service: Urology;  Laterality: Bilateral;   CYSTOSCOPY/URETEROSCOPY/HOLMIUM LASER/STENT PLACEMENT Right 08/25/2023   Procedure: CYSTOSCOPY/URETEROSCOPY/HOLMIUM LASER;  Surgeon: Francisca Redell BROCKS, MD;  Location: [facility] ORS;  Service: Urology;  Laterality: Right;   CYSTOSCOPY/URETEROSCOPY/HOLMIUM LASER/STENT PLACEMENT Right 12/15/2023   Procedure: CYSTOSCOPY/URETEROSCOPY/HOLMIUM LASER;  Surgeon: Francisca Redell BROCKS, MD;  Location: [facility] ORS;  Service: Urology;  Laterality: Right;   EXTRACORPOREAL SHOCK WAVE LITHOTRIPSY     x 10 plus   Eye Lift     FACIAL COSMETIC SURGERY     GANGLION CYST EXCISION Right 12/21/2021   Procedure: REMOVAL GANGLION OF WRIST;  Surgeon: Kathlynn Sharper, MD;  Location: [facility] ORS;  Service: Orthopedics;  Laterality: Right;   LIPOSUCTION     TONSILLECTOMY     TRIGGER FINGER RELEASE Right 12/21/2021   Procedure: RELEASE TRIGGER FINGER/A-1 PULLEY;  Surgeon: Kathlynn Sharper, MD;  Location: [facility] ORS;  Service: Orthopedics;  Laterality: Right;   URETEROSCOPY WITH HOLMIUM LASER LITHOTRIPSY     Patient Active Problem List   Diagnosis Date Noted   Acute CVA (cerebrovascular accident) (HCC) 12/19/2023   Hypokalemia 12/19/2023   AKI (acute kidney injury) 12/19/2023   Leukocytosis 12/19/2023   Hyperlipidemia, unspecified 12/19/2023   Essential hypertension 12/19/2023   Vision changes 12/19/2023   Lymphedema 05/22/2023   Chronic venous insufficiency 05/22/2023   Menopausal syndrome on  hormone replacement therapy 04/10/2023   Insomnia due to medical condition 04/25/2022   GAD (generalized anxiety disorder) 02/02/2022   Other specified depressive episodes 02/02/2022   Long-term current use of benzodiazepine 02/02/2022   Migraine with aura and without status migrainosus, not intractable 11/03/2020   Arrhythmia 08/06/2020   Status migrainosus 02/25/2019   Osteoporosis 04/09/2013   Vitamin D deficiency 04/09/2013   DDD (degenerative disc disease), lumbosacral  02/08/2005    PCP: Harvey Gaetana CROME, NP   REFERRING PROVIDER: Meeler, Benton CROME, FNP   REFERRING DIAG: M54.16 (ICD-10-CM) - Lumbar radiculitis   Rationale for Evaluation and Treatment: Rehabilitation  THERAPY DIAG:  Muscle weakness (generalized)  Other low back pain  ONSET DATE: 1.5 months   SUBJECTIVE:                                                                                                                                                                                           SUBJECTIVE STATEMENT: Pt reports that she is doing okay this afternoon. No changes of note since last session. Still tight but feels it is helping.   From Eval: Pt here to work on pain and discomfort in her mid back. Pt reports the more she is on her feet the more her back hurts and relief comes from being supine. Pt has not had any previous therapy for her back pain. Pt has epidural that assists with her pain below her belt line. Pt still in OT working on her upper extremity and grip strength. Pt has epidural shots for last 3 years. Pt initially hurt her back lifting something to put in her car. Patient can stand for maybe 30 min at a time before pain is no longer tolerable. She reports that sitting down does not improve her pain but lying on her side in would bring relief. Pt has history of osteoporosis and sliped disc in her back. Pt pain in this area is similar to pain she gets epidural for but just further superior into her thoracic spine.    PERTINENT HISTORY:  Patient familiar to this clinic before.  Was previously seen following CVA for mobility and balance deficits.  Still seeing speech and language pathologist and occupational therapy.  PAIN:  Are you having pain? Yes: NPRS scale: 3-10 ( at end of day everyday) Pain location: above belt line and radiating laterally  Pain description: nagging, throbbing pain  Aggravating factors: Standing Relieving factors: Lying down  PRECAUTIONS:  None  RED FLAGS: None   WEIGHT BEARING RESTRICTIONS: No  FALLS:  Has patient fallen in last 6 months? No  LIVING ENVIRONMENT: Lives with: lives with  SO Lives in: House/apartment Stairs: No Has following equipment at home: None  OCCUPATION: [employer]   PLOF: Independent and Independent with basic ADLs  PATIENT GOALS: improve back pain   NEXT MD VISIT: Not scheduled   OBJECTIVE:   Note: Objective measures were completed at Evaluation unless otherwise noted.  DIAGNOSTIC FINDINGS:  No recent images since to 2023 were mild degenerative changes in the lumbar spine were noted  PATIENT SURVEYS:  Modified Oswestry:  MODIFIED OSWESTRY DISABILITY SCALE  Date: 04/25/24 Score  Pain intensity 5 =  Pain medication has no effect on my pain.  2. Personal care (washing, dressing, etc.) 0 =  I can take care of myself normally without causing increased pain.  3. Lifting 5 =  I cannot lift or carry anything at all.  4. Walking 3 =  Pain prevents me from walking more than  mile.  5. Sitting 3 =  Pain prevents me from sitting more than  hour.  6. Standing 2 =  Pain prevents me from standing more than 1 hour  7. Sleeping 1 = I can sleep well only by using pain medication.  8. Social Life 2 = Pain prevents me from participating in more energetic activities (eg. sports, dancing).  9. Traveling 2 =  My pain restricts my travel over 2 hours.  10. Employment/ Homemaking 3 = Pain prevents me from doing anything but light duties.  Total 26/50   Interpretation of scores: Score Category Description  0-20% Minimal Disability The patient can cope with most living activities. Usually no treatment is indicated apart from advice on lifting, sitting and exercise  21-40% Moderate Disability The patient experiences more pain and difficulty with sitting, lifting and standing. Travel and social life are more difficult and they may be disabled from work. Personal care, sexual activity and sleeping are  not grossly affected, and the patient can usually be managed by conservative means  41-60% Severe Disability Pain remains the main problem in this group, but activities of daily living are affected. These patients require a detailed investigation  61-80% Crippled Back pain impinges on all aspects of the patient's life. Positive intervention is required  81-100% Bed-bound These patients are either bed-bound or exaggerating their symptoms  Bluford FORBES Zoe DELENA Karon DELENA, et al. Surgery versus conservative management of stable thoracolumbar fracture: the PRESTO feasibility RCT. [place] ([place]): [employer]; 2021 Nov. [facility] Technology Assessment, No. 25.62.) Appendix 3, Oswestry Disability Index category descriptors. Available from: Findjewelers.cz  Minimally Clinically Important Difference (MCID) = 12.8%  COGNITION: Overall cognitive status: Within functional limits for tasks assessed     SENSATION: WFL  MUSCLE LENGTH: Hamstrings: WFL during DLLT   POSTURE: rounded shoulders  PALPATION: Tender to palpation along lumbar paraspinals, origin of gluteal muscles, and some along thoracic paraspinals.  Patient had most pain along lumbar paraspinals and gluteal musculature on the left greater than right side  LUMBAR ROM:   AROM eval  Flexion WNL  Extension Min - tight   Right lateral flexion To knee- tight  Left lateral flexion To knee - tight   Right rotation WNL- tight   Left rotation WNL tight    (Blank rows = not tested)  LOWER EXTREMITY ROM:    Further hip range of motion may be beneficial  LOWER EXTREMITY MMT:    MMT Right eval Left eval  Hip flexion 4 4  Hip extension    Hip abduction 4+ 4+  Hip adduction 5 5  Hip  internal rotation    Hip external rotation    Knee flexion 5 5  Knee extension 5 5  Ankle dorsiflexion 5 5  Ankle plantarflexion    Ankle inversion    Ankle eversion     (Blank rows = not tested) Measured in  seated position   LUMBAR SPECIAL TESTS:  DLLT: could not sustain past 60 degrees  FUNCTIONAL TESTS:     TREATMENT DATE: 05/30/24   Manual therapy   STM to Bil thoracic paraspinals , bil QL, and gluteal origin x 10 min   Grade 2-3 thoracic CPA mobilizations, 30 sec per segment T4-T12    TE- To improve strength, endurance, mobility, and function of specific targeted muscle groups or improve joint range of motion or improve muscle flexibility  Nustep B UE and LE movement training with MHP on lower back throughout x8 min L 3-6 - reports pain improvement with activity   SL open book stretch 2 x 10 ea side   All 4s full bird dog 2 x 5 reps ea LE - cues for sequencing, relatively unstable in bird dog position   LTR x 15 bil with 3 sec hold  SL plank progression - torso on green peanut p ball, lifting hips up ( in frontal plane) with 3 sec hold x 10 ea side   Hook lying 2 x 10 bridges with GTB around knees    PATIENT EDUCATION: Education details: POC Person educated: Patient Education method: Explanation Education comprehension: verbalized understanding   HOME EXERCISE PROGRAM: Access Code: [email or web address] URL: [email or web address] Date: 04/30/2024 Prepared by: Lonni Gainer  Exercises - Clamshell  - 1 x daily - 7 x weekly - 3 sets - 10 reps - Bird Dog Progression  - 1 x daily - 7 x weekly - 1 sets - 10 reps - Sidelying Thoracic Rotation with Open Book  - 1 x daily - 7 x weekly - 1 sets - 10 reps - 5 sec hold   ASSESSMENT:  CLINICAL IMPRESSION: Patient arrived with good motivation for completion of pt activities.  Pt continues to progress with manual techniques for pain reduction and subsequent challenges to her core control and Thoracic ROM. Pt progressing well and tolerating new challenges in session. Still has difficulty with sequencing of some movements requiring cues for proper completion. Pt will continue to benefit from skilled physical therapy  intervention to address impairments, improve QOL, and attain therapy goals.    OBJECTIVE IMPAIRMENTS: Abnormal gait.   ACTIVITY LIMITATIONS: carrying, standing, squatting, and locomotion level  PARTICIPATION LIMITATIONS: meal prep, cleaning, shopping, community activity, and occupation  PERSONAL FACTORS: Past/current experiences, Time since onset of injury/illness/exacerbation, and 3+ comorbidities: HTN, HLD, osteoporosis, B12 deficiency are also affecting patient's functional outcome.   REHAB POTENTIAL: Good  CLINICAL DECISION MAKING: Stable/uncomplicated  EVALUATION COMPLEXITY: Low   GOALS: Goals reviewed with patient? Yes  SHORT TERM GOALS: Target date: 05/23/2024       Patient will be independent in home exercise program to improve strength/mobility for better functional independence with ADLs. Baseline: No HEP currently  Goal status: INITIAL   LONG TERM GOALS: Target date: 06/19/2024    Patient will improve modified Oswestry pain disability questionnaire by 10 points or greater in order to indicate improvement in her subjective level of back pain as well as her ability to complete functional tasks without pain limitations Baseline: 26 Goal status: INITIAL  2.  Patient will improve double leg limb lowering test from 60 degrees to 30 degrees or greater  showing good core control throughout in order to indicate improved strength of anterior core muscles for improved lumbar stability Baseline: 60 degrees patient loses control and has some discomfort Goal status: INITIAL  3.  Patient will report ability to stand for greater than 1 hour without limitations of pain over to allow her to return to work-related tasks as well as shopping related tasks with less limitation Baseline: When patient stands for 30 minutes per her report she starts to get intense pain that is only relieved by lying down Goal status: INITIAL  4.  Patient report ability to lift light objects from the  floor and set them on an object such as a table in order without increase in back pain to show improved functional capacity as well as improved ability to return to work-related tasks Baseline: Patient reports she cannot lift or carry anything at all  Goal status: INITIAL    PLAN:  PT FREQUENCY: 2x/week  PT DURATION: 8 weeks  PLANNED INTERVENTIONS: 97750- Physical Performance Testing, 97110-Therapeutic exercises, 97530- Therapeutic activity, [id number]- Neuromuscular re-education, 97535- Self Care, 02859- Manual therapy, [id number]- Gait training, [phone] (1-2 muscles), 20561 (3+ muscles)- Dry Needling, Patient/Family education, Balance training, Stair training, and Moist heat.  PLAN FOR NEXT SESSION:   hip assessment, STM or IASTM to paraspinals, QL and/ or gluteal origin Standing endurance - Standing side plank and bird dog on wall    Lonni KATHEE Gainer, PT 05/30/2024, 2:58 PM

## 2024-05-30 NOTE — Therapy (Signed)
 OUTPATIENT SPEECH LANGUAGE PATHOLOGY  TREATMENT   Patient Name: Ashley Collins MRN: [id number] DOB:02-13-1961, 63 y.o., female Today's Date: 05/30/2024  PCP: Gaetana Haddock, NP  REFERRING PROVIDER: same     End of Session - 05/30/24 1436     Visit Number 27    Number of Visits 47    Date for Recertification  08/06/24    SLP Start Time 1530    SLP Stop Time  1615    SLP Time Calculation (min) 45 min    Activity Tolerance Patient tolerated treatment well           Patient Active Problem List   Diagnosis Date Noted   Acute CVA (cerebrovascular accident) (HCC) 12/19/2023   Hypokalemia 12/19/2023   AKI (acute kidney injury) 12/19/2023   Leukocytosis 12/19/2023   Hyperlipidemia, unspecified 12/19/2023   Essential hypertension 12/19/2023   Vision changes 12/19/2023   Lymphedema 05/22/2023   Chronic venous insufficiency 05/22/2023   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 replacement therapy 04/10/2023   Insomnia due to medical condition 04/25/2022   GAD (generalized anxiety disorder) 02/02/2022   Other specified depressive episodes 02/02/2022   Long-term current use of benzodiazepine 02/02/2022   Migraine with aura and without status migrainosus, not intractable 11/03/2020   Arrhythmia 08/06/2020   Status migrainosus 02/25/2019   Osteoporosis 04/09/2013   Vitamin D deficiency 04/09/2013   DDD (degenerative disc disease), lumbosacral 02/08/2005    ONSET DATE: 12/19/23   REFERRING DIAG: CVA- memory deficits  THERAPY DIAG:  Cognitive communication deficit  Rationale for Evaluation and Treatment Rehabilitation  SUBJECTIVE:   SUBJECTIVE STATEMENT: Pt alert, pleasant, and cooperative. Pt accompanied by: self and significant other  PERTINENT HISTORY & DIAGNOSTIC FINDINGS: Pt is 63 y.o. female who presents today for a cognitive-communication evaluation in setting of stroke. MRI 12/18/23 1. 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 No  associated hemorrhage or mass effect. PMHx as outlined above.  PAIN:  Are you having pain? No   FALLS: Has patient fallen in last 6 months?  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 Pt was able to summarize key points from chapter in biography she is reading with use of notes. Pt stated she is continuing to read aloud, summarize intermittently, write notes and review notes prior to resuming reading. Pt noted needing to summarize after each paragraph vs after whole page. Pt noted difficulty with underlining, suggested use of index card for straighter lines. Pt reports improved reading stamina and retention with reading moved to earlier in the day. Discussed compensation of moving more difficult tasks to best time of day and how to do that at work. Will continue to target.  Short term memory and divided attention targeted. Pt located x10 differences between pictures and tasked to recall 5 differences immediately and after 10 delay. Pt able to recall 5 items with extra time both immediately and after 10 minute delay.    PATIENT EDUCATION: Education details: as above Person educated: Patient and Spouse Education method: Explanation Education comprehension: verbalized understanding  HOME EXERCISE PROGRAM:        Continue compensations for attention and memory (with emphasis on reading)  GOALS:  Goals reviewed with patient? Yes  SHORT TERM GOALS: Target date: 10 sessions  Pt will complete PROM re: memory.  Baseline: Goal status: MET   2.  Pt will endorse successful implementation of at least x2 compensations for attention  and memory.  Baseline:  Goal status: MET  3.  With Moderate A, patient will establish external aid for memory/executive function and bring to more than 50% of therapy sessions.    Baseline:  Goal status: MET  New goals - established 05/14/2024 4. Pt will utilize compensation for attention/recall (e.g. reading aloud, notetaking) to recall and summarize details in a functional task (e.g. therapy session, reading, tv program).  Baseline:  Goal status: INITIAL   5. Pt will demonstrate alternating attention over 10 minutes between 2 mod complex cognitive-linguistic tasks >80% accuracy with modified Independence. Baseline: Goal status: INITIAL    6. Pt will identify x3 compensations for improved cognitive-communication during functional activities at home/work.   Baseline:   Goal status: INITIAL    LONG TERM GOALS: Target date: 12 weeks  Pt will endorse improvement in cognitive-communication per PROM.  Baseline:  Goal status: MET  2.  Pt and/or husband will demonstrate understanding of ways to promote and support cognitive-communication outside of SLP sessions.  Baseline:  Goal status: MET  New goal - established 05/14/24 3. Patient will report engagement in cognitive activities outside of ST 4/7 days.   Baseline:   Goal status: INITIAL  ASSESSMENT:  CLINICAL IMPRESSION:  Pt is 62 y.o. female who presents today for a cognitive-communication treatment in setting of stroke. Initial assessment completed via formal means (Cognitive-Linguistic Quick Test) and PROM (Neuro-QoL Adult Cognitive Function v2.0). Pt presents with at least mild cognitive-communication deficits affecting attention and visuospatial skills per CLQT as well as memory and executive functioning per PROM. Suspect CLQT is not as sensitive to higher level cognitive-communication deficits appreciated by pt during iADLs. Pt continues to make excellent progress; however, pt continues with impaired working memory/divided  attention and short term recall. Pt planning to transition back to work in in January 2026 and pt would benefit from further compensation training and cognitive retraining to promote a successful transition back to work. Please see details of today's tx as outlined above. Recommend skilled ST services targeting above mentioned deficits to improve QoL and performance on ADLs/IADLs.  OBJECTIVE IMPAIRMENTS include attention, memory, executive functioning, and visuospatial deficits. These impairments are limiting patient from return to work, managing appointments, household responsibilities, and ADLs/IADLs. Factors affecting potential to achieve goals and functional outcome are medical prognosis.. Patient will benefit from skilled SLP services to address above impairments and improve overall function.  REHAB POTENTIAL: Good  PLAN: SLP FREQUENCY: 1-2x/week  SLP DURATION: 12 weeks  PLANNED INTERVENTIONS: Cueing hierachy, Cognitive reorganization, Internal/external aids, Functional tasks, SLP instruction and feedback, Compensatory strategies, and Patient/family education    Delon Bangs, M.S., CCC-SLP Speech-Language Pathologist [place] [facility] [phone] [place])  [facility] Outpatient Rehabilitation at [facility] [address] [place], [place], [address] Phone: [phone]   Fax:  [phone]

## 2024-06-03 ENCOUNTER — Ambulatory Visit: Admitting: Occupational Therapy

## 2024-06-03 ENCOUNTER — Ambulatory Visit

## 2024-06-03 DIAGNOSIS — M6281 Muscle weakness (generalized): Secondary | ICD-10-CM

## 2024-06-03 DIAGNOSIS — R278 Other lack of coordination: Secondary | ICD-10-CM

## 2024-06-03 DIAGNOSIS — M546 Pain in thoracic spine: Secondary | ICD-10-CM

## 2024-06-03 DIAGNOSIS — R41841 Cognitive communication deficit: Secondary | ICD-10-CM

## 2024-06-03 DIAGNOSIS — M5459 Other low back pain: Secondary | ICD-10-CM

## 2024-06-03 DIAGNOSIS — H543 Unqualified visual loss, both eyes: Secondary | ICD-10-CM

## 2024-06-03 NOTE — Therapy (Signed)
 Occupational Therapy Neuro Treatment Note   Patient Name: Ashley Collins MRN: [id number] DOB:06-Jul-1960, 63 y.o., female Today's Date: 06/03/2024  PCP: Harvey Gaetana CROME, NP REFERRING PROVIDER: Harvey Gaetana CROME, NP   OT End of Session - 06/03/24 1426     Visit Number 46    Number of Visits 72    Date for Recertification  08/06/24    OT Start Time 1400    OT Stop Time 1445    OT Time Calculation (min) 45 min    Activity Tolerance Patient tolerated treatment well    Behavior During Therapy WFL for tasks assessed/performed               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   Cardiac arrhythmia    Nonspecific ST T wave changes on EKG   Chronic venous insufficiency of lower extremity    Complication of anesthesia    nausea and vomiting   DDD (degenerative disc disease), lumbosacral    Essential hypertension    Headache    History of kidney stones    Hyperlipidemia    Kidney stones    Lymphedema    Migraines    Osteoporosis    PONV (postoperative nausea and vomiting)    Right ureteral stone    Vitamin B12 deficiency    Vitamin D deficiency    Past Surgical History:  Procedure Laterality Date   AUGMENTATION MAMMAPLASTY     CESAREAN SECTION     x 4   COLONOSCOPY WITH PROPOFOL  N/A 03/31/2022   Procedure: COLONOSCOPY WITH PROPOFOL ;  Surgeon: Onita Elspeth Sharper, DO;  Location: [facility] ENDOSCOPY;  Service: Gastroenterology;  Laterality: N/A;   CYSTOSCOPY W/ RETROGRADES Bilateral 07/10/2020   Procedure: CYSTOSCOPY WITH RETROGRADE PYELOGRAM;  Surgeon: Francisca Redell BROCKS, MD;  Location: [facility] ORS;  Service: Urology;  Laterality: Bilateral;   CYSTOSCOPY/URETEROSCOPY/HOLMIUM LASER/STENT PLACEMENT     CYSTOSCOPY/URETEROSCOPY/HOLMIUM LASER/STENT PLACEMENT Bilateral 07/10/2020   Procedure: CYSTOSCOPY/URETEROSCOPY/HOLMIUM LASER/STENT PLACEMENT;  Surgeon: Francisca Redell BROCKS, MD;  Location: [facility] ORS;  Service:  Urology;  Laterality: Bilateral;   CYSTOSCOPY/URETEROSCOPY/HOLMIUM LASER/STENT PLACEMENT Right 08/25/2023   Procedure: CYSTOSCOPY/URETEROSCOPY/HOLMIUM LASER;  Surgeon: Francisca Redell BROCKS, MD;  Location: [facility] ORS;  Service: Urology;  Laterality: Right;   CYSTOSCOPY/URETEROSCOPY/HOLMIUM LASER/STENT PLACEMENT Right 12/15/2023   Procedure: CYSTOSCOPY/URETEROSCOPY/HOLMIUM LASER;  Surgeon: Francisca Redell BROCKS, MD;  Location: [facility] ORS;  Service: Urology;  Laterality: Right;   EXTRACORPOREAL SHOCK WAVE LITHOTRIPSY     x 10 plus   Eye Lift     FACIAL COSMETIC SURGERY     GANGLION CYST EXCISION Right 12/21/2021   Procedure: REMOVAL GANGLION OF WRIST;  Surgeon: Kathlynn Sharper, MD;  Location: [facility] ORS;  Service: Orthopedics;  Laterality: Right;   LIPOSUCTION     TONSILLECTOMY     TRIGGER FINGER RELEASE Right 12/21/2021   Procedure: RELEASE TRIGGER FINGER/A-1 PULLEY;  Surgeon: Kathlynn Sharper, MD;  Location: [facility] ORS;  Service: Orthopedics;  Laterality: Right;   URETEROSCOPY WITH HOLMIUM LASER LITHOTRIPSY     Patient Active Problem List   Diagnosis Date Noted   Acute CVA (cerebrovascular accident) (HCC) 12/19/2023   Hypokalemia 12/19/2023   AKI (acute kidney injury) 12/19/2023   Leukocytosis 12/19/2023   Hyperlipidemia, unspecified 12/19/2023   Essential hypertension 12/19/2023   Vision changes 12/19/2023   Lymphedema 05/22/2023   Chronic venous insufficiency 05/22/2023   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 replacement therapy 04/10/2023  Insomnia due to medical condition 04/25/2022   GAD (generalized anxiety disorder) 02/02/2022   Other specified depressive episodes 02/02/2022   Long-term current use of benzodiazepine 02/02/2022   Migraine with aura and without status migrainosus, not intractable 11/03/2020   Arrhythmia 08/06/2020   Status migrainosus 02/25/2019   Osteoporosis 04/09/2013   Vitamin D deficiency 04/09/2013   DDD (degenerative disc disease), lumbosacral 02/08/2005   ONSET DATE:  12/18/2023  REFERRING DIAG:   THERAPY DIAG:  Muscle weakness (generalized)  Other lack of coordination  Low vision, both eyes  Rationale for Evaluation and Treatment: Rehabilitation  SUBJECTIVE:  SUBJECTIVE STATEMENT:   Pt. reports that she will start back to work the week of Jan. 11th for 4 hour shifts initially, and gradually increase her hours.  Pt accompanied by: significant other  PERTINENT HISTORY: Pt. has Hx of stroke with onset 12/18/2023. Pt. PMHx includes: HTN, Hyperlipidemia, Kidney Stones, near syncopal event, loss of vision.  PRECAUTIONS: None  WEIGHT BEARING RESTRICTIONS: None  PAIN:  Are you having pain? No  FALLS: Has patient fallen in last 6 months? Yes. Number of falls    LIVING ENVIRONMENT: Lives with: lives with their family Lives in: [place], [place] Stairs: No, inside the house, yes but does not use Has following equipment at home:   PLOF: Independent  PATIENT GOALS: To be able to see  OBJECTIVE:   HAND DOMINANCE: Right  ADLs:  Eating: Drinking from straw is different, able to use utensils.  Grooming: Fatigues completing hair care.  UB Dressing: independent, if clothes are in front of her she can find clothing LB Dressing: Independent Toileting: Independent Bathing: Independent Tub Shower transfers: Independent Equipment: none Has difficulty with using cell phone  IADLs: Shopping: Does not typically go out shopping, and has not tried to. Light housekeeping: Pt. reports that she does not typically do house cleaning. Pt. has been able to unpack belongings from boxes due to her recent move in. Meal Prep: Pt. reports that is does not currently cook, however reports that she probably could. Has difficulty opening bottle caps/lids. Community mobility: Requires assistance from husband to navigate  through buildings, negotiate stairs-Pt. with increased fear of falling  Medication management: Able to push down medication bottles to open  Financial  management: No change in the process-uses automatic bill pay system.  Has difficulty with using cell phone Handwriting: N/T Work: Pt. was actively working managing a health visitor, works a lot of hours  MOBILITY STATUS: Independent and Needs Assist: Requires hand on hand assistance with nvigating through environments.   POSTURE COMMENTS:  No Significant postural limitations Sitting balance: Good  ACTIVITY TOLERANCE: Activity tolerance: Good  FUNCTIONAL OUTCOME MEASURES:   UPPER EXTREMITY ROM:    Active ROM Right Eval WFL Left Eval [facility]  Shoulder flexion    Shoulder abduction    Shoulder adduction    Shoulder extension    Shoulder internal rotation    Shoulder external rotation    Elbow flexion    Elbow extension    Wrist flexion    Wrist extension    Wrist ulnar deviation    Wrist radial deviation    Wrist pronation    Wrist supination    (Blank rows = not tested)  UPPER EXTREMITY MMT:     MMT Right eval Right 01/29/24 Right  04/04/24 Left eval Left 01/29/24 Left 04/04/24  Shoulder flexion 4-/5 5/5 5/5 4-/5 5/5 5/5  Shoulder abduction 4-/5 5/5 5/5 4-/5 5/5 5/5  Shoulder adduction  Shoulder extension        Shoulder internal rotation        Shoulder external rotation        Middle trapezius        Lower trapezius        Elbow flexion 5/5 5/5 5/5 5/5 5/5 5/5  Elbow extension 5/5 5/5 5/5 5/5 5/5 5/5  Wrist flexion 4/5 5/5 5/5 4-/5 5/5 5/5  Wrist extension 4/5 5/5 5/5 4-/5 5/5 5/5  Wrist ulnar deviation        Wrist radial deviation        Wrist pronation        Wrist supination        (Blank rows = not tested)  HAND FUNCTION: Grip strength: Right: 39 lbs; Left: 28 lbs, Lateral pinch: Right: 11 lbs, Left: 9 lbs, and 3 point pinch: Right: 9 lbs, Left: 9 lbs  01/29/24 Grip strength: Right: 39 lbs; Left: 36 lbs, Lateral pinch: Right: 14 lbs, Left: 12 lbs, and 3 point pinch: Right: 12 lbs, Left: 11 lbs  04/04/24:  Grip strength: Right: 37 lbs;  Left: 42 lbs  05/14/24  Grip strength: Right: 55 lbs; Left: 47 lbs  COORDINATION:  9 Hole Peg test: Right: 30 sec; Left: 36 sec  01/29/24 9 Hole Peg test: Right: 24 sec; Left: 23 sec.  04/04/24:  9 Hole Peg test: Right: 28 sec; Left: 33 sec.  05/14/24:  9 Hole Peg test: Right: 23 sec; Left: 22 sec.  SENSATION: WFL  EDEMA:   MUSCLE TONE:   COGNITION: Overall cognitive status: Within functional limits for tasks assessed  VISION: Subjective report: Pt. reports changes in her vision at onset of stroke, starting with blurry vision, resulting in vision loss in the R eye.  Baseline vision: Pt. Is able to visually track  Visual history: Hx of eye surgery  VISION ASSESSMENT: TBD  PERCEPTION: WFL  PRAXIS: WFL  OBSERVATIONS:                                                                                                                    TREATMENT DATE: 06/03/24   Therapeutic activities:   -Reviewed the completed visual perceptual homework task with visual cues.  -Facilitated writing skills copying sentences following a preset list of directions from memory. -A dual-tasking component was added to the task to further challenge the task, and increase the level to complexity.  PATIENT EDUCATION:  Compensatory strategies for visual perceptual functioning  HOME EXERCISE PROGRAM:  -Upgraded to blue theraputty for gross grip strengthening. -Green Theraputty exercises for  hand strengthening at bridge -Visual perceptual: Flip flop target design patterns. -Visual memory tasks -Writing tasks -visual motor tasks.  GOALS: Goals reviewed with patient? Yes  SHORT TERM GOALS: Target date: 06/25/2024   Pt. Will independently utilize HEP for hand strength, coordination, and visual compensatory strategies ADL/ADLs Baseline: 05/14/24: Independent 04/12/23: Independent, continue 03/06/24: Independent Eval: No  current HEP  Goal status: Ongoing   LONG TERM GOALS: Target date:  08/06/2024  Pt. Will be able to independently implement visual scanning/visual search compensatory strategies for tasks within her extra personal space navigating through community environments 100% of the time.  Baseline: 01/29/24:  Independent  100% of the time within the therapy gym, and hallway. Eval: Pt. Requires increased assist to navigate through community environments. Goal status: Achieved  2.  Pt. Will be able to independently utilize visual scanning/visual search strategies  during ADLs/IADLs within her near space, and during tabletop tasks, and tasks at a vertical plane with 100% accuracy. Baseline: 05/14/24: 90% simple to moderately complex visual scanning tasks however 75% accuracy for complex visual scanning tasks, and scanning at a vertical plane. 04/11/24: 80% simple to moderately complex visual scanning tasks however 70% accuracy for complex visual scanning tasks, and scanning at a vertical plane.  03/06/24: Initiates visual scanning 85% of the time  01/29/24: 75% Eval: Pt. Education to be provided.  Goal status: Progressing Ongoing  3.  Pt. Will be able to independently initiate visual scanning techniques in her own environment to reduce risk of falls.  Baseline: 01/29/24: Education was provided, Pt. is utlizing visual compensatory strategies/visual scanning within her home. Eval: Pt. Education to be provided.  Goal status: Achieved  4.  Pt. Will increase BUE Grip Strength by 5# to be able to independently and securely hold objects for ADL/IADL use. Baseline: 05/14/24: Grip strength: Right: 55 lbs; Left: 47 lbs Pt. Has improved with holding items within her hand without dropping them.04/11/24: Continue- recent revision 04/04/2024: Grip strength: Right: 37 lbs; Left: 42 lbs patient is dropping items from her hands 01/29/24: Grip strength: Right: 39 lbs; Left: 28 lbs, Eval: Right Grip: 39#, Left Grip: 28# Goal status: Achieved  5.  Pt. Will increase L Lateral Pinch strength by 2# to be  able to open bottles. Baseline: 05/14/24: Pt. is able to open previously opened jars/bottles. Has difficulty with unopened jars/bottles.04/04/2024: Grip strength: Right: 37 lbs; Left: 42 lbs 01/29/24: Lateral pinch: Right: 14 lbs, Left: 12 lbs Eval: Lateral pinch: Right: 11 lbs, Left: 9 lbs  Goal status: Deferred  6.  Pt. Will increase BUE strength for shoulder flexion/abduction by 2 mm grades to independently complete hair care tasks. Baseline:  03/16/2024: 5/5 01/29/24: 5/5 overall Eval: Right Shoulder Flexion: 4-/5, Left Shoulder Flexion:4-/5, Right Shoulder Abduction: 4-/5, Left Shoulder Abduction: 4-/5 Goal status: Achieved  7.  Pt will be able to independently and efficiently manipulate medication without dropping them.  Baseline: 05/14/24: 9 Hole Peg test: Right: 23 sec; Left: 22 sec. 04/11/24: Continue, recent revision 04/04/2024: 9 Hole Peg test: Right: 28 sec; Left: 33 sec. 01/29/24: Independent 01/15/24: Daughter currently manages medication set up.  Goal status: Ongoing  8. Pt will increase typing speed to 20-30 words per minute with at least 90% accuracy to work towards more efficient typing for job related  responsibilities.  Baseline: 05/14/24: Pt. Continues to present with multiple mistypes, and requires increased time.05/14/24: 04/11/24: TBD 03/06/24: 10 wpm with 92% accuracy. 01/29/24: Continue 01/15/24: 8wpm x88% accuracy=7 wpm  Goal status: Ongoing  9. Pt will be able to scan small print on store receipts to check for errors with 100% accuracy using visual compensation strategies as needed.  Baseline: 05/14/24: Pt. Is improving with efficiency of visually scanning receipts.04/11/24: Pt. Continues to present with difficulty with visual scanning small  small items efficiently. 03/06/24: Pt. Continues to present with difficulty with visual scanning small  small items efficiently. 01/29/24: Pt. Continues to have difficulty scanning small print items. 01/15/24:  Not yet attempted; required for  return to work/job responsibility  Goal status:  Ongoing  10. Pt. will independently identify 8 items consistently from visual visual memory in preparation or ADLs.    Baseline: 05/14/24: 5-7 items.04/11/24: 5-7 items  while seated at the tabletop. 03/06/24: Pt. is able to identify up to 6 picture items while seated at the tabletop. Pt. is able to consistently Identify 4 letters from visual memory when attention is divided.  01/29/24: Pt. is able to identify 6 items consistently from visual memory.    Gaol status: Ongoing      11. Pt. will be independently write 4 sentences  efficiently with 100% legibility, no deviation from writing on a blank line, and appropriate spacing between letters.              Baseline: 05/14/24: 4 lines with 80% legibility in printed form, completed in 2 min. & 16 sec. 04/11/24: Continue 03/06/24: 4 lines with 75% legibility, positive deviation below the line, and excessive spacing between the words.              Goal status: Ongoing  ASSESSMENT:  CLINICAL IMPRESSION:  Pt. has improved with simple visual perceptual Flip-Flop Targets  and was able to complete it with 90% accuracy. Pt. was able to complete complex targets with 60% accuracy. Pt. required an anchor to avoid deviation below the line when copying sentences. Pt. has improved with writing legibility. Pt. was able to follow approximately 75% of the preset directions during the  sequential writing task. Pt. continues to benefit from Occupational Therapy services to improve her ability to use visual compensatory strategies, and improve overall BUE functioning in order to improve engagement in, and maximize overall independence with ADL, and IADL tasks.     PERFORMANCE DEFICITS: in functional skills including ADLs, IADLs, coordination, dexterity, Fine motor control, and vision, and psychosocial skills including coping strategies, environmental adaptation, habits, interpersonal interactions, and routines and behaviors.    IMPAIRMENTS: are limiting patient from ADLs, IADLs, rest and sleep, work, leisure, and social participation.   CO-MORBIDITIES: may have co-morbidities  that affects occupational performance. Patient will benefit from skilled OT to address above impairments and improve overall function.  MODIFICATION OR ASSISTANCE TO COMPLETE EVALUATION: Min-Moderate modification of tasks or assist with assess necessary to complete an evaluation.  OT OCCUPATIONAL PROFILE AND HISTORY: Detailed assessment: Review of records and additional review of physical, cognitive, psychosocial history related to current functional performance.  CLINICAL DECISION MAKING: Moderate - several treatment options, min-mod task modification necessary  REHAB POTENTIAL: Good  EVALUATION COMPLEXITY: Moderate    PLAN:  OT FREQUENCY: 2x/week  OT DURATION: 12 weeks  PLANNED INTERVENTIONS: 97168 OT Re-evaluation, 97535 self care/ADL training, 02889 therapeutic exercise, 97530 therapeutic activity, 97112 neuromuscular re-education, visual/perceptual remediation/compensation, patient/family education, and DME and/or AE instructions  RECOMMENDED OTHER SERVICES: ST  CONSULTED AND AGREED WITH PLAN OF CARE: Patient and family member/caregiver  PLAN FOR NEXT SESSION: Treatment  Richardson Otter, MS, OTR/L  06/03/24, 2:32 PM

## 2024-06-03 NOTE — Therapy (Signed)
 OUTPATIENT SPEECH LANGUAGE PATHOLOGY  TREATMENT   Patient Name: Ashley Collins MRN: [id number] DOB:[birth year], 63 y.o., female Today's Date: 06/03/2024  PCP: Gaetana Haddock, NP  REFERRING PROVIDER: same     End of Session - 06/03/24 1344     Visit Number 28    Number of Visits 47    Date for Recertification  08/06/24    SLP Start Time 1445    SLP Stop Time  1530    SLP Time Calculation (min) 45 min    Activity Tolerance Patient tolerated treatment well           Patient Active Problem List   Diagnosis Date Noted   Acute CVA (cerebrovascular accident) (HCC) 12/19/2023   Hypokalemia 12/19/2023   AKI (acute kidney injury) 12/19/2023   Leukocytosis 12/19/2023   Hyperlipidemia, unspecified 12/19/2023   Essential hypertension 12/19/2023   Vision changes 12/19/2023   Lymphedema 05/22/2023   Chronic venous insufficiency 05/22/2023   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 replacement therapy 04/10/2023   Insomnia due to medical condition 04/25/2022   GAD (generalized anxiety disorder) 02/02/2022   Other specified depressive episodes 02/02/2022   Long-term current use of benzodiazepine 02/02/2022   Migraine with aura and without status migrainosus, not intractable 11/03/2020   Arrhythmia 08/06/2020   Status migrainosus 02/25/2019   Osteoporosis 04/09/2013   Vitamin D deficiency 04/09/2013   DDD (degenerative disc disease), lumbosacral 02/08/2005    ONSET DATE: 12/19/23   REFERRING DIAG: CVA- memory deficits  THERAPY DIAG:  Cognitive communication deficit  Rationale for Evaluation and Treatment Rehabilitation  SUBJECTIVE:   SUBJECTIVE STATEMENT: Pt alert, pleasant, and cooperative. Pt accompanied by: self and significant other  PERTINENT HISTORY & DIAGNOSTIC FINDINGS: Pt is 63 y.o. female who presents today for a cognitive-communication evaluation in setting of stroke. MRI 12/18/23 1. 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 No  associated hemorrhage or mass effect. PMHx as outlined above.  PAIN:  Are you having pain? No   FALLS: Has patient fallen in last 6 months?  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 Pt was able to summarize key points from chapter in biography she is reading with use of notes. Pt stated she is continuing to read aloud, summarize intermittently, write notes and review notes prior to resuming reading. Pt noted needing to summarize after each paragraph vs after whole page. Pt now using index card for scanning. Pt reports improved reading stamina and retention with reading moved to earlier in the day. Discussed compensation of moving more difficult tasks to best time of day and how to do that at work. Will continue to target.  Reviewed WRAP strategies. Pt utilized to recall x5 differences in pictured scene from last session with extra time.     PATIENT EDUCATION: Education details: as above Person educated: Patient and Spouse Education method: Explanation Education comprehension: verbalized understanding  HOME EXERCISE PROGRAM:        Continue compensations for attention and memory (with emphasis on reading)       GOALS:  Goals reviewed with patient? Yes  SHORT TERM GOALS: Target date: 10 sessions  Pt will complete PROM re: memory.  Baseline: Goal  status: MET   2.  Pt will endorse successful implementation of at least x2 compensations for attention and memory.  Baseline:  Goal status: MET  3.  With Moderate A, patient will establish external aid for memory/executive function and bring to more than 50% of therapy sessions.     Baseline:  Goal status: MET  New goals - established 05/14/2024 4. Pt will utilize compensation for attention/recall (e.g. reading aloud, notetaking) to recall and summarize details in a functional task (e.g. therapy session, reading, tv program).  Baseline:  Goal status: INITIAL   5. Pt will demonstrate alternating attention over 10 minutes between 2 mod complex cognitive-linguistic tasks >80% accuracy with modified Independence. Baseline: Goal status: INITIAL    6. Pt will identify x3 compensations for improved cognitive-communication during functional activities at home/work.   Baseline:   Goal status: INITIAL    LONG TERM GOALS: Target date: 12 weeks  Pt will endorse improvement in cognitive-communication per PROM.  Baseline:  Goal status: MET  2.  Pt and/or husband will demonstrate understanding of ways to promote and support cognitive-communication outside of SLP sessions.  Baseline:  Goal status: MET  New goal - established 05/14/24 3. Patient will report engagement in cognitive activities outside of ST 4/7 days.   Baseline:   Goal status: INITIAL  ASSESSMENT:  CLINICAL IMPRESSION:  Pt is 63 y.o. female who presents today for a cognitive-communication treatment in setting of stroke. Initial assessment completed via formal means (Cognitive-Linguistic Quick Test) and PROM (Neuro-QoL Adult Cognitive Function v2.0). Pt presents with at least mild cognitive-communication deficits affecting attention and visuospatial skills per CLQT as well as memory and executive functioning per PROM. Suspect CLQT is not as sensitive to higher level cognitive-communication deficits appreciated by pt during iADLs. Pt continues to make excellent progress; however, pt continues with impaired working memory/divided attention and short term recall. Pt planning to transition back to work in in January 2026 and pt would benefit from further compensation training and cognitive retraining to promote  a successful transition back to work. Please see details of today's tx as outlined above. Recommend skilled ST services targeting above mentioned deficits to improve QoL and performance on ADLs/IADLs.  OBJECTIVE IMPAIRMENTS include attention, memory, executive functioning, and visuospatial deficits. These impairments are limiting patient from return to work, managing appointments, household responsibilities, and ADLs/IADLs. Factors affecting potential to achieve goals and functional outcome are medical prognosis.. Patient will benefit from skilled SLP services to address above impairments and improve overall function.  REHAB POTENTIAL: Good  PLAN: SLP FREQUENCY: 1-2x/week  SLP DURATION: 12 weeks  PLANNED INTERVENTIONS: Cueing hierachy, Cognitive reorganization, Internal/external aids, Functional tasks, SLP instruction and feedback, Compensatory strategies, and Patient/family education    Delon Bangs, M.S., CCC-SLP Speech-Language Pathologist [place] [facility] [phone] [place])  [facility] at [facility] [address] [place], [place], [address] Phone: [phone]   Fax:  [phone]

## 2024-06-03 NOTE — Therapy (Signed)
 OUTPATIENT PHYSICAL THERAPY THORACOLUMBAR TREATMENT   Patient Name: Ashley Collins MRN: [id number] DOB:1961/06/10, 63 y.o., female Today's Date: 06/04/2024  END OF SESSION:  PT End of Session - 06/03/24 1501     Visit Number 6    Number of Visits 16    Date for Recertification  06/20/24    Progress Note Due on Visit 10    PT Start Time 1530    PT Stop Time 1617    PT Time Calculation (min) 47 min    Equipment Utilized During Treatment Gait belt    Activity Tolerance Patient tolerated treatment well    Behavior During Therapy WFL for tasks assessed/performed            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   Cardiac arrhythmia    Nonspecific ST T wave changes on EKG   Chronic venous insufficiency of lower extremity    Complication of anesthesia    nausea and vomiting   DDD (degenerative disc disease), lumbosacral    Essential hypertension    Headache    History of kidney stones    Hyperlipidemia    Kidney stones    Lymphedema    Migraines    Osteoporosis    PONV (postoperative nausea and vomiting)    Right ureteral stone    Vitamin B12 deficiency    Vitamin D deficiency    Past Surgical History:  Procedure Laterality Date   AUGMENTATION MAMMAPLASTY     CESAREAN SECTION     x 4   COLONOSCOPY WITH PROPOFOL  N/A 03/31/2022   Procedure: COLONOSCOPY WITH PROPOFOL ;  Surgeon: Onita Elspeth Sharper, DO;  Location: [facility];  Service: Gastroenterology;  Laterality: N/A;   CYSTOSCOPY W/ RETROGRADES Bilateral 07/10/2020   Procedure: CYSTOSCOPY WITH RETROGRADE PYELOGRAM;  Surgeon: Francisca Redell BROCKS, MD;  Location: [facility];  Service: Urology;  Laterality: Bilateral;   CYSTOSCOPY/URETEROSCOPY/HOLMIUM LASER/STENT PLACEMENT     CYSTOSCOPY/URETEROSCOPY/HOLMIUM LASER/STENT PLACEMENT Bilateral 07/10/2020   Procedure: CYSTOSCOPY/URETEROSCOPY/HOLMIUM LASER/STENT PLACEMENT;  Surgeon: Francisca Redell BROCKS, MD;   Location: [facility];  Service: Urology;  Laterality: Bilateral;   CYSTOSCOPY/URETEROSCOPY/HOLMIUM LASER/STENT PLACEMENT Right 08/25/2023   Procedure: CYSTOSCOPY/URETEROSCOPY/HOLMIUM LASER;  Surgeon: Francisca Redell BROCKS, MD;  Location: [facility];  Service: Urology;  Laterality: Right;   CYSTOSCOPY/URETEROSCOPY/HOLMIUM LASER/STENT PLACEMENT Right 12/15/2023   Procedure: CYSTOSCOPY/URETEROSCOPY/HOLMIUM LASER;  Surgeon: Francisca Redell BROCKS, MD;  Location: [facility];  Service: Urology;  Laterality: Right;   EXTRACORPOREAL SHOCK WAVE LITHOTRIPSY     x 10 plus   Eye Lift     FACIAL COSMETIC SURGERY     GANGLION CYST EXCISION Right 12/21/2021   Procedure: REMOVAL GANGLION OF WRIST;  Surgeon: Kathlynn Sharper, MD;  Location: [facility];  Service: Orthopedics;  Laterality: Right;   LIPOSUCTION     TONSILLECTOMY     TRIGGER FINGER RELEASE Right 12/21/2021   Procedure: RELEASE TRIGGER FINGER/A-1 PULLEY;  Surgeon: Kathlynn Sharper, MD;  Location: [facility];  Service: Orthopedics;  Laterality: Right;   URETEROSCOPY WITH HOLMIUM LASER LITHOTRIPSY     Patient Active Problem List   Diagnosis Date Noted   Acute CVA (cerebrovascular accident) (HCC) 12/19/2023   Hypokalemia 12/19/2023   AKI (acute kidney injury) 12/19/2023   Leukocytosis 12/19/2023   Hyperlipidemia, unspecified 12/19/2023   Essential hypertension 12/19/2023   Vision changes 12/19/2023   Lymphedema 05/22/2023   Chronic venous insufficiency 05/22/2023   Menopausal syndrome on  hormone replacement therapy 04/10/2023   Insomnia due to medical condition 04/25/2022   GAD (generalized anxiety disorder) 02/02/2022   Other specified depressive episodes 02/02/2022   Long-term current use of benzodiazepine 02/02/2022   Migraine with aura and without status migrainosus, not intractable 11/03/2020   Arrhythmia 08/06/2020   Status migrainosus 02/25/2019   Osteoporosis 04/09/2013   Vitamin D deficiency 04/09/2013   DDD (degenerative disc disease), lumbosacral  02/08/2005    PCP: Harvey Gaetana CROME, NP   REFERRING PROVIDER: Meeler, Benton CROME, FNP   REFERRING DIAG: M54.16 (ICD-10-CM) - Lumbar radiculitis   Rationale for Evaluation and Treatment: Rehabilitation  THERAPY DIAG:  Other low back pain  Pain in thoracic spine  Muscle weakness (generalized)  Other lack of coordination  ONSET DATE: 1.5 months   SUBJECTIVE:                                                                                                                                                                                           SUBJECTIVE STATEMENT: My back pain is some better since initiating PT. Services.  Just most having stiffness and not much pain today.   From Eval: Pt here to work on pain and discomfort in her mid back. Pt reports the more she is on her feet the more her back hurts and relief comes from being supine. Pt has not had any previous therapy for her back pain. Pt has epidural that assists with her pain below her belt line. Pt still in OT working on her upper extremity and grip strength. Pt has epidural shots for last 3 years. Pt initially hurt her back lifting something to put in her car. Patient can stand for maybe 30 min at a time before pain is no longer tolerable. She reports that sitting down does not improve her pain but lying on her side in would bring relief. Pt has history of osteoporosis and sliped disc in her back. Pt pain in this area is similar to pain she gets epidural for but just further superior into her thoracic spine.    PERTINENT HISTORY:  Patient familiar to this clinic before.  Was previously seen following CVA for mobility and balance deficits.  Still seeing speech and language pathologist and occupational therapy.  PAIN:  Are you having pain? Yes: NPRS scale: 3-10 ( at end of day everyday) Pain location: above belt line and radiating laterally  Pain description: nagging, throbbing pain  Aggravating factors:  Standing Relieving factors: Lying down  PRECAUTIONS: None  RED FLAGS: None   WEIGHT BEARING RESTRICTIONS: No  FALLS:  Has patient fallen in last 6 months? No  LIVING ENVIRONMENT: Lives with: lives with SO Lives in: House/apartment Stairs: No Has following equipment at home: None  OCCUPATION: Merchandiser, Retail   PLOF: Independent and Independent with basic ADLs  PATIENT GOALS: improve back pain   NEXT MD VISIT: Not scheduled   OBJECTIVE:   Note: Objective measures were completed at Evaluation unless otherwise noted.  DIAGNOSTIC FINDINGS:  No recent images since to 2023 were mild degenerative changes in the lumbar spine were noted  PATIENT SURVEYS:  Modified Oswestry:  MODIFIED OSWESTRY DISABILITY SCALE  Date: 04/25/24 Score  Pain intensity 5 =  Pain medication has no effect on my pain.  2. Personal care (washing, dressing, etc.) 0 =  I can take care of myself normally without causing increased pain.  3. Lifting 5 =  I cannot lift or carry anything at all.  4. Walking 3 =  Pain prevents me from walking more than  mile.  5. Sitting 3 =  Pain prevents me from sitting more than  hour.  6. Standing 2 =  Pain prevents me from standing more than 1 hour  7. Sleeping 1 = I can sleep well only by using pain medication.  8. Social Life 2 = Pain prevents me from participating in more energetic activities (eg. sports, dancing).  9. Traveling 2 =  My pain restricts my travel over 2 hours.  10. Employment/ Homemaking 3 = Pain prevents me from doing anything but light duties.  Total 26/50   Interpretation of scores: Score Category Description  0-20% Minimal Disability The patient can cope with most living activities. Usually no treatment is indicated apart from advice on lifting, sitting and exercise  21-40% Moderate Disability The patient experiences more pain and difficulty with sitting, lifting and standing. Travel and social life are more difficult and they may be disabled  from work. Personal care, sexual activity and sleeping are not grossly affected, and the patient can usually be managed by conservative means  41-60% Severe Disability Pain remains the main problem in this group, but activities of daily living are affected. These patients require a detailed investigation  61-80% Crippled Back pain impinges on all aspects of the patient's life. Positive intervention is required  81-100% Bed-bound These patients are either bed-bound or exaggerating their symptoms  Bluford FORBES Zoe DELENA Karon DELENA, et al. Surgery versus conservative management of stable thoracolumbar fracture: the PRESTO feasibility RCT. [place] ([place]): [employer]; 2021 Nov. [facility] Technology Assessment, No. 25.62.) Appendix 3, Oswestry Disability Index category descriptors. Available from: Findjewelers.cz  Minimally Clinically Important Difference (MCID) = 12.8%  COGNITION: Overall cognitive status: Within functional limits for tasks assessed     SENSATION: WFL  MUSCLE LENGTH: Hamstrings: WFL during DLLT   POSTURE: rounded shoulders  PALPATION: Tender to palpation along lumbar paraspinals, origin of gluteal muscles, and some along thoracic paraspinals.  Patient had most pain along lumbar paraspinals and gluteal musculature on the left greater than right side  LUMBAR ROM:   AROM eval  Flexion WNL  Extension Min - tight   Right lateral flexion To knee- tight  Left lateral flexion To knee - tight   Right rotation WNL- tight   Left rotation WNL tight    (Blank rows = not tested)  LOWER EXTREMITY ROM:    Further hip range of motion may be beneficial  LOWER EXTREMITY MMT:    MMT Right eval Left eval  Hip flexion 4 4  Hip extension    Hip abduction 4+ 4+  Hip adduction 5 5  Hip internal rotation    Hip external rotation    Knee flexion 5 5  Knee extension 5 5  Ankle dorsiflexion 5 5  Ankle plantarflexion    Ankle inversion    Ankle  eversion     (Blank rows = not tested) Measured in seated position   LUMBAR SPECIAL TESTS:  DLLT: could not sustain past 60 degrees  FUNCTIONAL TESTS:     TREATMENT DATE: 06/04/24   Manual therapy   STM to Bil thoracic paraspinals , bil QL, and gluteal origin x 10 min   Grade 2-3 thoracic CPA mobilizations, 30 sec per segment T4-T12    TE- To improve strength, endurance, mobility, and function of specific targeted muscle groups or improve joint range of motion or improve muscle flexibility  Nustep B UE and LE movement training with MHP on lower back throughout x6 min L 3-4 - denies pain.  SL open book stretch 2 x 10 ea side   LTR x 10 bil with 5-10 sec hold each side  Supine hooklye-hip mobilization - Feet apart x 20   Supine- LE on ball- knee to chest - slow and easy x 20 reps  Supine fig 4 with straight leg on theraball- self stretch- hold 10 sec x 10 ea side.   Sidelye (top leg on bolster) hip IR - foot off bolster 2 x 10 reps each side.   Sidelye open book x 15 reps each side.   Seated scap row RTB 2 x 10   Seated Shoulder horizontal ABD RTB 2 x 10   Seated Shoulder ER RTB 2 x 10 reps  Self care/home management: -Added some of above activities to HEP - See below and issued handout and Red and blue TB today.        PATIENT EDUCATION: Education details: POC Person educated: Patient Education method: Explanation Education comprehension: verbalized understanding   HOME EXERCISE PROGRAM:  Access Code: [email or web address] URL: [email or web address] Date: 06/04/2024 Prepared by: Reyes London  Exercises - Clamshell  - 1 x daily - 7 x weekly - 3 sets - 10 reps - Bird Dog Progression  - 1 x daily - 7 x weekly - 1 sets - 10 reps - Sidelying Thoracic Rotation with Open Book  - 1 x daily - 7 x weekly - 1 sets - 10 reps - 5 sec hold - Seated Shoulder Row with Anchored Resistance  - 3 x weekly - 3 sets - 10 reps - Seated Shoulder Horizontal Abduction  with Resistance  - 3 x weekly - 3 sets - 10 reps - Shoulder External Rotation and Scapular Retraction with Resistance  - 3 x weekly - 3 sets - 10 reps    Access Code: [email or web address] URL: [email or web address] Date: 04/30/2024 Prepared by: Lonni Gainer  Exercises - Clamshell  - 1 x daily - 7 x weekly - 3 sets - 10 reps - Bird Dog Progression  - 1 x daily - 7 x weekly - 1 sets - 10 reps - Sidelying Thoracic Rotation with Open Book  - 1 x daily - 7 x weekly - 1 sets - 10 reps - 5 sec hold   ASSESSMENT:  CLINICAL IMPRESSION: Patient was complaining of Hip stiffness and responded well to hip/low back mobility activities with reported feeling of relief and then able to progress with postural training today. Added resistive postural strengthening and added to HEP without incidence today. Patient able to follow all VC with new activities and perform  safely for HEP. Pt will continue to benefit from skilled physical therapy intervention to address impairments, improve QOL, and attain therapy goals.    OBJECTIVE IMPAIRMENTS: Abnormal gait.   ACTIVITY LIMITATIONS: carrying, standing, squatting, and locomotion level  PARTICIPATION LIMITATIONS: meal prep, cleaning, shopping, community activity, and occupation  PERSONAL FACTORS: Past/current experiences, Time since onset of injury/illness/exacerbation, and 3+ comorbidities: HTN, HLD, osteoporosis, B12 deficiency are also affecting patient's functional outcome.   REHAB POTENTIAL: Good  CLINICAL DECISION MAKING: Stable/uncomplicated  EVALUATION COMPLEXITY: Low   GOALS: Goals reviewed with patient? Yes  SHORT TERM GOALS: Target date: 05/23/2024       Patient will be independent in home exercise program to improve strength/mobility for better functional independence with ADLs. Baseline: No HEP currently  Goal status: INITIAL   LONG TERM GOALS: Target date: 06/19/2024    Patient will improve modified Oswestry pain  disability questionnaire by 10 points or greater in order to indicate improvement in her subjective level of back pain as well as her ability to complete functional tasks without pain limitations Baseline: 26 Goal status: INITIAL  2.  Patient will improve double leg limb lowering test from 60 degrees to 30 degrees or greater showing good core control throughout in order to indicate improved strength of anterior core muscles for improved lumbar stability Baseline: 60 degrees patient loses control and has some discomfort Goal status: INITIAL  3.  Patient will report ability to stand for greater than 1 hour without limitations of pain over to allow her to return to work-related tasks as well as shopping related tasks with less limitation Baseline: When patient stands for 30 minutes per her report she starts to get intense pain that is only relieved by lying down Goal status: INITIAL  4.  Patient report ability to lift light objects from the floor and set them on an object such as a table in order without increase in back pain to show improved functional capacity as well as improved ability to return to work-related tasks Baseline: Patient reports she cannot lift or carry anything at all  Goal status: INITIAL    PLAN:  PT FREQUENCY: 2x/week  PT DURATION: 8 weeks  PLANNED INTERVENTIONS: 97750- Physical Performance Testing, 97110-Therapeutic exercises, 97530- Therapeutic activity, [id number]- Neuromuscular re-education, 97535- Self Care, [id number]- Manual therapy, [id number]- Gait training, [phone] (1-2 muscles), [id number] (3+ muscles)- Dry Needling, Patient/Family education, Balance training, Stair training, and Moist heat.  PLAN FOR NEXT SESSION:   Progress postural strengthening/awareness Reassess hip mobility following previous session. Standing endurance - Standing side plank and bird dog on wall    Reyes LOISE London, PT 06/04/2024, 3:52 PM

## 2024-06-04 ENCOUNTER — Encounter

## 2024-06-04 ENCOUNTER — Other Ambulatory Visit: Payer: Self-pay

## 2024-06-04 ENCOUNTER — Encounter: Payer: Self-pay | Admitting: Psychiatry

## 2024-06-04 ENCOUNTER — Ambulatory Visit

## 2024-06-04 ENCOUNTER — Ambulatory Visit: Admitting: Occupational Therapy

## 2024-06-04 ENCOUNTER — Ambulatory Visit: Admitting: Physical Therapy

## 2024-06-04 ENCOUNTER — Ambulatory Visit: Admitting: Psychiatry

## 2024-06-04 VITALS — BP 110/60 | HR 83 | Temp 97.7°F | Ht 62.0 in | Wt 117.8 lb

## 2024-06-04 DIAGNOSIS — F3289 Other specified depressive episodes: Secondary | ICD-10-CM

## 2024-06-04 DIAGNOSIS — Z79899 Other long term (current) drug therapy: Secondary | ICD-10-CM

## 2024-06-04 DIAGNOSIS — F411 Generalized anxiety disorder: Secondary | ICD-10-CM

## 2024-06-04 DIAGNOSIS — G4701 Insomnia due to medical condition: Secondary | ICD-10-CM

## 2024-06-04 MED ORDER — LORAZEPAM 0.5 MG PO TABS: 0.5000 mg | ORAL_TABLET | ORAL | 2 refills | Status: AC

## 2024-06-04 MED ORDER — ZOLPIDEM TARTRATE 10 MG PO TABS: 10.0000 mg | ORAL_TABLET | Freq: Every day | ORAL | 2 refills | Status: AC

## 2024-06-04 NOTE — Progress Notes (Unsigned)
 [facility] MD OP Progress Note  06/04/2024 3:36 PM Ashley Collins  MRN:  [id number]  Chief Complaint:  Chief Complaint  Patient presents with   Follow-up   Anxiety   Insomnia   Medication Refill   Discussed the use of AI scribe software for clinical note transcription with the patient, who gave verbal consent to proceed.  History of Present Illness Ashley Collins is a 63 year old Caucasian female, who is currently on FMLA, lives in [place], has a history of GAD, other specified depression, insomnia, long-term current use of benzodiazepines, migraine headaches, kidney stones was evaluated in office today for a follow-up appointment.  Mild anxiety, particularly related to recovery from a recent stroke and the prospect of returning to work, affects her daily life. Her employer is allowing her to gradually increase her work hours. She focuses on making her life easier and reducing stress to aid her recovery.  She has experienced persistent cognitive changes since her stroke, including difficulties with short-term memory and reading. She states that her long-term memory remains intact, but her short-term memory is impaired, though she is beginning to retain more information. She faces challenges with writing, reading, and scanning her environment due to loss of side vision, and she notes that therapy has helped her relearn these skills. She finds she can remember more items when interested in the subject matter. She continues to participate in cognitive rehabilitation exercises, such as reading articles, completing puzzles, and practicing memory recall with her therapist. She experiences ongoing brain fog during the day but feels she is making progress with therapy.  She appeared to be alert, oriented to person place time and situation during the session.  A slums was completed in session and she scored well.  She denies any perceptual disturbances, suicidality or homicidality.    She uses lorazepam  for  anxiety and states that she limits her use and does not overuse the medication.  She attends ongoing physical therapy twice weekly to address motor deficits in her hand and arm, along with vision rehabilitation. She notes improvement in her eyesight and independence, though she continues to experience loss of peripheral vision and does not yet feel comfortable driving. She relies on her husband and other supports for transportation.  She is currently employed at [employer] and plans to return to work with a gradual increase in hours after medical leave.    Visit Diagnosis:    ICD-10-CM   1. GAD (generalized anxiety disorder)  F41.1 LORazepam  (ATIVAN ) 0.5 MG tablet    2. Other specified depressive episodes  F32.89    Depression episode with insufficient symptoms    3. Insomnia due to medical condition  G47.01 zolpidem  (AMBIEN ) 10 MG tablet   mood, headaches    4. Long-term current use of benzodiazepine  Z79.899       Past Psychiatric History: Have reviewed past psychiatric history from progress note on 02/02/2022.  Past trials of medications like Celexa , Xanax , Ambien , Lamictal , propranolol , Vistaril .  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   Cardiac arrhythmia    Nonspecific ST T wave changes on EKG   Chronic venous insufficiency of lower extremity    Complication of anesthesia    nausea and vomiting   DDD (degenerative disc disease), lumbosacral    Essential hypertension    Headache    History of kidney stones  Hyperlipidemia    Kidney stones    Lymphedema    Migraines    Osteoporosis    PONV (postoperative nausea and vomiting)    Right ureteral stone    Vitamin B12 deficiency    Vitamin D deficiency     Past Surgical History:  Procedure Laterality Date   AUGMENTATION MAMMAPLASTY     CESAREAN SECTION     x 4   COLONOSCOPY WITH PROPOFOL  N/A 03/31/2022   Procedure:  COLONOSCOPY WITH PROPOFOL ;  Surgeon: Onita Elspeth Sharper, DO;  [facility] ENDOSCOPY;  Service: Gastroenterology;  Laterality: N/A;   CYSTOSCOPY W/ RETROGRADES Bilateral 07/10/2020   Procedure: CYSTOSCOPY WITH RETROGRADE PYELOGRAM;  Surgeon: Francisca Redell BROCKS, MD;  Location: [facility] ORS;  Service: Urology;  Laterality: Bilateral;   CYSTOSCOPY/URETEROSCOPY/HOLMIUM LASER/STENT PLACEMENT     CYSTOSCOPY/URETEROSCOPY/HOLMIUM LASER/STENT PLACEMENT Bilateral 07/10/2020   Procedure: CYSTOSCOPY/URETEROSCOPY/HOLMIUM LASER/STENT PLACEMENT;  Surgeon: Francisca Redell BROCKS, MD;  Location: [facility] ORS;  Service: Urology;  Laterality: Bilateral;   CYSTOSCOPY/URETEROSCOPY/HOLMIUM LASER/STENT PLACEMENT Right 08/25/2023   Procedure: CYSTOSCOPY/URETEROSCOPY/HOLMIUM LASER;  Surgeon: Francisca Redell BROCKS, MD;  Location: [facility] ORS;  Service: Urology;  Laterality: Right;   CYSTOSCOPY/URETEROSCOPY/HOLMIUM LASER/STENT PLACEMENT Right 12/15/2023   Procedure: CYSTOSCOPY/URETEROSCOPY/HOLMIUM LASER;  Surgeon: Francisca Redell BROCKS, MD;  Location: [facility] ORS;  Service: Urology;  Laterality: Right;   EXTRACORPOREAL SHOCK WAVE LITHOTRIPSY     x 10 plus   Eye Lift     FACIAL COSMETIC SURGERY     GANGLION CYST EXCISION Right 12/21/2021   Procedure: REMOVAL GANGLION OF WRIST;  Surgeon: Kathlynn Sharper, MD;  Location: [facility] ORS;  Service: Orthopedics;  Laterality: Right;   LIPOSUCTION     TONSILLECTOMY     TRIGGER FINGER RELEASE Right 12/21/2021   Procedure: RELEASE TRIGGER FINGER/A-1 PULLEY;  Surgeon: Kathlynn Sharper, MD;  Location: [facility] ORS;  Service: Orthopedics;  Laterality: Right;   URETEROSCOPY WITH HOLMIUM LASER LITHOTRIPSY      Family Psychiatric History: I have reviewed family psychiatric history from progress note on 02/02/2022.  Family History:  Family History  Problem Relation Age of Onset   Drug abuse Daughter    Depression Daughter    Depression Son    Bladder Cancer Neg Hx    Kidney cancer Neg Hx    Prostate cancer Neg Hx      Social History: I have reviewed social history from progress note on 02/02/2022. Social History   Socioeconomic History   Marital status: Married    Spouse name: Lynwood   Number of children: 4   Years of education: Not on file   Highest education level: High school graduate  Occupational History   Not on file  Tobacco Use   Smoking status: Never    Passive exposure: Never   Smokeless tobacco: Never  Vaping Use   Vaping status: Never Used  Substance and Sexual Activity   Alcohol use: Not Currently   Drug use: Not Currently   Sexual activity: Yes  Other Topics Concern   Not on file  Social History Narrative   Not on file   Social Drivers of Health   Financial Resource Strain: Low Risk  (03/19/2024)   Received from [facility]   Overall Financial Resource Strain (CARDIA)    Difficulty of Paying Living Expenses: Not hard at all  Food Insecurity: No Food Insecurity (03/19/2024)   Received from [facility]   Hunger Vital Sign    Within the past 12 months, you worried that your food would run  out before you got the money to buy more.: Never true    Within the past 12 months, the food you bought just didn't last and you didn't have money to get more.: Never true  Transportation Needs: No Transportation Needs (03/19/2024)   Received from [facility] - Transportation    In the past 12 months, has lack of transportation kept you from medical appointments or from getting medications?: No    Lack of Transportation (Non-Medical): No  Physical Activity: Not on file  Stress: Not on file  Social Connections: Moderately Integrated (12/19/2023)   Social Connection and Isolation Panel    Frequency of Communication with Friends and Family: More than three times a week    Frequency of Social Gatherings with Friends and Family: Once a week    Attends Religious Services: Never    Database Administrator or Organizations: Yes     Attends Engineer, Structural: More than 4 times per year    Marital Status: Married    Allergies:  Allergies  Allergen Reactions   Percocet [Oxycodone -Acetaminophen ] Itching    Pt tolerates both oxycodone  and tylenol  individually     Metabolic Disorder Labs: Lab Results  Component Value Date   HGBA1C 4.6 (L) 12/18/2023   MPG 85.32 12/18/2023   No results found for: PROLACTIN Lab Results  Component Value Date   CHOL 235 (H) 12/18/2023   TRIG 248 (H) 12/18/2023   HDL 50 12/18/2023   CHOLHDL 4.7 12/18/2023   VLDL 50 (H) 12/18/2023   LDLCALC 135 (H) 12/18/2023   Lab Results  Component Value Date   TSH 1.455 12/18/2023   TSH 2.743 03/08/2022    Therapeutic Level Labs: No results found for: LITHIUM No results found for: VALPROATE No results found for: CBMZ  Current Medications: Current Outpatient Medications  Medication Sig Dispense Refill   amitriptyline  (ELAVIL ) 75 MG tablet Take 75 mg by mouth at bedtime.     aspirin  EC 81 MG tablet Take 1 tablet (81 mg total) by mouth daily. Swallow whole. 30 tablet 12   atorvastatin  (LIPITOR) 40 MG tablet Take 1 tablet (40 mg total) by mouth daily. 90 tablet 0   Biotin 10 MG CAPS Take by mouth.     [START ON 06/14/2024] LORazepam  (ATIVAN ) 0.5 MG tablet Take 1 tablet (0.5 mg total) by mouth as directed. TAKE 1 TABLET 3-4 TIMES A WEEK ONLY FOR SEVERE ANXIETY ATTACKS. PLEASE LIMIT USE, 26 TABLETS MUST LAST 30 DAYS 26 tablet 2   olmesartan (BENICAR) 5 MG tablet Take 5 mg by mouth daily.     OnabotulinumtoxinA (BOTOX IM) Inject 1 Dose into the muscle every 3 (three) months.     ondansetron  (ZOFRAN -ODT) 8 MG disintegrating tablet Take 8 mg by mouth 3 (three) times daily.     solifenacin  (VESICARE ) 5 MG tablet Take 1 tablet (5 mg total) by mouth daily. 30 tablet 11   tretinoin  (RETIN-A ) 0.025 % cream Apply topically at bedtime. Apply 2-3 times weekly at night to dry skin after cleaning. Increase frequency up to nightly as  tolerated. 20 g 5   [START ON 06/14/2024] zolpidem  (AMBIEN ) 10 MG tablet Take 1 tablet (10 mg total) by mouth at bedtime. 30 tablet 2   No current facility-administered medications for this visit.     Musculoskeletal: Strength & Muscle Tone: within normal limits Gait & Station: normal Patient leans: N/A  Psychiatric Specialty Exam: Review of Systems  Psychiatric/Behavioral: Negative.  Blood pressure 110/60, pulse 83, temperature 97.7 F (36.5 C), temperature source Temporal, height 5' 2 (1.575 m), weight 117 lb 12.8 oz (53.4 kg).Body mass index is 21.55 kg/m.  General Appearance: Casual  Eye Contact:  Fair  Speech:  Clear and Coherent  Volume:  Normal  Mood:  Euthymic  Affect:  Congruent  Thought Process:  Goal Directed and Descriptions of Associations: Intact  Orientation:  Full (Time, Place, and Person)  Thought Content: Logical   Suicidal Thoughts:  No  Homicidal Thoughts:  No  Memory:  Immediate;   Fair Recent;   Fair Remote;   Fair  Judgement:  Fair  Insight:  Fair  Psychomotor Activity:  Normal  Concentration:  Concentration: Fair and Attention Span: Fair  Recall:  [employer] of Knowledge: Fair  Language: Fair  Akathisia:  No  Handed:  Right  AIMS (if indicated): not done  Assets:  Communication Skills Desire for Improvement Housing Social Support Talents/Skills  ADL's:  Intact  Cognition: WNL  Sleep:  Fair   Screenings: GAD-7    Garment/textile Technologist Visit from 04/03/2023 in [facility] Office Visit from 07/25/2022 in [facility] Office Visit from 04/25/2022 in [facility] Office Visit from 03/14/2022 in [facility] Office Visit from 02/02/2022 in [facility]  Total GAD-7 Score 18 0 5 20 18    PHQ2-9    Flowsheet Row Office Visit from 04/03/2023 in  [facility] Office Visit from 07/25/2022 in [facility] Office Visit from 04/25/2022 in [facility] Office Visit from 02/02/2022 in [facility]  PHQ-2 Total Score 0 0 0 3  PHQ-9 Total Score -- 1 2 13    Flowsheet Row Video Visit from 02/22/2024 in [facility] Video Visit from 01/18/2024 in [facility] ED from 01/01/2024 in [facility] Emergency Department at [facility]  C-SSRS RISK CATEGORY No Risk No Risk No Risk     Assessment and Plan: Tea Collums is a 63 year old Caucasian female who presented for follow-up appointment, discussed assessment and plan as noted below.  1. GAD (generalized anxiety disorder)-improving Currently reports overall anxiety symptoms is well-managed. Continue Lorazepam  0.5 mg as needed for severe anxiety attacks only. Continue Amitriptyline  75 mg at bedtime, prescribed by neurology.  2. Other specified depressive episodes in remission Denies any depression symptoms Continue Amitriptyline  as prescribed  3. Insomnia due to medical condition-stable Currently denies any sleep problems. Continue Ambien  10 mg at bedtime Continue sleep hygiene techniques  4. Long-term current use of benzodiazepine Currently reports limiting the amount of Lorazepam  and uses it as needed Will monitor closely.  A SLUMS was completed in session today and she scored 29 out of 30.  Patient to continue follow-up with neurology.  Follow-up Follow-up in clinic in 3 months or sooner if needed.    Collaboration of Care: Collaboration of Care: Other discussed establishing care with a psychotherapist to start CBT.  Patient will let this provider know if interested.  Patient/Guardian was advised Release of Information must be obtained prior to any  record release in order to collaborate their care with an outside provider. Patient/Guardian was advised if they have not already done so to contact the registration department to sign all necessary forms in order for us  to release information  regarding their care.   Consent: Patient/Guardian gives verbal consent for treatment and assignment of benefits for services provided during this visit. Patient/Guardian expressed understanding and agreed to proceed.  This note was generated in part or whole with voice recognition software. Voice recognition is usually quite accurate but there are transcription errors that can and very often do occur. I apologize for any typographical errors that were not detected and corrected.     Ivannia Willhelm, MD 06/04/2024, 3:36 PM

## 2024-06-05 ENCOUNTER — Ambulatory Visit: Admitting: Occupational Therapy

## 2024-06-05 ENCOUNTER — Ambulatory Visit: Admitting: Physical Therapy

## 2024-06-05 ENCOUNTER — Ambulatory Visit

## 2024-06-05 DIAGNOSIS — M6281 Muscle weakness (generalized): Secondary | ICD-10-CM

## 2024-06-05 DIAGNOSIS — R41841 Cognitive communication deficit: Secondary | ICD-10-CM | POA: Diagnosis not present

## 2024-06-05 DIAGNOSIS — M5459 Other low back pain: Secondary | ICD-10-CM

## 2024-06-05 DIAGNOSIS — R278 Other lack of coordination: Secondary | ICD-10-CM

## 2024-06-05 DIAGNOSIS — M546 Pain in thoracic spine: Secondary | ICD-10-CM

## 2024-06-05 NOTE — Therapy (Signed)
 OUTPATIENT PHYSICAL THERAPY THORACOLUMBAR TREATMENT   Patient Name: Ashley Collins MRN: [id number] DOB:1960-12-10, 63 y.o., female Today's Date: 06/05/2024  END OF SESSION:  PT End of Session - 06/05/24 1321     Visit Number 7    Number of Visits 16    Date for Recertification  06/20/24    Progress Note Due on Visit 10    PT Start Time 1317    PT Stop Time 1358    PT Time Calculation (min) 41 min    Equipment Utilized During Treatment Gait belt    Activity Tolerance Patient tolerated treatment well    Behavior During Therapy WFL for tasks assessed/performed             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   Cardiac arrhythmia    Nonspecific ST T wave changes on EKG   Chronic venous insufficiency of lower extremity    Complication of anesthesia    nausea and vomiting   DDD (degenerative disc disease), lumbosacral    Essential hypertension    Headache    History of kidney stones    Hyperlipidemia    Kidney stones    Lymphedema    Migraines    Osteoporosis    PONV (postoperative nausea and vomiting)    Right ureteral stone    Vitamin B12 deficiency    Vitamin D deficiency    Past Surgical History:  Procedure Laterality Date   AUGMENTATION MAMMAPLASTY     CESAREAN SECTION     x 4   COLONOSCOPY WITH PROPOFOL  N/A 03/31/2022   Procedure: COLONOSCOPY WITH PROPOFOL ;  Surgeon: Onita Elspeth Sharper, DO;  Location: [facility];  Service: Gastroenterology;  Laterality: N/A;   CYSTOSCOPY W/ RETROGRADES Bilateral 07/10/2020   Procedure: CYSTOSCOPY WITH RETROGRADE PYELOGRAM;  Surgeon: Francisca Redell BROCKS, MD;  Location: [facility];  Service: Urology;  Laterality: Bilateral;   CYSTOSCOPY/URETEROSCOPY/HOLMIUM LASER/STENT PLACEMENT     CYSTOSCOPY/URETEROSCOPY/HOLMIUM LASER/STENT PLACEMENT Bilateral 07/10/2020   Procedure: CYSTOSCOPY/URETEROSCOPY/HOLMIUM LASER/STENT PLACEMENT;  Surgeon: Francisca Redell BROCKS,  MD;  Location: [facility];  Service: Urology;  Laterality: Bilateral;   CYSTOSCOPY/URETEROSCOPY/HOLMIUM LASER/STENT PLACEMENT Right 08/25/2023   Procedure: CYSTOSCOPY/URETEROSCOPY/HOLMIUM LASER;  Surgeon: Francisca Redell BROCKS, MD;  Location: [facility];  Service: Urology;  Laterality: Right;   CYSTOSCOPY/URETEROSCOPY/HOLMIUM LASER/STENT PLACEMENT Right 12/15/2023   Procedure: CYSTOSCOPY/URETEROSCOPY/HOLMIUM LASER;  Surgeon: Francisca Redell BROCKS, MD;  Location: [facility];  Service: Urology;  Laterality: Right;   EXTRACORPOREAL SHOCK WAVE LITHOTRIPSY     x 10 plus   Eye Lift     FACIAL COSMETIC SURGERY     GANGLION CYST EXCISION Right 12/21/2021   Procedure: REMOVAL GANGLION OF WRIST;  Surgeon: Kathlynn Sharper, MD;  Location: [facility];  Service: Orthopedics;  Laterality: Right;   LIPOSUCTION     TONSILLECTOMY     TRIGGER FINGER RELEASE Right 12/21/2021   Procedure: RELEASE TRIGGER FINGER/A-1 PULLEY;  Surgeon: Kathlynn Sharper, MD;  Location: [facility];  Service: Orthopedics;  Laterality: Right;   URETEROSCOPY WITH HOLMIUM LASER LITHOTRIPSY     Patient Active Problem List   Diagnosis Date Noted   Acute CVA (cerebrovascular accident) (HCC) 12/19/2023   Hypokalemia 12/19/2023   AKI (acute kidney injury) 12/19/2023   Leukocytosis 12/19/2023   Hyperlipidemia, unspecified 12/19/2023   Essential hypertension 12/19/2023   Vision changes 12/19/2023   Lymphedema 05/22/2023   Chronic venous insufficiency 05/22/2023   Menopausal syndrome  on hormone replacement therapy 04/10/2023   Insomnia due to medical condition 04/25/2022   GAD (generalized anxiety disorder) 02/02/2022   Other specified depressive episodes 02/02/2022   Long-term current use of benzodiazepine 02/02/2022   Migraine with aura and without status migrainosus, not intractable 11/03/2020   Arrhythmia 08/06/2020   Status migrainosus 02/25/2019   Osteoporosis 04/09/2013   Vitamin D deficiency 04/09/2013   DDD (degenerative disc disease), lumbosacral  02/08/2005    PCP: Harvey Gaetana CROME, NP   REFERRING PROVIDER: Meeler, Benton CROME, FNP   REFERRING DIAG: M54.16 (ICD-10-CM) - Lumbar radiculitis   Rationale for Evaluation and Treatment: Rehabilitation  THERAPY DIAG:  Other low back pain  Muscle weakness (generalized)  Pain in thoracic spine  ONSET DATE: 1.5 months   SUBJECTIVE:                                                                                                                                                                                           SUBJECTIVE STATEMENT:  Pt back is aggravated this date. Did some of the activities to improve pain last night and was helpful but still tighter than normal.  From Eval: Pt here to work on pain and discomfort in her mid back. Pt reports the more she is on her feet the more her back hurts and relief comes from being supine. Pt has not had any previous therapy for her back pain. Pt has epidural that assists with her pain below her belt line. Pt still in OT working on her upper extremity and grip strength. Pt has epidural shots for last 3 years. Pt initially hurt her back lifting something to put in her car. Patient can stand for maybe 30 min at a time before pain is no longer tolerable. She reports that sitting down does not improve her pain but lying on her side in would bring relief. Pt has history of osteoporosis and sliped disc in her back. Pt pain in this area is similar to pain she gets epidural for but just further superior into her thoracic spine.    PERTINENT HISTORY:  Patient familiar to this clinic before.  Was previously seen following CVA for mobility and balance deficits.  Still seeing speech and language pathologist and occupational therapy.  PAIN:  Are you having pain? Yes: NPRS scale: 3-10 ( at end of day everyday) Pain location: above belt line and radiating laterally  Pain description: nagging, throbbing pain  Aggravating factors: Standing Relieving  factors: Lying down  PRECAUTIONS: None  RED FLAGS: None   WEIGHT BEARING RESTRICTIONS: No  FALLS:  Has patient fallen in last 6 months? No  LIVING ENVIRONMENT: Lives with: lives with SO Lives in: House/apartment Stairs: No Has following equipment at home: None  OCCUPATION: Merchandiser, Retail   PLOF: Independent and Independent with basic ADLs  PATIENT GOALS: improve back pain   NEXT MD VISIT: Not scheduled   OBJECTIVE:   Note: Objective measures were completed at Evaluation unless otherwise noted.  DIAGNOSTIC FINDINGS:  No recent images since to 2023 were mild degenerative changes in the lumbar spine were noted  PATIENT SURVEYS:  Modified Oswestry:  MODIFIED OSWESTRY DISABILITY SCALE  Date: 04/25/24 Score  Pain intensity 5 =  Pain medication has no effect on my pain.  2. Personal care (washing, dressing, etc.) 0 =  I can take care of myself normally without causing increased pain.  3. Lifting 5 =  I cannot lift or carry anything at all.  4. Walking 3 =  Pain prevents me from walking more than  mile.  5. Sitting 3 =  Pain prevents me from sitting more than  hour.  6. Standing 2 =  Pain prevents me from standing more than 1 hour  7. Sleeping 1 = I can sleep well only by using pain medication.  8. Social Life 2 = Pain prevents me from participating in more energetic activities (eg. sports, dancing).  9. Traveling 2 =  My pain restricts my travel over 2 hours.  10. Employment/ Homemaking 3 = Pain prevents me from doing anything but light duties.  Total 26/50   Interpretation of scores: Score Category Description  0-20% Minimal Disability The patient can cope with most living activities. Usually no treatment is indicated apart from advice on lifting, sitting and exercise  21-40% Moderate Disability The patient experiences more pain and difficulty with sitting, lifting and standing. Travel and social life are more difficult and they may be disabled from work. Personal  care, sexual activity and sleeping are not grossly affected, and the patient can usually be managed by conservative means  41-60% Severe Disability Pain remains the main problem in this group, but activities of daily living are affected. These patients require a detailed investigation  61-80% Crippled Back pain impinges on all aspects of the patients life. Positive intervention is required  81-100% Bed-bound These patients are either bed-bound or exaggerating their symptoms  Bluford FORBES Zoe DELENA Karon DELENA, et al. Surgery versus conservative management of stable thoracolumbar fracture: the PRESTO feasibility RCT. [place] ([place]): [employer]; 2021 Nov. [facility] Technology Assessment, No. 25.62.) Appendix 3, Oswestry Disability Index category descriptors. Available from: [email or web address].cz  Minimally Clinically Important Difference (MCID) = 12.8%  COGNITION: Overall cognitive status: Within functional limits for tasks assessed     SENSATION: WFL  MUSCLE LENGTH: Hamstrings: WFL during DLLT   POSTURE: rounded shoulders  PALPATION: Tender to palpation along lumbar paraspinals, origin of gluteal muscles, and some along thoracic paraspinals.  Patient had most pain along lumbar paraspinals and gluteal musculature on the left greater than right side  LUMBAR ROM:   AROM eval  Flexion WNL  Extension Min - tight   Right lateral flexion To knee- tight  Left lateral flexion To knee - tight   Right rotation WNL- tight   Left rotation WNL tight    (Blank rows = not tested)  LOWER EXTREMITY ROM:    Further hip range of motion may be beneficial  LOWER EXTREMITY MMT:    MMT Right eval Left eval  Hip flexion 4 4  Hip extension    Hip abduction 4+ 4+  Hip adduction 5 5  Hip internal rotation    Hip external rotation    Knee flexion 5 5  Knee extension 5 5  Ankle dorsiflexion 5 5  Ankle plantarflexion    Ankle inversion    Ankle eversion      (Blank rows = not tested) Measured in seated position   LUMBAR SPECIAL TESTS:  DLLT: could not sustain past 60 degrees  FUNCTIONAL TESTS:     TREATMENT DATE: 06/05/24   Manual therapy   STM to Bil thoracic paraspinals x 10 min   Grade 2-3 thoracic CPA mobilizations, 30 sec per segment T4-T12    TE- To improve strength, endurance, mobility, and function of specific targeted muscle groups or improve joint range of motion or improve muscle flexibility  Nustep B UE and LE movement training with MHP on lower back throughout x min L 3-4 - denies pain.   SL open book stretch 2 x 10 ea side - cues for longer hold time   Bridge with BTB around knees 2 x 10 reps - good slow and controlled movements   Sidelye open book x 15 reps each side.   Seated scap row BTB 2 x 15   Standing shoulder extension 2 x 15 BTB  PATIENT EDUCATION: Education details: POC Person educated: Patient Education method: Explanation Education comprehension: verbalized understanding   HOME EXERCISE PROGRAM:  Access Code: [email or web address] URL: [email or web address] Date: 06/04/2024 Prepared by: Reyes London  Exercises - Clamshell  - 1 x daily - 7 x weekly - 3 sets - 10 reps - Bird Dog Progression  - 1 x daily - 7 x weekly - 1 sets - 10 reps - Sidelying Thoracic Rotation with Open Book  - 1 x daily - 7 x weekly - 1 sets - 10 reps - 5 sec hold - Seated Shoulder Row with Anchored Resistance  - 3 x weekly - 3 sets - 10 reps - Seated Shoulder Horizontal Abduction with Resistance  - 3 x weekly - 3 sets - 10 reps - Shoulder External Rotation and Scapular Retraction with Resistance  - 3 x weekly - 3 sets - 10 reps    Access Code: [email or web address] URL: [email or web address] Date: 04/30/2024 Prepared by: Lonni Gainer  Exercises - Clamshell  - 1 x daily - 7 x weekly - 3 sets - 10 reps - Bird Dog Progression  - 1 x daily - 7 x weekly - 1 sets - 10 reps - Sidelying Thoracic Rotation  with Open Book  - 1 x daily - 7 x weekly - 1 sets - 10 reps - 5 sec hold   ASSESSMENT:  CLINICAL IMPRESSION:  Patient arrived with good motivation for completion of pt activities. Pt has increased T spine symptoms this date so interventions were focussed on this region with manual and other exercises. Pt reports improvement post session and has notable lack of Lumbar spine pain this date. Pt will continue to benefit from skilled physical therapy intervention to address impairments, improve QOL, and attain therapy goals.     OBJECTIVE IMPAIRMENTS: Abnormal gait.   ACTIVITY LIMITATIONS: carrying, standing, squatting, and locomotion level  PARTICIPATION LIMITATIONS: meal prep, cleaning, shopping, community activity, and occupation  PERSONAL FACTORS: Past/current experiences, Time since onset of injury/illness/exacerbation, and 3+ comorbidities: HTN, HLD, osteoporosis, B12 deficiency are also affecting patient's functional outcome.   REHAB POTENTIAL: Good  CLINICAL DECISION MAKING: Stable/uncomplicated  EVALUATION COMPLEXITY: Low   GOALS: Goals reviewed with patient? Yes  SHORT TERM GOALS: Target date:  05/23/2024   Patient will be independent in home exercise program to improve strength/mobility for better functional independence with ADLs. Baseline: No HEP currently  Goal status: INITIAL   LONG TERM GOALS: Target date: 06/19/2024  Patient will improve modified Oswestry pain disability questionnaire by 10 points or greater in order to indicate improvement in her subjective level of back pain as well as her ability to complete functional tasks without pain limitations Baseline: 26 Goal status: INITIAL  2.  Patient will improve double leg limb lowering test from 60 degrees to 30 degrees or greater showing good core control throughout in order to indicate improved strength of anterior core muscles for improved lumbar stability Baseline: 60 degrees patient loses control and has  some discomfort Goal status: INITIAL  3.  Patient will report ability to stand for greater than 1 hour without limitations of pain over to allow her to return to work-related tasks as well as shopping related tasks with less limitation Baseline: When patient stands for 30 minutes per her report she starts to get intense pain that is only relieved by lying down Goal status: INITIAL  4.  Patient report ability to lift light objects from the floor and set them on an object such as a table in order without increase in back pain to show improved functional capacity as well as improved ability to return to work-related tasks Baseline: Patient reports she cannot lift or carry anything at all  Goal status: INITIAL    PLAN:  PT FREQUENCY: 2x/week  PT DURATION: 8 weeks  PLANNED INTERVENTIONS: 97750- Physical Performance Testing, 97110-Therapeutic exercises, 97530- Therapeutic activity, [id number]- Neuromuscular re-education, 97535- Self Care, [id number]- Manual therapy, [id number]- Gait training, [phone] (1-2 muscles), [id number] (3+ muscles)- Dry Needling, Patient/Family education, Balance training, Stair training, and Moist heat.  PLAN FOR NEXT SESSION:   Progress postural strengthening/awareness Reassess hip mobility following previous session. Standing endurance - Standing side plank and bird dog on wall   Lonni KATHEE Gainer, PT 06/05/2024, 1:22 PM

## 2024-06-05 NOTE — Therapy (Addendum)
 Occupational Therapy Neuro Treatment Note   Patient Name: Ashley Collins MRN: [id number] DOB:12/07/1960, 63 y.o., female Today's Date: 06/05/2024  PCP: Harvey Gaetana CROME, NP REFERRING PROVIDER: Harvey Gaetana CROME, NP   OT End of Session - 06/05/24 1406     Visit Number 47    Number of Visits 72    Date for Recertification  08/06/24    OT Start Time 1400    OT Stop Time 1445    OT Time Calculation (min) 45 min    Activity Tolerance Patient tolerated treatment well    Behavior During Therapy WFL for tasks assessed/performed               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   Cardiac arrhythmia    Nonspecific ST T wave changes on EKG   Chronic venous insufficiency of lower extremity    Complication of anesthesia    nausea and vomiting   DDD (degenerative disc disease), lumbosacral    Essential hypertension    Headache    History of kidney stones    Hyperlipidemia    Kidney stones    Lymphedema    Migraines    Osteoporosis    PONV (postoperative nausea and vomiting)    Right ureteral stone    Vitamin B12 deficiency    Vitamin D deficiency    Past Surgical History:  Procedure Laterality Date   AUGMENTATION MAMMAPLASTY     CESAREAN SECTION     x 4   COLONOSCOPY WITH PROPOFOL  N/A 03/31/2022   Procedure: COLONOSCOPY WITH PROPOFOL ;  Surgeon: Onita Elspeth Sharper, DO;  Location: [facility];  Service: Gastroenterology;  Laterality: N/A;   CYSTOSCOPY W/ RETROGRADES Bilateral 07/10/2020   Procedure: CYSTOSCOPY WITH RETROGRADE PYELOGRAM;  Surgeon: Francisca Redell BROCKS, MD;  Location: [facility];  Service: Urology;  Laterality: Bilateral;   CYSTOSCOPY/URETEROSCOPY/HOLMIUM LASER/STENT PLACEMENT     CYSTOSCOPY/URETEROSCOPY/HOLMIUM LASER/STENT PLACEMENT Bilateral 07/10/2020   Procedure: CYSTOSCOPY/URETEROSCOPY/HOLMIUM LASER/STENT PLACEMENT;  Surgeon: Francisca Redell BROCKS, MD;  Location: [facility];  Service:  Urology;  Laterality: Bilateral;   CYSTOSCOPY/URETEROSCOPY/HOLMIUM LASER/STENT PLACEMENT Right 08/25/2023   Procedure: CYSTOSCOPY/URETEROSCOPY/HOLMIUM LASER;  Surgeon: Francisca Redell BROCKS, MD;  Location: [facility];  Service: Urology;  Laterality: Right;   CYSTOSCOPY/URETEROSCOPY/HOLMIUM LASER/STENT PLACEMENT Right 12/15/2023   Procedure: CYSTOSCOPY/URETEROSCOPY/HOLMIUM LASER;  Surgeon: Francisca Redell BROCKS, MD;  Location: [facility];  Service: Urology;  Laterality: Right;   EXTRACORPOREAL SHOCK WAVE LITHOTRIPSY     x 10 plus   Eye Lift     FACIAL COSMETIC SURGERY     GANGLION CYST EXCISION Right 12/21/2021   Procedure: REMOVAL GANGLION OF WRIST;  Surgeon: Kathlynn Sharper, MD;  Location: [facility];  Service: Orthopedics;  Laterality: Right;   LIPOSUCTION     TONSILLECTOMY     TRIGGER FINGER RELEASE Right 12/21/2021   Procedure: RELEASE TRIGGER FINGER/A-1 PULLEY;  Surgeon: Kathlynn Sharper, MD;  Location: [facility];  Service: Orthopedics;  Laterality: Right;   URETEROSCOPY WITH HOLMIUM LASER LITHOTRIPSY     Patient Active Problem List   Diagnosis Date Noted   Acute CVA (cerebrovascular accident) (HCC) 12/19/2023   Hypokalemia 12/19/2023   AKI (acute kidney injury) 12/19/2023   Leukocytosis 12/19/2023   Hyperlipidemia, unspecified 12/19/2023   Essential hypertension 12/19/2023   Vision changes 12/19/2023   Lymphedema 05/22/2023   Chronic venous insufficiency 05/22/2023   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 replacement therapy 04/10/2023  Insomnia due to medical condition 04/25/2022   GAD (generalized anxiety disorder) 02/02/2022   Other specified depressive episodes 02/02/2022   Long-term current use of benzodiazepine 02/02/2022   Migraine with aura and without status migrainosus, not intractable 11/03/2020   Arrhythmia 08/06/2020   Status migrainosus 02/25/2019   Osteoporosis 04/09/2013   Vitamin D deficiency 04/09/2013   DDD (degenerative disc disease), lumbosacral 02/08/2005   ONSET DATE:  12/18/2023  REFERRING DIAG:   THERAPY DIAG:  Muscle weakness (generalized)  Other lack of coordination  Rationale for Evaluation and Treatment: Rehabilitation  SUBJECTIVE:  SUBJECTIVE STATEMENT:   Pt. reports doing well today.  Pt accompanied by: significant other  PERTINENT HISTORY: Pt. has Hx of stroke with onset 12/18/2023. Pt. PMHx includes: HTN, Hyperlipidemia, Kidney Stones, near syncopal event, loss of vision.  PRECAUTIONS: None  WEIGHT BEARING RESTRICTIONS: None  PAIN:  Are you having pain? No  FALLS: Has patient fallen in last 6 months? Yes. Number of falls    LIVING ENVIRONMENT: Lives with: lives with their family Lives in: [place], [place] Stairs: No, inside the house, yes but does not use Has following equipment at home:   PLOF: Independent  PATIENT GOALS: To be able to see  OBJECTIVE:   HAND DOMINANCE: Right  ADLs:  Eating: Drinking from straw is different, able to use utensils.  Grooming: Fatigues completing hair care.  UB Dressing: independent, if clothes are in front of her she can find clothing LB Dressing: Independent Toileting: Independent Bathing: Independent Tub Shower transfers: Independent Equipment: none Has difficulty with using cell phone  IADLs: Shopping: Does not typically go out shopping, and has not tried to. Light housekeeping: Pt. reports that she does not typically do house cleaning. Pt. has been able to unpack belongings from boxes due to her recent move in. Meal Prep: Pt. reports that is does not currently cook, however reports that she probably could. Has difficulty opening bottle caps/lids. Community mobility: Requires assistance from husband to navigate  through buildings, negotiate stairs-Pt. with increased fear of falling  Medication management: Able to push down medication bottles to open  Financial management: No change in the process-uses automatic bill pay system.  Has difficulty with using cell phone Handwriting:  N/T Work: Pt. was actively working managing a health visitor, works a lot of hours  MOBILITY STATUS: Independent and Needs Assist: Requires hand on hand assistance with nvigating through environments.   POSTURE COMMENTS:  No Significant postural limitations Sitting balance: Good  ACTIVITY TOLERANCE: Activity tolerance: Good  FUNCTIONAL OUTCOME MEASURES:   UPPER EXTREMITY ROM:    Active ROM Right Eval WFL Left Eval [facility]  Shoulder flexion    Shoulder abduction    Shoulder adduction    Shoulder extension    Shoulder internal rotation    Shoulder external rotation    Elbow flexion    Elbow extension    Wrist flexion    Wrist extension    Wrist ulnar deviation    Wrist radial deviation    Wrist pronation    Wrist supination    (Blank rows = not tested)  UPPER EXTREMITY MMT:     MMT Right eval Right 01/29/24 Right  04/04/24 Left eval Left 01/29/24 Left 04/04/24  Shoulder flexion 4-/5 5/5 5/5 4-/5 5/5 5/5  Shoulder abduction 4-/5 5/5 5/5 4-/5 5/5 5/5  Shoulder adduction        Shoulder extension        Shoulder internal rotation  Shoulder external rotation        Middle trapezius        Lower trapezius        Elbow flexion 5/5 5/5 5/5 5/5 5/5 5/5  Elbow extension 5/5 5/5 5/5 5/5 5/5 5/5  Wrist flexion 4/5 5/5 5/5 4-/5 5/5 5/5  Wrist extension 4/5 5/5 5/5 4-/5 5/5 5/5  Wrist ulnar deviation        Wrist radial deviation        Wrist pronation        Wrist supination        (Blank rows = not tested)  HAND FUNCTION: Grip strength: Right: 39 lbs; Left: 28 lbs, Lateral pinch: Right: 11 lbs, Left: 9 lbs, and 3 point pinch: Right: 9 lbs, Left: 9 lbs  01/29/24 Grip strength: Right: 39 lbs; Left: 36 lbs, Lateral pinch: Right: 14 lbs, Left: 12 lbs, and 3 point pinch: Right: 12 lbs, Left: 11 lbs  04/04/24:  Grip strength: Right: 37 lbs; Left: 42 lbs  05/14/24  Grip strength: Right: 55 lbs; Left: 47 lbs  COORDINATION:  9 Hole Peg test: Right: 30 sec;  Left: 36 sec  01/29/24 9 Hole Peg test: Right: 24 sec; Left: 23 sec.  04/04/24:  9 Hole Peg test: Right: 28 sec; Left: 33 sec.  05/14/24:  9 Hole Peg test: Right: 23 sec; Left: 22 sec.  SENSATION: WFL  EDEMA:   MUSCLE TONE:   COGNITION: Overall cognitive status: Within functional limits for tasks assessed  VISION: Subjective report: Pt. reports changes in her vision at onset of stroke, starting with blurry vision, resulting in vision loss in the R eye.  Baseline vision: Pt. Is able to visually track  Visual history: Hx of eye surgery  VISION ASSESSMENT: TBD  PERCEPTION: WFL  PRAXIS: WFL  OBSERVATIONS:                                                                                                                    TREATMENT DATE: 06/05/24    Therapeutic activities:   -Facilitated, and reviewed visual scanning skills for multiple items at a time while seated at the tabletop, multiple trials were completed. -Dual tasking was added to the task to increase the complexity of the task, and challenge divided attention during the task. -Facilitated visual memory skills working to recall the items completed during the visual memory task. -Focused on attention to detail reviewing her work for accuracy, and counting the number of items in each category.   PATIENT EDUCATION:  Compensatory strategies for visual perceptual functioning  HOME EXERCISE PROGRAM:  -Upgraded to blue theraputty for gross grip strengthening. -Green Theraputty exercises for  hand strengthening at bridge -Visual perceptual: Flip flop target design patterns. -Visual memory tasks -Writing tasks -visual motor tasks.  GOALS: Goals reviewed with patient? Yes  SHORT TERM GOALS: Target date: 06/25/2024   Pt. Will independently utilize HEP for hand strength, coordination, and visual compensatory strategies ADL/ADLs Baseline: 05/14/24: Independent 04/12/23: Independent, continue 03/06/24: Independent  Eval: No  current HEP  Goal status: Ongoing   LONG TERM GOALS: Target date: 08/06/2024  Pt. Will be able to independently implement visual scanning/visual search compensatory strategies for tasks within her extra personal space navigating through community environments 100% of the time.  Baseline: 01/29/24:  Independent  100% of the time within the therapy gym, and hallway. Eval: Pt. Requires increased assist to navigate through community environments. Goal status: Achieved  2.  Pt. Will be able to independently utilize visual scanning/visual search strategies  during ADLs/IADLs within her near space, and during tabletop tasks, and tasks at a vertical plane with 100% accuracy. Baseline: 05/14/24: 90% simple to moderately complex visual scanning tasks however 75% accuracy for complex visual scanning tasks, and scanning at a vertical plane. 04/11/24: 80% simple to moderately complex visual scanning tasks however 70% accuracy for complex visual scanning tasks, and scanning at a vertical plane.  03/06/24: Initiates visual scanning 85% of the time  01/29/24: 75% Eval: Pt. Education to be provided.  Goal status: Progressing Ongoing  3.  Pt. Will be able to independently initiate visual scanning techniques in her own environment to reduce risk of falls.  Baseline: 01/29/24: Education was provided, Pt. is utlizing visual compensatory strategies/visual scanning within her home. Eval: Pt. Education to be provided.  Goal status: Achieved  4.  Pt. Will increase BUE Grip Strength by 5# to be able to independently and securely hold objects for ADL/IADL use. Baseline: 05/14/24: Grip strength: Right: 55 lbs; Left: 47 lbs Pt. Has improved with holding items within her hand without dropping them.04/11/24: Continue- recent revision 04/04/2024: Grip strength: Right: 37 lbs; Left: 42 lbs patient is dropping items from her hands 01/29/24: Grip strength: Right: 39 lbs; Left: 28 lbs, Eval: Right Grip: 39#, Left Grip: 28# Goal  status: Achieved  5.  Pt. Will increase L Lateral Pinch strength by 2# to be able to open bottles. Baseline: 05/14/24: Pt. is able to open previously opened jars/bottles. Has difficulty with unopened jars/bottles.04/04/2024: Grip strength: Right: 37 lbs; Left: 42 lbs 01/29/24: Lateral pinch: Right: 14 lbs, Left: 12 lbs Eval: Lateral pinch: Right: 11 lbs, Left: 9 lbs  Goal status: Deferred  6.  Pt. Will increase BUE strength for shoulder flexion/abduction by 2 mm grades to independently complete hair care tasks. Baseline:  03/16/2024: 5/5 01/29/24: 5/5 overall Eval: Right Shoulder Flexion: 4-/5, Left Shoulder Flexion:4-/5, Right Shoulder Abduction: 4-/5, Left Shoulder Abduction: 4-/5 Goal status: Achieved  7.  Pt will be able to independently and efficiently manipulate medication without dropping them.  Baseline: 05/14/24: 9 Hole Peg test: Right: 23 sec; Left: 22 sec. 04/11/24: Continue, recent revision 04/04/2024: 9 Hole Peg test: Right: 28 sec; Left: 33 sec. 01/29/24: Independent 01/15/24: Daughter currently manages medication set up.  Goal status: Ongoing  8. Pt will increase typing speed to 20-30 words per minute with at least 90% accuracy to work towards more efficient typing for job related  responsibilities.  Baseline: 05/14/24: Pt. Continues to present with multiple mistypes, and requires increased time.05/14/24: 04/11/24: TBD 03/06/24: 10 wpm with 92% accuracy. 01/29/24: Continue 01/15/24: 8wpm x88% accuracy=7 wpm  Goal status: Ongoing  9. Pt will be able to scan small print on store receipts to check for errors with 100% accuracy using visual compensation strategies as needed.  Baseline: 05/14/24: Pt. Is improving with efficiency of visually scanning receipts.04/11/24: Pt. Continues to present with difficulty with visual scanning small  small items efficiently. 03/06/24: Pt. Continues to present with difficulty with visual scanning  small  small items efficiently. 01/29/24: Pt. Continues to have  difficulty scanning small print items. 01/15/24: Not yet attempted; required for return to work/job responsibility  Goal status:  Ongoing  10. Pt. will independently identify 8 items consistently from visual visual memory in preparation or ADLs.    Baseline: 05/14/24: 5-7 items.04/11/24: 5-7 items  while seated at the tabletop. 03/06/24: Pt. is able to identify up to 6 picture items while seated at the tabletop. Pt. is able to consistently Identify 4 letters from visual memory when attention is divided.  01/29/24: Pt. is able to identify 6 items consistently from visual memory.    Gaol status: Ongoing      11. Pt. will be independently write 4 sentences  efficiently with 100% legibility, no deviation from writing on a blank line, and appropriate spacing between letters.              Baseline: 05/14/24: 4 lines with 80% legibility in printed form, completed in 2 min. & 16 sec. 04/11/24: Continue 03/06/24: 4 lines with 75% legibility, positive deviation below the line, and excessive spacing between the words.              Goal status: Ongoing  ASSESSMENT:  CLINICAL IMPRESSION:  Pt. was able to visually scan for 3 items at once on a standard 8.5x11 page. Pt. worked on assigning a shape each of the 3 objects. Pt. was able to identify all items with no omissions, however had multiple misidentifications of the shapes assigned to the objects in each of the trials. Pt. required increased time for recalling the items from visual memory. Pt. required increased time to complete the task. Pt. required cues for accuracy with counting the number of items completed during the visual scanning task. Pt. continues to benefit from Occupational Therapy services to improve her ability to use visual compensatory strategies, and improve overall BUE functioning in order to improve engagement in, and maximize overall independence with ADL, and IADL tasks.        PERFORMANCE DEFICITS: in functional skills including ADLs,  IADLs, coordination, dexterity, Fine motor control, and vision, and psychosocial skills including coping strategies, environmental adaptation, habits, interpersonal interactions, and routines and behaviors.   IMPAIRMENTS: are limiting patient from ADLs, IADLs, rest and sleep, work, leisure, and social participation.   CO-MORBIDITIES: may have co-morbidities  that affects occupational performance. Patient will benefit from skilled OT to address above impairments and improve overall function.  MODIFICATION OR ASSISTANCE TO COMPLETE EVALUATION: Min-Moderate modification of tasks or assist with assess necessary to complete an evaluation.  OT OCCUPATIONAL PROFILE AND HISTORY: Detailed assessment: Review of records and additional review of physical, cognitive, psychosocial history related to current functional performance.  CLINICAL DECISION MAKING: Moderate - several treatment options, min-mod task modification necessary  REHAB POTENTIAL: Good  EVALUATION COMPLEXITY: Moderate    PLAN:  OT FREQUENCY: 2x/week  OT DURATION: 12 weeks  PLANNED INTERVENTIONS: 97168 OT Re-evaluation, 97535 self care/ADL training, 02889 therapeutic exercise, 97530 therapeutic activity, 97112 neuromuscular re-education, visual/perceptual remediation/compensation, patient/family education, and DME and/or AE instructions  RECOMMENDED OTHER SERVICES: ST  CONSULTED AND AGREED WITH PLAN OF CARE: Patient and family member/caregiver  PLAN FOR NEXT SESSION: Treatment  Richardson Otter, MS, OTR/L  06/05/24, 2:18 PM

## 2024-06-05 NOTE — Therapy (Signed)
 OUTPATIENT SPEECH LANGUAGE PATHOLOGY  TREATMENT   Patient Name: Ashley Collins MRN: [id number] DOB:1961-01-21, 63 y.o., female Today's Date: 06/05/2024  PCP: Gaetana Haddock, NP  REFERRING PROVIDER: same     End of Session - 06/05/24 1340     Visit Number 29    Number of Visits 47    Date for Recertification  08/06/24    SLP Start Time 1445    SLP Stop Time  1530    SLP Time Calculation (min) 45 min    Activity Tolerance Patient tolerated treatment well           Patient Active Problem List   Diagnosis Date Noted   Acute CVA (cerebrovascular accident) (HCC) 12/19/2023   Hypokalemia 12/19/2023   AKI (acute kidney injury) 12/19/2023   Leukocytosis 12/19/2023   Hyperlipidemia, unspecified 12/19/2023   Essential hypertension 12/19/2023   Vision changes 12/19/2023   Lymphedema 05/22/2023   Chronic venous insufficiency 05/22/2023   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 replacement therapy 04/10/2023   Insomnia due to medical condition 04/25/2022   GAD (generalized anxiety disorder) 02/02/2022   Other specified depressive episodes 02/02/2022   Long-term current use of benzodiazepine 02/02/2022   Migraine with aura and without status migrainosus, not intractable 11/03/2020   Arrhythmia 08/06/2020   Status migrainosus 02/25/2019   Osteoporosis 04/09/2013   Vitamin D deficiency 04/09/2013   DDD (degenerative disc disease), lumbosacral 02/08/2005    ONSET DATE: 12/19/23   REFERRING DIAG: CVA- memory deficits  THERAPY DIAG:  Cognitive communication deficit  Rationale for Evaluation and Treatment Rehabilitation  SUBJECTIVE:   SUBJECTIVE STATEMENT: Pt alert, pleasant, and cooperative. Pt accompanied by: self and significant other  PERTINENT HISTORY & DIAGNOSTIC FINDINGS: Pt is 64 y.o. female who presents today for a cognitive-communication evaluation in setting of stroke. MRI 12/18/23 1. 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 No  associated hemorrhage or mass effect. PMHx as outlined above.  PAIN:  Are you having pain? No   FALLS: Has patient fallen in last 6 months?  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 Pt was able to summarize key points from chapter in biography she is reading with use of notes. Pt stated she is continuing to read aloud, summarize intermittently, write notes and review notes prior to resuming reading. Pt noted needing to summarize after each paragraph vs after whole page. Pt now using index card for scanning. Pt reports improved reading stamina and retention with reading moved to earlier in the day. Discussed compensation of moving more difficult tasks to best time of day and how to do that at work. Will continue to target.  Pt completed return to work questionnaire and identified potential concerns for returning to work. Pt concerned about fatigue and memory. Pt planning to request additional rest breaks as well as the permission to carry water  bottle. Pt planning to ask for shortened workday to help prevent fatigue/overstimulation.     PATIENT EDUCATION: Education details: as above Person educated: Patient and Spouse Education method: Explanation Education comprehension: verbalized understanding  HOME EXERCISE PROGRAM:        Continue compensations for attention and memory (with emphasis on reading)  GOALS:  Goals reviewed with patient? Yes  SHORT TERM GOALS: Target date: 10 sessions  Pt will complete PROM re: memory.  Baseline: Goal status: MET   2.  Pt will endorse successful implementation of at least x2  compensations for attention and memory.  Baseline:  Goal status: MET  3.  With Moderate A, patient will establish external aid for memory/executive function and bring to more than 50% of therapy sessions.    Baseline:  Goal status: MET  New goals - established 05/14/2024 4. Pt will utilize compensation for attention/recall (e.g. reading aloud, notetaking) to recall and summarize details in a functional task (e.g. therapy session, reading, tv program).  Baseline:  Goal status: INITIAL   5. Pt will demonstrate alternating attention over 10 minutes between 2 mod complex cognitive-linguistic tasks >80% accuracy with modified Independence. Baseline: Goal status: INITIAL    6. Pt will identify x3 compensations for improved cognitive-communication during functional activities at home/work.   Baseline:   Goal status: INITIAL    LONG TERM GOALS: Target date: 12 weeks  Pt will endorse improvement in cognitive-communication per PROM.  Baseline:  Goal status: MET  2.  Pt and/or husband will demonstrate understanding of ways to promote and support cognitive-communication outside of SLP sessions.  Baseline:  Goal status: MET  New goal - established 05/14/24 3. Patient will report engagement in cognitive activities outside of ST 4/7 days.   Baseline:   Goal status: INITIAL  ASSESSMENT:  CLINICAL IMPRESSION:  Pt is 63 y.o. female who presents today for a cognitive-communication treatment in setting of stroke. Initial assessment completed via formal means (Cognitive-Linguistic Quick Test) and PROM (Neuro-QoL Adult Cognitive Function v2.0). Pt presents with at least mild cognitive-communication deficits affecting attention and visuospatial skills per CLQT as well as memory and executive functioning per PROM. Suspect CLQT is not as sensitive to higher level cognitive-communication deficits appreciated by pt during iADLs. Pt continues to make excellent progress; however, pt continues with  impaired working memory/divided attention and short term recall. Pt planning to transition back to work in in January 2026 and pt would benefit from further compensation training and cognitive retraining to promote a successful transition back to work. Please see details of today's tx as outlined above. Recommend skilled ST services targeting above mentioned deficits to improve QoL and performance on ADLs/IADLs.  OBJECTIVE IMPAIRMENTS include attention, memory, executive functioning, and visuospatial deficits. These impairments are limiting patient from return to work, managing appointments, household responsibilities, and ADLs/IADLs. Factors affecting potential to achieve goals and functional outcome are medical prognosis.. Patient will benefit from skilled SLP services to address above impairments and improve overall function.  REHAB POTENTIAL: Good  PLAN: SLP FREQUENCY: 1-2x/week  SLP DURATION: 12 weeks  PLANNED INTERVENTIONS: Cueing hierachy, Cognitive reorganization, Internal/external aids, Functional tasks, SLP instruction and feedback, Compensatory strategies, and Patient/family education    Delon Bangs, M.S., CCC-SLP Speech-Language Pathologist [place] [facility] [phone] [place])  [facility] [address] [place], [place], [address] Phone: [phone]   Fax:  [phone]

## 2024-06-06 ENCOUNTER — Ambulatory Visit: Admitting: Physical Therapy

## 2024-06-06 ENCOUNTER — Ambulatory Visit: Admitting: Occupational Therapy

## 2024-06-06 ENCOUNTER — Ambulatory Visit

## 2024-06-11 ENCOUNTER — Ambulatory Visit

## 2024-06-11 ENCOUNTER — Ambulatory Visit: Admitting: Occupational Therapy

## 2024-06-11 ENCOUNTER — Ambulatory Visit: Admitting: Physical Therapy

## 2024-06-11 DIAGNOSIS — M6281 Muscle weakness (generalized): Secondary | ICD-10-CM

## 2024-06-11 DIAGNOSIS — R41841 Cognitive communication deficit: Secondary | ICD-10-CM | POA: Diagnosis not present

## 2024-06-11 DIAGNOSIS — R278 Other lack of coordination: Secondary | ICD-10-CM

## 2024-06-11 DIAGNOSIS — M546 Pain in thoracic spine: Secondary | ICD-10-CM

## 2024-06-11 DIAGNOSIS — M5459 Other low back pain: Secondary | ICD-10-CM

## 2024-06-11 DIAGNOSIS — H543 Unqualified visual loss, both eyes: Secondary | ICD-10-CM

## 2024-06-11 NOTE — Therapy (Signed)
 Occupational Therapy Neuro Treatment Note   Patient Name: Ashley Collins MRN: [id number] DOB:[birth year], 63 y.o., female Today's Date: 06/11/2024  PCP: Harvey Gaetana CROME, NP REFERRING PROVIDER: Harvey Gaetana CROME, NP   OT End of Session - 06/11/24 1632     Visit Number 48    Number of Visits 72    Date for Recertification  08/06/24    OT Start Time 1620    OT Stop Time 1700    OT Time Calculation (min) 40 min    Activity Tolerance Patient tolerated treatment well    Behavior During Therapy WFL for tasks assessed/performed               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   Cardiac arrhythmia    Nonspecific ST T wave changes on EKG   Chronic venous insufficiency of lower extremity    Complication of anesthesia    nausea and vomiting   DDD (degenerative disc disease), lumbosacral    Essential hypertension    Headache    History of kidney stones    Hyperlipidemia    Kidney stones    Lymphedema    Migraines    Osteoporosis    PONV (postoperative nausea and vomiting)    Right ureteral stone    Vitamin B12 deficiency    Vitamin D deficiency    Past Surgical History:  Procedure Laterality Date   AUGMENTATION MAMMAPLASTY     CESAREAN SECTION     x 4   COLONOSCOPY WITH PROPOFOL  N/A 03/31/2022   Procedure: COLONOSCOPY WITH PROPOFOL ;  Surgeon: Onita Elspeth Sharper, DO;  Location: [facility] ENDOSCOPY;  Service: Gastroenterology;  Laterality: N/A;   CYSTOSCOPY W/ RETROGRADES Bilateral 07/10/2020   Procedure: CYSTOSCOPY WITH RETROGRADE PYELOGRAM;  Surgeon: Francisca Redell BROCKS, MD;  Location: [facility] ORS;  Service: Urology;  Laterality: Bilateral;   CYSTOSCOPY/URETEROSCOPY/HOLMIUM LASER/STENT PLACEMENT     CYSTOSCOPY/URETEROSCOPY/HOLMIUM LASER/STENT PLACEMENT Bilateral 07/10/2020   Procedure: CYSTOSCOPY/URETEROSCOPY/HOLMIUM LASER/STENT PLACEMENT;  Surgeon: Francisca Redell BROCKS, MD;  Location: [facility] ORS;  Service:  Urology;  Laterality: Bilateral;   CYSTOSCOPY/URETEROSCOPY/HOLMIUM LASER/STENT PLACEMENT Right 08/25/2023   Procedure: CYSTOSCOPY/URETEROSCOPY/HOLMIUM LASER;  Surgeon: Francisca Redell BROCKS, MD;  Location: [facility] ORS;  Service: Urology;  Laterality: Right;   CYSTOSCOPY/URETEROSCOPY/HOLMIUM LASER/STENT PLACEMENT Right 12/15/2023   Procedure: CYSTOSCOPY/URETEROSCOPY/HOLMIUM LASER;  Surgeon: Francisca Redell BROCKS, MD;  Location: [facility] ORS;  Service: Urology;  Laterality: Right;   EXTRACORPOREAL SHOCK WAVE LITHOTRIPSY     x 10 plus   Eye Lift     FACIAL COSMETIC SURGERY     GANGLION CYST EXCISION Right 12/21/2021   Procedure: REMOVAL GANGLION OF WRIST;  Surgeon: Kathlynn Sharper, MD;  Location: [facility] ORS;  Service: Orthopedics;  Laterality: Right;   LIPOSUCTION     TONSILLECTOMY     TRIGGER FINGER RELEASE Right 12/21/2021   Procedure: RELEASE TRIGGER FINGER/A-1 PULLEY;  Surgeon: Kathlynn Sharper, MD;  Location: [facility] ORS;  Service: Orthopedics;  Laterality: Right;   URETEROSCOPY WITH HOLMIUM LASER LITHOTRIPSY     Patient Active Problem List   Diagnosis Date Noted   Acute CVA (cerebrovascular accident) (HCC) 12/19/2023   Hypokalemia 12/19/2023   AKI (acute kidney injury) 12/19/2023   Leukocytosis 12/19/2023   Hyperlipidemia, unspecified 12/19/2023   Essential hypertension 12/19/2023   Vision changes 12/19/2023   Lymphedema 05/22/2023   Chronic venous insufficiency 05/22/2023   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 replacement therapy 04/10/2023  Insomnia due to medical condition 04/25/2022   GAD (generalized anxiety disorder) 02/02/2022   Other specified depressive episodes 02/02/2022   Long-term current use of benzodiazepine 02/02/2022   Migraine with aura and without status migrainosus, not intractable 11/03/2020   Arrhythmia 08/06/2020   Status migrainosus 02/25/2019   Osteoporosis 04/09/2013   Vitamin D deficiency 04/09/2013   DDD (degenerative disc disease), lumbosacral 02/08/2005   ONSET DATE:  12/18/2023  REFERRING DIAG:   THERAPY DIAG:  Muscle weakness (generalized)  Other lack of coordination  Low vision, both eyes  Rationale for Evaluation and Treatment: Rehabilitation  SUBJECTIVE:  SUBJECTIVE STATEMENT:   Pt. reports doing well today.  Pt accompanied by: significant other  PERTINENT HISTORY: Pt. has Hx of stroke with onset 12/18/2023. Pt. PMHx includes: HTN, Hyperlipidemia, Kidney Stones, near syncopal event, loss of vision.  PRECAUTIONS: None  WEIGHT BEARING RESTRICTIONS: None  PAIN:  Are you having pain? No  FALLS: Has patient fallen in last 6 months? Yes. Number of falls    LIVING ENVIRONMENT: Lives with: lives with their family Lives in: [place], [place] Stairs: No, inside the house, yes but does not use Has following equipment at home:   PLOF: Independent  PATIENT GOALS: To be able to see  OBJECTIVE:   HAND DOMINANCE: Right  ADLs:  Eating: Drinking from straw is different, able to use utensils.  Grooming: Fatigues completing hair care.  UB Dressing: independent, if clothes are in front of her she can find clothing LB Dressing: Independent Toileting: Independent Bathing: Independent Tub Shower transfers: Independent Equipment: none Has difficulty with using cell phone  IADLs: Shopping: Does not typically go out shopping, and has not tried to. Light housekeeping: Pt. reports that she does not typically do house cleaning. Pt. has been able to unpack belongings from boxes due to her recent move in. Meal Prep: Pt. reports that is does not currently cook, however reports that she probably could. Has difficulty opening bottle caps/lids. Community mobility: Requires assistance from husband to navigate  through buildings, negotiate stairs-Pt. with increased fear of falling  Medication management: Able to push down medication bottles to open  Financial management: No change in the process-uses automatic bill pay system.  Has difficulty with using  cell phone Handwriting: N/T Work: Pt. was actively working managing a health visitor, works a lot of hours  MOBILITY STATUS: Independent and Needs Assist: Requires hand on hand assistance with nvigating through environments.   POSTURE COMMENTS:  No Significant postural limitations Sitting balance: Good  ACTIVITY TOLERANCE: Activity tolerance: Good  FUNCTIONAL OUTCOME MEASURES:   UPPER EXTREMITY ROM:    Active ROM Right Eval WFL Left Eval [facility]  Shoulder flexion    Shoulder abduction    Shoulder adduction    Shoulder extension    Shoulder internal rotation    Shoulder external rotation    Elbow flexion    Elbow extension    Wrist flexion    Wrist extension    Wrist ulnar deviation    Wrist radial deviation    Wrist pronation    Wrist supination    (Blank rows = not tested)  UPPER EXTREMITY MMT:     MMT Right eval Right 01/29/24 Right  04/04/24 Left eval Left 01/29/24 Left 04/04/24  Shoulder flexion 4-/5 5/5 5/5 4-/5 5/5 5/5  Shoulder abduction 4-/5 5/5 5/5 4-/5 5/5 5/5  Shoulder adduction        Shoulder extension        Shoulder internal rotation  Shoulder external rotation        Middle trapezius        Lower trapezius        Elbow flexion 5/5 5/5 5/5 5/5 5/5 5/5  Elbow extension 5/5 5/5 5/5 5/5 5/5 5/5  Wrist flexion 4/5 5/5 5/5 4-/5 5/5 5/5  Wrist extension 4/5 5/5 5/5 4-/5 5/5 5/5  Wrist ulnar deviation        Wrist radial deviation        Wrist pronation        Wrist supination        (Blank rows = not tested)  HAND FUNCTION: Grip strength: Right: 39 lbs; Left: 28 lbs, Lateral pinch: Right: 11 lbs, Left: 9 lbs, and 3 point pinch: Right: 9 lbs, Left: 9 lbs  01/29/24 Grip strength: Right: 39 lbs; Left: 36 lbs, Lateral pinch: Right: 14 lbs, Left: 12 lbs, and 3 point pinch: Right: 12 lbs, Left: 11 lbs  04/04/24:  Grip strength: Right: 37 lbs; Left: 42 lbs  05/14/24  Grip strength: Right: 55 lbs; Left: 47 lbs  COORDINATION:  9 Hole  Peg test: Right: 30 sec; Left: 36 sec  01/29/24 9 Hole Peg test: Right: 24 sec; Left: 23 sec.  04/04/24:  9 Hole Peg test: Right: 28 sec; Left: 33 sec.  05/14/24:  9 Hole Peg test: Right: 23 sec; Left: 22 sec.  SENSATION: WFL  EDEMA:   MUSCLE TONE:   COGNITION: Overall cognitive status: Within functional limits for tasks assessed  VISION: Subjective report: Pt. reports changes in her vision at onset of stroke, starting with blurry vision, resulting in vision loss in the R eye.  Baseline vision: Pt. 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 -Pt. worked on counting, flipping, and sorting multiple receipts at the tabletop surface with a speed component added to the task.  -Pt. 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    PATIENT EDUCATION:  Compensatory strategies for visual perceptual functioning  HOME EXERCISE PROGRAM:  -Upgraded to blue theraputty for gross grip strengthening. -Green Theraputty exercises for  hand strengthening at bridge -Visual perceptual: Flip flop target design patterns. -Visual memory tasks -Writing tasks -visual motor tasks.  GOALS: Goals reviewed with patient? Yes  SHORT TERM GOALS: Target date: 06/25/2024   Pt. Will independently utilize HEP for hand strength, coordination, and visual compensatory strategies ADL/ADLs Baseline: 05/14/24: Independent 04/12/23: Independent, continue 03/06/24: Independent  Eval: No  current HEP  Goal status: Ongoing   LONG TERM GOALS: Target date: 08/06/2024  Pt. Will be able to independently implement visual scanning/visual search compensatory strategies for tasks within her extra personal space navigating through community environments 100% of the time.  Baseline: 01/29/24:  Independent  100% of the time within the therapy gym, and hallway. Eval: Pt. Requires increased assist to navigate through community environments. Goal status: Achieved  2.  Pt. Will be able to independently utilize visual scanning/visual search strategies  during ADLs/IADLs within her near space, and during tabletop tasks, and tasks at a vertical plane with 100% accuracy. Baseline: 05/14/24: 90% simple to moderately complex visual scanning tasks however 75% accuracy for complex visual scanning tasks, and scanning at a vertical plane. 04/11/24: 80% simple to moderately complex visual scanning tasks however 70% accuracy for complex visual scanning tasks, and scanning at a vertical plane.  03/06/24: Initiates visual scanning 85% of the time  01/29/24: 75% Eval: Pt. Education to be provided.  Goal status: Progressing Ongoing  3.  Pt. Will be able to independently initiate visual scanning techniques in her own environment to reduce risk of falls.  Baseline: 01/29/24: Education was provided, Pt. is utlizing visual compensatory strategies/visual scanning within her home. Eval: Pt. Education to be provided.  Goal status: Achieved  4.  Pt. Will increase BUE Grip Strength by 5# to be able to independently and securely hold objects for ADL/IADL use. Baseline: 05/14/24: Grip strength: Right: 55 lbs; Left: 47 lbs Pt. Has improved with holding items within her hand without dropping them.04/11/24: Continue- recent revision 04/04/2024: Grip strength: Right: 37 lbs; Left: 42 lbs patient is dropping items from her hands 01/29/24: Grip strength: Right: 39 lbs; Left: 28 lbs, Eval: Right Grip: 39#, Left Grip: 28# Goal  status: Achieved  5.  Pt. Will increase L Lateral Pinch strength by 2# to be able to open bottles. Baseline: 05/14/24: Pt. is able to open previously opened jars/bottles. Has difficulty with unopened jars/bottles.04/04/2024: Grip strength: Right: 37 lbs; Left: 42 lbs 01/29/24: Lateral pinch: Right: 14 lbs, Left: 12 lbs Eval: Lateral pinch: Right: 11 lbs, Left: 9 lbs  Goal status: Deferred  6.  Pt. Will increase BUE strength for shoulder flexion/abduction by 2 mm grades to independently complete hair care tasks. Baseline:  03/16/2024: 5/5 01/29/24: 5/5 overall Eval: Right Shoulder Flexion: 4-/5, Left Shoulder Flexion:4-/5, Right Shoulder Abduction: 4-/5, Left Shoulder Abduction: 4-/5 Goal status: Achieved  7.  Pt will be able to independently and efficiently manipulate medication without dropping them.  Baseline: 05/14/24: 9 Hole Peg test: Right: 23 sec; Left: 22 sec. 04/11/24: Continue, recent revision 04/04/2024: 9 Hole Peg test: Right: 28 sec; Left: 33 sec. 01/29/24: Independent 01/15/24: Daughter currently manages medication set up.  Goal status: Ongoing  8. Pt will increase typing speed to 20-30 words per minute with at least 90% accuracy to work towards more efficient typing for job related  responsibilities.  Baseline: 05/14/24: Pt. Continues to present with multiple mistypes, and requires increased time.05/14/24: 04/11/24: TBD 03/06/24: 10 wpm with 92% accuracy. 01/29/24: Continue 01/15/24: 8wpm x88% accuracy=7 wpm  Goal status: Ongoing  9. Pt will be able to scan small print on store receipts to check for errors with 100% accuracy using visual compensation strategies as needed.  Baseline: 05/14/24: Pt. Is improving with efficiency of visually scanning receipts.04/11/24: Pt. Continues to present with difficulty with visual scanning small  small items efficiently. 03/06/24: Pt. Continues to present with difficulty with visual scanning small  small items efficiently. 01/29/24:  Pt. Continues to have  difficulty scanning small print items. 01/15/24: Not yet attempted; required for return to work/job responsibility  Goal status:  Ongoing  10. Pt. will independently identify 8 items consistently from visual visual memory in preparation or ADLs.    Baseline: 05/14/24: 5-7 items.04/11/24: 5-7 items  while seated at the tabletop. 03/06/24: Pt. is able to identify up to 6 picture items while seated at the tabletop. Pt. is able to consistently Identify 4 letters from visual memory when attention is divided.  01/29/24: Pt. is able to identify 6 items consistently from visual memory.    Gaol status: Ongoing      11. Pt. will be independently write 4 sentences  efficiently with 100% legibility, no deviation from writing on a blank line, and appropriate spacing between letters.              Baseline: 05/14/24: 4 lines with 80% legibility in printed form, completed in 2 min. & 16 sec. 04/11/24: Continue 03/06/24: 4 lines with 75% legibility, positive deviation below the line, and excessive spacing between the words.              Goal status: Ongoing  ASSESSMENT:  CLINICAL IMPRESSION:  Pt. was able to visually scan each of the receipts efficiently, and calculate amounts with 100% accuracy following directions adding amounts ffor 90% of the them, and subtracting them 10% of them. Pt. was able to accurately complete speed reps of identifying components of receipts.Pt. was able to efficiently sort through receipts when challenged with a speed component. Pt. continues to benefit from Occupational Therapy services to improve her ability to use visual compensatory strategies, and improve overall BUE functioning in order to improve engagement in, and maximize overall independence with ADL, and IADL tasks.        PERFORMANCE DEFICITS: in functional skills including ADLs, IADLs, coordination, dexterity, Fine motor control, and vision, and psychosocial skills including coping strategies, environmental adaptation, habits,  interpersonal interactions, and routines and behaviors.   IMPAIRMENTS: are limiting patient from ADLs, IADLs, rest and sleep, work, leisure, and social participation.   CO-MORBIDITIES: may have co-morbidities  that affects occupational performance. Patient will benefit from skilled OT to address above impairments and improve overall function.  MODIFICATION OR ASSISTANCE TO COMPLETE EVALUATION: Min-Moderate modification of tasks or assist with assess necessary to complete an evaluation.  OT OCCUPATIONAL PROFILE AND HISTORY: Detailed assessment: Review of records and additional review of physical, cognitive, psychosocial history related to current functional performance.  CLINICAL DECISION MAKING: Moderate - several treatment options, min-mod task modification necessary  REHAB POTENTIAL: Good  EVALUATION COMPLEXITY: Moderate    PLAN:  OT FREQUENCY: 2x/week  OT DURATION: 12 weeks  PLANNED INTERVENTIONS: 97168 OT Re-evaluation, 97535 self care/ADL training, 02889 therapeutic exercise, 97530 therapeutic activity, 97112 neuromuscular re-education, visual/perceptual remediation/compensation, patient/family education, and DME and/or AE instructions  RECOMMENDED OTHER SERVICES: ST  CONSULTED AND AGREED WITH PLAN OF CARE: Patient and family member/caregiver  PLAN FOR NEXT SESSION: Treatment  Richardson Otter, MS, OTR/L  06/11/2024, 4:36 PM

## 2024-06-11 NOTE — Therapy (Signed)
 OUTPATIENT PHYSICAL THERAPY THORACOLUMBAR TREATMENT   Patient Name: Ashley Collins MRN: [id number] DOB:1961-01-21, 63 y.o., female Today's Date: 06/11/2024  END OF SESSION:  PT End of Session - 06/11/24 1443     Visit Number 8    Number of Visits 16    Date for Recertification  06/20/24    Progress Note Due on Visit 10    PT Start Time 1445    PT Stop Time 1527    PT Time Calculation (min) 42 min    Equipment Utilized During Treatment Gait belt    Activity Tolerance Patient tolerated treatment well    Behavior During Therapy WFL for tasks assessed/performed              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   Cardiac arrhythmia    Nonspecific ST T wave changes on EKG   Chronic venous insufficiency of lower extremity    Complication of anesthesia    nausea and vomiting   DDD (degenerative disc disease), lumbosacral    Essential hypertension    Headache    History of kidney stones    Hyperlipidemia    Kidney stones    Lymphedema    Migraines    Osteoporosis    PONV (postoperative nausea and vomiting)    Right ureteral stone    Vitamin B12 deficiency    Vitamin D deficiency    Past Surgical History:  Procedure Laterality Date   AUGMENTATION MAMMAPLASTY     CESAREAN SECTION     x 4   COLONOSCOPY WITH PROPOFOL  N/A 03/31/2022   Procedure: COLONOSCOPY WITH PROPOFOL ;  Surgeon: Onita Elspeth Sharper, DO;  Location: [facility] ENDOSCOPY;  Service: Gastroenterology;  Laterality: N/A;   CYSTOSCOPY W/ RETROGRADES Bilateral 07/10/2020   Procedure: CYSTOSCOPY WITH RETROGRADE PYELOGRAM;  Surgeon: Francisca Redell BROCKS, MD;  Location: [facility] ORS;  Service: Urology;  Laterality: Bilateral;   CYSTOSCOPY/URETEROSCOPY/HOLMIUM LASER/STENT PLACEMENT     CYSTOSCOPY/URETEROSCOPY/HOLMIUM LASER/STENT PLACEMENT Bilateral 07/10/2020   Procedure: CYSTOSCOPY/URETEROSCOPY/HOLMIUM LASER/STENT PLACEMENT;  Surgeon: Francisca Redell BROCKS,  MD;  Location: [facility] ORS;  Service: Urology;  Laterality: Bilateral;   CYSTOSCOPY/URETEROSCOPY/HOLMIUM LASER/STENT PLACEMENT Right 08/25/2023   Procedure: CYSTOSCOPY/URETEROSCOPY/HOLMIUM LASER;  Surgeon: Francisca Redell BROCKS, MD;  Location: [facility] OR[facility];  Service: Urology;  Laterality: Right;   CYSTOSCOPY/URETEROSCOPY/HOLMIUM LASER/STENT PLACEMENT Right 12/15/2023   Procedure: CYSTOSCOPY/URETEROSCOPY/HOLMIUM LASER;  Surgeon: Francisca Redell BROCKS, MD;  Location: [facility] OR[facility];  Service: Urology;  Laterality: Right;   EXTRACORPOREAL SHOCK WAVE LITHOTRIPSY     x 10 plus   Eye Lift     FACIAL COSMETIC SURGERY     GANGLION CYST EXCISION Right 12/21/2021   Procedure: REMOVAL GANGLION OF WRIST;  Surgeon: Kathlynn Sharper, MD;  Location: [facility] ORS;  Service: Orthopedics;  Laterality: Right;   LIPOSUCTION     TONSILLECTOMY     TRIGGER FINGER RELEASE Right 12/21/2021   Procedure: RELEASE TRIGGER FINGER/A-1 PULLEY;  Surgeon: Kathlynn Sharper, MD;  Location: [facility] ORS;  Service: Orthopedics;  Laterality: Right;   URETEROSCOPY WITH HOLMIUM LASER LITHOTRIPSY     Patient Active Problem List   Diagnosis Date Noted   Acute CVA (cerebrovascular accident) (HCC) 12/19/2023   Hypokalemia 12/19/2023   AKI (acute kidney injury) 12/19/2023   Leukocytosis 12/19/2023   Hyperlipidemia, unspecified 12/19/2023   Essential hypertension 12/19/2023   Vision changes 12/19/2023   Lymphedema 05/22/2023   Chronic venous insufficiency 05/22/2023   Menopausal  syndrome on hormone replacement therapy 04/10/2023   Insomnia due to medical condition 04/25/2022   GAD (generalized anxiety disorder) 02/02/2022   Other specified depressive episodes 02/02/2022   Long-term current use of benzodiazepine 02/02/2022   Migraine with aura and without status migrainosus, not intractable 11/03/2020   Arrhythmia 08/06/2020   Status migrainosus 02/25/2019   Osteoporosis 04/09/2013   Vitamin D deficiency 04/09/2013   DDD (degenerative disc disease), lumbosacral  02/08/2005    PCP: Harvey Gaetana CROME, NP   REFERRING PROVIDER: Meeler, Benton CROME, FNP   REFERRING DIAG: M54.16 (ICD-10-CM) - Lumbar radiculitis   Rationale for Evaluation and Treatment: Rehabilitation  THERAPY DIAG:  Muscle weakness (generalized)  Pain in thoracic spine  Other low back pain  ONSET DATE: 1.5 months   SUBJECTIVE:                                                                                                                                                                                           SUBJECTIVE STATEMENT:  Pt reports her back hurt yesterday after sitting in her bed working on bills. Instructed that she not be in this position for prolonged period particular with her history of T spine pain.   From Eval: Pt here to work on pain and discomfort in her mid back. Pt reports the more she is on her feet the more her back hurts and relief comes from being supine. Pt has not had any previous therapy for her back pain. Pt has epidural that assists with her pain below her belt line. Pt still in OT working on her upper extremity and grip strength. Pt has epidural shots for last 3 years. Pt initially hurt her back lifting something to put in her car. Patient can stand for maybe 30 min at a time before pain is no longer tolerable. She reports that sitting down does not improve her pain but lying on her side in would bring relief. Pt has history of osteoporosis and sliped disc in her back. Pt pain in this area is similar to pain she gets epidural for but just further superior into her thoracic spine.    PERTINENT HISTORY:  Patient familiar to this clinic before.  Was previously seen following CVA for mobility and balance deficits.  Still seeing speech and language pathologist and occupational therapy.  PAIN:  Are you having pain? Yes: NPRS scale: 3-10 ( at end of day everyday) Pain location: above belt line and radiating laterally  Pain description: nagging,  throbbing pain  Aggravating factors: Standing Relieving factors: Lying down  PRECAUTIONS: None  RED FLAGS: None   WEIGHT BEARING RESTRICTIONS: No  FALLS:  Has patient fallen in last 6 months? No  LIVING ENVIRONMENT: Lives with: lives with SO Lives in: House/apartment Stairs: No Has following equipment at home: None  OCCUPATION: Merchandiser, Retail   PLOF: Independent and Independent with basic ADLs  PATIENT GOALS: improve back pain   NEXT MD VISIT: Not scheduled   OBJECTIVE:   Note: Objective measures were completed at Evaluation unless otherwise noted.  DIAGNOSTIC FINDINGS:  No recent images since to 2023 were mild degenerative changes in the lumbar spine were noted  PATIENT SURVEYS:  Modified Oswestry:  MODIFIED OSWESTRY DISABILITY SCALE  Date: 04/25/24 Score  Pain intensity 5 =  Pain medication has no effect on my pain.  2. Personal care (washing, dressing, etc.) 0 =  I can take care of myself normally without causing increased pain.  3. Lifting 5 =  I cannot lift or carry anything at all.  4. Walking 3 =  Pain prevents me from walking more than  mile.  5. Sitting 3 =  Pain prevents me from sitting more than  hour.  6. Standing 2 =  Pain prevents me from standing more than 1 hour  7. Sleeping 1 = I can sleep well only by using pain medication.  8. Social Life 2 = Pain prevents me from participating in more energetic activities (eg. sports, dancing).  9. Traveling 2 =  My pain restricts my travel over 2 hours.  10. Employment/ Homemaking 3 = Pain prevents me from doing anything but light duties.  Total 26/50   Interpretation of scores: Score Category Description  0-20% Minimal Disability The patient can cope with most living activities. Usually no treatment is indicated apart from advice on lifting, sitting and exercise  21-40% Moderate Disability The patient experiences more pain and difficulty with sitting, lifting and standing. Travel and social life are more  difficult and they may be disabled from work. Personal care, sexual activity and sleeping are not grossly affected, and the patient can usually be managed by conservative means  41-60% Severe Disability Pain remains the main problem in this group, but activities of daily living are affected. These patients require a detailed investigation  61-80% Crippled Back pain impinges on all aspects of the patients life. Positive intervention is required  81-100% Bed-bound These patients are either bed-bound or exaggerating their symptoms  Bluford FORBES Zoe DELENA Karon DELENA, et al. Surgery versus conservative management of stable thoracolumbar fracture: the PRESTO feasibility RCT. [place] ([place]): [employer]; 2021 Nov. [facility] Technology Assessment, No. 25.62.) Appendix 3, Oswestry Disability Index category descriptors. Available from: Findjewelers.cz  Minimally Clinically Important Difference (MCID) = 12.8%  COGNITION: Overall cognitive status: Within functional limits for tasks assessed     SENSATION: WFL  MUSCLE LENGTH: Hamstrings: WFL during DLLT   POSTURE: rounded shoulders  PALPATION: Tender to palpation along lumbar paraspinals, origin of gluteal muscles, and some along thoracic paraspinals.  Patient had most pain along lumbar paraspinals and gluteal musculature on the left greater than right side  LUMBAR ROM:   AROM eval  Flexion WNL  Extension Min - tight   Right lateral flexion To knee- tight  Left lateral flexion To knee - tight   Right rotation WNL- tight   Left rotation WNL tight    (Blank rows = not tested)  LOWER EXTREMITY ROM:    Further hip range of motion may be beneficial  LOWER EXTREMITY MMT:    MMT Right eval Left eval  Hip flexion 4 4  Hip extension    Hip abduction 4+ 4+  Hip adduction 5 5  Hip internal rotation    Hip external rotation    Knee flexion 5 5  Knee extension 5 5  Ankle dorsiflexion 5 5  Ankle  plantarflexion    Ankle inversion    Ankle eversion     (Blank rows = not tested) Measured in seated position   LUMBAR SPECIAL TESTS:  DLLT: could not sustain past 60 degrees  FUNCTIONAL TESTS:     TREATMENT DATE: 06/11/2024   Manual therapy   STM to Bil thoracic paraspinals x 8 min   Grade 2-3 thoracic CPA mobilizations, 30 sec per segment T4-T12    TE- To improve strength, endurance, mobility, and function of specific targeted muscle groups or improve joint range of motion or improve muscle flexibility  Nustep B UE and LE movement training with MHP on lower back throughout x min L 3-4 - denies pain. X 10 min   SL open book stretch 2 x 10 ea side - cues for longer hold time   Single leg bridge 2 x 8 ea LE   Bird dogs 2 x 5 ea LE   Side plank on wall 10 x 10 sec ea side    Standing shoulder extension 2 x 15 BTB  PATIENT EDUCATION: Education details: POC Person educated: Patient Education method: Explanation Education comprehension: verbalized understanding   HOME EXERCISE PROGRAM:  Access Code: [email or web address] URL: [email or web address] Date: 06/04/2024 Prepared by: Reyes London  Exercises - Clamshell  - 1 x daily - 7 x weekly - 3 sets - 10 reps - Bird Dog Progression  - 1 x daily - 7 x weekly - 1 sets - 10 reps - Sidelying Thoracic Rotation with Open Book  - 1 x daily - 7 x weekly - 1 sets - 10 reps - 5 sec hold - Seated Shoulder Row with Anchored Resistance  - 3 x weekly - 3 sets - 10 reps - Seated Shoulder Horizontal Abduction with Resistance  - 3 x weekly - 3 sets - 10 reps - Shoulder External Rotation and Scapular Retraction with Resistance  - 3 x weekly - 3 sets - 10 reps    Access Code: [email or web address] URL: [email or web address] Date: 04/30/2024 Prepared by: Lonni Gainer  Exercises - Clamshell  - 1 x daily - 7 x weekly - 3 sets - 10 reps - Bird Dog Progression  - 1 x daily - 7 x weekly - 1 sets - 10 reps - Sidelying  Thoracic Rotation with Open Book  - 1 x daily - 7 x weekly - 1 sets - 10 reps - 5 sec hold   ASSESSMENT:  CLINICAL IMPRESSION:  Patient arrived with good motivation for completion of pt activities. Pt overall with improved symptoms but still with periods of pain after certain prolonged postures. Progressed with core and gluteal strength. Pt will continue to benefit from skilled physical therapy intervention to address impairments, improve QOL, and attain therapy goals.     OBJECTIVE IMPAIRMENTS: Abnormal gait.   ACTIVITY LIMITATIONS: carrying, standing, squatting, and locomotion level  PARTICIPATION LIMITATIONS: meal prep, cleaning, shopping, community activity, and occupation  PERSONAL FACTORS: Past/current experiences, Time since onset of injury/illness/exacerbation, and 3+ comorbidities: HTN, HLD, osteoporosis, B12 deficiency are also affecting patient's functional outcome.   REHAB POTENTIAL: Good  CLINICAL DECISION MAKING: Stable/uncomplicated  EVALUATION COMPLEXITY: Low   GOALS: Goals reviewed with patient? Yes  SHORT TERM GOALS: Target date: 05/23/2024   Patient will  be independent in home exercise program to improve strength/mobility for better functional independence with ADLs. Baseline: No HEP currently  Goal status: INITIAL   LONG TERM GOALS: Target date: 06/19/2024  Patient will improve modified Oswestry pain disability questionnaire by 10 points or greater in order to indicate improvement in her subjective level of back pain as well as her ability to complete functional tasks without pain limitations Baseline: 26 Goal status: INITIAL  2.  Patient will improve double leg limb lowering test from 60 degrees to 30 degrees or greater showing good core control throughout in order to indicate improved strength of anterior core muscles for improved lumbar stability Baseline: 60 degrees patient loses control and has some discomfort Goal status: INITIAL  3.  Patient  will report ability to stand for greater than 1 hour without limitations of pain over to allow her to return to work-related tasks as well as shopping related tasks with less limitation Baseline: When patient stands for 30 minutes per her report she starts to get intense pain that is only relieved by lying down Goal status: INITIAL  4.  Patient report ability to lift light objects from the floor and set them on an object such as a table in order without increase in back pain to show improved functional capacity as well as improved ability to return to work-related tasks Baseline: Patient reports she cannot lift or carry anything at all  Goal status: INITIAL    PLAN:  PT FREQUENCY: 2x/week  PT DURATION: 8 weeks  PLANNED INTERVENTIONS: 97750- Physical Performance Testing, 97110-Therapeutic exercises, 97530- Therapeutic activity, [id number]- Neuromuscular re-education, 97535- Self Care, [id number]- Manual therapy, [id number]- Gait training, [phone] (1-2 muscles), 20561 (3+ muscles)- Dry Needling, Patient/Family education, Balance training, Stair training, and Moist heat.  PLAN FOR NEXT SESSION:   Progress postural strengthening/awareness Reassess hip mobility following previous session. Standing endurance - Standing side plank and bird dog on wall   Lonni KATHEE Gainer, PT 06/11/2024, 2:54 PM

## 2024-06-11 NOTE — Therapy (Signed)
 compl OUTPATIENT SPEECH LANGUAGE PATHOLOGY  TREATMENT   Patient Name: Ashley Collins MRN: [id number] DOB:06/26/1961, 63 y.o., female Today's Date: 06/11/2024  PCP: Gaetana Haddock, NP  REFERRING PROVIDER: same  Speech Therapy Progress Note  Dates of Reporting Period: 05/02/24 to 06/11/24  Objective: Patient has been seen for 10 speech therapy sessions this reporting period targeting cognitive-communication. Patient is making progress toward LTGs and met all previously 2 of 3 STGs this reporting period. See skilled intervention, clinical impressions, and goals below for details.      End of Session - 06/11/24 1456     Visit Number 30    Number of Visits 47    Date for Recertification  08/06/24    Progress Note Due on Visit 40    SLP Start Time 1445    SLP Stop Time  1530    SLP Time Calculation (min) 45 min    Activity Tolerance Patient tolerated treatment well           Patient Active Problem List   Diagnosis Date Noted   Acute CVA (cerebrovascular accident) (HCC) 12/19/2023   Hypokalemia 12/19/2023   AKI (acute kidney injury) 12/19/2023   Leukocytosis 12/19/2023   Hyperlipidemia, unspecified 12/19/2023   Essential hypertension 12/19/2023   Vision changes 12/19/2023   Lymphedema 05/22/2023   Chronic venous insufficiency 05/22/2023   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 replacement therapy 04/10/2023   Insomnia due to medical condition 04/25/2022   GAD (generalized anxiety disorder) 02/02/2022   Other specified depressive episodes 02/02/2022   Long-term current use of benzodiazepine 02/02/2022   Migraine with aura and without status migrainosus, not intractable 11/03/2020   Arrhythmia 08/06/2020   Status migrainosus 02/25/2019   Osteoporosis 04/09/2013   Vitamin D deficiency 04/09/2013   DDD (degenerative disc disease), lumbosacral 02/08/2005    ONSET DATE: 12/19/23   REFERRING DIAG: CVA- memory deficits  THERAPY DIAG:  Cognitive communication deficit  Rationale  for Evaluation and Treatment Rehabilitation  SUBJECTIVE:   SUBJECTIVE STATEMENT: Pt alert, pleasant, and cooperative. Pt accompanied by: self and significant other  PERTINENT HISTORY & DIAGNOSTIC FINDINGS: Pt is 63 y.o. female who presents today for a cognitive-communication evaluation in setting of stroke. MRI 12/18/23 1. 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 No associated hemorrhage or mass effect. PMHx as outlined above.  PAIN:  Are you having pain? No   FALLS: Has patient fallen in last 6 months?  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 Pt completed with >90% accuracy. Pt able to amend errors when directed to review work.    PATIENT EDUCATION: Education details: as above Person educated: Patient and Spouse Education method: Explanation Education comprehension: verbalized understanding  HOME EXERCISE PROGRAM:        Continue compensations for attention and memory (with emphasis on reading)       GOALS:  Goals reviewed with patient? Yes  SHORT TERM GOALS: Target date: 10 sessions  Pt will complete PROM re: memory.  Baseline: Goal status: MET   2.  Pt will endorse successful implementation of at least x2  compensations for attention and memory.  Baseline:  Goal status: MET  3.  With Moderate A, patient will establish external aid for memory/executive function and bring to more than 50% of therapy sessions.    Baseline:  Goal status: MET  New goals - established 05/14/2024 4. Pt  will utilize compensation for attention/recall (e.g. reading aloud, notetaking) to recall and summarize details in a functional task (e.g. therapy session, reading, tv program).  Baseline:  Goal status: MET   5. Pt will demonstrate alternating attention over 10 minutes between 2 mod complex cognitive-linguistic tasks >80% accuracy with modified Independence. Baseline: Goal status: PROGRESSING; continue goal x1 to ensure accuracy   6. Pt will identify x3 compensations for improved cognitive-communication during functional activities at home/work.   Baseline:   Goal status: MET    LONG TERM GOALS: Target date: 12 weeks  Pt will endorse improvement in cognitive-communication per PROM.  Baseline:  Goal status: MET  2.  Pt and/or husband will demonstrate understanding of ways to promote and support cognitive-communication outside of SLP sessions.  Baseline:  Goal status: PROGRESSING  New goal - established 05/14/24 3. Patient will report engagement in cognitive activities outside of ST 4/7 days.   Baseline:   Goal status: PROGRESSING  ASSESSMENT:  CLINICAL IMPRESSION:  Pt is 63 y.o. female who presents today for a cognitive-communication treatment in setting of stroke. Initial assessment completed via formal means (Cognitive-Linguistic Quick Test) and PROM (Neuro-QoL Adult Cognitive Function v2.0). Pt presents with at least mild cognitive-communication deficits affecting attention and visuospatial skills per CLQT as well as memory and executive functioning per PROM. Suspect CLQT is not as sensitive to higher level cognitive-communication deficits appreciated by pt during iADLs. Pt continues to make excellent progress; however, pt continues with impaired working memory/divided attention and short term recall. Pt planning to transition back to work in in January 2026 and pt would benefit from further compensation training and cognitive retraining to promote a successful transition back to  work. Please see details of today's tx as outlined above. Recommend skilled ST services targeting above mentioned deficits to improve QoL and performance on ADLs/IADLs.  OBJECTIVE IMPAIRMENTS include attention, memory, executive functioning, and visuospatial deficits. These impairments are limiting patient from return to work, managing appointments, household responsibilities, and ADLs/IADLs. Factors affecting potential to achieve goals and functional outcome are medical prognosis.. Patient will benefit from skilled SLP services to address above impairments and improve overall function.  REHAB POTENTIAL: Good  PLAN: SLP FREQUENCY: 1-2x/week  SLP DURATION: 12 weeks  PLANNED INTERVENTIONS: Cueing hierachy, Cognitive reorganization, Internal/external aids, Functional tasks, SLP instruction and feedback, Compensatory strategies, and Patient/family education    Delon Bangs, M.S., CCC-SLP Speech-Language Pathologist [facility] [phone] [place])  [facility] at [facility] [address] [place], [place], [address] Phone: [phone]   Fax:  [phone]

## 2024-06-12 ENCOUNTER — Ambulatory Visit: Admitting: Urology

## 2024-06-13 ENCOUNTER — Ambulatory Visit: Admitting: Occupational Therapy

## 2024-06-13 ENCOUNTER — Ambulatory Visit

## 2024-06-13 ENCOUNTER — Ambulatory Visit: Admitting: Physical Therapy

## 2024-06-13 DIAGNOSIS — M6281 Muscle weakness (generalized): Secondary | ICD-10-CM

## 2024-06-13 DIAGNOSIS — R41841 Cognitive communication deficit: Secondary | ICD-10-CM

## 2024-06-13 DIAGNOSIS — R278 Other lack of coordination: Secondary | ICD-10-CM

## 2024-06-13 NOTE — Therapy (Signed)
 Occupational Therapy Neuro Treatment Note   Patient Name: Ashley Collins MRN: [id number] DOB:[birth year], 63 y.o., female Today's Date: 06/13/2024  PCP: Harvey Gaetana CROME, NP REFERRING PROVIDER: Harvey Gaetana CROME, NP   OT End of Session - 06/13/24 1203     Visit Number 49    Number of Visits 72    Date for Recertification  08/06/24    OT Start Time 1145    OT Stop Time 1230    OT Time Calculation (min) 45 min    Activity Tolerance Patient tolerated treatment well    Behavior During Therapy WFL for tasks assessed/performed               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   Cardiac arrhythmia    Nonspecific ST T wave changes on EKG   Chronic venous insufficiency of lower extremity    Complication of anesthesia    nausea and vomiting   DDD (degenerative disc disease), lumbosacral    Essential hypertension    Headache    History of kidney stones    Hyperlipidemia    Kidney stones    Lymphedema    Migraines    Osteoporosis    PONV (postoperative nausea and vomiting)    Right ureteral stone    Vitamin B12 deficiency    Vitamin D deficiency    Past Surgical History:  Procedure Laterality Date   AUGMENTATION MAMMAPLASTY     CESAREAN SECTION     x 4   COLONOSCOPY WITH PROPOFOL  N/A 03/31/2022   Procedure: COLONOSCOPY WITH PROPOFOL ;  Surgeon: Onita Elspeth Sharper, DO;  Location: [facility] ENDOSCOPY;  Service: Gastroenterology;  Laterality: N/A;   CYSTOSCOPY W/ RETROGRADES Bilateral 07/10/2020   Procedure: CYSTOSCOPY WITH RETROGRADE PYELOGRAM;  Surgeon: Francisca Redell BROCKS, MD;  Location: [facility] ORS;  Service: Urology;  Laterality: Bilateral;   CYSTOSCOPY/URETEROSCOPY/HOLMIUM LASER/STENT PLACEMENT     CYSTOSCOPY/URETEROSCOPY/HOLMIUM LASER/STENT PLACEMENT Bilateral 07/10/2020   Procedure: CYSTOSCOPY/URETEROSCOPY/HOLMIUM LASER/STENT PLACEMENT;  Surgeon: Francisca Redell BROCKS, MD;  Location: [facility] ORS;  Service:  Urology;  Laterality: Bilateral;   CYSTOSCOPY/URETEROSCOPY/HOLMIUM LASER/STENT PLACEMENT Right 08/25/2023   Procedure: CYSTOSCOPY/URETEROSCOPY/HOLMIUM LASER;  Surgeon: Francisca Redell BROCKS, MD;  Location: [facility] ORS;  Service: Urology;  Laterality: Right;   CYSTOSCOPY/URETEROSCOPY/HOLMIUM LASER/STENT PLACEMENT Right 12/15/2023   Procedure: CYSTOSCOPY/URETEROSCOPY/HOLMIUM LASER;  Surgeon: Francisca Redell BROCKS, MD;  Location: [facility] ORS;  Service: Urology;  Laterality: Right;   EXTRACORPOREAL SHOCK WAVE LITHOTRIPSY     x 10 plus   Eye Lift     FACIAL COSMETIC SURGERY     GANGLION CYST EXCISION Right 12/21/2021   Procedure: REMOVAL GANGLION OF WRIST;  Surgeon: Kathlynn Sharper, MD;  Location: [facility] ORS;  Service: Orthopedics;  Laterality: Right;   LIPOSUCTION     TONSILLECTOMY     TRIGGER FINGER RELEASE Right 12/21/2021   Procedure: RELEASE TRIGGER FINGER/A-1 PULLEY;  Surgeon: Kathlynn Sharper, MD;  Location: [facility] ORS;  Service: Orthopedics;  Laterality: Right;   URETEROSCOPY WITH HOLMIUM LASER LITHOTRIPSY     Patient Active Problem List   Diagnosis Date Noted   Acute CVA (cerebrovascular accident) (HCC) 12/19/2023   Hypokalemia 12/19/2023   AKI (acute kidney injury) 12/19/2023   Leukocytosis 12/19/2023   Hyperlipidemia, unspecified 12/19/2023   Essential hypertension 12/19/2023   Vision changes 12/19/2023   Lymphedema 05/22/2023   Chronic venous insufficiency 05/22/2023   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 replacement therapy 04/10/2023  Insomnia due to medical condition 04/25/2022   GAD (generalized anxiety disorder) 02/02/2022   Other specified depressive episodes 02/02/2022   Long-term current use of benzodiazepine 02/02/2022   Migraine with aura and without status migrainosus, not intractable 11/03/2020   Arrhythmia 08/06/2020   Status migrainosus 02/25/2019   Osteoporosis 04/09/2013   Vitamin D deficiency 04/09/2013   DDD (degenerative disc disease), lumbosacral 02/08/2005   ONSET DATE:  12/18/2023  REFERRING DIAG:   THERAPY DIAG:  Muscle weakness (generalized)  Other lack of coordination  Rationale for Evaluation and Treatment: Rehabilitation  SUBJECTIVE:  SUBJECTIVE STATEMENT:   Pt. Reports that she found her glasses.  Pt accompanied by: significant other  PERTINENT HISTORY: Pt. has Hx of stroke with onset 12/18/2023. Pt. PMHx includes: HTN, Hyperlipidemia, Kidney Stones, near syncopal event, loss of vision.  PRECAUTIONS: None  WEIGHT BEARING RESTRICTIONS: None  PAIN:  Are you having pain? No  FALLS: Has patient fallen in last 6 months? Yes. Number of falls    LIVING ENVIRONMENT: Lives with: lives with their family Lives in: [place], [place] Stairs: No, inside the house, yes but does not use Has following equipment at home:   PLOF: Independent  PATIENT GOALS: To be able to see  OBJECTIVE:   HAND DOMINANCE: Right  ADLs:  Eating: Drinking from straw is different, able to use utensils.  Grooming: Fatigues completing hair care.  UB Dressing: independent, if clothes are in front of her she can find clothing LB Dressing: Independent Toileting: Independent Bathing: Independent Tub Shower transfers: Independent Equipment: none Has difficulty with using cell phone  IADLs: Shopping: Does not typically go out shopping, and has not tried to. Light housekeeping: Pt. reports that she does not typically do house cleaning. Pt. has been able to unpack belongings from boxes due to her recent move in. Meal Prep: Pt. reports that is does not currently cook, however reports that she probably could. Has difficulty opening bottle caps/lids. Community mobility: Requires assistance from husband to navigate  through buildings, negotiate stairs-Pt. with increased fear of falling  Medication management: Able to push down medication bottles to open  Financial management: No change in the process-uses automatic bill pay system.  Has difficulty with using cell  phone Handwriting: N/T Work: Pt. was actively working managing a health visitor, works a lot of hours  MOBILITY STATUS: Independent and Needs Assist: Requires hand on hand assistance with nvigating through environments.   POSTURE COMMENTS:  No Significant postural limitations Sitting balance: Good  ACTIVITY TOLERANCE: Activity tolerance: Good  FUNCTIONAL OUTCOME MEASURES:   UPPER EXTREMITY ROM:    Active ROM Right Eval WFL Left Eval [facility]  Shoulder flexion    Shoulder abduction    Shoulder adduction    Shoulder extension    Shoulder internal rotation    Shoulder external rotation    Elbow flexion    Elbow extension    Wrist flexion    Wrist extension    Wrist ulnar deviation    Wrist radial deviation    Wrist pronation    Wrist supination    (Blank rows = not tested)  UPPER EXTREMITY MMT:     MMT Right eval Right 01/29/24 Right  04/04/24 Left eval Left 01/29/24 Left 04/04/24  Shoulder flexion 4-/5 5/5 5/5 4-/5 5/5 5/5  Shoulder abduction 4-/5 5/5 5/5 4-/5 5/5 5/5  Shoulder adduction        Shoulder extension        Shoulder internal rotation  Shoulder external rotation        Middle trapezius        Lower trapezius        Elbow flexion 5/5 5/5 5/5 5/5 5/5 5/5  Elbow extension 5/5 5/5 5/5 5/5 5/5 5/5  Wrist flexion 4/5 5/5 5/5 4-/5 5/5 5/5  Wrist extension 4/5 5/5 5/5 4-/5 5/5 5/5  Wrist ulnar deviation        Wrist radial deviation        Wrist pronation        Wrist supination        (Blank rows = not tested)  HAND FUNCTION: Grip strength: Right: 39 lbs; Left: 28 lbs, Lateral pinch: Right: 11 lbs, Left: 9 lbs, and 3 point pinch: Right: 9 lbs, Left: 9 lbs  01/29/24 Grip strength: Right: 39 lbs; Left: 36 lbs, Lateral pinch: Right: 14 lbs, Left: 12 lbs, and 3 point pinch: Right: 12 lbs, Left: 11 lbs  04/04/24:  Grip strength: Right: 37 lbs; Left: 42 lbs  05/14/24  Grip strength: Right: 55 lbs; Left: 47 lbs  COORDINATION:  9 Hole Peg  test: Right: 30 sec; Left: 36 sec  01/29/24 9 Hole Peg test: Right: 24 sec; Left: 23 sec.  04/04/24:  9 Hole Peg test: Right: 28 sec; Left: 33 sec.  05/14/24:  9 Hole Peg test: Right: 23 sec; Left: 22 sec.  SENSATION: WFL  EDEMA:   MUSCLE TONE:   COGNITION: Overall cognitive status: Within functional limits for tasks assessed  VISION: Subjective report: Pt. reports changes in her vision at onset of stroke, starting with blurry vision, resulting in vision loss in the R eye.  Baseline vision: Pt. Is able to visually track  Visual history: Hx of eye surgery  VISION ASSESSMENT: TBD  PERCEPTION: WFL  PRAXIS: WFL  OBSERVATIONS:                                                                                                                    TREATMENT DATE: 06/13/24    Therapeutic Activities:   -Facilitated writing skills focused on spacing of word, and phrases within a small confined space using simulated personal checks with the size minimized to increase the complexity of the task. -Pt. worked on a multi-step task of filling out the checks, then completing a check register combined with dual tasking while carrying on a conversation. -Facilitated visual memory tasks focused on recalling information about the checks that she ha filled out.    PATIENT EDUCATION:  Compensatory strategies for visual perceptual functioning  HOME EXERCISE PROGRAM:  -Upgraded to blue theraputty for gross grip strengthening. -Green Theraputty exercises for  hand strengthening at bridge -Visual perceptual: Flip flop target design patterns. -Visual memory tasks -Writing tasks -visual motor tasks.  GOALS: Goals reviewed with patient? Yes  SHORT TERM GOALS: Target date: 06/25/2024   Pt. Will independently utilize HEP for hand strength, coordination, and visual compensatory strategies ADL/ADLs Baseline: 05/14/24: Independent 04/12/23: Independent, continue 03/06/24: Independent Eval: No  current HEP  Goal status: Ongoing   LONG TERM GOALS: Target date: 08/06/2024  Pt. Will be able to independently implement visual scanning/visual search compensatory strategies for tasks within her extra personal space navigating through community environments 100% of the time.  Baseline: 01/29/24:  Independent  100% of the time within the therapy gym, and hallway. Eval: Pt. Requires increased assist to navigate through community environments. Goal status: Achieved  2.  Pt. Will be able to independently utilize visual scanning/visual search strategies  during ADLs/IADLs within her near space, and during tabletop tasks, and tasks at a vertical plane with 100% accuracy. Baseline: 05/14/24: 90% simple to moderately complex visual scanning tasks however 75% accuracy for complex visual scanning tasks, and scanning at a vertical plane. 04/11/24: 80% simple to moderately complex visual scanning tasks however 70% accuracy for complex visual scanning tasks, and scanning at a vertical plane.  03/06/24: Initiates visual scanning 85% of the time  01/29/24: 75% Eval: Pt. Education to be provided.  Goal status: Progressing Ongoing  3.  Pt. Will be able to independently initiate visual scanning techniques in her own environment to reduce risk of falls.  Baseline: 01/29/24: Education was provided, Pt. is utlizing visual compensatory strategies/visual scanning within her home. Eval: Pt. Education to be provided.  Goal status: Achieved  4.  Pt. Will increase BUE Grip Strength by 5# to be able to independently and securely hold objects for ADL/IADL use. Baseline: 05/14/24: Grip strength: Right: 55 lbs; Left: 47 lbs Pt. Has improved with holding items within her hand without dropping them.04/11/24: Continue- recent revision 04/04/2024: Grip strength: Right: 37 lbs; Left: 42 lbs patient is dropping items from her hands 01/29/24: Grip strength: Right: 39 lbs; Left: 28 lbs, Eval: Right Grip: 39#, Left Grip: 28# Goal status:  Achieved  5.  Pt. Will increase L Lateral Pinch strength by 2# to be able to open bottles. Baseline: 05/14/24: Pt. is able to open previously opened jars/bottles. Has difficulty with unopened jars/bottles.04/04/2024: Grip strength: Right: 37 lbs; Left: 42 lbs 01/29/24: Lateral pinch: Right: 14 lbs, Left: 12 lbs Eval: Lateral pinch: Right: 11 lbs, Left: 9 lbs  Goal status: Deferred  6.  Pt. Will increase BUE strength for shoulder flexion/abduction by 2 mm grades to independently complete hair care tasks. Baseline:  03/16/2024: 5/5 01/29/24: 5/5 overall Eval: Right Shoulder Flexion: 4-/5, Left Shoulder Flexion:4-/5, Right Shoulder Abduction: 4-/5, Left Shoulder Abduction: 4-/5 Goal status: Achieved  7.  Pt will be able to independently and efficiently manipulate medication without dropping them.  Baseline: 05/14/24: 9 Hole Peg test: Right: 23 sec; Left: 22 sec. 04/11/24: Continue, recent revision 04/04/2024: 9 Hole Peg test: Right: 28 sec; Left: 33 sec. 01/29/24: Independent 01/15/24: Daughter currently manages medication set up.  Goal status: Ongoing  8. Pt will increase typing speed to 20-30 words per minute with at least 90% accuracy to work towards more efficient typing for job related  responsibilities.  Baseline: 05/14/24: Pt. Continues to present with multiple mistypes, and requires increased time.05/14/24: 04/11/24: TBD 03/06/24: 10 wpm with 92% accuracy. 01/29/24: Continue 01/15/24: 8wpm x88% accuracy=7 wpm  Goal status: Ongoing  9. Pt will be able to scan small print on store receipts to check for errors with 100% accuracy using visual compensation strategies as needed.  Baseline: 05/14/24: Pt. Is improving with efficiency of visually scanning receipts.04/11/24: Pt. Continues to present with difficulty with visual scanning small  small items efficiently. 03/06/24: Pt. Continues to present with difficulty with visual scanning small  small  items efficiently. 01/29/24: Pt. Continues to have difficulty  scanning small print items. 01/15/24: Not yet attempted; required for return to work/job responsibility  Goal status:  Ongoing  10. Pt. will independently identify 8 items consistently from visual visual memory in preparation or ADLs.    Baseline: 05/14/24: 5-7 items.04/11/24: 5-7 items  while seated at the tabletop. 03/06/24: Pt. is able to identify up to 6 picture items while seated at the tabletop. Pt. is able to consistently Identify 4 letters from visual memory when attention is divided.  01/29/24: Pt. is able to identify 6 items consistently from visual memory.    Gaol status: Ongoing      11. Pt. will be independently write 4 sentences  efficiently with 100% legibility, no deviation from writing on a blank line, and appropriate spacing between letters.              Baseline: 05/14/24: 4 lines with 80% legibility in printed form, completed in 2 min. & 16 sec. 04/11/24: Continue 03/06/24: 4 lines with 75% legibility, positive deviation below the line, and excessive spacing between the words.              Goal status: Ongoing  ASSESSMENT:  CLINICAL IMPRESSION:  Pt. was able to complete the check task with increased time required to complete the task. Pt. completed the task with 100% legibility, with appropriate spacing.  Pt. Was able to recall the details on 3 checks from visual memory. Pt. Presented with difficulty recalling specifics from more than 3 checks requiring cues to complete. Pt. continues to benefit from Occupational Therapy services to improve her ability to use visual compensatory strategies, and improve overall BUE functioning in order to improve engagement in, and maximize overall independence with ADL, and IADL tasks.        PERFORMANCE DEFICITS: in functional skills including ADLs, IADLs, coordination, dexterity, Fine motor control, and vision, and psychosocial skills including coping strategies, environmental adaptation, habits, interpersonal interactions, and routines and  behaviors.   IMPAIRMENTS: are limiting patient from ADLs, IADLs, rest and sleep, work, leisure, and social participation.   CO-MORBIDITIES: may have co-morbidities  that affects occupational performance. Patient will benefit from skilled OT to address above impairments and improve overall function.  MODIFICATION OR ASSISTANCE TO COMPLETE EVALUATION: Min-Moderate modification of tasks or assist with assess necessary to complete an evaluation.  OT OCCUPATIONAL PROFILE AND HISTORY: Detailed assessment: Review of records and additional review of physical, cognitive, psychosocial history related to current functional performance.  CLINICAL DECISION MAKING: Moderate - several treatment options, min-mod task modification necessary  REHAB POTENTIAL: Good  EVALUATION COMPLEXITY: Moderate    PLAN:  OT FREQUENCY: 2x/week  OT DURATION: 12 weeks  PLANNED INTERVENTIONS: 97168 OT Re-evaluation, 97535 self care/ADL training, 02889 therapeutic exercise, 97530 therapeutic activity, 97112 neuromuscular re-education, visual/perceptual remediation/compensation, patient/family education, and DME and/or AE instructions  RECOMMENDED OTHER SERVICES: ST  CONSULTED AND AGREED WITH PLAN OF CARE: Patient and family member/caregiver  PLAN FOR NEXT SESSION: Treatment  Richardson Otter, MS, OTR/L  06/13/2024, 10:54 PM

## 2024-06-13 NOTE — Therapy (Signed)
 OUTPATIENT SPEECH LANGUAGE PATHOLOGY  TREATMENT   Patient Name: Ashley Collins MRN: [id number] DOB:07/17/1960, 63 y.o., female Today's Date: 06/13/2024  PCP: Gaetana Haddock, NP  REFERRING PROVIDER: same    End of Session - 06/13/24 1055     Visit Number 31    Number of Visits 47    Date for Recertification  08/06/24    Progress Note Due on Visit 40    SLP Start Time 1100    SLP Stop Time  1145    SLP Time Calculation (min) 45 min    Activity Tolerance Patient tolerated treatment well           Patient Active Problem List   Diagnosis Date Noted   Acute CVA (cerebrovascular accident) (HCC) 12/19/2023   Hypokalemia 12/19/2023   AKI (acute kidney injury) 12/19/2023   Leukocytosis 12/19/2023   Hyperlipidemia, unspecified 12/19/2023   Essential hypertension 12/19/2023   Vision changes 12/19/2023   Lymphedema 05/22/2023   Chronic venous insufficiency 05/22/2023   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 replacement therapy 04/10/2023   Insomnia due to medical condition 04/25/2022   GAD (generalized anxiety disorder) 02/02/2022   Other specified depressive episodes 02/02/2022   Long-term current use of benzodiazepine 02/02/2022   Migraine with aura and without status migrainosus, not intractable 11/03/2020   Arrhythmia 08/06/2020   Status migrainosus 02/25/2019   Osteoporosis 04/09/2013   Vitamin D deficiency 04/09/2013   DDD (degenerative disc disease), lumbosacral 02/08/2005    ONSET DATE: 12/19/23   REFERRING DIAG: CVA- memory deficits  THERAPY DIAG:  Cognitive communication deficit  Rationale for Evaluation and Treatment Rehabilitation  SUBJECTIVE:   SUBJECTIVE STATEMENT: Pt alert, pleasant, and cooperative. Pt accompanied by: self and significant other  PERTINENT HISTORY & DIAGNOSTIC FINDINGS: Pt is 63 y.o. female who presents today for a cognitive-communication evaluation in setting of stroke. MRI 12/18/23 1. 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 No associated hemorrhage or mass effect. PMHx as outlined above.  PAIN:  Are you having pain? No   FALLS: Has patient fallen in last 6 months?  See PT evaluation for details  LIVING ENVIRONMENT: Lives with: lives with their spouse Lives in: House/apartment  PLOF:  Level of assistance: Independent with ADLs Employment: Full-time employment; prior stroke was working full time at a [employer]   PATIENT GOALS  to return to PLOF   OBJECTIVE:  TODAY'S TREATMENT:   Alternating attention targeted with pt having to switch between x2 mod complex tasks (counting money/functional math word problems, reading and summarizing a work-related article). Pt counting mixed dollar/coin amounts with 100% accuracy, functional math word problems with 70% accuracy. Pt able to recall x5 details of article with familiar content independently.    PATIENT EDUCATION: Education details: as above Person educated: Patient and Spouse Education method: Explanation Education comprehension: verbalized understanding  HOME EXERCISE PROGRAM:        Continue compensations for attention and memory (with emphasis on reading)       GOALS:  Goals reviewed with patient? Yes  SHORT TERM GOALS: Target date: 10 sessions  Pt will complete PROM re: memory.  Baseline: Goal status: MET   2.  Pt will endorse successful implementation of at least x2 compensations for attention and memory.  Baseline:  Goal status: MET  3.  With Moderate A, patient will establish external aid for memory/executive function and bring to more than 50% of therapy sessions.    Baseline:  Goal status: MET  New goals -  established 05/14/2024 4. Pt will utilize compensation for attention/recall (e.g. reading aloud, notetaking) to recall and summarize details in a functional task (e.g. therapy session, reading, tv program).  Baseline:  Goal status: MET   5. Pt will demonstrate alternating attention  over 10 minutes between 2 mod complex cognitive-linguistic tasks >80% accuracy with modified Independence. Baseline: Goal status: PROGRESSING; continue goal x1 to ensure accuracy   6. Pt will identify x3 compensations for improved cognitive-communication during functional activities at home/work.   Baseline:   Goal status: MET    LONG TERM GOALS: Target date: 12 weeks  Pt will endorse improvement in cognitive-communication per PROM.  Baseline:  Goal status: MET  2.  Pt and/or husband will demonstrate understanding of ways to promote and support cognitive-communication outside of SLP sessions.  Baseline:  Goal status: PROGRESSING  New goal - established 05/14/24 3. Patient will report engagement in cognitive activities outside of ST 4/7 days.   Baseline:   Goal status: PROGRESSING  ASSESSMENT:  CLINICAL IMPRESSION:  Pt is 63 y.o. female who presents today for a cognitive-communication treatment in setting of stroke. Initial assessment completed via formal means (Cognitive-Linguistic Quick Test) and PROM (Neuro-QoL Adult Cognitive Function v2.0). Pt presents with at least mild cognitive-communication deficits affecting attention and visuospatial skills per CLQT as well as memory and executive functioning per PROM. Suspect CLQT is not as sensitive to higher level cognitive-communication deficits appreciated by pt during iADLs. Pt continues to make excellent progress; however, pt continues with impaired working memory/divided attention and short term recall. Pt planning to transition back to work in in January 2026 and pt would benefit from further compensation training and cognitive retraining to promote a successful transition back to work. Please see details of today's tx as outlined above. Recommend skilled ST services targeting above mentioned deficits to improve QoL and performance on ADLs/IADLs.  OBJECTIVE IMPAIRMENTS include attention, memory, executive functioning, and  visuospatial deficits. These impairments are limiting patient from return to work, managing appointments, household responsibilities, and ADLs/IADLs. Factors affecting potential to achieve goals and functional outcome are medical prognosis.. Patient will benefit from skilled SLP services to address above impairments and improve overall function.  REHAB POTENTIAL: Good  PLAN: SLP FREQUENCY: 1-2x/week  SLP DURATION: 12 weeks  PLANNED INTERVENTIONS: Cueing hierachy, Cognitive reorganization, Internal/external aids, Functional tasks, SLP instruction and feedback, Compensatory strategies, and Patient/family education    Delon Bangs, M.S., CCC-SLP Speech-Language Pathologist [facility] [phone] [place])  [facility] at [facility] [address] [place], [place], [address] Phone: [phone]   Fax:  [phone]

## 2024-06-17 ENCOUNTER — Ambulatory Visit: Admitting: Occupational Therapy

## 2024-06-17 ENCOUNTER — Ambulatory Visit

## 2024-06-18 ENCOUNTER — Ambulatory Visit: Admitting: Occupational Therapy

## 2024-06-18 ENCOUNTER — Ambulatory Visit: Admitting: Physical Therapy

## 2024-06-18 ENCOUNTER — Ambulatory Visit

## 2024-06-18 DIAGNOSIS — R278 Other lack of coordination: Secondary | ICD-10-CM

## 2024-06-18 DIAGNOSIS — M546 Pain in thoracic spine: Secondary | ICD-10-CM

## 2024-06-18 DIAGNOSIS — M5459 Other low back pain: Secondary | ICD-10-CM

## 2024-06-18 DIAGNOSIS — R41841 Cognitive communication deficit: Secondary | ICD-10-CM

## 2024-06-18 DIAGNOSIS — M6281 Muscle weakness (generalized): Secondary | ICD-10-CM

## 2024-06-18 DIAGNOSIS — R262 Difficulty in walking, not elsewhere classified: Secondary | ICD-10-CM

## 2024-06-18 NOTE — Therapy (Signed)
 " OUTPATIENT SPEECH LANGUAGE PATHOLOGY  TREATMENT   Patient Name: Ashley Collins MRN: [id number] DOB:May 19, 1961, 63 y.o., female Today's Date: 06/18/2024  PCP: Gaetana Haddock, NP  REFERRING PROVIDER: same    End of Session - 06/18/24 1428     Visit Number 32    Number of Visits 47    Date for Recertification  08/06/24    SLP Start Time 1315    SLP Stop Time  1400    SLP Time Calculation (min) 45 min    Activity Tolerance Patient tolerated treatment well           Patient Active Problem List   Diagnosis Date Noted   Acute CVA (cerebrovascular accident) (HCC) 12/19/2023   Hypokalemia 12/19/2023   AKI (acute kidney injury) 12/19/2023   Leukocytosis 12/19/2023   Hyperlipidemia, unspecified 12/19/2023   Essential hypertension 12/19/2023   Vision changes 12/19/2023   Lymphedema 05/22/2023   Chronic venous insufficiency 05/22/2023   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 replacement therapy 04/10/2023   Insomnia due to medical condition 04/25/2022   GAD (generalized anxiety disorder) 02/02/2022   Other specified depressive episodes 02/02/2022   Long-term current use of benzodiazepine 02/02/2022   Migraine with aura and without status migrainosus, not intractable 11/03/2020   Arrhythmia 08/06/2020   Status migrainosus 02/25/2019   Osteoporosis 04/09/2013   Vitamin D deficiency 04/09/2013   DDD (degenerative disc disease), lumbosacral 02/08/2005    ONSET DATE: 12/19/23   REFERRING DIAG: CVA- memory deficits  THERAPY DIAG:  Cognitive communication deficit  Rationale for Evaluation and Treatment Rehabilitation  SUBJECTIVE:   SUBJECTIVE STATEMENT: Pt alert, pleasant, and cooperative. Pt accompanied by: self and significant other  PERTINENT HISTORY & DIAGNOSTIC FINDINGS: Pt is 63 y.o. female who presents today for a cognitive-communication evaluation in setting of stroke. MRI 12/18/23 1. 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 No  associated hemorrhage or mass effect. PMHx as outlined above.  PAIN:  Are you having pain? No   FALLS: Has patient fallen in last 6 months?  See PT evaluation for details  LIVING ENVIRONMENT: Lives with: lives with their spouse Lives in: House/apartment  PLOF:  Level of assistance: Independent with ADLs Employment: Full-time employment; prior stroke was working full time at a [employer]   PATIENT GOALS  to return to PLOF   OBJECTIVE:  TODAY'S TREATMENT:   Introduced Education Officer, Museum for improved executive functioning. Handout provided. Discussed rationale for STAR method, how to carry out STAR method, and scenarios for using STAR method. Pt able to talk through real life scenarios where STAR method may have helped her ability to complete tasks in a timely manner and without error.    PATIENT EDUCATION: Education details: as above Person educated: Patient and Spouse Education method: Explanation Education comprehension: verbalized understanding  HOME EXERCISE PROGRAM:        Continue compensations for attention and memory (with emphasis on reading)       GOALS:  Goals reviewed with patient? Yes  SHORT TERM GOALS: Target date: 10 sessions  Pt will complete PROM re: memory.  Baseline: Goal status: MET   2.  Pt will endorse successful implementation of at least x2 compensations for attention and memory.  Baseline:  Goal status: MET  3.  With Moderate A, patient will establish external aid for memory/executive function and bring to more than 50% of therapy sessions.    Baseline:  Goal status: MET  New goals - established 05/14/2024 4. Pt will utilize  compensation for attention/recall (e.g. reading aloud, notetaking) to recall and summarize details in a functional task (e.g. therapy session, reading, tv program).  Baseline:  Goal status: MET   5. Pt will demonstrate alternating attention over 10 minutes between 2 mod complex cognitive-linguistic  tasks >80% accuracy with modified Independence. Baseline: Goal status: PROGRESSING; continue goal x1 to ensure accuracy   6. Pt will identify x3 compensations for improved cognitive-communication during functional activities at home/work.   Baseline:   Goal status: MET    LONG TERM GOALS: Target date: 12 weeks  Pt will endorse improvement in cognitive-communication per PROM.  Baseline:  Goal status: MET  2.  Pt and/or husband will demonstrate understanding of ways to promote and support cognitive-communication outside of SLP sessions.  Baseline:  Goal status: PROGRESSING  New goal - established 05/14/24 3. Patient will report engagement in cognitive activities outside of ST 4/7 days.   Baseline:   Goal status: PROGRESSING  ASSESSMENT:  CLINICAL IMPRESSION:  Pt is 63 y.o. female who presents today for a cognitive-communication treatment in setting of stroke. Initial assessment completed via formal means (Cognitive-Linguistic Quick Test) and PROM (Neuro-QoL Adult Cognitive Function v2.0). Pt presents with at least mild cognitive-communication deficits affecting attention and visuospatial skills per CLQT as well as memory and executive functioning per PROM. Suspect CLQT is not as sensitive to higher level cognitive-communication deficits appreciated by pt during iADLs. Pt continues to make excellent progress; however, pt continues with impaired working memory/divided attention and short term recall. Pt planning to transition back to work in in January 2026 and pt would benefit from further compensation training and cognitive retraining to promote a successful transition back to work. Please see details of today's tx as outlined above. Recommend skilled ST services targeting above mentioned deficits to improve QoL and performance on ADLs/IADLs.  OBJECTIVE IMPAIRMENTS include attention, memory, executive functioning, and visuospatial deficits. These impairments are limiting patient from  return to work, managing appointments, household responsibilities, and ADLs/IADLs. Factors affecting potential to achieve goals and functional outcome are medical prognosis.. Patient will benefit from skilled SLP services to address above impairments and improve overall function.  REHAB POTENTIAL: Good  PLAN: SLP FREQUENCY: 1-2x/week  SLP DURATION: 12 weeks  PLANNED INTERVENTIONS: Cueing hierachy, Cognitive reorganization, Internal/external aids, Functional tasks, SLP instruction and feedback, Compensatory strategies, and Patient/family education    Delon Bangs, M.S., CCC-SLP Speech-Language Pathologist [facility] [phone] [place])  [facility] at [facility] [address] [place], [place], [address] Phone: [phone]   Fax:  [phone]            "

## 2024-06-18 NOTE — Therapy (Signed)
 "   Occupational Therapy Progress Note  Dates of reporting period  05/14/24   to   06/18/24    Patient Name: Ashley Collins MRN: [id number] DOB:1961-01-05, 63 y.o., female Today's Date: 06/18/2024  PCP: Harvey Gaetana CROME, NP REFERRING PROVIDER: Harvey Gaetana CROME, NP   OT End of Session - 06/18/24 1407     Visit Number 50    Number of Visits 72    Date for Recertification  08/06/24    OT Start Time 1400    OT Stop Time 1445    OT Time Calculation (min) 45 min    Activity Tolerance Patient tolerated treatment well    Behavior During Therapy WFL for tasks assessed/performed               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   Cardiac arrhythmia    Nonspecific ST T wave changes on EKG   Chronic venous insufficiency of lower extremity    Complication of anesthesia    nausea and vomiting   DDD (degenerative disc disease), lumbosacral    Essential hypertension    Headache    History of kidney stones    Hyperlipidemia    Kidney stones    Lymphedema    Migraines    Osteoporosis    PONV (postoperative nausea and vomiting)    Right ureteral stone    Vitamin B12 deficiency    Vitamin D deficiency    Past Surgical History:  Procedure Laterality Date   AUGMENTATION MAMMAPLASTY     CESAREAN SECTION     x 4   COLONOSCOPY WITH PROPOFOL  N/A 03/31/2022   Procedure: COLONOSCOPY WITH PROPOFOL ;  Surgeon: Onita Elspeth Sharper, DO;  Location: [facility] ENDOSCOPY;  Service: Gastroenterology;  Laterality: N/A;   CYSTOSCOPY W/ RETROGRADES Bilateral 07/10/2020   Procedure: CYSTOSCOPY WITH RETROGRADE PYELOGRAM;  Surgeon: Francisca Redell BROCKS, MD;  Location: [facility] ORS;  Service: Urology;  Laterality: Bilateral;   CYSTOSCOPY/URETEROSCOPY/HOLMIUM LASER/STENT PLACEMENT     CYSTOSCOPY/URETEROSCOPY/HOLMIUM LASER/STENT PLACEMENT Bilateral 07/10/2020   Procedure: CYSTOSCOPY/URETEROSCOPY/HOLMIUM LASER/STENT PLACEMENT;  Surgeon:  Francisca Redell BROCKS, MD;  Location: [facility] ORS;  Service: Urology;  Laterality: Bilateral;   CYSTOSCOPY/URETEROSCOPY/HOLMIUM LASER/STENT PLACEMENT Right 08/25/2023   Procedure: CYSTOSCOPY/URETEROSCOPY/HOLMIUM LASER;  Surgeon: Francisca Redell BROCKS, MD;  Location: [facility] ORS;  Service: Urology;  Laterality: Right;   CYSTOSCOPY/URETEROSCOPY/HOLMIUM LASER/STENT PLACEMENT Right 12/15/2023   Procedure: CYSTOSCOPY/URETEROSCOPY/HOLMIUM LASER;  Surgeon: Francisca Redell BROCKS, MD;  Location: [facility] ORS;  Service: Urology;  Laterality: Right;   EXTRACORPOREAL SHOCK WAVE LITHOTRIPSY     x 10 plus   Eye Lift     FACIAL COSMETIC SURGERY     GANGLION CYST EXCISION Right 12/21/2021   Procedure: REMOVAL GANGLION OF WRIST;  Surgeon: Kathlynn Sharper, MD;  Location: [facility] ORS;  Service: Orthopedics;  Laterality: Right;   LIPOSUCTION     TONSILLECTOMY     TRIGGER FINGER RELEASE Right 12/21/2021   Procedure: RELEASE TRIGGER FINGER/A-1 PULLEY;  Surgeon: Kathlynn Sharper, MD;  Location: [facility] ORS;  Service: Orthopedics;  Laterality: Right;   URETEROSCOPY WITH HOLMIUM LASER LITHOTRIPSY     Patient Active Problem List   Diagnosis Date Noted   Acute CVA (cerebrovascular accident) (HCC) 12/19/2023   Hypokalemia 12/19/2023   AKI (acute kidney injury) 12/19/2023   Leukocytosis 12/19/2023   Hyperlipidemia, unspecified 12/19/2023   Essential hypertension 12/19/2023   Vision changes 12/19/2023   Lymphedema 05/22/2023  Chronic venous insufficiency 05/22/2023   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 replacement therapy 04/10/2023   Insomnia due to medical condition 04/25/2022   GAD (generalized anxiety disorder) 02/02/2022   Other specified depressive episodes 02/02/2022   Long-term current use of benzodiazepine 02/02/2022   Migraine with aura and without status migrainosus, not intractable 11/03/2020   Arrhythmia 08/06/2020   Status migrainosus 02/25/2019   Osteoporosis 04/09/2013   Vitamin D deficiency 04/09/2013   DDD (degenerative disc  disease), lumbosacral 02/08/2005   ONSET DATE: 12/18/2023  REFERRING DIAG:   THERAPY DIAG:  Muscle weakness (generalized)  Other lack of coordination  Rationale for Evaluation and Treatment: Rehabilitation  SUBJECTIVE:  SUBJECTIVE STATEMENT:   Pt. Reports that she  returns to work in a couple of weeks on January 12th.  Pt accompanied by: significant other  PERTINENT HISTORY: Pt. has Hx of stroke with onset 12/18/2023. Pt. PMHx includes: HTN, Hyperlipidemia, Kidney Stones, near syncopal event, loss of vision.  PRECAUTIONS: None  WEIGHT BEARING RESTRICTIONS: None  PAIN:  Are you having pain? No  FALLS: Has patient fallen in last 6 months? Yes. Number of falls    LIVING ENVIRONMENT: Lives with: lives with their family Lives in: [place], [place] Stairs: No, inside the house, yes but does not use Has following equipment at home:   PLOF: Independent  PATIENT GOALS: To be able to see  OBJECTIVE:   HAND DOMINANCE: Right  ADLs:  Eating: Drinking from straw is different, able to use utensils.  Grooming: Fatigues completing hair care.  UB Dressing: independent, if clothes are in front of her she can find clothing LB Dressing: Independent Toileting: Independent Bathing: Independent Tub Shower transfers: Independent Equipment: none Has difficulty with using cell phone  IADLs: Shopping: Does not typically go out shopping, and has not tried to. Light housekeeping: Pt. reports that she does not typically do house cleaning. Pt. has been able to unpack belongings from boxes due to her recent move in. Meal Prep: Pt. reports that is does not currently cook, however reports that she probably could. Has difficulty opening bottle caps/lids. Community mobility: Requires assistance from husband to navigate  through buildings, negotiate stairs-Pt. with increased fear of falling  Medication management: Able to push down medication bottles to open  Financial management: No change in the  process-uses automatic bill pay system.  Has difficulty with using cell phone Handwriting: N/T Work: Pt. was actively working managing a health visitor, works a lot of hours  MOBILITY STATUS: Independent and Needs Assist: Requires hand on hand assistance with nvigating through environments.   POSTURE COMMENTS:  No Significant postural limitations Sitting balance: Good  ACTIVITY TOLERANCE: Activity tolerance: Good  FUNCTIONAL OUTCOME MEASURES:   UPPER EXTREMITY ROM:    Active ROM Right Eval WFL Left Eval [facility]  Shoulder flexion    Shoulder abduction    Shoulder adduction    Shoulder extension    Shoulder internal rotation    Shoulder external rotation    Elbow flexion    Elbow extension    Wrist flexion    Wrist extension    Wrist ulnar deviation    Wrist radial deviation    Wrist pronation    Wrist supination    (Blank rows = not tested)  UPPER EXTREMITY MMT:     MMT Right eval Right 01/29/24 Right  04/04/24 Left eval Left 01/29/24 Left 04/04/24  Shoulder flexion 4-/5 5/5 5/5 4-/5 5/5 5/5  Shoulder abduction 4-/5 5/5 5/5 4-/5 5/5 5/5  Shoulder adduction  Shoulder extension        Shoulder internal rotation        Shoulder external rotation        Middle trapezius        Lower trapezius        Elbow flexion 5/5 5/5 5/5 5/5 5/5 5/5  Elbow extension 5/5 5/5 5/5 5/5 5/5 5/5  Wrist flexion 4/5 5/5 5/5 4-/5 5/5 5/5  Wrist extension 4/5 5/5 5/5 4-/5 5/5 5/5  Wrist ulnar deviation        Wrist radial deviation        Wrist pronation        Wrist supination        (Blank rows = not tested)  HAND FUNCTION: Grip strength: Right: 39 lbs; Left: 28 lbs, Lateral pinch: Right: 11 lbs, Left: 9 lbs, and 3 point pinch: Right: 9 lbs, Left: 9 lbs  01/29/24 Grip strength: Right: 39 lbs; Left: 36 lbs, Lateral pinch: Right: 14 lbs, Left: 12 lbs, and 3 point pinch: Right: 12 lbs, Left: 11 lbs  04/04/24:  Grip strength: Right: 37 lbs; Left: 42  lbs  05/14/24  Grip strength: Right: 55 lbs; Left: 47 lbs  06/18/24  Grip strength: Right: 57 lbs; Left: 47 lbs Lateral pinch: Right: 15 lbs, Left: 12 lbs, and 3 point pinch: Right: 14 lbs, Left: 11 lbs  COORDINATION:  9 Hole Peg test: Right: 30 sec; Left: 36 sec  01/29/24 9 Hole Peg test: Right: 24 sec; Left: 23 sec.  04/04/24:  9 Hole Peg test: Right: 28 sec; Left: 33 sec.  05/14/24:  9 Hole Peg test: Right: 23 sec; Left: 22 sec.  SENSATION: WFL  EDEMA:   MUSCLE TONE:   COGNITION: Overall cognitive status: Within functional limits for tasks assessed  VISION: Subjective report: Pt. reports changes in her vision at onset of stroke, starting with blurry vision, resulting in vision loss in the R eye.  Baseline vision: Pt. Is able to visually track  Visual history: Hx of eye surgery  VISION ASSESSMENT: TBD  PERCEPTION: WFL  PRAXIS: WFL  OBSERVATIONS:                                                                                                                    TREATMENT DATE: 06/18/24    Measurements were obtained, and goals were reviewed with the Pt.    PATIENT EDUCATION:  Compensatory strategies for visual perceptual functioning  HOME EXERCISE PROGRAM:  -Upgraded to blue theraputty for gross grip strengthening. -Green Theraputty exercises for  hand strengthening at bridge -Visual perceptual: Flip flop target design patterns. -Visual memory tasks -Writing tasks -visual motor tasks.  GOALS: Goals reviewed with patient? Yes  SHORT TERM GOALS: Target date: 06/25/2024   Pt. Will independently utilize HEP for hand strength, coordination, and visual compensatory strategies ADL/ADLs Baseline:06/18/24: Independent 05/14/24: Independent 04/12/23: Independent, continue 03/06/24: Independent Eval: No  current HEP  Goal status: Ongoing   LONG TERM GOALS: Target date: 08/06/2024  Pt. Will  be able to independently implement visual scanning/visual search  compensatory strategies for tasks within her extra personal space navigating through community environments 100% of the time.  Baseline: 01/29/24:  Independent  100% of the time within the therapy gym, and hallway. Eval: Pt. Requires increased assist to navigate through community environments. Goal status: Achieved  2.  Pt. Will be able to independently utilize visual scanning/visual search strategies  during ADLs/IADLs within her near space, and during tabletop tasks, and tasks at a vertical plane with 100% accuracy. Baseline: 06/18/24: 100% simple to complex visual scanning tasks however 75% accuracy for  moderate tocomplex visual scanning tasks at a horizontal plane tas, and scanning at a vertical plane. Pt. Pt. Is able to visually scan for multiple different items at once with 60% accuracy. 07/15/23: 90% simple to moderately complex visual scanning tasks however 75% accuracy for complex visual scanning tasks , and scanning at a vertical plane. 04/11/24: 80% simple to moderately complex visual scanning tasks however 70% accuracy for complex visual scanning tasks, and scanning at a vertical plane.  03/06/24: Initiates visual scanning 85% of the time  01/29/24: 75% Eval: Pt. Education to be provided.  Goal status: Progressing Ongoing  3.  Pt. Will be able to independently initiate visual scanning techniques in her own environment to reduce risk of falls.  Baseline: 01/29/24: Education was provided, Pt. is utlizing visual compensatory strategies/visual scanning within her home. Eval: Pt. Education to be provided.  Goal status: Achieved  4.  Pt. Will increase BUE Grip Strength by 5# to be able to independently and securely hold objects for ADL/IADL use. Baseline: 05/14/24: Grip strength: Right: 55 lbs; Left: 47 lbs Pt. Has improved with holding items within her hand without dropping them.04/11/24: Continue- recent revision 04/04/2024: Grip strength: Right: 37 lbs; Left: 42 lbs patient is dropping items from  her hands 01/29/24: Grip strength: Right: 39 lbs; Left: 28 lbs, Eval: Right Grip: 39#, Left Grip: 28# Goal status: Achieved  5.  Pt. Will increase L Lateral Pinch strength by 2# to be able to open bottles. Baseline: 06/18/24: Lateral pinch: Right: 15 lbs, Left: 12 lbs, and 3 point pinch: Right: 14 lbs, Left: 11 lbs 05/14/24: Pt. is able to open previously opened jars/bottles. Has difficulty with unopened jars/bottles.04/04/2024: Grip strength: Right: 37 lbs; Left: 42 lbs 01/29/24: Lateral pinch: Right: 14 lbs, Left: 12 lbs Eval: Lateral pinch: Right: 11 lbs, Left: 9 lbs  Goal status: Achieved  6.  Pt. Will increase BUE strength for shoulder flexion/abduction by 2 mm grades to independently complete hair care tasks. Baseline:  03/16/2024: 5/5 01/29/24: 5/5 overall Eval: Right Shoulder Flexion: 4-/5, Left Shoulder Flexion:4-/5, Right Shoulder Abduction: 4-/5, Left Shoulder Abduction: 4-/5 Goal status: Achieved  7.  Pt will be able to independently and efficiently manipulate medication without dropping them.  Baseline: 06/18/24: Pt. Is able to manipulate her medication consistently now without dropping them.05/14/24: 9 Hole Peg test: Right: 23 sec; Left: 22 sec. 04/11/24: Continue, recent revision 04/04/2024: 9 Hole Peg test: Right: 28 sec; Left: 33 sec. 01/29/24: Independent 01/15/24: Daughter currently manages medication set up.  Goal status: Ongoing  8. Pt will increase typing speed to 15 words per minute with at least 90% accuracy to work towards more efficient typing for job related  responsibilities.  Baseline: 06/18/24: 10 wpm with 87% accuracy with 8 typos for 4 sentences.05/14/24: Pt. Continues to present with multiple mistypes, and requires increased time.05/14/24: 04/11/24: TBD 03/06/24: 10 wpm with 92% accuracy. 01/29/24: Continue 01/15/24: 8wpm  x88% accuracy=7 wpm  Goal status: Ongoing/Modified to 15 wpm 06/18/24  9. Pt will be able to scan small print on store receipts to check for errors with  100% accuracy using visual compensation strategies as needed.  Baseline: 06/18/24: Independent and efficient with scanning receipts accurately 05/14/24: Pt. Is improving with efficiency of visually scanning receipts.04/11/24: Pt. Continues to present with difficulty with visual scanning small  small items efficiently. 03/06/24: Pt. Continues to present with difficulty with visual scanning small  small items efficiently. 01/29/24: Pt. Continues to have difficulty scanning small print items. 01/15/24: Not yet attempted; required for return to work/job responsibility  Goal status:  Achieved  10. Pt. will independently identify 8 items consistently from visual visual memory in preparation or ADLs.    Baseline: 06/18/24: 5-7 items depending upon the context of the task, less with with increased environmental stimulation, and distractions. 05/14/24: 5-7 items.04/11/24: 5-7 items  while seated at the tabletop. 03/06/24: Pt. is able to identify up to 6 picture items while seated at the tabletop. Pt. is able to consistently Identify 4 letters from visual memory when attention is divided.  01/29/24: Pt. is able to identify 6 items consistently from visual memory.    Gaol status: Ongoing      11. Pt. will be independently write 4 sentences  efficiently with 100% legibility, no deviation from writing on a blank line, and appropriate spacing between letters.              Baseline: 06/18/24: 4 sentences completed in printed form in 2 min. & 35 sec. With positive deviation above the line.05/14/24: 4 lines with 80% legibility in printed form, completed in 2 min. & 16 sec. 04/11/24: Continue 03/06/24: 4 lines with 75% legibility, positive deviation below the line, and excessive spacing between the words.              Goal status: Ongoing  ASSESSMENT:  CLINICAL IMPRESSION:  Pt. has made steady progress during  this progress reporting period. Pt. Is now able to manipulate her medication without dropping it, and has improved  with grip, and pinch strength, and has achieved those goals. Pt. is now able to consistently visual scan  through receipts. Pt. Is improving with visual scanning tasks, however continues to present with difficulty when challenged with increasing the complexity, and changing the context with which she is visually scanning for items. Pt. continues to work on improving visual memory tasks, writing efficiency, and typing speed, and accuracy as Pt. 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 Pt. continues to benefit from Occupational Therapy services to improve her ability to use visual compensatory strategies, and improve overall BUE functioning in order to improve engagement in, and maximize overall independence with ADL, and IADL tasks.        PERFORMANCE DEFICITS: in functional skills including ADLs, IADLs, coordination, dexterity, Fine motor control, and vision, and psychosocial skills including coping strategies, environmental adaptation, habits, interpersonal interactions, and routines and behaviors.   IMPAIRMENTS: are limiting patient from ADLs, IADLs, rest and sleep, work, leisure, and social participation.   CO-MORBIDITIES: may have co-morbidities  that affects occupational performance. Patient will benefit from skilled OT to address above impairments and improve overall function.  MODIFICATION OR ASSISTANCE TO COMPLETE EVALUATION: Min-Moderate modification of tasks or assist with assess necessary to complete an evaluation.  OT OCCUPATIONAL PROFILE AND HISTORY: Detailed assessment: Review of  records and additional review of physical, cognitive, psychosocial history related to current functional performance.  CLINICAL DECISION MAKING: Moderate - several treatment options, min-mod task modification necessary  REHAB POTENTIAL: Good  EVALUATION COMPLEXITY: Moderate    PLAN:  OT FREQUENCY:  2x/week  OT DURATION: 12 weeks  PLANNED INTERVENTIONS: 97168 OT Re-evaluation, 97535 self care/ADL training, 02889 therapeutic exercise, 97530 therapeutic activity, 97112 neuromuscular re-education, visual/perceptual remediation/compensation, patient/family education, and DME and/or AE instructions  RECOMMENDED OTHER SERVICES: ST  CONSULTED AND AGREED WITH PLAN OF CARE: Patient and family member/caregiver  PLAN FOR NEXT SESSION: Treatment  Richardson Otter, MS, OTR/L  06/18/2024, 2:08 PM    "

## 2024-06-18 NOTE — Therapy (Signed)
 " OUTPATIENT PHYSICAL THERAPY THORACOLUMBAR TREATMENT   Patient Name: Ashley Collins MRN: [id number] DOB:1961/02/09, 63 y.o., female Today's Date: 06/18/2024  END OF SESSION:  PT End of Session - 06/18/24 1445     Visit Number 9    Number of Visits 16    Date for Recertification  06/20/24    Progress Note Due on Visit 10    PT Start Time 1445    PT Stop Time 1526    PT Time Calculation (min) 41 min    Equipment Utilized During Treatment Gait belt    Activity Tolerance Patient tolerated treatment well    Behavior During Therapy WFL for tasks assessed/performed               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   Cardiac arrhythmia    Nonspecific ST T wave changes on EKG   Chronic venous insufficiency of lower extremity    Complication of anesthesia    nausea and vomiting   DDD (degenerative disc disease), lumbosacral    Essential hypertension    Headache    History of kidney stones    Hyperlipidemia    Kidney stones    Lymphedema    Migraines    Osteoporosis    PONV (postoperative nausea and vomiting)    Right ureteral stone    Vitamin B12 deficiency    Vitamin D deficiency    Past Surgical History:  Procedure Laterality Date   AUGMENTATION MAMMAPLASTY     CESAREAN SECTION     x 4   COLONOSCOPY WITH PROPOFOL  N/A 03/31/2022   Procedure: COLONOSCOPY WITH PROPOFOL ;  Surgeon: Onita Elspeth Sharper, DO;  Location: [facility] ENDOSCOPY;  Service: Gastroenterology;  Laterality: N/A;   CYSTOSCOPY W/ RETROGRADES Bilateral 07/10/2020   Procedure: CYSTOSCOPY WITH RETROGRADE PYELOGRAM;  Surgeon: Francisca Redell BROCKS, MD;  Location: [facility] ORS;  Service: Urology;  Laterality: Bilateral;   CYSTOSCOPY/URETEROSCOPY/HOLMIUM LASER/STENT PLACEMENT     CYSTOSCOPY/URETEROSCOPY/HOLMIUM LASER/STENT PLACEMENT Bilateral 07/10/2020   Procedure: CYSTOSCOPY/URETEROSCOPY/HOLMIUM LASER/STENT PLACEMENT;  Surgeon: Francisca Redell BROCKS, MD;  Location: [facility] ORS;  Service: Urology;  Laterality: Bilateral;   CYSTOSCOPY/URETEROSCOPY/HOLMIUM LASER/STENT PLACEMENT Right 08/25/2023   Procedure: CYSTOSCOPY/URETEROSCOPY/HOLMIUM LASER;  Surgeon: Francisca Redell BROCKS, MD;  Location: [facility] ORS;  Service: Urology;  Laterality: Right;   CYSTOSCOPY/URETEROSCOPY/HOLMIUM LASER/STENT PLACEMENT Right 12/15/2023   Procedure: CYSTOSCOPY/URETEROSCOPY/HOLMIUM LASER;  Surgeon: Francisca Redell BROCKS, MD;  Location: [facility] ORS;  Service: Urology;  Laterality: Right;   EXTRACORPOREAL SHOCK WAVE LITHOTRIPSY     x 10 plus   Eye Lift     FACIAL COSMETIC SURGERY     GANGLION CYST EXCISION Right 12/21/2021   Procedure: REMOVAL GANGLION OF WRIST;  Surgeon: Kathlynn Sharper, MD;  Location: [facility] ORS;  Service: Orthopedics;  Laterality: Right;   LIPOSUCTION     TONSILLECTOMY     TRIGGER FINGER RELEASE Right 12/21/2021   Procedure: RELEASE TRIGGER FINGER/A-1 PULLEY;  Surgeon: Kathlynn Sharper, MD;  Location: [facility] ORS;  Service: Orthopedics;  Laterality: Right;   URETEROSCOPY WITH HOLMIUM LASER LITHOTRIPSY     Patient Active Problem List   Diagnosis Date Noted   Acute CVA (cerebrovascular accident) (HCC) 12/19/2023   Hypokalemia 12/19/2023   AKI (acute kidney injury) 12/19/2023   Leukocytosis 12/19/2023   Hyperlipidemia, unspecified 12/19/2023   Essential hypertension 12/19/2023   Vision changes 12/19/2023   Lymphedema 05/22/2023   Chronic venous insufficiency 05/22/2023  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 replacement therapy 04/10/2023   Insomnia due to medical condition 04/25/2022   GAD (generalized anxiety disorder) 02/02/2022   Other specified depressive episodes 02/02/2022   Long-term current use of benzodiazepine 02/02/2022   Migraine with aura and without status migrainosus, not intractable 11/03/2020   Arrhythmia 08/06/2020   Status migrainosus 02/25/2019   Osteoporosis 04/09/2013   Vitamin D deficiency 04/09/2013   DDD (degenerative disc disease), lumbosacral  02/08/2005    PCP: Harvey Gaetana CROME, NP   REFERRING PROVIDER: Meeler, Benton CROME, FNP   REFERRING DIAG: M54.16 (ICD-10-CM) - Lumbar radiculitis   Rationale for Evaluation and Treatment: Rehabilitation  THERAPY DIAG:  Muscle weakness (generalized)  Other lack of coordination  Pain in thoracic spine  Other low back pain  Difficulty in walking, not elsewhere classified  ONSET DATE: 1.5 months   SUBJECTIVE:                                                                                                                                                                                           SUBJECTIVE STATEMENT:    Patient reports she has no pain today as her daughter gave her Celebrex (daughter is an [employer]).   From Eval: Pt here to work on pain and discomfort in her mid back. Pt reports the more she is on her feet the more her back hurts and relief comes from being supine. Pt has not had any previous therapy for her back pain. Pt has epidural that assists with her pain below her belt line. Pt still in OT working on her upper extremity and grip strength. Pt has epidural shots for last 3 years. Pt initially hurt her back lifting something to put in her car. Patient can stand for maybe 30 min at a time before pain is no longer tolerable. She reports that sitting down does not improve her pain but lying on her side in would bring relief. Pt has history of osteoporosis and sliped disc in her back. Pt pain in this area is similar to pain she gets epidural for but just further superior into her thoracic spine.    PERTINENT HISTORY:  Patient familiar to this clinic before.  Was previously seen following CVA for mobility and balance deficits.  Still seeing speech and language pathologist and occupational therapy.  PAIN:  Are you having pain? Yes: NPRS scale: 3-10 ( at end of day everyday) Pain location: above belt line and radiating laterally  Pain description: nagging, throbbing pain   Aggravating factors: Standing Relieving factors: Lying down  PRECAUTIONS: None  RED FLAGS: None   WEIGHT BEARING RESTRICTIONS: No  FALLS:  Has patient fallen in last 6 months? No  LIVING ENVIRONMENT: Lives with: lives with SO Lives in: House/apartment Stairs: No Has following equipment at home: None  OCCUPATION: Merchandiser, Retail   PLOF: Independent and Independent with basic ADLs  PATIENT GOALS: improve back pain   NEXT MD VISIT: Not scheduled   OBJECTIVE:   Note: Objective measures were completed at Evaluation unless otherwise noted.  DIAGNOSTIC FINDINGS:  No recent images since to 2023 were mild degenerative changes in the lumbar spine were noted  PATIENT SURVEYS:  Modified Oswestry:  MODIFIED OSWESTRY DISABILITY SCALE  Date: 04/25/24 Score  Pain intensity 5 =  Pain medication has no effect on my pain.  2. Personal care (washing, dressing, etc.) 0 =  I can take care of myself normally without causing increased pain.  3. Lifting 5 =  I cannot lift or carry anything at all.  4. Walking 3 =  Pain prevents me from walking more than  mile.  5. Sitting 3 =  Pain prevents me from sitting more than  hour.  6. Standing 2 =  Pain prevents me from standing more than 1 hour  7. Sleeping 1 = I can sleep well only by using pain medication.  8. Social Life 2 = Pain prevents me from participating in more energetic activities (eg. sports, dancing).  9. Traveling 2 =  My pain restricts my travel over 2 hours.  10. Employment/ Homemaking 3 = Pain prevents me from doing anything but light duties.  Total 26/50   Interpretation of scores: Score Category Description  0-20% Minimal Disability The patient can cope with most living activities. Usually no treatment is indicated apart from advice on lifting, sitting and exercise  21-40% Moderate Disability The patient experiences more pain and difficulty with sitting, lifting and standing. Travel and social life are more difficult and  they may be disabled from work. Personal care, sexual activity and sleeping are not grossly affected, and the patient can usually be managed by conservative means  41-60% Severe Disability Pain remains the main problem in this group, but activities of daily living are affected. These patients require a detailed investigation  61-80% Crippled Back pain impinges on all aspects of the patients life. Positive intervention is required  81-100% Bed-bound These patients are either bed-bound or exaggerating their symptoms  Bluford FORBES Zoe DELENA Karon DELENA, et al. Surgery versus conservative management of stable thoracolumbar fracture: the PRESTO feasibility RCT. [place] ([place]): [employer]; 2021 Nov. [facility] Technology Assessment, No. 25.62.) Appendix 3, Oswestry Disability Index category descriptors. Available from: Findjewelers.cz  Minimally Clinically Important Difference (MCID) = 12.8%  COGNITION: Overall cognitive status: Within functional limits for tasks assessed     SENSATION: WFL  MUSCLE LENGTH: Hamstrings: WFL during DLLT   POSTURE: rounded shoulders  PALPATION: Tender to palpation along lumbar paraspinals, origin of gluteal muscles, and some along thoracic paraspinals.  Patient had most pain along lumbar paraspinals and gluteal musculature on the left greater than right side  LUMBAR ROM:   AROM eval  Flexion WNL  Extension Min - tight   Right lateral flexion To knee- tight  Left lateral flexion To knee - tight   Right rotation WNL- tight   Left rotation WNL tight    (Blank rows = not tested)  LOWER EXTREMITY ROM:    Further hip range of motion may be beneficial  LOWER EXTREMITY MMT:    MMT Right eval Left eval  Hip flexion 4 4  Hip extension    Hip abduction 4+ 4+  Hip adduction 5 5  Hip internal rotation    Hip external rotation    Knee flexion 5 5  Knee extension 5 5  Ankle dorsiflexion 5 5  Ankle plantarflexion     Ankle inversion    Ankle eversion     (Blank rows = not tested) Measured in seated position   LUMBAR SPECIAL TESTS:  DLLT: could not sustain past 60 degrees  FUNCTIONAL TESTS:     TREATMENT DATE: 06/18/2024    Manual therapy   STM to Bil thoracic paraspinals x 8 min   Grade 2-3 thoracic CPA mobilizations, 30 sec per segment T4-T12    TE- To improve strength, endurance, mobility, and function of specific targeted muscle groups or improve joint range of motion or improve muscle flexibility  Nustep B UE and LE movement training with MHP on lower back throughout x min L 3-4 - denies pain. X 8 min   SL open book stretch 2 x 10 ea side   Bird dogs 2 x 5 ea LE   Seated row 2 x 10 with BTB   Seated thoracic extension on low back chair x 10   Education provided on contacting MD regarding Celebrex that her daughter has been providing her as she has not been prescribed this and would need further education on risks and benefits of this medication.   PATIENT EDUCATION: Education details: POC Person educated: Patient Education method: Explanation Education comprehension: verbalized understanding   HOME EXERCISE PROGRAM:  Access Code: [email or web address] URL: [email or web address] Date: 06/04/2024 Prepared by: Reyes London  Exercises - Clamshell  - 1 x daily - 7 x weekly - 3 sets - 10 reps - Bird Dog Progression  - 1 x daily - 7 x weekly - 1 sets - 10 reps - Sidelying Thoracic Rotation with Open Book  - 1 x daily - 7 x weekly - 1 sets - 10 reps - 5 sec hold - Seated Shoulder Row with Anchored Resistance  - 3 x weekly - 3 sets - 10 reps - Seated Shoulder Horizontal Abduction with Resistance  - 3 x weekly - 3 sets - 10 reps - Shoulder External Rotation and Scapular Retraction with Resistance  - 3 x weekly - 3 sets - 10 reps    Access Code: [email or web address] URL: [email or web address] Date: 04/30/2024 Prepared by: Lonni Gainer  Exercises - Clamshell   - 1 x daily - 7 x weekly - 3 sets - 10 reps - Bird Dog Progression  - 1 x daily - 7 x weekly - 1 sets - 10 reps - Sidelying Thoracic Rotation with Open Book  - 1 x daily - 7 x weekly - 1 sets - 10 reps - 5 sec hold   ASSESSMENT:  CLINICAL IMPRESSION:  Continued PT POC focused on back pain. Session focused on manual therapy and core strengthening as well as thoracic stretching. Tolerated treatment session well with no increase in pain. Education provided on contacting MD regarding Celebrex that her daughter has been providing her without education on risks and benefits, patient verbalized understanding. Progressed with core and gluteal strength. Pt will continue to benefit from skilled physical therapy intervention to address impairments, improve QOL, and attain therapy goals.     OBJECTIVE IMPAIRMENTS: Abnormal gait.   ACTIVITY LIMITATIONS: carrying, standing, squatting, and locomotion level  PARTICIPATION LIMITATIONS: meal prep, cleaning, shopping, community activity, and occupation  PERSONAL FACTORS: Past/current experiences, Time since onset of injury/illness/exacerbation, and  3+ comorbidities: HTN, HLD, osteoporosis, B12 deficiency are also affecting patient's functional outcome.   REHAB POTENTIAL: Good  CLINICAL DECISION MAKING: Stable/uncomplicated  EVALUATION COMPLEXITY: Low   GOALS: Goals reviewed with patient? Yes  SHORT TERM GOALS: Target date: 05/23/2024   Patient will be independent in home exercise program to improve strength/mobility for better functional independence with ADLs. Baseline: No HEP currently  Goal status: INITIAL   LONG TERM GOALS: Target date: 06/19/2024  Patient will improve modified Oswestry pain disability questionnaire by 10 points or greater in order to indicate improvement in her subjective level of back pain as well as her ability to complete functional tasks without pain limitations Baseline: 26 Goal status: INITIAL  2.  Patient will  improve double leg limb lowering test from 60 degrees to 30 degrees or greater showing good core control throughout in order to indicate improved strength of anterior core muscles for improved lumbar stability Baseline: 60 degrees patient loses control and has some discomfort Goal status: INITIAL  3.  Patient will report ability to stand for greater than 1 hour without limitations of pain over to allow her to return to work-related tasks as well as shopping related tasks with less limitation Baseline: When patient stands for 30 minutes per her report she starts to get intense pain that is only relieved by lying down Goal status: INITIAL  4.  Patient report ability to lift light objects from the floor and set them on an object such as a table in order without increase in back pain to show improved functional capacity as well as improved ability to return to work-related tasks Baseline: Patient reports she cannot lift or carry anything at all  Goal status: INITIAL    PLAN:  PT FREQUENCY: 2x/week  PT DURATION: 8 weeks  PLANNED INTERVENTIONS: 97750- Physical Performance Testing, 97110-Therapeutic exercises, 97530- Therapeutic activity, [id number]- Neuromuscular re-education, 97535- Self Care, [id number]- Manual therapy, [id number]- Gait training, [phone] (1-2 muscles), [id number] (3+ muscles)- Dry Needling, Patient/Family education, Balance training, Stair training, and Moist heat.  PLAN FOR NEXT SESSION:   Progress postural strengthening/awareness Reassess hip mobility following previous session. Standing endurance - Standing side plank and bird dog on wall   Maryanne Finder, PT, DPT Physical Therapist - [facility]  [facility] 06/18/2024, 2:45 PM  "

## 2024-06-24 ENCOUNTER — Ambulatory Visit: Admitting: Physical Therapy

## 2024-06-24 ENCOUNTER — Ambulatory Visit: Admitting: Occupational Therapy

## 2024-06-24 DIAGNOSIS — R262 Difficulty in walking, not elsewhere classified: Secondary | ICD-10-CM

## 2024-06-24 DIAGNOSIS — R41841 Cognitive communication deficit: Secondary | ICD-10-CM | POA: Diagnosis not present

## 2024-06-24 DIAGNOSIS — R278 Other lack of coordination: Secondary | ICD-10-CM

## 2024-06-24 DIAGNOSIS — M6281 Muscle weakness (generalized): Secondary | ICD-10-CM

## 2024-06-24 DIAGNOSIS — M546 Pain in thoracic spine: Secondary | ICD-10-CM

## 2024-06-24 DIAGNOSIS — M5459 Other low back pain: Secondary | ICD-10-CM

## 2024-06-24 NOTE — Therapy (Unsigned)
 " OUTPATIENT PHYSICAL THERAPY THORACOLUMBAR TREATMENT/ PHYSICAL THERAPY PROGRESS NOTE   Dates of reporting period  04/24/24   to   06/25/2024     Patient Name: Ashley Collins MRN: [id number] DOB:09/23/1960, 63 y.o., female Today's Date: 06/25/2024  END OF SESSION:  PT End of Session - 06/24/24 1449     Visit Number 10    Number of Visits 18    Date for Recertification  07/23/24    Progress Note Due on Visit 20    PT Start Time 1450    PT Stop Time 1530    PT Time Calculation (min) 40 min    Equipment Utilized During Treatment Gait belt    Activity Tolerance Patient tolerated treatment well    Behavior During Therapy WFL for tasks assessed/performed               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   Cardiac arrhythmia    Nonspecific ST T wave changes on EKG   Chronic venous insufficiency of lower extremity    Complication of anesthesia    nausea and vomiting   DDD (degenerative disc disease), lumbosacral    Essential hypertension    Headache    History of kidney stones    Hyperlipidemia    Kidney stones    Lymphedema    Migraines    Osteoporosis    PONV (postoperative nausea and vomiting)    Right ureteral stone    Vitamin B12 deficiency    Vitamin D deficiency    Past Surgical History:  Procedure Laterality Date   AUGMENTATION MAMMAPLASTY     CESAREAN SECTION     x 4   COLONOSCOPY WITH PROPOFOL  N/A 03/31/2022   Procedure: COLONOSCOPY WITH PROPOFOL ;  Surgeon: Onita Elspeth Sharper, DO;  Location: [facility];  Service: Gastroenterology;  Laterality: N/A;   CYSTOSCOPY W/ RETROGRADES Bilateral 07/10/2020   Procedure: CYSTOSCOPY WITH RETROGRADE PYELOGRAM;  Surgeon: Francisca Redell BROCKS, MD;  Location: [facility];  Service: Urology;  Laterality: Bilateral;   CYSTOSCOPY/URETEROSCOPY/HOLMIUM LASER/STENT PLACEMENT     CYSTOSCOPY/URETEROSCOPY/HOLMIUM LASER/STENT PLACEMENT Bilateral  07/10/2020   Procedure: CYSTOSCOPY/URETEROSCOPY/HOLMIUM LASER/STENT PLACEMENT;  Surgeon: Francisca Redell BROCKS, MD;  Location: [facility];  Service: Urology;  Laterality: Bilateral;   CYSTOSCOPY/URETEROSCOPY/HOLMIUM LASER/STENT PLACEMENT Right 08/25/2023   Procedure: CYSTOSCOPY/URETEROSCOPY/HOLMIUM LASER;  Surgeon: Francisca Redell BROCKS, MD;  Location: [facility];  Service: Urology;  Laterality: Right;   CYSTOSCOPY/URETEROSCOPY/HOLMIUM LASER/STENT PLACEMENT Right 12/15/2023   Procedure: CYSTOSCOPY/URETEROSCOPY/HOLMIUM LASER;  Surgeon: Francisca Redell BROCKS, MD;  Location: [facility];  Service: Urology;  Laterality: Right;   EXTRACORPOREAL SHOCK WAVE LITHOTRIPSY     x 10 plus   Eye Lift     FACIAL COSMETIC SURGERY     GANGLION CYST EXCISION Right 12/21/2021   Procedure: REMOVAL GANGLION OF WRIST;  Surgeon: Kathlynn Sharper, MD;  Location: [facility];  Service: Orthopedics;  Laterality: Right;   LIPOSUCTION     TONSILLECTOMY     TRIGGER FINGER RELEASE Right 12/21/2021   Procedure: RELEASE TRIGGER FINGER/A-1 PULLEY;  Surgeon: Kathlynn Sharper, MD;  Location: [facility];  Service: Orthopedics;  Laterality: Right;   URETEROSCOPY WITH HOLMIUM LASER LITHOTRIPSY     Patient Active Problem List   Diagnosis Date Noted   Acute CVA (cerebrovascular accident) (HCC) 12/19/2023   Hypokalemia 12/19/2023   AKI (acute kidney injury) 12/19/2023   Leukocytosis 12/19/2023   Hyperlipidemia, unspecified 12/19/2023  Essential hypertension 12/19/2023   Vision changes 12/19/2023   Lymphedema 05/22/2023   Chronic venous insufficiency 05/22/2023   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 replacement therapy 04/10/2023   Insomnia due to medical condition 04/25/2022   GAD (generalized anxiety disorder) 02/02/2022   Other specified depressive episodes 02/02/2022   Long-term current use of benzodiazepine 02/02/2022   Migraine with aura and without status migrainosus, not intractable 11/03/2020   Arrhythmia 08/06/2020   Status migrainosus 02/25/2019    Osteoporosis 04/09/2013   Vitamin D deficiency 04/09/2013   DDD (degenerative disc disease), lumbosacral 02/08/2005    PCP: Harvey Gaetana CROME, NP   REFERRING PROVIDER: Meeler, Benton CROME, FNP   REFERRING DIAG: M54.16 (ICD-10-CM) - Lumbar radiculitis   Rationale for Evaluation and Treatment: Rehabilitation  THERAPY DIAG:  Muscle weakness (generalized)  Other lack of coordination  Pain in thoracic spine  Other low back pain  Difficulty in walking, not elsewhere classified  ONSET DATE: 1.5 months   SUBJECTIVE:                                                                                                                                                                                           SUBJECTIVE STATEMENT:   From today Pt arrives reporting that she is doing well. Had a great Christmas. Was able to go [place] [employer], but reports that she feels extra fatigue after the over stimulation of Christmas and the holiday events. No Pain reported by pt on this day.   Pt also states that she has been taking an anti-inflammatory for the last few weeks and it has mostly removed pain. Is hoping to speak with MD to change medication to this rather than muscle relaxer.  - pt Verbalizes that she is taking this medication without current Rx.   Would like to continue PT until after she returns to work to ensure that prolonged standing with work requirements does not exacerbate back pain.   From Eval:  Pt here to work on pain and discomfort in her mid back. Pt reports the more she is on her feet the more her back hurts and relief comes from being supine. Pt has not had any previous therapy for her back pain. Pt has epidural that assists with her pain below her belt line. Pt still in OT working on her upper extremity and grip strength. Pt has epidural shots for last 3 years. Pt initially hurt her back lifting something to put in her car. Patient can stand for maybe 30 min at a  time before pain is no longer tolerable. She reports that sitting down does not improve her pain  but lying on her side in would bring relief. Pt has history of osteoporosis and sliped disc in her back. Pt pain in this area is similar to pain she gets epidural for but just further superior into her thoracic spine.    PERTINENT HISTORY:  Patient familiar to this clinic before.  Was previously seen following CVA for mobility and balance deficits.  Still seeing speech and language pathologist and occupational therapy.  PAIN:  Are you having pain? Yes: NPRS scale: 3-10 ( at end of day everyday) Pain location: above belt line and radiating laterally  Pain description: nagging, throbbing pain  Aggravating factors: Standing Relieving factors: Lying down  PRECAUTIONS: None  RED FLAGS: None   WEIGHT BEARING RESTRICTIONS: No  FALLS:  Has patient fallen in last 6 months? No  LIVING ENVIRONMENT: Lives with: lives with SO Lives in: House/apartment Stairs: No Has following equipment at home: None  OCCUPATION: Merchandiser, Retail   PLOF: Independent and Independent with basic ADLs  PATIENT GOALS: improve back pain   NEXT MD VISIT: Not scheduled   OBJECTIVE:   Note: Objective measures were completed at Evaluation unless otherwise noted.  DIAGNOSTIC FINDINGS:  No recent images since to 2023 were mild degenerative changes in the lumbar spine were noted  PATIENT SURVEYS:  Modified Oswestry:  MODIFIED OSWESTRY DISABILITY SCALE  Date: 04/25/24 Score  Pain intensity 5 =  Pain medication has no effect on my pain.  2. Personal care (washing, dressing, etc.) 0 =  I can take care of myself normally without causing increased pain.  3. Lifting 5 =  I cannot lift or carry anything at all.  4. Walking 3 =  Pain prevents me from walking more than  mile.  5. Sitting 3 =  Pain prevents me from sitting more than  hour.  6. Standing 2 =  Pain prevents me from standing more than 1 hour  7. Sleeping 1  = I can sleep well only by using pain medication.  8. Social Life 2 = Pain prevents me from participating in more energetic activities (eg. sports, dancing).  9. Traveling 2 =  My pain restricts my travel over 2 hours.  10. Employment/ Homemaking 3 = Pain prevents me from doing anything but light duties.  Total 26/50   Interpretation of scores: Score Category Description  0-20% Minimal Disability The patient can cope with most living activities. Usually no treatment is indicated apart from advice on lifting, sitting and exercise  21-40% Moderate Disability The patient experiences more pain and difficulty with sitting, lifting and standing. Travel and social life are more difficult and they may be disabled from work. Personal care, sexual activity and sleeping are not grossly affected, and the patient can usually be managed by conservative means  41-60% Severe Disability Pain remains the main problem in this group, but activities of daily living are affected. These patients require a detailed investigation  61-80% Crippled Back pain impinges on all aspects of the patients life. Positive intervention is required  81-100% Bed-bound These patients are either bed-bound or exaggerating their symptoms  Bluford FORBES Zoe DELENA Karon DELENA, et al. Surgery versus conservative management of stable thoracolumbar fracture: the PRESTO feasibility RCT. [place] ([place]): [employer]; 2021 Nov. [facility], [place] Technology Assessment, No. 25.62.) Appendix 3, Oswestry Disability Index category descriptors. Available from: Findjewelers.cz  Minimally Clinically Important Difference (MCID) = 12.8%  COGNITION: Overall cognitive status: Within functional limits for tasks assessed     SENSATION: WFL  MUSCLE LENGTH:  Hamstrings: WFL during DLLT   POSTURE: rounded shoulders  PALPATION: Tender to palpation along lumbar paraspinals, origin of gluteal muscles, and some along thoracic  paraspinals.  Patient had most pain along lumbar paraspinals and gluteal musculature on the left greater than right side  LUMBAR ROM:   AROM eval  Flexion WNL  Extension Min - tight   Right lateral flexion To knee- tight  Left lateral flexion To knee - tight   Right rotation WNL- tight   Left rotation WNL tight    (Blank rows = not tested)  LOWER EXTREMITY ROM:    Further hip range of motion may be beneficial  LOWER EXTREMITY MMT:    MMT Right eval Left eval  Hip flexion 4 4  Hip extension    Hip abduction 4+ 4+  Hip adduction 5 5  Hip internal rotation    Hip external rotation    Knee flexion 5 5  Knee extension 5 5  Ankle dorsiflexion 5 5  Ankle plantarflexion    Ankle inversion    Ankle eversion     (Blank rows = not tested) Measured in seated position   LUMBAR SPECIAL TESTS:  DLLT: could not sustain past 60 degrees  FUNCTIONAL TESTS:     TREATMENT DATE: 06/25/2024     Pt assessed Pt LTG for mid back pain.  See goals section for details.   Continued Education provided on contacting MD regarding Celebrex that her daughter has been providing her as she has not been prescribed this and would need further education on risks and benefits of this medication.   Educated pt on limitations and benefits of continued PT given progress and reduced pain over the last week. Pt continues to report challenges with prolonged standing and lifting objects > 5 lbs, which may be a requirement for return to work.   TE- To improve strength, endurance, mobility, and function of specific targeted muscle groups or improve joint range of motion or improve muscle flexibility  Bridge 2x 8 with 3 sec bil.   Open book x 8 bil with 3-5 sec hold   Seated thoracic extension 2x 12     PATIENT EDUCATION: Education details: POC; need to speak to MD about medication adjustments. Limitations and benefits of PT with return to work.  Person educated: Patient Education method:  Explanation Education comprehension: verbalized understanding   HOME EXERCISE PROGRAM:  Access Code: [email or web address] URL: [email or web address] Date: 06/04/2024 Prepared by: Reyes London  Exercises - Clamshell  - 1 x daily - 7 x weekly - 3 sets - 10 reps - Bird Dog Progression  - 1 x daily - 7 x weekly - 1 sets - 10 reps - Sidelying Thoracic Rotation with Open Book  - 1 x daily - 7 x weekly - 1 sets - 10 reps - 5 sec hold - Seated Shoulder Row with Anchored Resistance  - 3 x weekly - 3 sets - 10 reps - Seated Shoulder Horizontal Abduction with Resistance  - 3 x weekly - 3 sets - 10 reps - Shoulder External Rotation and Scapular Retraction with Resistance  - 3 x weekly - 3 sets - 10 reps    Access Code: [email or web address] URL: [email or web address] Date: 04/30/2024 Prepared by: Lonni Gainer  Exercises - Clamshell  - 1 x daily - 7 x weekly - 3 sets - 10 reps - Bird Dog Progression  - 1 x daily - 7 x weekly - 1 sets - 10 reps - Sidelying Thoracic Rotation with Open  Book  - 1 x daily - 7 x weekly - 1 sets - 10 reps - 5 sec hold   ASSESSMENT:  CLINICAL IMPRESSION:  Continued PT POC focused on back pain. LTG assessed. Pt has met 4 of 4 LTG indicating improved function for home bound tasks. Pt reports that she will be returning to work in mid January and is concerned that the demands for retail job will exacerbate pain s/s.  Goals revised accordingly. Session focused on core strengthening as well as thoracic stretching. Tolerated treatment session well with no increase in pain. Continued to Educate on contacting MD regarding Celebrex that her daughter has been providing her without education on risks and benefits, patient verbalized understanding.  Patient's condition has the potential to improve in response to therapy. Maximum improvement is yet to be obtained. The anticipated improvement is attainable and reasonable in a generally predictable time.  Pt will  continue to benefit from skilled physical therapy intervention to address impairments, improve QOL, and attain therapy goals.     OBJECTIVE IMPAIRMENTS: Abnormal gait.   ACTIVITY LIMITATIONS: carrying, standing, squatting, and locomotion level  PARTICIPATION LIMITATIONS: meal prep, cleaning, shopping, community activity, and occupation  PERSONAL FACTORS: Past/current experiences, Time since onset of injury/illness/exacerbation, and 3+ comorbidities: HTN, HLD, osteoporosis, B12 deficiency are also affecting patient's functional outcome.   REHAB POTENTIAL: Good  CLINICAL DECISION MAKING: Stable/uncomplicated  EVALUATION COMPLEXITY: Low   GOALS: Goals reviewed with patient? Yes  SHORT TERM GOALS: Target date: 07/16/2024     Patient will be independent in home exercise program to improve strength/mobility for better functional independence with ADLs. Baseline:Provided  Goal status: progressing    LONG TERM GOALS: Target date: 07/23/2024    Patient will improve modified Oswestry pain disability questionnaire  to less than 10 points in order to indicate improvement in her subjective level of back pain as well as her ability to complete functional tasks without pain limitations Baseline: 26/50 12/29: 12/50 (met to decrease by 10pts) Goal status: MET/ revised   2.  Patient will improve double leg limb lowering test from 60 degrees to 30 degrees or greater showing good core control throughout in order to indicate improved strength of anterior core muscles for improved lumbar stability Baseline: 60 degrees patient loses control and has some discomfort 12/29: able to perform in control without pain Goal status: MET  3.  Patient will report ability to stand for greater than 4 hour without limitations of pain over to allow her to return to work-related tasks with less limitation Baseline: When patient stands for 30 minutes per her report she starts to get intense pain that is only  relieved by lying down 12/29: pt states that she can tolerate up to 2 hours without needing a break.  (Met for >1 hour) Goal status: MET/revised   4.  Patient report ability to lift medium objects(10-15lbs) from the floor and set them on an object such as a table in order without increase in back pain to show improved functional capacity as well as improved ability to return to work-related tasks Baseline: Patient reports she cannot lift or carry anything at all  12/29: pt reports no pain picking tennis ball up floor without pain. Was able to pick up remote from under bed without pain yesterday. But movement was a little challenging (met for light objects)  Goal status: MET/ revised      PLAN:  PT FREQUENCY: 1-2x/week  PT DURATION: 4 weeks  PLANNED INTERVENTIONS: 97750- Physical Performance  Testing, 97110-Therapeutic exercises, 97530- Therapeutic activity, [id number]- Neuromuscular re-education, [id number]- Self Care, [id number]- Manual therapy, [id number]- Gait training, [phone] (1-2 muscles), [id number] (3+ muscles)- Dry Needling, Patient/Family education, Balance training, Stair training, and Moist heat.  PLAN FOR NEXT SESSION:   Progress postural strengthening/awareness Reassess hip mobility following previous session. Standing endurance - Standing side plank and bird dog on wall   Educate on PT frequency adjustments given recent progress. (1x /week, or hold until return to work)     Massie Dollar PT, DPT  Physical Therapist - [place]  [facility]  8:35 AM 06/25/2024    "

## 2024-06-24 NOTE — Therapy (Signed)
 "   Occupational Therapy Treatment Note   Patient Name: Ashley Collins MRN: [id number] DOB:10/06/1960, 63 y.o., female Today's Date: 06/24/2024  PCP: Harvey Gaetana CROME, NP REFERRING PROVIDER: Harvey Gaetana CROME, NP   OT End of Session - 06/24/24 1423     Visit Number 51    Number of Visits 72    Date for Recertification  08/06/24    OT Start Time 1405    OT Stop Time 1445    OT Time Calculation (min) 40 min    Activity Tolerance Patient tolerated treatment well    Behavior During Therapy WFL for tasks assessed/performed               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   Cardiac arrhythmia    Nonspecific ST T wave changes on EKG   Chronic venous insufficiency of lower extremity    Complication of anesthesia    nausea and vomiting   DDD (degenerative disc disease), lumbosacral    Essential hypertension    Headache    History of kidney stones    Hyperlipidemia    Kidney stones    Lymphedema    Migraines    Osteoporosis    PONV (postoperative nausea and vomiting)    Right ureteral stone    Vitamin B12 deficiency    Vitamin D deficiency    Past Surgical History:  Procedure Laterality Date   AUGMENTATION MAMMAPLASTY     CESAREAN SECTION     x 4   COLONOSCOPY WITH PROPOFOL  N/A 03/31/2022   Procedure: COLONOSCOPY WITH PROPOFOL ;  Surgeon: Onita Elspeth Sharper, DO;  Location: [facility];  Service: Gastroenterology;  Laterality: N/A;   CYSTOSCOPY W/ RETROGRADES Bilateral 07/10/2020   Procedure: CYSTOSCOPY WITH RETROGRADE PYELOGRAM;  Surgeon: Francisca Redell BROCKS, MD;  Location: [facility];  Service: Urology;  Laterality: Bilateral;   CYSTOSCOPY/URETEROSCOPY/HOLMIUM LASER/STENT PLACEMENT     CYSTOSCOPY/URETEROSCOPY/HOLMIUM LASER/STENT PLACEMENT Bilateral 07/10/2020   Procedure: CYSTOSCOPY/URETEROSCOPY/HOLMIUM LASER/STENT PLACEMENT;  Surgeon: Francisca Redell BROCKS, MD;  Location: [facility];  Service:  Urology;  Laterality: Bilateral;   CYSTOSCOPY/URETEROSCOPY/HOLMIUM LASER/STENT PLACEMENT Right 08/25/2023   Procedure: CYSTOSCOPY/URETEROSCOPY/HOLMIUM LASER;  Surgeon: Francisca Redell BROCKS, MD;  Location: [facility];  Service: Urology;  Laterality: Right;   CYSTOSCOPY/URETEROSCOPY/HOLMIUM LASER/STENT PLACEMENT Right 12/15/2023   Procedure: CYSTOSCOPY/URETEROSCOPY/HOLMIUM LASER;  Surgeon: Francisca Redell BROCKS, MD;  Location: [facility];  Service: Urology;  Laterality: Right;   EXTRACORPOREAL SHOCK WAVE LITHOTRIPSY     x 10 plus   Eye Lift     FACIAL COSMETIC SURGERY     GANGLION CYST EXCISION Right 12/21/2021   Procedure: REMOVAL GANGLION OF WRIST;  Surgeon: Kathlynn Sharper, MD;  Location: [facility];  Service: Orthopedics;  Laterality: Right;   LIPOSUCTION     TONSILLECTOMY     TRIGGER FINGER RELEASE Right 12/21/2021   Procedure: RELEASE TRIGGER FINGER/A-1 PULLEY;  Surgeon: Kathlynn Sharper, MD;  Location: [facility];  Service: Orthopedics;  Laterality: Right;   URETEROSCOPY WITH HOLMIUM LASER LITHOTRIPSY     Patient Active Problem List   Diagnosis Date Noted   Acute CVA (cerebrovascular accident) (HCC) 12/19/2023   Hypokalemia 12/19/2023   AKI (acute kidney injury) 12/19/2023   Leukocytosis 12/19/2023   Hyperlipidemia, unspecified 12/19/2023   Essential hypertension 12/19/2023   Vision changes 12/19/2023   Lymphedema 05/22/2023   Chronic venous insufficiency 05/22/2023   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 replacement therapy 04/10/2023  Insomnia due to medical condition 04/25/2022   GAD (generalized anxiety disorder) 02/02/2022   Other specified depressive episodes 02/02/2022   Long-term current use of benzodiazepine 02/02/2022   Migraine with aura and without status migrainosus, not intractable 11/03/2020   Arrhythmia 08/06/2020   Status migrainosus 02/25/2019   Osteoporosis 04/09/2013   Vitamin D deficiency 04/09/2013   DDD (degenerative disc disease), lumbosacral 02/08/2005   ONSET DATE:  12/18/2023  REFERRING DIAG:   THERAPY DIAG:  Muscle weakness (generalized)  Other lack of coordination  Rationale for Evaluation and Treatment: Rehabilitation  SUBJECTIVE:  SUBJECTIVE STATEMENT:   Pt. reports having had a nice Christmas, and visiting outside with family  2/2 a family member having COVID over the holiday.  Pt accompanied by: significant other  PERTINENT HISTORY: Pt. has Hx of stroke with onset 12/18/2023. Pt. PMHx includes: HTN, Hyperlipidemia, Kidney Stones, near syncopal event, loss of vision.  PRECAUTIONS: None  WEIGHT BEARING RESTRICTIONS: None  PAIN:  Are you having pain? No  FALLS: Has patient fallen in last 6 months? Yes. Number of falls    LIVING ENVIRONMENT: Lives with: lives with their family Lives in: [place], [place] Stairs: No, inside the house, yes but does not use Has following equipment at home:   PLOF: Independent  PATIENT GOALS: To be able to see  OBJECTIVE:   HAND DOMINANCE: Right  ADLs:  Eating: Drinking from straw is different, able to use utensils.  Grooming: Fatigues completing hair care.  UB Dressing: independent, if clothes are in front of her she can find clothing LB Dressing: Independent Toileting: Independent Bathing: Independent Tub Shower transfers: Independent Equipment: none Has difficulty with using cell phone  IADLs: Shopping: Does not typically go out shopping, and has not tried to. Light housekeeping: Pt. reports that she does not typically do house cleaning. Pt. has been able to unpack belongings from boxes due to her recent move in. Meal Prep: Pt. reports that is does not currently cook, however reports that she probably could. Has difficulty opening bottle caps/lids. Community mobility: Requires assistance from husband to navigate  through buildings, negotiate stairs-Pt. with increased fear of falling  Medication management: Able to push down medication bottles to open  Financial management: No change in the  process-uses automatic bill pay system.  Has difficulty with using cell phone Handwriting: N/T Work: Pt. was actively working managing a health visitor, works a lot of hours  MOBILITY STATUS: Independent and Needs Assist: Requires hand on hand assistance with nvigating through environments.   POSTURE COMMENTS:  No Significant postural limitations Sitting balance: Good  ACTIVITY TOLERANCE: Activity tolerance: Good  FUNCTIONAL OUTCOME MEASURES:   UPPER EXTREMITY ROM:    Active ROM Right Eval WFL Left Eval [facility]  Shoulder flexion    Shoulder abduction    Shoulder adduction    Shoulder extension    Shoulder internal rotation    Shoulder external rotation    Elbow flexion    Elbow extension    Wrist flexion    Wrist extension    Wrist ulnar deviation    Wrist radial deviation    Wrist pronation    Wrist supination    (Blank rows = not tested)  UPPER EXTREMITY MMT:     MMT Right eval Right 01/29/24 Right  04/04/24 Left eval Left 01/29/24 Left 04/04/24  Shoulder flexion 4-/5 5/5 5/5 4-/5 5/5 5/5  Shoulder abduction 4-/5 5/5 5/5 4-/5 5/5 5/5  Shoulder adduction        Shoulder extension  Shoulder internal rotation        Shoulder external rotation        Middle trapezius        Lower trapezius        Elbow flexion 5/5 5/5 5/5 5/5 5/5 5/5  Elbow extension 5/5 5/5 5/5 5/5 5/5 5/5  Wrist flexion 4/5 5/5 5/5 4-/5 5/5 5/5  Wrist extension 4/5 5/5 5/5 4-/5 5/5 5/5  Wrist ulnar deviation        Wrist radial deviation        Wrist pronation        Wrist supination        (Blank rows = not tested)  HAND FUNCTION: Grip strength: Right: 39 lbs; Left: 28 lbs, Lateral pinch: Right: 11 lbs, Left: 9 lbs, and 3 point pinch: Right: 9 lbs, Left: 9 lbs  01/29/24 Grip strength: Right: 39 lbs; Left: 36 lbs, Lateral pinch: Right: 14 lbs, Left: 12 lbs, and 3 point pinch: Right: 12 lbs, Left: 11 lbs  04/04/24:  Grip strength: Right: 37 lbs; Left: 42  lbs  05/14/24  Grip strength: Right: 55 lbs; Left: 47 lbs  06/18/24  Grip strength: Right: 57 lbs; Left: 47 lbs Lateral pinch: Right: 15 lbs, Left: 12 lbs, and 3 point pinch: Right: 14 lbs, Left: 11 lbs  COORDINATION:  9 Hole Peg test: Right: 30 sec; Left: 36 sec  01/29/24 9 Hole Peg test: Right: 24 sec; Left: 23 sec.  04/04/24:  9 Hole Peg test: Right: 28 sec; Left: 33 sec.  05/14/24:  9 Hole Peg test: Right: 23 sec; Left: 22 sec.  SENSATION: WFL  EDEMA:   MUSCLE TONE:   COGNITION: Overall cognitive status: Within functional limits for tasks assessed  VISION: Subjective report: Pt. reports changes in her vision at onset of stroke, starting with blurry vision, resulting in vision loss in the R eye.  Baseline vision: Pt. Is able to visually track  Visual history: Hx of eye surgery  VISION ASSESSMENT: TBD  PERCEPTION: WFL  PRAXIS: WFL  OBSERVATIONS:                                                                                                                    TREATMENT DATE: 06/24/24    Therapeutic Activities:   -Facilitated visual perceptual skills using small Parquetry block design patterns working to connect multiple shapes out of smaller shape pieces in preparation for placing them onto the design pattern board.  PATIENT EDUCATION:  Compensatory strategies for visual perceptual functioning  HOME EXERCISE PROGRAM:  -Upgraded to blue theraputty for gross grip strengthening. -Green Theraputty exercises for  hand strengthening at bridge -Visual perceptual: Flip flop target design patterns. -Visual memory tasks -Writing tasks -visual motor tasks.  GOALS: Goals reviewed with patient? Yes  SHORT TERM GOALS: Target date: 06/25/2024   Pt. Will independently utilize HEP for hand strength, coordination, and visual compensatory strategies ADL/ADLs Baseline:06/18/24: Independent 05/14/24: Independent 04/12/23: Independent, continue 03/06/24: Independent  Eval: No  current HEP  Goal status: Ongoing   LONG TERM GOALS: Target date: 08/06/2024  Pt. Will be able to independently implement visual scanning/visual search compensatory strategies for tasks within her extra personal space navigating through community environments 100% of the time.  Baseline: 01/29/24:  Independent  100% of the time within the therapy gym, and hallway. Eval: Pt. Requires increased assist to navigate through community environments. Goal status: Achieved  2.  Pt. Will be able to independently utilize visual scanning/visual search strategies  during ADLs/IADLs within her near space, and during tabletop tasks, and tasks at a vertical plane with 100% accuracy. Baseline: 06/18/24: 100% simple to complex visual scanning tasks however 75% accuracy for  moderate tocomplex visual scanning tasks at a horizontal plane tas, and scanning at a vertical plane. Pt. Pt. Is able to visually scan for multiple different items at once with 60% accuracy. 07/15/23: 90% simple to moderately complex visual scanning tasks however 75% accuracy for complex visual scanning tasks , and scanning at a vertical plane. 04/11/24: 80% simple to moderately complex visual scanning tasks however 70% accuracy for complex visual scanning tasks, and scanning at a vertical plane.  03/06/24: Initiates visual scanning 85% of the time  01/29/24: 75% Eval: Pt. Education to be provided.  Goal status: Progressing Ongoing  3.  Pt. Will be able to independently initiate visual scanning techniques in her own environment to reduce risk of falls.  Baseline: 01/29/24: Education was provided, Pt. is utlizing visual compensatory strategies/visual scanning within her home. Eval: Pt. Education to be provided.  Goal status: Achieved  4.  Pt. Will increase BUE Grip Strength by 5# to be able to independently and securely hold objects for ADL/IADL use. Baseline: 05/14/24: Grip strength: Right: 55 lbs; Left: 47 lbs Pt. Has improved with holding  items within her hand without dropping them.04/11/24: Continue- recent revision 04/04/2024: Grip strength: Right: 37 lbs; Left: 42 lbs patient is dropping items from her hands 01/29/24: Grip strength: Right: 39 lbs; Left: 28 lbs, Eval: Right Grip: 39#, Left Grip: 28# Goal status: Achieved  5.  Pt. Will increase L Lateral Pinch strength by 2# to be able to open bottles. Baseline: 06/18/24: Lateral pinch: Right: 15 lbs, Left: 12 lbs, and 3 point pinch: Right: 14 lbs, Left: 11 lbs 05/14/24: Pt. is able to open previously opened jars/bottles. Has difficulty with unopened jars/bottles.04/04/2024: Grip strength: Right: 37 lbs; Left: 42 lbs 01/29/24: Lateral pinch: Right: 14 lbs, Left: 12 lbs Eval: Lateral pinch: Right: 11 lbs, Left: 9 lbs  Goal status: Achieved  6.  Pt. Will increase BUE strength for shoulder flexion/abduction by 2 mm grades to independently complete hair care tasks. Baseline:  03/16/2024: 5/5 01/29/24: 5/5 overall Eval: Right Shoulder Flexion: 4-/5, Left Shoulder Flexion:4-/5, Right Shoulder Abduction: 4-/5, Left Shoulder Abduction: 4-/5 Goal status: Achieved  7.  Pt will be able to independently and efficiently manipulate medication without dropping them.  Baseline: 06/18/24: Pt. Is able to manipulate her medication consistently now without dropping them.05/14/24: 9 Hole Peg test: Right: 23 sec; Left: 22 sec. 04/11/24: Continue, recent revision 04/04/2024: 9 Hole Peg test: Right: 28 sec; Left: 33 sec. 01/29/24: Independent 01/15/24: Daughter currently manages medication set up.  Goal status: Ongoing  8. Pt will increase typing speed to 15 words per minute with at least 90% accuracy to work towards more efficient typing for job related  responsibilities.  Baseline: 06/18/24: 10 wpm with 87% accuracy with 8 typos for 4 sentences.05/14/24: Pt. Continues to present with multiple mistypes, and requires increased  time.05/14/24: 04/11/24: TBD 03/06/24: 10 wpm with 92% accuracy. 01/29/24: Continue  01/15/24: 8wpm x88% accuracy=7 wpm  Goal status: Ongoing/Modified to 15 wpm 06/18/24  9. Pt will be able to scan small print on store receipts to check for errors with 100% accuracy using visual compensation strategies as needed.  Baseline: 06/18/24: Independent and efficient with scanning receipts accurately 05/14/24: Pt. Is improving with efficiency of visually scanning receipts.04/11/24: Pt. Continues to present with difficulty with visual scanning small  small items efficiently. 03/06/24: Pt. Continues to present with difficulty with visual scanning small  small items efficiently. 01/29/24: Pt. Continues to have difficulty scanning small print items. 01/15/24: Not yet attempted; required for return to work/job responsibility  Goal status:  Achieved  10. Pt. will independently identify 8 items consistently from visual visual memory in preparation or ADLs.    Baseline: 06/18/24: 5-7 items depending upon the context of the task, less with with increased environmental stimulation, and distractions. 05/14/24: 5-7 items.04/11/24: 5-7 items  while seated at the tabletop. 03/06/24: Pt. is able to identify up to 6 picture items while seated at the tabletop. Pt. is able to consistently Identify 4 letters from visual memory when attention is divided.  01/29/24: Pt. is able to identify 6 items consistently from visual memory.    Gaol status: Ongoing      11. Pt. will be independently write 4 sentences  efficiently with 100% legibility, no deviation from writing on a blank line, and appropriate spacing between letters.              Baseline: 06/18/24: 4 sentences completed in printed form in 2 min. & 35 sec. With positive deviation above the line.05/14/24: 4 lines with 80% legibility in printed form, completed in 2 min. & 16 sec. 04/11/24: Continue 03/06/24: 4 lines with 75% legibility, positive deviation below the line, and excessive spacing between the words.              Goal status:  Ongoing  ASSESSMENT:  CLINICAL IMPRESSION:  Pt. reports that she has been tired after having walked the most that she has at the [facility] in [place]. Pt. was able to complete simple Parquetry design patterns, after cues initially. Pt. Required increased cues, and presented with difficulty completing moderately complex Parquetry design patterns. Pt. continues to benefit from Occupational Therapy services to improve her ability to use visual compensatory strategies, and improve overall BUE functioning in order to improve engagement in, and maximize overall independence with ADL, and IADL tasks.        PERFORMANCE DEFICITS: in functional skills including ADLs, IADLs, coordination, dexterity, Fine motor control, and vision, and psychosocial skills including coping strategies, environmental adaptation, habits, interpersonal interactions, and routines and behaviors.   IMPAIRMENTS: are limiting patient from ADLs, IADLs, rest and sleep, work, leisure, and social participation.   CO-MORBIDITIES: may have co-morbidities  that affects occupational performance. Patient will benefit from skilled OT to address above impairments and improve overall function.  MODIFICATION OR ASSISTANCE TO COMPLETE EVALUATION: Min-Moderate modification of tasks or assist with assess necessary to complete an evaluation.  OT OCCUPATIONAL PROFILE AND HISTORY: Detailed assessment: Review of records and additional review of physical, cognitive, psychosocial history related to current functional performance.  CLINICAL DECISION MAKING: Moderate - several treatment options, min-mod task modification necessary  REHAB POTENTIAL: Good  EVALUATION COMPLEXITY: Moderate    PLAN:  OT FREQUENCY: 2x/week  OT DURATION: 12 weeks  PLANNED INTERVENTIONS: 02831 OT Re-evaluation, 97535 self care/ADL training, 02889 therapeutic  exercise, 97530 therapeutic activity, 97112 neuromuscular re-education, visual/perceptual  remediation/compensation, patient/family education, and DME and/or AE instructions  RECOMMENDED OTHER SERVICES: ST  CONSULTED AND AGREED WITH PLAN OF CARE: Patient and family member/caregiver  PLAN FOR NEXT SESSION: Treatment  Richardson Otter, MS, OTR/L  06/24/2024, 2:34 PM    "

## 2024-06-25 ENCOUNTER — Ambulatory Visit: Admitting: Physical Therapy

## 2024-06-25 ENCOUNTER — Ambulatory Visit

## 2024-06-25 ENCOUNTER — Ambulatory Visit: Admitting: Occupational Therapy

## 2024-06-25 ENCOUNTER — Encounter: Payer: Self-pay | Admitting: Occupational Therapy

## 2024-06-25 DIAGNOSIS — M6281 Muscle weakness (generalized): Secondary | ICD-10-CM

## 2024-06-25 DIAGNOSIS — R41841 Cognitive communication deficit: Secondary | ICD-10-CM

## 2024-06-25 DIAGNOSIS — M546 Pain in thoracic spine: Secondary | ICD-10-CM

## 2024-06-25 DIAGNOSIS — R262 Difficulty in walking, not elsewhere classified: Secondary | ICD-10-CM

## 2024-06-25 DIAGNOSIS — M5459 Other low back pain: Secondary | ICD-10-CM

## 2024-06-25 NOTE — Therapy (Signed)
 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   Cardiac arrhythmia    Nonspecific ST T wave changes on EKG   Chronic venous insufficiency of lower extremity    Complication of anesthesia    nausea and vomiting   DDD (degenerative disc disease), lumbosacral    Essential hypertension    Headache    History of kidney stones    Hyperlipidemia    Kidney stones    Lymphedema    Migraines    Osteoporosis    PONV (postoperative nausea and vomiting)    Right ureteral stone    Vitamin B12 deficiency    Vitamin D deficiency    Past Surgical History:  Procedure Laterality Date   AUGMENTATION MAMMAPLASTY     CESAREAN SECTION     x 4   COLONOSCOPY WITH PROPOFOL  N/A 03/31/2022   Procedure: COLONOSCOPY WITH PROPOFOL ;  Surgeon: Onita Elspeth Sharper, DO;  Location: [facility] ENDOSCOPY;  Service: Gastroenterology;  Laterality: N/A;   CYSTOSCOPY W/ RETROGRADES Bilateral 07/10/2020   Procedure: CYSTOSCOPY WITH RETROGRADE PYELOGRAM;  Surgeon: Francisca Redell BROCKS, MD;  Location: [facility] ORS;  Service: Urology;  Laterality: Bilateral;   CYSTOSCOPY/URETEROSCOPY/HOLMIUM LASER/STENT PLACEMENT     CYSTOSCOPY/URETEROSCOPY/HOLMIUM LASER/STENT PLACEMENT Bilateral 07/10/2020   Procedure: CYSTOSCOPY/URETEROSCOPY/HOLMIUM LASER/STENT PLACEMENT;  Surgeon: Francisca Redell BROCKS, MD;  Location: [facility] ORS;  Service: Urology;  Laterality: Bilateral;    CYSTOSCOPY/URETEROSCOPY/HOLMIUM LASER/STENT PLACEMENT Right 08/25/2023   Procedure: CYSTOSCOPY/URETEROSCOPY/HOLMIUM LASER;  Surgeon: Francisca Redell BROCKS, MD;  Location: [facility] OR[facility];  Service: Urology;  Laterality: Right;   CYSTOSCOPY/URETEROSCOPY/HOLMIUM LASER/STENT PLACEMENT Right 12/15/2023   Procedure: CYSTOSCOPY/URETEROSCOPY/HOLMIUM LASER;  Surgeon: Francisca Redell BROCKS, MD;  Location: [facility] OR[facility];  Service: Urology;  Laterality: Right;   EXTRACORPOREAL SHOCK WAVE LITHOTRIPSY     x 10 plus   Eye Lift     FACIAL COSMETIC SURGERY     GANGLION CYST EXCISION Right 12/21/2021   Procedure: REMOVAL GANGLION OF WRIST;  Surgeon: Kathlynn Sharper, MD;  Location: [facility] OR[facility];  Service: Orthopedics;  Laterality: Right;   LIPOSUCTION     TONSILLECTOMY     TRIGGER FINGER RELEASE Right 12/21/2021   Procedure: RELEASE TRIGGER FINGER/A-1 PULLEY;  Surgeon: Kathlynn Sharper, MD;  Location: [facility] ORS;  Service: Orthopedics;  Laterality: Right;   URETEROSCOPY WITH HOLMIUM LASER LITHOTRIPSY     Patient Active Problem List   Diagnosis Date Noted   Acute CVA (cerebrovascular accident) (HCC) 12/19/2023   Hypokalemia 12/19/2023   AKI (acute kidney injury) 12/19/2023   Leukocytosis 12/19/2023   Hyperlipidemia, unspecified 12/19/2023   Essential hypertension 12/19/2023   Vision changes 12/19/2023   Lymphedema 05/22/2023   Chronic venous insufficiency 05/22/2023   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 replacement therapy 04/10/2023   Insomnia due to medical condition 04/25/2022  GAD (generalized anxiety disorder) 02/02/2022   Other specified depressive episodes 02/02/2022   Long-term current use of benzodiazepine 02/02/2022   Migraine with aura and without status migrainosus, not intractable 11/03/2020   Arrhythmia 08/06/2020   Status migrainosus 02/25/2019   Osteoporosis 04/09/2013   Vitamin D deficiency 04/09/2013   DDD (degenerative disc disease), lumbosacral 02/08/2005   ONSET DATE: 12/18/2023  REFERRING DIAG:   THERAPY DIAG:   Muscle weakness (generalized)  Rationale for Evaluation and Treatment: Rehabilitation  SUBJECTIVE:  SUBJECTIVE STATEMENT:   Pt. reports  that she met with her manager at work about her anticipated return to work.  Pt accompanied by: significant other  PERTINENT HISTORY: Pt. has Hx of stroke with onset 12/18/2023. Pt. PMHx includes: HTN, Hyperlipidemia, Kidney Stones, near syncopal event, loss of vision.  PRECAUTIONS: None  WEIGHT BEARING RESTRICTIONS: None  PAIN:  Are you having pain? No  FALLS: Has patient fallen in last 6 months? Yes. Number of falls    LIVING ENVIRONMENT: Lives with: lives with their family Lives in: [place], [place] Stairs: No, inside the house, yes but does not use Has following equipment at home:   PLOF: Independent  PATIENT GOALS: To be able to see  OBJECTIVE:   HAND DOMINANCE: Right  ADLs:  Eating: Drinking from straw is different, able to use utensils.  Grooming: Fatigues completing hair care.  UB Dressing: independent, if clothes are in front of her she can find clothing LB Dressing: Independent Toileting: Independent Bathing: Independent Tub Shower transfers: Independent Equipment: none Has difficulty with using cell phone  IADLs: Shopping: Does not typically go out shopping, and has not tried to. Light housekeeping: Pt. reports that she does not typically do house cleaning. Pt. has been able to unpack belongings from boxes due to her recent move in. Meal Prep: Pt. reports that is does not currently cook, however reports that she probably could. Has difficulty opening bottle caps/lids. Community mobility: Requires assistance from husband to navigate  through buildings, negotiate stairs-Pt. with increased fear of falling  Medication management: Able to push down medication bottles to open  Financial management: No change in the process-uses automatic bill pay system.  Has difficulty with using cell phone Handwriting: N/T Work: Pt. was  actively working managing a health visitor, works a lot of hours  MOBILITY STATUS: Independent and Needs Assist: Requires hand on hand assistance with nvigating through environments.   POSTURE COMMENTS:  No Significant postural limitations Sitting balance: Good  ACTIVITY TOLERANCE: Activity tolerance: Good  FUNCTIONAL OUTCOME MEASURES:   UPPER EXTREMITY ROM:    Active ROM Right Eval WFL Left Eval [facility]  Shoulder flexion    Shoulder abduction    Shoulder adduction    Shoulder extension    Shoulder internal rotation    Shoulder external rotation    Elbow flexion    Elbow extension    Wrist flexion    Wrist extension    Wrist ulnar deviation    Wrist radial deviation    Wrist pronation    Wrist supination    (Blank rows = not tested)  UPPER EXTREMITY MMT:     MMT Right eval Right 01/29/24 Right  04/04/24 Left eval Left 01/29/24 Left 04/04/24  Shoulder flexion 4-/5 5/5 5/5 4-/5 5/5 5/5  Shoulder abduction 4-/5 5/5 5/5 4-/5 5/5 5/5  Shoulder adduction        Shoulder extension        Shoulder internal rotation        Shoulder  external rotation        Middle trapezius        Lower trapezius        Elbow flexion 5/5 5/5 5/5 5/5 5/5 5/5  Elbow extension 5/5 5/5 5/5 5/5 5/5 5/5  Wrist flexion 4/5 5/5 5/5 4-/5 5/5 5/5  Wrist extension 4/5 5/5 5/5 4-/5 5/5 5/5  Wrist ulnar deviation        Wrist radial deviation        Wrist pronation        Wrist supination        (Blank rows = not tested)  HAND FUNCTION: Grip strength: Right: 39 lbs; Left: 28 lbs, Lateral pinch: Right: 11 lbs, Left: 9 lbs, and 3 point pinch: Right: 9 lbs, Left: 9 lbs  01/29/24 Grip strength: Right: 39 lbs; Left: 36 lbs, Lateral pinch: Right: 14 lbs, Left: 12 lbs, and 3 point pinch: Right: 12 lbs, Left: 11 lbs  04/04/24:  Grip strength: Right: 37 lbs; Left: 42 lbs  05/14/24  Grip strength: Right: 55 lbs; Left: 47 lbs  06/18/24  Grip strength: Right: 57 lbs; Left: 47 lbs Lateral  pinch: Right: 15 lbs, Left: 12 lbs, and 3 point pinch: Right: 14 lbs, Left: 11 lbs  COORDINATION:  9 Hole Peg test: Right: 30 sec; Left: 36 sec  01/29/24 9 Hole Peg test: Right: 24 sec; Left: 23 sec.  04/04/24:  9 Hole Peg test: Right: 28 sec; Left: 33 sec.  05/14/24:  9 Hole Peg test: Right: 23 sec; Left: 22 sec.  SENSATION: WFL  EDEMA:   MUSCLE TONE:   COGNITION: Overall cognitive status: Within functional limits for tasks assessed  VISION: Subjective report: Pt. reports changes in her vision at onset of stroke, starting with blurry vision, resulting in vision loss in the R eye.  Baseline vision: Pt. 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 -Worked on formulating a step-by-step list of her daily management schedule, including specific job related duties and time frames for each of the tasks. -Pt. education was provided about incorporating compensatory strategies into her daily work tasks.  -Reviewed strategies for devising a list of each of the duties on her  daily schedule.    PATIENT EDUCATION:  Compensatory strategies for visual perceptual functioning  HOME EXERCISE PROGRAM:  -Upgraded to blue theraputty for gross grip strengthening. -Green Theraputty exercises for  hand strengthening at bridge -Visual perceptual: Flip flop target design patterns. -Visual memory tasks -Writing tasks -visual motor tasks.  GOALS: Goals reviewed with patient? Yes  SHORT TERM GOALS: Target date: 06/25/2024   Pt. Will independently utilize  HEP for hand strength, coordination, and visual compensatory strategies ADL/ADLs Baseline:06/18/24:  Independent 05/14/24: Independent 04/12/23: Independent, continue 03/06/24: Independent Eval: No  current HEP  Goal status: Ongoing   LONG TERM GOALS: Target date: 08/06/2024  Pt. Will be able to independently implement visual scanning/visual search compensatory strategies for tasks within her extra personal space navigating through community environments 100% of the time.  Baseline: 01/29/24:  Independent  100% of the time within the therapy gym, and hallway. Eval: Pt. Requires increased assist to navigate through community environments. Goal status: Achieved  2.  Pt. Will be able to independently utilize visual scanning/visual search strategies  during ADLs/IADLs within her near space, and during tabletop tasks, and tasks at a vertical plane with 100% accuracy. Baseline: 06/18/24: 100% simple to complex visual scanning tasks however 75% accuracy for  moderate tocomplex visual scanning tasks at a horizontal plane tas, and scanning at a vertical plane. Pt. Pt. Is able to visually scan for multiple different items at once with 60% accuracy. 07/15/23: 90% simple to moderately complex visual scanning tasks however 75% accuracy for complex visual scanning tasks , and scanning at a vertical plane. 04/11/24: 80% simple to moderately complex visual scanning tasks however 70% accuracy for complex visual scanning tasks, and scanning at a vertical plane.  03/06/24: Initiates visual scanning 85% of the time  01/29/24: 75% Eval: Pt. Education to be provided.  Goal status: Progressing Ongoing  3.  Pt. Will be able to independently initiate visual scanning techniques in her own environment to reduce risk of falls.  Baseline: 01/29/24: Education was provided, Pt. is utlizing visual compensatory strategies/visual scanning within her home. Eval: Pt. Education to be provided.  Goal status: Achieved  4.  Pt. Will increase BUE Grip Strength by 5# to be able to independently and securely hold objects for ADL/IADL  use. Baseline: 05/14/24: Grip strength: Right: 55 lbs; Left: 47 lbs Pt. Has improved with holding items within her hand without dropping them.04/11/24: Continue- recent revision 04/04/2024: Grip strength: Right: 37 lbs; Left: 42 lbs patient is dropping items from her hands 01/29/24: Grip strength: Right: 39 lbs; Left: 28 lbs, Eval: Right Grip: 39#, Left Grip: 28# Goal status: Achieved  5.  Pt. Will increase L Lateral Pinch strength by 2# to be able to open bottles. Baseline: 06/18/24: Lateral pinch: Right: 15 lbs, Left: 12 lbs, and 3 point pinch: Right: 14 lbs, Left: 11 lbs 05/14/24: Pt. is able to open previously opened jars/bottles. Has difficulty with unopened jars/bottles.04/04/2024: Grip strength: Right: 37 lbs; Left: 42 lbs 01/29/24: Lateral pinch: Right: 14 lbs, Left: 12 lbs Eval: Lateral pinch: Right: 11 lbs, Left: 9 lbs  Goal status: Achieved  6.  Pt. Will increase BUE strength for shoulder flexion/abduction by 2 mm grades to independently complete hair care tasks. Baseline:  03/16/2024: 5/5 01/29/24: 5/5 overall Eval: Right Shoulder Flexion: 4-/5, Left Shoulder Flexion:4-/5, Right Shoulder Abduction: 4-/5, Left Shoulder Abduction: 4-/5 Goal status: Achieved  7.  Pt will be able to independently and efficiently manipulate medication without dropping them.  Baseline: 06/18/24: Pt. Is able to manipulate her medication consistently now without dropping them.05/14/24: 9 Hole Peg test: Right: 23 sec; Left: 22 sec. 04/11/24: Continue, recent revision 04/04/2024: 9 Hole Peg test: Right: 28 sec; Left: 33 sec. 01/29/24: Independent 01/15/24: Daughter currently manages medication set up.  Goal status: Ongoing  8. Pt will increase typing speed to 15 words per minute with at least 90% accuracy to work towards more efficient typing for job related  responsibilities.  Baseline: 06/18/24: 10 wpm with 87% accuracy with 8 typos for 4 sentences.05/14/24: Pt. Continues to present with multiple mistypes, and requires  increased time.05/14/24: 04/11/24: TBD 03/06/24: 10 wpm with 92% accuracy. 01/29/24: Continue 01/15/24: 8wpm x88% accuracy=7 wpm  Goal status: Ongoing/Modified to 15 wpm 06/18/24  9. Pt will be able to scan small print on store receipts to check for errors with 100% accuracy using visual compensation strategies as needed.  Baseline: 06/18/24: Independent and efficient with scanning receipts accurately 05/14/24: Pt. Is improving with efficiency of visually scanning receipts.04/11/24: Pt. Continues to present with difficulty with visual scanning small  small items efficiently. 03/06/24: Pt. Continues to present with difficulty with visual scanning small  small items efficiently. 01/29/24: Pt. Continues to have difficulty scanning small print items. 01/15/24: Not yet attempted; required for return to work/job responsibility  Goal status:  Achieved  10. Pt. will independently identify 8 items consistently from visual visual memory in preparation or ADLs.    Baseline: 06/18/24: 5-7 items depending upon the context of the task, less with with increased environmental stimulation, and distractions. 05/14/24: 5-7 items.04/11/24: 5-7 items  while seated at the tabletop. 03/06/24: Pt. is able to identify up to 6 picture items while seated at the tabletop. Pt. is able to consistently Identify 4 letters from visual memory when attention is divided.  01/29/24: Pt. is able to identify 6 items consistently from visual memory.    Gaol status: Ongoing      11. Pt. will be independently write 4 sentences  efficiently with 100% legibility, no deviation from writing on a blank line, and appropriate spacing between letters.              Baseline: 06/18/24: 4 sentences completed in printed form in 2 min. & 35 sec. With positive deviation above the line.05/14/24: 4 lines with 80% legibility in printed form, completed in 2 min. & 16 sec. 04/11/24: Continue 03/06/24: 4 lines with 75% legibility, positive deviation below the line, and  excessive spacing between the words.              Goal status: Ongoing  ASSESSMENT:  CLINICAL IMPRESSION:  Pt. 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 Pt. was able to devise a basic work task schedule, however added multiple management tasks to the completed schedule as she was reviewing it. Pt. 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 Pt. continues to benefit from Occupational Therapy services to improve her ability to use visual compensatory strategies, and improve overall BUE functioning in order to improve engagement in, and maximize overall independence with ADL, and IADL tasks.        PERFORMANCE DEFICITS: in functional skills including ADLs, IADLs, coordination, dexterity, Fine motor control, and vision, and psychosocial skills including coping strategies, environmental adaptation, habits, interpersonal interactions, and routines and behaviors.   IMPAIRMENTS: are limiting patient from ADLs, IADLs, rest and sleep, work, leisure, and social participation.   CO-MORBIDITIES: may have co-morbidities  that affects occupational performance. Patient will benefit from skilled OT to address above impairments and improve overall function.  MODIFICATION OR ASSISTANCE TO COMPLETE EVALUATION: Min-Moderate modification of tasks or assist with assess necessary to complete an evaluation.  OT OCCUPATIONAL PROFILE AND HISTORY: Detailed assessment: Review of records and additional review of physical, cognitive, psychosocial history related to current functional performance.  CLINICAL DECISION MAKING: Moderate -  several treatment options, min-mod task modification necessary  REHAB POTENTIAL: Good  EVALUATION COMPLEXITY: Moderate    PLAN:  OT FREQUENCY: 2x/week  OT DURATION: 12 weeks  PLANNED INTERVENTIONS: 97168 OT  Re-evaluation, 97535 self care/ADL training, 02889 therapeutic exercise, 97530 therapeutic activity, 97112 neuromuscular re-education, visual/perceptual remediation/compensation, patient/family education, and DME and/or AE instructions  RECOMMENDED OTHER SERVICES: ST  CONSULTED AND AGREED WITH PLAN OF CARE: Patient and family member/caregiver  PLAN FOR NEXT SESSION: Treatment  Richardson Otter, MS, OTR/L  06/25/2024, 5:24 PM    "

## 2024-06-25 NOTE — Therapy (Signed)
 " OUTPATIENT PHYSICAL THERAPY THORACOLUMBAR TREATMENT/ PHYSICAL THERAPY PROGRESS NOTE   Dates of reporting period  04/24/24   to   06/25/2024     Patient Name: Ashley Collins MRN: [id number] DOB:01/21/1961, 63 y.o., female Today's Date: 06/25/2024  END OF SESSION:  PT End of Session - 06/25/24 1503     Visit Number 11    Number of Visits 18    Date for Recertification  07/23/24    Progress Note Due on Visit 20    PT Start Time 1459    PT Stop Time 1529    PT Time Calculation (min) 30 min    Equipment Utilized During Treatment Gait belt    Activity Tolerance Patient tolerated treatment well    Behavior During Therapy WFL for tasks assessed/performed               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   Cardiac arrhythmia    Nonspecific ST T wave changes on EKG   Chronic venous insufficiency of lower extremity    Complication of anesthesia    nausea and vomiting   DDD (degenerative disc disease), lumbosacral    Essential hypertension    Headache    History of kidney stones    Hyperlipidemia    Kidney stones    Lymphedema    Migraines    Osteoporosis    PONV (postoperative nausea and vomiting)    Right ureteral stone    Vitamin B12 deficiency    Vitamin D deficiency    Past Surgical History:  Procedure Laterality Date   AUGMENTATION MAMMAPLASTY     CESAREAN SECTION     x 4   COLONOSCOPY WITH PROPOFOL  N/A 03/31/2022   Procedure: COLONOSCOPY WITH PROPOFOL ;  Surgeon: Onita Elspeth Sharper, DO;  Location: [facility];  Service: Gastroenterology;  Laterality: N/A;   CYSTOSCOPY W/ RETROGRADES Bilateral 07/10/2020   Procedure: CYSTOSCOPY WITH RETROGRADE PYELOGRAM;  Surgeon: Francisca Redell BROCKS, MD;  Location: [facility];  Service: Urology;  Laterality: Bilateral;   CYSTOSCOPY/URETEROSCOPY/HOLMIUM LASER/STENT PLACEMENT     CYSTOSCOPY/URETEROSCOPY/HOLMIUM LASER/STENT PLACEMENT Bilateral  07/10/2020   Procedure: CYSTOSCOPY/URETEROSCOPY/HOLMIUM LASER/STENT PLACEMENT;  Surgeon: Francisca Redell BROCKS, MD;  Location: [facility];  Service: Urology;  Laterality: Bilateral;   CYSTOSCOPY/URETEROSCOPY/HOLMIUM LASER/STENT PLACEMENT Right 08/25/2023   Procedure: CYSTOSCOPY/URETEROSCOPY/HOLMIUM LASER;  Surgeon: Francisca Redell BROCKS, MD;  Location: [facility];  Service: Urology;  Laterality: Right;   CYSTOSCOPY/URETEROSCOPY/HOLMIUM LASER/STENT PLACEMENT Right 12/15/2023   Procedure: CYSTOSCOPY/URETEROSCOPY/HOLMIUM LASER;  Surgeon: Francisca Redell BROCKS, MD;  Location: [facility];  Service: Urology;  Laterality: Right;   EXTRACORPOREAL SHOCK WAVE LITHOTRIPSY     x 10 plus   Eye Lift     FACIAL COSMETIC SURGERY     GANGLION CYST EXCISION Right 12/21/2021   Procedure: REMOVAL GANGLION OF WRIST;  Surgeon: Kathlynn Sharper, MD;  Location: [facility];  Service: Orthopedics;  Laterality: Right;   LIPOSUCTION     TONSILLECTOMY     TRIGGER FINGER RELEASE Right 12/21/2021   Procedure: RELEASE TRIGGER FINGER/A-1 PULLEY;  Surgeon: Kathlynn Sharper, MD;  Location: [facility];  Service: Orthopedics;  Laterality: Right;   URETEROSCOPY WITH HOLMIUM LASER LITHOTRIPSY     Patient Active Problem List   Diagnosis Date Noted   Acute CVA (cerebrovascular accident) (HCC) 12/19/2023   Hypokalemia 12/19/2023   AKI (acute kidney injury) 12/19/2023   Leukocytosis 12/19/2023   Hyperlipidemia, unspecified 12/19/2023  Essential hypertension 12/19/2023   Vision changes 12/19/2023   Lymphedema 05/22/2023   Chronic venous insufficiency 05/22/2023   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 replacement therapy 04/10/2023   Insomnia due to medical condition 04/25/2022   GAD (generalized anxiety disorder) 02/02/2022   Other specified depressive episodes 02/02/2022   Long-term current use of benzodiazepine 02/02/2022   Migraine with aura and without status migrainosus, not intractable 11/03/2020   Arrhythmia 08/06/2020   Status migrainosus 02/25/2019    Osteoporosis 04/09/2013   Vitamin D deficiency 04/09/2013   DDD (degenerative disc disease), lumbosacral 02/08/2005    PCP: Harvey Gaetana CROME, NP   REFERRING PROVIDER: Meeler, Benton CROME, FNP   REFERRING DIAG: M54.16 (ICD-10-CM) - Lumbar radiculitis   Rationale for Evaluation and Treatment: Rehabilitation  THERAPY DIAG:  Muscle weakness (generalized)  Pain in thoracic spine  Other low back pain  Difficulty in walking, not elsewhere classified  ONSET DATE: 1.5 months   SUBJECTIVE:                                                                                                                                                                                           SUBJECTIVE STATEMENT:   From today  Patient late to therapy this date.  Patient reports she is comfortable with plan for holding therapy daughter goes back to work to assess any further issues she may have.    From Eval:  Pt here to work on pain and discomfort in her mid back. Pt reports the more she is on her feet the more her back hurts and relief comes from being supine. Pt has not had any previous therapy for her back pain. Pt has epidural that assists with her pain below her belt line. Pt still in OT working on her upper extremity and grip strength. Pt has epidural shots for last 3 years. Pt initially hurt her back lifting something to put in her car. Patient can stand for maybe 30 min at a time before pain is no longer tolerable. She reports that sitting down does not improve her pain but lying on her side in would bring relief. Pt has history of osteoporosis and sliped disc in her back. Pt pain in this area is similar to pain she gets epidural for but just further superior into her thoracic spine.    PERTINENT HISTORY:  Patient familiar to this clinic before.  Was previously seen following CVA for mobility and balance deficits.  Still seeing speech and language pathologist and occupational therapy.  PAIN:  Are  you having pain? Yes: NPRS scale: 3-10 ( at end of day everyday) Pain location: above belt  line and radiating laterally  Pain description: nagging, throbbing pain  Aggravating factors: Standing Relieving factors: Lying down  PRECAUTIONS: None  RED FLAGS: None   WEIGHT BEARING RESTRICTIONS: No  FALLS:  Has patient fallen in last 6 months? No  LIVING ENVIRONMENT: Lives with: lives with SO Lives in: House/apartment Stairs: No Has following equipment at home: None  OCCUPATION: Merchandiser, Retail   PLOF: Independent and Independent with basic ADLs  PATIENT GOALS: improve back pain   NEXT MD VISIT: Not scheduled   OBJECTIVE:   Note: Objective measures were completed at Evaluation unless otherwise noted.  DIAGNOSTIC FINDINGS:  No recent images since to 2023 were mild degenerative changes in the lumbar spine were noted  PATIENT SURVEYS:  Modified Oswestry:  MODIFIED OSWESTRY DISABILITY SCALE  Date: 04/25/24 Score  Pain intensity 5 =  Pain medication has no effect on my pain.  2. Personal care (washing, dressing, etc.) 0 =  I can take care of myself normally without causing increased pain.  3. Lifting 5 =  I cannot lift or carry anything at all.  4. Walking 3 =  Pain prevents me from walking more than  mile.  5. Sitting 3 =  Pain prevents me from sitting more than  hour.  6. Standing 2 =  Pain prevents me from standing more than 1 hour  7. Sleeping 1 = I can sleep well only by using pain medication.  8. Social Life 2 = Pain prevents me from participating in more energetic activities (eg. sports, dancing).  9. Traveling 2 =  My pain restricts my travel over 2 hours.  10. Employment/ Homemaking 3 = Pain prevents me from doing anything but light duties.  Total 26/50   Interpretation of scores: Score Category Description  0-20% Minimal Disability The patient can cope with most living activities. Usually no treatment is indicated apart from advice on lifting, sitting and  exercise  21-40% Moderate Disability The patient experiences more pain and difficulty with sitting, lifting and standing. Travel and social life are more difficult and they may be disabled from work. Personal care, sexual activity and sleeping are not grossly affected, and the patient can usually be managed by conservative means  41-60% Severe Disability Pain remains the main problem in this group, but activities of daily living are affected. These patients require a detailed investigation  61-80% Crippled Back pain impinges on all aspects of the patients life. Positive intervention is required  81-100% Bed-bound These patients are either bed-bound or exaggerating their symptoms  Bluford FORBES Zoe DELENA Karon DELENA, et al. Surgery versus conservative management of stable thoracolumbar fracture: the PRESTO feasibility RCT. [place] ([place]): [employer]; 2021 Nov. [facility] Technology Assessment, No. 25.62.) Appendix 3, Oswestry Disability Index category descriptors. Available from: [email or web address].cz  Minimally Clinically Important Difference (MCID) = 12.8%  COGNITION: Overall cognitive status: Within functional limits for tasks assessed     SENSATION: WFL  MUSCLE LENGTH: Hamstrings: WFL during DLLT   POSTURE: rounded shoulders  PALPATION: Tender to palpation along lumbar paraspinals, origin of gluteal muscles, and some along thoracic paraspinals.  Patient had most pain along lumbar paraspinals and gluteal musculature on the left greater than right side  LUMBAR ROM:   AROM eval  Flexion WNL  Extension Min - tight   Right lateral flexion To knee- tight  Left lateral flexion To knee - tight   Right rotation WNL- tight   Left rotation WNL tight    (Blank rows = not tested)  LOWER EXTREMITY ROM:    Further hip range of motion may be beneficial  LOWER EXTREMITY MMT:    MMT Right eval Left eval  Hip flexion 4 4  Hip extension    Hip abduction 4+ 4+   Hip adduction 5 5  Hip internal rotation    Hip external rotation    Knee flexion 5 5  Knee extension 5 5  Ankle dorsiflexion 5 5  Ankle plantarflexion    Ankle inversion    Ankle eversion     (Blank rows = not tested) Measured in seated position   LUMBAR SPECIAL TESTS:  DLLT: could not sustain past 60 degrees  FUNCTIONAL TESTS:     TREATMENT DATE: 06/25/2024    TE- To improve strength, endurance, mobility, and function of specific targeted muscle groups or improve joint range of motion or improve muscle flexibility  Nustep level 4-8 rolling hills with MHP x 10 min with discussion of PT POC in the process.   Bridge 2x 10    Open book x 8 bil with 3-5 sec hold   Seated thoracic extension 2x 12 with GTB  Seated HABD 2 X 10 GTB  Pt session was cut a little short today due to pt arriving late to scheduled appointment time     PATIENT EDUCATION: Education details: POC; need to speak to MD about medication adjustments. Limitations and benefits of PT with return to work.  Person educated: Patient Education method: Explanation Education comprehension: verbalized understanding   HOME EXERCISE PROGRAM:  Access Code: [email or web address] URL: [email or web address] Date: 06/04/2024 Prepared by: Reyes London  Exercises - Clamshell  - 1 x daily - 7 x weekly - 3 sets - 10 reps - Bird Dog Progression  - 1 x daily - 7 x weekly - 1 sets - 10 reps - Sidelying Thoracic Rotation with Open Book  - 1 x daily - 7 x weekly - 1 sets - 10 reps - 5 sec hold - Seated Shoulder Row with Anchored Resistance  - 3 x weekly - 3 sets - 10 reps - Seated Shoulder Horizontal Abduction with Resistance  - 3 x weekly - 3 sets - 10 reps - Shoulder External Rotation and Scapular Retraction with Resistance  - 3 x weekly - 3 sets - 10 reps    Access Code: [email or web address] URL: [email or web address] Date: 04/30/2024 Prepared by: Lonni Gainer  Exercises - Clamshell  - 1 x  daily - 7 x weekly - 3 sets - 10 reps - Bird Dog Progression  - 1 x daily - 7 x weekly - 1 sets - 10 reps - Sidelying Thoracic Rotation with Open Book  - 1 x daily - 7 x weekly - 1 sets - 10 reps - 5 sec hold   ASSESSMENT:  CLINICAL IMPRESSION:  Continued with current plan of care as laid out in evaluation and recent prior sessions. Pt and PT discussed plan to take a break from PT until pt goes back to work. Pt also instructed to speak with MD regarding medication management for optimal improvement. Pt verbalized understanding of plan and pleased with POC and improvement thus far. Pt also comfortable with HEP handout for management.    OBJECTIVE IMPAIRMENTS: Abnormal gait.   ACTIVITY LIMITATIONS: carrying, standing, squatting, and locomotion level  PARTICIPATION LIMITATIONS: meal prep, cleaning, shopping, community activity, and occupation  PERSONAL FACTORS: Past/current experiences, Time since onset of injury/illness/exacerbation, and 3+ comorbidities: HTN, HLD, osteoporosis, B12 deficiency are also affecting patient's functional outcome.   REHAB  POTENTIAL: Good  CLINICAL DECISION MAKING: Stable/uncomplicated  EVALUATION COMPLEXITY: Low   GOALS: Goals reviewed with patient? Yes  SHORT TERM GOALS: Target date: 07/16/2024     Patient will be independent in home exercise program to improve strength/mobility for better functional independence with ADLs. Baseline:Provided  Goal status: progressing    LONG TERM GOALS: Target date: 07/23/2024    Patient will improve modified Oswestry pain disability questionnaire  to less than 10 points in order to indicate improvement in her subjective level of back pain as well as her ability to complete functional tasks without pain limitations Baseline: 26/50 12/29: 12/50 (met to decrease by 10pts) Goal status: MET/ revised   2.  Patient will improve double leg limb lowering test from 60 degrees to 30 degrees or greater showing good core  control throughout in order to indicate improved strength of anterior core muscles for improved lumbar stability Baseline: 60 degrees patient loses control and has some discomfort 12/29: able to perform in control without pain Goal status: MET  3.  Patient will report ability to stand for greater than 4 hour without limitations of pain over to allow her to return to work-related tasks with less limitation Baseline: When patient stands for 30 minutes per her report she starts to get intense pain that is only relieved by lying down 12/29: pt states that she can tolerate up to 2 hours without needing a break.  (Met for >1 hour) Goal status: MET/revised   4.  Patient report ability to lift medium objects(10-15lbs) from the floor and set them on an object such as a table in order without increase in back pain to show improved functional capacity as well as improved ability to return to work-related tasks Baseline: Patient reports she cannot lift or carry anything at all  12/29: pt reports no pain picking tennis ball up floor without pain. Was able to pick up remote from under bed without pain yesterday. But movement was a little challenging (met for light objects)  Goal status: MET/ revised      PLAN:  PT FREQUENCY: 1-2x/week  PT DURATION: 4 weeks  PLANNED INTERVENTIONS: 97750- Physical Performance Testing, 97110-Therapeutic exercises, 97530- Therapeutic activity, [id number]- Neuromuscular re-education, 97535- Self Care, [id number]- Manual therapy, [id number]- Gait training, [phone] (1-2 muscles), 20561 (3+ muscles)- Dry Needling, Patient/Family education, Balance training, Stair training, and Moist heat.  PLAN FOR NEXT SESSION:   Progress postural strengthening/awareness Reassess hip mobility following previous session. Standing endurance - Standing side plank and bird dog on wall   Educate on PT frequency adjustments given recent progress. (1x /week, or hold until return to work)   Note: Portions of  this document were prepared using Conservation officer, historic buildings and although reviewed may contain unintentional dictation errors in syntax, grammar, or spelling.  Lonni KATHEE Gainer PT ,DPT Physical Therapist- [facility]   3:04 PM 06/25/2024    "

## 2024-06-25 NOTE — Therapy (Signed)
 " OUTPATIENT SPEECH LANGUAGE PATHOLOGY  TREATMENT   Patient Name: Ashley Collins MRN: [id number] DOB:[birth year], 63 y.o., female Today's Date: 06/25/2024  PCP: Gaetana Haddock, NP  REFERRING PROVIDER: same    End of Session - 06/25/24 1618     Visit Number 33    Number of Visits 47    Date for Recertification  08/06/24    Progress Note Due on Visit 40    SLP Start Time 1530    SLP Stop Time  1615    SLP Time Calculation (min) 45 min    Activity Tolerance Patient tolerated treatment well           Patient Active Problem List   Diagnosis Date Noted   Acute CVA (cerebrovascular accident) (HCC) 12/19/2023   Hypokalemia 12/19/2023   AKI (acute kidney injury) 12/19/2023   Leukocytosis 12/19/2023   Hyperlipidemia, unspecified 12/19/2023   Essential hypertension 12/19/2023   Vision changes 12/19/2023   Lymphedema 05/22/2023   Chronic venous insufficiency 05/22/2023   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 replacement therapy 04/10/2023   Insomnia due to medical condition 04/25/2022   GAD (generalized anxiety disorder) 02/02/2022   Other specified depressive episodes 02/02/2022   Long-term current use of benzodiazepine 02/02/2022   Migraine with aura and without status migrainosus, not intractable 11/03/2020   Arrhythmia 08/06/2020   Status migrainosus 02/25/2019   Osteoporosis 04/09/2013   Vitamin D deficiency 04/09/2013   DDD (degenerative disc disease), lumbosacral 02/08/2005    ONSET DATE: 12/19/23   REFERRING DIAG: CVA- memory deficits  THERAPY DIAG:  Cognitive communication deficit  Rationale for Evaluation and Treatment Rehabilitation  SUBJECTIVE:   SUBJECTIVE STATEMENT: Pt alert, pleasant, and cooperative. Pt accompanied by: self and significant other  PERTINENT HISTORY & DIAGNOSTIC FINDINGS: Pt is 63 y.o. female who presents today for a cognitive-communication evaluation in setting of stroke. MRI 12/18/23 1. 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 No associated hemorrhage or mass effect. PMHx as outlined above.  PAIN:  Are you having pain? No   FALLS: Has patient fallen in last 6 months?  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 With min A, pt started entering into notes on iPhone with the hope that she can access from new Apple watch. Discussed use of notes for compensation for memory difficulty and quick access to important information. Pt to continue to enter information into notes and bring watch to next session.    PATIENT EDUCATION: Education details: as above Person educated: Patient and Spouse Education method: Explanation Education comprehension: verbalized understanding  HOME EXERCISE PROGRAM:        Entering pertinent work information into notes       GOALS:  Goals reviewed with patient? Yes  SHORT TERM GOALS: Target date: 10 sessions  Pt will complete PROM re: memory.  Baseline: Goal status: MET   2.  Pt will endorse successful implementation of at least x2 compensations for attention and memory.  Baseline:  Goal status: MET  3.  With Moderate A, patient will establish external aid for memory/executive function and bring to more than 50% of therapy sessions.  Baseline:  Goal status: MET  New goals - established 05/14/2024 4. Pt will utilize compensation for attention/recall (e.g. reading aloud, notetaking) to recall and summarize details in a functional task (e.g. therapy session, reading, tv program).  Baseline:  Goal status: MET   5. Pt will demonstrate alternating attention over 10  minutes between 2 mod complex cognitive-linguistic tasks >80% accuracy with modified Independence. Baseline: Goal status: PROGRESSING; continue goal x1 to ensure accuracy   6. Pt will identify x3 compensations for improved cognitive-communication during functional activities at home/work.   Baseline:   Goal status: MET    LONG TERM GOALS: Target date: 12 weeks  Pt will endorse improvement in cognitive-communication per PROM.  Baseline:  Goal status: MET  2.  Pt and/or husband will demonstrate understanding of ways to promote and support cognitive-communication outside of SLP sessions.  Baseline:  Goal status: PROGRESSING  New goal - established 05/14/24 3. Patient will report engagement in cognitive activities outside of ST 4/7 days.   Baseline:   Goal status: PROGRESSING  ASSESSMENT:  CLINICAL IMPRESSION:  Pt is 63 y.o. female who presents today for a cognitive-communication treatment in setting of stroke. Initial assessment completed via formal means (Cognitive-Linguistic Quick Test) and PROM (Neuro-QoL Adult Cognitive Function v2.0). Pt presents with at least mild cognitive-communication deficits affecting attention and visuospatial skills per CLQT as well as memory and executive functioning per PROM. Suspect CLQT is not as sensitive to higher level cognitive-communication deficits appreciated by pt during iADLs. Pt continues to make excellent progress; however, pt continues with impaired working memory/divided attention and short term recall. Pt planning to transition back to work in in January 2026 and pt would benefit from further compensation training and cognitive retraining to promote a successful transition back to work. Please see details of today's tx as outlined above. Recommend skilled ST services targeting above mentioned deficits to improve QoL and performance on ADLs/IADLs.  OBJECTIVE IMPAIRMENTS include attention, memory, executive functioning, and visuospatial  deficits. These impairments are limiting patient from return to work, managing appointments, household responsibilities, and ADLs/IADLs. Factors affecting potential to achieve goals and functional outcome are medical prognosis.. Patient will benefit from skilled SLP services to address above impairments and improve overall function.  REHAB POTENTIAL: Good  PLAN: SLP FREQUENCY: 1-2x/week  SLP DURATION: 12 weeks  PLANNED INTERVENTIONS: Cueing hierachy, Cognitive reorganization, Internal/external aids, Functional tasks, SLP instruction and feedback, Compensatory strategies, and Patient/family education    Delon Bangs, M.S., CCC-SLP Speech-Language Pathologist [place] [facility] [phone] [place])  [facility] at [facility] [address] [place], [place], [address] Phone: [phone]   Fax:  [phone]            "

## 2024-07-01 ENCOUNTER — Ambulatory Visit: Admitting: Occupational Therapy

## 2024-07-01 ENCOUNTER — Ambulatory Visit: Attending: Family Medicine

## 2024-07-01 ENCOUNTER — Ambulatory Visit: Admitting: Physical Therapy

## 2024-07-01 DIAGNOSIS — H543 Unqualified visual loss, both eyes: Secondary | ICD-10-CM | POA: Insufficient documentation

## 2024-07-01 DIAGNOSIS — M6281 Muscle weakness (generalized): Secondary | ICD-10-CM | POA: Insufficient documentation

## 2024-07-01 DIAGNOSIS — R278 Other lack of coordination: Secondary | ICD-10-CM | POA: Insufficient documentation

## 2024-07-01 DIAGNOSIS — R41841 Cognitive communication deficit: Secondary | ICD-10-CM | POA: Insufficient documentation

## 2024-07-01 DIAGNOSIS — H547 Unspecified visual loss: Secondary | ICD-10-CM | POA: Insufficient documentation

## 2024-07-01 NOTE — Therapy (Addendum)
 "   Occupational Therapy Treatment Note   Patient Name: Ashley Collins MRN: [id number] DOB:[birth year], 64 y.o., female Today's Date: 07/01/2024  PCP: Harvey Gaetana CROME, NP REFERRING PROVIDER: Harvey Gaetana CROME, NP   OT End of Session - 07/01/24 1452     Visit Number 53    Number of Visits 72    Date for Recertification  08/06/24    OT Start Time 1445    OT Stop Time 1530    OT Time Calculation (min) 45 min    Activity Tolerance Patient tolerated treatment well    Behavior During Therapy WFL for tasks assessed/performed               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   Cardiac arrhythmia    Nonspecific ST T wave changes on EKG   Chronic venous insufficiency of lower extremity    Complication of anesthesia    nausea and vomiting   DDD (degenerative disc disease), lumbosacral    Essential hypertension    Headache    History of kidney stones    Hyperlipidemia    Kidney stones    Lymphedema    Migraines    Osteoporosis    PONV (postoperative nausea and vomiting)    Right ureteral stone    Vitamin B12 deficiency    Vitamin D deficiency    Past Surgical History:  Procedure Laterality Date   AUGMENTATION MAMMAPLASTY     CESAREAN SECTION     x 4   COLONOSCOPY WITH PROPOFOL  N/A 03/31/2022   Procedure: COLONOSCOPY WITH PROPOFOL ;  Surgeon: Onita Elspeth Sharper, DO;  Location: [facility];  Service: Gastroenterology;  Laterality: N/A;   CYSTOSCOPY W/ RETROGRADES Bilateral 07/10/2020   Procedure: CYSTOSCOPY WITH RETROGRADE PYELOGRAM;  Surgeon: Francisca Redell BROCKS, MD;  Location: [facility];  Service: Urology;  Laterality: Bilateral;   CYSTOSCOPY/URETEROSCOPY/HOLMIUM LASER/STENT PLACEMENT     CYSTOSCOPY/URETEROSCOPY/HOLMIUM LASER/STENT PLACEMENT Bilateral 07/10/2020   Procedure: CYSTOSCOPY/URETEROSCOPY/HOLMIUM LASER/STENT PLACEMENT;  Surgeon: Francisca Redell BROCKS, MD;  Location: [facility];  Service:  Urology;  Laterality: Bilateral;   CYSTOSCOPY/URETEROSCOPY/HOLMIUM LASER/STENT PLACEMENT Right 08/25/2023   Procedure: CYSTOSCOPY/URETEROSCOPY/HOLMIUM LASER;  Surgeon: Francisca Redell BROCKS, MD;  Location: [facility];  Service: Urology;  Laterality: Right;   CYSTOSCOPY/URETEROSCOPY/HOLMIUM LASER/STENT PLACEMENT Right 12/15/2023   Procedure: CYSTOSCOPY/URETEROSCOPY/HOLMIUM LASER;  Surgeon: Francisca Redell BROCKS, MD;  Location: [facility];  Service: Urology;  Laterality: Right;   EXTRACORPOREAL SHOCK WAVE LITHOTRIPSY     x 10 plus   Eye Lift     FACIAL COSMETIC SURGERY     GANGLION CYST EXCISION Right 12/21/2021   Procedure: REMOVAL GANGLION OF WRIST;  Surgeon: Kathlynn Sharper, MD;  Location: [facility];  Service: Orthopedics;  Laterality: Right;   LIPOSUCTION     TONSILLECTOMY     TRIGGER FINGER RELEASE Right 12/21/2021   Procedure: RELEASE TRIGGER FINGER/A-1 PULLEY;  Surgeon: Kathlynn Sharper, MD;  Location: [facility];  Service: Orthopedics;  Laterality: Right;   URETEROSCOPY WITH HOLMIUM LASER LITHOTRIPSY     Patient Active Problem List   Diagnosis Date Noted   Acute CVA (cerebrovascular accident) (HCC) 12/19/2023   Hypokalemia 12/19/2023   AKI (acute kidney injury) 12/19/2023   Leukocytosis 12/19/2023   Hyperlipidemia, unspecified 12/19/2023   Essential hypertension 12/19/2023   Vision changes 12/19/2023   Lymphedema 05/22/2023   Chronic venous insufficiency 05/22/2023   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 replacement therapy 04/10/2023  Insomnia due to medical condition 04/25/2022   GAD (generalized anxiety disorder) 02/02/2022   Other specified depressive episodes 02/02/2022   Long-term current use of benzodiazepine 02/02/2022   Migraine with aura and without status migrainosus, not intractable 11/03/2020   Arrhythmia 08/06/2020   Status migrainosus 02/25/2019   Osteoporosis 04/09/2013   Vitamin D deficiency 04/09/2013   DDD (degenerative disc disease), lumbosacral 02/08/2005   ONSET DATE:  12/18/2023  REFERRING DIAG:   THERAPY DIAG:  Muscle weakness (generalized)  Rationale for Evaluation and Treatment: Rehabilitation  SUBJECTIVE:  SUBJECTIVE STATEMENT:   Pt. reports that she is going to try, and see how it goes when returning to work.  Pt accompanied by: significant other  PERTINENT HISTORY: Pt. has Hx of stroke with onset 12/18/2023. Pt. PMHx includes: HTN, Hyperlipidemia, Kidney Stones, near syncopal event, loss of vision.  PRECAUTIONS: None  WEIGHT BEARING RESTRICTIONS: None  PAIN:  Are you having pain? No  FALLS: Has patient fallen in last 6 months? Yes. Number of falls    LIVING ENVIRONMENT: Lives with: lives with their family Lives in: [place], [place] Stairs: No, inside the house, yes but does not use Has following equipment at home:   PLOF: Independent  PATIENT GOALS: To be able to see  OBJECTIVE:   HAND DOMINANCE: Right  ADLs:  Eating: Drinking from straw is different, able to use utensils.  Grooming: Fatigues completing hair care.  UB Dressing: independent, if clothes are in front of her she can find clothing LB Dressing: Independent Toileting: Independent Bathing: Independent Tub Shower transfers: Independent Equipment: none Has difficulty with using cell phone  IADLs: Shopping: Does not typically go out shopping, and has not tried to. Light housekeeping: Pt. reports that she does not typically do house cleaning. Pt. has been able to unpack belongings from boxes due to her recent move in. Meal Prep: Pt. reports that is does not currently cook, however reports that she probably could. Has difficulty opening bottle caps/lids. Community mobility: Requires assistance from husband to navigate  through buildings, negotiate stairs-Pt. with increased fear of falling  Medication management: Able to push down medication bottles to open  Financial management: No change in the process-uses automatic bill pay system.  Has difficulty with using cell  phone Handwriting: N/T Work: Pt. was actively working managing a health visitor, works a lot of hours  MOBILITY STATUS: Independent and Needs Assist: Requires hand on hand assistance with nvigating through environments.   POSTURE COMMENTS:  No Significant postural limitations Sitting balance: Good  ACTIVITY TOLERANCE: Activity tolerance: Good  FUNCTIONAL OUTCOME MEASURES:   UPPER EXTREMITY ROM:    Active ROM Right Eval WFL Left Eval [facility]  Shoulder flexion    Shoulder abduction    Shoulder adduction    Shoulder extension    Shoulder internal rotation    Shoulder external rotation    Elbow flexion    Elbow extension    Wrist flexion    Wrist extension    Wrist ulnar deviation    Wrist radial deviation    Wrist pronation    Wrist supination    (Blank rows = not tested)  UPPER EXTREMITY MMT:     MMT Right eval Right 01/29/24 Right  04/04/24 Left eval Left 01/29/24 Left 04/04/24  Shoulder flexion 4-/5 5/5 5/5 4-/5 5/5 5/5  Shoulder abduction 4-/5 5/5 5/5 4-/5 5/5 5/5  Shoulder adduction        Shoulder extension        Shoulder internal rotation  Shoulder external rotation        Middle trapezius        Lower trapezius        Elbow flexion 5/5 5/5 5/5 5/5 5/5 5/5  Elbow extension 5/5 5/5 5/5 5/5 5/5 5/5  Wrist flexion 4/5 5/5 5/5 4-/5 5/5 5/5  Wrist extension 4/5 5/5 5/5 4-/5 5/5 5/5  Wrist ulnar deviation        Wrist radial deviation        Wrist pronation        Wrist supination        (Blank rows = not tested)  HAND FUNCTION: Grip strength: Right: 39 lbs; Left: 28 lbs, Lateral pinch: Right: 11 lbs, Left: 9 lbs, and 3 point pinch: Right: 9 lbs, Left: 9 lbs  01/29/24 Grip strength: Right: 39 lbs; Left: 36 lbs, Lateral pinch: Right: 14 lbs, Left: 12 lbs, and 3 point pinch: Right: 12 lbs, Left: 11 lbs  04/04/24:  Grip strength: Right: 37 lbs; Left: 42 lbs  05/14/24  Grip strength: Right: 55 lbs; Left: 47 lbs  06/18/24  Grip strength:  Right: 57 lbs; Left: 47 lbs Lateral pinch: Right: 15 lbs, Left: 12 lbs, and 3 point pinch: Right: 14 lbs, Left: 11 lbs  COORDINATION:  9 Hole Peg test: Right: 30 sec; Left: 36 sec  01/29/24 9 Hole Peg test: Right: 24 sec; Left: 23 sec.  04/04/24:  9 Hole Peg test: Right: 28 sec; Left: 33 sec.  05/14/24:  9 Hole Peg test: Right: 23 sec; Left: 22 sec.  SENSATION: WFL  EDEMA:   MUSCLE TONE:   COGNITION: Overall cognitive status: Within functional limits for tasks assessed  VISION: Subjective report: Pt. reports changes in her vision at onset of stroke, starting with blurry vision, resulting in vision loss in the R eye.  Baseline vision: Pt. Is able to visually track  Visual history: Hx of eye surgery  VISION ASSESSMENT: TBD  PERCEPTION: WFL  PRAXIS: WFL  OBSERVATIONS:                                                                                                                    TREATMENT DATE: 07/01/24   Therapeutic Activities:    -Facilitated visual perceptual skills using small moderate, and complex Parquetry block design patterns working to connect multiple shapes out of smaller shape pieces in preparation for placing them onto the design pattern board.  PATIENT EDUCATION:  Compensatory strategies for visual perceptual functioning  HOME EXERCISE PROGRAM:  -Upgraded to blue theraputty for gross grip strengthening. -Green Theraputty exercises for  hand strengthening at bridge -Visual perceptual: Flip flop target design patterns. -Visual memory tasks -Writing tasks -visual motor tasks.  GOALS: Goals reviewed with patient? Yes  SHORT TERM GOALS: Target date: 06/25/2024   Pt. Will independently utilize HEP for hand strength, coordination, and visual compensatory strategies ADL/ADLs Baseline:06/18/24: Independent 05/14/24: Independent 04/12/23: Independent, continue 03/06/24: Independent Eval: No  current HEP  Goal status: Ongoing   LONG TERM GOALS:  Target date: 08/06/2024  Pt. Will be able to independently implement visual scanning/visual search compensatory strategies for tasks within her extra personal space navigating through community environments 100% of the time.  Baseline: 01/29/24:  Independent  100% of the time within the therapy gym, and hallway. Eval: Pt. Requires increased assist to navigate through community environments. Goal status: Achieved  2.  Pt. Will be able to independently utilize visual scanning/visual search strategies  during ADLs/IADLs within her near space, and during tabletop tasks, and tasks at a vertical plane with 100% accuracy. Baseline: 06/18/24: 100% simple to complex visual scanning tasks however 75% accuracy for  moderate tocomplex visual scanning tasks at a horizontal plane tas, and scanning at a vertical plane. Pt. Pt. Is able to visually scan for multiple different items at once with 60% accuracy. 07/15/23: 90% simple to moderately complex visual scanning tasks however 75% accuracy for complex visual scanning tasks , and scanning at a vertical plane. 04/11/24: 80% simple to moderately complex visual scanning tasks however 70% accuracy for complex visual scanning tasks, and scanning at a vertical plane.  03/06/24: Initiates visual scanning 85% of the time  01/29/24: 75% Eval: Pt. Education to be provided.  Goal status: Progressing Ongoing  3.  Pt. Will be able to independently initiate visual scanning techniques in her own environment to reduce risk of falls.  Baseline: 01/29/24: Education was provided, Pt. is utlizing visual compensatory strategies/visual scanning within her home. Eval: Pt. Education to be provided.  Goal status: Achieved  4.  Pt. Will increase BUE Grip Strength by 5# to be able to independently and securely hold objects for ADL/IADL use. Baseline: 05/14/24: Grip strength: Right: 55 lbs; Left: 47 lbs Pt. Has improved with holding items within her hand without dropping them.04/11/24: Continue-  recent revision 04/04/2024: Grip strength: Right: 37 lbs; Left: 42 lbs patient is dropping items from her hands 01/29/24: Grip strength: Right: 39 lbs; Left: 28 lbs, Eval: Right Grip: 39#, Left Grip: 28# Goal status: Achieved  5.  Pt. Will increase L Lateral Pinch strength by 2# to be able to open bottles. Baseline: 06/18/24: Lateral pinch: Right: 15 lbs, Left: 12 lbs, and 3 point pinch: Right: 14 lbs, Left: 11 lbs 05/14/24: Pt. is able to open previously opened jars/bottles. Has difficulty with unopened jars/bottles.04/04/2024: Grip strength: Right: 37 lbs; Left: 42 lbs 01/29/24: Lateral pinch: Right: 14 lbs, Left: 12 lbs Eval: Lateral pinch: Right: 11 lbs, Left: 9 lbs  Goal status: Achieved  6.  Pt. Will increase BUE strength for shoulder flexion/abduction by 2 mm grades to independently complete hair care tasks. Baseline:  03/16/2024: 5/5 01/29/24: 5/5 overall Eval: Right Shoulder Flexion: 4-/5, Left Shoulder Flexion:4-/5, Right Shoulder Abduction: 4-/5, Left Shoulder Abduction: 4-/5 Goal status: Achieved  7.  Pt will be able to independently and efficiently manipulate medication without dropping them.  Baseline: 06/18/24: Pt. Is able to manipulate her medication consistently now without dropping them.05/14/24: 9 Hole Peg test: Right: 23 sec; Left: 22 sec. 04/11/24: Continue, recent revision 04/04/2024: 9 Hole Peg test: Right: 28 sec; Left: 33 sec. 01/29/24: Independent 01/15/24: Daughter currently manages medication set up.  Goal status: Ongoing  8. Pt will increase typing speed to 15 words per minute with at least 90% accuracy to work towards more efficient typing for job related  responsibilities.  Baseline: 06/18/24: 10 wpm with 87% accuracy with 8 typos for 4 sentences.05/14/24: Pt. Continues to present with multiple mistypes, and requires increased time.05/14/24: 04/11/24: TBD 03/06/24: 10 wpm with 92%  accuracy. 01/29/24: Continue 01/15/24: 8wpm x88% accuracy=7 wpm  Goal status: Ongoing/Modified to 15  wpm 06/18/24  9. Pt will be able to scan small print on store receipts to check for errors with 100% accuracy using visual compensation strategies as needed.  Baseline: 06/18/24: Independent and efficient with scanning receipts accurately 05/14/24: Pt. Is improving with efficiency of visually scanning receipts.04/11/24: Pt. Continues to present with difficulty with visual scanning small  small items efficiently. 03/06/24: Pt. Continues to present with difficulty with visual scanning small  small items efficiently. 01/29/24: Pt. Continues to have difficulty scanning small print items. 01/15/24: Not yet attempted; required for return to work/job responsibility  Goal status:  Achieved  10. Pt. will independently identify 8 items consistently from visual visual memory in preparation or ADLs.    Baseline: 06/18/24: 5-7 items depending upon the context of the task, less with with increased environmental stimulation, and distractions. 05/14/24: 5-7 items.04/11/24: 5-7 items  while seated at the tabletop. 03/06/24: Pt. is able to identify up to 6 picture items while seated at the tabletop. Pt. is able to consistently Identify 4 letters from visual memory when attention is divided.  01/29/24: Pt. is able to identify 6 items consistently from visual memory.    Gaol status: Ongoing      11. Pt. will be independently write 4 sentences  efficiently with 100% legibility, no deviation from writing on a blank line, and appropriate spacing between letters.              Baseline: 06/18/24: 4 sentences completed in printed form in 2 min. & 35 sec. With positive deviation above the line.05/14/24: 4 lines with 80% legibility in printed form, completed in 2 min. & 16 sec. 04/11/24: Continue 03/06/24: 4 lines with 75% legibility, positive deviation below the line, and excessive spacing between the words.              Goal status: Ongoing  ASSESSMENT:  CLINICAL IMPRESSION:  Pt. reports that she starts back to work next week.  Pt. reports that she is concerned about her memory, and retaining new information. Pt. requires increased time, and cues to complete moderately complex Parquetry design patterns with difficulty. Pt.consistently required cues 2/2 having difficulty fitting 2 smaller triangle pieces together to fit in a larger triangle image. Pt. continues to benefit from Occupational Therapy services to improve her ability to use visual compensatory strategies, and improve overall BUE functioning in order to improve engagement in, and maximize overall independence with ADL, and IADL tasks.        PERFORMANCE DEFICITS: in functional skills including ADLs, IADLs, coordination, dexterity, Fine motor control, and vision, and psychosocial skills including coping strategies, environmental adaptation, habits, interpersonal interactions, and routines and behaviors.   IMPAIRMENTS: are limiting patient from ADLs, IADLs, rest and sleep, work, leisure, and social participation.   CO-MORBIDITIES: may have co-morbidities  that affects occupational performance. Patient will benefit from skilled OT to address above impairments and improve overall function.  MODIFICATION OR ASSISTANCE TO COMPLETE EVALUATION: Min-Moderate modification of tasks or assist with assess necessary to complete an evaluation.  OT OCCUPATIONAL PROFILE AND HISTORY: Detailed assessment: Review of records and additional review of physical, cognitive, psychosocial history related to current functional performance.  CLINICAL DECISION MAKING: Moderate - several treatment options, min-mod task modification necessary  REHAB POTENTIAL: Good  EVALUATION COMPLEXITY: Moderate    PLAN:  OT FREQUENCY: 2x/week  OT DURATION: 12 weeks  PLANNED INTERVENTIONS: 02831 OT Re-evaluation, 97535 self care/ADL training, 02889  therapeutic exercise, 97530 therapeutic activity, 97112 neuromuscular re-education, visual/perceptual remediation/compensation, patient/family education,  and DME and/or AE instructions  RECOMMENDED OTHER SERVICES: ST  CONSULTED AND AGREED WITH PLAN OF CARE: Patient and family member/caregiver  PLAN FOR NEXT SESSION: Treatment  Susanne Baumgarner, MS, OTR/L  07/01/2024, 3:01 PM    "

## 2024-07-01 NOTE — Therapy (Signed)
 " OUTPATIENT SPEECH LANGUAGE PATHOLOGY  TREATMENT   Patient Name: Ashley Collins MRN: [id number] DOB:07/30/1960, 64 y.o., female Today's Date: 07/01/2024  PCP: Gaetana Haddock, NP  REFERRING PROVIDER: same    End of Session - 07/01/24 1358     Visit Number 34    Number of Visits 47    Date for Recertification  08/06/24    SLP Start Time 1400    SLP Stop Time  1445    SLP Time Calculation (min) 45 min    Activity Tolerance Patient tolerated treatment well           Patient Active Problem List   Diagnosis Date Noted   Acute CVA (cerebrovascular accident) (HCC) 12/19/2023   Hypokalemia 12/19/2023   AKI (acute kidney injury) 12/19/2023   Leukocytosis 12/19/2023   Hyperlipidemia, unspecified 12/19/2023   Essential hypertension 12/19/2023   Vision changes 12/19/2023   Lymphedema 05/22/2023   Chronic venous insufficiency 05/22/2023   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 replacement therapy 04/10/2023   Insomnia due to medical condition 04/25/2022   GAD (generalized anxiety disorder) 02/02/2022   Other specified depressive episodes 02/02/2022   Long-term current use of benzodiazepine 02/02/2022   Migraine with aura and without status migrainosus, not intractable 11/03/2020   Arrhythmia 08/06/2020   Status migrainosus 02/25/2019   Osteoporosis 04/09/2013   Vitamin D deficiency 04/09/2013   DDD (degenerative disc disease), lumbosacral 02/08/2005    ONSET DATE: 12/19/23   REFERRING DIAG: CVA- memory deficits  THERAPY DIAG:  Cognitive communication deficit  Rationale for Evaluation and Treatment Rehabilitation  SUBJECTIVE:   SUBJECTIVE STATEMENT: Pt alert, pleasant, and cooperative. Pt accompanied by: self and significant other  PERTINENT HISTORY & DIAGNOSTIC FINDINGS: Pt is 64 y.o. female who presents today for a cognitive-communication evaluation in setting of stroke. MRI 12/18/23 1. 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 No associated  hemorrhage or mass effect. PMHx as outlined above.  PAIN:  Are you having pain? No   FALLS: Has patient fallen in last 6 months?  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 Initiated education re: ways to utilize to assist with memory and executive functioning. Introduced use of timers, accessing notes/calendars, and texting. Pt returned demo with rare-min cues. Will continue efforts.   PATIENT EDUCATION: Education details: as above Person educated: Patient and Spouse Education method: Explanation Education comprehension: verbalized understanding  HOME EXERCISE PROGRAM:        Entering pertinent work information into [employer]     GOALS:  Goals reviewed with patient? Yes  SHORT TERM GOALS: Target date: 10 sessions  Pt will complete PROM re: memory.  Baseline: Goal status: MET   2.  Pt will endorse successful implementation of at least x2 compensations for attention and memory.  Baseline:  Goal status: MET  3.  With Moderate A, patient will establish external aid for memory/executive function and bring to more than 50% of therapy sessions.    Baseline:  Goal status: MET  New goals - established 05/14/2024 4. Pt will utilize compensation for attention/recall (e.g. reading aloud, notetaking) to recall and summarize details in a functional task (e.g. therapy session, reading, tv  program).  Baseline:  Goal status: MET   5. Pt will demonstrate alternating attention over 10 minutes between 2 mod complex cognitive-linguistic tasks >80% accuracy with modified Independence. Baseline: Goal status: PROGRESSING; continue goal x1 to  ensure accuracy   6. Pt will identify x3 compensations for improved cognitive-communication during functional activities at home/work.   Baseline:   Goal status: MET    LONG TERM GOALS: Target date: 12 weeks  Pt will endorse improvement in cognitive-communication per PROM.  Baseline:  Goal status: MET  2.  Pt and/or husband will demonstrate understanding of ways to promote and support cognitive-communication outside of SLP sessions.  Baseline:  Goal status: PROGRESSING  New goal - established 05/14/24 3. Patient will report engagement in cognitive activities outside of ST 4/7 days.   Baseline:   Goal status: PROGRESSING  ASSESSMENT:  CLINICAL IMPRESSION:  Pt is 65 y.o. female who presents today for a cognitive-communication treatment in setting of stroke. Initial assessment completed via formal means (Cognitive-Linguistic Quick Test) and PROM (Neuro-QoL Adult Cognitive Function v2.0). Pt presents with at least mild cognitive-communication deficits affecting attention and visuospatial skills per CLQT as well as memory and executive functioning per PROM. Suspect CLQT is not as sensitive to higher level cognitive-communication deficits appreciated by pt during iADLs. Pt continues to make excellent progress; however, pt continues with impaired working memory/divided attention and short term recall. Pt planning to transition back to work in in January 2026 and pt would benefit from further compensation training and cognitive retraining to promote a successful transition back to work. Please see details of today's tx as outlined above. Recommend skilled ST services targeting above mentioned deficits to improve QoL and performance on ADLs/IADLs.  OBJECTIVE IMPAIRMENTS include attention, memory, executive functioning, and visuospatial deficits. These impairments are limiting patient from return to work, managing appointments, household responsibilities, and ADLs/IADLs. Factors affecting  potential to achieve goals and functional outcome are medical prognosis.. Patient will benefit from skilled SLP services to address above impairments and improve overall function.  REHAB POTENTIAL: Good  PLAN: SLP FREQUENCY: 1-2x/week  SLP DURATION: 12 weeks  PLANNED INTERVENTIONS: Cueing hierachy, Cognitive reorganization, Internal/external aids, Functional tasks, SLP instruction and feedback, Compensatory strategies, and Patient/family education    Delon Bangs, M.S., CCC-SLP Speech-Language Pathologist [place] [facility] [phone] [place])  [facility] Outpatient Rehabilitation at [facility] [address] [place], [place], [address] Phone: [phone]   Fax:  [phone]            "

## 2024-07-02 ENCOUNTER — Ambulatory Visit

## 2024-07-02 ENCOUNTER — Ambulatory Visit: Admitting: Occupational Therapy

## 2024-07-02 ENCOUNTER — Ambulatory Visit: Admitting: Physical Therapy

## 2024-07-04 ENCOUNTER — Ambulatory Visit

## 2024-07-04 ENCOUNTER — Ambulatory Visit: Admitting: Physical Therapy

## 2024-07-04 ENCOUNTER — Ambulatory Visit: Admitting: Occupational Therapy

## 2024-07-04 DIAGNOSIS — H543 Unqualified visual loss, both eyes: Secondary | ICD-10-CM

## 2024-07-04 DIAGNOSIS — R41841 Cognitive communication deficit: Secondary | ICD-10-CM

## 2024-07-04 NOTE — Therapy (Signed)
 " OUTPATIENT SPEECH LANGUAGE PATHOLOGY  TREATMENT   Patient Name: Ashley Collins MRN: [id number] DOB:11/08/1960, 64 y.o., female Today's Date: 07/04/2024  PCP: Gaetana Haddock, NP  REFERRING PROVIDER: same    End of Session - 07/04/24 1627     Visit Number 35    Number of Visits 47    Date for Recertification  08/06/24    Progress Note Due on Visit 40    SLP Start Time 1530    SLP Stop Time  1615    SLP Time Calculation (min) 45 min    Activity Tolerance Patient tolerated treatment well           Patient Active Problem List   Diagnosis Date Noted   Acute CVA (cerebrovascular accident) (HCC) 12/19/2023   Hypokalemia 12/19/2023   AKI (acute kidney injury) 12/19/2023   Leukocytosis 12/19/2023   Hyperlipidemia, unspecified 12/19/2023   Essential hypertension 12/19/2023   Vision changes 12/19/2023   Lymphedema 05/22/2023   Chronic venous insufficiency 05/22/2023   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 replacement therapy 04/10/2023   Insomnia due to medical condition 04/25/2022   GAD (generalized anxiety disorder) 02/02/2022   Other specified depressive episodes 02/02/2022   Long-term current use of benzodiazepine 02/02/2022   Migraine with aura and without status migrainosus, not intractable 11/03/2020   Arrhythmia 08/06/2020   Status migrainosus 02/25/2019   Osteoporosis 04/09/2013   Vitamin D deficiency 04/09/2013   DDD (degenerative disc disease), lumbosacral 02/08/2005    ONSET DATE: 12/19/23   REFERRING DIAG: CVA- memory deficits  THERAPY DIAG:  Cognitive communication deficit  Rationale for Evaluation and Treatment Rehabilitation  SUBJECTIVE:   SUBJECTIVE STATEMENT: Pt alert, pleasant, and cooperative. Pt accompanied by: self and significant other  PERTINENT HISTORY & DIAGNOSTIC FINDINGS: Pt is 64 y.o. female who presents today for a cognitive-communication evaluation in setting of stroke. MRI 12/18/23 1. 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 No associated hemorrhage or mass effect. PMHx as outlined above.  PAIN:  Are you having pain? No   FALLS: Has patient fallen in last 6 months?  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 Continued education re: ways to utilize to assist with memory and executive functioning. Reviewed use of timers, accessing notes/calendars, and texting. Introduced chief strategy officer and handout provided. Pt returned demo with rare-min cues. Will continue efforts.   PATIENT EDUCATION: Education details: as above Person educated: Patient and Spouse Education method: Explanation Education comprehension: verbalized understanding  HOME EXERCISE PROGRAM:        Entering pertinent work information into [employer]     GOALS:  Goals reviewed with patient? Yes  SHORT TERM GOALS: Target date: 10 sessions  Pt will complete PROM re: memory.  Baseline: Goal status: MET   2.  Pt will endorse successful implementation of at least x2 compensations for attention and memory.  Baseline:  Goal status: MET  3.  With Moderate A, patient will establish external aid for memory/executive function and bring to more than 50% of therapy sessions.    Baseline:  Goal status: MET  New goals - established 05/14/2024 4. Pt will utilize compensation for attention/recall (e.g. reading aloud,  notetaking) to recall and summarize details in a functional task (e.g. therapy session, reading, tv program).  Baseline:  Goal status: MET   5. Pt will demonstrate alternating attention over 10 minutes between 2 mod complex cognitive-linguistic tasks >80% accuracy with  modified Independence. Baseline: Goal status: PROGRESSING; continue goal x1 to ensure accuracy   6. Pt will identify x3 compensations for improved cognitive-communication during functional activities at home/work.   Baseline:   Goal status: MET    LONG TERM GOALS: Target date: 12 weeks  Pt will endorse improvement in cognitive-communication per PROM.  Baseline:  Goal status: MET  2.  Pt and/or husband will demonstrate understanding of ways to promote and support cognitive-communication outside of SLP sessions.  Baseline:  Goal status: PROGRESSING  New goal - established 05/14/24 3. Patient will report engagement in cognitive activities outside of ST 4/7 days.   Baseline:   Goal status: PROGRESSING  ASSESSMENT:  CLINICAL IMPRESSION:  Pt is 64 y.o. female who presents today for a cognitive-communication treatment in setting of stroke. Initial assessment completed via formal means (Cognitive-Linguistic Quick Test) and PROM (Neuro-QoL Adult Cognitive Function v2.0). Pt presents with at least mild cognitive-communication deficits affecting attention and visuospatial skills per CLQT as well as memory and executive functioning per PROM. Suspect CLQT is not as sensitive to higher level cognitive-communication deficits appreciated by pt during iADLs. Pt continues to make excellent progress; however, pt continues with impaired working memory/divided attention and short term recall. Pt planning to transition back to work in in January 2026 and pt would benefit from further compensation training and cognitive retraining to promote a successful transition back to work. Please see details of today's tx as outlined above. Recommend skilled ST services targeting above mentioned deficits to improve QoL and performance on ADLs/IADLs.  OBJECTIVE IMPAIRMENTS include attention, memory, executive functioning, and visuospatial deficits. These impairments are limiting patient from return to work, managing  appointments, household responsibilities, and ADLs/IADLs. Factors affecting potential to achieve goals and functional outcome are medical prognosis.. Patient will benefit from skilled SLP services to address above impairments and improve overall function.  REHAB POTENTIAL: Good  PLAN: SLP FREQUENCY: 1-2x/week  SLP DURATION: 12 weeks  PLANNED INTERVENTIONS: Cueing hierachy, Cognitive reorganization, Internal/external aids, Functional tasks, SLP instruction and feedback, Compensatory strategies, and Patient/family education    Delon Bangs, M.S., CCC-SLP Speech-Language Pathologist [place] [facility] [phone] [place])  [facility] Outpatient Rehabilitation at [facility] [address] [place], [place], [address] Phone: [phone]   Fax:  [phone]            "

## 2024-07-04 NOTE — Therapy (Signed)
 "   Occupational Therapy Treatment Note   Patient Name: Ashley Collins MRN: [id number] DOB:1960-08-05, 64 y.o., female Today's Date: 07/04/2024  PCP: Harvey Gaetana CROME, NP REFERRING PROVIDER: Harvey Gaetana CROME, NP   OT End of Session - 07/04/24 2314     Visit Number 54    Number of Visits 72    Date for Recertification  08/06/24    OT Start Time 1615    OT Stop Time 1700    OT Time Calculation (min) 45 min    Activity Tolerance Patient tolerated treatment well    Behavior During Therapy WFL for tasks assessed/performed               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   Cardiac arrhythmia    Nonspecific ST T wave changes on EKG   Chronic venous insufficiency of lower extremity    Complication of anesthesia    nausea and vomiting   DDD (degenerative disc disease), lumbosacral    Essential hypertension    Headache    History of kidney stones    Hyperlipidemia    Kidney stones    Lymphedema    Migraines    Osteoporosis    PONV (postoperative nausea and vomiting)    Right ureteral stone    Vitamin B12 deficiency    Vitamin D deficiency    Past Surgical History:  Procedure Laterality Date   AUGMENTATION MAMMAPLASTY     CESAREAN SECTION     x 4   COLONOSCOPY WITH PROPOFOL  N/A 03/31/2022   Procedure: COLONOSCOPY WITH PROPOFOL ;  Surgeon: Onita Elspeth Sharper, DO;  Location: [facility] ENDOSCOPY;  Service: Gastroenterology;  Laterality: N/A;   CYSTOSCOPY W/ RETROGRADES Bilateral 07/10/2020   Procedure: CYSTOSCOPY WITH RETROGRADE PYELOGRAM;  Surgeon: Francisca Redell BROCKS, MD;  Location: [facility] ORS;  Service: Urology;  Laterality: Bilateral;   CYSTOSCOPY/URETEROSCOPY/HOLMIUM LASER/STENT PLACEMENT     CYSTOSCOPY/URETEROSCOPY/HOLMIUM LASER/STENT PLACEMENT Bilateral 07/10/2020   Procedure: CYSTOSCOPY/URETEROSCOPY/HOLMIUM LASER/STENT PLACEMENT;  Surgeon: Francisca Redell BROCKS, MD;  Location: [facility] ORS;  Service:  Urology;  Laterality: Bilateral;   CYSTOSCOPY/URETEROSCOPY/HOLMIUM LASER/STENT PLACEMENT Right 08/25/2023   Procedure: CYSTOSCOPY/URETEROSCOPY/HOLMIUM LASER;  Surgeon: Francisca Redell BROCKS, MD;  Location: [facility] ORS;  Service: Urology;  Laterality: Right;   CYSTOSCOPY/URETEROSCOPY/HOLMIUM LASER/STENT PLACEMENT Right 12/15/2023   Procedure: CYSTOSCOPY/URETEROSCOPY/HOLMIUM LASER;  Surgeon: Francisca Redell BROCKS, MD;  Location: [facility] ORS;  Service: Urology;  Laterality: Right;   EXTRACORPOREAL SHOCK WAVE LITHOTRIPSY     x 10 plus   Eye Lift     FACIAL COSMETIC SURGERY     GANGLION CYST EXCISION Right 12/21/2021   Procedure: REMOVAL GANGLION OF WRIST;  Surgeon: Kathlynn Sharper, MD;  Location: [facility] ORS;  Service: Orthopedics;  Laterality: Right;   LIPOSUCTION     TONSILLECTOMY     TRIGGER FINGER RELEASE Right 12/21/2021   Procedure: RELEASE TRIGGER FINGER/A-1 PULLEY;  Surgeon: Kathlynn Sharper, MD;  Location: [facility] ORS;  Service: Orthopedics;  Laterality: Right;   URETEROSCOPY WITH HOLMIUM LASER LITHOTRIPSY     Patient Active Problem List   Diagnosis Date Noted   Acute CVA (cerebrovascular accident) (HCC) 12/19/2023   Hypokalemia 12/19/2023   AKI (acute kidney injury) 12/19/2023   Leukocytosis 12/19/2023   Hyperlipidemia, unspecified 12/19/2023   Essential hypertension 12/19/2023   Vision changes 12/19/2023   Lymphedema 05/22/2023   Chronic venous insufficiency 05/22/2023   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 replacement therapy 04/10/2023  Insomnia due to medical condition 04/25/2022   GAD (generalized anxiety disorder) 02/02/2022   Other specified depressive episodes 02/02/2022   Long-term current use of benzodiazepine 02/02/2022   Migraine with aura and without status migrainosus, not intractable 11/03/2020   Arrhythmia 08/06/2020   Status migrainosus 02/25/2019   Osteoporosis 04/09/2013   Vitamin D deficiency 04/09/2013   DDD (degenerative disc disease), lumbosacral 02/08/2005   ONSET DATE:  12/18/2023  REFERRING DIAG:   THERAPY DIAG:  Low vision, both eyes  Rationale for Evaluation and Treatment: Rehabilitation  SUBJECTIVE:  SUBJECTIVE STATEMENT:   Pt. reports that she has to return to work this coming Monday afternoon for a 4 hour shift.  Pt accompanied by: significant other  PERTINENT HISTORY: Pt. has Hx of stroke with onset 12/18/2023. Pt. PMHx includes: HTN, Hyperlipidemia, Kidney Stones, near syncopal event, loss of vision.  PRECAUTIONS: None  WEIGHT BEARING RESTRICTIONS: None  PAIN:  Are you having pain? No  FALLS: Has patient fallen in last 6 months? Yes. Number of falls    LIVING ENVIRONMENT: Lives with: lives with their family Lives in: [place], [place] Stairs: No, inside the house, yes but does not use Has following equipment at home:   PLOF: Independent  PATIENT GOALS: To be able to see  OBJECTIVE:   HAND DOMINANCE: Right  ADLs:  Eating: Drinking from straw is different, able to use utensils.  Grooming: Fatigues completing hair care.  UB Dressing: independent, if clothes are in front of her she can find clothing LB Dressing: Independent Toileting: Independent Bathing: Independent Tub Shower transfers: Independent Equipment: none Has difficulty with using cell phone  IADLs: Shopping: Does not typically go out shopping, and has not tried to. Light housekeeping: Pt. reports that she does not typically do house cleaning. Pt. has been able to unpack belongings from boxes due to her recent move in. Meal Prep: Pt. reports that is does not currently cook, however reports that she probably could. Has difficulty opening bottle caps/lids. Community mobility: Requires assistance from husband to navigate  through buildings, negotiate stairs-Pt. with increased fear of falling  Medication management: Able to push down medication bottles to open  Financial management: No change in the process-uses automatic bill pay system.  Has difficulty with using  cell phone Handwriting: N/T Work: Pt. was actively working managing a health visitor, works a lot of hours  MOBILITY STATUS: Independent and Needs Assist: Requires hand on hand assistance with nvigating through environments.   POSTURE COMMENTS:  No Significant postural limitations Sitting balance: Good  ACTIVITY TOLERANCE: Activity tolerance: Good  FUNCTIONAL OUTCOME MEASURES:   UPPER EXTREMITY ROM:    Active ROM Right Eval WFL Left Eval [facility]  Shoulder flexion    Shoulder abduction    Shoulder adduction    Shoulder extension    Shoulder internal rotation    Shoulder external rotation    Elbow flexion    Elbow extension    Wrist flexion    Wrist extension    Wrist ulnar deviation    Wrist radial deviation    Wrist pronation    Wrist supination    (Blank rows = not tested)  UPPER EXTREMITY MMT:     MMT Right eval Right 01/29/24 Right  04/04/24 Left eval Left 01/29/24 Left 04/04/24  Shoulder flexion 4-/5 5/5 5/5 4-/5 5/5 5/5  Shoulder abduction 4-/5 5/5 5/5 4-/5 5/5 5/5  Shoulder adduction        Shoulder extension        Shoulder  internal rotation        Shoulder external rotation        Middle trapezius        Lower trapezius        Elbow flexion 5/5 5/5 5/5 5/5 5/5 5/5  Elbow extension 5/5 5/5 5/5 5/5 5/5 5/5  Wrist flexion 4/5 5/5 5/5 4-/5 5/5 5/5  Wrist extension 4/5 5/5 5/5 4-/5 5/5 5/5  Wrist ulnar deviation        Wrist radial deviation        Wrist pronation        Wrist supination        (Blank rows = not tested)  HAND FUNCTION: Grip strength: Right: 39 lbs; Left: 28 lbs, Lateral pinch: Right: 11 lbs, Left: 9 lbs, and 3 point pinch: Right: 9 lbs, Left: 9 lbs  01/29/24 Grip strength: Right: 39 lbs; Left: 36 lbs, Lateral pinch: Right: 14 lbs, Left: 12 lbs, and 3 point pinch: Right: 12 lbs, Left: 11 lbs  04/04/24:  Grip strength: Right: 37 lbs; Left: 42 lbs  05/14/24  Grip strength: Right: 55 lbs; Left: 47 lbs  06/18/24  Grip  strength: Right: 57 lbs; Left: 47 lbs Lateral pinch: Right: 15 lbs, Left: 12 lbs, and 3 point pinch: Right: 14 lbs, Left: 11 lbs  COORDINATION:  9 Hole Peg test: Right: 30 sec; Left: 36 sec  01/29/24 9 Hole Peg test: Right: 24 sec; Left: 23 sec.  04/04/24:  9 Hole Peg test: Right: 28 sec; Left: 33 sec.  05/14/24:  9 Hole Peg test: Right: 23 sec; Left: 22 sec.  SENSATION: WFL  EDEMA:   MUSCLE TONE:   COGNITION: Overall cognitive status: Within functional limits for tasks assessed  VISION: Subjective report: Pt. reports changes in her vision at onset of stroke, starting with blurry vision, resulting in vision loss in the R eye.  Baseline vision: Pt. Is able to visually track  Visual history: Hx of eye surgery  VISION ASSESSMENT: TBD  PERCEPTION: WFL  PRAXIS: WFL  OBSERVATIONS:                                                                                                                    TREATMENT DATE: 07/04/24   Therapeutic Activities:    -Facilitated visual memory skills using simple, and moderately complex maps. -Facilitated visual scanning skills focused on visual scanning through complex maps to locate detailed responses to questions about the maps.  PATIENT EDUCATION:  Compensatory strategies for visual perceptual functioning  HOME EXERCISE PROGRAM:  -Upgraded to blue theraputty for gross grip strengthening. -Green Theraputty exercises for  hand strengthening at bridge -Visual perceptual: Flip flop target design patterns. -Visual memory tasks -Writing tasks -visual motor tasks.  GOALS: Goals reviewed with patient? Yes  SHORT TERM GOALS: Target date: 06/25/2024   Pt. Will independently utilize HEP for hand strength, coordination, and visual compensatory strategies ADL/ADLs Baseline:06/18/24: Independent 05/14/24: Independent 04/12/23: Independent, continue 03/06/24: Independent Eval: No  current HEP  Goal status:  Ongoing   LONG TERM GOALS:  Target date: 08/06/2024  Pt. Will be able to independently implement visual scanning/visual search compensatory strategies for tasks within her extra personal space navigating through community environments 100% of the time.  Baseline: 01/29/24:  Independent  100% of the time within the therapy gym, and hallway. Eval: Pt. Requires increased assist to navigate through community environments. Goal status: Achieved  2.  Pt. Will be able to independently utilize visual scanning/visual search strategies  during ADLs/IADLs within her near space, and during tabletop tasks, and tasks at a vertical plane with 100% accuracy. Baseline: 06/18/24: 100% simple to complex visual scanning tasks however 75% accuracy for  moderate tocomplex visual scanning tasks at a horizontal plane tas, and scanning at a vertical plane. Pt. Pt. Is able to visually scan for multiple different items at once with 60% accuracy. 07/15/23: 90% simple to moderately complex visual scanning tasks however 75% accuracy for complex visual scanning tasks , and scanning at a vertical plane. 04/11/24: 80% simple to moderately complex visual scanning tasks however 70% accuracy for complex visual scanning tasks, and scanning at a vertical plane.  03/06/24: Initiates visual scanning 85% of the time  01/29/24: 75% Eval: Pt. Education to be provided.  Goal status: Progressing Ongoing  3.  Pt. Will be able to independently initiate visual scanning techniques in her own environment to reduce risk of falls.  Baseline: 01/29/24: Education was provided, Pt. is utlizing visual compensatory strategies/visual scanning within her home. Eval: Pt. Education to be provided.  Goal status: Achieved  4.  Pt. Will increase BUE Grip Strength by 5# to be able to independently and securely hold objects for ADL/IADL use. Baseline: 05/14/24: Grip strength: Right: 55 lbs; Left: 47 lbs Pt. Has improved with holding items within her hand without dropping them.04/11/24: Continue-  recent revision 04/04/2024: Grip strength: Right: 37 lbs; Left: 42 lbs patient is dropping items from her hands 01/29/24: Grip strength: Right: 39 lbs; Left: 28 lbs, Eval: Right Grip: 39#, Left Grip: 28# Goal status: Achieved  5.  Pt. Will increase L Lateral Pinch strength by 2# to be able to open bottles. Baseline: 06/18/24: Lateral pinch: Right: 15 lbs, Left: 12 lbs, and 3 point pinch: Right: 14 lbs, Left: 11 lbs 05/14/24: Pt. is able to open previously opened jars/bottles. Has difficulty with unopened jars/bottles.04/04/2024: Grip strength: Right: 37 lbs; Left: 42 lbs 01/29/24: Lateral pinch: Right: 14 lbs, Left: 12 lbs Eval: Lateral pinch: Right: 11 lbs, Left: 9 lbs  Goal status: Achieved  6.  Pt. Will increase BUE strength for shoulder flexion/abduction by 2 mm grades to independently complete hair care tasks. Baseline:  03/16/2024: 5/5 01/29/24: 5/5 overall Eval: Right Shoulder Flexion: 4-/5, Left Shoulder Flexion:4-/5, Right Shoulder Abduction: 4-/5, Left Shoulder Abduction: 4-/5 Goal status: Achieved  7.  Pt will be able to independently and efficiently manipulate medication without dropping them.  Baseline: 06/18/24: Pt. Is able to manipulate her medication consistently now without dropping them.05/14/24: 9 Hole Peg test: Right: 23 sec; Left: 22 sec. 04/11/24: Continue, recent revision 04/04/2024: 9 Hole Peg test: Right: 28 sec; Left: 33 sec. 01/29/24: Independent 01/15/24: Daughter currently manages medication set up.  Goal status: Ongoing  8. Pt will increase typing speed to 15 words per minute with at least 90% accuracy to work towards more efficient typing for job related  responsibilities.  Baseline: 06/18/24: 10 wpm with 87% accuracy with 8 typos for 4 sentences.05/14/24: Pt. Continues to present with multiple mistypes, and requires increased time.05/14/24:  04/11/24: TBD 03/06/24: 10 wpm with 92% accuracy. 01/29/24: Continue 01/15/24: 8wpm x88% accuracy=7 wpm  Goal status: Ongoing/Modified to 15  wpm 06/18/24  9. Pt will be able to scan small print on store receipts to check for errors with 100% accuracy using visual compensation strategies as needed.  Baseline: 06/18/24: Independent and efficient with scanning receipts accurately 05/14/24: Pt. Is improving with efficiency of visually scanning receipts.04/11/24: Pt. Continues to present with difficulty with visual scanning small  small items efficiently. 03/06/24: Pt. Continues to present with difficulty with visual scanning small  small items efficiently. 01/29/24: Pt. Continues to have difficulty scanning small print items. 01/15/24: Not yet attempted; required for return to work/job responsibility  Goal status:  Achieved  10. Pt. will independently identify 8 items consistently from visual visual memory in preparation or ADLs.    Baseline: 06/18/24: 5-7 items depending upon the context of the task, less with with increased environmental stimulation, and distractions. 05/14/24: 5-7 items.04/11/24: 5-7 items  while seated at the tabletop. 03/06/24: Pt. is able to identify up to 6 picture items while seated at the tabletop. Pt. is able to consistently Identify 4 letters from visual memory when attention is divided.  01/29/24: Pt. is able to identify 6 items consistently from visual memory.    Gaol status: Ongoing      11. Pt. will be independently write 4 sentences  efficiently with 100% legibility, no deviation from writing on a blank line, and appropriate spacing between letters.              Baseline: 06/18/24: 4 sentences completed in printed form in 2 min. & 35 sec. With positive deviation above the line.05/14/24: 4 lines with 80% legibility in printed form, completed in 2 min. & 16 sec. 04/11/24: Continue 03/06/24: 4 lines with 75% legibility, positive deviation below the line, and excessive spacing between the words.              Goal status: Ongoing  ASSESSMENT:  CLINICAL IMPRESSION:  Pt. reports that she starts back to work for 4 hours  on Monday. Pt. reports that she is in the process of transferring her work tasks check off from the notes section of her phone to her Apple watch. Pt. plans to make a list of issues that arise for her at work to review/assist in problem solving through at the next session. Pt. was able to recall approximately 75% of the details on the simple maze, and 25% on a moderately difficult maze from visual memory.  Pt. was able to independently visually scan through the complex maze and locate the responses to the questions with 100% accuracy requiring increased time. Pt. continues to benefit from Occupational Therapy services to improve her ability to use visual compensatory strategies, and improve overall BUE functioning in order to improve engagement in, and maximize overall independence with ADL, and IADL tasks.        PERFORMANCE DEFICITS: in functional skills including ADLs, IADLs, coordination, dexterity, Fine motor control, and vision, and psychosocial skills including coping strategies, environmental adaptation, habits, interpersonal interactions, and routines and behaviors.   IMPAIRMENTS: are limiting patient from ADLs, IADLs, rest and sleep, work, leisure, and social participation.   CO-MORBIDITIES: may have co-morbidities  that affects occupational performance. Patient will benefit from skilled OT to address above impairments and improve overall function.  MODIFICATION OR ASSISTANCE TO COMPLETE EVALUATION: Min-Moderate modification of tasks or assist with assess necessary to complete an evaluation.  OT OCCUPATIONAL PROFILE AND HISTORY: Detailed  assessment: Review of records and additional review of physical, cognitive, psychosocial history related to current functional performance.  CLINICAL DECISION MAKING: Moderate - several treatment options, min-mod task modification necessary  REHAB POTENTIAL: Good  EVALUATION COMPLEXITY: Moderate    PLAN:  OT FREQUENCY: 2x/week  OT DURATION: 12  weeks  PLANNED INTERVENTIONS: 97168 OT Re-evaluation, 97535 self care/ADL training, 02889 therapeutic exercise, 97530 therapeutic activity, 97112 neuromuscular re-education, visual/perceptual remediation/compensation, patient/family education, and DME and/or AE instructions  RECOMMENDED OTHER SERVICES: ST  CONSULTED AND AGREED WITH PLAN OF CARE: Patient and family member/caregiver  PLAN FOR NEXT SESSION: Treatment  Richardson Otter, MS, OTR/L  07/04/2024, 11:17 PM    "

## 2024-07-09 ENCOUNTER — Ambulatory Visit: Admitting: Physical Therapy

## 2024-07-09 ENCOUNTER — Ambulatory Visit

## 2024-07-09 ENCOUNTER — Ambulatory Visit: Admitting: Occupational Therapy

## 2024-07-09 DIAGNOSIS — M6281 Muscle weakness (generalized): Secondary | ICD-10-CM

## 2024-07-09 DIAGNOSIS — R41841 Cognitive communication deficit: Secondary | ICD-10-CM

## 2024-07-09 NOTE — Therapy (Signed)
 "   Occupational Therapy Treatment Note   Patient Name: Ashley Collins MRN: [id number] DOB:Mar 29, 1961, 64 y.o., female Today's Date: 07/09/2024  PCP: Harvey Gaetana CROME, NP REFERRING PROVIDER: Harvey Gaetana CROME, NP   OT End of Session - 07/09/24 2258     Visit Number 55    Number of Visits 72    Date for Recertification  08/06/24    OT Start Time 1615    OT Stop Time 1700    OT Time Calculation (min) 45 min    Activity Tolerance Patient tolerated treatment well    Behavior During Therapy WFL for tasks assessed/performed               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   Cardiac arrhythmia    Nonspecific ST T wave changes on EKG   Chronic venous insufficiency of lower extremity    Complication of anesthesia    nausea and vomiting   DDD (degenerative disc disease), lumbosacral    Essential hypertension    Headache    History of kidney stones    Hyperlipidemia    Kidney stones    Lymphedema    Migraines    Osteoporosis    PONV (postoperative nausea and vomiting)    Right ureteral stone    Vitamin B12 deficiency    Vitamin D deficiency    Past Surgical History:  Procedure Laterality Date   AUGMENTATION MAMMAPLASTY     CESAREAN SECTION     x 4   COLONOSCOPY WITH PROPOFOL  N/A 03/31/2022   Procedure: COLONOSCOPY WITH PROPOFOL ;  Surgeon: Onita Elspeth Sharper, DO;  Location: [facility];  Service: Gastroenterology;  Laterality: N/A;   CYSTOSCOPY W/ RETROGRADES Bilateral 07/10/2020   Procedure: CYSTOSCOPY WITH RETROGRADE PYELOGRAM;  Surgeon: Francisca Redell BROCKS, MD;  Location: [facility] ORS;  Service: Urology;  Laterality: Bilateral;   CYSTOSCOPY/URETEROSCOPY/HOLMIUM LASER/STENT PLACEMENT     CYSTOSCOPY/URETEROSCOPY/HOLMIUM LASER/STENT PLACEMENT Bilateral 07/10/2020   Procedure: CYSTOSCOPY/URETEROSCOPY/HOLMIUM LASER/STENT PLACEMENT;  Surgeon: Francisca Redell BROCKS, MD;  Location: [facility] ORS;  Service:  Urology;  Laterality: Bilateral;   CYSTOSCOPY/URETEROSCOPY/HOLMIUM LASER/STENT PLACEMENT Right 08/25/2023   Procedure: CYSTOSCOPY/URETEROSCOPY/HOLMIUM LASER;  Surgeon: Francisca Redell BROCKS, MD;  Location: [facility];  Service: Urology;  Laterality: Right;   CYSTOSCOPY/URETEROSCOPY/HOLMIUM LASER/STENT PLACEMENT Right 12/15/2023   Procedure: CYSTOSCOPY/URETEROSCOPY/HOLMIUM LASER;  Surgeon: Francisca Redell BROCKS, MD;  Location: [facility];  Service: Urology;  Laterality: Right;   EXTRACORPOREAL SHOCK WAVE LITHOTRIPSY     x 10 plus   Eye Lift     FACIAL COSMETIC SURGERY     GANGLION CYST EXCISION Right 12/21/2021   Procedure: REMOVAL GANGLION OF WRIST;  Surgeon: Kathlynn Sharper, MD;  Location: [facility] ORS;  Service: Orthopedics;  Laterality: Right;   LIPOSUCTION     TONSILLECTOMY     TRIGGER FINGER RELEASE Right 12/21/2021   Procedure: RELEASE TRIGGER FINGER/A-1 PULLEY;  Surgeon: Kathlynn Sharper, MD;  Location: [facility] ORS;  Service: Orthopedics;  Laterality: Right;   URETEROSCOPY WITH HOLMIUM LASER LITHOTRIPSY     Patient Active Problem List   Diagnosis Date Noted   Acute CVA (cerebrovascular accident) (HCC) 12/19/2023   Hypokalemia 12/19/2023   AKI (acute kidney injury) 12/19/2023   Leukocytosis 12/19/2023   Hyperlipidemia, unspecified 12/19/2023   Essential hypertension 12/19/2023   Vision changes 12/19/2023   Lymphedema 05/22/2023   Chronic venous insufficiency 05/22/2023   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 replacement therapy 04/10/2023  Insomnia due to medical condition 04/25/2022   GAD (generalized anxiety disorder) 02/02/2022   Other specified depressive episodes 02/02/2022   Long-term current use of benzodiazepine 02/02/2022   Migraine with aura and without status migrainosus, not intractable 11/03/2020   Arrhythmia 08/06/2020   Status migrainosus 02/25/2019   Osteoporosis 04/09/2013   Vitamin D deficiency 04/09/2013   DDD (degenerative disc disease), lumbosacral 02/08/2005   ONSET DATE:  12/18/2023  REFERRING DIAG:   THERAPY DIAG:  Muscle weakness (generalized)  Rationale for Evaluation and Treatment: Rehabilitation  SUBJECTIVE:  SUBJECTIVE STATEMENT:   Pt. 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  Pt accompanied by: significant other  PERTINENT HISTORY: Pt. has Hx of stroke with onset 12/18/2023. Pt. PMHx includes: HTN, Hyperlipidemia, Kidney Stones, near syncopal event, loss of vision.  PRECAUTIONS: None  WEIGHT BEARING RESTRICTIONS: None  PAIN:  Are you having pain? No  FALLS: Has patient fallen in last 6 months? Yes. Number of falls    LIVING ENVIRONMENT: Lives with: lives with their family Lives in: [place], [place] Stairs: No, inside the house, yes but does not use Has following equipment at home:   PLOF: Independent  PATIENT GOALS: To be able to see  OBJECTIVE:   HAND DOMINANCE: Right  ADLs:  Eating: Drinking from straw is different, able to use utensils.  Grooming: Fatigues completing hair care.  UB Dressing: independent, if clothes are in front of her she can find clothing LB Dressing: Independent Toileting: Independent Bathing: Independent Tub Shower transfers: Independent Equipment: none Has difficulty with using cell phone  IADLs: Shopping: Does not typically go out shopping, and has not tried to. Light housekeeping: Pt. reports that she does not typically do house cleaning. Pt. has been able to unpack belongings from boxes due to her recent move in. Meal Prep: Pt. reports that is does not currently cook, however reports that she probably could. Has difficulty opening bottle caps/lids. Community mobility: Requires assistance from husband to navigate  through buildings, negotiate stairs-Pt. with increased fear of falling  Medication management: Able to push down medication bottles to open  Financial management: No change in the process-uses automatic bill pay system.  Has difficulty with using cell  phone Handwriting: N/T Work: Pt. was actively working managing a health visitor, works a lot of hours  MOBILITY STATUS: Independent and Needs Assist: Requires hand on hand assistance with nvigating through environments.   POSTURE COMMENTS:  No Significant postural limitations Sitting balance: Good  ACTIVITY TOLERANCE: Activity tolerance: Good  FUNCTIONAL OUTCOME MEASURES:   UPPER EXTREMITY ROM:    Active ROM Right Eval WFL Left Eval [facility]  Shoulder flexion    Shoulder abduction    Shoulder adduction    Shoulder extension    Shoulder internal rotation    Shoulder external rotation    Elbow flexion    Elbow extension    Wrist flexion    Wrist extension    Wrist ulnar deviation    Wrist radial deviation    Wrist pronation    Wrist supination    (Blank rows = not tested)  UPPER EXTREMITY MMT:     MMT Right eval Right 01/29/24 Right  04/04/24 Left eval Left 01/29/24 Left 04/04/24  Shoulder flexion 4-/5 5/5 5/5 4-/5 5/5 5/5  Shoulder abduction 4-/5 5/5 5/5 4-/5 5/5 5/5  Shoulder adduction        Shoulder extension        Shoulder internal rotation  Shoulder external rotation        Middle trapezius        Lower trapezius        Elbow flexion 5/5 5/5 5/5 5/5 5/5 5/5  Elbow extension 5/5 5/5 5/5 5/5 5/5 5/5  Wrist flexion 4/5 5/5 5/5 4-/5 5/5 5/5  Wrist extension 4/5 5/5 5/5 4-/5 5/5 5/5  Wrist ulnar deviation        Wrist radial deviation        Wrist pronation        Wrist supination        (Blank rows = not tested)  HAND FUNCTION: Grip strength: Right: 39 lbs; Left: 28 lbs, Lateral pinch: Right: 11 lbs, Left: 9 lbs, and 3 point pinch: Right: 9 lbs, Left: 9 lbs  01/29/24 Grip strength: Right: 39 lbs; Left: 36 lbs, Lateral pinch: Right: 14 lbs, Left: 12 lbs, and 3 point pinch: Right: 12 lbs, Left: 11 lbs  04/04/24:  Grip strength: Right: 37 lbs; Left: 42 lbs  05/14/24  Grip strength: Right: 55 lbs; Left: 47 lbs  06/18/24  Grip strength:  Right: 57 lbs; Left: 47 lbs Lateral pinch: Right: 15 lbs, Left: 12 lbs, and 3 point pinch: Right: 14 lbs, Left: 11 lbs  COORDINATION:  9 Hole Peg test: Right: 30 sec; Left: 36 sec  01/29/24 9 Hole Peg test: Right: 24 sec; Left: 23 sec.  04/04/24:  9 Hole Peg test: Right: 28 sec; Left: 33 sec.  05/14/24:  9 Hole Peg test: Right: 23 sec; Left: 22 sec.  SENSATION: WFL  EDEMA:   MUSCLE TONE:   COGNITION: Overall cognitive status: Within functional limits for tasks assessed  VISION: Subjective report: Pt. reports changes in her vision at onset of stroke, starting with blurry vision, resulting in vision loss in the R eye.  Baseline vision: Pt. Is able to visually track  Visual history: Hx of eye surgery  VISION ASSESSMENT: TBD  PERCEPTION: WFL  PRAXIS: WFL  OBSERVATIONS:                                                                                                                    TREATMENT DATE: 07/09/24  Therapeutic activities:   -Facilitated, and reviewed visual scanning skills for multiple items at one time while seated at the tabletop. -Dual tasking was added to the task to increase the complexity of the task, and challenge divided attention during the task while holding a conversation. -The task was further challenged by adding external environmental stimuli, to simulate a busy work environment with increased environmental stimuli. -Facilitated visual memory skills working to recall the multiple items completed during the visual scanning tasks task. -Focused on attention to detail reviewing her work for accuracy, and counting the number of items in each category.    PATIENT EDUCATION:  Compensatory strategies for visual perceptual functioning  HOME EXERCISE PROGRAM:  -Upgraded to blue theraputty for gross grip strengthening. Hollie Theraputty exercises for  hand strengthening at bridge -Visual perceptual: Flip  flop target design patterns. -Visual memory  tasks -Writing tasks -visual motor tasks.  GOALS: Goals reviewed with patient? Yes  SHORT TERM GOALS: Target date: 06/25/2024   Pt. Will independently utilize HEP for hand strength, coordination, and visual compensatory strategies ADL/ADLs Baseline:06/18/24: Independent 05/14/24: Independent 04/12/23: Independent, continue 03/06/24: Independent Eval: No  current HEP  Goal status: Ongoing   LONG TERM GOALS: Target date: 08/06/2024  Pt. Will be able to independently implement visual scanning/visual search compensatory strategies for tasks within her extra personal space navigating through community environments 100% of the time.  Baseline: 01/29/24:  Independent  100% of the time within the therapy gym, and hallway. Eval: Pt. Requires increased assist to navigate through community environments. Goal status: Achieved  2.  Pt. Will be able to independently utilize visual scanning/visual search strategies  during ADLs/IADLs within her near space, and during tabletop tasks, and tasks at a vertical plane with 100% accuracy. Baseline: 06/18/24: 100% simple to complex visual scanning tasks however 75% accuracy for  moderate tocomplex visual scanning tasks at a horizontal plane tas, and scanning at a vertical plane. Pt. Pt. Is able to visually scan for multiple different items at once with 60% accuracy. 07/15/23: 90% simple to moderately complex visual scanning tasks however 75% accuracy for complex visual scanning tasks , and scanning at a vertical plane. 04/11/24: 80% simple to moderately complex visual scanning tasks however 70% accuracy for complex visual scanning tasks, and scanning at a vertical plane.  03/06/24: Initiates visual scanning 85% of the time  01/29/24: 75% Eval: Pt. Education to be provided.  Goal status: Progressing Ongoing  3.  Pt. Will be able to independently initiate visual scanning techniques in her own environment to reduce risk of falls.  Baseline: 01/29/24: Education was provided,  Pt. is utlizing visual compensatory strategies/visual scanning within her home. Eval: Pt. Education to be provided.  Goal status: Achieved  4.  Pt. Will increase BUE Grip Strength by 5# to be able to independently and securely hold objects for ADL/IADL use. Baseline: 05/14/24: Grip strength: Right: 55 lbs; Left: 47 lbs Pt. Has improved with holding items within her hand without dropping them.04/11/24: Continue- recent revision 04/04/2024: Grip strength: Right: 37 lbs; Left: 42 lbs patient is dropping items from her hands 01/29/24: Grip strength: Right: 39 lbs; Left: 28 lbs, Eval: Right Grip: 39#, Left Grip: 28# Goal status: Achieved  5.  Pt. Will increase L Lateral Pinch strength by 2# to be able to open bottles. Baseline: 06/18/24: Lateral pinch: Right: 15 lbs, Left: 12 lbs, and 3 point pinch: Right: 14 lbs, Left: 11 lbs 05/14/24: Pt. is able to open previously opened jars/bottles. Has difficulty with unopened jars/bottles.04/04/2024: Grip strength: Right: 37 lbs; Left: 42 lbs 01/29/24: Lateral pinch: Right: 14 lbs, Left: 12 lbs Eval: Lateral pinch: Right: 11 lbs, Left: 9 lbs  Goal status: Achieved  6.  Pt. Will increase BUE strength for shoulder flexion/abduction by 2 mm grades to independently complete hair care tasks. Baseline:  03/16/2024: 5/5 01/29/24: 5/5 overall Eval: Right Shoulder Flexion: 4-/5, Left Shoulder Flexion:4-/5, Right Shoulder Abduction: 4-/5, Left Shoulder Abduction: 4-/5 Goal status: Achieved  7.  Pt will be able to independently and efficiently manipulate medication without dropping them.  Baseline: 06/18/24: Pt. Is able to manipulate her medication consistently now without dropping them.05/14/24: 9 Hole Peg test: Right: 23 sec; Left: 22 sec. 04/11/24: Continue, recent revision 04/04/2024: 9 Hole Peg test: Right: 28 sec; Left: 33 sec. 01/29/24: Independent 01/15/24: Daughter currently manages medication  set up.  Goal status: Ongoing  8. Pt will increase typing speed to 15 words per  minute with at least 90% accuracy to work towards more efficient typing for job related  responsibilities.  Baseline: 06/18/24: 10 wpm with 87% accuracy with 8 typos for 4 sentences.05/14/24: Pt. Continues to present with multiple mistypes, and requires increased time.05/14/24: 04/11/24: TBD 03/06/24: 10 wpm with 92% accuracy. 01/29/24: Continue 01/15/24: 8wpm x88% accuracy=7 wpm  Goal status: Ongoing/Modified to 15 wpm 06/18/24  9. Pt will be able to scan small print on store receipts to check for errors with 100% accuracy using visual compensation strategies as needed.  Baseline: 06/18/24: Independent and efficient with scanning receipts accurately 05/14/24: Pt. Is improving with efficiency of visually scanning receipts.04/11/24: Pt. Continues to present with difficulty with visual scanning small  small items efficiently. 03/06/24: Pt. Continues to present with difficulty with visual scanning small  small items efficiently. 01/29/24: Pt. Continues to have difficulty scanning small print items. 01/15/24: Not yet attempted; required for return to work/job responsibility  Goal status:  Achieved  10. Pt. will independently identify 8 items consistently from visual visual memory in preparation or ADLs.    Baseline: 06/18/24: 5-7 items depending upon the context of the task, less with with increased environmental stimulation, and distractions. 05/14/24: 5-7 items.04/11/24: 5-7 items  while seated at the tabletop. 03/06/24: Pt. is able to identify up to 6 picture items while seated at the tabletop. Pt. is able to consistently Identify 4 letters from visual memory when attention is divided.  01/29/24: Pt. is able to identify 6 items consistently from visual memory.    Gaol status: Ongoing      11. Pt. will be independently write 4 sentences  efficiently with 100% legibility, no deviation from writing on a blank line, and appropriate spacing between letters.              Baseline: 06/18/24: 4 sentences completed in  printed form in 2 min. & 35 sec. With positive deviation above the line.05/14/24: 4 lines with 80% legibility in printed form, completed in 2 min. & 16 sec. 04/11/24: Continue 03/06/24: 4 lines with 75% legibility, positive deviation below the line, and excessive spacing between the words.              Goal status: Ongoing  ASSESSMENT:  CLINICAL IMPRESSION:  Pt. returned to work. For 4 hours yesterday, and 4 hours today. 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 Pt. reports that she is concerned about remembering all the details, and pass codes. Pt. reports having the day off tomorrow, and is planning to place detailed directions, and pass codes in her Apple watch.  Reviewed compensatory strategies to assist with work related tasks. Pt. Presents with decreased accuracy with visual scanning tasks when challenged with dual tasking. Pt. Was able to check her work for details, and was able to correct most of the items requiring cues for 2. Pt. continues to benefit from Occupational Therapy services to improve her ability to use visual compensatory strategies, and improve overall BUE functioning in order to improve engagement in, and maximize overall independence with ADL, and IADL tasks.        PERFORMANCE DEFICITS: in functional skills including ADLs, IADLs, coordination, dexterity, Fine motor control, and vision, and psychosocial skills including coping strategies, environmental adaptation, habits, interpersonal interactions, and routines and behaviors.   IMPAIRMENTS: are limiting patient from ADLs, IADLs, rest and sleep, work, leisure, and social participation.  CO-MORBIDITIES: may have co-morbidities  that affects occupational performance. Patient will benefit from skilled OT to address above impairments and improve overall function.  MODIFICATION OR ASSISTANCE TO COMPLETE EVALUATION: Min-Moderate modification of tasks or assist with assess necessary to complete an  evaluation.  OT OCCUPATIONAL PROFILE AND HISTORY: Detailed assessment: Review of records and additional review of physical, cognitive, psychosocial history related to current functional performance.  CLINICAL DECISION MAKING: Moderate - several treatment options, min-mod task modification necessary  REHAB POTENTIAL: Good  EVALUATION COMPLEXITY: Moderate    PLAN:  OT FREQUENCY: 2x/week  OT DURATION: 12 weeks  PLANNED INTERVENTIONS: 97168 OT Re-evaluation, 97535 self care/ADL training, 02889 therapeutic exercise, 97530 therapeutic activity, 97112 neuromuscular re-education, visual/perceptual remediation/compensation, patient/family education, and DME and/or AE instructions  RECOMMENDED OTHER SERVICES: ST  CONSULTED AND AGREED WITH PLAN OF CARE: Patient and family member/caregiver  PLAN FOR NEXT SESSION: Treatment  Richardson Otter, MS, OTR/L  07/09/2024, 11:00 PM    "

## 2024-07-09 NOTE — Therapy (Signed)
 " OUTPATIENT SPEECH LANGUAGE PATHOLOGY  TREATMENT   Patient Name: Ashley Collins MRN: [id number] DOB:1960-09-05, 64 y.o., female Today's Date: 07/09/2024  PCP: Gaetana Haddock, NP  REFERRING PROVIDER: same    End of Session - 07/09/24 1529     Visit Number 36    Number of Visits 47    Date for Recertification  08/06/24    Progress Note Due on Visit 40    SLP Start Time 1530    SLP Stop Time  1615    SLP Time Calculation (min) 45 min    Activity Tolerance Patient tolerated treatment well           Patient Active Problem List   Diagnosis Date Noted   Acute CVA (cerebrovascular accident) (HCC) 12/19/2023   Hypokalemia 12/19/2023   AKI (acute kidney injury) 12/19/2023   Leukocytosis 12/19/2023   Hyperlipidemia, unspecified 12/19/2023   Essential hypertension 12/19/2023   Vision changes 12/19/2023   Lymphedema 05/22/2023   Chronic venous insufficiency 05/22/2023   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 replacement therapy 04/10/2023   Insomnia due to medical condition 04/25/2022   GAD (generalized anxiety disorder) 02/02/2022   Other specified depressive episodes 02/02/2022   Long-term current use of benzodiazepine 02/02/2022   Migraine with aura and without status migrainosus, not intractable 11/03/2020   Arrhythmia 08/06/2020   Status migrainosus 02/25/2019   Osteoporosis 04/09/2013   Vitamin D deficiency 04/09/2013   DDD (degenerative disc disease), lumbosacral 02/08/2005    ONSET DATE: 12/19/23   REFERRING DIAG: CVA- memory deficits  THERAPY DIAG:  Cognitive communication deficit  Rationale for Evaluation and Treatment Rehabilitation  SUBJECTIVE:   SUBJECTIVE STATEMENT: Pt alert, pleasant, and cooperative. Pt accompanied by: self and significant other  PERTINENT HISTORY & DIAGNOSTIC FINDINGS: Pt is 64 y.o. female who presents today for a cognitive-communication evaluation in setting of stroke. MRI 12/18/23 1. 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 No associated hemorrhage or mass effect. PMHx as outlined above.  PAIN:  Are you having pain? No   FALLS: Has patient fallen in last 6 months?  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 Continued education re: ways to utilize to assist with memory and executive functioning. Reviewed use of timers, accessing notes/calendars, texting, and reminder app and handout provided. Pt returned demo with rare-min cues. Will continue efforts.   PATIENT EDUCATION: Education details: as above Person educated: Patient and Spouse Education method: Explanation Education comprehension: verbalized understanding  HOME EXERCISE PROGRAM:        Entering pertinent work information into [employer]     GOALS:  Goals reviewed with patient? Yes  SHORT TERM GOALS: Target date: 10 sessions  Pt will complete PROM re: memory.  Baseline: Goal status: MET   2.  Pt will endorse successful implementation of at least x2 compensations for attention and memory.  Baseline:  Goal status: MET  3.  With Moderate A, patient will establish external aid for memory/executive function and bring to more than 50% of therapy sessions.    Baseline:  Goal status: MET  New goals - established 05/14/2024 4. Pt will utilize compensation for attention/recall (e.g. reading aloud, notetaking)  to recall and summarize details in a functional task (e.g. therapy session, reading, tv program).  Baseline:  Goal status: MET   5. Pt will demonstrate alternating attention over 10 minutes between 2 mod complex cognitive-linguistic tasks >80% accuracy with modified  Independence. Baseline: Goal status: PROGRESSING; continue goal x1 to ensure accuracy   6. Pt will identify x3 compensations for improved cognitive-communication during functional activities at home/work.   Baseline:   Goal status: MET    LONG TERM GOALS: Target date: 12 weeks  Pt will endorse improvement in cognitive-communication per PROM.  Baseline:  Goal status: MET  2.  Pt and/or husband will demonstrate understanding of ways to promote and support cognitive-communication outside of SLP sessions.  Baseline:  Goal status: PROGRESSING  New goal - established 05/14/24 3. Patient will report engagement in cognitive activities outside of ST 4/7 days.   Baseline:   Goal status: PROGRESSING  ASSESSMENT:  CLINICAL IMPRESSION:  Pt is 64 y.o. female who presents today for a cognitive-communication treatment in setting of stroke. Initial assessment completed via formal means (Cognitive-Linguistic Quick Test) and PROM (Neuro-QoL Adult Cognitive Function v2.0). Pt presents with at least mild cognitive-communication deficits affecting attention and visuospatial skills per CLQT as well as memory and executive functioning per PROM. Suspect CLQT is not as sensitive to higher level cognitive-communication deficits appreciated by pt during iADLs. Pt continues to make excellent progress; however, pt continues with impaired working memory/divided attention and short term recall. Pt planning to transition back to work in in January 2026 and pt would benefit from further compensation training and cognitive retraining to promote a successful transition back to work. Please see details of today's tx as outlined above. Recommend skilled ST services targeting above mentioned deficits to improve QoL and performance on ADLs/IADLs.  OBJECTIVE IMPAIRMENTS include attention, memory, executive functioning, and visuospatial deficits. These impairments are limiting patient from return to work, managing  appointments, household responsibilities, and ADLs/IADLs. Factors affecting potential to achieve goals and functional outcome are medical prognosis.. Patient will benefit from skilled SLP services to address above impairments and improve overall function.  REHAB POTENTIAL: Good  PLAN: SLP FREQUENCY: 1-2x/week  SLP DURATION: 12 weeks  PLANNED INTERVENTIONS: Cueing hierachy, Cognitive reorganization, Internal/external aids, Functional tasks, SLP instruction and feedback, Compensatory strategies, and Patient/family education    Delon Bangs, M.S., CCC-SLP Speech-Language Pathologist [place] [facility] [phone] [place])  [facility] Outpatient Rehabilitation at [facility] [address] [place], [place], [address] Phone: [phone]   Fax:  [phone]            "

## 2024-07-11 ENCOUNTER — Ambulatory Visit

## 2024-07-11 ENCOUNTER — Ambulatory Visit: Admitting: Occupational Therapy

## 2024-07-11 ENCOUNTER — Ambulatory Visit: Admitting: Physical Therapy

## 2024-07-11 DIAGNOSIS — R41841 Cognitive communication deficit: Secondary | ICD-10-CM

## 2024-07-11 DIAGNOSIS — H547 Unspecified visual loss: Secondary | ICD-10-CM

## 2024-07-11 DIAGNOSIS — M6281 Muscle weakness (generalized): Secondary | ICD-10-CM

## 2024-07-11 DIAGNOSIS — H543 Unqualified visual loss, both eyes: Secondary | ICD-10-CM

## 2024-07-11 DIAGNOSIS — R278 Other lack of coordination: Secondary | ICD-10-CM

## 2024-07-11 NOTE — Therapy (Signed)
 " OUTPATIENT SPEECH LANGUAGE PATHOLOGY  TREATMENT   Patient Name: Ashley Collins MRN: [id number] DOB:04-06-1961, 64 y.o., female Today's Date: 07/11/2024  PCP: Gaetana Haddock, NP  REFERRING PROVIDER: same    End of Session - 07/11/24 1530     Visit Number 37    Number of Visits 47    Date for Recertification  08/06/24    Progress Note Due on Visit 40    SLP Start Time 1530    SLP Stop Time  1615    SLP Time Calculation (min) 45 min    Activity Tolerance Patient tolerated treatment well           Patient Active Problem List   Diagnosis Date Noted   Acute CVA (cerebrovascular accident) (HCC) 12/19/2023   Hypokalemia 12/19/2023   AKI (acute kidney injury) 12/19/2023   Leukocytosis 12/19/2023   Hyperlipidemia, unspecified 12/19/2023   Essential hypertension 12/19/2023   Vision changes 12/19/2023   Lymphedema 05/22/2023   Chronic venous insufficiency 05/22/2023   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 replacement therapy 04/10/2023   Insomnia due to medical condition 04/25/2022   GAD (generalized anxiety disorder) 02/02/2022   Other specified depressive episodes 02/02/2022   Long-term current use of benzodiazepine 02/02/2022   Migraine with aura and without status migrainosus, not intractable 11/03/2020   Arrhythmia 08/06/2020   Status migrainosus 02/25/2019   Osteoporosis 04/09/2013   Vitamin D deficiency 04/09/2013   DDD (degenerative disc disease), lumbosacral 02/08/2005    ONSET DATE: 12/19/23   REFERRING DIAG: CVA- memory deficits  THERAPY DIAG:  Cognitive communication deficit  Rationale for Evaluation and Treatment Rehabilitation  SUBJECTIVE:   SUBJECTIVE STATEMENT: Pt alert, pleasant, and cooperative. Pt accompanied by: self and significant other  PERTINENT HISTORY & DIAGNOSTIC FINDINGS: Pt is 64 y.o. female who presents today for a cognitive-communication evaluation in setting of stroke. MRI 12/18/23 1. 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 No associated hemorrhage or mass effect. PMHx as outlined above.  PAIN:  Are you having pain? No   FALLS: Has patient fallen in last 6 months?  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 Continued education re: ways to utilize to assist with memory and executive functioning. Reviewed use of timers, accessing notes/calendars, texting, and reminder app and handout provided. Pt returned demo with rare-min cues. Will continue efforts.  Pt endorsed difficulty learning new POS system at work as well as mental fatigue after her abbreviated work day. Reflection homework given for pt to complete following work days. Pt to complete for HEP.    PATIENT EDUCATION: Education details: as above Person educated: Patient and Spouse Education method: Explanation Education comprehension: verbalized understanding  HOME EXERCISE PROGRAM:        Entering pertinent work information into [employer]  Reflection homework     GOALS:  Goals reviewed with patient? Yes  SHORT TERM GOALS: Target date: 10 sessions  Pt will complete PROM re: memory.  Baseline: Goal status: MET   2.  Pt will endorse successful implementation of at least x2 compensations for attention and memory.  Baseline:  Goal status: MET  3.  With Moderate A, patient will establish  external aid for memory/executive function and bring to more than 50% of therapy sessions.    Baseline:  Goal status: MET  New goals - established 05/14/2024 4. Pt will utilize compensation for attention/recall (e.g. reading aloud, notetaking) to recall and summarize details in a  functional task (e.g. therapy session, reading, tv program).  Baseline:  Goal status: MET   5. Pt will demonstrate alternating attention over 10 minutes between 2 mod complex cognitive-linguistic tasks >80% accuracy with modified Independence. Baseline: Goal status: PROGRESSING; continue goal x1 to ensure accuracy   6. Pt will identify x3 compensations for improved cognitive-communication during functional activities at home/work.   Baseline:   Goal status: MET    LONG TERM GOALS: Target date: 12 weeks  Pt will endorse improvement in cognitive-communication per PROM.  Baseline:  Goal status: MET  2.  Pt and/or husband will demonstrate understanding of ways to promote and support cognitive-communication outside of SLP sessions.  Baseline:  Goal status: PROGRESSING  New goal - established 05/14/24 3. Patient will report engagement in cognitive activities outside of ST 4/7 days.   Baseline:   Goal status: PROGRESSING  ASSESSMENT:  CLINICAL IMPRESSION:  Pt is 64 y.o. female who presents today for a cognitive-communication treatment in setting of stroke. Initial assessment completed via formal means (Cognitive-Linguistic Quick Test) and PROM (Neuro-QoL Adult Cognitive Function v2.0). Pt presents with at least mild cognitive-communication deficits affecting attention and visuospatial skills per CLQT as well as memory and executive functioning per PROM. Suspect CLQT is not as sensitive to higher level cognitive-communication deficits appreciated by pt during iADLs. Pt continues to make excellent progress; however, pt continues with impaired working memory/divided attention and short term recall. Pt planning to transition back to work in in January 2026 and pt would benefit from further compensation training and cognitive retraining to promote a successful transition back to work. Please see details of today's tx as outlined above. Recommend skilled ST services targeting above mentioned  deficits to improve QoL and performance on ADLs/IADLs.  OBJECTIVE IMPAIRMENTS include attention, memory, executive functioning, and visuospatial deficits. These impairments are limiting patient from return to work, managing appointments, household responsibilities, and ADLs/IADLs. Factors affecting potential to achieve goals and functional outcome are medical prognosis.. Patient will benefit from skilled SLP services to address above impairments and improve overall function.  REHAB POTENTIAL: Good  PLAN: SLP FREQUENCY: 1-2x/week  SLP DURATION: 12 weeks  PLANNED INTERVENTIONS: Cueing hierachy, Cognitive reorganization, Internal/external aids, Functional tasks, SLP instruction and feedback, Compensatory strategies, and Patient/family education    Delon Bangs, M.S., CCC-SLP Speech-Language Pathologist [place] [facility] [phone] [place])  [facility] Outpatient Rehabilitation at [facility] [address] [place], [place], [address] Phone: [phone]   Fax:  [phone]            "

## 2024-07-16 ENCOUNTER — Ambulatory Visit: Admitting: Occupational Therapy

## 2024-07-16 ENCOUNTER — Ambulatory Visit: Admitting: Physical Therapy

## 2024-07-16 ENCOUNTER — Ambulatory Visit

## 2024-07-17 ENCOUNTER — Ambulatory Visit: Admitting: Physical Therapy

## 2024-07-17 ENCOUNTER — Ambulatory Visit: Admitting: Occupational Therapy

## 2024-07-18 ENCOUNTER — Ambulatory Visit: Admitting: Occupational Therapy

## 2024-07-18 ENCOUNTER — Ambulatory Visit: Admitting: Speech Pathology

## 2024-07-18 ENCOUNTER — Ambulatory Visit: Admitting: Physical Therapy

## 2024-07-18 ENCOUNTER — Encounter: Payer: Self-pay | Admitting: Occupational Therapy

## 2024-07-18 DIAGNOSIS — M6281 Muscle weakness (generalized): Secondary | ICD-10-CM

## 2024-07-18 NOTE — Therapy (Signed)
 "   Occupational Therapy Treatment Note   Patient Name: Ashley Collins MRN: [id number] DOB:11/17/1960, 64 y.o., female   PCP: Harvey Gaetana CROME, NP REFERRING PROVIDER: Harvey Gaetana CROME, NP   OT End of Session - 07/18/24 1050     Visit Number 56    Number of Visits 72    Date for Recertification  08/06/24    OT Start Time 1630    OT Stop Time 1740    OT Time Calculation (min) 70 min    Activity Tolerance Patient tolerated treatment well    Behavior During Therapy WFL for tasks assessed/performed               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   Cardiac arrhythmia    Nonspecific ST T wave changes on EKG   Chronic venous insufficiency of lower extremity    Complication of anesthesia    nausea and vomiting   DDD (degenerative disc disease), lumbosacral    Essential hypertension    Headache    History of kidney stones    Hyperlipidemia    Kidney stones    Lymphedema    Migraines    Osteoporosis    PONV (postoperative nausea and vomiting)    Right ureteral stone    Vitamin B12 deficiency    Vitamin D deficiency    Past Surgical History:  Procedure Laterality Date   AUGMENTATION MAMMAPLASTY     CESAREAN SECTION     x 4   COLONOSCOPY WITH PROPOFOL  N/A 03/31/2022   Procedure: COLONOSCOPY WITH PROPOFOL ;  Surgeon: Onita Elspeth Sharper, DO;  Location: [facility] ENDOSCOPY;  Service: Gastroenterology;  Laterality: N/A;   CYSTOSCOPY W/ RETROGRADES Bilateral 07/10/2020   Procedure: CYSTOSCOPY WITH RETROGRADE PYELOGRAM;  Surgeon: Francisca Redell BROCKS, MD;  Location: [facility] ORS;  Service: Urology;  Laterality: Bilateral;   CYSTOSCOPY/URETEROSCOPY/HOLMIUM LASER/STENT PLACEMENT     CYSTOSCOPY/URETEROSCOPY/HOLMIUM LASER/STENT PLACEMENT Bilateral 07/10/2020   Procedure: CYSTOSCOPY/URETEROSCOPY/HOLMIUM LASER/STENT PLACEMENT;  Surgeon: Francisca Redell BROCKS, MD;  Location: [facility] ORS;  Service: Urology;  Laterality:  Bilateral;   CYSTOSCOPY/URETEROSCOPY/HOLMIUM LASER/STENT PLACEMENT Right 08/25/2023   Procedure: CYSTOSCOPY/URETEROSCOPY/HOLMIUM LASER;  Surgeon: Francisca Redell BROCKS, MD;  Location: [facility] ORS;  Service: Urology;  Laterality: Right;   CYSTOSCOPY/URETEROSCOPY/HOLMIUM LASER/STENT PLACEMENT Right 12/15/2023   Procedure: CYSTOSCOPY/URETEROSCOPY/HOLMIUM LASER;  Surgeon: Francisca Redell BROCKS, MD;  Location: [facility] ORS;  Service: Urology;  Laterality: Right;   EXTRACORPOREAL SHOCK WAVE LITHOTRIPSY     x 10 plus   Eye Lift     FACIAL COSMETIC SURGERY     GANGLION CYST EXCISION Right 12/21/2021   Procedure: REMOVAL GANGLION OF WRIST;  Surgeon: Kathlynn Sharper, MD;  Location: [facility] ORS;  Service: Orthopedics;  Laterality: Right;   LIPOSUCTION     TONSILLECTOMY     TRIGGER FINGER RELEASE Right 12/21/2021   Procedure: RELEASE TRIGGER FINGER/A-1 PULLEY;  Surgeon: Kathlynn Sharper, MD;  Location: [facility] ORS;  Service: Orthopedics;  Laterality: Right;   URETEROSCOPY WITH HOLMIUM LASER LITHOTRIPSY     Patient Active Problem List   Diagnosis Date Noted   Acute CVA (cerebrovascular accident) (HCC) 12/19/2023   Hypokalemia 12/19/2023   AKI (acute kidney injury) 12/19/2023   Leukocytosis 12/19/2023   Hyperlipidemia, unspecified 12/19/2023   Essential hypertension 12/19/2023   Vision changes 12/19/2023   Lymphedema 05/22/2023   Chronic venous insufficiency 05/22/2023   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 replacement therapy 04/10/2023  Insomnia due to medical condition 04/25/2022   GAD (generalized anxiety disorder) 02/02/2022   Other specified depressive episodes 02/02/2022   Long-term current use of benzodiazepine 02/02/2022   Migraine with aura and without status migrainosus, not intractable 11/03/2020   Arrhythmia 08/06/2020   Status migrainosus 02/25/2019   Osteoporosis 04/09/2013   Vitamin D deficiency 04/09/2013   DDD (degenerative disc disease), lumbosacral 02/08/2005   ONSET DATE: 12/18/2023  REFERRING DIAG:    THERAPY DIAG:  Other lack of coordination  Cognitive communication deficit  Muscle weakness (generalized)  Visual impairment  Low vision, both eyes  Rationale for Evaluation and Treatment: Rehabilitation  SUBJECTIVE:  SUBJECTIVE STATEMENT:   Pt. 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  She reports some things are overwhelming with return to work especially since things have moved around and are different since she was last there.  She reports the sections in different places and they redesigned the work space in the front of the store and she feels it may not be as efficient as previously.    Pt accompanied by: significant other  PERTINENT HISTORY: Pt. has Hx of stroke with onset 12/18/2023. Pt. PMHx includes: HTN, Hyperlipidemia, Kidney Stones, near syncopal event, loss of vision.  PRECAUTIONS: None  WEIGHT BEARING RESTRICTIONS: None  PAIN:  Are you having pain? No  FALLS: Has patient fallen in last 6 months? Yes. Number of falls    LIVING ENVIRONMENT: Lives with: lives with their family Lives in: [place], [place] Stairs: No, inside the house, yes but does not use Has following equipment at home:   PLOF: Independent  PATIENT GOALS: To be able to see  OBJECTIVE:   HAND DOMINANCE: Right  ADLs:  Eating: Drinking from straw is different, able to use utensils.  Grooming: Fatigues completing hair care.  UB Dressing: independent, if clothes are in front of her she can find clothing LB Dressing: Independent Toileting: Independent Bathing: Independent Tub Shower transfers: Independent Equipment: none Has difficulty with using cell phone  IADLs: Shopping: Does not typically go out shopping, and has not tried to. Light housekeeping: Pt. reports that she does not typically do house cleaning. Pt. has been able to unpack belongings from boxes due to her recent move in. Meal Prep: Pt. reports that is does not currently cook, however reports  that she probably could. Has difficulty opening bottle caps/lids. Community mobility: Requires assistance from husband to navigate  through buildings, negotiate stairs-Pt. with increased fear of falling  Medication management: Able to push down medication bottles to open  Financial management: No change in the process-uses automatic bill pay system.  Has difficulty with using cell phone Handwriting: N/T Work: Pt. was actively working managing a health visitor, works a lot of hours  MOBILITY STATUS: Independent and Needs Assist: Requires hand on hand assistance with nvigating through environments.   POSTURE COMMENTS:  No Significant postural limitations Sitting balance: Good  ACTIVITY TOLERANCE: Activity tolerance: Good  FUNCTIONAL OUTCOME MEASURES:   UPPER EXTREMITY ROM:    Active ROM Right Eval WFL Left Eval [facility]  Shoulder flexion    Shoulder abduction    Shoulder adduction    Shoulder extension    Shoulder internal rotation    Shoulder external rotation    Elbow flexion    Elbow extension    Wrist flexion    Wrist extension    Wrist ulnar deviation    Wrist radial deviation    Wrist pronation  Wrist supination    (Blank rows = not tested)  UPPER EXTREMITY MMT:     MMT Right eval Right 01/29/24 Right  04/04/24 Left eval Left 01/29/24 Left 04/04/24  Shoulder flexion 4-/5 5/5 5/5 4-/5 5/5 5/5  Shoulder abduction 4-/5 5/5 5/5 4-/5 5/5 5/5  Shoulder adduction        Shoulder extension        Shoulder internal rotation        Shoulder external rotation        Middle trapezius        Lower trapezius        Elbow flexion 5/5 5/5 5/5 5/5 5/5 5/5  Elbow extension 5/5 5/5 5/5 5/5 5/5 5/5  Wrist flexion 4/5 5/5 5/5 4-/5 5/5 5/5  Wrist extension 4/5 5/5 5/5 4-/5 5/5 5/5  Wrist ulnar deviation        Wrist radial deviation        Wrist pronation        Wrist supination        (Blank rows = not tested)  HAND FUNCTION: Grip strength: Right: 39 lbs; Left: 28  lbs, Lateral pinch: Right: 11 lbs, Left: 9 lbs, and 3 point pinch: Right: 9 lbs, Left: 9 lbs  01/29/24 Grip strength: Right: 39 lbs; Left: 36 lbs, Lateral pinch: Right: 14 lbs, Left: 12 lbs, and 3 point pinch: Right: 12 lbs, Left: 11 lbs  04/04/24:  Grip strength: Right: 37 lbs; Left: 42 lbs  05/14/24  Grip strength: Right: 55 lbs; Left: 47 lbs  06/18/24  Grip strength: Right: 57 lbs; Left: 47 lbs Lateral pinch: Right: 15 lbs, Left: 12 lbs, and 3 point pinch: Right: 14 lbs, Left: 11 lbs  COORDINATION:  9 Hole Peg test: Right: 30 sec; Left: 36 sec  01/29/24 9 Hole Peg test: Right: 24 sec; Left: 23 sec.  04/04/24:  9 Hole Peg test: Right: 28 sec; Left: 33 sec.  05/14/24:  9 Hole Peg test: Right: 23 sec; Left: 22 sec.  SENSATION: WFL  EDEMA:   MUSCLE TONE:   COGNITION: Overall cognitive status: Within functional limits for tasks assessed  VISION: Subjective report: Pt. reports changes in her vision at onset of stroke, starting with blurry vision, resulting in vision loss in the R eye.  Baseline vision: Pt. Is able to visually track  Visual history: Hx of eye surgery  VISION ASSESSMENT: TBD  PERCEPTION: WFL  PRAXIS: WFL  OBSERVATIONS:                                                                                                                    TREATMENT DATE: 07/09/24  Self Care/IADL:  Pt seen this date for focus on work tasks in combination with handwriting skills.  Pt making a list of tasks she is responsible for after now returning to work this past week.  Once the list was formulated, we reviewed the list to star any items she feels weary about performing or feels the task is  different than in the past. She also identified tasks which she feels really comfortable performing and feels she will not have any issues.   Tasks which are difficult currently:  Making new tickets for returns (new machine) Store layout (sections have been moved to other locations  since she worked there 6 months ago) Retail banker of employees, especially ones she hasn't worked with in the past or met. The store has a high turnover rate so there are always new people. She feels she is able to recall passcodes to the safe and other procedures however she is unsure if these have changed since she worked last and if she will need to memorize new ones.  She states there are about 9 pass codes of 3-7 numbers each she has to recall and cannot be written down for safety reasons.    She does feel comfortable with things such as the closing process, walking around to inspect the areas to ensure the areas are neat and cleaned and ready for the next day, the process of placing items in the safe and how this task is done.  She is comfortable with the safety protocol when closing, which is checking all the areas of the store, making sure the doors and spaces are all locked and closed and inspecting bags of employees before departure.    Performed task analysis of her work tasks and ways to potentially correct and address any weak areas.  Recommended pt take time over the next few work days to walk the perimeter of the store and when she gets home to draw out a current layout of the store to study, she can add areas each evening until she gets the map laid out in its entirety.  Recommended she reach out to see if someone can be available as a resource on her first night closing alone.  Recommend she ask if she can come in and observe and go over the closing process with another manager before doing it alone.  Recommended pt make sure to address each employee by name as she interacts to get the repetition and be familiar with those she will be working with to interact with them by name when needed.  Recommend she ask the current manager about the pass codes to see if they are the same and if not, take time to work on memorizing them while at work.  As she feels more comfortable with the processes,  she may want to make recommendations on ways to improve efficiency in the work tasks that she currently feels are inefficient.    Handwriting was good and legible this date, she had some mild hand fatigue after writing for prolonged periods.      Use of visual strategies and dual tasking was implemented throughout the session.  Pt took her lists with her so she could review the information we formulated and be able to implement into her work day.    PATIENT EDUCATION:  Compensatory strategies for visual perceptual functioning  HOME EXERCISE PROGRAM:  -Upgraded to blue theraputty for gross grip strengthening. -Green Theraputty exercises for  hand strengthening at bridge -Visual perceptual: Flip flop target design patterns. -Visual memory tasks -Writing tasks -visual motor tasks.  GOALS: Goals reviewed with patient? Yes  SHORT TERM GOALS: Target date: 06/25/2024   Pt. Will independently utilize HEP for hand strength, coordination, and visual compensatory strategies ADL/ADLs Baseline:06/18/24: Independent 05/14/24: Independent 04/12/23: Independent, continue 03/06/24: Independent Eval: No  current HEP  Goal status: Ongoing  LONG TERM GOALS: Target date: 08/06/2024  Pt. Will be able to independently implement visual scanning/visual search compensatory strategies for tasks within her extra personal space navigating through community environments 100% of the time.  Baseline: 01/29/24:  Independent  100% of the time within the therapy gym, and hallway. Eval: Pt. Requires increased assist to navigate through community environments. Goal status: Achieved  2.  Pt. Will be able to independently utilize visual scanning/visual search strategies  during ADLs/IADLs within her near space, and during tabletop tasks, and tasks at a vertical plane with 100% accuracy. Baseline: 06/18/24: 100% simple to complex visual scanning tasks however 75% accuracy for  moderate tocomplex visual scanning tasks at  a horizontal plane tas, and scanning at a vertical plane. Pt. Pt. Is able to visually scan for multiple different items at once with 60% accuracy. 07/15/23: 90% simple to moderately complex visual scanning tasks however 75% accuracy for complex visual scanning tasks , and scanning at a vertical plane. 04/11/24: 80% simple to moderately complex visual scanning tasks however 70% accuracy for complex visual scanning tasks, and scanning at a vertical plane.  03/06/24: Initiates visual scanning 85% of the time  01/29/24: 75% Eval: Pt. Education to be provided.  Goal status: Progressing Ongoing  3.  Pt. Will be able to independently initiate visual scanning techniques in her own environment to reduce risk of falls.  Baseline: 01/29/24: Education was provided, Pt. is utlizing visual compensatory strategies/visual scanning within her home. Eval: Pt. Education to be provided.  Goal status: Achieved  4.  Pt. Will increase BUE Grip Strength by 5# to be able to independently and securely hold objects for ADL/IADL use. Baseline: 05/14/24: Grip strength: Right: 55 lbs; Left: 47 lbs Pt. Has improved with holding items within her hand without dropping them.04/11/24: Continue- recent revision 04/04/2024: Grip strength: Right: 37 lbs; Left: 42 lbs patient is dropping items from her hands 01/29/24: Grip strength: Right: 39 lbs; Left: 28 lbs, Eval: Right Grip: 39#, Left Grip: 28# Goal status: Achieved  5.  Pt. Will increase L Lateral Pinch strength by 2# to be able to open bottles. Baseline: 06/18/24: Lateral pinch: Right: 15 lbs, Left: 12 lbs, and 3 point pinch: Right: 14 lbs, Left: 11 lbs 05/14/24: Pt. is able to open previously opened jars/bottles. Has difficulty with unopened jars/bottles.04/04/2024: Grip strength: Right: 37 lbs; Left: 42 lbs 01/29/24: Lateral pinch: Right: 14 lbs, Left: 12 lbs Eval: Lateral pinch: Right: 11 lbs, Left: 9 lbs  Goal status: Achieved  6.  Pt. Will increase BUE strength for shoulder  flexion/abduction by 2 mm grades to independently complete hair care tasks. Baseline:  03/16/2024: 5/5 01/29/24: 5/5 overall Eval: Right Shoulder Flexion: 4-/5, Left Shoulder Flexion:4-/5, Right Shoulder Abduction: 4-/5, Left Shoulder Abduction: 4-/5 Goal status: Achieved  7.  Pt will be able to independently and efficiently manipulate medication without dropping them.  Baseline: 06/18/24: Pt. Is able to manipulate her medication consistently now without dropping them.05/14/24: 9 Hole Peg test: Right: 23 sec; Left: 22 sec. 04/11/24: Continue, recent revision 04/04/2024: 9 Hole Peg test: Right: 28 sec; Left: 33 sec. 01/29/24: Independent 01/15/24: Daughter currently manages medication set up.  Goal status: Ongoing  8. Pt will increase typing speed to 15 words per minute with at least 90% accuracy to work towards more efficient typing for job related  responsibilities.  Baseline: 06/18/24: 10 wpm with 87% accuracy with 8 typos for 4 sentences.05/14/24: Pt. Continues to present with multiple mistypes, and requires increased time.05/14/24: 04/11/24: TBD 03/06/24:  10 wpm with 92% accuracy. 01/29/24: Continue 01/15/24: 8wpm x88% accuracy=7 wpm  Goal status: Ongoing/Modified to 15 wpm 06/18/24  9. Pt will be able to scan small print on store receipts to check for errors with 100% accuracy using visual compensation strategies as needed.  Baseline: 06/18/24: Independent and efficient with scanning receipts accurately 05/14/24: Pt. Is improving with efficiency of visually scanning receipts.04/11/24: Pt. Continues to present with difficulty with visual scanning small  small items efficiently. 03/06/24: Pt. Continues to present with difficulty with visual scanning small  small items efficiently. 01/29/24: Pt. Continues to have difficulty scanning small print items. 01/15/24: Not yet attempted; required for return to work/job responsibility  Goal status:  Achieved  10. Pt. will independently identify 8 items consistently  from visual visual memory in preparation or ADLs.    Baseline: 06/18/24: 5-7 items depending upon the context of the task, less with with increased environmental stimulation, and distractions. 05/14/24: 5-7 items.04/11/24: 5-7 items  while seated at the tabletop. 03/06/24: Pt. is able to identify up to 6 picture items while seated at the tabletop. Pt. is able to consistently Identify 4 letters from visual memory when attention is divided.  01/29/24: Pt. is able to identify 6 items consistently from visual memory.    Gaol status: Ongoing      11. Pt. will be independently write 4 sentences  efficiently with 100% legibility, no deviation from writing on a blank line, and appropriate spacing between letters.              Baseline: 06/18/24: 4 sentences completed in printed form in 2 min. & 35 sec. With positive deviation above the line.05/14/24: 4 lines with 80% legibility in printed form, completed in 2 min. & 16 sec. 04/11/24: Continue 03/06/24: 4 lines with 75% legibility, positive deviation below the line, and excessive spacing between the words.              Goal status: Ongoing  ASSESSMENT:  CLINICAL IMPRESSION:  Pt reports return to work is challenging and she is still trying to figure out what has changed since she has been out for medical leave and what processes are the same.  She had difficulty at the beginning of the session trying to sort out and discuss what areas where difficult and stated that a lot of things were overwhelming.  Once we formulated a list of tasks and analyzed each one, she felt better and more focused on how to verbalize the tasks which are more problematic currently.  Pt was able to work towards problem solving and open to recommendations from therapist on ways to work through the difficult tasks and how to ask for assistance from other managers.  She has a list of items to work on over the next few work days as outlined above.  We can reassess next session and see how these  strategies worked and continue to utilize task analysis to improve her independence and confidence in work related tasks.  Pt. continues to benefit from Occupational Therapy services to improve her ability to use visual compensatory strategies, and improve overall BUE functioning in order to improve engagement in, and maximize overall independence with ADL, and IADL tasks.        PERFORMANCE DEFICITS: in functional skills including ADLs, IADLs, coordination, dexterity, Fine motor control, and vision, and psychosocial skills including coping strategies, environmental adaptation, habits, interpersonal interactions, and routines and behaviors.   IMPAIRMENTS: are limiting patient from ADLs, IADLs, rest and sleep, work, leisure,  and social participation.   CO-MORBIDITIES: may have co-morbidities  that affects occupational performance. Patient will benefit from skilled OT to address above impairments and improve overall function.  MODIFICATION OR ASSISTANCE TO COMPLETE EVALUATION: Min-Moderate modification of tasks or assist with assess necessary to complete an evaluation.  OT OCCUPATIONAL PROFILE AND HISTORY: Detailed assessment: Review of records and additional review of physical, cognitive, psychosocial history related to current functional performance.  CLINICAL DECISION MAKING: Moderate - several treatment options, min-mod task modification necessary  REHAB POTENTIAL: Good  EVALUATION COMPLEXITY: Moderate    PLAN:  OT FREQUENCY: 2x/week  OT DURATION: 12 weeks  PLANNED INTERVENTIONS: 97168 OT Re-evaluation, 97535 self care/ADL training, 02889 therapeutic exercise, 97530 therapeutic activity, 97112 neuromuscular re-education, visual/perceptual remediation/compensation, patient/family education, and DME and/or AE instructions  RECOMMENDED OTHER SERVICES: ST  CONSULTED AND AGREED WITH PLAN OF CARE: Patient and family member/caregiver  PLAN FOR NEXT SESSION: Treatment  Madine Sarr T Desteny Freeman,  OTR/L, CLT  07/18/24, 10:52 AM    "

## 2024-07-18 NOTE — Therapy (Signed)
 "   Occupational Therapy Treatment Note   Patient Name: Ashley Collins MRN: [id number] DOB:April 06, 1961, 64 y.o., female   PCP: Harvey Gaetana CROME, NP REFERRING PROVIDER: Harvey Gaetana CROME, NP   OT End of Session - 07/18/24 1735     Visit Number 57    Number of Visits 72    Date for Recertification  08/06/24    OT Start Time 1537    OT Stop Time 1625    OT Time Calculation (min) 48 min    Activity Tolerance Patient tolerated treatment well    Behavior During Therapy WFL for tasks assessed/performed               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   Cardiac arrhythmia    Nonspecific ST T wave changes on EKG   Chronic venous insufficiency of lower extremity    Complication of anesthesia    nausea and vomiting   DDD (degenerative disc disease), lumbosacral    Essential hypertension    Headache    History of kidney stones    Hyperlipidemia    Kidney stones    Lymphedema    Migraines    Osteoporosis    PONV (postoperative nausea and vomiting)    Right ureteral stone    Vitamin B12 deficiency    Vitamin D deficiency    Past Surgical History:  Procedure Laterality Date   AUGMENTATION MAMMAPLASTY     CESAREAN SECTION     x 4   COLONOSCOPY WITH PROPOFOL  N/A 03/31/2022   Procedure: COLONOSCOPY WITH PROPOFOL ;  Surgeon: Onita Elspeth Sharper, DO;  Location: [facility] ENDOSCOPY;  Service: Gastroenterology;  Laterality: N/A;   CYSTOSCOPY W/ RETROGRADES Bilateral 07/10/2020   Procedure: CYSTOSCOPY WITH RETROGRADE PYELOGRAM;  Surgeon: Francisca Redell BROCKS, MD;  Location: [facility] ORS;  Service: Urology;  Laterality: Bilateral;   CYSTOSCOPY/URETEROSCOPY/HOLMIUM LASER/STENT PLACEMENT     CYSTOSCOPY/URETEROSCOPY/HOLMIUM LASER/STENT PLACEMENT Bilateral 07/10/2020   Procedure: CYSTOSCOPY/URETEROSCOPY/HOLMIUM LASER/STENT PLACEMENT;  Surgeon: Francisca Redell BROCKS, MD;  Location: [facility] ORS;  Service: Urology;  Laterality:  Bilateral;   CYSTOSCOPY/URETEROSCOPY/HOLMIUM LASER/STENT PLACEMENT Right 08/25/2023   Procedure: CYSTOSCOPY/URETEROSCOPY/HOLMIUM LASER;  Surgeon: Francisca Redell BROCKS, MD;  Location: [facility] ORS;  Service: Urology;  Laterality: Right;   CYSTOSCOPY/URETEROSCOPY/HOLMIUM LASER/STENT PLACEMENT Right 12/15/2023   Procedure: CYSTOSCOPY/URETEROSCOPY/HOLMIUM LASER;  Surgeon: Francisca Redell BROCKS, MD;  Location: [facility] ORS;  Service: Urology;  Laterality: Right;   EXTRACORPOREAL SHOCK WAVE LITHOTRIPSY     x 10 plus   Eye Lift     FACIAL COSMETIC SURGERY     GANGLION CYST EXCISION Right 12/21/2021   Procedure: REMOVAL GANGLION OF WRIST;  Surgeon: Kathlynn Sharper, MD;  Location: [facility] ORS;  Service: Orthopedics;  Laterality: Right;   LIPOSUCTION     TONSILLECTOMY     TRIGGER FINGER RELEASE Right 12/21/2021   Procedure: RELEASE TRIGGER FINGER/A-1 PULLEY;  Surgeon: Kathlynn Sharper, MD;  Location: [facility] ORS;  Service: Orthopedics;  Laterality: Right;   URETEROSCOPY WITH HOLMIUM LASER LITHOTRIPSY     Patient Active Problem List   Diagnosis Date Noted   Acute CVA (cerebrovascular accident) (HCC) 12/19/2023   Hypokalemia 12/19/2023   AKI (acute kidney injury) 12/19/2023   Leukocytosis 12/19/2023   Hyperlipidemia, unspecified 12/19/2023   Essential hypertension 12/19/2023   Vision changes 12/19/2023   Lymphedema 05/22/2023   Chronic venous insufficiency 05/22/2023   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 replacement therapy 04/10/2023  Insomnia due to medical condition 04/25/2022   GAD (generalized anxiety disorder) 02/02/2022   Other specified depressive episodes 02/02/2022   Long-term current use of benzodiazepine 02/02/2022   Migraine with aura and without status migrainosus, not intractable 11/03/2020   Arrhythmia 08/06/2020   Status migrainosus 02/25/2019   Osteoporosis 04/09/2013   Vitamin D deficiency 04/09/2013   DDD (degenerative disc disease), lumbosacral 02/08/2005   ONSET DATE: 12/18/2023  REFERRING DIAG:    THERAPY DIAG:  Muscle weakness (generalized)  Rationale for Evaluation and Treatment: Rehabilitation  SUBJECTIVE:  SUBJECTIVE STATEMENT:   Pt. 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  Pt accompanied by: significant other  PERTINENT HISTORY: Pt. has Hx of stroke with onset 12/18/2023. Pt. PMHx includes: HTN, Hyperlipidemia, Kidney Stones, near syncopal event, loss of vision.  PRECAUTIONS: None  WEIGHT BEARING RESTRICTIONS: None  PAIN:  Are you having pain? No  FALLS: Has patient fallen in last 6 months? Yes. Number of falls    LIVING ENVIRONMENT: Lives with: lives with their family Lives in: [place], [place] Stairs: No, inside the house, yes but does not use Has following equipment at home:   PLOF: Independent  PATIENT GOALS: To be able to see  OBJECTIVE:   HAND DOMINANCE: Right  ADLs:  Eating: Drinking from straw is different, able to use utensils.  Grooming: Fatigues completing hair care.  UB Dressing: independent, if clothes are in front of her she can find clothing LB Dressing: Independent Toileting: Independent Bathing: Independent Tub Shower transfers: Independent Equipment: none Has difficulty with using cell phone  IADLs: Shopping: Does not typically go out shopping, and has not tried to. Light housekeeping: Pt. reports that she does not typically do house cleaning. Pt. has been able to unpack belongings from boxes due to her recent move in. Meal Prep: Pt. reports that is does not currently cook, however reports that she probably could. Has difficulty opening bottle caps/lids. Community mobility: Requires assistance from husband to navigate  through buildings, negotiate stairs-Pt. with increased fear of falling  Medication management: Able to push down medication bottles to open  Financial management: No change in the process-uses automatic bill pay system.  Has difficulty with using cell phone Handwriting:  N/T Work: Pt. was actively working managing a health visitor, works a lot of hours  MOBILITY STATUS: Independent and Needs Assist: Requires hand on hand assistance with nvigating through environments.   POSTURE COMMENTS:  No Significant postural limitations Sitting balance: Good  ACTIVITY TOLERANCE: Activity tolerance: Good  FUNCTIONAL OUTCOME MEASURES:   UPPER EXTREMITY ROM:    Active ROM Right Eval WFL Left Eval [employer]  Shoulder flexion    Shoulder abduction    Shoulder adduction    Shoulder extension    Shoulder internal rotation    Shoulder external rotation    Elbow flexion    Elbow extension    Wrist flexion    Wrist extension    Wrist ulnar deviation    Wrist radial deviation    Wrist pronation    Wrist supination    (Blank rows = not tested)  UPPER EXTREMITY MMT:     MMT Right eval Right 01/29/24 Right  04/04/24 Left eval Left 01/29/24 Left 04/04/24  Shoulder flexion 4-/5 5/5 5/5 4-/5 5/5 5/5  Shoulder abduction 4-/5 5/5 5/5 4-/5 5/5 5/5  Shoulder adduction        Shoulder extension        Shoulder internal rotation  Shoulder external rotation        Middle trapezius        Lower trapezius        Elbow flexion 5/5 5/5 5/5 5/5 5/5 5/5  Elbow extension 5/5 5/5 5/5 5/5 5/5 5/5  Wrist flexion 4/5 5/5 5/5 4-/5 5/5 5/5  Wrist extension 4/5 5/5 5/5 4-/5 5/5 5/5  Wrist ulnar deviation        Wrist radial deviation        Wrist pronation        Wrist supination        (Blank rows = not tested)  HAND FUNCTION: Grip strength: Right: 39 lbs; Left: 28 lbs, Lateral pinch: Right: 11 lbs, Left: 9 lbs, and 3 point pinch: Right: 9 lbs, Left: 9 lbs  01/29/24 Grip strength: Right: 39 lbs; Left: 36 lbs, Lateral pinch: Right: 14 lbs, Left: 12 lbs, and 3 point pinch: Right: 12 lbs, Left: 11 lbs  04/04/24:  Grip strength: Right: 37 lbs; Left: 42 lbs  05/14/24  Grip strength: Right: 55 lbs; Left: 47 lbs  06/18/24  Grip strength: Right: 57 lbs; Left:  47 lbs Lateral pinch: Right: 15 lbs, Left: 12 lbs, and 3 point pinch: Right: 14 lbs, Left: 11 lbs  COORDINATION:  9 Hole Peg test: Right: 30 sec; Left: 36 sec  01/29/24 9 Hole Peg test: Right: 24 sec; Left: 23 sec.  04/04/24:  9 Hole Peg test: Right: 28 sec; Left: 33 sec.  05/14/24:  9 Hole Peg test: Right: 23 sec; Left: 22 sec.  SENSATION: WFL  EDEMA:   MUSCLE TONE:   COGNITION: Overall cognitive status: Within functional limits for tasks assessed  VISION: Subjective report: Pt. reports changes in her vision at onset of stroke, starting with blurry vision, resulting in vision loss in the R eye.  Baseline vision: Pt. Is able to visually track  Visual history: Hx of eye surgery  VISION ASSESSMENT: TBD  PERCEPTION: WFL  PRAXIS: WFL  OBSERVATIONS:                                                                                                                    TREATMENT DATE: 07/18/24  Self-care:  -Reviewed and assessed IADL/work related tasks, and assisted Pt. 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  -Pt. Worked on creating a medical sales representative of work related tasks that are difficult. Pt. Identified the following tasks as difficult: -Navigating all aspects of the [employer] system for handling price returns. Pt. Indicated that this system has a new device haas a new, more detailed system. -Navigating the register efficiently -Prolonged time standing at the register. -Fatigues with pushing, and organizing the carts. -Remembering the names of all the new employees.  -Fatigue after working, and difficulty winding down afterwards with mental fatigue hindering her ability to complete other tasks including bills .  -Assisted Pt. In problem solving through the above work related tasks the  Pt. indicated having difficulty with using task analysis. Pt. Plans to access, and review the  instruction manuals for navigating the register, as well as for navigating through the new [employer] system for handling returns. Pt. Plans to delegate pushing, and organizing the carts to employees, Taking more frequent breaks from the register. Strategies were reviewed to assist Pt. in remembering names.   -Pt. Reports that her husband has been doing most of the house cleaning/home management tasks. Pt. Was encouraged to try engaging in home management tasks gradually increasing her participation in them the assist with increasing her activity tolerance endurance.  -Pt. Education was provided about prioritizing home tasks/bill management tasks, and balancing rest following work.   PATIENT EDUCATION:  Work simplification strategies for IADL work related tasks.  HOME EXERCISE PROGRAM:  Implementing that strategies reviewed for work related tasks  GOALS: Goals reviewed with patient? Yes  SHORT TERM GOALS: Target date: 06/25/2024   Pt. Will independently utilize HEP for hand strength, coordination, and visual compensatory strategies ADL/ADLs Baseline:06/18/24: Independent 05/14/24: Independent 04/12/23: Independent, continue 03/06/24: Independent Eval: No  current HEP  Goal status: Ongoing   LONG TERM GOALS: Target date: 08/06/2024  Pt. Will be able to independently implement visual scanning/visual search compensatory strategies for tasks within her extra personal space navigating through community environments 100% of the time.  Baseline: 01/29/24:  Independent  100% of the time within the therapy gym, and hallway. Eval: Pt. Requires increased assist to navigate through community environments. Goal status: Achieved  2.  Pt. Will be able to independently utilize visual scanning/visual search strategies  during ADLs/IADLs within her near space, and during tabletop tasks, and tasks at a vertical plane with 100% accuracy. Baseline: 06/18/24: 100% simple to complex visual scanning tasks however 75%  accuracy for  moderate tocomplex visual scanning tasks at a horizontal plane tas, and scanning at a vertical plane. Pt. Pt. Is able to visually scan for multiple different items at once with 60% accuracy. 07/15/23: 90% simple to moderately complex visual scanning tasks however 75% accuracy for complex visual scanning tasks , and scanning at a vertical plane. 04/11/24: 80% simple to moderately complex visual scanning tasks however 70% accuracy for complex visual scanning tasks, and scanning at a vertical plane.  03/06/24: Initiates visual scanning 85% of the time  01/29/24: 75% Eval: Pt. Education to be provided.  Goal status: Progressing Ongoing  3.  Pt. Will be able to independently initiate visual scanning techniques in her own environment to reduce risk of falls.  Baseline: 01/29/24: Education was provided, Pt. is utlizing visual compensatory strategies/visual scanning within her home. Eval: Pt. Education to be provided.  Goal status: Achieved  4.  Pt. Will increase BUE Grip Strength by 5# to be able to independently and securely hold objects for ADL/IADL use. Baseline: 05/14/24: Grip strength: Right: 55 lbs; Left: 47 lbs Pt. Has improved with holding items within her hand without dropping them.04/11/24: Continue- recent revision 04/04/2024: Grip strength: Right: 37 lbs; Left: 42 lbs patient is dropping items from her hands 01/29/24: Grip strength: Right: 39 lbs; Left: 28 lbs, Eval: Right Grip: 39#, Left Grip: 28# Goal status: Achieved  5.  Pt. Will increase L Lateral Pinch strength by 2# to be able to open bottles. Baseline: 06/18/24: Lateral pinch: Right: 15 lbs, Left: 12 lbs, and 3 point pinch: Right: 14 lbs, Left: 11 lbs 05/14/24: Pt. is able to open previously opened jars/bottles. Has difficulty with unopened jars/bottles.04/04/2024: Grip strength: Right: 37 lbs; Left: 42 lbs  01/29/24: Lateral pinch: Right: 14 lbs, Left: 12 lbs Eval: Lateral pinch: Right: 11 lbs, Left: 9 lbs  Goal status: Achieved  6.   Pt. Will increase BUE strength for shoulder flexion/abduction by 2 mm grades to independently complete hair care tasks. Baseline:  03/16/2024: 5/5 01/29/24: 5/5 overall Eval: Right Shoulder Flexion: 4-/5, Left Shoulder Flexion:4-/5, Right Shoulder Abduction: 4-/5, Left Shoulder Abduction: 4-/5 Goal status: Achieved  7.  Pt will be able to independently and efficiently manipulate medication without dropping them.  Baseline: 06/18/24: Pt. Is able to manipulate her medication consistently now without dropping them.05/14/24: 9 Hole Peg test: Right: 23 sec; Left: 22 sec. 04/11/24: Continue, recent revision 04/04/2024: 9 Hole Peg test: Right: 28 sec; Left: 33 sec. 01/29/24: Independent 01/15/24: Daughter currently manages medication set up.  Goal status: Ongoing  8. Pt will increase typing speed to 15 words per minute with at least 90% accuracy to work towards more efficient typing for job related  responsibilities.  Baseline: 06/18/24: 10 wpm with 87% accuracy with 8 typos for 4 sentences.05/14/24: Pt. Continues to present with multiple mistypes, and requires increased time.05/14/24: 04/11/24: TBD 03/06/24: 10 wpm with 92% accuracy. 01/29/24: Continue 01/15/24: 8wpm x88% accuracy=7 wpm  Goal status: Ongoing/Modified to 15 wpm 06/18/24  9. Pt will be able to scan small print on store receipts to check for errors with 100% accuracy using visual compensation strategies as needed.  Baseline: 06/18/24: Independent and efficient with scanning receipts accurately 05/14/24: Pt. Is improving with efficiency of visually scanning receipts.04/11/24: Pt. Continues to present with difficulty with visual scanning small  small items efficiently. 03/06/24: Pt. Continues to present with difficulty with visual scanning small  small items efficiently. 01/29/24: Pt. Continues to have difficulty scanning small print items. 01/15/24: Not yet attempted; required for return to work/job responsibility  Goal status:  Achieved  10. Pt. will  independently identify 8 items consistently from visual visual memory in preparation or ADLs.    Baseline: 06/18/24: 5-7 items depending upon the context of the task, less with with increased environmental stimulation, and distractions. 05/14/24: 5-7 items.04/11/24: 5-7 items  while seated at the tabletop. 03/06/24: Pt. is able to identify up to 6 picture items while seated at the tabletop. Pt. is able to consistently Identify 4 letters from visual memory when attention is divided.  01/29/24: Pt. is able to identify 6 items consistently from visual memory.    Gaol status: Ongoing      11. Pt. will be independently write 4 sentences  efficiently with 100% legibility, no deviation from writing on a blank line, and appropriate spacing between letters.              Baseline: 06/18/24: 4 sentences completed in printed form in 2 min. & 35 sec. With positive deviation above the line.05/14/24: 4 lines with 80% legibility in printed form, completed in 2 min. & 16 sec. 04/11/24: Continue 03/06/24: 4 lines with 75% legibility, positive deviation below the line, and excessive spacing between the words.              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 Pt. Reports that it went better than she thought it would, however indicated having difficulty with navigating all aspects of the [employer] system for handling price returns, Navigating the register efficiently, Difficulty with prolonged time standing at the register, fatigue with pushing, and organizing the carts, difficulty remembering the names of all  the new employees, fatigue after working, and difficulty winding down afterwards with mental fatigue hindering her ability to complete other tasks including bills. Pt. was able to identify strategies to try to implement to assist with tasks identified. Plan follow-up regarding these strategies at the next visit, and continues to assist with  problem solving, and task analysis to promote success with IADL/work related tasks. Pt. continues to benefit from Occupational Therapy services to improve her ability to use visual compensatory strategies, and improve overall BUE functioning in order to improve engagement in, and maximize overall independence with ADL, and IADL tasks.        PERFORMANCE DEFICITS: in functional skills including ADLs, IADLs, coordination, dexterity, Fine motor control, and vision, and psychosocial skills including coping strategies, environmental adaptation, habits, interpersonal interactions, and routines and behaviors.   IMPAIRMENTS: are limiting patient from ADLs, IADLs, rest and sleep, work, leisure, and social participation.   CO-MORBIDITIES: may have co-morbidities  that affects occupational performance. Patient will benefit from skilled OT to address above impairments and improve overall function.  MODIFICATION OR ASSISTANCE TO COMPLETE EVALUATION: Min-Moderate modification of tasks or assist with assess necessary to complete an evaluation.  OT OCCUPATIONAL PROFILE AND HISTORY: Detailed assessment: Review of records and additional review of physical, cognitive, psychosocial history related to current functional performance.  CLINICAL DECISION MAKING: Moderate - several treatment options, min-mod task modification necessary  REHAB POTENTIAL: Good  EVALUATION COMPLEXITY: Moderate    PLAN:  OT FREQUENCY: 2x/week  OT DURATION: 12 weeks  PLANNED INTERVENTIONS: 97168 OT Re-evaluation, 97535 self care/ADL training, 02889 therapeutic exercise, 97530 therapeutic activity, 97112 neuromuscular re-education, visual/perceptual remediation/compensation, patient/family education, and DME and/or AE instructions  RECOMMENDED OTHER SERVICES: ST  CONSULTED AND AGREED WITH PLAN OF CARE: Patient and family member/caregiver  PLAN FOR NEXT SESSION: Treatment  Richardson Otter, MS, OTR/L   07/18/24, 5:41 PM    "

## 2024-07-23 ENCOUNTER — Ambulatory Visit: Admitting: Physical Therapy

## 2024-07-23 ENCOUNTER — Ambulatory Visit

## 2024-07-23 ENCOUNTER — Ambulatory Visit: Admitting: Occupational Therapy

## 2024-07-25 ENCOUNTER — Ambulatory Visit: Admitting: Occupational Therapy

## 2024-07-25 ENCOUNTER — Ambulatory Visit: Admitting: Physical Therapy

## 2024-07-25 ENCOUNTER — Ambulatory Visit

## 2024-07-25 NOTE — Progress Notes (Signed)
" °  Ashley Collins is a 64 y.o. 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 Repeat HVF 24-2  "

## 2024-07-30 ENCOUNTER — Ambulatory Visit: Admitting: Physical Therapy

## 2024-07-30 ENCOUNTER — Ambulatory Visit

## 2024-07-30 ENCOUNTER — Ambulatory Visit: Admitting: Occupational Therapy

## 2024-08-01 ENCOUNTER — Ambulatory Visit: Admitting: Physical Therapy

## 2024-08-01 ENCOUNTER — Ambulatory Visit

## 2024-08-01 ENCOUNTER — Ambulatory Visit: Admitting: Occupational Therapy

## 2024-08-06 ENCOUNTER — Ambulatory Visit: Admitting: Occupational Therapy

## 2024-08-06 ENCOUNTER — Ambulatory Visit: Admitting: Physical Therapy

## 2024-08-06 ENCOUNTER — Ambulatory Visit

## 2024-08-08 ENCOUNTER — Ambulatory Visit: Admitting: Occupational Therapy

## 2024-08-08 ENCOUNTER — Ambulatory Visit

## 2024-08-08 ENCOUNTER — Ambulatory Visit: Admitting: Physical Therapy

## 2024-08-13 ENCOUNTER — Ambulatory Visit: Admitting: Occupational Therapy

## 2024-08-13 ENCOUNTER — Ambulatory Visit: Admitting: Physical Therapy

## 2024-08-13 ENCOUNTER — Ambulatory Visit

## 2024-08-15 ENCOUNTER — Ambulatory Visit

## 2024-08-15 ENCOUNTER — Ambulatory Visit: Admitting: Occupational Therapy

## 2024-08-15 ENCOUNTER — Ambulatory Visit: Admitting: Physical Therapy

## 2024-08-20 ENCOUNTER — Ambulatory Visit

## 2024-08-20 ENCOUNTER — Ambulatory Visit: Admitting: Physical Therapy

## 2024-08-20 ENCOUNTER — Ambulatory Visit: Admitting: Occupational Therapy

## 2024-08-22 ENCOUNTER — Ambulatory Visit

## 2024-08-22 ENCOUNTER — Ambulatory Visit: Admitting: Occupational Therapy

## 2024-08-27 ENCOUNTER — Ambulatory Visit

## 2024-08-27 ENCOUNTER — Ambulatory Visit: Admitting: Physical Therapy

## 2024-08-29 ENCOUNTER — Ambulatory Visit

## 2024-08-29 ENCOUNTER — Ambulatory Visit: Admitting: Physical Therapy

## 2024-08-29 ENCOUNTER — Ambulatory Visit: Admitting: Occupational Therapy

## 2024-09-03 ENCOUNTER — Ambulatory Visit: Admitting: Physical Therapy

## 2024-09-03 ENCOUNTER — Ambulatory Visit: Admitting: Occupational Therapy

## 2024-09-03 ENCOUNTER — Ambulatory Visit

## 2024-09-04 ENCOUNTER — Telehealth: Admitting: Psychiatry

## 2024-09-05 ENCOUNTER — Ambulatory Visit: Admitting: Physical Therapy

## 2024-09-05 ENCOUNTER — Ambulatory Visit

## 2024-09-05 ENCOUNTER — Ambulatory Visit: Admitting: Occupational Therapy

## 2024-09-10 ENCOUNTER — Ambulatory Visit: Admitting: Physical Therapy

## 2024-09-10 ENCOUNTER — Ambulatory Visit

## 2024-09-10 ENCOUNTER — Ambulatory Visit: Admitting: Occupational Therapy

## 2024-09-12 ENCOUNTER — Ambulatory Visit: Admitting: Physical Therapy

## 2024-09-12 ENCOUNTER — Ambulatory Visit: Admitting: Occupational Therapy

## 2024-09-12 ENCOUNTER — Ambulatory Visit

## 2024-09-17 ENCOUNTER — Ambulatory Visit: Admitting: Occupational Therapy

## 2024-09-17 ENCOUNTER — Ambulatory Visit: Admitting: Physical Therapy

## 2024-09-17 ENCOUNTER — Ambulatory Visit

## 2024-09-19 ENCOUNTER — Ambulatory Visit

## 2024-09-19 ENCOUNTER — Ambulatory Visit: Admitting: Occupational Therapy

## 2024-09-19 ENCOUNTER — Ambulatory Visit: Admitting: Physical Therapy

## 2024-10-30 ENCOUNTER — Ambulatory Visit: Admitting: Urology

## 2024-10-31 ENCOUNTER — Ambulatory Visit: Admitting: Urology
